# Patient Record
Sex: Female | Born: 1935 | Race: White | Hispanic: No | Smoking: Never smoker
Health system: Southern US, Community
[De-identification: ages and names within clinical notes are randomized; demographics above are authoritative.]

## PROBLEM LIST (undated history)

## (undated) DIAGNOSIS — T887XXA Unspecified adverse effect of drug or medicament, initial encounter: Secondary | ICD-10-CM

## (undated) DIAGNOSIS — Z800 Family history of malignant neoplasm of digestive organs: Secondary | ICD-10-CM

## (undated) DIAGNOSIS — J209 Acute bronchitis, unspecified: Secondary | ICD-10-CM

## (undated) DIAGNOSIS — K591 Functional diarrhea: Secondary | ICD-10-CM

## (undated) DIAGNOSIS — I498 Other specified cardiac arrhythmias: Secondary | ICD-10-CM

## (undated) DIAGNOSIS — E785 Hyperlipidemia, unspecified: Secondary | ICD-10-CM

## (undated) DIAGNOSIS — K219 Gastro-esophageal reflux disease without esophagitis: Secondary | ICD-10-CM

## (undated) DIAGNOSIS — K5730 Diverticulosis of large intestine without perforation or abscess without bleeding: Secondary | ICD-10-CM

## (undated) DIAGNOSIS — R112 Nausea with vomiting, unspecified: Secondary | ICD-10-CM

## (undated) DIAGNOSIS — E119 Type 2 diabetes mellitus without complications: Secondary | ICD-10-CM

## (undated) DIAGNOSIS — R000 Tachycardia, unspecified: Secondary | ICD-10-CM

## (undated) DIAGNOSIS — I10 Essential (primary) hypertension: Secondary | ICD-10-CM

## (undated) DIAGNOSIS — F3289 Other specified depressive episodes: Secondary | ICD-10-CM

## (undated) DIAGNOSIS — Z86010 Personal history of colonic polyps: Secondary | ICD-10-CM

## (undated) DIAGNOSIS — I4891 Unspecified atrial fibrillation: Secondary | ICD-10-CM

## (undated) DIAGNOSIS — R0789 Other chest pain: Secondary | ICD-10-CM

## (undated) DIAGNOSIS — N390 Urinary tract infection, site not specified: Secondary | ICD-10-CM

## (undated) DIAGNOSIS — Z9889 Other specified postprocedural states: Secondary | ICD-10-CM

## (undated) DIAGNOSIS — Z9289 Personal history of other medical treatment: Secondary | ICD-10-CM

## (undated) DIAGNOSIS — M069 Rheumatoid arthritis, unspecified: Secondary | ICD-10-CM

## (undated) DIAGNOSIS — D649 Anemia, unspecified: Secondary | ICD-10-CM

## (undated) DIAGNOSIS — F329 Major depressive disorder, single episode, unspecified: Secondary | ICD-10-CM

## (undated) DIAGNOSIS — K449 Diaphragmatic hernia without obstruction or gangrene: Secondary | ICD-10-CM

## (undated) DIAGNOSIS — E039 Hypothyroidism, unspecified: Secondary | ICD-10-CM

## (undated) HISTORY — PX: FOREARM FRACTURE SURGERY: SHX649

## (undated) HISTORY — DX: Acute bronchitis, unspecified: J20.9

## (undated) HISTORY — PX: ABDOMINAL HYSTERECTOMY: SHX81

## (undated) HISTORY — DX: Unspecified adverse effect of drug or medicament, initial encounter: T88.7XXA

## (undated) HISTORY — DX: Diverticulosis of large intestine without perforation or abscess without bleeding: K57.30

## (undated) HISTORY — DX: Hyperlipidemia, unspecified: E78.5

## (undated) HISTORY — DX: Anemia, unspecified: D64.9

## (undated) HISTORY — DX: Other specified cardiac arrhythmias: I49.8

## (undated) HISTORY — PX: FRACTURE SURGERY: SHX138

## (undated) HISTORY — DX: Essential (primary) hypertension: I10

## (undated) HISTORY — DX: Other chest pain: R07.89

## (undated) HISTORY — DX: Gastro-esophageal reflux disease without esophagitis: K21.9

## (undated) HISTORY — DX: Family history of malignant neoplasm of digestive organs: Z80.0

## (undated) HISTORY — DX: Other specified depressive episodes: F32.89

## (undated) HISTORY — DX: Tachycardia, unspecified: R00.0

## (undated) HISTORY — PX: CATARACT EXTRACTION: SUR2

## (undated) HISTORY — DX: Personal history of colonic polyps: Z86.010

## (undated) HISTORY — DX: Major depressive disorder, single episode, unspecified: F32.9

## (undated) HISTORY — DX: Functional diarrhea: K59.1

## (undated) HISTORY — DX: Hypothyroidism, unspecified: E03.9

## (undated) HISTORY — PX: THYROIDECTOMY, PARTIAL: SHX18

## (undated) HISTORY — DX: Diaphragmatic hernia without obstruction or gangrene: K44.9

---

## 1995-07-17 DIAGNOSIS — Z860100 Personal history of colon polyps, unspecified: Secondary | ICD-10-CM

## 1995-07-17 HISTORY — DX: Personal history of colonic polyps: Z86.010

## 1995-07-17 HISTORY — DX: Personal history of colon polyps, unspecified: Z86.0100

## 1998-10-21 ENCOUNTER — Inpatient Hospital Stay (HOSPITAL_COMMUNITY): Admission: EM | Admit: 1998-10-21 | Discharge: 1998-10-25 | Payer: Self-pay | Admitting: Gastroenterology

## 1999-12-04 ENCOUNTER — Other Ambulatory Visit: Admission: RE | Admit: 1999-12-04 | Discharge: 1999-12-04 | Payer: Self-pay | Admitting: Family Medicine

## 2000-10-30 ENCOUNTER — Other Ambulatory Visit: Admission: RE | Admit: 2000-10-30 | Discharge: 2000-10-30 | Payer: Self-pay | Admitting: Family Medicine

## 2000-12-24 ENCOUNTER — Encounter: Admission: RE | Admit: 2000-12-24 | Discharge: 2000-12-24 | Payer: Self-pay | Admitting: *Deleted

## 2000-12-24 ENCOUNTER — Encounter: Payer: Self-pay | Admitting: *Deleted

## 2003-06-10 ENCOUNTER — Encounter: Payer: Self-pay | Admitting: Emergency Medicine

## 2003-06-10 ENCOUNTER — Inpatient Hospital Stay (HOSPITAL_COMMUNITY): Admission: AD | Admit: 2003-06-10 | Discharge: 2003-06-11 | Payer: Self-pay | Admitting: Emergency Medicine

## 2003-06-10 ENCOUNTER — Encounter: Payer: Self-pay | Admitting: Internal Medicine

## 2004-08-03 ENCOUNTER — Ambulatory Visit: Payer: Self-pay | Admitting: Family Medicine

## 2005-02-14 ENCOUNTER — Other Ambulatory Visit: Admission: RE | Admit: 2005-02-14 | Discharge: 2005-02-14 | Payer: Self-pay | Admitting: Family Medicine

## 2005-02-14 ENCOUNTER — Ambulatory Visit: Payer: Self-pay | Admitting: Family Medicine

## 2005-02-14 LAB — CONVERTED CEMR LAB

## 2005-03-02 ENCOUNTER — Ambulatory Visit: Payer: Self-pay | Admitting: Internal Medicine

## 2005-03-15 ENCOUNTER — Ambulatory Visit: Payer: Self-pay | Admitting: Family Medicine

## 2005-07-11 ENCOUNTER — Ambulatory Visit: Payer: Self-pay | Admitting: Family Medicine

## 2005-09-25 ENCOUNTER — Ambulatory Visit: Payer: Self-pay | Admitting: Family Medicine

## 2005-10-24 ENCOUNTER — Ambulatory Visit: Payer: Self-pay | Admitting: Family Medicine

## 2006-02-27 ENCOUNTER — Ambulatory Visit: Payer: Self-pay | Admitting: Family Medicine

## 2006-07-02 ENCOUNTER — Ambulatory Visit: Payer: Self-pay | Admitting: Family Medicine

## 2006-07-19 ENCOUNTER — Ambulatory Visit: Payer: Self-pay | Admitting: Family Medicine

## 2006-08-20 ENCOUNTER — Ambulatory Visit: Payer: Self-pay | Admitting: Family Medicine

## 2006-10-01 ENCOUNTER — Ambulatory Visit: Payer: Self-pay | Admitting: Family Medicine

## 2006-10-01 LAB — CONVERTED CEMR LAB
Basophils Absolute: 0 10*3/uL (ref 0.0–0.1)
Basophils Relative: 0.1 % (ref 0.0–1.0)
Creatinine,U: 86.3 mg/dL
Eosinophil percent: 1.8 % (ref 0.0–5.0)
HCT: 39.3 % (ref 36.0–46.0)
Hgb A1c MFr Bld: 6.4 % — ABNORMAL HIGH (ref 4.6–6.0)
Lymphocytes Relative: 10.1 % — ABNORMAL LOW (ref 12.0–46.0)
MCV: 91.1 fL (ref 78.0–100.0)
Monocytes Relative: 8 % (ref 3.0–11.0)
Platelets: 294 10*3/uL (ref 150–400)
RBC: 4.32 M/uL (ref 3.87–5.11)
WBC: 7.2 10*3/uL (ref 4.5–10.5)

## 2007-02-20 ENCOUNTER — Ambulatory Visit: Payer: Self-pay | Admitting: Family Medicine

## 2007-02-27 ENCOUNTER — Ambulatory Visit: Payer: Self-pay | Admitting: Family Medicine

## 2007-02-27 LAB — CONVERTED CEMR LAB
Albumin: 3.2 g/dL — ABNORMAL LOW (ref 3.5–5.2)
Bilirubin, Direct: 0.1 mg/dL (ref 0.0–0.3)
CO2: 29 meq/L (ref 19–32)
Calcium: 9.2 mg/dL (ref 8.4–10.5)
Chloride: 101 meq/L (ref 96–112)
Creatinine, Ser: 0.7 mg/dL (ref 0.4–1.2)
Direct LDL: 59.2 mg/dL
Eosinophils Absolute: 0.2 10*3/uL (ref 0.0–0.6)
GFR calc non Af Amer: 86 mL/min
HDL: 58.9 mg/dL (ref >39.0)
Hgb A1c MFr Bld: 6.5 % — ABNORMAL HIGH (ref 4.6–6.0)
Lymphocytes Relative: 13.8 % (ref 12.0–46.0)
MCHC: 34.1 g/dL (ref 30.0–36.0)
Neutro Abs: 5.5 10*3/uL (ref 1.4–7.7)
Neutrophils Relative %: 78.8 % — ABNORMAL HIGH (ref 43.0–77.0)
Sodium: 137 meq/L (ref 135–145)
Total Bilirubin: 0.6 mg/dL (ref 0.3–1.2)
Total CHOL/HDL Ratio: 2.6
Total Protein: 6.9 g/dL (ref 6.0–8.3)
VLDL: 42 mg/dL — ABNORMAL HIGH (ref 0–40)
WBC: 7 10*3/uL (ref 4.5–10.5)

## 2007-03-06 ENCOUNTER — Other Ambulatory Visit: Admission: RE | Admit: 2007-03-06 | Discharge: 2007-03-06 | Payer: Self-pay | Admitting: Family Medicine

## 2007-03-06 ENCOUNTER — Encounter: Payer: Self-pay | Admitting: Family Medicine

## 2007-03-06 ENCOUNTER — Ambulatory Visit: Payer: Self-pay | Admitting: Family Medicine

## 2007-03-06 DIAGNOSIS — E785 Hyperlipidemia, unspecified: Secondary | ICD-10-CM | POA: Insufficient documentation

## 2007-03-06 DIAGNOSIS — E039 Hypothyroidism, unspecified: Secondary | ICD-10-CM

## 2007-03-06 DIAGNOSIS — M05742 Rheumatoid arthritis with rheumatoid factor of left hand without organ or systems involvement: Secondary | ICD-10-CM

## 2007-03-06 DIAGNOSIS — M05741 Rheumatoid arthritis with rheumatoid factor of right hand without organ or systems involvement: Secondary | ICD-10-CM

## 2007-03-06 DIAGNOSIS — E1169 Type 2 diabetes mellitus with other specified complication: Secondary | ICD-10-CM

## 2007-03-06 DIAGNOSIS — K5730 Diverticulosis of large intestine without perforation or abscess without bleeding: Secondary | ICD-10-CM | POA: Insufficient documentation

## 2007-03-06 DIAGNOSIS — I10 Essential (primary) hypertension: Secondary | ICD-10-CM | POA: Insufficient documentation

## 2007-03-06 LAB — CONVERTED CEMR LAB: Pap Smear: NEGATIVE

## 2007-03-11 LAB — CONVERTED CEMR LAB: Pap Smear: NORMAL

## 2007-04-03 ENCOUNTER — Encounter: Admission: RE | Admit: 2007-04-03 | Discharge: 2007-04-03 | Payer: Self-pay | Admitting: Family Medicine

## 2007-04-03 DIAGNOSIS — R930 Abnormal findings on diagnostic imaging of skull and head, not elsewhere classified: Secondary | ICD-10-CM

## 2007-04-14 ENCOUNTER — Ambulatory Visit: Payer: Self-pay | Admitting: Family Medicine

## 2007-04-24 ENCOUNTER — Encounter: Payer: Self-pay | Admitting: Family Medicine

## 2007-05-08 ENCOUNTER — Telehealth: Payer: Self-pay | Admitting: Family Medicine

## 2007-05-13 ENCOUNTER — Ambulatory Visit: Payer: Self-pay | Admitting: Family Medicine

## 2007-05-15 ENCOUNTER — Encounter: Payer: Self-pay | Admitting: Family Medicine

## 2007-06-16 ENCOUNTER — Encounter: Payer: Self-pay | Admitting: Family Medicine

## 2007-06-24 ENCOUNTER — Encounter: Payer: Self-pay | Admitting: Family Medicine

## 2007-06-24 ENCOUNTER — Telehealth: Payer: Self-pay | Admitting: Family Medicine

## 2007-07-15 ENCOUNTER — Telehealth: Payer: Self-pay | Admitting: Family Medicine

## 2007-07-16 ENCOUNTER — Telehealth: Payer: Self-pay | Admitting: Family Medicine

## 2007-07-16 ENCOUNTER — Ambulatory Visit: Payer: Self-pay | Admitting: Family Medicine

## 2007-07-16 DIAGNOSIS — T887XXA Unspecified adverse effect of drug or medicament, initial encounter: Secondary | ICD-10-CM | POA: Insufficient documentation

## 2007-07-18 ENCOUNTER — Encounter: Admission: RE | Admit: 2007-07-18 | Discharge: 2007-07-18 | Payer: Self-pay | Admitting: Family Medicine

## 2007-07-22 ENCOUNTER — Telehealth: Payer: Self-pay | Admitting: Family Medicine

## 2007-07-23 LAB — CONVERTED CEMR LAB
BUN: 15 mg/dL (ref 6–23)
Creatinine, Ser: 0.7 mg/dL (ref 0.4–1.2)

## 2007-08-01 ENCOUNTER — Ambulatory Visit: Payer: Self-pay | Admitting: Family Medicine

## 2007-09-29 ENCOUNTER — Encounter: Payer: Self-pay | Admitting: Family Medicine

## 2007-10-25 ENCOUNTER — Encounter: Payer: Self-pay | Admitting: Internal Medicine

## 2007-10-25 ENCOUNTER — Ambulatory Visit: Payer: Self-pay | Admitting: Internal Medicine

## 2007-10-25 ENCOUNTER — Observation Stay (HOSPITAL_COMMUNITY): Admission: EM | Admit: 2007-10-25 | Discharge: 2007-10-26 | Payer: Self-pay | Admitting: Emergency Medicine

## 2007-10-25 DIAGNOSIS — R0789 Other chest pain: Secondary | ICD-10-CM

## 2007-10-25 LAB — CONVERTED CEMR LAB
BUN: 16 mg/dL
CO2: 26 meq/L
Calcium: 8.8 mg/dL
Creatinine, Ser: 0.78 mg/dL
Platelets: 300 10*3/uL
Relative Index: 1.9
WBC: 10 10*3/uL

## 2007-10-27 ENCOUNTER — Ambulatory Visit: Payer: Self-pay | Admitting: Gastroenterology

## 2007-10-28 ENCOUNTER — Telehealth (INDEPENDENT_AMBULATORY_CARE_PROVIDER_SITE_OTHER): Payer: Self-pay | Admitting: *Deleted

## 2007-10-29 ENCOUNTER — Ambulatory Visit: Payer: Self-pay | Admitting: Family Medicine

## 2007-10-29 DIAGNOSIS — I498 Other specified cardiac arrhythmias: Secondary | ICD-10-CM

## 2007-10-29 DIAGNOSIS — R000 Tachycardia, unspecified: Secondary | ICD-10-CM

## 2007-10-30 ENCOUNTER — Telehealth: Payer: Self-pay | Admitting: Family Medicine

## 2007-11-04 ENCOUNTER — Telehealth: Payer: Self-pay | Admitting: Family Medicine

## 2007-11-11 ENCOUNTER — Ambulatory Visit: Payer: Self-pay

## 2007-11-11 ENCOUNTER — Encounter: Payer: Self-pay | Admitting: Family Medicine

## 2007-11-19 ENCOUNTER — Telehealth: Payer: Self-pay | Admitting: Family Medicine

## 2007-12-16 ENCOUNTER — Telehealth: Payer: Self-pay | Admitting: Family Medicine

## 2008-03-04 ENCOUNTER — Ambulatory Visit: Payer: Self-pay | Admitting: Family Medicine

## 2008-03-04 LAB — CONVERTED CEMR LAB
Bilirubin Urine: NEGATIVE
Nitrite: NEGATIVE
Specific Gravity, Urine: 1.02

## 2008-03-11 ENCOUNTER — Ambulatory Visit: Payer: Self-pay | Admitting: Family Medicine

## 2008-03-12 LAB — CONVERTED CEMR LAB
ALT: 12 units/L (ref 0–35)
Alkaline Phosphatase: 61 units/L (ref 39–117)
Basophils Absolute: 0 10*3/uL (ref 0.0–0.1)
Basophils Relative: 0.1 % (ref 0.0–1.0)
CO2: 26 meq/L (ref 19–32)
Chloride: 102 meq/L (ref 96–112)
Cholesterol: 154 mg/dL (ref 0–200)
Creatinine,U: 92.6 mg/dL
Eosinophils Absolute: 0.2 10*3/uL (ref 0.0–0.7)
GFR calc Af Amer: 89 mL/min
GFR calc non Af Amer: 74 mL/min
HDL: 57.5 mg/dL (ref >39.0)
LDL Cholesterol: 72 mg/dL (ref 0–99)
MCHC: 34.5 g/dL (ref 30.0–36.0)
MCV: 94 fL (ref 78.0–100.0)
Microalb Creat Ratio: 3.2 mg/g (ref 0.0–30.0)
Microalb, Ur: 0.3 mg/dL (ref 0.0–1.9)
Monocytes Absolute: 0.1 10*3/uL (ref 0.1–1.0)
Neutrophils Relative %: 84.6 % — ABNORMAL HIGH (ref 43.0–77.0)
Potassium: 3.9 meq/L (ref 3.5–5.1)
RBC: 3.84 M/uL — ABNORMAL LOW (ref 3.87–5.11)
TSH: 2.85 microintl units/mL (ref 0.35–5.50)
Total CHOL/HDL Ratio: 2.7
Triglycerides: 124 mg/dL (ref 0–149)
VLDL: 25 mg/dL (ref 0–40)
WBC: 6.6 10*3/uL (ref 4.5–10.5)

## 2008-03-31 ENCOUNTER — Telehealth: Payer: Self-pay | Admitting: Family Medicine

## 2008-05-17 ENCOUNTER — Encounter: Payer: Self-pay | Admitting: Family Medicine

## 2008-05-25 ENCOUNTER — Ambulatory Visit: Payer: Self-pay | Admitting: Gastroenterology

## 2008-06-07 ENCOUNTER — Ambulatory Visit: Payer: Self-pay | Admitting: Gastroenterology

## 2008-06-29 ENCOUNTER — Ambulatory Visit: Payer: Self-pay | Admitting: Family Medicine

## 2008-08-11 ENCOUNTER — Telehealth: Payer: Self-pay | Admitting: Family Medicine

## 2008-08-17 ENCOUNTER — Telehealth (INDEPENDENT_AMBULATORY_CARE_PROVIDER_SITE_OTHER): Payer: Self-pay | Admitting: *Deleted

## 2008-08-23 ENCOUNTER — Ambulatory Visit: Payer: Self-pay | Admitting: Family Medicine

## 2008-08-23 DIAGNOSIS — J209 Acute bronchitis, unspecified: Secondary | ICD-10-CM

## 2008-10-05 ENCOUNTER — Telehealth: Payer: Self-pay | Admitting: Family Medicine

## 2008-11-03 ENCOUNTER — Ambulatory Visit: Payer: Self-pay | Admitting: Family Medicine

## 2008-11-03 DIAGNOSIS — E1169 Type 2 diabetes mellitus with other specified complication: Secondary | ICD-10-CM

## 2008-11-03 LAB — CONVERTED CEMR LAB
Basophils Relative: 0 % (ref 0.0–3.0)
Eosinophils Absolute: 0.1 10*3/uL (ref 0.0–0.7)
Eosinophils Relative: 1.7 % (ref 0.0–5.0)
Glucose, Bld: 198 mg/dL — ABNORMAL HIGH (ref 70–99)
Lymphocytes Relative: 10.5 % — ABNORMAL LOW (ref 12.0–46.0)
MCHC: 33.8 g/dL (ref 30.0–36.0)
MCV: 94 fL (ref 78.0–100.0)
Monocytes Absolute: 0.3 10*3/uL (ref 0.1–1.0)
Monocytes Relative: 4.8 % (ref 3.0–12.0)
Neutrophils Relative %: 83 % — ABNORMAL HIGH (ref 43.0–77.0)
Platelets: 243 10*3/uL (ref 150–400)
RBC: 3.73 M/uL — ABNORMAL LOW (ref 3.87–5.11)

## 2008-12-09 ENCOUNTER — Telehealth: Payer: Self-pay | Admitting: Family Medicine

## 2009-01-04 ENCOUNTER — Telehealth: Payer: Self-pay | Admitting: Family Medicine

## 2009-01-20 ENCOUNTER — Telehealth: Payer: Self-pay | Admitting: Family Medicine

## 2009-02-25 ENCOUNTER — Encounter (HOSPITAL_BASED_OUTPATIENT_CLINIC_OR_DEPARTMENT_OTHER): Admission: RE | Admit: 2009-02-25 | Discharge: 2009-05-26 | Payer: Self-pay | Admitting: General Surgery

## 2009-03-31 ENCOUNTER — Ambulatory Visit: Payer: Self-pay | Admitting: Family Medicine

## 2009-03-31 DIAGNOSIS — K591 Functional diarrhea: Secondary | ICD-10-CM

## 2009-03-31 DIAGNOSIS — F329 Major depressive disorder, single episode, unspecified: Secondary | ICD-10-CM

## 2009-03-31 LAB — CONVERTED CEMR LAB
Bilirubin Urine: NEGATIVE
Blood in Urine, dipstick: NEGATIVE

## 2009-04-13 LAB — CONVERTED CEMR LAB
ALT: 18 units/L (ref 0–35)
Albumin: 3.7 g/dL (ref 3.5–5.2)
Alkaline Phosphatase: 56 units/L (ref 39–117)
Basophils Absolute: 0 10*3/uL (ref 0.0–0.1)
Calcium: 9 mg/dL (ref 8.4–10.5)
Cholesterol: 146 mg/dL (ref 0–200)
Creatinine, Ser: 0.8 mg/dL (ref 0.4–1.2)
Creatinine,U: 41 mg/dL
Eosinophils Absolute: 0.1 10*3/uL (ref 0.0–0.7)
GFR calc non Af Amer: 73.64 mL/min (ref >60)
Glucose, Bld: 138 mg/dL — ABNORMAL HIGH (ref 70–99)
HCT: 37.4 % (ref 36.0–46.0)
HDL: 74.2 mg/dL (ref >39.00)
Hemoglobin: 12.6 g/dL (ref 12.0–15.0)
Hgb A1c MFr Bld: 6.1 % (ref 4.6–6.5)
LDL Cholesterol: 55 mg/dL (ref 0–99)
Lymphocytes Relative: 10 % — ABNORMAL LOW (ref 12.0–46.0)
MCHC: 33.6 g/dL (ref 30.0–36.0)
Microalb Creat Ratio: 9.8 mg/g (ref 0.0–30.0)
RBC: 3.89 M/uL (ref 3.87–5.11)
Total Bilirubin: 0.8 mg/dL (ref 0.3–1.2)
Total CHOL/HDL Ratio: 2
Total Protein: 6.5 g/dL (ref 6.0–8.3)
Triglycerides: 85 mg/dL (ref 0.0–149.0)
WBC: 6.6 10*3/uL (ref 4.5–10.5)

## 2009-10-11 ENCOUNTER — Inpatient Hospital Stay (HOSPITAL_COMMUNITY): Admission: EM | Admit: 2009-10-11 | Discharge: 2009-10-14 | Payer: Self-pay | Admitting: Emergency Medicine

## 2010-05-23 ENCOUNTER — Encounter: Admission: RE | Admit: 2010-05-23 | Discharge: 2010-06-23 | Payer: Self-pay | Admitting: Rheumatology

## 2010-09-21 ENCOUNTER — Inpatient Hospital Stay (HOSPITAL_COMMUNITY)
Admission: EM | Admit: 2010-09-21 | Discharge: 2010-09-23 | Payer: Self-pay | Source: Home / Self Care | Attending: Internal Medicine | Admitting: Internal Medicine

## 2010-12-04 LAB — CBC
HCT: 31.7 % — ABNORMAL LOW (ref 36.0–46.0)
HCT: 35.1 % — ABNORMAL LOW (ref 36.0–46.0)
Hemoglobin: 10.3 g/dL — ABNORMAL LOW (ref 12.0–15.0)
Hemoglobin: 11.2 g/dL — ABNORMAL LOW (ref 12.0–15.0)
MCH: 29.8 pg (ref 26.0–34.0)
MCHC: 31.6 g/dL (ref 30.0–36.0)
MCV: 95.1 fL (ref 78.0–100.0)
Platelets: 226 10*3/uL (ref 150–400)
Platelets: 236 10*3/uL (ref 150–400)
Platelets: 258 10*3/uL (ref 150–400)
RBC: 3.33 MIL/uL — ABNORMAL LOW (ref 3.87–5.11)
RBC: 3.69 MIL/uL — ABNORMAL LOW (ref 3.87–5.11)
WBC: 4.9 10*3/uL (ref 4.0–10.5)
WBC: 8.4 10*3/uL (ref 4.0–10.5)

## 2010-12-04 LAB — BASIC METABOLIC PANEL
BUN: 16 mg/dL (ref 6–23)
CO2: 26 mEq/L (ref 19–32)
Chloride: 105 mEq/L (ref 96–112)
GFR calc Af Amer: >60 mL/min (ref >60)
GFR calc Af Amer: >60 mL/min (ref >60)
Glucose, Bld: 128 mg/dL — ABNORMAL HIGH (ref 70–99)
Glucose, Bld: 144 mg/dL — ABNORMAL HIGH (ref 70–99)
Sodium: 136 mEq/L (ref 135–145)

## 2010-12-04 LAB — URINE MICROSCOPIC-ADD ON

## 2010-12-04 LAB — DIFFERENTIAL
Basophils Absolute: 0 10*3/uL (ref 0.0–0.1)
Basophils Relative: 0 % (ref 0–1)
Eosinophils Relative: 1 % (ref 0–5)
Monocytes Absolute: 0.9 10*3/uL (ref 0.1–1.0)
Monocytes Relative: 11 % (ref 3–12)

## 2010-12-04 LAB — URINALYSIS, ROUTINE W REFLEX MICROSCOPIC
Nitrite: NEGATIVE
Urobilinogen, UA: 0.2 mg/dL (ref 0.0–1.0)
pH: 6 (ref 5.0–8.0)

## 2010-12-04 LAB — HEMOGLOBIN A1C
Hgb A1c MFr Bld: 6.2 % — ABNORMAL HIGH (ref <5.7)
Mean Plasma Glucose: 131 mg/dL — ABNORMAL HIGH (ref <117)

## 2010-12-04 LAB — GLUCOSE, CAPILLARY
Glucose-Capillary: 131 mg/dL — ABNORMAL HIGH (ref 70–99)
Glucose-Capillary: 132 mg/dL — ABNORMAL HIGH (ref 70–99)
Glucose-Capillary: 143 mg/dL — ABNORMAL HIGH (ref 70–99)
Glucose-Capillary: 184 mg/dL — ABNORMAL HIGH (ref 70–99)

## 2010-12-10 LAB — GLUCOSE, CAPILLARY
Glucose-Capillary: 125 mg/dL — ABNORMAL HIGH (ref 70–99)
Glucose-Capillary: 149 mg/dL — ABNORMAL HIGH (ref 70–99)
Glucose-Capillary: 157 mg/dL — ABNORMAL HIGH (ref 70–99)
Glucose-Capillary: 166 mg/dL — ABNORMAL HIGH (ref 70–99)
Glucose-Capillary: 191 mg/dL — ABNORMAL HIGH (ref 70–99)
Glucose-Capillary: 84 mg/dL (ref 70–99)

## 2010-12-10 LAB — CBC
HCT: 27.7 % — ABNORMAL LOW (ref 36.0–46.0)
HCT: 32.6 % — ABNORMAL LOW (ref 36.0–46.0)
Hemoglobin: 10.7 g/dL — ABNORMAL LOW (ref 12.0–15.0)
Hemoglobin: 9.2 g/dL — ABNORMAL LOW (ref 12.0–15.0)
MCV: 92.1 fL (ref 78.0–100.0)
Platelets: 237 10*3/uL (ref 150–400)
RBC: 3.54 MIL/uL — ABNORMAL LOW (ref 3.87–5.11)
WBC: 10.8 10*3/uL — ABNORMAL HIGH (ref 4.0–10.5)
WBC: 9.1 10*3/uL (ref 4.0–10.5)

## 2010-12-10 LAB — ANAEROBIC CULTURE

## 2010-12-10 LAB — DIFFERENTIAL
Eosinophils Absolute: 0 10*3/uL (ref 0.0–0.7)
Eosinophils Relative: 0 % (ref 0–5)
Lymphocytes Relative: 6 % — ABNORMAL LOW (ref 12–46)
Lymphs Abs: 0.7 10*3/uL (ref 0.7–4.0)

## 2010-12-10 LAB — BASIC METABOLIC PANEL
BUN: 13 mg/dL (ref 6–23)
Chloride: 101 mEq/L (ref 96–112)
GFR calc non Af Amer: >60 mL/min (ref >60)
Potassium: 3.8 mEq/L (ref 3.5–5.1)
Potassium: 3.9 mEq/L (ref 3.5–5.1)
Sodium: 133 mEq/L — ABNORMAL LOW (ref 135–145)

## 2010-12-10 LAB — WOUND CULTURE

## 2011-02-06 NOTE — Assessment & Plan Note (Signed)
[facility]   NAME:  [name], [name]           ACCOUNT NO.:  [id number]   MEDICAL RECORD NO.:  [id number]      DATE OF BIRTH:  June 03, 1929   PHYSICIAN:  Leonie Man, M.D.    VISIT DATE:  02/28/2009                                   OFFICE VISIT   HPI  This is the initial visit for this 77 year old patient with type 2  diabetes and rheumatoid arthritis.  She has had multiple rheumatoid  nodules of her hands, and sometime within the past week, she bumped one  of these nodules on some object and got a small ulceration on top of the  nodule of her right hand at the metacarpophalangeal joint.  It became  red and swollen.  She treated this with Polysporin and it has gotten  significantly better since that time.  She has self-referred to the  [facility] for further evaluation and treatment.   CURRENT MEDICATIONS:  1. Actoplus 15/500 b.i.d.  2. Hydroxychloroquine 200 mg b.i.d.  3. Glipizide 1 mg daily.  4. Zocor 40 mg daily.  5. Folic acid 1 mg daily.  6. Synthroid 1.5 mcg daily.  7. Premarin 0.625 mg daily.  8. Micardis 80/12.5 daily.  9. Celebrex 200 mg daily.  10.Methotrexate 2.5 mg once weekly.   She has no known allergies.   She has not had any recent surgical procedures; however, she has had  repair of a hammertoe, hysterectomy, and thyroidectomy in the remote  past.   SOCIAL HISTORY:  A married white female, speaks [place].  No tobacco,  alcohol, or illicit drug use history.   REVIEW OF SYSTEMS:  Negative except as outlined in the present illness  and past medical history.   PHYSICAL EXAMINATION:  Limited to the lesion on her right hand which is  a rheumatoid nodule at the MP joint.  There is a small superficial ulcer  which actually has an eschar formed over it and there is no surrounding  swelling, erythema, or drainage.   ASSESSMENT:  I think this lesion is well on its way to healing.   I will continue her on doxycycline 100 mg  b.i.d. for 10 days and put her  on triple antibiotic ointment.  We will follow up with her in 2 weeks.  The patient is to let if this clears up entirely in the next 2 weeks,  she may cancel her appointment; however, if this worsens in any way, she  should come back to see [place] earlier.      Leonie Man, M.D.  Electronically Signed     PB/MEDQ  D:  02/28/2009  T:  03/01/2009  Job:  [id number]

## 2011-02-06 NOTE — Assessment & Plan Note (Signed)
[facility]                                 ON-CALL NOTE   NAME:[name], Rhonda                    MRN:          [id number]  DATE:10/25/2007                            DOB:          [birth year]    TIME OF CALL:  10:13 a.m.   PHONE NUMBER:  [phone].   PRIMARY CARE PHYSICIAN:  Dianna Limbo, M.D.   CHIEF COMPLAINT:  Chest pain.   PROGRESS NOTE:  The call was taken by Jewel Baize at the Saturday  clinic at [facility]. The caller is complaining of chest pain with high blood  pressure and pulse. She was advised to go to the emergency room at [facility] for further evaluation.     Marne A. Tower, MD  Electronically Signed    MAT/MedQ  DD: 10/25/2007  DT: 10/26/2007  Job #: [id number]   cc:   Ellin Saba., MD

## 2011-02-06 NOTE — Discharge Summary (Signed)
[name], [name]           ACCOUNT NO.:  [id number]   MEDICAL RECORD NO.:  [id number]          PATIENT TYPE:  OBV   LOCATION:  1431                         FACILITY:  [facility]   PHYSICIAN:  Gordy Savers, MDDATE OF BIRTH:  17-Oct-1928   DATE OF ADMISSION:  10/25/2007  DATE OF DISCHARGE:  10/26/2007                               DISCHARGE SUMMARY   FINAL DIAGNOSIS:  Atypical chest pain.   ADDITIONAL DIAGNOSES:  1. Diabetes mellitus.  2. Hypertension.  3. Dyslipidemia.  4. Rheumatoid arthritis.   DISCHARGE MEDICATIONS:  1. Actos plus metformin 15/500 one twice daily.  2. Amaryl 4 mg once daily.  3. Folic acid 1 mg daily.  4. Methotrexate as directed.  5. Micardis/hydrochlorothiazide 80/12.5 daily.  6. Premarin 0.65 mg daily.  7. Synthroid 0.1 mg daily.  8. Zocor 80 mg daily.  9. Duratuss one every 12 hours.  10.Mucinex one every 12 hours.   HISTORY OF PRESENT ILLNESS:  The patient is a 77 year old white female  who noted the onset of lower anterior chest pain that awoke her from  sleep.  It was described as sharp and aggravated by deep inspiration  with some radiation to the back. There was no diaphoresis other  symptoms.  She has been coughing of late due to a URI  The patient also  noted her blood pressure and pulse rate to be slightly high, and this  actually concerned her more than the chest pain. Because of this, she  was evaluated in the ED and admitted to rule out acute coronary  insufficiency   LABORATORY DATA AND HOSPITAL COURSE:  The patient was admitted to  telemetry setting where she remained stable.  She had a CT angiogram of  the chest that was negative for acute pulmonary embolism.  Serial  cardiac enzymes were obtained and were negative. At time of discharge,  she was quite comfortable and only had minimal discomfort with deep  inspiration   DISPOSITION:  The patient was discharged pending performance of a 2-D  echocardiogram.  She has been  asked to follow up with her primary care  Kele Withem next week. In view her multiple risk factors, will be  considered for a Cardiolite stress test.      Gordy Savers, MD  Electronically Signed     PFK/MEDQ  D:  10/26/2007  T:  10/26/2007  Job:  [id number]

## 2011-02-09 NOTE — Consult Note (Signed)
NAME:  [name], [name]                     ACCOUNT NO.:  [id number]   MEDICAL RECORD NO.:  [id number]                   PATIENT TYPE:  EMS   LOCATION:  MAJO                                 FACILITY:  [facility]   PHYSICIAN:  Pramod P. Pearlean Brownie, MD                 DATE OF BIRTH:  1929-03-29   DATE OF CONSULTATION:  06/10/2003  DATE OF DISCHARGE:                                   CONSULTATION   REASON FOR CONSULTATION:  Seizure.   HISTORY OF PRESENT ILLNESS:  The patient is a 77 year old lady who  apparently had a witnessed generalized tonic clonic seizure while in sleep  earlier this morning.  The patient is unable to provide history, which is  obtained from her husband.  He states he woke up at about 6 in the morning  and noticed that his wife was trembling in all four extremities.  She had  her mouth clenched as well as had some mouth _________.  She was  unresponsive.  This lasted about three to four minutes, and then she stopped  the movements but continued to be unresponsive.  He called 911, and EMS got  there within five minutes.  The patient regained consciousness while in the  ambulance and appeared to be slightly disoriented and confused for a few  minutes but subsequently recovered quickly.  There was no focal extremity  weakness noted, tongue bite, or incontinence.  The patient did not have any  headache or postictal state.  There was no prior history of seizures, TIA,  stroke, migraine headaches, or significant neurological problems.  The  patient has not been recently started on any new medications, and she has  not been sick in the preceding one week with any illness.  She is a diabetic  but states she has been checking her blood glucose regularly and every day  in the morning she runs from 105-110.  When EMS checked her blood glucose in  the ambulance, it was 186 mg%.  The patient states she was up until 1 a.m.  last night but she did have a fair night's sleep.  She denies  any  significant recent stress or sleep deprivation.   PAST NEUROLOGICAL HISTORY:  Not significant for any seizures, including  febrile seizures, significant head injury, or loss of consciousness.   PAST MEDICAL HISTORY:  Significant for diabetes, rheumatoid arthritis,  hypothyroidism.  Hyperlipidemia.   CURRENT MEDICATIONS:  1. Glucophage.  2. Hydroxychloroquine.  3. Premarin.  4. Synthroid.  5. Folic acid.  6. Zocor.  7. Amaryl.  8. Remicade, which was recently stopped.   MEDICATION ALLERGIES:  None.   SOCIAL HISTORY:  She is married, living with her husband in [place].  She  does not smoke or drink.  She is retired.   REVIEW OF SYSTEMS:  Not significant for any recent cough, cold, flu,  diarrhea, chest pain, or any other illness.   PHYSICAL  EXAMINATION:  GENERAL:  A pleasant middle-aged lady, not in  distress.  VITAL SIGNS:  She is afebrile, pulse rate is 100 per minute and regular,  respiratory rate 16 per minute, temperature 97.5, blood pressure 130/90.  Distal pulses well felt.  SKIN:  There is no extremity rash or deformity noted.  HEENT:  Head is nontraumatic.  ENT exam is unremarkable.  NECK:  Supple without bruit.  CARDIAC:  No murmur, rub, or gallop.  CHEST:  Lungs clear to auscultation.  NEUROLOGIC:  She is awake, alert and oriented x3 with normal speech and  language function.  There is no aphasia, apraxia, or dysarthria.  Pupils are  equal, reactive.  Eye movements are full range without nystagmus.  Face is  symmetric, bilaterally moves normally, tongue is midline.  Motor system exam  reveals symmetrical upper and lower extremity strength, tone, reflexes,  except both knee and ankle jerks are depressed.  Plantar elucidation leads  to withdrawal response bilaterally.  Touch and pinprick sensation are  preserved.  Position, vibration also appear intact.  Finger-to-nose and knee-  to-heel coordination were slow but accurate.  Her gait was not tested.   CT  scan of the head non-contrast study was reviewed, showed bilateral white  matter hypodensities, which are consistent with microangiopathic changes.  No acute pathology or old infarcts are noted.   Blood chemistries including glucose, hemoglobin, hematocrit, pH, and PCO2  are normal.   Chest x-ray shows bibasilar atelectasis but no acute abnormality.   IMPRESSION:  A 77 year old lady with solitary episode of witnessed  generalized seizure in sleep without any obvious procreating factor.   PLAN:  I had a long discussion with the patient and her husband with regard  to each of her symptoms and discussed my plan for evaluation and treatment  and answered questions.  At the present time the patient is being admitted  to the medical service for further evaluation.  I would recommend obtaining  an MRI scan of the brain with and without contrast to rule out any brain  lesions as well as obtaining an EEG to look for epileptiform activity.  I  would also check hemoglobin A1C, TSH, B12, ANA, and ESR.  If no specific  abnormality is found on the above tests, I may elect to just follow her  conservatively as an outpatient at the office.  Chances of having a second  unprovoked seizure are about 50%; however, in the absence of any evidence to  show a high risk for recurrence, I may elect to start anticonvulsants at the  moment.  I discussed this with the patient and her husband, and they  understand the risks.  I have advised her to limit her driving until her  workup is completed.  Since the episode occurred in sleep, she may perhaps  be able to avoid during the day but I have advised her to avoid driving at  night as well as avoiding sleep deprivation.   Thank you for this referral.  I look forward to seeing her in follow-up.                                                Pramod P. Pearlean Brownie, MD    PPS/MEDQ  D:  06/10/2003  T:  06/11/2003  Job:  [id number]

## 2011-02-09 NOTE — Assessment & Plan Note (Signed)
[facility]                                 ON-CALL NOTE   NAME:Uram, RYE DORADO                  MRN:          [id number]  DATE:09/22/2006                            DOB:          [birth year]    This is a Dr. Scotty Court patient.   PHONE NUMBER:  [phone]   The patient has rheumatoid arthritis, hypertension, and controlled type  2 diabetes with no coronary artery disease, who awoke at about 2 a.m.  this morning in bed and felt very dizzy, and then had some nausea with  no associated pain, weakness, or numbness.  She is now able to sit up in  the chair, but still feels extremely nauseated.  She had a similar  episode to this a while back and had some nausea medicine and felt  better in a few days.  She has no fever, respiratory symptoms, or  cardiovascular symptoms.  Recommended she get someone to get some OTC  meclizine that she can trial, although may make her drowsy, may help her  with her nausea, and we can follow up in the office tomorrow or  Wednesday with Dr. Scotty Court as needed, but she will call back if any  other untoward symptoms occur.   ADDENDUM:  She also states that her blood sugar is under control and her  symptoms are not related to that.     Neta Mends. Panosh, MD  Electronically Signed    WKP/MedQ  DD: 09/22/2006  DT: 09/22/2006  Job #: [id number]

## 2011-02-09 NOTE — Discharge Summary (Signed)
NAME:  [name], [name] NO.:  [id number]   MEDICAL RECORD NO.:  [id number]                   PATIENT TYPE:  INP   LOCATION:  4707                                 FACILITY:  [facility]   PHYSICIAN:  Rene Paci, M.D. [facility]          DATE OF BIRTH:  12-26-1928   DATE OF ADMISSION:  06/10/2003  DATE OF DISCHARGE:  06/11/2003                                 DISCHARGE SUMMARY   DISCHARGE DIAGNOSIS:  Solitary seizure.   BRIEF HISTORY:  Ms. Kihn is a 77 year old white female who had seizure-  like activity on the morning of admission.  This was noted by her husband.  She was found to be shaking in her bed.  He was unable to wake her.  When he  did, she was confused with slurred speech.  EMS was called, and she was  found to be combative and confused.   PAST MEDICAL HISTORY:  1. Adult-onset diabetes mellitus.  2. Rheumatoid arthritis.  3. Hypothyroidism.  4. Hypercholesterolemia.  5. Hypertension.   HOSPITAL COURSE:  NEUROLOGIC:  The patient appeared to have a single seizure episode.  On  admission, her head CT revealed white matter disease, small vessel disease,  and old deep infarct but no acute abnormalities.  We did ask neurology to  see the patient.  The patient was seen in consultation by Dr. Pearlean Brownie.  He  recommended holding her anticonvulsants and obtaining MRA of the brain, EEG,  sed rate, and TSH.  EEG was normal with no epileptiform features.  MRA of  the brain revealed microangiopathic changes but no tumor or stroke.  His  impression was that this was a solitary unprovoked seizure during sleep  without any obvious triggers.  He felt the risk of recurrence was about 50%  in view of normal TEE and MRI.  He elected to hold off on the  anticonvulsants and follow as outpatient.  He felt she would need a sleep-  deprived EEG as an outpatient in two to three weeks, and he would follow up  with the patient in about four weeks.   DISCHARGE  LABORATORY DATA:  Hemoglobin 11.6.  Sed rate 12.  Coags normal.  Lipase 184.  BUN 11, creatinine 0.7.  Hemoglobin A1C 6.5%.  Fasting lipid  profile was normal.  TSH was 1.10.  B12 was 514.  Urinalysis was negative.   DISCHARGE MEDICATIONS:  1. Glucophage 500 mg b.i.d.  2. Premarin 0.625 mg daily.  3. Synthroid 125 mcg daily.  4. Folic acid 1 mg daily.  5. Zocor 80 mg daily.  6. Amaryl 4 mg daily.  7. Plaquenil 200 mg b.i.d.   She has been instructed to hold her Micardis and Lozol for now and bring all  of her medications with her when she sees Dr. Amador Cunas.   FOLLOW UP:  She has been instructed to follow up with Dr. Amador Cunas on  Wednesday, September 22, at 11:15 and Dr. Pearlean Brownie  in four weeks.      Cornell Barman, P.A. [facility]                  Rene Paci, M.D. [facility]    LC/MEDQ  D:  06/23/2003  T:  06/23/2003  Job:  [id number]   cc:   Dr. Scotty Court, [place]

## 2011-05-08 ENCOUNTER — Ambulatory Visit: Payer: Medicare Other | Attending: Internal Medicine

## 2011-05-08 DIAGNOSIS — M6281 Muscle weakness (generalized): Secondary | ICD-10-CM | POA: Insufficient documentation

## 2011-05-08 DIAGNOSIS — R262 Difficulty in walking, not elsewhere classified: Secondary | ICD-10-CM | POA: Insufficient documentation

## 2011-05-08 DIAGNOSIS — R269 Unspecified abnormalities of gait and mobility: Secondary | ICD-10-CM | POA: Insufficient documentation

## 2011-05-08 DIAGNOSIS — IMO0001 Reserved for inherently not codable concepts without codable children: Secondary | ICD-10-CM | POA: Insufficient documentation

## 2011-05-14 ENCOUNTER — Ambulatory Visit: Payer: Medicare Other

## 2011-05-17 ENCOUNTER — Ambulatory Visit: Payer: Medicare Other

## 2011-05-21 ENCOUNTER — Ambulatory Visit: Payer: Medicare Other

## 2011-05-24 ENCOUNTER — Ambulatory Visit: Payer: Medicare Other | Admitting: Physical Therapy

## 2011-05-29 ENCOUNTER — Ambulatory Visit: Payer: Medicare Other | Attending: Internal Medicine

## 2011-05-29 DIAGNOSIS — R262 Difficulty in walking, not elsewhere classified: Secondary | ICD-10-CM | POA: Insufficient documentation

## 2011-05-29 DIAGNOSIS — M6281 Muscle weakness (generalized): Secondary | ICD-10-CM | POA: Insufficient documentation

## 2011-05-29 DIAGNOSIS — R269 Unspecified abnormalities of gait and mobility: Secondary | ICD-10-CM | POA: Insufficient documentation

## 2011-05-29 DIAGNOSIS — IMO0001 Reserved for inherently not codable concepts without codable children: Secondary | ICD-10-CM | POA: Insufficient documentation

## 2011-05-31 ENCOUNTER — Ambulatory Visit: Payer: Medicare Other | Admitting: Physical Therapy

## 2011-06-01 ENCOUNTER — Inpatient Hospital Stay (HOSPITAL_COMMUNITY)
Admission: EM | Admit: 2011-06-01 | Discharge: 2011-06-02 | DRG: 378 | Disposition: A | Discharge status: Home / Self Care | Payer: Medicare Other | Attending: Family Medicine | Admitting: Family Medicine

## 2011-06-01 DIAGNOSIS — D62 Acute posthemorrhagic anemia: Secondary | ICD-10-CM | POA: Diagnosis present

## 2011-06-01 DIAGNOSIS — E039 Hypothyroidism, unspecified: Secondary | ICD-10-CM | POA: Diagnosis present

## 2011-06-01 DIAGNOSIS — I10 Essential (primary) hypertension: Secondary | ICD-10-CM | POA: Diagnosis present

## 2011-06-01 DIAGNOSIS — E119 Type 2 diabetes mellitus without complications: Secondary | ICD-10-CM | POA: Diagnosis present

## 2011-06-01 DIAGNOSIS — K5730 Diverticulosis of large intestine without perforation or abscess without bleeding: Secondary | ICD-10-CM

## 2011-06-01 DIAGNOSIS — K922 Gastrointestinal hemorrhage, unspecified: Secondary | ICD-10-CM

## 2011-06-01 DIAGNOSIS — M069 Rheumatoid arthritis, unspecified: Secondary | ICD-10-CM | POA: Diagnosis present

## 2011-06-01 DIAGNOSIS — E860 Dehydration: Secondary | ICD-10-CM | POA: Diagnosis present

## 2011-06-01 DIAGNOSIS — K5731 Diverticulosis of large intestine without perforation or abscess with bleeding: Principal | ICD-10-CM | POA: Diagnosis present

## 2011-06-01 LAB — POCT I-STAT, CHEM 8
Calcium, Ion: 1.23 mmol/L (ref 1.12–1.32)
Creatinine, Ser: 1.2 mg/dL — ABNORMAL HIGH (ref 0.50–1.10)
Glucose, Bld: 287 mg/dL — ABNORMAL HIGH (ref 70–99)
Hemoglobin: 11.2 g/dL — ABNORMAL LOW (ref 12.0–15.0)
Potassium: 4.1 mEq/L (ref 3.5–5.1)
TCO2: 24 mmol/L (ref 0–100)

## 2011-06-01 LAB — HEMOGLOBIN AND HEMATOCRIT, BLOOD
HCT: 28.9 % — ABNORMAL LOW (ref 36.0–46.0)
Hemoglobin: 9.4 g/dL — ABNORMAL LOW (ref 12.0–15.0)

## 2011-06-01 LAB — CK TOTAL AND CKMB (NOT AT ARMC): Relative Index: INVALID (ref 0.0–2.5)

## 2011-06-01 LAB — OCCULT BLOOD, POC DEVICE: Fecal Occult Bld: POSITIVE

## 2011-06-01 LAB — GLUCOSE, CAPILLARY: Glucose-Capillary: 149 mg/dL — ABNORMAL HIGH (ref 70–99)

## 2011-06-01 NOTE — H&P (Signed)
[name], [name] NO.:  [id number]  MEDICAL RECORD NO.:  [id number]  LOCATION:  WLED                         FACILITY:  [facility]  PHYSICIAN:  Talmage Nap, MD  DATE OF BIRTH:  Jun 09, 1929  DATE OF ADMISSION:  06/01/2011 DATE OF DISCHARGE:                             HISTORY & PHYSICAL   PRIMARY CARE PHYSICIAN:  Soyla Murphy. Renne Crigler, M.D.  PRIMARY ORTHOPEDIC SURGEON:  Leonides Grills, M.D.  PRIMARY GASTROENTEROLOGIST:  Dr. Vonita Moss of [facility].  History obtainable from the patient.  CHIEF COMPLAINT:  Bright red blood per rectum noticed early of this morning x4 episodes.  HISTORY:  The patient is an 77 year old Caucasian female with history of diverticulosis, also have deforming rheumatoid arthritis and diabetes mellitus, was said to have been in fairly stable health until early hours of this morning when she went to the bathroom and noticed blood in her stool.  She denied any tenderness most during defecation.  She denied any associated abdominal pain.  She denied any fever.  She denied any chills.  She denied any rigor.  She, however, claimed that she had 4 episodes of bloody stool and was feeling very dizzy, weak, and almost felt like fainting.  Hence, she presented to the emergency room.  She also denied any history of chest pain or shortness of breath.  In the emergency room, the patient was found to be guaiac-positive, borderline blood pressure, and subsequently started on IV fluids.  After evaluation, she was advised to be admitted for further workup.  PAST MEDICAL HISTORY: 1. Positive for hypertension. 2. Diabetes mellitus. 3. Deforming rheumatoid arthritis. 4. Hypothyroidism.  PAST SURGICAL HISTORY:  Colonoscopy status post polypectomy done well over 8 years ago and partial thyroidectomy and currently on hormone replacement therapy.  PREADMISSION MEDICATIONS: 1. ACTOplus/met 15/500 one p.o. daily. 2. Crestor 40 mg half a tablet p.o.  daily. 3. Folic acid 1 mg p.o. daily. 4. Synthroid 137 mcg p.o. daily. 5. Micardis/HCTZ 80/12.5 one p.o. b.i.d. 6. Celebrex 20 mg p.o. p.r.n. 7. Methotrexate 2.5 mg special dosing.  ALLERGIES:  She has no known allergies.  SOCIAL HISTORY:  Negative for tobacco use.  Takes wine every week and she is currently retired, and she is widowed.  FAMILY HISTORY:  Said to positive for rheumatoid arthritis and diabetes mellitus.  No history of colon CA.  REVIEW OF SYSTEMS:  The patient denies any history of headaches.  No blurry vision.  No nausea or vomiting.  Complained of dryness in the mouth.  No chest pain.  No shortness of breath.  Denies any cough.  No abdominal discomfort.  Has not had any hematochezia since being in the emergency room.  No dysuria or hematuria.  No swelling of the lower extremity.  No intolerance to heat or cold and no neuropsychiatric disorder.  PHYSICAL EXAMINATION:  GENERAL:  Very pleasant lady dehydrated, not in any obvious respiratory distress. VITAL SIGNS:  Present blood pressure is 109/46, pulse is 94, respiratory rate is 16, temperature is 97.4. HEENT: Pallor, but pupils are reactive to light and extraocular muscles are intact. NECK:  She has no jugular venous distention.  No carotid bruit.  No lymphadenopathy. CHEST:  Clear to auscultation. HEART:  S1 and S2. ABDOMEN: Soft, nontender.  Liver, spleen, kidney not palpable.  Bowel sounds are positive. EXTREMITIES:  Show no pedal edema. NEUROLOGIC EXAM:  Nonfocal. MUSCULOSKELETAL SYSTEM:  Showed deforming arthritis with ulnar deviation of the wrist and rheumatoid nodules at the wrist. SKIN:  Showed decreased turgor.  LABORATORY DATA:  Initial chemistry showed a sodium of 138, potassium of 4.1, chloride of 103,  BUN is 26, creatinine is 1.20, glucose is 287. Initial hemoglobin and hematocrit done was 11.2.  Fecal occult blood test positive.  Coagulation profile showed a PTT 30 and PT 13.8,  INR 1.04.  ADMITTING IMPRESSION: 1. Bright red blood per rectum, most likely secondary to diverticular     disease. 2. Dehydration. 3. Borderline blood pressure. 4. Diabetes mellitus. 5. Hypothyroidism. 6. History of diverticulosis. 7. Rheumatoid arthritis. 8. Deforming rheumatoid arthritis with nodules.  PLAN:  Is to admit the patient to general medical floor.  The patient will be slowly hydrated with normal saline IV to go at a rate of 100 cc an hour.  She will be on Protonix 40 mg IV q.24.  Blood pressure will be maintained with Micardis 80 mg p.o. daily and she will be restarted on Synthroid 137 mcg p.o. daily.  The patient also be on ACTOplus met 15/500 one p.o. daily followed by Accu-Cheks t.i.d. with a.c. and h.s. with regular insulin sliding scale.  She will be on TED stockings for DVT prophylaxis.  Further workup to be done on this patient will include cardiac enzymes q.6 x3, H and H q.6 hourly.  CBC, CMP, and magnesium will be repeated in a.m. and [place] [facility] will be consulted for further evaluation of this patient for possible colonoscopy.  The patient will be followed and evaluated on day-to-day basis.     Talmage Nap, MD     CN/MEDQ  D:  06/01/2011  T:  06/01/2011  Job:  [id number]  Electronically Signed by Talmage Nap  on 06/01/2011 07:05:00 PM

## 2011-06-02 DIAGNOSIS — K922 Gastrointestinal hemorrhage, unspecified: Secondary | ICD-10-CM

## 2011-06-02 DIAGNOSIS — K5730 Diverticulosis of large intestine without perforation or abscess without bleeding: Secondary | ICD-10-CM

## 2011-06-02 LAB — COMPREHENSIVE METABOLIC PANEL
ALT: 11 U/L (ref 0–35)
AST: 14 U/L (ref 0–37)
CO2: 27 mEq/L (ref 19–32)
Calcium: 8.2 mg/dL — ABNORMAL LOW (ref 8.4–10.5)
Sodium: 139 mEq/L (ref 135–145)
Total Protein: 5.4 g/dL — ABNORMAL LOW (ref 6.0–8.3)

## 2011-06-02 LAB — DIFFERENTIAL
Basophils Relative: 0 % (ref 0–1)
Eosinophils Absolute: 0.2 10*3/uL (ref 0.0–0.7)
Monocytes Relative: 8 % (ref 3–12)
Neutrophils Relative %: 68 % (ref 43–77)

## 2011-06-02 LAB — GLUCOSE, CAPILLARY: Glucose-Capillary: 113 mg/dL — ABNORMAL HIGH (ref 70–99)

## 2011-06-02 LAB — CBC
MCH: 30.5 pg (ref 26.0–34.0)
MCHC: 32.5 g/dL (ref 30.0–36.0)
Platelets: 236 10*3/uL (ref 150–400)
RBC: 2.82 MIL/uL — ABNORMAL LOW (ref 3.87–5.11)

## 2011-06-02 LAB — HEMOGLOBIN AND HEMATOCRIT, BLOOD: Hemoglobin: 8.7 g/dL — ABNORMAL LOW (ref 12.0–15.0)

## 2011-06-02 NOTE — Discharge Summary (Signed)
NAMECHRISTINA, [name] NO.:  [id number]  MEDICAL RECORD NO.:  [id number]  LOCATION:  1338                         FACILITY:  [facility]  PHYSICIAN:  Talmage Nap, MD  DATE OF BIRTH:  06/22/1929  DATE OF ADMISSION:  06/01/2011 DATE OF DISCHARGE:  06/02/2011                        DISCHARGE SUMMARY - REFERRING   PRIMARY CARE PHYSICIAN:  Dr. Soyla Murphy. Pharr  PRIMARY ORTHOPEDIC SURGEON:  Dr. Leonides Grills, M.D.  PRIMARY GASTROENTEROLOGIST:  Dr. Vonita Moss of [facility].  CONSULTANT INVOLVED IN THE CASE:  Gastroenterology, Dr. Erick Blinks, M.D.  DISCHARGE DIAGNOSES: 1. Bright red blood per rectum, most likely secondary to diverticular     bleed. 2. Anemia.  H and H stable at 8.7 g/dL. 3. Dehydration. 4. Hypertension. 5. Diabetes mellitus. 6. Hypothyroidism. 7. Rheumatoid arthritis. 8. Rheumatoid nodules.  The patient is an 77 year old Caucasian female with history of diverticulosis and deforming rheumatoid arthritis with nodules that was admitted to the hospital with painless hematochezia.  The patient claimed she had prior episode prior to presentation and thereafter, felt very dizzy and weak.  She denied any chest pain or shortness of breath. She also claims, she felt like fainting and subsequently presented to the emergency room to be evaluated.  PAST SURGICAL HISTORY:  Refer to my initial history and physical dictated by Dr. Talmage Nap, M.D.  PREADMISSION MEDS.:  Refer to my initial history and physical dictated by Dr. Talmage Nap, M.D.  ALLERGIES:  Refer to my initial history and physical dictated by Dr. Talmage Nap, M.D.  SOCIAL HISTORY:  Refer to my initial history and physical dictated by Dr. Talmage Nap, M.D.  FAMILY HISTORY:  Refer to my initial history and physical dictated by Dr. Talmage Nap, M.D.  PHYSICAL EXAMINATION:  GENERAL:  At time the patient was seen by me, very pleasant, dehydrated.  She was not  in any respiratory distress. VITAL SIGNS:  Blood pressure was 109/46, pulse 94, respiratory 16, AND temperature 97.4. HEENT:  Pallor, but pupils were reactive to light and extraocular muscles were intact. NECK:  She had no jugular venous distention.  No carotid bruit.  No lymphadenopathy. CHEST:  Clear to auscultation. HEART:  Sounds are one and two. ABDOMEN:  Soft, nontender.  Liver, spleen, and kidneys are not palpable. Sounds are positive. EXTREMITIES:  Showed no pedal edema. NEUROLOGIC EXAM:  Nonfocal. MUSCULOSKELETAL SYSTEM:  Showed rheumatoid arthritis with blunted deviation of the wrist and rheumatoid nodules at the wrist. SKIN:  Showed decreased turgor.  LAB DATA:  Fecal occult blood test positive.  Coagulation profile showed PT 13.8, INR 1.04, and a PTT of 30.  Cardiac marker, troponin-I less than 0.30.  Chem-8 stat showed ionized calcium of 1.23, hemoglobin 11.2, hematocrit 33.0, sodium is 138, potassium is 4.1, chloride is 103, glucose is 287, BUN is 26, and creatinine is 1.20.  Subsequent H and H done are as follows; 9.4, 8.6, and 8.7.  Complete blood count with differential done on June 02, 2011, showed WBC of 5.2, hemoglobin of 8.6, hematocrit of 26.5, MCV of 94.0 with a platelet count over 236 normal differential.  Comprehensive metabolic panel showed sodium of 139, potassium of 3.5, chloride of 106, with a bicarb of 27.  Glucose is 123, BUN is 16, creatinine 0.61, and magnesium level is 1.9.  HOSPITAL COURSE:  The patient was admitted to general medical floor. She was given normal saline IV to go at rate of 100 cc an hour.  She was also placed on Protonix 40 mg IV q. 24.  Blood pressure was maintained with Micardis 80 mg p.o. daily.  She was also given Synthroid 137 mcg p.o. daily and Actoplus Met 16/500, one p.o. daily.  She was also placed on Accu-Chek t.i.d. with a.c. h.s. with regular insulin sliding scale (moderate scale) and place stockings for DVT  prophylaxis.  The patient was evaluated by the in-house gastroenterologist, Dr. Rhea Belton, who at this time had recommended a nonsurgical intervention and that this present H and H according to him is stable, the patient could be discharged.  The patient was, however, seen by me today which is June 02, 2011, denied any hematochezia.  No fainting spells.  No chest pain or shortness of breath.  Examination of the patient was essentially unremarkable.  Her vital signs, blood pressure is 131/66, temperature is 97.4, pulse is 84, respiratory rate 20, and medically stable.  I have made multiple attempts to convince the patient to stay for 1 day, so we can monitor her H and H when she insisted on being discharged today.  The plan is for the patient to be discharged home today on activity as tolerated.  Low-sodium, low-cholesterol diet. Follow-up with her primary care physician in 1-2 weeks.  MEDICATIONS:  Medication to be taken at home include the following, 1. Pantoprazole 40 mg 1 p.o. daily 2. Actoplus Met 15/500 one tablet p.o. b.i.d. 3. Ascorbic acid 250 mg 1 p.o. daily. 4. Calcium carbonate/vitamin D 600 one p.o. daily. 5. Celebrex (celecoxib) 20 mg one p.o. daily p.r.n. 6. Folic acid 1 mg p.o. daily. 7. Amaryl (Amaryl) 1 mg half a tablet p.o. daily. 8. Hydroxychloroquine 20 mg 1 p.o. b.i.d. 9. Methotrexate 12.5 mg 10 tablets taken p.o. q. weekly on Sundays. 10.Micardis (telmisartan) 80 mg half a tablet p.o. daily. 11.Multivitamin 1 p.o. daily. 12.Synthroid 137 mcg 1 p.o. daily.     Talmage Nap, MD     CN/MEDQ  D:  06/02/2011  T:  06/02/2011  Job:  [id number]  cc:   Dr. Soyla Murphy. Pharr  Electronically Signed by Talmage Nap  on 06/02/2011 07:12:30 PM

## 2011-06-03 ENCOUNTER — Inpatient Hospital Stay (HOSPITAL_COMMUNITY)
Admission: AD | Admit: 2011-06-03 | Discharge: 2011-06-05 | DRG: 379 | Disposition: A | Discharge status: Home / Self Care | Payer: Medicare Other | Source: Ambulatory Visit | Attending: Internal Medicine | Admitting: Internal Medicine

## 2011-06-03 DIAGNOSIS — M1990 Unspecified osteoarthritis, unspecified site: Secondary | ICD-10-CM | POA: Diagnosis present

## 2011-06-03 DIAGNOSIS — K922 Gastrointestinal hemorrhage, unspecified: Secondary | ICD-10-CM

## 2011-06-03 DIAGNOSIS — I10 Essential (primary) hypertension: Secondary | ICD-10-CM | POA: Diagnosis present

## 2011-06-03 DIAGNOSIS — E039 Hypothyroidism, unspecified: Secondary | ICD-10-CM | POA: Diagnosis present

## 2011-06-03 DIAGNOSIS — E119 Type 2 diabetes mellitus without complications: Secondary | ICD-10-CM | POA: Diagnosis present

## 2011-06-03 DIAGNOSIS — F411 Generalized anxiety disorder: Secondary | ICD-10-CM | POA: Diagnosis present

## 2011-06-03 DIAGNOSIS — Z860100 Personal history of colon polyps, unspecified: Secondary | ICD-10-CM

## 2011-06-03 DIAGNOSIS — D649 Anemia, unspecified: Secondary | ICD-10-CM | POA: Diagnosis present

## 2011-06-03 DIAGNOSIS — K5731 Diverticulosis of large intestine without perforation or abscess with bleeding: Secondary | ICD-10-CM

## 2011-06-03 DIAGNOSIS — Z79899 Other long term (current) drug therapy: Secondary | ICD-10-CM

## 2011-06-03 DIAGNOSIS — M069 Rheumatoid arthritis, unspecified: Secondary | ICD-10-CM | POA: Diagnosis present

## 2011-06-03 LAB — CBC
MCHC: 32.4 g/dL (ref 30.0–36.0)
RDW: 16.5 % — ABNORMAL HIGH (ref 11.5–15.5)

## 2011-06-03 LAB — HEMOGLOBIN AND HEMATOCRIT, BLOOD
HCT: 25.8 % — ABNORMAL LOW (ref 36.0–46.0)
Hemoglobin: 8.1 g/dL — ABNORMAL LOW (ref 12.0–15.0)

## 2011-06-04 DIAGNOSIS — K922 Gastrointestinal hemorrhage, unspecified: Secondary | ICD-10-CM

## 2011-06-04 DIAGNOSIS — D62 Acute posthemorrhagic anemia: Secondary | ICD-10-CM

## 2011-06-04 LAB — TYPE AND SCREEN
ABO/RH(D): O POS
Antibody Screen: NEGATIVE
Unit division: 0
Unit division: 0

## 2011-06-04 LAB — PREPARE RBC (CROSSMATCH)

## 2011-06-04 LAB — GLUCOSE, CAPILLARY
Glucose-Capillary: 151 mg/dL — ABNORMAL HIGH (ref 70–99)
Glucose-Capillary: 144 mg/dL — ABNORMAL HIGH (ref 70–99)

## 2011-06-04 LAB — CBC
HCT: 28.5 % — ABNORMAL LOW (ref 36.0–46.0)
Hemoglobin: 9.3 g/dL — ABNORMAL LOW (ref 12.0–15.0)
MCH: 30.2 pg (ref 26.0–34.0)
MCHC: 32.6 g/dL (ref 30.0–36.0)
RDW: 16.9 % — ABNORMAL HIGH (ref 11.5–15.5)

## 2011-06-04 LAB — HEMOGLOBIN AND HEMATOCRIT, BLOOD: Hemoglobin: 8.4 g/dL — ABNORMAL LOW (ref 12.0–15.0)

## 2011-06-05 ENCOUNTER — Encounter: Payer: Self-pay | Admitting: Gastroenterology

## 2011-06-05 DIAGNOSIS — D62 Acute posthemorrhagic anemia: Secondary | ICD-10-CM

## 2011-06-05 DIAGNOSIS — K922 Gastrointestinal hemorrhage, unspecified: Secondary | ICD-10-CM

## 2011-06-05 LAB — CBC
Hemoglobin: 9.5 g/dL — ABNORMAL LOW (ref 12.0–15.0)
MCH: 30.2 pg (ref 26.0–34.0)
MCHC: 33 g/dL (ref 30.0–36.0)
MCV: 91.4 fL (ref 78.0–100.0)
RBC: 3.15 MIL/uL — ABNORMAL LOW (ref 3.87–5.11)

## 2011-06-05 LAB — GLUCOSE, CAPILLARY: Glucose-Capillary: 134 mg/dL — ABNORMAL HIGH (ref 70–99)

## 2011-06-06 ENCOUNTER — Telehealth: Payer: Self-pay | Admitting: Internal Medicine

## 2011-06-06 NOTE — Telephone Encounter (Signed)
Okay for pantoprazole 40 mg daily. Refill #11

## 2011-06-06 NOTE — Telephone Encounter (Signed)
Dr Rhea Belton, discharge notes list Pantoprazole 40mg  daily; ok to order?  Thanks.

## 2011-06-07 ENCOUNTER — Telehealth: Payer: Self-pay | Admitting: Gastroenterology

## 2011-06-07 LAB — CROSSMATCH
ABO/RH(D): O POS
Unit division: 0

## 2011-06-07 MED ORDER — PANTOPRAZOLE SODIUM 40 MG PO TBEC: DELAYED_RELEASE_TABLET | ORAL | Status: DC

## 2011-06-07 NOTE — Telephone Encounter (Signed)
Notified pt I will order Pantoprazole at [facility]; pt stated understanding.

## 2011-06-07 NOTE — Telephone Encounter (Signed)
Spoke with pt and informed her to avoid the pulp with OJ, she should be OK with V8, but it is acidic. Mailed pt cpoies of the diets. Pt stated understanding.

## 2011-06-07 NOTE — Telephone Encounter (Signed)
Addended by: Florene Glen on: 06/07/2011 08:39 AM   Modules accepted: Orders

## 2011-06-14 LAB — BASIC METABOLIC PANEL
BUN: 16
CO2: 26
Chloride: 101
Creatinine, Ser: 0.78
Glucose, Bld: 254 — ABNORMAL HIGH
Potassium: 3.9

## 2011-06-14 LAB — DIFFERENTIAL
Basophils Absolute: 0
Basophils Relative: 0
Eosinophils Absolute: 0.1
Eosinophils Relative: 1
Lymphs Abs: 0.3 — ABNORMAL LOW
Neutrophils Relative %: 92 — ABNORMAL HIGH

## 2011-06-14 LAB — CBC
HCT: 33.6 — ABNORMAL LOW
MCHC: 33.9
MCV: 90.6
Platelets: 300
RDW: 16.7 — ABNORMAL HIGH

## 2011-06-14 LAB — POCT CARDIAC MARKERS
Myoglobin, poc: 63.5
Operator id: 1211
Operator id: 3206
Troponin i, poc: <0.05

## 2011-06-15 LAB — CK: Total CK: 23

## 2011-06-15 LAB — TROPONIN I: Troponin I: 0.05

## 2011-06-16 NOTE — Discharge Summary (Addendum)
NAMEALISA, [name]           ACCOUNT NO.:  [id number]  MEDICAL RECORD NO.:  [id number]  LOCATION:  1610                         FACILITY:  [facility]  PHYSICIAN:  Hedwig Morton. Juanda Chance, MD     DATE OF BIRTH:  07-14-1929  DATE OF ADMISSION:  06/03/2011 DATE OF DISCHARGE:  06/05/2011                              DISCHARGE SUMMARY   PRIMARY GASTROENTEROLOGIST:  Vania Rea. Jarold Motto, MD, Clementeen Graham, FACP, FAGA  DISPOSITION:  Home in stable condition.  DISCHARGE MEDICATIONS:  Resume home medications.  The patient will continue home medications which include; 1. Methotrexate 2.5 mg p.o. q. week. 2. Celebrex 200 mg 1 cap daily as needed. 3. Synthroid 137 mcg p.o. daily. 4. Pantoprazole 40 mg daily. 5. Multiple vitamin 1 daily. 6. Micardis 80 mg take 1/2 tablet daily. 7. Hydroxychloroquine 200 mg 1 tablet twice daily. 8. Glimepiride 1 mg tablet, take 1/2 tablet daily. 9. Folic acid 1 mg daily. 10.Calcium carbonate/vitamin D 1 tablet daily. 11.Ascorbic acid 250 mg daily. 12.Actos plus metformin 15/500 mg 1 tablet twice daily.  CONSULTATIONS:  None requested.  PROCEDURES:  None performed this admission.  DISCHARGE DIAGNOSES: 1. Recurrent lower gastrointestinal bleed likely diverticular in     nature, resolved. 2. Normocytic anemia.  The patient's admitting hemoglobin was     essentially unchanged when she was recently discharged from the     hospital.  She was transfused a unit of packed red blood cells this     admission for hemoglobin of 8.8.  Hemoglobin stable, improved to     9.5 by discharge. 3. Rheumatoid arthritis, on treatment. 4. Diabetes, on oral hypoglycemic agents. 5. Hypothyroidism, on replacement therapy. 6. Hypertension.  HOSPITAL COURSE:  The patient is an 77 year old female known to Dr. Jarold Motto in our office for history of diverticular disease and colon polyps.  The patient had been admitted September 7 through September 8 for an acute diverticular bleed, at which time  her hemoglobin fell from 11.2 to 8.7.  Her bleeding had ceased by the time of admission, so her stay was brief.  Unfortunately, the patient had recurrent bleeding on the day of this admission on June 03, 2011.  She telephoned our office and was advised to return to the hospital for readmission. Upon admission,  her hemoglobin was 8.8, later in the afternoon it fell to 8.1, though patient had no further bleeding once admitted to the hospital.   On the second day of admission, her hemoglobin was stable at 8.4 but we elected to give her a unit of blood anyway. By June 05, 2011, the patient was ready for discharge.  She had not had any further bleeding and hemoglobin was up to 9.5 after transfusion.  Upon discharge, she was started on iron 3 times a day.  The patient was given a followup appointment with Dr. Jarold Motto, her primary gastroenterologist for October 2 at 9 a.m.  In the meantime, she will call for any recurrent bleeding.     Willette Cluster, NP   ______________________________ Hedwig Morton. Juanda Chance, MDPG/MEDQ  D:  06/05/2011  T:  06/05/2011  Job:  [id number]  Electronically Signed by Willette Cluster NP on 06/13/2011 11:48:57 AM Electronically Signed by Lina Sar MD  on 06/16/2011 11:44:00 PM Electronically Signed by Lina Sar MD on 06/16/2011 11:43:56 PM

## 2011-06-21 ENCOUNTER — Encounter: Payer: Self-pay | Admitting: *Deleted

## 2011-06-26 ENCOUNTER — Ambulatory Visit (INDEPENDENT_AMBULATORY_CARE_PROVIDER_SITE_OTHER): Payer: Medicare Other | Admitting: Gastroenterology

## 2011-06-26 ENCOUNTER — Encounter: Payer: Self-pay | Admitting: Gastroenterology

## 2011-06-26 VITALS — BP 120/56 | HR 100 | Ht 67.0 in | Wt 134.4 lb

## 2011-06-26 DIAGNOSIS — Z8719 Personal history of other diseases of the digestive system: Secondary | ICD-10-CM

## 2011-06-26 DIAGNOSIS — K922 Gastrointestinal hemorrhage, unspecified: Secondary | ICD-10-CM

## 2011-06-26 DIAGNOSIS — K5730 Diverticulosis of large intestine without perforation or abscess without bleeding: Secondary | ICD-10-CM

## 2011-06-26 DIAGNOSIS — K5790 Diverticulosis of intestine, part unspecified, without perforation or abscess without bleeding: Secondary | ICD-10-CM

## 2011-06-26 NOTE — Progress Notes (Signed)
History of Present Illness:  This is a 77 year old Caucasian female with a long history of recurrent diverticular hemorrhages, recently hospitalized x2 with lower GI bleeding from diverticulosis. She currently is asymptomatic and is on 3 times a day iron replacement. She is back to her regular diet and denies abdominal pain, constipation, upper GI or hepatobiliary or systemic symptoms. She does have rather severe rheumatoid arthritis and is on multiple medications. She denies NSAID use but was previously on Celebrex 200 mg a day which she has discontinued. Her main complaint today is her proximal muscle weakness in her lower extremities, she is in a physical therapy program. She does have chronic thyroid dysfunction and is on thyroid replacement therapy. Her rheumatologist is Dr. Dareen Piano [facility], and primary care doctor Merri Brunette.  I have reviewed this patient's present history, medical and surgical past history, allergies and medications.     ROS: The remainder of the 10 point ROS is negative     Physical Exam: Awake alert no acute distress appearing her stated age. Abdominal exam is benign without organomegaly, masses or tenderness. Chest is clear cardiac exam is unremarkable. There is no peripheral edema, phlebitis, or swollen joints. She has rather marked rheumatoid changes in her hands with ulnar deviation. Mental status is normal.  Assessment and plan: Recurrent diverticular hemorrhage confirmed by multiple colonoscopies. She is not on any coagulants at this time. I have asked her to continue iron replacement therapy, and she is scheduled for regular CBCs and iron studies at primary care. For her GERD she is to continue Protonix 40 mg a day. I have asked her to restart fiber diet with fiber supplements as needed. I also cautioned her to avoid aspirin and NSAIDs. Otherwise she is to continue all medications as per primary care as listed in her chart. He is to call us immediately  if she has recurrent lower GI bleeding. Do not think she needs followup colonoscopy at this time, last performed in 2009.  Encounter Diagnoses  Name Primary?  . Diverticulosis Yes  . Lower GI bleeding   . History of gastroesophageal reflux (GERD)

## 2011-06-26 NOTE — Patient Instructions (Signed)
Information on Diverticulosis given for you to review. We will obtain records from Dr. Carolee Rota office for Dr. Jarold Motto to review.

## 2011-06-27 LAB — GLUCOSE, CAPILLARY: Glucose-Capillary: 152 — ABNORMAL HIGH

## 2011-07-09 NOTE — H&P (Addendum)
Rhonda, Ryan           ACCOUNT NO.:  [id number]  MEDICAL RECORD NO.:  [id number]  LOCATION:  1610                         FACILITY:  [facility]  PHYSICIAN:  Erick Blinks, MD         DATE OF BIRTH:  1929-07-13  DATE OF ADMISSION:  06/03/2011 DATE OF DISCHARGE:                             HISTORY & PHYSICAL   PROBLEM:  GI bleeding.  HISTORY:  [name] is a pleasant 78 year old female, primary patient of Dr. Renne Crigler, is also known to Dr. Sheryn Bison; who has history of pan- diverticular disease and adenomatous colon polyps.  She had a remote diverticular bleed.  She was just admitted on June 01, 2011, through June 02, 2011, with an acute diverticular bleed and was discharged home yesterday afternoon after she had had no further bleeding for 24 hours.  Her hemoglobin had dropped from 11.2-8.7.  She had 3 episodes of bloody stools at home prior to admission and then had no further active bleeding while she was in the hospital, and did not require transfusion.  Unfortunately, she had onset again this morning about 6:00 a.m. with urge for bowel movement and then passed bright red blood in the commode. She says she had 3 episodes and felt somewhat lightheaded.  She called and was advised to return to the hospital, and at this time, is direct admitted with a stuttering diverticular bleed.  At this point, she has had no further bleeding since 7 a.m. and hemoglobin is 8.8 which is stable over the past 24 hours.  Her last colonoscopy was done in September 2009 showing pan-diverticular disease with multiple complex wide mouth tics throughout the colon; minimal evidence of proctitis; and diminutive polyp was removed, but not retrieved.  PAST HISTORY:  Pertinent for rheumatoid arthritis, osteoarthritis, adult- onset diabetes mellitus, hypothyroidism.  She is status post partial thyroidectomy and hypertension.  CURRENT MEDICATIONS: 1. Methotrexate 2.5 mg weekly. 2. She uses  Celebrex very rarely. 3. Actoplus 15/500 daily. 4. Crestor 40 mg daily. 5. Folic acid daily. 6. Vitamin C daily. 7. Synthroid 137 mcg daily. 8. Micardis/hydrochlorothiazide 80/12.5 daily.  ALLERGIES:  NO KNOWN DRUG ALLERGIES.  SOCIAL HISTORY:  The patient is widowed over the past 2 years, currently living alone.  She has one son who lives in [place], but travels during the week.  She is a nonsmoker, nondrinker.  Family history is pertinent for rheumatoid arthritis.  REVIEW OF SYSTEMS:  CONSTITUTIONAL:  Generally, she feels weak and a little bit lightheaded, but no dizziness.  CARDIOVASCULAR:  Denies any chest pain or anginal symptoms.  PULMONARY:  Denies any cough, shortness of breath, or sputum production.  GI: As outlined in HPI.  GU: Negative for dysuria, urgency, or frequency.  MUSCULOSKELETAL:  Positive for arthritic pain, primarily in her knees.  She has extensive arthritic changes in her hands, but says they do not generally hurt.  NEURO/PSYCH: Positive for anxiety.  All other review of systems negative except as outlined in the HPI.  PHYSICAL EXAMINATION:  GENERAL:  Well-developed, elderly, anxious white female in no acute distress. VITAL SIGNS:  Temperature is 97.4, blood pressure 131/66, pulse is 84, saturation is 99 on room air. HEENT: Nontraumatic, normocephalic.  EOMI,  PERRLA, sclerae are anicteric. NECK:  Supple.  There is no JVD. CARDIOVASCULAR:  Regular rate and rhythm with S1 and S2.  No murmur, rub, or gallop. PULMONARY:  Clear to A and P. ABDOMEN:  Soft.  Bowel sounds are active.  She is nontender.  There is no mass or hepatosplenomegaly. RECTAL:  Exam showed a small amount of bright red blood in the rectal vault. EXTREMITIES:  Without clubbing, cyanosis, or edema. SKIN:  Benign, warm, and dry. NEURO:  The patient is alert and oriented x3, and exam is grossly nonfocal. PSYCH:  Anxious, but mood and affect are appropriate.  LABORATORY STUDIES:  On  June 01, 2011; hemoglobin 11.2, hematocrit of 33.  Pro-time 13.8, INR of 1.04; and on September 9, WBC of 5.9, hemoglobin 8.8, hematocrit of 27.2, platelets 214.  Electrolytes are within normal limits.  IMPRESSION: 1. An 77 year old white female with stuttering lower gastrointestinal     bleed, felt to be diverticular in etiology. 2. Anemia secondary to acute blood loss, stable over the past 24     hours, but may equilibrate in drop. 3. Rheumatoid arthritis. 4. Hypothyroidism. 5. Adult-onset diabetes mellitus.  PLAN:  The patient is admitted to the service of Dr. Erick Blinks for IV fluid hydration, bedrest, clear liquid diet, serial H and H and transfusions as indicated for hemoglobin less than eight.  If she has further active bleeding today, we will obtain a stat. nuclear medicine bleeding scan.  For details, please see the orders.     Mike Gip, PA-C   ______________________________ Erick Blinks, MD    AE/MEDQ  D:  06/04/2011  T:  06/04/2011  Job:  [id number]  Electronically Signed by AMY ESTERWOOD PA-C on 06/12/2011 01:43:57 PM Electronically Signed by Erick Blinks MD on 07/09/2011 04:21:44 PM Electronically Signed by Erick Blinks MD on 07/09/2011 04:21:46 PM

## 2011-07-18 ENCOUNTER — Other Ambulatory Visit: Payer: Self-pay | Admitting: Gastroenterology

## 2011-07-18 MED ORDER — PANTOPRAZOLE SODIUM 40 MG PO TBEC: 40.0000 mg | DELAYED_RELEASE_TABLET | Freq: Every day | ORAL | Status: DC

## 2011-07-18 MED ORDER — FERROUS SULFATE 325 (65 FE) MG PO TABS: ORAL_TABLET | ORAL | Status: DC

## 2011-07-18 NOTE — Telephone Encounter (Signed)
Last OV 06/26/11 f/u Hospitalizations x 2 for lower GI bleeding from Diverticulosis. Instructions were: continue Iron replacement therapy with regular CBC's and Iron studies at PCP- Dr Merri Brunette.  Pt wants to know if she should continue iron?  Explained Dr Jarold Motto instructions as listed above. Pt then stated Dr Juanda Chance and Dr Rhea Belton wrote her scripts; informed pt I ordered Protonix and Iron for her at [facility]; pt stated understanding.

## 2011-07-23 ENCOUNTER — Telehealth: Payer: Self-pay | Admitting: Gastroenterology

## 2011-07-23 NOTE — Telephone Encounter (Signed)
Should be ok

## 2011-07-23 NOTE — Telephone Encounter (Signed)
Pt reports taking 2 antibiotics for a foot infection; Augmentin and Septra DS. Pt was hospitalized in September and has a hx of Diverticulosis and lower gi bleeding. Pt is concerned the antibiotics will cause her diverticulitis to flare up; Any advice? Thanks.

## 2011-07-24 NOTE — Telephone Encounter (Signed)
Informed pt that Dr Jarold Motto stated he thought she will be OK; call if problems develop. Pt stated understanding.

## 2011-07-24 NOTE — Telephone Encounter (Signed)
lmom for pt to call back

## 2011-08-03 ENCOUNTER — Telehealth: Payer: Self-pay | Admitting: Gastroenterology

## 2011-08-03 NOTE — Telephone Encounter (Signed)
Spoke with pt who stated she has 5 days left on her antibiotics, Augmentin and Septra DS, and she had watery stools x3 last pm and 1 this am. Pt is concerned she will develop a diverticular bleed again. Instructed pt to have Imodium on hand to use and if not effective or if she sees blood in her stool to call us. Informed pt we have a md on call 24/hrs daily x 7 days/week; pt stated understanding.

## 2011-10-03 DIAGNOSIS — E119 Type 2 diabetes mellitus without complications: Secondary | ICD-10-CM | POA: Diagnosis not present

## 2011-10-03 DIAGNOSIS — E7800 Pure hypercholesterolemia, unspecified: Secondary | ICD-10-CM | POA: Diagnosis not present

## 2011-10-03 DIAGNOSIS — D649 Anemia, unspecified: Secondary | ICD-10-CM | POA: Diagnosis not present

## 2011-10-03 DIAGNOSIS — D509 Iron deficiency anemia, unspecified: Secondary | ICD-10-CM | POA: Diagnosis not present

## 2011-10-05 DIAGNOSIS — M79609 Pain in unspecified limb: Secondary | ICD-10-CM | POA: Diagnosis not present

## 2011-10-05 DIAGNOSIS — B351 Tinea unguium: Secondary | ICD-10-CM | POA: Diagnosis not present

## 2011-10-05 DIAGNOSIS — L97509 Non-pressure chronic ulcer of other part of unspecified foot with unspecified severity: Secondary | ICD-10-CM | POA: Diagnosis not present

## 2011-10-08 ENCOUNTER — Ambulatory Visit: Payer: Medicare Other | Admitting: *Deleted

## 2011-10-08 DIAGNOSIS — E7800 Pure hypercholesterolemia, unspecified: Secondary | ICD-10-CM | POA: Diagnosis not present

## 2011-10-08 DIAGNOSIS — E119 Type 2 diabetes mellitus without complications: Secondary | ICD-10-CM | POA: Diagnosis not present

## 2011-10-08 DIAGNOSIS — I10 Essential (primary) hypertension: Secondary | ICD-10-CM | POA: Diagnosis not present

## 2011-10-11 ENCOUNTER — Ambulatory Visit: Payer: Medicare Other | Admitting: Occupational Therapy

## 2011-10-16 ENCOUNTER — Telehealth: Payer: Self-pay | Admitting: Gastroenterology

## 2011-10-16 NOTE — Telephone Encounter (Signed)
Pt was placed on pantoprazole 40mg  daily in September, 2012 while hospitalized for GI Bleed. Pt states before when she had indigestion, she would take a TUMS prn. Pt asks if she can stop the pantoprazole. Ok to stop; please advise. Thanks.

## 2011-10-18 ENCOUNTER — Ambulatory Visit: Payer: Medicare Other | Attending: Rheumatology | Admitting: Occupational Therapy

## 2011-10-18 DIAGNOSIS — IMO0001 Reserved for inherently not codable concepts without codable children: Secondary | ICD-10-CM | POA: Insufficient documentation

## 2011-10-18 DIAGNOSIS — M25549 Pain in joints of unspecified hand: Secondary | ICD-10-CM | POA: Diagnosis not present

## 2011-10-18 DIAGNOSIS — M6281 Muscle weakness (generalized): Secondary | ICD-10-CM | POA: Insufficient documentation

## 2011-10-22 DIAGNOSIS — R358 Other polyuria: Secondary | ICD-10-CM | POA: Diagnosis not present

## 2011-10-22 DIAGNOSIS — N390 Urinary tract infection, site not specified: Secondary | ICD-10-CM | POA: Diagnosis not present

## 2011-10-29 MED ORDER — PANTOPRAZOLE SODIUM 40 MG PO TBEC: 40.0000 mg | DELAYED_RELEASE_TABLET | Freq: Every day | ORAL | Status: DC

## 2011-10-29 NOTE — Telephone Encounter (Signed)
no

## 2011-10-29 NOTE — Telephone Encounter (Signed)
Informed pt Dr Jarold Motto wants her to remain on Pantoprazole; pt stated understanding.

## 2011-10-31 ENCOUNTER — Ambulatory Visit: Payer: Medicare Other | Attending: Rheumatology | Admitting: Occupational Therapy

## 2011-10-31 DIAGNOSIS — M25549 Pain in joints of unspecified hand: Secondary | ICD-10-CM | POA: Diagnosis not present

## 2011-10-31 DIAGNOSIS — IMO0001 Reserved for inherently not codable concepts without codable children: Secondary | ICD-10-CM | POA: Diagnosis not present

## 2011-10-31 DIAGNOSIS — M6281 Muscle weakness (generalized): Secondary | ICD-10-CM | POA: Diagnosis not present

## 2011-11-01 ENCOUNTER — Encounter: Payer: Medicare Other | Admitting: Occupational Therapy

## 2011-11-07 ENCOUNTER — Ambulatory Visit: Payer: Medicare Other | Admitting: Occupational Therapy

## 2011-11-07 ENCOUNTER — Encounter: Payer: Medicare Other | Admitting: Occupational Therapy

## 2011-11-07 DIAGNOSIS — M25549 Pain in joints of unspecified hand: Secondary | ICD-10-CM | POA: Diagnosis not present

## 2011-11-07 DIAGNOSIS — M6281 Muscle weakness (generalized): Secondary | ICD-10-CM | POA: Diagnosis not present

## 2011-11-07 DIAGNOSIS — IMO0001 Reserved for inherently not codable concepts without codable children: Secondary | ICD-10-CM | POA: Diagnosis not present

## 2011-11-12 ENCOUNTER — Encounter: Payer: Medicare Other | Admitting: Occupational Therapy

## 2011-11-12 ENCOUNTER — Ambulatory Visit: Payer: Medicare Other | Admitting: Occupational Therapy

## 2011-11-12 DIAGNOSIS — M25549 Pain in joints of unspecified hand: Secondary | ICD-10-CM | POA: Diagnosis not present

## 2011-11-12 DIAGNOSIS — M6281 Muscle weakness (generalized): Secondary | ICD-10-CM | POA: Diagnosis not present

## 2011-11-12 DIAGNOSIS — IMO0001 Reserved for inherently not codable concepts without codable children: Secondary | ICD-10-CM | POA: Diagnosis not present

## 2011-11-15 ENCOUNTER — Encounter: Payer: Medicare Other | Admitting: Occupational Therapy

## 2011-11-19 ENCOUNTER — Encounter: Payer: Medicare Other | Admitting: Occupational Therapy

## 2011-11-27 ENCOUNTER — Ambulatory Visit: Payer: Medicare Other | Attending: Rheumatology | Admitting: Occupational Therapy

## 2011-11-27 DIAGNOSIS — IMO0001 Reserved for inherently not codable concepts without codable children: Secondary | ICD-10-CM | POA: Insufficient documentation

## 2011-11-27 DIAGNOSIS — M6281 Muscle weakness (generalized): Secondary | ICD-10-CM | POA: Diagnosis not present

## 2011-11-27 DIAGNOSIS — M25549 Pain in joints of unspecified hand: Secondary | ICD-10-CM | POA: Diagnosis not present

## 2011-12-10 DIAGNOSIS — B351 Tinea unguium: Secondary | ICD-10-CM | POA: Diagnosis not present

## 2011-12-10 DIAGNOSIS — M79609 Pain in unspecified limb: Secondary | ICD-10-CM | POA: Diagnosis not present

## 2011-12-10 DIAGNOSIS — M216X9 Other acquired deformities of unspecified foot: Secondary | ICD-10-CM | POA: Diagnosis not present

## 2011-12-10 DIAGNOSIS — L97509 Non-pressure chronic ulcer of other part of unspecified foot with unspecified severity: Secondary | ICD-10-CM | POA: Diagnosis not present

## 2011-12-11 ENCOUNTER — Encounter: Payer: Medicare Other | Admitting: Occupational Therapy

## 2011-12-13 ENCOUNTER — Encounter: Payer: Medicare Other | Admitting: Occupational Therapy

## 2011-12-17 DIAGNOSIS — L97509 Non-pressure chronic ulcer of other part of unspecified foot with unspecified severity: Secondary | ICD-10-CM | POA: Diagnosis not present

## 2011-12-31 DIAGNOSIS — L97509 Non-pressure chronic ulcer of other part of unspecified foot with unspecified severity: Secondary | ICD-10-CM | POA: Diagnosis not present

## 2012-01-21 DIAGNOSIS — M25569 Pain in unspecified knee: Secondary | ICD-10-CM | POA: Diagnosis not present

## 2012-01-21 DIAGNOSIS — M1710 Unilateral primary osteoarthritis, unspecified knee: Secondary | ICD-10-CM | POA: Diagnosis not present

## 2012-01-21 DIAGNOSIS — M069 Rheumatoid arthritis, unspecified: Secondary | ICD-10-CM | POA: Diagnosis not present

## 2012-01-28 DIAGNOSIS — L97509 Non-pressure chronic ulcer of other part of unspecified foot with unspecified severity: Secondary | ICD-10-CM | POA: Diagnosis not present

## 2012-01-29 DIAGNOSIS — E7800 Pure hypercholesterolemia, unspecified: Secondary | ICD-10-CM | POA: Diagnosis not present

## 2012-01-29 DIAGNOSIS — E119 Type 2 diabetes mellitus without complications: Secondary | ICD-10-CM | POA: Diagnosis not present

## 2012-02-05 DIAGNOSIS — I10 Essential (primary) hypertension: Secondary | ICD-10-CM | POA: Diagnosis not present

## 2012-02-05 DIAGNOSIS — E119 Type 2 diabetes mellitus without complications: Secondary | ICD-10-CM | POA: Diagnosis not present

## 2012-02-05 DIAGNOSIS — E7800 Pure hypercholesterolemia, unspecified: Secondary | ICD-10-CM | POA: Diagnosis not present

## 2012-02-06 DIAGNOSIS — L97509 Non-pressure chronic ulcer of other part of unspecified foot with unspecified severity: Secondary | ICD-10-CM | POA: Diagnosis not present

## 2012-02-20 DIAGNOSIS — L97509 Non-pressure chronic ulcer of other part of unspecified foot with unspecified severity: Secondary | ICD-10-CM | POA: Diagnosis not present

## 2012-03-05 DIAGNOSIS — M1710 Unilateral primary osteoarthritis, unspecified knee: Secondary | ICD-10-CM | POA: Diagnosis not present

## 2012-03-10 DIAGNOSIS — M79609 Pain in unspecified limb: Secondary | ICD-10-CM | POA: Diagnosis not present

## 2012-03-10 DIAGNOSIS — L97509 Non-pressure chronic ulcer of other part of unspecified foot with unspecified severity: Secondary | ICD-10-CM | POA: Diagnosis not present

## 2012-03-10 DIAGNOSIS — B351 Tinea unguium: Secondary | ICD-10-CM | POA: Diagnosis not present

## 2012-03-18 DIAGNOSIS — H6120 Impacted cerumen, unspecified ear: Secondary | ICD-10-CM | POA: Diagnosis not present

## 2012-03-18 DIAGNOSIS — H9110 Presbycusis, unspecified ear: Secondary | ICD-10-CM | POA: Diagnosis not present

## 2012-03-25 DIAGNOSIS — E039 Hypothyroidism, unspecified: Secondary | ICD-10-CM | POA: Diagnosis not present

## 2012-03-25 DIAGNOSIS — R61 Generalized hyperhidrosis: Secondary | ICD-10-CM | POA: Diagnosis not present

## 2012-04-04 DIAGNOSIS — L97509 Non-pressure chronic ulcer of other part of unspecified foot with unspecified severity: Secondary | ICD-10-CM | POA: Diagnosis not present

## 2012-04-04 DIAGNOSIS — M79609 Pain in unspecified limb: Secondary | ICD-10-CM | POA: Diagnosis not present

## 2012-04-04 DIAGNOSIS — M216X9 Other acquired deformities of unspecified foot: Secondary | ICD-10-CM | POA: Diagnosis not present

## 2012-04-04 DIAGNOSIS — E1149 Type 2 diabetes mellitus with other diabetic neurological complication: Secondary | ICD-10-CM | POA: Diagnosis not present

## 2012-04-21 DIAGNOSIS — M069 Rheumatoid arthritis, unspecified: Secondary | ICD-10-CM | POA: Diagnosis not present

## 2012-04-21 DIAGNOSIS — M1710 Unilateral primary osteoarthritis, unspecified knee: Secondary | ICD-10-CM | POA: Diagnosis not present

## 2012-04-30 DIAGNOSIS — M1710 Unilateral primary osteoarthritis, unspecified knee: Secondary | ICD-10-CM | POA: Diagnosis not present

## 2012-05-05 DIAGNOSIS — E1149 Type 2 diabetes mellitus with other diabetic neurological complication: Secondary | ICD-10-CM | POA: Diagnosis not present

## 2012-05-05 DIAGNOSIS — L97509 Non-pressure chronic ulcer of other part of unspecified foot with unspecified severity: Secondary | ICD-10-CM | POA: Diagnosis not present

## 2012-05-05 DIAGNOSIS — M216X9 Other acquired deformities of unspecified foot: Secondary | ICD-10-CM | POA: Diagnosis not present

## 2012-05-05 DIAGNOSIS — M0500 Felty's syndrome, unspecified site: Secondary | ICD-10-CM | POA: Diagnosis not present

## 2012-05-06 ENCOUNTER — Other Ambulatory Visit: Payer: Self-pay | Admitting: Internal Medicine

## 2012-05-06 DIAGNOSIS — I10 Essential (primary) hypertension: Secondary | ICD-10-CM | POA: Diagnosis not present

## 2012-05-06 DIAGNOSIS — R300 Dysuria: Secondary | ICD-10-CM | POA: Diagnosis not present

## 2012-05-06 DIAGNOSIS — N390 Urinary tract infection, site not specified: Secondary | ICD-10-CM | POA: Diagnosis not present

## 2012-05-06 DIAGNOSIS — R911 Solitary pulmonary nodule: Secondary | ICD-10-CM

## 2012-05-06 DIAGNOSIS — R05 Cough: Secondary | ICD-10-CM | POA: Diagnosis not present

## 2012-05-06 DIAGNOSIS — E7800 Pure hypercholesterolemia, unspecified: Secondary | ICD-10-CM | POA: Diagnosis not present

## 2012-05-07 ENCOUNTER — Ambulatory Visit
Admission: RE | Admit: 2012-05-07 | Discharge: 2012-05-07 | Disposition: A | Discharge status: Home / Self Care | Payer: Medicare Other | Source: Ambulatory Visit | Attending: Internal Medicine | Admitting: Internal Medicine

## 2012-05-07 DIAGNOSIS — R911 Solitary pulmonary nodule: Secondary | ICD-10-CM | POA: Diagnosis not present

## 2012-05-07 DIAGNOSIS — J984 Other disorders of lung: Secondary | ICD-10-CM | POA: Diagnosis not present

## 2012-05-07 DIAGNOSIS — R05 Cough: Secondary | ICD-10-CM | POA: Diagnosis not present

## 2012-05-12 DIAGNOSIS — E119 Type 2 diabetes mellitus without complications: Secondary | ICD-10-CM | POA: Diagnosis not present

## 2012-05-12 DIAGNOSIS — N390 Urinary tract infection, site not specified: Secondary | ICD-10-CM | POA: Diagnosis not present

## 2012-05-12 DIAGNOSIS — I10 Essential (primary) hypertension: Secondary | ICD-10-CM | POA: Diagnosis not present

## 2012-05-12 DIAGNOSIS — E7800 Pure hypercholesterolemia, unspecified: Secondary | ICD-10-CM | POA: Diagnosis not present

## 2012-05-22 DIAGNOSIS — E7800 Pure hypercholesterolemia, unspecified: Secondary | ICD-10-CM | POA: Diagnosis not present

## 2012-05-22 DIAGNOSIS — E119 Type 2 diabetes mellitus without complications: Secondary | ICD-10-CM | POA: Diagnosis not present

## 2012-05-27 DIAGNOSIS — E7800 Pure hypercholesterolemia, unspecified: Secondary | ICD-10-CM | POA: Diagnosis not present

## 2012-05-27 DIAGNOSIS — E119 Type 2 diabetes mellitus without complications: Secondary | ICD-10-CM | POA: Diagnosis not present

## 2012-05-27 DIAGNOSIS — I10 Essential (primary) hypertension: Secondary | ICD-10-CM | POA: Diagnosis not present

## 2012-06-04 DIAGNOSIS — Z0000 Encounter for general adult medical examination without abnormal findings: Secondary | ICD-10-CM | POA: Diagnosis not present

## 2012-06-04 DIAGNOSIS — E7800 Pure hypercholesterolemia, unspecified: Secondary | ICD-10-CM | POA: Diagnosis not present

## 2012-06-04 DIAGNOSIS — E039 Hypothyroidism, unspecified: Secondary | ICD-10-CM | POA: Diagnosis not present

## 2012-06-09 DIAGNOSIS — E7800 Pure hypercholesterolemia, unspecified: Secondary | ICD-10-CM | POA: Diagnosis not present

## 2012-06-09 DIAGNOSIS — I10 Essential (primary) hypertension: Secondary | ICD-10-CM | POA: Diagnosis not present

## 2012-06-09 DIAGNOSIS — Z1212 Encounter for screening for malignant neoplasm of rectum: Secondary | ICD-10-CM | POA: Diagnosis not present

## 2012-06-09 DIAGNOSIS — E119 Type 2 diabetes mellitus without complications: Secondary | ICD-10-CM | POA: Diagnosis not present

## 2012-06-09 DIAGNOSIS — Z23 Encounter for immunization: Secondary | ICD-10-CM | POA: Diagnosis not present

## 2012-06-09 DIAGNOSIS — M069 Rheumatoid arthritis, unspecified: Secondary | ICD-10-CM | POA: Diagnosis not present

## 2012-06-24 DIAGNOSIS — Z0000 Encounter for general adult medical examination without abnormal findings: Secondary | ICD-10-CM | POA: Diagnosis not present

## 2012-06-25 DIAGNOSIS — M1710 Unilateral primary osteoarthritis, unspecified knee: Secondary | ICD-10-CM | POA: Diagnosis not present

## 2012-06-30 DIAGNOSIS — M216X9 Other acquired deformities of unspecified foot: Secondary | ICD-10-CM | POA: Diagnosis not present

## 2012-06-30 DIAGNOSIS — L97509 Non-pressure chronic ulcer of other part of unspecified foot with unspecified severity: Secondary | ICD-10-CM | POA: Diagnosis not present

## 2012-06-30 DIAGNOSIS — E1149 Type 2 diabetes mellitus with other diabetic neurological complication: Secondary | ICD-10-CM | POA: Diagnosis not present

## 2012-07-04 DIAGNOSIS — L97509 Non-pressure chronic ulcer of other part of unspecified foot with unspecified severity: Secondary | ICD-10-CM | POA: Diagnosis not present

## 2012-07-15 DIAGNOSIS — L97509 Non-pressure chronic ulcer of other part of unspecified foot with unspecified severity: Secondary | ICD-10-CM | POA: Diagnosis not present

## 2012-07-21 DIAGNOSIS — E789 Disorder of lipoprotein metabolism, unspecified: Secondary | ICD-10-CM | POA: Diagnosis not present

## 2012-07-21 DIAGNOSIS — E039 Hypothyroidism, unspecified: Secondary | ICD-10-CM | POA: Diagnosis not present

## 2012-07-21 DIAGNOSIS — E119 Type 2 diabetes mellitus without complications: Secondary | ICD-10-CM | POA: Diagnosis not present

## 2012-07-24 DIAGNOSIS — E039 Hypothyroidism, unspecified: Secondary | ICD-10-CM | POA: Diagnosis not present

## 2012-07-24 DIAGNOSIS — F411 Generalized anxiety disorder: Secondary | ICD-10-CM | POA: Diagnosis not present

## 2012-08-05 DIAGNOSIS — L97509 Non-pressure chronic ulcer of other part of unspecified foot with unspecified severity: Secondary | ICD-10-CM | POA: Diagnosis not present

## 2012-08-05 DIAGNOSIS — E1149 Type 2 diabetes mellitus with other diabetic neurological complication: Secondary | ICD-10-CM | POA: Diagnosis not present

## 2012-08-18 DIAGNOSIS — Z13820 Encounter for screening for osteoporosis: Secondary | ICD-10-CM | POA: Diagnosis not present

## 2012-08-18 DIAGNOSIS — M069 Rheumatoid arthritis, unspecified: Secondary | ICD-10-CM | POA: Diagnosis not present

## 2012-08-18 DIAGNOSIS — M1710 Unilateral primary osteoarthritis, unspecified knee: Secondary | ICD-10-CM | POA: Diagnosis not present

## 2012-08-18 DIAGNOSIS — M949 Disorder of cartilage, unspecified: Secondary | ICD-10-CM | POA: Diagnosis not present

## 2012-08-20 DIAGNOSIS — Z79899 Other long term (current) drug therapy: Secondary | ICD-10-CM | POA: Diagnosis not present

## 2012-08-26 DIAGNOSIS — F411 Generalized anxiety disorder: Secondary | ICD-10-CM | POA: Diagnosis not present

## 2012-08-28 ENCOUNTER — Emergency Department (HOSPITAL_COMMUNITY): Payer: Medicare Other

## 2012-08-28 ENCOUNTER — Inpatient Hospital Stay (HOSPITAL_COMMUNITY)
Admission: EM | Admit: 2012-08-28 | Discharge: 2012-09-01 | DRG: 482 | Disposition: A | Discharge status: Skilled Nursing Facility | Payer: Medicare Other | Attending: Internal Medicine | Admitting: Internal Medicine

## 2012-08-28 ENCOUNTER — Inpatient Hospital Stay (HOSPITAL_COMMUNITY): Payer: Medicare Other

## 2012-08-28 ENCOUNTER — Encounter (HOSPITAL_COMMUNITY): Payer: Self-pay | Admitting: Emergency Medicine

## 2012-08-28 DIAGNOSIS — S72409A Unspecified fracture of lower end of unspecified femur, initial encounter for closed fracture: Secondary | ICD-10-CM | POA: Diagnosis not present

## 2012-08-28 DIAGNOSIS — Y92009 Unspecified place in unspecified non-institutional (private) residence as the place of occurrence of the external cause: Secondary | ICD-10-CM

## 2012-08-28 DIAGNOSIS — E785 Hyperlipidemia, unspecified: Secondary | ICD-10-CM | POA: Diagnosis present

## 2012-08-28 DIAGNOSIS — E039 Hypothyroidism, unspecified: Secondary | ICD-10-CM | POA: Diagnosis present

## 2012-08-28 DIAGNOSIS — S72009D Fracture of unspecified part of neck of unspecified femur, subsequent encounter for closed fracture with routine healing: Secondary | ICD-10-CM | POA: Diagnosis not present

## 2012-08-28 DIAGNOSIS — S72009A Fracture of unspecified part of neck of unspecified femur, initial encounter for closed fracture: Secondary | ICD-10-CM

## 2012-08-28 DIAGNOSIS — W010XXA Fall on same level from slipping, tripping and stumbling without subsequent striking against object, initial encounter: Secondary | ICD-10-CM | POA: Diagnosis present

## 2012-08-28 DIAGNOSIS — E1169 Type 2 diabetes mellitus with other specified complication: Secondary | ICD-10-CM

## 2012-08-28 DIAGNOSIS — R296 Repeated falls: Secondary | ICD-10-CM | POA: Diagnosis not present

## 2012-08-28 DIAGNOSIS — S72143A Displaced intertrochanteric fracture of unspecified femur, initial encounter for closed fracture: Secondary | ICD-10-CM | POA: Diagnosis not present

## 2012-08-28 DIAGNOSIS — M069 Rheumatoid arthritis, unspecified: Secondary | ICD-10-CM | POA: Diagnosis present

## 2012-08-28 DIAGNOSIS — F329 Major depressive disorder, single episode, unspecified: Secondary | ICD-10-CM | POA: Diagnosis present

## 2012-08-28 DIAGNOSIS — K219 Gastro-esophageal reflux disease without esophagitis: Secondary | ICD-10-CM | POA: Diagnosis not present

## 2012-08-28 DIAGNOSIS — K449 Diaphragmatic hernia without obstruction or gangrene: Secondary | ICD-10-CM | POA: Diagnosis present

## 2012-08-28 DIAGNOSIS — F3289 Other specified depressive episodes: Secondary | ICD-10-CM | POA: Diagnosis present

## 2012-08-28 DIAGNOSIS — M79609 Pain in unspecified limb: Secondary | ICD-10-CM | POA: Diagnosis not present

## 2012-08-28 DIAGNOSIS — S72109A Unspecified trochanteric fracture of unspecified femur, initial encounter for closed fracture: Secondary | ICD-10-CM | POA: Diagnosis not present

## 2012-08-28 DIAGNOSIS — M6281 Muscle weakness (generalized): Secondary | ICD-10-CM | POA: Diagnosis not present

## 2012-08-28 DIAGNOSIS — M25559 Pain in unspecified hip: Secondary | ICD-10-CM | POA: Diagnosis not present

## 2012-08-28 DIAGNOSIS — Z01818 Encounter for other preprocedural examination: Secondary | ICD-10-CM | POA: Diagnosis not present

## 2012-08-28 DIAGNOSIS — Z79899 Other long term (current) drug therapy: Secondary | ICD-10-CM

## 2012-08-28 DIAGNOSIS — S72141A Displaced intertrochanteric fracture of right femur, initial encounter for closed fracture: Secondary | ICD-10-CM

## 2012-08-28 DIAGNOSIS — I10 Essential (primary) hypertension: Secondary | ICD-10-CM | POA: Diagnosis not present

## 2012-08-28 DIAGNOSIS — E119 Type 2 diabetes mellitus without complications: Secondary | ICD-10-CM | POA: Diagnosis not present

## 2012-08-28 DIAGNOSIS — R262 Difficulty in walking, not elsewhere classified: Secondary | ICD-10-CM | POA: Diagnosis not present

## 2012-08-28 DIAGNOSIS — R000 Tachycardia, unspecified: Secondary | ICD-10-CM

## 2012-08-28 DIAGNOSIS — R279 Unspecified lack of coordination: Secondary | ICD-10-CM | POA: Diagnosis not present

## 2012-08-28 DIAGNOSIS — D649 Anemia, unspecified: Secondary | ICD-10-CM | POA: Diagnosis not present

## 2012-08-28 DIAGNOSIS — Z4789 Encounter for other orthopedic aftercare: Secondary | ICD-10-CM | POA: Diagnosis not present

## 2012-08-28 DIAGNOSIS — R918 Other nonspecific abnormal finding of lung field: Secondary | ICD-10-CM | POA: Diagnosis not present

## 2012-08-28 DIAGNOSIS — S72001A Fracture of unspecified part of neck of right femur, initial encounter for closed fracture: Secondary | ICD-10-CM | POA: Diagnosis present

## 2012-08-28 DIAGNOSIS — IMO0002 Reserved for concepts with insufficient information to code with codable children: Secondary | ICD-10-CM | POA: Diagnosis not present

## 2012-08-28 HISTORY — DX: Other specified postprocedural states: Z98.890

## 2012-08-28 HISTORY — DX: Nausea with vomiting, unspecified: R11.2

## 2012-08-28 LAB — URINALYSIS, MICROSCOPIC ONLY
Glucose, UA: NEGATIVE mg/dL
Ketones, ur: NEGATIVE mg/dL
pH: 5.5 (ref 5.0–8.0)

## 2012-08-28 LAB — CBC WITH DIFFERENTIAL/PLATELET
Basophils Absolute: 0 10*3/uL (ref 0.0–0.1)
Basophils Relative: 0 % (ref 0–1)
Eosinophils Absolute: 0 10*3/uL (ref 0.0–0.7)
MCH: 30.7 pg (ref 26.0–34.0)
MCHC: 33.1 g/dL (ref 30.0–36.0)
Neutrophils Relative %: 90 % — ABNORMAL HIGH (ref 43–77)
Platelets: 266 10*3/uL (ref 150–400)
RDW: 15.6 % — ABNORMAL HIGH (ref 11.5–15.5)

## 2012-08-28 LAB — GLUCOSE, CAPILLARY
Glucose-Capillary: 159 mg/dL — ABNORMAL HIGH (ref 70–99)
Glucose-Capillary: 169 mg/dL — ABNORMAL HIGH (ref 70–99)
Glucose-Capillary: 235 mg/dL — ABNORMAL HIGH (ref 70–99)

## 2012-08-28 LAB — BASIC METABOLIC PANEL
BUN: 25 mg/dL — ABNORMAL HIGH (ref 6–23)
Calcium: 9.5 mg/dL (ref 8.4–10.5)
GFR calc non Af Amer: 82 mL/min — ABNORMAL LOW (ref >90)
Glucose, Bld: 204 mg/dL — ABNORMAL HIGH (ref 70–99)

## 2012-08-28 LAB — PROTIME-INR
INR: 1 (ref 0.00–1.49)
Prothrombin Time: 13.1 seconds (ref 11.6–15.2)

## 2012-08-28 MED ORDER — ONDANSETRON HCL 4 MG/2ML IJ SOLN
4.0000 mg | Freq: Once | INTRAMUSCULAR | Status: AC | PRN
  Administered 2012-08-28: 4 mg via INTRAVENOUS
  Filled 2012-08-28: qty 2

## 2012-08-28 MED ORDER — MORPHINE SULFATE 2 MG/ML IJ SOLN: 0.5000 mg | INTRAMUSCULAR | Status: DC | PRN

## 2012-08-28 MED ORDER — HYDRALAZINE HCL 20 MG/ML IJ SOLN: 10.0000 mg | INTRAMUSCULAR | Status: DC | PRN

## 2012-08-28 MED ORDER — LEVOTHYROXINE SODIUM 137 MCG PO TABS
137.0000 ug | ORAL_TABLET | Freq: Every day | ORAL | Status: DC
  Administered 2012-08-28 – 2012-09-01 (×5): 137 ug via ORAL
  Filled 2012-08-28 (×6): qty 1

## 2012-08-28 MED ORDER — HYDROCODONE-ACETAMINOPHEN 5-325 MG PO TABS
1.0000 | ORAL_TABLET | Freq: Four times a day (QID) | ORAL | Status: DC | PRN
  Administered 2012-08-28 – 2012-09-01 (×7): 1 via ORAL
  Filled 2012-08-28 (×8): qty 1

## 2012-08-28 MED ORDER — MORPHINE SULFATE 4 MG/ML IJ SOLN
4.0000 mg | INTRAMUSCULAR | Status: DC | PRN
  Administered 2012-08-28: 4 mg via INTRAVENOUS
  Filled 2012-08-28: qty 1

## 2012-08-28 MED ORDER — HYDROCODONE-ACETAMINOPHEN 5-325 MG PO TABS: 1.0000 | ORAL_TABLET | ORAL | Status: DC | PRN

## 2012-08-28 MED ORDER — SODIUM CHLORIDE 0.9 % IV SOLN
INTRAVENOUS | Status: DC
  Administered 2012-08-28 – 2012-08-29 (×2): 20 mL/h via INTRAVENOUS
  Administered 2012-08-30: 14:00:00 via INTRAVENOUS

## 2012-08-28 MED ORDER — POTASSIUM CHLORIDE IN NACL 20-0.45 MEQ/L-% IV SOLN
INTRAVENOUS | Status: DC
  Administered 2012-08-28: 09:00:00 via INTRAVENOUS
  Filled 2012-08-28: qty 1000

## 2012-08-28 MED ORDER — IBUPROFEN 200 MG PO TABS
400.0000 mg | ORAL_TABLET | Freq: Once | ORAL | Status: DC
  Filled 2012-08-28: qty 2

## 2012-08-28 MED ORDER — ONDANSETRON HCL 4 MG/2ML IJ SOLN: 4.0000 mg | Freq: Three times a day (TID) | INTRAMUSCULAR | Status: AC | PRN

## 2012-08-28 MED ORDER — ENOXAPARIN SODIUM 40 MG/0.4ML ~~LOC~~ SOLN
40.0000 mg | SUBCUTANEOUS | Status: DC
  Administered 2012-08-28 – 2012-08-29 (×2): 40 mg via SUBCUTANEOUS
  Filled 2012-08-28 (×3): qty 0.4

## 2012-08-28 MED ORDER — IRBESARTAN 150 MG PO TABS
150.0000 mg | ORAL_TABLET | Freq: Every day | ORAL | Status: DC
  Administered 2012-08-28 – 2012-09-01 (×4): 150 mg via ORAL
  Filled 2012-08-28 (×5): qty 1

## 2012-08-28 MED ORDER — INSULIN ASPART 100 UNIT/ML ~~LOC~~ SOLN
0.0000 [IU] | Freq: Three times a day (TID) | SUBCUTANEOUS | Status: DC
  Administered 2012-08-28: 3 [IU] via SUBCUTANEOUS
  Administered 2012-08-28 (×2): 2 [IU] via SUBCUTANEOUS
  Administered 2012-08-29: 5 [IU] via SUBCUTANEOUS
  Administered 2012-08-29 (×2): 2 [IU] via SUBCUTANEOUS
  Administered 2012-08-30: 1 [IU] via SUBCUTANEOUS
  Administered 2012-08-31 – 2012-09-01 (×4): 2 [IU] via SUBCUTANEOUS
  Administered 2012-09-01: 3 [IU] via SUBCUTANEOUS

## 2012-08-28 MED ORDER — METHOTREXATE (ANTI-RHEUMATIC) 2.5 MG PO TABS: 25.0000 mg | ORAL_TABLET | ORAL | Status: DC

## 2012-08-28 MED ORDER — GLIMEPIRIDE 1 MG PO TABS
0.5000 mg | ORAL_TABLET | Freq: Every day | ORAL | Status: DC
  Administered 2012-08-28 – 2012-09-01 (×4): 0.5 mg via ORAL
  Filled 2012-08-28 (×5): qty 0.5

## 2012-08-28 MED ORDER — METHOTREXATE 2.5 MG PO TABS: 25.0000 mg | ORAL_TABLET | ORAL | Status: DC

## 2012-08-28 MED ORDER — HYDROXYCHLOROQUINE SULFATE 200 MG PO TABS
200.0000 mg | ORAL_TABLET | Freq: Two times a day (BID) | ORAL | Status: DC
  Administered 2012-08-28 – 2012-09-01 (×8): 200 mg via ORAL
  Filled 2012-08-28 (×10): qty 1

## 2012-08-28 MED ORDER — GLUCERNA SHAKE PO LIQD
237.0000 mL | Freq: Two times a day (BID) | ORAL | Status: DC
  Administered 2012-08-28 – 2012-09-01 (×4): 237 mL via ORAL
  Filled 2012-08-28 (×9): qty 237

## 2012-08-28 MED ORDER — METFORMIN HCL 500 MG PO TABS
500.0000 mg | ORAL_TABLET | Freq: Two times a day (BID) | ORAL | Status: DC
  Administered 2012-08-28 – 2012-09-01 (×7): 500 mg via ORAL
  Filled 2012-08-28 (×10): qty 1

## 2012-08-28 NOTE — Progress Notes (Signed)
[facility] PROGRESS NOTE  CHAZLYN CUDE EAV:[id number] DOB: 15-Apr-1929 DOA: 08/28/2012 PCP: Londell Moh, MD  Assessment/Plan: Principal Problem:  *Closed right hip fracture: For surgery likely on Saturday, possibly tomorrow Active Problems:  HYPOTHYROIDISM: Continue Synthroid  DIABETES MELLITUS, TYPE II: Sliding scale plus home medications  HYPERLIPIDEMIA  HYPERTENSION: Continue Micardis Rheumatoid arthritis: Continue methotrexate plus Plaquenil  Code Status: Full code  Family Communication: Spoke with pt today.  I'll also be talking to her sons later  Disposition Plan: SNF possible Monday   Consultants:  Madelin Rear  Procedures:  None  Antibiotics:  None  HPI/Subjective: 78 year old white female past history diabetes mellitus, rheumatoid arthritis and hypothyroidism who presents after a mechanical fall and found to have right-sided hip fracture. Admitted to the hospitalist service with plans for orthopedic surgery either tomorrow or Saturday.  Patient herself doing okay. Moderate pain but fairly controlled with pain medication. Wants to have surgery done as she can.  Objective: Filed Vitals:   08/28/12 0600 08/28/12 0700 08/28/12 1048 08/28/12 1415  BP: 141/64 120/69 118/70 123/69  Pulse: 116 104 101 91  Temp:   99 F (37.2 C) 98.6 F (37 C)  TempSrc:   Oral Oral  Resp: 19 20 18 18   Height:  5\' 6"  (1.676 m)    Weight:  61.689 kg (136 lb)    SpO2: 95% 97% 97% 97%    Intake/Output Summary (Last 24 hours) at 08/28/12 1425 Last data filed at 08/28/12 1400  Gross per 24 hour  Intake    905 ml  Output      0 ml  Net    905 ml   Filed Weights   08/28/12 0700  Weight: 61.689 kg (136 lb)    Exam:   General:  Alert and oriented x3, no acute distress  Cardiovascular: Regular rate and rhythm, S1-S2  Respiratory: Clear to auscultation bilaterally  Abdomen: Soft, nontender, nondistended, positive bowel sounds  Extremities: No  clubbing or cyanosis or edema  Data Reviewed: Basic Metabolic Panel:  Lab 08/28/12 7829  NA 137  K 3.7  CL 102  CO2 23  GLUCOSE 204*  BUN 25*  CREATININE 0.63  CALCIUM 9.5  MG --  PHOS --   CBC:  Lab 08/28/12 0444  WBC 13.7*  NEUTROABS 12.4*  HGB 12.0  HCT 36.2  MCV 92.6  PLT 266   CBG:  Lab 08/28/12 1135 08/28/12 0739  GLUCAP 235* 200*      Studies: Dg Chest 1 View  08/28/2012 IMPRESSION: Minimal left basilar airspace opacity may reflect atelectasis or scarring; lungs otherwise clear.  No displaced rib fractures seen.   Original Report Authenticated By: Tonia Ghent, M.D.    Dg Hip Complete Right  08/28/2012   IMPRESSION: Mildly displaced comminuted right femoral intertrochanteric fracture, with a displaced lesser trochanteric fragment.   Original Report Authenticated By: Tonia Ghent, M.D.    Dg Knee Complete 4 Views Right  08/28/2012   IMPRESSION:  1.  No evidence of fracture or dislocation. 2.  Tricompartmental osteoarthritis noted. 3.  Trace knee joint effusion seen. 4.  Diffuse vascular calcifications noted.   Original Report Authenticated By: Tonia Ghent, M.D.     Scheduled Meds:   . enoxaparin (LOVENOX) injection  40 mg Subcutaneous Q24H  . feeding supplement  237 mL Oral BID BM  . glimepiride  0.5 mg Oral Daily  . hydroxychloroquine  200 mg Oral BID  . insulin aspart  0-9 Units Subcutaneous TID WC  . irbesartan  150 mg Oral Daily  . levothyroxine  137 mcg Oral QAC breakfast  . metFORMIN  500 mg Oral BID  . methotrexate  25 mg Oral Weekly  . [DISCONTINUED] ibuprofen  400 mg Oral Once  . [DISCONTINUED] methotrexate  25 mg Oral Weekly   Continuous Infusions:   . sodium chloride    . [DISCONTINUED] 0.45 % NaCl with KCl 20 mEq / L 100 mL/hr at 08/28/12 4098       Time spent: 20 minutes    Hollice Espy  [facility] Pager [phone]. If 8PM-8AM, please contact night-coverage at [email or web address], password [facility] 08/28/2012, 2:25 PM   LOS: 0 days

## 2012-08-28 NOTE — ED Notes (Signed)
Attempted to call report to floor RN

## 2012-08-28 NOTE — Progress Notes (Signed)
Utilization review completed.  

## 2012-08-28 NOTE — Progress Notes (Signed)
Clinical Social Work Department BRIEF PSYCHOSOCIAL ASSESSMENT 08/28/2012  Patient:  [name], [name]     Account Number:  [id number]     Admit date:  08/28/2012  Clinical Social Worker:  Candie Chroman  Date/Time:  08/28/2012 04:10 PM  Referred by:  Physician  Date Referred:  08/28/2012 Referred for  SNF Placement   Other Referral:   Interview type:  Patient Other interview type:    PSYCHOSOCIAL DATA Living Status:  ALONE Admitted from facility:   Level of care:   Primary support name:  Saunders Glance Primary support relationship to patient:  CHILD, ADULT Degree of support available:   supportive    CURRENT CONCERNS Current Concerns  Post-Acute Placement   Other Concerns:    SOCIAL WORK ASSESSMENT / PLAN Pt is an 78 yr old female living at home prior to hospitalization. CSW met with pt to assist with d/c planning. Pt will have hip surgery and require ST Rehab following hospital d/c. Pt is in agreement with this plan. SNF search has been initiated and bed offers will be provided as received.   Assessment/plan status:  Psychosocial Support/Ongoing Assessment of Needs Other assessment/ plan:   Information/referral to community resources:   SNF list    PATIENT'S/FAMILY'S RESPONSE TO PLAN OF CARE: Pt is looking forward to having her surgery to decrease pain. She is willing to consider ST Rehab when stable.    Cori Razor LCSW [phone]

## 2012-08-28 NOTE — ED Notes (Signed)
JXB:[id number]<GN> Expected date:08/28/12<BR> Expected time: 1:39 AM<BR> Means of arrival:Ambulance<BR> Comments:<BR> Fall/hip pain

## 2012-08-28 NOTE — Progress Notes (Signed)
Clinical Social Work Department CLINICAL SOCIAL WORK PLACEMENT NOTE 08/28/2012  Patient:  MAHEALANI, SULAK  Account Number:  [id number] Admit date:  08/28/2012  Clinical Social Worker:  Cori Razor, LCSW  Date/time:  08/28/2012 04:17 PM  Clinical Social Work is seeking post-discharge placement for this patient at the following level of care:   SKILLED NURSING   (*CSW will update this form in Epic as items are completed)   08/28/2012  Patient/family provided with [facility] Department of Clinical Social Work's list of facilities offering this level of care within the geographic area requested by the patient (or if unable, by the patient's family).  08/28/2012  Patient/family informed of their freedom to choose among providers that offer the needed level of care, that participate in Medicare, Medicaid or managed care program needed by the patient, have an available bed and are willing to accept the patient.    Patient/family informed of [facility]' ownership interest in [facility], as well as of the fact that they are under no obligation to receive care at this facility.  PASARR submitted to EDS on 08/28/2012 PASARR number received from [employer]DS on   FL2 transmitted to all facilities in geographic area requested by pt/family on  08/28/2012 FL2 transmitted to all facilities within larger geographic area on   Patient informed that his/her managed care company has contracts with or will negotiate with  certain facilities, including the following:     Patient/family informed of bed offers received:   Patient chooses bed at  Physician recommends and patient chooses bed at    Patient to be transferred to  on   Patient to be transferred to facility by   The following physician request were entered in Epic:   Additional Comments:  Cori Razor LCSW [phone]

## 2012-08-28 NOTE — Progress Notes (Addendum)
Nutrition Consult Note  Body mass index is 21.95 kg/(m^2). Pt meets criteria for normal healthy weight based on current BMI.   Current diet order is CHO modified, patient is consuming approximately 50-75% of meals at this time. Labs and medications reviewed. Pt reports eating well PTA, 3 meals/day, drinking Glucerna shakes on/off. Pt reports good appetite. Pt reports stable weight PTA. Pt interested in diabetic diet education - used teach back method to educate pt on diabetic diet and provided handouts of this information.   Will order Glucerna shakes BID. No further nutrition interventions warranted at this time. If nutrition issues arise, please consult RD.   Levon Hedger MS, RD, LDN [phone] Pager [phone] After Hours Pager

## 2012-08-28 NOTE — Consult Note (Signed)
Reason for Consult: Right hip fracture  Referring Physician: ER  [name] is an 78 y.o. female.  HPI: 78 year-old female who was brought to the ER after patient had sustained a fall at her house. Patient states that she was sleepy and she got up using her cain in one hand and carrying a plate in the other. She states that she tripped and fell, landing on her right hip causing instant pain.  She called her neighbors and was brought to the ER by EMS. X-rays reveal fracture of the right femoral intertrochanteric. She is a long time patient of Dr. Charlann Boxer and has been seen in the office recently for treatment of bilateral knee OA.  She is admitted by medicine for medical management. Patient denies any chest pain shortness of breath or any dizziness or palpitations. She was seen and the case discussed with her. Risks, benefits and expectations were discussed with the patient. Patient understand the risks, benefits and expectations and wishes to proceed with surgery to fix the right hip.    Past Medical History  Diagnosis Date  . Diverticulosis of colon (without mention of hemorrhage)   . Esophageal reflux   . Hiatal hernia   . Depressive disorder, not elsewhere classified   . Functional diarrhea   . Type II or unspecified type diabetes mellitus with other specified manifestations, not stated as uncontrolled   . Anemia, unspecified   . Acute bronchitis   . Tachycardia, unspecified   . Other specified cardiac dysrhythmias   . Other chest pain   . Unspecified adverse effect of unspecified drug, medicinal and biological substance   . Rheumatoid arthritis   . Unspecified hypothyroidism   . Unspecified essential hypertension   . Other and unspecified hyperlipidemia   . Family history of malignant neoplasm of gastrointestinal tract   . Personal history of colonic polyps 07/17/1995    adenomatous polyps  . PONV (postoperative nausea and vomiting)     Past Surgical History  Procedure Date   . Partial hysterectomy   . Thyroidectomy, partial   . Cataract extraction     left    Family History  Problem Relation Age of Onset  . Colon cancer Maternal Grandmother     Social History:  reports that she has never smoked. She has never used smokeless tobacco. She reports that she drinks alcohol. She reports that she does not use illicit drugs.  Allergies: No Known Allergies   Results for orders placed during the hospital encounter of 08/28/12 (from the past 48 hour(s))  BASIC METABOLIC PANEL     Status: Abnormal   Collection Time   08/28/12  4:44 AM      Component Value Range Comment   Sodium 137  135 - 145 mEq/L    Potassium 3.7  3.5 - 5.1 mEq/L    Chloride 102  96 - 112 mEq/L    CO2 23  19 - 32 mEq/L    Glucose, Bld 204 (*) 70 - 99 mg/dL    BUN 25 (*) 6 - 23 mg/dL    Creatinine, Ser 1.61  0.50 - 1.10 mg/dL    Calcium 9.5  8.4 - 09.6 mg/dL    GFR calc non Af Amer 82 (*) >90 mL/min    GFR calc Af Amer >90  >90 mL/min   CBC WITH DIFFERENTIAL     Status: Abnormal   Collection Time   08/28/12  4:44 AM      Component Value Range Comment  WBC 13.7 (*) 4.0 - 10.5 K/uL    RBC 3.91  3.87 - 5.11 MIL/uL    Hemoglobin 12.0  12.0 - 15.0 g/dL    HCT 78.2  95.6 - 21.3 %    MCV 92.6  78.0 - 100.0 fL    MCH 30.7  26.0 - 34.0 pg    MCHC 33.1  30.0 - 36.0 g/dL    RDW 08.6 (*) 57.8 - 15.5 %    Platelets 266  150 - 400 K/uL    Neutrophils Relative 90 (*) 43 - 77 %    Neutro Abs 12.4 (*) 1.7 - 7.7 K/uL    Lymphocytes Relative 4 (*) 12 - 46 %    Lymphs Abs 0.6 (*) 0.7 - 4.0 K/uL    Monocytes Relative 5  3 - 12 %    Monocytes Absolute 0.7  0.1 - 1.0 K/uL    Eosinophils Relative 0  0 - 5 %    Eosinophils Absolute 0.0  0.0 - 0.7 K/uL    Basophils Relative 0  0 - 1 %    Basophils Absolute 0.0  0.0 - 0.1 K/uL   PROTIME-INR     Status: Normal   Collection Time   08/28/12  4:44 AM      Component Value Range Comment   Prothrombin Time 13.1  11.6 - 15.2 seconds    INR 1.00  0.00 - 1.49    TYPE AND SCREEN     Status: Normal   Collection Time   08/28/12  4:44 AM      Component Value Range Comment   ABO/RH(D) O POS      Antibody Screen NEG      Sample Expiration 08/31/2012     URINALYSIS, MICROSCOPIC ONLY     Status: Abnormal   Collection Time   08/28/12  5:43 AM      Component Value Range Comment   Color, Urine YELLOW  YELLOW    APPearance CLEAR  CLEAR    Specific Gravity, Urine 1.021  1.005 - 1.030    pH 5.5  5.0 - 8.0    Glucose, UA NEGATIVE  NEGATIVE mg/dL    Hgb urine dipstick NEGATIVE  NEGATIVE    Bilirubin Urine NEGATIVE  NEGATIVE    Ketones, ur NEGATIVE  NEGATIVE mg/dL    Protein, ur NEGATIVE  NEGATIVE mg/dL    Urobilinogen, UA 0.2  0.0 - 1.0 mg/dL    Nitrite NEGATIVE  NEGATIVE    Leukocytes, UA MODERATE (*) NEGATIVE    WBC, UA 7-10  <3 WBC/hpf     Dg Chest 1 View  08/28/2012  *RADIOLOGY REPORT*  Clinical Data: Preoperative chest radiograph for hip fracture.  CHEST - 1 VIEW  Comparison: Chest radiograph performed 05/06/2012, and CT of the chest performed 05/07/2012  Findings: The lungs are well-aerated.  Minimal left basilar opacity may reflect atelectasis or scarring.  There is no evidence of pleural effusion or pneumothorax.  The cardiomediastinal silhouette is borderline normal in size; calcification is noted within the aortic arch.  No acute osseous abnormalities are seen.  IMPRESSION: Minimal left basilar airspace opacity may reflect atelectasis or scarring; lungs otherwise clear.  No displaced rib fractures seen.   Original Report Authenticated By: Tonia Ghent, M.D.    Dg Hip Complete Right  08/28/2012  *RADIOLOGY REPORT*  Clinical Data: Status post fall; obvious right hip deformity.  RIGHT HIP - COMPLETE 2+ VIEW  Comparison: None.  Findings: There is a mildly displaced comminuted  right femoral intertrochanteric fracture, with a displaced lesser trochanteric fragment.  The right femoral head remains seated at the acetabulum. The left hip joint is grossly  unremarkable in appearance.  Mild degenerative change is noted at the lower lumbar spine.  Mild sclerotic change is seen at the sacroiliac joints.  The visualized bowel gas pattern is grossly unremarkable.  IMPRESSION: Mildly displaced comminuted right femoral intertrochanteric fracture, with a displaced lesser trochanteric fragment.   Original Report Authenticated By: Tonia Ghent, M.D.    Dg Knee Complete 4 Views Right  08/28/2012  *RADIOLOGY REPORT*  Clinical Data: Status post fall; bruising and abrasion about the right patella.  RIGHT KNEE - COMPLETE 4+ VIEW  Comparison: Bilateral knee radiographs performed 01/21/2012  Findings: There is no evidence of fracture or dislocation. Tricompartmental osteoarthritis is noted, with osteophytes arising at all three compartments, and both medial and lateral compartment narrowing.  Mild sclerotic change is noted at the lateral compartment.  A trace knee joint effusion is noted.  Diffuse vascular calcifications are seen.  IMPRESSION:  1.  No evidence of fracture or dislocation. 2.  Tricompartmental osteoarthritis noted. 3.  Trace knee joint effusion seen. 4.  Diffuse vascular calcifications noted.   Original Report Authenticated By: Tonia Ghent, M.D.     Review of Systems  Constitutional: Negative.   HENT: Negative.   Eyes: Negative.   Respiratory: Negative.   Cardiovascular: Negative.   Gastrointestinal: Negative.   Genitourinary: Negative.   Musculoskeletal: Positive for joint pain.  Skin: Negative.   Neurological: Negative.   Endo/Heme/Allergies: Negative.   Psychiatric/Behavioral: Negative.    Blood pressure 120/69, pulse 104, temperature 98.2 F (36.8 C), temperature source Oral, resp. rate 20, height 5\' 6"  (1.676 m), weight 61.689 kg (136 lb), SpO2 97.00%. Physical Exam  Constitutional: She is oriented to person, place, and time. She appears well-developed and well-nourished.  HENT:  Head: Normocephalic and atraumatic.  Eyes: Pupils are  equal, round, and reactive to light.  Neck: Neck supple. No JVD present. No tracheal deviation present. No thyromegaly present.  Cardiovascular: Normal rate and intact distal pulses.   Respiratory: Effort normal and breath sounds normal. No respiratory distress. She has no wheezes.  GI: Soft. There is no tenderness. There is no guarding.  Musculoskeletal:       Right hip: She exhibits decreased range of motion, decreased strength, tenderness, bony tenderness and swelling. She exhibits no deformity and no laceration.  Lymphadenopathy:    She has no cervical adenopathy.  Neurological: She is alert and oriented to person, place, and time.  Skin: Skin is warm and dry.  Psychiatric: She has a normal mood and affect.    Assessment/Plan: Right intertrochanteric fracture  Appreciate medical management from Dr. Rito Ehrlich and team Plan is for surgical repair of the right hip, has been discussed the patient and she understands. Do to timing and surgery schedules the patient understands that surgery might not be until Saturday Surgery will no occur today, so she will be ordered a carb modified diet and given a dose of Lovenox today    Gerrit Halls 08/28/2012, 9:46 AM

## 2012-08-28 NOTE — H&P (Signed)
[name] is an 78 y.o. female.  Patient was seen and examined on August 28, 2012. PCP - Dr. Merri Brunette. Chief Complaint: Fall with right hip pain. HPI: 78 year-old female with history of diabetes mellitus type 2, rheumatoid arthritis, hypothyroidism was brought to the ER after patient had sustained a fall at her house. Patient states she was sitting on a chair and was getting ready to go sleep and she walked and slipped and fell. He is losing consciousness. She called her neighbors and was brought to the ER. X-rays reveal fracture of the right femoral intertrochanteric. At this time patient has been admitted for further management. Patient denies any chest pain shortness of breath or any dizziness or palpitations. Orthopedic surgeon Dr.Olin will be seeing patient in consult.   Past Medical History  Diagnosis Date  . Diverticulosis of colon (without mention of hemorrhage)   . Esophageal reflux   . Hiatal hernia   . Depressive disorder, not elsewhere classified   . Functional diarrhea   . Type II or unspecified type diabetes mellitus with other specified manifestations, not stated as uncontrolled   . Anemia, unspecified   . Acute bronchitis   . Tachycardia, unspecified   . Other specified cardiac dysrhythmias   . Other chest pain   . Unspecified adverse effect of unspecified drug, medicinal and biological substance   . Rheumatoid arthritis   . Unspecified hypothyroidism   . Unspecified essential hypertension   . Other and unspecified hyperlipidemia   . Family history of malignant neoplasm of gastrointestinal tract   . Personal history of colonic polyps 07/17/1995    adenomatous polyps    Past Surgical History  Procedure Date  . Partial hysterectomy   . Thyroidectomy, partial   . Cataract extraction     left    Family History  Problem Relation Age of Onset  . Colon cancer Maternal Grandmother    Social History:  reports that she has never smoked. She has never  used smokeless tobacco. She reports that she drinks alcohol. She reports that she does not use illicit drugs.  Allergies: No Known Allergies   (Not in a hospital admission)  Results for orders placed during the hospital encounter of 08/28/12 (from the past 48 hour(s))  BASIC METABOLIC PANEL     Status: Abnormal   Collection Time   08/28/12  4:44 AM      Component Value Range Comment   Sodium 137  135 - 145 mEq/L    Potassium 3.7  3.5 - 5.1 mEq/L    Chloride 102  96 - 112 mEq/L    CO2 23  19 - 32 mEq/L    Glucose, Bld 204 (*) 70 - 99 mg/dL    BUN 25 (*) 6 - 23 mg/dL    Creatinine, Ser 1.61  0.50 - 1.10 mg/dL    Calcium 9.5  8.4 - 09.6 mg/dL    GFR calc non Af Amer 82 (*) >90 mL/min    GFR calc Af Amer >90  >90 mL/min   CBC WITH DIFFERENTIAL     Status: Abnormal   Collection Time   08/28/12  4:44 AM      Component Value Range Comment   WBC 13.7 (*) 4.0 - 10.5 K/uL    RBC 3.91  3.87 - 5.11 MIL/uL    Hemoglobin 12.0  12.0 - 15.0 g/dL    HCT 04.5  40.9 - 81.1 %    MCV 92.6  78.0 - 100.0 fL  MCH 30.7  26.0 - 34.0 pg    MCHC 33.1  30.0 - 36.0 g/dL    RDW 16.1 (*) 09.6 - 15.5 %    Platelets 266  150 - 400 K/uL    Neutrophils Relative 90 (*) 43 - 77 %    Neutro Abs 12.4 (*) 1.7 - 7.7 K/uL    Lymphocytes Relative 4 (*) 12 - 46 %    Lymphs Abs 0.6 (*) 0.7 - 4.0 K/uL    Monocytes Relative 5  3 - 12 %    Monocytes Absolute 0.7  0.1 - 1.0 K/uL    Eosinophils Relative 0  0 - 5 %    Eosinophils Absolute 0.0  0.0 - 0.7 K/uL    Basophils Relative 0  0 - 1 %    Basophils Absolute 0.0  0.0 - 0.1 K/uL   PROTIME-INR     Status: Normal   Collection Time   08/28/12  4:44 AM      Component Value Range Comment   Prothrombin Time 13.1  11.6 - 15.2 seconds    INR 1.00  0.00 - 1.49   TYPE AND SCREEN     Status: Normal   Collection Time   08/28/12  4:44 AM      Component Value Range Comment   ABO/RH(D) O POS      Antibody Screen NEG      Sample Expiration 08/31/2012     URINALYSIS,  MICROSCOPIC ONLY     Status: Abnormal   Collection Time   08/28/12  5:43 AM      Component Value Range Comment   Color, Urine YELLOW  YELLOW    APPearance CLEAR  CLEAR    Specific Gravity, Urine 1.021  1.005 - 1.030    pH 5.5  5.0 - 8.0    Glucose, UA NEGATIVE  NEGATIVE mg/dL    Hgb urine dipstick NEGATIVE  NEGATIVE    Bilirubin Urine NEGATIVE  NEGATIVE    Ketones, ur NEGATIVE  NEGATIVE mg/dL    Protein, ur NEGATIVE  NEGATIVE mg/dL    Urobilinogen, UA 0.2  0.0 - 1.0 mg/dL    Nitrite NEGATIVE  NEGATIVE    Leukocytes, UA MODERATE (*) NEGATIVE    WBC, UA 7-10  <3 WBC/hpf    Dg Chest 1 View  08/28/2012  *RADIOLOGY REPORT*  Clinical Data: Preoperative chest radiograph for hip fracture.  CHEST - 1 VIEW  Comparison: Chest radiograph performed 05/06/2012, and CT of the chest performed 05/07/2012  Findings: The lungs are well-aerated.  Minimal left basilar opacity may reflect atelectasis or scarring.  There is no evidence of pleural effusion or pneumothorax.  The cardiomediastinal silhouette is borderline normal in size; calcification is noted within the aortic arch.  No acute osseous abnormalities are seen.  IMPRESSION: Minimal left basilar airspace opacity may reflect atelectasis or scarring; lungs otherwise clear.  No displaced rib fractures seen.   Original Report Authenticated By: Tonia Ghent, M.D.    Dg Hip Complete Right  08/28/2012  *RADIOLOGY REPORT*  Clinical Data: Status post fall; obvious right hip deformity.  RIGHT HIP - COMPLETE 2+ VIEW  Comparison: None.  Findings: There is a mildly displaced comminuted right femoral intertrochanteric fracture, with a displaced lesser trochanteric fragment.  The right femoral head remains seated at the acetabulum. The left hip joint is grossly unremarkable in appearance.  Mild degenerative change is noted at the lower lumbar spine.  Mild sclerotic change is seen at the sacroiliac joints.  The visualized bowel gas pattern is grossly unremarkable.   IMPRESSION: Mildly displaced comminuted right femoral intertrochanteric fracture, with a displaced lesser trochanteric fragment.   Original Report Authenticated By: Tonia Ghent, M.D.    Dg Knee Complete 4 Views Right  08/28/2012  *RADIOLOGY REPORT*  Clinical Data: Status post fall; bruising and abrasion about the right patella.  RIGHT KNEE - COMPLETE 4+ VIEW  Comparison: Bilateral knee radiographs performed 01/21/2012  Findings: There is no evidence of fracture or dislocation. Tricompartmental osteoarthritis is noted, with osteophytes arising at all three compartments, and both medial and lateral compartment narrowing.  Mild sclerotic change is noted at the lateral compartment.  A trace knee joint effusion is noted.  Diffuse vascular calcifications are seen.  IMPRESSION:  1.  No evidence of fracture or dislocation. 2.  Tricompartmental osteoarthritis noted. 3.  Trace knee joint effusion seen. 4.  Diffuse vascular calcifications noted.   Original Report Authenticated By: Tonia Ghent, M.D.     Review of Systems  Constitutional: Negative.   HENT: Negative.   Eyes: Negative.   Respiratory: Negative.   Cardiovascular: Negative.   Gastrointestinal: Negative.   Genitourinary: Negative.   Musculoskeletal:       Fall with right hip pain.  Skin: Negative.   Neurological: Negative.   Endo/Heme/Allergies: Negative.   Psychiatric/Behavioral: Negative.     Blood pressure 141/64, pulse 116, temperature 98.2 F (36.8 C), temperature source Oral, resp. rate 19, SpO2 95.00%. Physical Exam  Constitutional: She is oriented to person, place, and time. She appears well-developed and well-nourished. No distress.  HENT:  Head: Normocephalic and atraumatic.  Right Ear: External ear normal.  Left Ear: External ear normal.  Nose: Nose normal.  Mouth/Throat: Oropharynx is clear and moist. No oropharyngeal exudate.  Eyes: Conjunctivae normal are normal. Pupils are equal, round, and reactive to light. Right  eye exhibits no discharge. Left eye exhibits no discharge. No scleral icterus.  Neck: Normal range of motion. Neck supple.  Cardiovascular: Normal rate and regular rhythm.   Respiratory: Effort normal and breath sounds normal. No respiratory distress. She has no wheezes. She has no rales.  GI: Soft. Bowel sounds are normal. She exhibits no distension. There is no tenderness. There is no rebound.  Musculoskeletal:       Pain on moving right hip.  Neurological: She is alert and oriented to person, place, and time.  Skin: Skin is warm and dry. She is not diaphoretic.     Assessment/Plan #1.Right femoral intertrochanteric fracture - patient is usually ambulatory and will benefit from surgery if surgery planned. In anticipation of possible surgery patient will be kept n.p.o. Further recommendations per orthopedic surgeon. #2. Diabetes mellitus type 2 - since patient is n.p.o. check CBG every 4 with sliding-scale coverage. #3. Hypertension - for now patient will be on when necessary IV hydralazine for systolic blood pressure more 160. Continue home medications and patient can take orally. #4. Rheumatoid arthritis - continue home medications when patient can take orally. #5. Hypothyroidism - continue Synthroid.  Patient's chest x-ray and EKG are pending.  CODE STATUS - full code.    Eduard Clos 08/28/2012, 6:49 AM

## 2012-08-28 NOTE — ED Provider Notes (Addendum)
History    CSN: [id number] Arrival date & time 08/28/12  0150 First MD Initiated Contact with Patient 08/28/12 [phone]   Dr Charlann Boxer  Chief Complaint  Patient presents with  . Hip Injury    HPI Pt was walking when she tripped and fell.  She was getting up to go the kitchen using her cane.  She did not get weak.  Pt fell injuring her right hip.  She also has pain her knee.  She has not been able to stand because of the pain.  No CP.  No abd pain.  No LOC.  Past Medical History  Diagnosis Date  . Diverticulosis of colon (without mention of hemorrhage)   . Esophageal reflux   . Hiatal hernia   . Depressive disorder, not elsewhere classified   . Functional diarrhea   . Type II or unspecified type diabetes mellitus with other specified manifestations, not stated as uncontrolled   . Anemia, unspecified   . Acute bronchitis   . Tachycardia, unspecified   . Other specified cardiac dysrhythmias   . Other chest pain   . Unspecified adverse effect of unspecified drug, medicinal and biological substance   . Rheumatoid arthritis   . Unspecified hypothyroidism   . Unspecified essential hypertension   . Other and unspecified hyperlipidemia   . Family history of malignant neoplasm of gastrointestinal tract   . Personal history of colonic polyps 07/17/1995    adenomatous polyps    Past Surgical History  Procedure Date  . Partial hysterectomy   . Thyroidectomy, partial   . Cataract extraction     left    Family History  Problem Relation Age of Onset  . Colon cancer Maternal Grandmother     History  Substance Use Topics  . Smoking status: Never Smoker   . Smokeless tobacco: Never Used  . Alcohol Use: Yes     Comment: one glass of wine per week    OB History    Grav Para Term Preterm Abortions TAB SAB Ect Mult Living                  Review of Systems  All other systems reviewed and are negative.    Allergies  Review of patient's allergies indicates no known  allergies.  Home Medications   Current Outpatient Rx  Name  Route  Sig  Dispense  Refill  . CALCIUM CARBONATE-VITAMIN D 500-200 MG-UNIT PO TABS   Oral   Take 2 tablets by mouth daily.         Marland Kitchen FOLIC ACID 1 MG PO TABS   Oral   Take 1 mg by mouth daily.           Marland Kitchen GLIMEPIRIDE 1 MG PO TABS   Oral   Take 0.5 mg by mouth daily.          Marland Kitchen HYDROXYCHLOROQUINE SULFATE 200 MG PO TABS   Oral   Take 200 mg by mouth 2 (two) times daily.           Marland Kitchen LEVOTHYROXINE SODIUM 137 MCG PO TABS   Oral   Take 137 mcg by mouth daily.           Marland Kitchen METFORMIN HCL 500 MG PO TABS   Oral   Take 500 mg by mouth 2 (two) times daily.         Marland Kitchen METHOTREXATE (ANTI-RHEUMATIC) 2.5 MG PO TABS   Oral   Take 25 mg by mouth once a week. Take  10 pills by mouth every Wednesday         . ADULT MULTIVITAMIN W/MINERALS CH   Oral   Take 1 tablet by mouth daily.         Marland Kitchen PITAVASTATIN CALCIUM 2 MG PO TABS   Oral   Take 2 mg by mouth 3 (three) times a week.         . TELMISARTAN 80 MG PO TABS   Oral   Take 40 mg by mouth daily.            BP 126/78  Pulse 74  Temp 98.2 F (36.8 C) (Oral)  Resp 16  SpO2 99%  Physical Exam  Nursing note and vitals reviewed. Constitutional: No distress.  HENT:  Head: Normocephalic and atraumatic.  Right Ear: External ear normal.  Left Ear: External ear normal.  Eyes: Conjunctivae normal are normal. Right eye exhibits no discharge. Left eye exhibits no discharge. No scleral icterus.  Neck: Neck supple. No tracheal deviation present.  Cardiovascular: Normal rate, regular rhythm and intact distal pulses.   Pulmonary/Chest: Effort normal and breath sounds normal. No stridor. No respiratory distress. She has no wheezes. She has no rales.  Abdominal: Soft. Bowel sounds are normal. She exhibits no distension. There is no tenderness. There is no rebound and no guarding.  Musculoskeletal: She exhibits no edema and no tenderness.       Right shoulder:  Normal.       Left shoulder: Normal.       Right hip: She exhibits decreased range of motion, tenderness and bony tenderness.       Left hip: Normal.       Right knee: She exhibits swelling. She exhibits no deformity. tenderness found.       Cervical back: Normal.  Neurological: She is alert. She has normal strength. No sensory deficit. Cranial nerve deficit:  no gross defecits noted. She exhibits normal muscle tone. She displays no seizure activity. Coordination normal.  Skin: Skin is warm and dry. No rash noted. She is not diaphoretic.  Psychiatric: She has a normal mood and affect.    ED Course  Procedures (including critical care time)  Labs Reviewed  CBC WITH DIFFERENTIAL - Abnormal; Notable for the following:    WBC 13.7 (*)     RDW 15.6 (*)     Neutrophils Relative 90 (*)     Neutro Abs 12.4 (*)     Lymphocytes Relative 4 (*)     Lymphs Abs 0.6 (*)     All other components within normal limits  PROTIME-INR  BASIC METABOLIC PANEL  TYPE AND SCREEN   Dg Chest 1 View  08/28/2012  *RADIOLOGY REPORT*  Clinical Data: Preoperative chest radiograph for hip fracture.  CHEST - 1 VIEW  Comparison: Chest radiograph performed 05/06/2012, and CT of the chest performed 05/07/2012  Findings: The lungs are well-aerated.  Minimal left basilar opacity may reflect atelectasis or scarring.  There is no evidence of pleural effusion or pneumothorax.  The cardiomediastinal silhouette is borderline normal in size; calcification is noted within the aortic arch.  No acute osseous abnormalities are seen.  IMPRESSION: Minimal left basilar airspace opacity may reflect atelectasis or scarring; lungs otherwise clear.  No displaced rib fractures seen.   Original Report Authenticated By: Tonia Ghent, M.D.    Dg Hip Complete Right  08/28/2012  *RADIOLOGY REPORT*  Clinical Data: Status post fall; obvious right hip deformity.  RIGHT HIP - COMPLETE 2+ VIEW  Comparison: None.  Findings: There is a mildly displaced  comminuted right femoral intertrochanteric fracture, with a displaced lesser trochanteric fragment.  The right femoral head remains seated at the acetabulum. The left hip joint is grossly unremarkable in appearance.  Mild degenerative change is noted at the lower lumbar spine.  Mild sclerotic change is seen at the sacroiliac joints.  The visualized bowel gas pattern is grossly unremarkable.  IMPRESSION: Mildly displaced comminuted right femoral intertrochanteric fracture, with a displaced lesser trochanteric fragment.   Original Report Authenticated By: Tonia Ghent, M.D.    Dg Knee Complete 4 Views Right  08/28/2012  *RADIOLOGY REPORT*  Clinical Data: Status post fall; bruising and abrasion about the right patella.  RIGHT KNEE - COMPLETE 4+ VIEW  Comparison: Bilateral knee radiographs performed 01/21/2012  Findings: There is no evidence of fracture or dislocation. Tricompartmental osteoarthritis is noted, with osteophytes arising at all three compartments, and both medial and lateral compartment narrowing.  Mild sclerotic change is noted at the lateral compartment.  A trace knee joint effusion is noted.  Diffuse vascular calcifications are seen.  IMPRESSION:  1.  No evidence of fracture or dislocation. 2.  Tricompartmental osteoarthritis noted. 3.  Trace knee joint effusion seen. 4.  Diffuse vascular calcifications noted.   Original Report Authenticated By: Tonia Ghent, M.D.      1. Intertrochanteric fracture of right femur   2. Anemia       MDM  Pt has intertrochanteric fracture associated with her fall.   Pt sees Dr Charlann Boxer.  Will consult with his group.  Will consult with hospitalist service for admission, hip fracture protocol. Anemia appears stable.     Celene Kras, MD 08/28/12 [phone]

## 2012-08-28 NOTE — ED Notes (Signed)
Pt fell carrying a cane and a cup of milk and did not catch herself . Fell on R hip. No shortening or rotation. States did not hit head. No LOC. Pt lives at home independently. Arthritis hx.

## 2012-08-29 DIAGNOSIS — M069 Rheumatoid arthritis, unspecified: Secondary | ICD-10-CM

## 2012-08-29 DIAGNOSIS — E1169 Type 2 diabetes mellitus with other specified complication: Secondary | ICD-10-CM

## 2012-08-29 DIAGNOSIS — E785 Hyperlipidemia, unspecified: Secondary | ICD-10-CM

## 2012-08-29 LAB — GLUCOSE, CAPILLARY
Glucose-Capillary: 159 mg/dL — ABNORMAL HIGH (ref 70–99)
Glucose-Capillary: 253 mg/dL — ABNORMAL HIGH (ref 70–99)

## 2012-08-29 LAB — URINE CULTURE: Culture: NO GROWTH

## 2012-08-29 LAB — CBC
Hemoglobin: 11.7 g/dL — ABNORMAL LOW (ref 12.0–15.0)
MCHC: 33.4 g/dL (ref 30.0–36.0)
RDW: 15.4 % (ref 11.5–15.5)
WBC: 7.8 10*3/uL (ref 4.0–10.5)

## 2012-08-29 LAB — BASIC METABOLIC PANEL
GFR calc Af Amer: >90 mL/min (ref >90)
GFR calc non Af Amer: 81 mL/min — ABNORMAL LOW (ref >90)
Potassium: 4 mEq/L (ref 3.5–5.1)
Sodium: 134 mEq/L — ABNORMAL LOW (ref 135–145)

## 2012-08-29 MED ORDER — CEFAZOLIN SODIUM-DEXTROSE 2-3 GM-% IV SOLR
2.0000 g | INTRAVENOUS | Status: AC
  Administered 2012-08-30: 2 g via INTRAVENOUS
  Filled 2012-08-29: qty 50

## 2012-08-29 NOTE — Progress Notes (Signed)
CSW assisting with d/c planning. SNF  bed offers have been provided. Top there choices are presently filled but may have openings Mon / Tues. CSW will continue to follow to assistw ith d/c planning.  Cori Razor LCSW [phone]

## 2012-08-29 NOTE — Progress Notes (Signed)
   Subjective: Right intertrochanteric hip fracture   Patient reports pain as moderate, pain is minimal at rest, but any motion it increases significantly .  Objective:   VITALS:   Filed Vitals:   08/29/12 0435  BP: 126/76  Pulse: 90  Temp: 98.7 F (37.1 C)  Resp: 20    Neurovascular intact Dorsiflexion/Plantar flexion intact No cellulitis present Compartment soft  LABS  Basename 08/29/12 0402 08/28/12 0444  HGB 11.7* 12.0  HCT 35.0* 36.2  WBC 7.8 13.7*  PLT 217 266     Basename 08/29/12 0402 08/28/12 0444  NA 134* 137  K 4.0 3.7  BUN 16 25*  CREATININE 0.65 0.63  GLUCOSE 160* 204*     Assessment/Plan: Right intertrochanteric hip fracture  She will have a regular diet today Do to the OR schedule we will not be able to get her to surgery today. Orders are in for her to be NPO after midnight tonight. Plan is for IM nail of the right hip tomorrow.  This has been discussed with the patient, she is not happy with the plan, but still wishing to proceed.     Anastasio Auerbach Reiko Vinje   PAC  08/29/2012, 8:14 AM

## 2012-08-29 NOTE — Anesthesia Preprocedure Evaluation (Addendum)
Anesthesia Evaluation    History of Anesthesia Complications (+) PONV  Airway Mallampati: II TM Distance: >3 FB Neck ROM: Full    Dental  (+) Edentulous Upper and Edentulous Lower   Pulmonary  breath sounds clear to auscultation        Cardiovascular hypertension, Pt. on medications + dysrhythmias Rhythm:Regular Rate:Tachycardia     Neuro/Psych PSYCHIATRIC DISORDERS Depression  Neuromuscular disease    GI/Hepatic hiatal hernia, GERD-  Medicated,  Endo/Other  diabetes, Type 2, Oral Hypoglycemic AgentsHypothyroidism   Renal/GU      Musculoskeletal  (+) Arthritis -,   Abdominal   Peds  Hematology   Anesthesia Other Findings   Reproductive/Obstetrics                          Anesthesia Physical Anesthesia Plan  ASA: III  Anesthesia Plan: General   Post-op Pain Management:    Induction: Intravenous  Airway Management Planned: Oral ETT  Additional Equipment:   Intra-op Plan:   Post-operative Plan: Extubation in OR  Informed Consent: I have reviewed the patients History and Physical, chart, labs and discussed the procedure including the risks, benefits and alternatives for the proposed anesthesia with the patient or authorized representative who has indicated his/her understanding and acceptance.   Dental advisory given  Plan Discussed with: CRNA  Anesthesia Plan Comments:         Anesthesia Quick Evaluation

## 2012-08-29 NOTE — Progress Notes (Signed)
[facility] PROGRESS NOTE  CERRIA RANDHAWA AVW:[id number] DOB: 03/24/1929 DOA: 08/28/2012 PCP: Londell Moh, MD  Assessment/Plan: Principal Problem:  *Closed right hip fracture: For surgery on Sat Active Problems:  HYPOTHYROIDISM: Continue Synthroid  DIABETES MELLITUS, TYPE II: Sliding scale plus home medications  HYPERLIPIDEMIA  HYPERTENSION: Continue Micardis, blood pressures remained stable Rheumatoid arthritis: Continue methotrexate plus Plaquenil  Code Status: Full code  Family Communication: Spoke with pt today.   Disposition Plan: SNF possible Monday   Consultants:  Madelin Rear  Procedures:  None  Antibiotics:  None  HPI/Subjective: 78 year old white female past history diabetes mellitus, rheumatoid arthritis and hypothyroidism who presents after a mechanical fall and found to have right-sided hip fracture. Admitted to the hospitalist service with plans for orthopedic surgery either tomorrow or Saturday.  Patient herself doing okay. Moderate pain but fairly controlled with pain medication.   Objective: Filed Vitals:   08/29/12 0000 08/29/12 0400 08/29/12 0435 08/29/12 1403  BP:   126/76 130/66  Pulse:   90 95  Temp:   98.7 F (37.1 C) 98.3 F (36.8 C)  TempSrc:   Oral   Resp: 17 16 20 22   Height:      Weight:      SpO2: 97% 97% 96% 95%    Intake/Output Summary (Last 24 hours) at 08/29/12 1443 Last data filed at 08/29/12 1404  Gross per 24 hour  Intake    500 ml  Output   1310 ml  Net   -810 ml   Filed Weights   08/28/12 0700  Weight: 61.689 kg (136 lb)    Exam:   General:  Alert and oriented x3, no acute distress  Cardiovascular: Regular rate and rhythm, S1-S2  Respiratory: Clear to auscultation bilaterally  Abdomen: Soft, nontender, nondistended, positive bowel sounds  Extremities: No clubbing or cyanosis or edema  Data Reviewed: Basic Metabolic Panel:  Lab 08/29/12 8295 08/28/12 0444  NA 134* 137  K 4.0 3.7   CL 101 102  CO2 26 23  GLUCOSE 160* 204*  BUN 16 25*  CREATININE 0.65 0.63  CALCIUM 8.7 9.5  MG -- --  PHOS -- --   CBC:  Lab 08/29/12 0402 08/28/12 0444  WBC 7.8 13.7*  NEUTROABS -- 12.4*  HGB 11.7* 12.0  HCT 35.0* 36.2  MCV 92.8 92.6  PLT 217 266   CBG:  Lab 08/29/12 0749 08/29/12 0448 08/29/12 0001 08/28/12 1954 08/28/12 1619  GLUCAP 154* 159* 159* 141* 169*      Studies: Dg Chest 1 View  08/28/2012 IMPRESSION: Minimal left basilar airspace opacity may reflect atelectasis or scarring; lungs otherwise clear.  No displaced rib fractures seen.   Original Report Authenticated By: Tonia Ghent, M.D.    Dg Hip Complete Right  08/28/2012   IMPRESSION: Mildly displaced comminuted right femoral intertrochanteric fracture, with a displaced lesser trochanteric fragment.   Original Report Authenticated By: Tonia Ghent, M.D.    Dg Knee Complete 4 Views Right  08/28/2012   IMPRESSION:  1.  No evidence of fracture or dislocation. 2.  Tricompartmental osteoarthritis noted. 3.  Trace knee joint effusion seen. 4.  Diffuse vascular calcifications noted.   Original Report Authenticated By: Tonia Ghent, M.D.     Scheduled Meds:    .  ceFAZolin (ANCEF) IV  2 g Intravenous 60 min Pre-Op  . enoxaparin (LOVENOX) injection  40 mg Subcutaneous Q24H  . feeding supplement  237 mL Oral BID BM  . glimepiride  0.5 mg Oral Daily  .  hydroxychloroquine  200 mg Oral BID  . insulin aspart  0-9 Units Subcutaneous TID WC  . irbesartan  150 mg Oral Daily  . levothyroxine  137 mcg Oral QAC breakfast  . metFORMIN  500 mg Oral BID  . methotrexate  25 mg Oral Weekly   Continuous Infusions:    . sodium chloride         Time spent: 15 minutes    Hollice Espy  [facility] Pager [phone]. If 8PM-8AM, please contact night-coverage at [email or web address], password [facility] 08/29/2012, 2:43 PM  LOS: 1 day

## 2012-08-30 ENCOUNTER — Inpatient Hospital Stay (HOSPITAL_COMMUNITY): Payer: Medicare Other

## 2012-08-30 ENCOUNTER — Encounter (HOSPITAL_COMMUNITY): Payer: Self-pay | Admitting: Anesthesiology

## 2012-08-30 ENCOUNTER — Encounter (HOSPITAL_COMMUNITY)
Admission: EM | Disposition: A | Discharge status: Skilled Nursing Facility | Payer: Self-pay | Source: Home / Self Care | Attending: Internal Medicine

## 2012-08-30 ENCOUNTER — Inpatient Hospital Stay (HOSPITAL_COMMUNITY): Payer: Medicare Other | Admitting: Anesthesiology

## 2012-08-30 DIAGNOSIS — D649 Anemia, unspecified: Secondary | ICD-10-CM

## 2012-08-30 DIAGNOSIS — S72143A Displaced intertrochanteric fracture of unspecified femur, initial encounter for closed fracture: Principal | ICD-10-CM

## 2012-08-30 HISTORY — PX: ORIF FEMUR FRACTURE: SHX2119

## 2012-08-30 LAB — GLUCOSE, CAPILLARY
Glucose-Capillary: 164 mg/dL — ABNORMAL HIGH (ref 70–99)
Glucose-Capillary: 147 mg/dL — ABNORMAL HIGH (ref 70–99)

## 2012-08-30 LAB — VITAMIN D 1,25 DIHYDROXY: Vitamin D2 1, 25 (OH)2: <8 pg/mL

## 2012-08-30 SURGERY — OPEN REDUCTION INTERNAL FIXATION (ORIF) DISTAL FEMUR FRACTURE
Anesthesia: General | Site: Hip | Laterality: Right | Wound class: Clean

## 2012-08-30 MED ORDER — METOCLOPRAMIDE HCL 5 MG/ML IJ SOLN: 5.0000 mg | Freq: Three times a day (TID) | INTRAMUSCULAR | Status: DC | PRN

## 2012-08-30 MED ORDER — SODIUM CHLORIDE 0.9 % IV SOLN
INTRAVENOUS | Status: DC
  Administered 2012-08-30: 14:00:00 via INTRAVENOUS
  Filled 2012-08-30 (×4): qty 1000

## 2012-08-30 MED ORDER — LACTATED RINGERS IV SOLN
INTRAVENOUS | Status: DC | PRN
  Administered 2012-08-30: 07:00:00 via INTRAVENOUS

## 2012-08-30 MED ORDER — FERROUS SULFATE 325 (65 FE) MG PO TABS
325.0000 mg | ORAL_TABLET | Freq: Three times a day (TID) | ORAL | Status: DC
  Administered 2012-08-30 – 2012-09-01 (×5): 325 mg via ORAL
  Filled 2012-08-30 (×9): qty 1

## 2012-08-30 MED ORDER — CEFAZOLIN SODIUM-DEXTROSE 2-3 GM-% IV SOLR
2.0000 g | Freq: Four times a day (QID) | INTRAVENOUS | Status: AC
  Administered 2012-08-30 (×2): 2 g via INTRAVENOUS
  Filled 2012-08-30 (×2): qty 50

## 2012-08-30 MED ORDER — ACETAMINOPHEN 650 MG RE SUPP: 650.0000 mg | Freq: Four times a day (QID) | RECTAL | Status: DC | PRN

## 2012-08-30 MED ORDER — EPHEDRINE SULFATE 50 MG/ML IJ SOLN
INTRAMUSCULAR | Status: DC | PRN
  Administered 2012-08-30 (×2): 5 mg via INTRAVENOUS

## 2012-08-30 MED ORDER — PROMETHAZINE HCL 25 MG/ML IJ SOLN
6.2500 mg | INTRAMUSCULAR | Status: DC | PRN
  Administered 2012-08-30: 6.25 mg via INTRAVENOUS

## 2012-08-30 MED ORDER — FENTANYL CITRATE 0.05 MG/ML IJ SOLN
INTRAMUSCULAR | Status: DC | PRN
  Administered 2012-08-30 (×2): 50 ug via INTRAVENOUS

## 2012-08-30 MED ORDER — ACETAMINOPHEN 325 MG PO TABS: 650.0000 mg | ORAL_TABLET | Freq: Four times a day (QID) | ORAL | Status: DC | PRN

## 2012-08-30 MED ORDER — PROPOFOL 10 MG/ML IV BOLUS
INTRAVENOUS | Status: DC | PRN
  Administered 2012-08-30: 140 mg via INTRAVENOUS

## 2012-08-30 MED ORDER — HYDROMORPHONE HCL PF 1 MG/ML IJ SOLN
0.2500 mg | INTRAMUSCULAR | Status: DC | PRN
  Administered 2012-08-30 (×4): 0.5 mg via INTRAVENOUS

## 2012-08-30 MED ORDER — PHENOL 1.4 % MT LIQD
1.0000 | OROMUCOSAL | Status: DC | PRN
  Filled 2012-08-30: qty 177

## 2012-08-30 MED ORDER — ONDANSETRON HCL 4 MG/2ML IJ SOLN: 4.0000 mg | Freq: Four times a day (QID) | INTRAMUSCULAR | Status: DC | PRN

## 2012-08-30 MED ORDER — ONDANSETRON HCL 4 MG PO TABS: 4.0000 mg | ORAL_TABLET | Freq: Four times a day (QID) | ORAL | Status: DC | PRN

## 2012-08-30 MED ORDER — DOCUSATE SODIUM 100 MG PO CAPS
100.0000 mg | ORAL_CAPSULE | Freq: Two times a day (BID) | ORAL | Status: DC
  Administered 2012-08-30 – 2012-09-01 (×4): 100 mg via ORAL

## 2012-08-30 MED ORDER — METOCLOPRAMIDE HCL 10 MG PO TABS: 5.0000 mg | ORAL_TABLET | Freq: Three times a day (TID) | ORAL | Status: DC | PRN

## 2012-08-30 MED ORDER — MORPHINE SULFATE 2 MG/ML IJ SOLN: 0.5000 mg | INTRAMUSCULAR | Status: DC | PRN

## 2012-08-30 MED ORDER — POLYETHYLENE GLYCOL 3350 17 G PO PACK
17.0000 g | PACK | Freq: Every day | ORAL | Status: DC | PRN
  Administered 2012-08-31: 17 g via ORAL

## 2012-08-30 MED ORDER — LIDOCAINE HCL (CARDIAC) 20 MG/ML IV SOLN
INTRAVENOUS | Status: DC | PRN
  Administered 2012-08-30: 50 mg via INTRAVENOUS

## 2012-08-30 MED ORDER — ENOXAPARIN SODIUM 40 MG/0.4ML ~~LOC~~ SOLN
40.0000 mg | SUBCUTANEOUS | Status: DC
  Administered 2012-08-31 – 2012-09-01 (×2): 40 mg via SUBCUTANEOUS
  Filled 2012-08-30 (×3): qty 0.4

## 2012-08-30 MED ORDER — MENTHOL 3 MG MT LOZG
1.0000 | LOZENGE | OROMUCOSAL | Status: DC | PRN
  Filled 2012-08-30: qty 9

## 2012-08-30 MED ORDER — ONDANSETRON HCL 4 MG/2ML IJ SOLN
INTRAMUSCULAR | Status: DC | PRN
  Administered 2012-08-30: 4 mg via INTRAVENOUS

## 2012-08-30 MED ORDER — SUCCINYLCHOLINE CHLORIDE 20 MG/ML IJ SOLN
INTRAMUSCULAR | Status: DC | PRN
  Administered 2012-08-30: 100 mg via INTRAVENOUS

## 2012-08-30 MED ORDER — LACTATED RINGERS IV SOLN: INTRAVENOUS | Status: DC

## 2012-08-30 MED ORDER — HYDROCODONE-ACETAMINOPHEN 5-325 MG PO TABS
1.0000 | ORAL_TABLET | Freq: Four times a day (QID) | ORAL | Status: DC | PRN
  Administered 2012-08-30: 1 via ORAL

## 2012-08-30 MED ORDER — LACTATED RINGERS IV SOLN
INTRAVENOUS | Status: DC
  Administered 2012-08-30: 09:00:00 via INTRAVENOUS

## 2012-08-30 MED ORDER — 0.9 % SODIUM CHLORIDE (POUR BTL) OPTIME
TOPICAL | Status: DC | PRN
  Administered 2012-08-30: 1000 mL

## 2012-08-30 SURGICAL SUPPLY — 40 items
BAG SPEC THK2 15X12 ZIP CLS (MISCELLANEOUS) ×1
BAG ZIPLOCK 12X15 (MISCELLANEOUS) ×2 IMPLANT
BANDAGE GAUZE ELAST BULKY 4 IN (GAUZE/BANDAGES/DRESSINGS) ×2 IMPLANT
BIT DRILL CANN LG 4.3MM (BIT) IMPLANT
BNDG COHESIVE 4X5 TAN STRL (GAUZE/BANDAGES/DRESSINGS) ×2 IMPLANT
CLOTH BEACON ORANGE TIMEOUT ST (SAFETY) ×2 IMPLANT
DRILL BIT CANN LG 4.3MM (BIT) ×2
DRSG EMULSION OIL 3X16 NADH (GAUZE/BANDAGES/DRESSINGS) ×2 IMPLANT
DRSG MEPILEX BORDER 4X12 (GAUZE/BANDAGES/DRESSINGS) ×1 IMPLANT
DURAPREP 26ML APPLICATOR (WOUND CARE) ×2 IMPLANT
ELECT REM PT RETURN 9FT ADLT (ELECTROSURGICAL) ×2
ELECTRODE REM PT RTRN 9FT ADLT (ELECTROSURGICAL) ×1 IMPLANT
GLOVE BIOGEL PI IND STRL 7.5 (GLOVE) ×1 IMPLANT
GLOVE BIOGEL PI IND STRL 8 (GLOVE) ×1 IMPLANT
GLOVE BIOGEL PI INDICATOR 7.5 (GLOVE) ×1
GLOVE BIOGEL PI INDICATOR 8 (GLOVE) ×1
GLOVE ECLIPSE 8.0 STRL XLNG CF (GLOVE) IMPLANT
GLOVE ORTHO TXT STRL SZ7.5 (GLOVE) ×4 IMPLANT
GLOVE SURG ORTHO 8.0 STRL STRW (GLOVE) ×2 IMPLANT
GOWN STRL NON-REIN LRG LVL3 (GOWN DISPOSABLE) ×2 IMPLANT
GUIDEPIN 3.2X17.5 THRD DISP (PIN) ×1 IMPLANT
GUIDEWIRE BALL NOSE 80CM (WIRE) ×1 IMPLANT
HIP FRAC NAIL LAG SCR 10.5X100 (Orthopedic Implant) ×1 IMPLANT
KIT BASIN OR (CUSTOM PROCEDURE TRAY) ×2 IMPLANT
MANIFOLD NEPTUNE II (INSTRUMENTS) ×2 IMPLANT
NAIL HIP FRACT 130D 9X180 (Orthopedic Implant) ×1 IMPLANT
NS IRRIG 1000ML POUR BTL (IV SOLUTION) ×2 IMPLANT
PACK TOTAL JOINT (CUSTOM PROCEDURE TRAY) ×2 IMPLANT
POSITIONER SURGICAL ARM (MISCELLANEOUS) ×4 IMPLANT
SCREW BONE CORTICAL 5.0X36 (Screw) ×1 IMPLANT
SCREW CANN THRD AFF 10.5X100 (Orthopedic Implant) IMPLANT
SPONGE GAUZE 4X4 12PLY (GAUZE/BANDAGES/DRESSINGS) ×2 IMPLANT
STAPLER VISISTAT 35W (STAPLE) ×2 IMPLANT
STRIP CLOSURE SKIN 1/2X4 (GAUZE/BANDAGES/DRESSINGS) ×2 IMPLANT
SUT MNCRL AB 4-0 PS2 18 (SUTURE) ×2 IMPLANT
SUT VIC AB 1 CT1 36 (SUTURE) ×4 IMPLANT
SUT VIC AB 2-0 CT1 27 (SUTURE) ×4
SUT VIC AB 2-0 CT1 TAPERPNT 27 (SUTURE) ×2 IMPLANT
TOWEL OR 17X26 10 PK STRL BLUE (TOWEL DISPOSABLE) ×4 IMPLANT
WATER STERILE IRR 1500ML POUR (IV SOLUTION) ×2 IMPLANT

## 2012-08-30 NOTE — Brief Op Note (Signed)
08/28/2012 - 08/30/2012  8:51 AM  PATIENT:  [name]  78 y.o. female  PRE-OPERATIVE DIAGNOSIS:  RIGHT HIP intertrochanteric FRACTURE  POST-OPERATIVE DIAGNOSIS:   RIGHT HIP intertrochanteric FRACTURE  PROCEDURE:  Procedure(s) (LRB) with comments: OPEN REDUCTION INTERNAL FIXATION (ORIF) DISTAL FEMUR FRACTURE (Right) - proximal femur  SURGEON:  Surgeon(s) and Role:    * Shelda Pal, MD - Primary  PHYSICIAN ASSISTANT: None  ANESTHESIA:   general  EBL:  Total I/O In: -  Out: 250 [Urine:100; Blood:150]  BLOOD ADMINISTERED:none  DRAINS: none   LOCAL MEDICATIONS USED:  NONE  SPECIMEN:  No Specimen  DISPOSITION OF SPECIMEN:  N/A  COUNTS:  YES  TOURNIQUET:  * No tourniquets in log *  DICTATION: .Other Dictation: Dictation Number [id number]  PLAN OF CARE: Admit to inpatient   PATIENT DISPOSITION:  PACU - hemodynamically stable.   Delay start of Pharmacological VTE agent (>24hrs) due to surgical blood loss or risk of bleeding: no

## 2012-08-30 NOTE — Anesthesia Postprocedure Evaluation (Signed)
Anesthesia Post Note  Patient: [name]  Procedure(s) Performed: Procedure(s) (LRB): OPEN REDUCTION INTERNAL FIXATION (ORIF) DISTAL FEMUR FRACTURE (Right)  Anesthesia type: General  Patient location: PACU  Post pain: Pain level controlled  Post assessment: Post-op Vital signs reviewed  Last Vitals:  Filed Vitals:   08/30/12 0945  BP:   Pulse: 94  Temp:   Resp: 19    Post vital signs: Reviewed  Level of consciousness: sedated  Complications: No apparent anesthesia complications

## 2012-08-30 NOTE — Progress Notes (Signed)
Patient ID: [name], female   DOB: 08/14/1929, 78 y.o.   MRN: [id number]  Right intertochanteric femur fracture  Doing ok, but ready to proceed  NPO Vitals and labs stable  To OR today for ORIF of the right hip

## 2012-08-30 NOTE — Transfer of Care (Signed)
Immediate Anesthesia Transfer of Care Note  Patient: [name]  Procedure(s) Performed: Procedure(s) (LRB) with comments: OPEN REDUCTION INTERNAL FIXATION (ORIF) DISTAL FEMUR FRACTURE (Right) - proximal femur  Patient Location: PACU  Anesthesia Type:General  Level of Consciousness: awake, alert  and oriented  Airway & Oxygen Therapy: Patient Spontanous Breathing and Patient connected to face mask oxygen  Post-op Assessment: Report given to PACU RN and Post -op Vital signs reviewed and stable  Post vital signs: Reviewed and stable  Complications: No apparent anesthesia complications

## 2012-08-30 NOTE — Progress Notes (Signed)
[facility] PROGRESS NOTE  [name] GNF:[id number] DOB: 11-02-1928 DOA: 08/28/2012 PCP: Londell Moh, MD  Assessment/Plan: Principal Problem:  *Closed right hip fracture: For surgery on Sat Active Problems:  HYPOTHYROIDISM: Continue Synthroid  DIABETES MELLITUS, TYPE II: Sliding scale plus home medications. Patient initially n.p.o. so we'll watch as her diet was advanced.  HYPERLIPIDEMIA  HYPERTENSION: Continue Micardis, blood pressures starting to rise. Some part of this is from pain as anesthesia wears off. We'll watch closely. Rheumatoid arthritis: Continue methotrexate plus Plaquenil  Code Status: Full code  Family Communication: Spoke with pt today.   Disposition Plan: SNF possible Monday   Consultants:  Madelin Rear  Procedures:  None  Antibiotics:  None  HPI/Subjective: 78 year old white female past history diabetes mellitus, rheumatoid arthritis and hypothyroidism who presents after a mechanical fall and found to have right-sided hip fracture. Admitted to the hospitalist service with plans for orthopedic surgery either tomorrow or Saturday.  Patient seen after surgery today. Some throat irritation status post endotracheal tube. He mild nausea she is having mild nausea as well as some soreness at surgery site. Tolerable.  Objective: Filed Vitals:   08/30/12 0945 08/30/12 0947 08/30/12 1016 08/30/12 1114  BP: 127/52  162/76 125/70  Pulse: 94  102 92  Temp:  98.3 F (36.8 C) 97.4 F (36.3 C) 97.5 F (36.4 C)  TempSrc:    Oral  Resp: 19  16 16   Height:      Weight:      SpO2: 100%  99% 99%    Intake/Output Summary (Last 24 hours) at 08/30/12 1212 Last data filed at 08/30/12 0944  Gross per 24 hour  Intake   1340 ml  Output   1760 ml  Net   -420 ml   Filed Weights   08/28/12 0700  Weight: 61.689 kg (136 lb)    Exam:   General:  Alert and oriented x3, no acute distress  Cardiovascular: Regular rate and rhythm,  S1-S2  Respiratory: Clear to auscultation bilaterally  Abdomen: Soft, nontender, nondistended, positive bowel sounds  Extremities: No clubbing or cyanosis or edema  Data Reviewed: Basic Metabolic Panel:  Lab 08/29/12 8469 08/28/12 0444  NA 134* 137  K 4.0 3.7  CL 101 102  CO2 26 23  GLUCOSE 160* 204*  BUN 16 25*  CREATININE 0.65 0.63  CALCIUM 8.7 9.5  MG -- --  PHOS -- --   CBC:  Lab 08/29/12 0402 08/28/12 0444  WBC 7.8 13.7*  NEUTROABS -- 12.4*  HGB 11.7* 12.0  HCT 35.0* 36.2  MCV 92.8 92.6  PLT 217 266   CBG:  Lab 08/30/12 1110 08/30/12 0930 08/29/12 2128 08/29/12 1708 08/29/12 0749  GLUCAP 188* 164* 154* 253* 154*      Studies: Dg Chest 1 View  08/28/2012 IMPRESSION: Minimal left basilar airspace opacity may reflect atelectasis or scarring; lungs otherwise clear.  No displaced rib fractures seen.   Original Report Authenticated By: Tonia Ghent, M.D.    Dg Hip Complete Right  08/28/2012   IMPRESSION: Mildly displaced comminuted right femoral intertrochanteric fracture, with a displaced lesser trochanteric fragment.   Original Report Authenticated By: Tonia Ghent, M.D.    Dg Knee Complete 4 Views Right  08/28/2012   IMPRESSION:  1.  No evidence of fracture or dislocation. 2.  Tricompartmental osteoarthritis noted. 3.  Trace knee joint effusion seen. 4.  Diffuse vascular calcifications noted.   Original Report Authenticated By: Tonia Ghent, M.D.     Scheduled Meds:    . [  COMPLETED]  ceFAZolin (ANCEF) IV  2 g Intravenous 60 min Pre-Op  .  ceFAZolin (ANCEF) IV  2 g Intravenous Q6H  . docusate sodium  100 mg Oral BID  . enoxaparin (LOVENOX) injection  40 mg Subcutaneous Q24H  . feeding supplement  237 mL Oral BID BM  . ferrous sulfate  325 mg Oral TID PC  . glimepiride  0.5 mg Oral Daily  . hydroxychloroquine  200 mg Oral BID  . insulin aspart  0-9 Units Subcutaneous TID WC  . irbesartan  150 mg Oral Daily  . levothyroxine  137 mcg Oral QAC  breakfast  . metFORMIN  500 mg Oral BID  . [DISCONTINUED] enoxaparin (LOVENOX) injection  40 mg Subcutaneous Q24H  . [DISCONTINUED] methotrexate  25 mg Oral Weekly   Continuous Infusions:    . sodium chloride 20 mL/hr (08/29/12 1836)  . sodium chloride 0.9 % 1,000 mL with potassium chloride 10 mEq infusion    . [DISCONTINUED] lactated ringers 125 mL/hr at 08/30/12 0920  . [DISCONTINUED] lactated ringers         Time spent: 25 minutes    Hollice Espy  [facility] Pager [phone]. If 8PM-8AM, please contact night-coverage at [email or web address], password [facility] 08/30/2012, 12:12 PM  LOS: 2 days

## 2012-08-30 NOTE — Op Note (Signed)
[name], [name]           ACCOUNT NO.:  [id number]  MEDICAL RECORD NO.:  [id number]  LOCATION:  1601                         FACILITY:  [facility]  PHYSICIAN:  Madlyn Frankel. Charlann Boxer, M.D.  DATE OF BIRTH:  1928-10-30  DATE OF PROCEDURE:  08/30/2012 DATE OF DISCHARGE:                              OPERATIVE REPORT   PREOPERATIVE DIAGNOSIS:  Right intertrochanteric femur fracture.  POSTOPERATIVE DIAGNOSIS:  Right intertrochanteric femur fracture.  PROCEDURE:  Open reduction and internal fixation of right intertrochanteric femur fracture using the Biomet Affixus System with a size 9 x 180 mm with a 100-mm lag screw at 130 degrees, and a distal interlock.  SURGEON:  Madlyn Frankel. Charlann Boxer, MD  ASSISTANT:  Surgical Team.  ANESTHESIA:  General.  SPECIMENS:  None.  COMPLICATIONS:  None.  DRAINS:  None.  BLOOD LOSS:  About 100 mL.  INDICATION FOR PROCEDURE:  Ms. Hack is an 78 year old female with a ground level fall.  She presented to emergency room where radiographs revealed an intertrochanteric femur fracture and avulsion of the lesser trochanter.  Risks, benefits, and necessity of the procedure at hand were reviewed and discussed.  Consent was obtained for benefit of fracture management.  PROCEDURE IN DETAIL:  The patient was brought to the operative theater. Once adequate anesthesia, preoperative antibiotics, Ancef administered she was positioned supine on the fracture table.  Her left unaffected extremity was flexed and abducted out of the way with bony prominences padded.  The right foot was placed in the traction boot.  Once bony prominences were padded included a padded perineal post on the left leg, a fluoroscopy was brought to the field.  I spent time at this point identifying and evaluating the fracture pattern for means to establish an anatomic reduction.  This included decision-making that I would need to have some maintenance of reduction on the inferior portion  of the neck/intertrochanteric segment in order to maintain reduction, and that some sort of compression will be applied to medialize the shaft to the neck segment.  The right hip area was then prepped and draped in sterile fashion using shower curtain technique, fluoroscopy back to the field identifying the landmarks.  After performing a time-out identifying the patient, planned procedure, and extremity, a lateral incision was made to the proximal trochanter.  Sharp dissection was carried through the gluteal fascia. Guidewire was then inserted at the tip of the trochanter under fluoroscopic confirmation.  The proximal femur was then opened with a drill and I had selected a 9 mm nail based on the appearance of the canal, however, when I had the intramedullary nail in we still needed about a centimeter penetration and was worried about the diameter of the canal distally.  I chose to remove the nail at this point, and then placed a long ball-tip guidewire.  At the same time under fluoroscopic imaging I identified the location of possible placement of the lag screw in addition to the area at the inferior neck lesser trochanter area.  I then made an incision in this area.  I incised through the iliotibial band, and then used the Biomet hook to come over the top of the lesser trochanter to maintain reduction.  This was  confirmed radiographically.  With the hook in place, I then reamed the canal up to a 10.5 mm and then removed the guidewire and passed the nail at this point by hand easily to its appropriate depth.  The guidewire was then inserted with the maintenance of reduction using this hook into the center of the head in AP and lateral planes.  I measured the depth, selected a 100-mm lag screw, drilled it to the subchondral bone and femoral head.  The lag screw was then passed with good bone purchase proximally in the head.  I then applied the compression wheel and compressed medial half  of the shaft of the neck.  At this point, I tightened the proximal bolt within the nail tightly and then backed off a quarter turn to allow for some compression.  I then placed a distal interlock with the jig in place  measuring 36 mm.  At this point, the jig was removed off the proximal end of the nail. The final radiographs were obtained in AP and lateral planes.  I then irrigated all wounds, reapproximating the most distal stab incision with 2-0 Vicryl and staples.  The remaining 2 wounds were closed in layers with #1 Vicryl on the iliotibial band and gluteal fascia, then 2-0 Vicryl and staples on the skin.  The skin was cleaned, dried, and dressed sterilely using Mepilex dressing.  She was then brought to the recovery room in stable condition tolerating the procedure well.     Madlyn Frankel Charlann Boxer, M.D.     MDO/MEDQ  D:  08/30/2012  T:  08/30/2012  Job:  [id number]

## 2012-08-31 LAB — CBC
MCH: 30.8 pg (ref 26.0–34.0)
Platelets: 223 10*3/uL (ref 150–400)
RBC: 3.31 MIL/uL — ABNORMAL LOW (ref 3.87–5.11)
WBC: 6.7 10*3/uL (ref 4.0–10.5)

## 2012-08-31 LAB — GLUCOSE, CAPILLARY
Glucose-Capillary: 166 mg/dL — ABNORMAL HIGH (ref 70–99)
Glucose-Capillary: 156 mg/dL — ABNORMAL HIGH (ref 70–99)
Glucose-Capillary: 167 mg/dL — ABNORMAL HIGH (ref 70–99)
Glucose-Capillary: 174 mg/dL — ABNORMAL HIGH (ref 70–99)

## 2012-08-31 LAB — BASIC METABOLIC PANEL
CO2: 27 mEq/L (ref 19–32)
Calcium: 8.3 mg/dL — ABNORMAL LOW (ref 8.4–10.5)
Chloride: 101 mEq/L (ref 96–112)
Sodium: 135 mEq/L (ref 135–145)

## 2012-08-31 MED ORDER — DSS 100 MG PO CAPS: 100.0000 mg | ORAL_CAPSULE | Freq: Two times a day (BID) | ORAL | Status: DC

## 2012-08-31 MED ORDER — HYDROCODONE-ACETAMINOPHEN 5-325 MG PO TABS: 1.0000 | ORAL_TABLET | Freq: Four times a day (QID) | ORAL | Status: DC | PRN

## 2012-08-31 MED ORDER — POLYETHYLENE GLYCOL 3350 17 G PO PACK: 17.0000 g | PACK | Freq: Every day | ORAL | Status: DC | PRN

## 2012-08-31 MED ORDER — FERROUS SULFATE 325 (65 FE) MG PO TABS: 325.0000 mg | ORAL_TABLET | Freq: Three times a day (TID) | ORAL | Status: DC

## 2012-08-31 NOTE — Evaluation (Signed)
Physical Therapy Evaluation Patient Details Name: [name] MRN: [id number] DOB: 12-06-1928 Today's Date: 08/31/2012 Time: 4098-1191 PT Time Calculation (min): 50 min  PT Assessment / Plan / Recommendation Clinical Impression  Pt presents with R femur fracture s/p ORIF POD 1 with decreased strength, ROM and mobility.  Tolerated OOB to 3in1 and took several steps to recliner with +2 assist for safety.  Pt will benefit from skilled PT in acute venue to address defictis.  PT recommends SNF for follow up at D/C to maximize pts safety and independence.     PT Assessment  Patient needs continued PT services    Follow Up Recommendations  SNF;Supervision/Assistance - 24 hour    Does the patient have the potential to tolerate intense rehabilitation      Barriers to Discharge Decreased caregiver support      Equipment Recommendations  Rolling walker with 5" wheels    Recommendations for Other Services OT consult   Frequency Min 3X/week    Precautions / Restrictions Precautions Precautions: Fall Restrictions Weight Bearing Restrictions: Yes RLE Weight Bearing: Partial weight bearing RLE Partial Weight Bearing Percentage or Pounds: 50%   Pertinent Vitals/Pain 6/10      Mobility  Bed Mobility Bed Mobility: Supine to Sit;Sitting - Scoot to Edge of Bed Supine to Sit: 1: +2 Total assist Supine to Sit: Patient Percentage: 40% Sitting - Scoot to Edge of Bed: 1: +2 Total assist Sitting - Scoot to Edge of Bed: Patient Percentage: 40% Details for Bed Mobility Assistance: Assist for RLE out of bed and some assist for trunk to attain sitting. Max cues for hand placement to self assist and after several attempts, pt finally able to assist somewhat with UEs.  Transfers Transfers: Sit to Stand;Stand to Sit Sit to Stand: 1: +2 Total assist;From elevated surface;With upper extremity assist;From bed Sit to Stand: Patient Percentage: 40% Stand to Sit: 1: +2 Total assist;With upper  extremity assist;With armrests;To chair/3-in-1 Stand to Sit: Patient Percentage: 40% Details for Transfer Assistance: Assist to rise, steady and ensure controlled descent with max cues for hand placement and LE management when sitting/standing. Performed x 2 in order to use 3in1 at bedside.  Ambulation/Gait Ambulation/Gait Assistance: 1: +2 Total assist Ambulation/Gait: Patient Percentage: 40% Ambulation Distance (Feet): 3 Feet (then 6') Assistive device: Rolling walker Ambulation/Gait Assistance Details: Cues for sequencing/technique with RW, maintaining PWB and upright posture with assist required for upright stance and steadying.  Gait Pattern: Step-to pattern;Decreased stride length;Trunk flexed Gait velocity: decreased Stairs: No Wheelchair Mobility Wheelchair Mobility: No    Shoulder Instructions     Exercises     PT Diagnosis: Difficulty walking;Generalized weakness;Acute pain  PT Problem List: Decreased strength;Decreased range of motion;Decreased activity tolerance;Decreased balance;Decreased mobility;Decreased knowledge of use of DME;Decreased safety awareness;Decreased knowledge of precautions;Pain PT Treatment Interventions: DME instruction;Gait training;Functional mobility training;Therapeutic activities;Therapeutic exercise;Balance training;Patient/family education   PT Goals Acute Rehab PT Goals PT Goal Formulation: With patient Time For Goal Achievement: 09/14/12 Potential to Achieve Goals: Good Pt will go Supine/Side to Sit: with min assist PT Goal: Supine/Side to Sit - Progress: Goal set today Pt will go Sit to Supine/Side: with min assist PT Goal: Sit to Supine/Side - Progress: Goal set today Pt will go Sit to Stand: with min assist PT Goal: Sit to Stand - Progress: Goal set today Pt will go Stand to Sit: with min assist PT Goal: Stand to Sit - Progress: Goal set today Pt will Ambulate: 16 - 50 feet;with min assist;with least restrictive  assistive device PT  Goal: Ambulate - Progress: Goal set today  Visit Information  Last PT Received On: 08/31/12 Assistance Needed: +2    Subjective Data  Subjective: I've never done this so I don't know what I'm doing.  Patient Stated Goal: to walk again   Prior Functioning  Home Living Lives With: Alone Available Help at Discharge: Skilled Nursing Facility Type of Home: House Home Layout: One level Home Adaptive Equipment: Straight cane Prior Function Vocation: Retired Musician: No difficulties    Cognition  Overall Cognitive Status: Appears within functional limits for tasks assessed/performed Arousal/Alertness: Awake/alert Orientation Level: Appears intact for tasks assessed Behavior During Session: [facility] for tasks performed    Extremity/Trunk Assessment Right Lower Extremity Assessment RLE ROM/Strength/Tone: Unable to fully assess;Due to pain RLE Sensation: WFL - Light Touch Left Lower Extremity Assessment LLE ROM/Strength/Tone: WFL for tasks assessed LLE Sensation: WFL - Light Touch LLE Coordination: WFL - gross/fine motor Trunk Assessment Trunk Assessment: Kyphotic   Balance Balance Balance Assessed: Yes Static Sitting Balance Static Sitting - Balance Support: Bilateral upper extremity supported;Feet supported Static Sitting - Level of Assistance: 5: Stand by assistance;4: Min assist;3: Mod assist Static Sitting - Comment/# of Minutes: Varying levels of assist due to pt tendency to lean heavily to left side to avoid R hip.   End of Session PT - End of Session Equipment Utilized During Treatment: Gait belt Activity Tolerance: Patient limited by pain Patient left: in chair;with call bell/phone within reach Nurse Communication: Mobility status  GP     Page, Meribeth Mattes 08/31/2012, 10:19 AM

## 2012-08-31 NOTE — Progress Notes (Signed)
Subjective: 1 Day Post-Op Procedure(s) (LRB): OPEN REDUCTION INTERNAL FIXATION (ORIF) DISTAL FEMUR FRACTURE (Right) Patient reports pain as 4 on 0-10 scale.    Objective: Vital signs in last 24 hours: Temp:  [97.3 F (36.3 C)-98.5 F (36.9 C)] 98.2 F (36.8 C) (12/08 0455) Pulse Rate:  [79-102] 89  (12/08 0455) Resp:  [13-22] 20  (12/08 0455) BP: (111-162)/(52-98) 124/71 mmHg (12/08 0455) SpO2:  [98 %-100 %] 100 % (12/08 0455)  Intake/Output from previous day: 12/07 0701 - 12/08 0700 In: 2120 [P.O.:420; I.V.:1700] Out: 1190 [Urine:1040; Blood:150] Intake/Output this shift:     Basename 08/31/12 0448 08/29/12 0402  HGB 10.2* 11.7*    Basename 08/31/12 0448 08/29/12 0402  WBC 6.7 7.8  RBC 3.31* 3.77*  HCT 31.2* 35.0*  PLT 223 217    Basename 08/31/12 0448 08/29/12 0402  NA 135 134*  K 4.2 4.0  CL 101 101  CO2 27 26  BUN 14 16  CREATININE 0.69 0.65  GLUCOSE 151* 160*  CALCIUM 8.3* 8.7   No results found for this basename: LABPT:2,INR:2 in the last 72 hours  Dorsiflexion/Plantar flexion intact  Assessment/Plan: 1 Day Post-Op Procedure(s) (LRB): OPEN REDUCTION INTERNAL FIXATION (ORIF) DISTAL FEMUR FRACTURE (Right) Discharge to SNFAwaiting Discharge. Cassie Henkels A 08/31/2012, 8:40 AM

## 2012-08-31 NOTE — Discharge Summary (Signed)
Physician Discharge Summary  [name]:[id number] DOB: Apr 28, 1929 DOA: 08/28/2012  PCP: Londell Moh, MD  Admit date: 08/28/2012 Discharge date: 09/01/2012  Time spent: 25 minutes  Recommendations for Outpatient Follow-up:  1. Patient is being discharged to a short-term skilled nursing facility for rehabilitation 2. She'll follow up with orthopedic surgery in a few weeks 3. She'll follow up with her primary care doctor in one month's time  Discharge Diagnoses:  Principal Problem:  *Closed right hip fracture Active Problems:  HYPOTHYROIDISM  DIABETES MELLITUS, TYPE II  HYPERLIPIDEMIA  HYPERTENSION  Rheumatoid arthritis   Discharge Condition: Improved, being discharged to skilled nursing facility  Diet recommendation: Carb modified heart healthy  Filed Weights   08/28/12 0700  Weight: 61.689 kg (136 lb)    History of present illness:  78 year-old female with history of diabetes mellitus type 2, rheumatoid arthritis, hypothyroidism was brought to the ER after patient had sustained a fall at her house. Patient states she was sitting on a chair and was getting ready to go sleep and she walked and slipped and fell. He is losing consciousness. She called her neighbors and was brought to the ER. X-rays reveal fracture of the right femoral intertrochanteric. She is admitted to the hospitalist service with orthopedic consultation.  Hospital Course:  Principal Problem:  *Closed right hip fracture: Due to scheduling issues, patient underwent surgery on 12/7. She tolerated ORIF without incident. On subsequent days, her hemoglobin her main stable. Her pain was relatively well controlled with oral pain medications. She'll be discharged for short-term skilled nursing facility on 12/9. Active Problems:  HYPOTHYROIDISM: Stable. Continue on Synthroid  DIABETES MELLITUS, TYPE II: Mildly stable. Blood pressures range in the 150s to 170s.  HYPERLIPIDEMIA: Stable medical  issue  HYPERTENSION: Stable. Continued on her home blood pressure medications. Blood pressure most recently was 131/75  Rheumatoid arthritis: Stable. Continued on methotrexate and Plaquenil   Procedures:  Status post ORIF of right intertrochanteric femur fracture on 12/7  Consultations:  Olin-Orthopedic surgery  Discharge Exam: Filed Vitals:   08/30/12 2110 08/31/12 0130 08/31/12 0455 08/31/12 1345  BP: 131/75 124/72 124/71 119/54  Pulse: 96 91 89 105  Temp: 98.5 F (36.9 C) 98.4 F (36.9 C) 98.2 F (36.8 C) 98.7 F (37.1 C)  TempSrc: Oral Oral Oral Oral  Resp: 20 20 20 20   Height:      Weight:      SpO2: 100% 100% 100% 100%    General: Alert and oriented x3, no acute distress Cardiovascular: Regular rate and rhythm, S1-S2 Respiratory: Clear to auscultation bilaterally Abdomen: Soft, nontender, nondistended, positive bowel sounds Extremities: No clubbing or cyanosis, trace pitting edema  Discharge Instructions  Discharge Orders    Future Orders Please Complete By Expires   Diet - low sodium heart healthy      Increase activity slowly      Walk with assistance          Medication List     As of 08/31/2012  2:41 PM    TAKE these medications         calcium-vitamin D 500-200 MG-UNIT per tablet   Commonly known as: OSCAL WITH D   Take 2 tablets by mouth daily.      DSS 100 MG Caps   Take 100 mg by mouth 2 (two) times daily.      ferrous sulfate 325 (65 FE) MG tablet   Take 1 tablet (325 mg total) by mouth 3 (three) times daily after  meals.      folic acid 1 MG tablet   Commonly known as: FOLVITE   Take 1 mg by mouth daily.      glimepiride 1 MG tablet   Commonly known as: AMARYL   Take 0.5 mg by mouth daily.      HYDROcodone-acetaminophen 5-325 MG per tablet   Commonly known as: NORCO/VICODIN   Take 1-2 tablets by mouth every 6 (six) hours as needed.      hydroxychloroquine 200 MG tablet   Commonly known as: PLAQUENIL   Take 200 mg by mouth 2  (two) times daily.      levothyroxine 137 MCG tablet   Commonly known as: SYNTHROID, LEVOTHROID   Take 137 mcg by mouth daily.      LIVALO 2 MG Tabs   Generic drug: Pitavastatin Calcium   Take 2 mg by mouth 3 (three) times a week.      metFORMIN 500 MG tablet   Commonly known as: GLUCOPHAGE   Take 500 mg by mouth 2 (two) times daily.      methotrexate 2.5 MG tablet   Commonly known as: RHEUMATREX   Take 25 mg by mouth once a week. Take 10 pills by mouth every Wednesday      multivitamin with minerals Tabs   Take 1 tablet by mouth daily.      polyethylene glycol packet   Commonly known as: MIRALAX / GLYCOLAX   Take 17 g by mouth daily as needed.      telmisartan 80 MG tablet   Commonly known as: MICARDIS   Take 40 mg by mouth daily.           Follow-up Information    Follow up with Shelda Pal, MD. Schedule an appointment as soon as possible for a visit in 2 weeks.   Contact information:   [address], [address] [place] [address] [phone]           The results of significant diagnostics from this hospitalization (including imaging, microbiology, ancillary and laboratory) are listed below for reference.    Significant Diagnostic Studies: Dg Chest 1 View  08/28/2012    IMPRESSION: Minimal left basilar airspace opacity may reflect atelectasis or scarring; lungs otherwise clear.  No displaced rib fractures seen.   Original Report Authenticated By: Tonia Ghent, M.D.    Dg Hip Complete Right  08/28/2012   IMPRESSION: Mildly displaced comminuted right femoral intertrochanteric fracture, with a displaced lesser trochanteric fragment.   Original Report Authenticated By: Tonia Ghent, M.D.    Dg Hip Operative Right  08/30/2012  *RADIOLOGY REPORT*  Clinical Data: Fracture fixation.  DG OPERATIVE RIGHT HIP  Comparison: Plain films 08/28/2012.  Findings: Three fluoroscopic intraoperative spot views are provided.  Images  demonstrate placement of a dynamic hip screw and short IM nail with a single distal interlocking screw for fixation of an intertrochanteric fracture.  No complicating feature identified.  IMPRESSION: ORIF right intertrochanteric fracture.   Original Report Authenticated By: Holley Dexter, M.D.    Dg Pelvis Portable  08/30/2012    IMPRESSION: Status post internal repair of proximal right femoral fracture. Metallic fixation material in anatomic alignment.  Postsurgical changes are noted.   Original Report Authenticated By: Natasha Mead, M.D.    Dg Knee Complete 4 Views Right  08/28/2012  IMPRESSION:  1.  No evidence of fracture or dislocation. 2.  Tricompartmental osteoarthritis noted. 3.  Trace knee joint effusion seen. 4.  Diffuse  vascular calcifications noted.   Original Report Authenticated By: Tonia Ghent, M.D.     Microbiology: Recent Results (from the past 240 hour(s))  URINE CULTURE     Status: Normal   Collection Time   08/28/12  1:41 PM      Component Value Range Status Comment   Specimen Description URINE, CATHETERIZED   Final    Special Requests NONE   Final    Culture  Setup Time 08/28/2012 21:36   Final    Colony Count NO GROWTH   Final    Culture NO GROWTH   Final    Report Status 08/29/2012 FINAL   Final      Labs: Basic Metabolic Panel:  Lab 08/31/12 1308 08/29/12 0402 08/28/12 0444  NA 135 134* 137  K 4.2 4.0 3.7  CL 101 101 102  CO2 27 26 23   GLUCOSE 151* 160* 204*  BUN 14 16 25*  CREATININE 0.69 0.65 0.63  CALCIUM 8.3* 8.7 9.5  MG -- -- --  PHOS -- -- --   CBC:  Lab 08/31/12 0448 08/29/12 0402 08/28/12 0444  WBC 6.7 7.8 13.7*  NEUTROABS -- -- 12.4*  HGB 10.2* 11.7* 12.0  HCT 31.2* 35.0* 36.2  MCV 94.3 92.8 92.6  PLT 223 217 266   CBG:  Lab 08/31/12 1149 08/31/12 0738 08/30/12 2129 08/30/12 1729 08/30/12 1110  GLUCAP 178* 156* 166* 147* 188*       Signed:  Chane Cowden K  [facility] 08/31/2012, 2:41 PM

## 2012-09-01 DIAGNOSIS — S72009A Fracture of unspecified part of neck of unspecified femur, initial encounter for closed fracture: Secondary | ICD-10-CM | POA: Diagnosis not present

## 2012-09-01 DIAGNOSIS — S72009D Fracture of unspecified part of neck of unspecified femur, subsequent encounter for closed fracture with routine healing: Secondary | ICD-10-CM | POA: Diagnosis not present

## 2012-09-01 DIAGNOSIS — E785 Hyperlipidemia, unspecified: Secondary | ICD-10-CM | POA: Diagnosis not present

## 2012-09-01 DIAGNOSIS — R279 Unspecified lack of coordination: Secondary | ICD-10-CM | POA: Diagnosis not present

## 2012-09-01 DIAGNOSIS — S72143A Displaced intertrochanteric fracture of unspecified femur, initial encounter for closed fracture: Secondary | ICD-10-CM | POA: Diagnosis not present

## 2012-09-01 DIAGNOSIS — E1169 Type 2 diabetes mellitus with other specified complication: Secondary | ICD-10-CM | POA: Diagnosis not present

## 2012-09-01 DIAGNOSIS — R262 Difficulty in walking, not elsewhere classified: Secondary | ICD-10-CM | POA: Diagnosis not present

## 2012-09-01 DIAGNOSIS — S72109A Unspecified trochanteric fracture of unspecified femur, initial encounter for closed fracture: Secondary | ICD-10-CM | POA: Diagnosis not present

## 2012-09-01 DIAGNOSIS — M069 Rheumatoid arthritis, unspecified: Secondary | ICD-10-CM | POA: Diagnosis not present

## 2012-09-01 DIAGNOSIS — E559 Vitamin D deficiency, unspecified: Secondary | ICD-10-CM | POA: Diagnosis not present

## 2012-09-01 DIAGNOSIS — Z9889 Other specified postprocedural states: Secondary | ICD-10-CM | POA: Diagnosis not present

## 2012-09-01 DIAGNOSIS — M25559 Pain in unspecified hip: Secondary | ICD-10-CM | POA: Diagnosis not present

## 2012-09-01 DIAGNOSIS — E039 Hypothyroidism, unspecified: Secondary | ICD-10-CM | POA: Diagnosis not present

## 2012-09-01 DIAGNOSIS — M6281 Muscle weakness (generalized): Secondary | ICD-10-CM | POA: Diagnosis not present

## 2012-09-01 DIAGNOSIS — I10 Essential (primary) hypertension: Secondary | ICD-10-CM | POA: Diagnosis not present

## 2012-09-01 DIAGNOSIS — E119 Type 2 diabetes mellitus without complications: Secondary | ICD-10-CM | POA: Diagnosis not present

## 2012-09-01 LAB — CBC
HCT: 30.6 % — ABNORMAL LOW (ref 36.0–46.0)
RDW: 15.2 % (ref 11.5–15.5)
WBC: 9.1 10*3/uL (ref 4.0–10.5)

## 2012-09-01 LAB — BASIC METABOLIC PANEL
BUN: 17 mg/dL (ref 6–23)
Chloride: 101 mEq/L (ref 96–112)
GFR calc Af Amer: >90 mL/min (ref >90)
Potassium: 3.9 mEq/L (ref 3.5–5.1)

## 2012-09-01 LAB — GLUCOSE, CAPILLARY

## 2012-09-01 MED ORDER — ENOXAPARIN SODIUM 40 MG/0.4ML ~~LOC~~ SOLN: 40.0000 mg | SUBCUTANEOUS | Status: DC

## 2012-09-01 NOTE — Progress Notes (Addendum)
CSW met with patient and gave bed offers. Patient agreeable to [facility]. Packet copied and placed in [place].  Ayce Pietrzyk C. Shulem Mader MSW, LCSW [phone] ptar called for transportation.  Lavender Stanke C. Jaide Hillenburg MSW, LCSW [phone]

## 2012-09-01 NOTE — Care Management Note (Signed)
    Page 1 of 2   09/01/2012     12:28:24 PM   CARE MANAGEMENT NOTE 09/01/2012  Patient:  [name], [name]   Account Number:  [id number]  Date Initiated:  09/01/2012  Documentation initiated by:  Colleen Can  Subjective/Objective Assessment:   DX RT FENURE FRACTURE; ORIF     Action/Plan:   SNR REHAB   Anticipated DC Date:  09/01/2012   Anticipated DC Plan:  SKILLED NURSING FACILITY  In-house referral  Clinical Social Worker      DC Planning Services  CM consult      [facility] Choice  NA   Choice offered to / List presented to:  NA   DME arranged  NA      DME agency  NA     HH arranged  NA      HH agency  NA   Status of service:  Completed, signed off Medicare Important Message given?   (If response is "NO", the following Medicare IM given date fields will be blank) Date Medicare IM given:   Date Additional Medicare IM given:    Discharge Disposition:    Per UR Regulation:    If discussed at Long Length of Stay Meetings, dates discussed:    Comments:

## 2012-09-01 NOTE — Progress Notes (Signed)
   Subjective: 2 Days Post-Op Procedure(s) (LRB): OPEN REDUCTION INTERNAL FIXATION (ORIF) DISTAL FEMUR FRACTURE (Right)   Patient reports pain as mild, pain well controlled. Slight increase while standing and trying to bear weight. No events throughout the night.   Objective:   VITALS:   Filed Vitals:   09/01/12 0430  BP: 125/71  Pulse: 100  Temp: 98.7 F (37.1 C)  Resp: 18    Neurovascular intact Dorsiflexion/Plantar flexion intact Incision: dressing C/D/I No cellulitis present Compartment soft  LABS  Basename 09/01/12 0455 08/31/12 0448  HGB 10.2* 10.2*  HCT 30.6* 31.2*  WBC 9.1 6.7  PLT 231 223     Basename 09/01/12 0455 08/31/12 0448  NA 135 135  K 3.9 4.2  BUN 17 14  CREATININE 0.57 0.69  GLUCOSE 202* 151*     Assessment/Plan: 2 Days Post-Op Procedure(s) (LRB): OPEN REDUCTION INTERNAL FIXATION (ORIF) DISTAL FEMUR FRACTURE (Right)  Up with therapy Discharge to SNF when cleared medically 50% wb right leg Lovenox for 2 weeks for anticoagulation Hydrocodone for pain Follow up in 2 weeks at [facility]. Follow up with OLIN,Keino Placencia D in 2 weeks.  Contact information:  [facility] [address], [address] [place] [address] [phone]        Anastasio Auerbach. Leovanni Bjorkman   PAC  09/01/2012, 9:56 AM

## 2012-09-01 NOTE — Progress Notes (Signed)
OT Cancellation Note  Patient Details Name: ADRIEANA FENNELLY MRN: [id number] DOB: 08-May-1929   Cancelled Treatment:    Reason Eval/Treat Not Completed: Other (comment) (defer OT eval to snf. Will sign off at this time.)  Clarissia Mckeen A OTR/L [phone] 09/01/2012, 11:01 AM

## 2012-09-01 NOTE — Progress Notes (Signed)
Patient doing okay. Complaining of some mild discomfort at hip fracture site, but nothing too severe. For skilled nursing facility today. Stable. Noted minimally elevated heart rate-more consistent with pain. On exam, lungs are clear. She is not hypoxic.

## 2012-09-01 NOTE — Progress Notes (Signed)
Physical Therapy Treatment Patient Details Name: [name] MRN: [id number] DOB: [birth year] Today's Date: 09/01/2012 Time: 1035-1100 PT Time Calculation (min): 25 min  PT Assessment / Plan / Recommendation Comments on Treatment Session  R ORIF 2nd fall/fx POD # 2.  Assisted pt OOB to recliner then performed TE's.  Pt plans to D/C to SNF.    Follow Up Recommendations  SNF     Does the patient have the potential to tolerate intense rehabilitation     Barriers to Discharge        Equipment Recommendations  Rolling walker with 5" wheels    Recommendations for Other Services    Frequency Min 3X/week   Plan      Precautions / Restrictions Restrictions Weight Bearing Restrictions: Yes RLE Weight Bearing: Partial weight bearing RLE Partial Weight Bearing Percentage or Pounds: 50% WB   Pertinent Vitals/Pain C/o "aolt" L hip pain with activity ICE applied    Mobility  Bed Mobility Bed Mobility: Supine to Sit;Sitting - Scoot to Edge of Bed Supine to Sit: 1: +1 Total assist Supine to Sit: Patient Percentage: 50% Sitting - Scoot to Edge of Bed: 1: +1 Total assist Sitting - Scoot to Edge of Bed: Patient Percentage: 50% Details for Bed Mobility Assistance: Increased time and total assist + 1 with use of bed pad to transition pt from supine to EOB. Pt demon mild anxiety and requires increased time. Transfers Transfers: Sit to Stand;Stand to Sit Sit to Stand: 1: +2 Total assist;From bed Sit to Stand: Patient Percentage: 40% Stand to Sit: 1: +2 Total assist;To chair/3-in-1 Stand to Sit: Patient Percentage: 40% Details for Transfer Assistance: 75% VC's on proper tech and hand placement plus MAX cueing to decrease fear/anxiety. Instructed pt on PWB and max encouragement to due so. Ambulation/Gait Ambulation/Gait Assistance: 1: +2 Total assist Ambulation/Gait: Patient Percentage: 50% Ambulation Distance (Feet): 2 Feet Assistive device: Rolling walker Ambulation/Gait  Assistance Details: 75% Vc's on upright posture, gait sequencing and safety with turns using an AD. Gait Pattern: Step-to pattern;Decreased stance time - right;Trunk flexed;Shuffle Gait velocity: decreased    Exercises Total Joint Exercises Ankle Circles/Pumps: AROM;Both;10 reps;Seated Quad Sets: AROM;Both;10 reps;Seated Gluteal Sets: AROM;Both;10 reps;Seated    PT Goals                                                  progressing   Visit Information  Last PT Received On: 09/01/12 Assistance Needed: +2    Subjective Data  Subjective: I need to use the bathroom Patient Stated Goal: walk   Cognition       Balance     End of Session PT - End of Session Equipment Utilized During Treatment: Gait belt Activity Tolerance: Patient limited by fatigue;Patient limited by pain Patient left: in chair;with call bell/phone within reach (ICE to L hip)   Felecia Shelling  PTA [facility]  Acute  Rehab Pager     [phone]

## 2012-09-02 ENCOUNTER — Encounter (HOSPITAL_COMMUNITY): Payer: Self-pay | Admitting: Orthopedic Surgery

## 2012-09-03 DIAGNOSIS — I10 Essential (primary) hypertension: Secondary | ICD-10-CM | POA: Diagnosis not present

## 2012-09-03 DIAGNOSIS — E039 Hypothyroidism, unspecified: Secondary | ICD-10-CM | POA: Diagnosis not present

## 2012-09-03 DIAGNOSIS — M069 Rheumatoid arthritis, unspecified: Secondary | ICD-10-CM | POA: Diagnosis not present

## 2012-09-03 DIAGNOSIS — E119 Type 2 diabetes mellitus without complications: Secondary | ICD-10-CM | POA: Diagnosis not present

## 2012-09-05 DIAGNOSIS — S72109A Unspecified trochanteric fracture of unspecified femur, initial encounter for closed fracture: Secondary | ICD-10-CM | POA: Diagnosis not present

## 2012-09-05 DIAGNOSIS — E119 Type 2 diabetes mellitus without complications: Secondary | ICD-10-CM | POA: Diagnosis not present

## 2012-09-05 DIAGNOSIS — E039 Hypothyroidism, unspecified: Secondary | ICD-10-CM | POA: Diagnosis not present

## 2012-09-12 DIAGNOSIS — Z9889 Other specified postprocedural states: Secondary | ICD-10-CM | POA: Diagnosis not present

## 2012-09-18 DIAGNOSIS — M069 Rheumatoid arthritis, unspecified: Secondary | ICD-10-CM | POA: Diagnosis not present

## 2012-09-18 DIAGNOSIS — Z9181 History of falling: Secondary | ICD-10-CM | POA: Diagnosis not present

## 2012-09-18 DIAGNOSIS — I499 Cardiac arrhythmia, unspecified: Secondary | ICD-10-CM | POA: Diagnosis not present

## 2012-09-18 DIAGNOSIS — I10 Essential (primary) hypertension: Secondary | ICD-10-CM | POA: Diagnosis not present

## 2012-09-18 DIAGNOSIS — S72009D Fracture of unspecified part of neck of unspecified femur, subsequent encounter for closed fracture with routine healing: Secondary | ICD-10-CM | POA: Diagnosis not present

## 2012-09-18 DIAGNOSIS — E119 Type 2 diabetes mellitus without complications: Secondary | ICD-10-CM | POA: Diagnosis not present

## 2012-09-22 DIAGNOSIS — I10 Essential (primary) hypertension: Secondary | ICD-10-CM | POA: Diagnosis not present

## 2012-09-22 DIAGNOSIS — E119 Type 2 diabetes mellitus without complications: Secondary | ICD-10-CM | POA: Diagnosis not present

## 2012-09-22 DIAGNOSIS — M069 Rheumatoid arthritis, unspecified: Secondary | ICD-10-CM | POA: Diagnosis not present

## 2012-09-22 DIAGNOSIS — S72009D Fracture of unspecified part of neck of unspecified femur, subsequent encounter for closed fracture with routine healing: Secondary | ICD-10-CM | POA: Diagnosis not present

## 2012-09-22 DIAGNOSIS — Z9181 History of falling: Secondary | ICD-10-CM | POA: Diagnosis not present

## 2012-09-22 DIAGNOSIS — I499 Cardiac arrhythmia, unspecified: Secondary | ICD-10-CM | POA: Diagnosis not present

## 2012-09-23 DIAGNOSIS — Z9181 History of falling: Secondary | ICD-10-CM | POA: Diagnosis not present

## 2012-09-23 DIAGNOSIS — E119 Type 2 diabetes mellitus without complications: Secondary | ICD-10-CM | POA: Diagnosis not present

## 2012-09-23 DIAGNOSIS — I499 Cardiac arrhythmia, unspecified: Secondary | ICD-10-CM | POA: Diagnosis not present

## 2012-09-23 DIAGNOSIS — S72009D Fracture of unspecified part of neck of unspecified femur, subsequent encounter for closed fracture with routine healing: Secondary | ICD-10-CM | POA: Diagnosis not present

## 2012-09-23 DIAGNOSIS — I10 Essential (primary) hypertension: Secondary | ICD-10-CM | POA: Diagnosis not present

## 2012-09-23 DIAGNOSIS — M069 Rheumatoid arthritis, unspecified: Secondary | ICD-10-CM | POA: Diagnosis not present

## 2012-09-24 DIAGNOSIS — S72009D Fracture of unspecified part of neck of unspecified femur, subsequent encounter for closed fracture with routine healing: Secondary | ICD-10-CM | POA: Diagnosis not present

## 2012-09-24 DIAGNOSIS — I499 Cardiac arrhythmia, unspecified: Secondary | ICD-10-CM | POA: Diagnosis not present

## 2012-09-24 DIAGNOSIS — M069 Rheumatoid arthritis, unspecified: Secondary | ICD-10-CM | POA: Diagnosis not present

## 2012-09-24 DIAGNOSIS — E119 Type 2 diabetes mellitus without complications: Secondary | ICD-10-CM | POA: Diagnosis not present

## 2012-09-24 DIAGNOSIS — I10 Essential (primary) hypertension: Secondary | ICD-10-CM | POA: Diagnosis not present

## 2012-09-24 DIAGNOSIS — Z9181 History of falling: Secondary | ICD-10-CM | POA: Diagnosis not present

## 2012-09-25 DIAGNOSIS — E119 Type 2 diabetes mellitus without complications: Secondary | ICD-10-CM | POA: Diagnosis not present

## 2012-09-25 DIAGNOSIS — S72009D Fracture of unspecified part of neck of unspecified femur, subsequent encounter for closed fracture with routine healing: Secondary | ICD-10-CM | POA: Diagnosis not present

## 2012-09-25 DIAGNOSIS — I10 Essential (primary) hypertension: Secondary | ICD-10-CM | POA: Diagnosis not present

## 2012-09-25 DIAGNOSIS — Z9181 History of falling: Secondary | ICD-10-CM | POA: Diagnosis not present

## 2012-09-25 DIAGNOSIS — I499 Cardiac arrhythmia, unspecified: Secondary | ICD-10-CM | POA: Diagnosis not present

## 2012-09-25 DIAGNOSIS — M069 Rheumatoid arthritis, unspecified: Secondary | ICD-10-CM | POA: Diagnosis not present

## 2012-09-26 DIAGNOSIS — S72009D Fracture of unspecified part of neck of unspecified femur, subsequent encounter for closed fracture with routine healing: Secondary | ICD-10-CM | POA: Diagnosis not present

## 2012-09-26 DIAGNOSIS — I499 Cardiac arrhythmia, unspecified: Secondary | ICD-10-CM | POA: Diagnosis not present

## 2012-09-26 DIAGNOSIS — E119 Type 2 diabetes mellitus without complications: Secondary | ICD-10-CM | POA: Diagnosis not present

## 2012-09-26 DIAGNOSIS — I10 Essential (primary) hypertension: Secondary | ICD-10-CM | POA: Diagnosis not present

## 2012-09-26 DIAGNOSIS — M069 Rheumatoid arthritis, unspecified: Secondary | ICD-10-CM | POA: Diagnosis not present

## 2012-09-26 DIAGNOSIS — Z9181 History of falling: Secondary | ICD-10-CM | POA: Diagnosis not present

## 2012-09-29 DIAGNOSIS — I499 Cardiac arrhythmia, unspecified: Secondary | ICD-10-CM | POA: Diagnosis not present

## 2012-09-29 DIAGNOSIS — M069 Rheumatoid arthritis, unspecified: Secondary | ICD-10-CM | POA: Diagnosis not present

## 2012-09-29 DIAGNOSIS — Z9181 History of falling: Secondary | ICD-10-CM | POA: Diagnosis not present

## 2012-09-29 DIAGNOSIS — I10 Essential (primary) hypertension: Secondary | ICD-10-CM | POA: Diagnosis not present

## 2012-09-29 DIAGNOSIS — S72009D Fracture of unspecified part of neck of unspecified femur, subsequent encounter for closed fracture with routine healing: Secondary | ICD-10-CM | POA: Diagnosis not present

## 2012-09-29 DIAGNOSIS — E119 Type 2 diabetes mellitus without complications: Secondary | ICD-10-CM | POA: Diagnosis not present

## 2012-09-30 DIAGNOSIS — I10 Essential (primary) hypertension: Secondary | ICD-10-CM | POA: Diagnosis not present

## 2012-09-30 DIAGNOSIS — S72009D Fracture of unspecified part of neck of unspecified femur, subsequent encounter for closed fracture with routine healing: Secondary | ICD-10-CM | POA: Diagnosis not present

## 2012-09-30 DIAGNOSIS — I499 Cardiac arrhythmia, unspecified: Secondary | ICD-10-CM | POA: Diagnosis not present

## 2012-09-30 DIAGNOSIS — M069 Rheumatoid arthritis, unspecified: Secondary | ICD-10-CM | POA: Diagnosis not present

## 2012-09-30 DIAGNOSIS — E119 Type 2 diabetes mellitus without complications: Secondary | ICD-10-CM | POA: Diagnosis not present

## 2012-09-30 DIAGNOSIS — Z9181 History of falling: Secondary | ICD-10-CM | POA: Diagnosis not present

## 2012-10-01 DIAGNOSIS — M069 Rheumatoid arthritis, unspecified: Secondary | ICD-10-CM | POA: Diagnosis not present

## 2012-10-01 DIAGNOSIS — Z9181 History of falling: Secondary | ICD-10-CM | POA: Diagnosis not present

## 2012-10-01 DIAGNOSIS — E119 Type 2 diabetes mellitus without complications: Secondary | ICD-10-CM | POA: Diagnosis not present

## 2012-10-01 DIAGNOSIS — S72009D Fracture of unspecified part of neck of unspecified femur, subsequent encounter for closed fracture with routine healing: Secondary | ICD-10-CM | POA: Diagnosis not present

## 2012-10-01 DIAGNOSIS — I499 Cardiac arrhythmia, unspecified: Secondary | ICD-10-CM | POA: Diagnosis not present

## 2012-10-01 DIAGNOSIS — I10 Essential (primary) hypertension: Secondary | ICD-10-CM | POA: Diagnosis not present

## 2012-10-03 DIAGNOSIS — M069 Rheumatoid arthritis, unspecified: Secondary | ICD-10-CM | POA: Diagnosis not present

## 2012-10-03 DIAGNOSIS — Z9181 History of falling: Secondary | ICD-10-CM | POA: Diagnosis not present

## 2012-10-03 DIAGNOSIS — E119 Type 2 diabetes mellitus without complications: Secondary | ICD-10-CM | POA: Diagnosis not present

## 2012-10-03 DIAGNOSIS — S72009D Fracture of unspecified part of neck of unspecified femur, subsequent encounter for closed fracture with routine healing: Secondary | ICD-10-CM | POA: Diagnosis not present

## 2012-10-03 DIAGNOSIS — I499 Cardiac arrhythmia, unspecified: Secondary | ICD-10-CM | POA: Diagnosis not present

## 2012-10-03 DIAGNOSIS — I10 Essential (primary) hypertension: Secondary | ICD-10-CM | POA: Diagnosis not present

## 2012-10-07 DIAGNOSIS — E119 Type 2 diabetes mellitus without complications: Secondary | ICD-10-CM | POA: Diagnosis not present

## 2012-10-07 DIAGNOSIS — M069 Rheumatoid arthritis, unspecified: Secondary | ICD-10-CM | POA: Diagnosis not present

## 2012-10-07 DIAGNOSIS — I499 Cardiac arrhythmia, unspecified: Secondary | ICD-10-CM | POA: Diagnosis not present

## 2012-10-07 DIAGNOSIS — Z9181 History of falling: Secondary | ICD-10-CM | POA: Diagnosis not present

## 2012-10-07 DIAGNOSIS — S72009D Fracture of unspecified part of neck of unspecified femur, subsequent encounter for closed fracture with routine healing: Secondary | ICD-10-CM | POA: Diagnosis not present

## 2012-10-07 DIAGNOSIS — I10 Essential (primary) hypertension: Secondary | ICD-10-CM | POA: Diagnosis not present

## 2012-10-08 DIAGNOSIS — M1710 Unilateral primary osteoarthritis, unspecified knee: Secondary | ICD-10-CM | POA: Diagnosis not present

## 2012-10-09 DIAGNOSIS — M069 Rheumatoid arthritis, unspecified: Secondary | ICD-10-CM | POA: Diagnosis not present

## 2012-10-09 DIAGNOSIS — I499 Cardiac arrhythmia, unspecified: Secondary | ICD-10-CM | POA: Diagnosis not present

## 2012-10-09 DIAGNOSIS — I10 Essential (primary) hypertension: Secondary | ICD-10-CM | POA: Diagnosis not present

## 2012-10-09 DIAGNOSIS — S72009D Fracture of unspecified part of neck of unspecified femur, subsequent encounter for closed fracture with routine healing: Secondary | ICD-10-CM | POA: Diagnosis not present

## 2012-10-09 DIAGNOSIS — Z9181 History of falling: Secondary | ICD-10-CM | POA: Diagnosis not present

## 2012-10-09 DIAGNOSIS — E119 Type 2 diabetes mellitus without complications: Secondary | ICD-10-CM | POA: Diagnosis not present

## 2012-10-10 DIAGNOSIS — Z9181 History of falling: Secondary | ICD-10-CM | POA: Diagnosis not present

## 2012-10-10 DIAGNOSIS — I10 Essential (primary) hypertension: Secondary | ICD-10-CM | POA: Diagnosis not present

## 2012-10-10 DIAGNOSIS — M069 Rheumatoid arthritis, unspecified: Secondary | ICD-10-CM | POA: Diagnosis not present

## 2012-10-10 DIAGNOSIS — E119 Type 2 diabetes mellitus without complications: Secondary | ICD-10-CM | POA: Diagnosis not present

## 2012-10-10 DIAGNOSIS — I499 Cardiac arrhythmia, unspecified: Secondary | ICD-10-CM | POA: Diagnosis not present

## 2012-10-10 DIAGNOSIS — S72009D Fracture of unspecified part of neck of unspecified femur, subsequent encounter for closed fracture with routine healing: Secondary | ICD-10-CM | POA: Diagnosis not present

## 2012-10-13 DIAGNOSIS — E119 Type 2 diabetes mellitus without complications: Secondary | ICD-10-CM | POA: Diagnosis not present

## 2012-10-13 DIAGNOSIS — IMO0001 Reserved for inherently not codable concepts without codable children: Secondary | ICD-10-CM | POA: Diagnosis not present

## 2012-10-13 DIAGNOSIS — S72009D Fracture of unspecified part of neck of unspecified femur, subsequent encounter for closed fracture with routine healing: Secondary | ICD-10-CM | POA: Diagnosis not present

## 2012-10-13 DIAGNOSIS — M1710 Unilateral primary osteoarthritis, unspecified knee: Secondary | ICD-10-CM | POA: Diagnosis not present

## 2012-10-15 DIAGNOSIS — S72009D Fracture of unspecified part of neck of unspecified femur, subsequent encounter for closed fracture with routine healing: Secondary | ICD-10-CM | POA: Diagnosis not present

## 2012-10-15 DIAGNOSIS — IMO0001 Reserved for inherently not codable concepts without codable children: Secondary | ICD-10-CM | POA: Diagnosis not present

## 2012-10-15 DIAGNOSIS — E119 Type 2 diabetes mellitus without complications: Secondary | ICD-10-CM | POA: Diagnosis not present

## 2012-10-17 DIAGNOSIS — B351 Tinea unguium: Secondary | ICD-10-CM | POA: Diagnosis not present

## 2012-10-17 DIAGNOSIS — S72009D Fracture of unspecified part of neck of unspecified femur, subsequent encounter for closed fracture with routine healing: Secondary | ICD-10-CM | POA: Diagnosis not present

## 2012-10-17 DIAGNOSIS — Q828 Other specified congenital malformations of skin: Secondary | ICD-10-CM | POA: Diagnosis not present

## 2012-10-17 DIAGNOSIS — IMO0001 Reserved for inherently not codable concepts without codable children: Secondary | ICD-10-CM | POA: Diagnosis not present

## 2012-10-17 DIAGNOSIS — M79609 Pain in unspecified limb: Secondary | ICD-10-CM | POA: Diagnosis not present

## 2012-10-17 DIAGNOSIS — E119 Type 2 diabetes mellitus without complications: Secondary | ICD-10-CM | POA: Diagnosis not present

## 2012-10-19 ENCOUNTER — Encounter (HOSPITAL_COMMUNITY): Payer: Self-pay | Admitting: *Deleted

## 2012-10-19 ENCOUNTER — Telehealth: Payer: Self-pay | Admitting: Gastroenterology

## 2012-10-19 ENCOUNTER — Inpatient Hospital Stay (HOSPITAL_COMMUNITY)
Admission: EM | Admit: 2012-10-19 | Discharge: 2012-10-22 | DRG: 377 | Disposition: A | Discharge status: Home Health | Payer: Medicare Other | Attending: Internal Medicine | Admitting: Internal Medicine

## 2012-10-19 DIAGNOSIS — S72001A Fracture of unspecified part of neck of right femur, initial encounter for closed fracture: Secondary | ICD-10-CM

## 2012-10-19 DIAGNOSIS — E119 Type 2 diabetes mellitus without complications: Secondary | ICD-10-CM | POA: Diagnosis present

## 2012-10-19 DIAGNOSIS — I10 Essential (primary) hypertension: Secondary | ICD-10-CM | POA: Diagnosis not present

## 2012-10-19 DIAGNOSIS — K5731 Diverticulosis of large intestine without perforation or abscess with bleeding: Principal | ICD-10-CM | POA: Diagnosis present

## 2012-10-19 DIAGNOSIS — K922 Gastrointestinal hemorrhage, unspecified: Secondary | ICD-10-CM | POA: Diagnosis not present

## 2012-10-19 DIAGNOSIS — K5790 Diverticulosis of intestine, part unspecified, without perforation or abscess without bleeding: Secondary | ICD-10-CM

## 2012-10-19 DIAGNOSIS — F329 Major depressive disorder, single episode, unspecified: Secondary | ICD-10-CM | POA: Diagnosis present

## 2012-10-19 DIAGNOSIS — K5730 Diverticulosis of large intestine without perforation or abscess without bleeding: Secondary | ICD-10-CM | POA: Diagnosis present

## 2012-10-19 DIAGNOSIS — E039 Hypothyroidism, unspecified: Secondary | ICD-10-CM | POA: Diagnosis present

## 2012-10-19 DIAGNOSIS — M05741 Rheumatoid arthritis with rheumatoid factor of right hand without organ or systems involvement: Secondary | ICD-10-CM | POA: Diagnosis present

## 2012-10-19 DIAGNOSIS — M069 Rheumatoid arthritis, unspecified: Secondary | ICD-10-CM | POA: Diagnosis present

## 2012-10-19 DIAGNOSIS — Z79899 Other long term (current) drug therapy: Secondary | ICD-10-CM

## 2012-10-19 DIAGNOSIS — D649 Anemia, unspecified: Secondary | ICD-10-CM

## 2012-10-19 DIAGNOSIS — E785 Hyperlipidemia, unspecified: Secondary | ICD-10-CM | POA: Diagnosis present

## 2012-10-19 DIAGNOSIS — F3289 Other specified depressive episodes: Secondary | ICD-10-CM | POA: Diagnosis present

## 2012-10-19 DIAGNOSIS — IMO0002 Reserved for concepts with insufficient information to code with codable children: Secondary | ICD-10-CM

## 2012-10-19 DIAGNOSIS — E1169 Type 2 diabetes mellitus with other specified complication: Secondary | ICD-10-CM | POA: Diagnosis not present

## 2012-10-19 DIAGNOSIS — K219 Gastro-esophageal reflux disease without esophagitis: Secondary | ICD-10-CM | POA: Diagnosis present

## 2012-10-19 DIAGNOSIS — E43 Unspecified severe protein-calorie malnutrition: Secondary | ICD-10-CM | POA: Diagnosis present

## 2012-10-19 LAB — COMPREHENSIVE METABOLIC PANEL
Alkaline Phosphatase: 118 U/L — ABNORMAL HIGH (ref 39–117)
BUN: 18 mg/dL (ref 6–23)
Chloride: 102 mEq/L (ref 96–112)
GFR calc Af Amer: >90 mL/min (ref >90)
GFR calc non Af Amer: 79 mL/min — ABNORMAL LOW (ref >90)
Glucose, Bld: 148 mg/dL — ABNORMAL HIGH (ref 70–99)
Potassium: 4.2 mEq/L (ref 3.5–5.1)
Total Bilirubin: 0.3 mg/dL (ref 0.3–1.2)

## 2012-10-19 LAB — TYPE AND SCREEN: ABO/RH(D): O POS

## 2012-10-19 LAB — CBC WITH DIFFERENTIAL/PLATELET
Basophils Absolute: 0.1 10*3/uL (ref 0.0–0.1)
Eosinophils Absolute: 0.2 10*3/uL (ref 0.0–0.7)
Eosinophils Relative: 1 % (ref 0–5)
Lymphs Abs: 1.2 10*3/uL (ref 0.7–4.0)
MCH: 31.1 pg (ref 26.0–34.0)
MCV: 95.9 fL (ref 78.0–100.0)
Platelets: 310 10*3/uL (ref 150–400)
RDW: 16.2 % — ABNORMAL HIGH (ref 11.5–15.5)

## 2012-10-19 LAB — PROTIME-INR: Prothrombin Time: 12.3 seconds (ref 11.6–15.2)

## 2012-10-19 MED ORDER — CALCIUM CARBONATE-VITAMIN D 500-200 MG-UNIT PO TABS
2.0000 | ORAL_TABLET | Freq: Every day | ORAL | Status: DC
  Administered 2012-10-20 – 2012-10-22 (×3): 2 via ORAL
  Filled 2012-10-19 (×3): qty 2

## 2012-10-19 MED ORDER — METHOTREXATE 2.5 MG PO TABS
25.0000 mg | ORAL_TABLET | ORAL | Status: DC
  Administered 2012-10-22: 25 mg via ORAL
  Filled 2012-10-19: qty 10

## 2012-10-19 MED ORDER — SODIUM CHLORIDE 0.9 % IV BOLUS (SEPSIS)
500.0000 mL | Freq: Once | INTRAVENOUS | Status: AC
  Administered 2012-10-19: 500 mL via INTRAVENOUS

## 2012-10-19 MED ORDER — METHOTREXATE (ANTI-RHEUMATIC) 2.5 MG PO TABS: 25.0000 mg | ORAL_TABLET | ORAL | Status: DC

## 2012-10-19 MED ORDER — HYDROXYCHLOROQUINE SULFATE 200 MG PO TABS
200.0000 mg | ORAL_TABLET | Freq: Two times a day (BID) | ORAL | Status: DC
  Administered 2012-10-20 – 2012-10-22 (×5): 200 mg via ORAL
  Filled 2012-10-19 (×8): qty 1

## 2012-10-19 MED ORDER — LEVOTHYROXINE SODIUM 137 MCG PO TABS
137.0000 ug | ORAL_TABLET | Freq: Every day | ORAL | Status: DC
  Administered 2012-10-20 – 2012-10-22 (×3): 137 ug via ORAL
  Filled 2012-10-19 (×6): qty 1

## 2012-10-19 MED ORDER — FOLIC ACID 1 MG PO TABS
1.0000 mg | ORAL_TABLET | Freq: Every day | ORAL | Status: DC
  Administered 2012-10-20 – 2012-10-22 (×3): 1 mg via ORAL
  Filled 2012-10-19 (×3): qty 1

## 2012-10-19 MED ORDER — METFORMIN HCL 500 MG PO TABS
500.0000 mg | ORAL_TABLET | Freq: Two times a day (BID) | ORAL | Status: DC
  Administered 2012-10-20 – 2012-10-22 (×5): 500 mg via ORAL
  Filled 2012-10-19 (×8): qty 1

## 2012-10-19 MED ORDER — SODIUM CHLORIDE 0.9 % IJ SOLN
3.0000 mL | Freq: Two times a day (BID) | INTRAMUSCULAR | Status: DC
  Administered 2012-10-20 – 2012-10-21 (×2): 3 mL via INTRAVENOUS

## 2012-10-19 MED ORDER — GLIMEPIRIDE 1 MG PO TABS
0.5000 mg | ORAL_TABLET | Freq: Every day | ORAL | Status: DC
  Administered 2012-10-20 – 2012-10-22 (×3): 0.5 mg via ORAL
  Filled 2012-10-19 (×5): qty 0.5

## 2012-10-19 MED ORDER — ADULT MULTIVITAMIN W/MINERALS CH
1.0000 | ORAL_TABLET | Freq: Every day | ORAL | Status: DC
  Administered 2012-10-20 – 2012-10-22 (×3): 1 via ORAL
  Filled 2012-10-19 (×3): qty 1

## 2012-10-19 MED ORDER — IRBESARTAN 300 MG PO TABS
300.0000 mg | ORAL_TABLET | Freq: Every day | ORAL | Status: DC
  Administered 2012-10-21: 300 mg via ORAL
  Filled 2012-10-19 (×3): qty 1

## 2012-10-19 MED ORDER — ATORVASTATIN CALCIUM 10 MG PO TABS
10.0000 mg | ORAL_TABLET | Freq: Every day | ORAL | Status: DC
  Filled 2012-10-19: qty 1

## 2012-10-19 MED ORDER — SODIUM CHLORIDE 0.9 % IV SOLN
INTRAVENOUS | Status: DC
  Administered 2012-10-20 (×2): via INTRAVENOUS

## 2012-10-19 NOTE — Telephone Encounter (Signed)
H/o of diverticular bleed.  Passed BRBPR with stool a few moments ago.  Denies abdominal pain or dizziness.  Advised to go to ED for evaluation

## 2012-10-19 NOTE — ED Notes (Signed)
Patient with c/o rectal bleeding once about an hour ago.  Patient called her MD and was told to come to ED for further evaluation.  Patient has history of same in the past.  Patient also c/o slight weakness

## 2012-10-19 NOTE — H&P (Signed)
[facility] History and Physical  CAROLIE MCILRATH ZOX:[id number] DOB: 1929/01/14 DOA: 10/19/2012  Referring physician: ED PCP: Londell Moh, MD  Specialists: Jarold Motto: GI  Chief Complaint: BRBPR  HPI: [name] is a 79 y.o. female who presents to the ED with c/o 1 episode of BRBPR.  Described as painless.  Has had similar episodes in past (last in 2012) due to diverticulosis, followed by Dr. Jarold Motto.  Although she was asymptomatic other than the bleeding, she called his office and was advised by Dr. Arlyce Dice to come to ED.  In the ED she remains asymptomatic, no dizziness, cp, sob, abd pain, n/v or fever.  HGB was 12, vitals largely unremarkable.  Hospitalist asked to admit for overnight obs.  Review of Systems: patient admits to anxiety regarding her condition, 12 systems reviewed and otherwise negative.  Past Medical History  Diagnosis Date  . Diverticulosis of colon (without mention of hemorrhage)   . Esophageal reflux   . Hiatal hernia   . Depressive disorder, not elsewhere classified   . Functional diarrhea   . Type II or unspecified type diabetes mellitus with other specified manifestations, not stated as uncontrolled   . Anemia, unspecified   . Acute bronchitis   . Tachycardia, unspecified   . Other specified cardiac dysrhythmias   . Other chest pain   . Unspecified adverse effect of unspecified drug, medicinal and biological substance   . Rheumatoid arthritis   . Unspecified hypothyroidism   . Unspecified essential hypertension   . Other and unspecified hyperlipidemia   . Family history of malignant neoplasm of gastrointestinal tract   . Personal history of colonic polyps 07/17/1995    adenomatous polyps  . PONV (postoperative nausea and vomiting)    Past Surgical History  Procedure Date  . Partial hysterectomy   . Thyroidectomy, partial   . Cataract extraction     left  . Orif femur fracture 08/30/2012    Procedure: OPEN REDUCTION  INTERNAL FIXATION (ORIF) DISTAL FEMUR FRACTURE;  Surgeon: Shelda Pal, MD;  Location: [facility];  Service: Orthopedics;  Laterality: Right;  proximal femur   Social History:  reports that she has never smoked. She has never used smokeless tobacco. She reports that she drinks alcohol. She reports that she does not use illicit drugs.   No Known Allergies  Family History  Problem Relation Age of Onset  . Colon cancer Maternal Grandmother     Prior to Admission medications   Medication Sig Start Date End Date Taking? Authorizing Provider  calcium-vitamin D (OSCAL WITH D) 500-200 MG-UNIT per tablet Take 2 tablets by mouth daily.   Yes Historical Provider, MD  folic acid (FOLVITE) 1 MG tablet Take 1 mg by mouth daily.     Yes Historical Provider, MD  glimepiride (AMARYL) 1 MG tablet Take 0.5 mg by mouth daily.    Yes Historical Provider, MD  hydroxychloroquine (PLAQUENIL) 200 MG tablet Take 200 mg by mouth 2 (two) times daily.     Yes Historical Provider, MD  levothyroxine (SYNTHROID, LEVOTHROID) 137 MCG tablet Take 137 mcg by mouth every morning.    Yes Historical Provider, MD  metFORMIN (GLUCOPHAGE) 500 MG tablet Take 500 mg by mouth 2 (two) times daily.   Yes Historical Provider, MD  methotrexate (RHEUMATREX) 2.5 MG tablet Take 25 mg by mouth every Wednesday. Take 10 pills by mouth every Wednesday   Yes Historical Provider, MD  Multiple Vitamin (MULTIVITAMIN WITH MINERALS) TABS Take 1 tablet by mouth daily.  Yes Historical Provider, MD  Pitavastatin Calcium (LIVALO) 2 MG TABS Take 2 mg by mouth every Monday, Wednesday, and Friday at 6 PM.    Yes Historical Provider, MD  telmisartan (MICARDIS) 80 MG tablet Take 40 mg by mouth daily.    Yes Historical Provider, MD   Physical Exam: Filed Vitals:   10/19/12 2039 10/19/12 2135 10/19/12 2158  BP: 120/59  104/66  Pulse: 115  98  Temp: 98.2 F (36.8 C)    TempSrc: Oral    Resp: 20 18 19   SpO2: 99%  98%    General:  NAD, resting comfortably  in bed Eyes: PEERLA EOMI ENT: mucous membranes moist Neck: supple w/o JVD Cardiovascular: RRR w/o MRG Respiratory: CTA B Abdomen: soft, nt, nd, bs+ Skin: no rash nor lesion Musculoskeletal: MAE, full ROM all 4 extremities Psychiatric: normal tone and affect Neurologic: AAOx3, grossly non-focal  Labs on Admission:  Basic Metabolic Panel:  Lab 10/19/12 4540  NA 137  K 4.2  CL 102  CO2 24  GLUCOSE 148*  BUN 18  CREATININE 0.68  CALCIUM 9.1  MG --  PHOS --   Liver Function Tests:  Lab 10/19/12 2140  AST 16  ALT 14  ALKPHOS 118*  BILITOT 0.3  PROT 6.6  ALBUMIN 3.6   No results found for this basename: LIPASE:5,AMYLASE:5 in the last 168 hours No results found for this basename: AMMONIA:5 in the last 168 hours CBC:  Lab 10/19/12 2140  WBC 10.6*  NEUTROABS 8.6*  HGB 12.1  HCT 37.3  MCV 95.9  PLT 310   Cardiac Enzymes: No results found for this basename: CKTOTAL:5,CKMB:5,CKMBINDEX:5,TROPONINI:5 in the last 168 hours  BNP (last 3 results) No results found for this basename: PROBNP:3 in the last 8760 hours CBG:  Lab 10/19/12 2153  GLUCAP 136*    Radiological Exams on Admission: No results found.  EKG: Independently reviewed.  Assessment/Plan Active Problems:  DIABETES MELLITUS, TYPE II  DIVERTICULOSIS, COLON  Lower GI bleeding   1. BRBPR - LGIB likely diverticular bleed given history, patient isnt anemic at this point, no indication for transfusion, will keep NS running at 75 cc/hr IV, may need GI consult tomorrow, will put on clear liquid diet for now, recheck cbg in AM. 2. DM2 - continue home meds 3. HTN - continue home meds  No consults called.  Code Status: Full code (must indicate code status--if unknown or must be presumed, indicate so) Family Communication: no family in room (indicate person spoken with, if applicable, with phone number if by telephone) Disposition Plan: admit to obs (indicate anticipated LOS)  Time spent: 50  min  GARDNER, JARED M. [facility] Pager [phone]  If 7PM-7AM, please contact night-coverage [email or web address] Password [email or web address] 10/19/2012, 10:57 PM

## 2012-10-19 NOTE — ED Provider Notes (Signed)
History     CSN: [id number]  Arrival date & time 10/19/12  2028   First MD Initiated Contact with Patient 10/19/12 2103      Chief Complaint  Patient presents with  . Rectal Bleeding    (Consider location/radiation/quality/duration/timing/severity/associated sxs/prior treatment) HPI Pt with painless passage of bright red blood per rectum mixed with stool filling toilet. Has had similar episode in the past due to diverticulosis and is followed by Dr Jarold Motto. Called his office and was told to come to ED. Pt is currently asymptomatic. Denies dizziness, Cp, SOB, abd pain, N/V, fever.  Past Medical History  Diagnosis Date  . Diverticulosis of colon (without mention of hemorrhage)   . Esophageal reflux   . Hiatal hernia   . Depressive disorder, not elsewhere classified   . Functional diarrhea   . Type II or unspecified type diabetes mellitus with other specified manifestations, not stated as uncontrolled   . Anemia, unspecified   . Acute bronchitis   . Tachycardia, unspecified   . Other specified cardiac dysrhythmias   . Other chest pain   . Unspecified adverse effect of unspecified drug, medicinal and biological substance   . Rheumatoid arthritis   . Unspecified hypothyroidism   . Unspecified essential hypertension   . Other and unspecified hyperlipidemia   . Family history of malignant neoplasm of gastrointestinal tract   . Personal history of colonic polyps 07/17/1995    adenomatous polyps  . PONV (postoperative nausea and vomiting)     Past Surgical History  Procedure Date  . Partial hysterectomy   . Thyroidectomy, partial   . Cataract extraction     left  . Orif femur fracture 08/30/2012    Procedure: OPEN REDUCTION INTERNAL FIXATION (ORIF) DISTAL FEMUR FRACTURE;  Surgeon: Shelda Pal, MD;  Location: [facility];  Service: Orthopedics;  Laterality: Right;  proximal femur    Family History  Problem Relation Age of Onset  . Colon cancer Maternal Grandmother      History  Substance Use Topics  . Smoking status: Never Smoker   . Smokeless tobacco: Never Used  . Alcohol Use: Yes     Comment: one glass of wine per week    OB History    Grav Para Term Preterm Abortions TAB SAB Ect Mult Living                  Review of Systems  Constitutional: Negative for fever and chills.  Respiratory: Negative for chest tightness and shortness of breath.   Cardiovascular: Negative for chest pain, palpitations and leg swelling.  Gastrointestinal: Positive for diarrhea and blood in stool. Negative for nausea, vomiting, abdominal pain and constipation.  Genitourinary: Negative for dysuria.  Musculoskeletal: Negative for back pain.  Skin: Negative for pallor and rash.  Neurological: Negative for dizziness, syncope, weakness, light-headedness, numbness and headaches.  All other systems reviewed and are negative.    Allergies  Review of patient's allergies indicates no known allergies.  Home Medications   Current Outpatient Rx  Name  Route  Sig  Dispense  Refill  . CALCIUM CARBONATE-VITAMIN D 500-200 MG-UNIT PO TABS   Oral   Take 2 tablets by mouth daily.         Marland Kitchen FOLIC ACID 1 MG PO TABS   Oral   Take 1 mg by mouth daily.           Marland Kitchen GLIMEPIRIDE 1 MG PO TABS   Oral   Take 0.5 mg by mouth daily.          Marland Kitchen  HYDROXYCHLOROQUINE SULFATE 200 MG PO TABS   Oral   Take 200 mg by mouth 2 (two) times daily.           Marland Kitchen LEVOTHYROXINE SODIUM 137 MCG PO TABS   Oral   Take 137 mcg by mouth every morning.          Marland Kitchen METFORMIN HCL 500 MG PO TABS   Oral   Take 500 mg by mouth 2 (two) times daily.         Marland Kitchen METHOTREXATE (ANTI-RHEUMATIC) 2.5 MG PO TABS   Oral   Take 25 mg by mouth every Wednesday. Take 10 pills by mouth every Wednesday         . ADULT MULTIVITAMIN W/MINERALS CH   Oral   Take 1 tablet by mouth daily.         Marland Kitchen PITAVASTATIN CALCIUM 2 MG PO TABS   Oral   Take 2 mg by mouth every Monday, Wednesday, and Friday at 6  PM.          . TELMISARTAN 80 MG PO TABS   Oral   Take 40 mg by mouth daily.            BP 104/66  Pulse 98  Temp 98.2 F (36.8 C) (Oral)  Resp 19  SpO2 98%  Physical Exam  Nursing note and vitals reviewed. Constitutional: She is oriented to person, place, and time. She appears well-developed and well-nourished. No distress.  HENT:  Head: Normocephalic and atraumatic.  Mouth/Throat: Oropharynx is clear and moist.  Eyes: EOM are normal. Pupils are equal, round, and reactive to light.  Neck: Normal range of motion. Neck supple.  Cardiovascular: Normal rate and regular rhythm.   Pulmonary/Chest: Effort normal and breath sounds normal. No respiratory distress. She has no wheezes. She has no rales. She exhibits no tenderness.  Abdominal: Soft. Bowel sounds are normal. She exhibits no distension and no mass. There is no tenderness. There is no rebound and no guarding.  Genitourinary: Guaiac positive stool.       Dried blood around anus.   Musculoskeletal: Normal range of motion. She exhibits no edema and no tenderness.  Neurological: She is alert and oriented to person, place, and time.       Moves all ext without deficit, sensation intact.   Skin: Skin is warm and dry. No rash noted. No erythema.  Psychiatric:       anxious    ED Course  Procedures (including critical care time)  Labs Reviewed  COMPREHENSIVE METABOLIC PANEL - Abnormal; Notable for the following:    Glucose, Bld 148 (*)     Alkaline Phosphatase 118 (*)     GFR calc non Af Amer 79 (*)     All other components within normal limits  CBC WITH DIFFERENTIAL - Abnormal; Notable for the following:    WBC 10.6 (*)     RDW 16.2 (*)     Neutrophils Relative 81 (*)     Neutro Abs 8.6 (*)     Lymphocytes Relative 11 (*)     All other components within normal limits  GLUCOSE, CAPILLARY - Abnormal; Notable for the following:    Glucose-Capillary 136 (*)     All other components within normal limits  TYPE AND  SCREEN  PROTIME-INR  APTT   No results found.   1. GI bleed      Date: 10/19/2012  Rate: 105  Rhythm: sinus tachycardia  QRS Axis: normal  Intervals: normal  ST/T Wave abnormalities: normal  Conduction Disutrbances:none  Narrative Interpretation:   Old EKG Reviewed: unchanged    MDM   [facility] will see in ED and admit       Loren Racer, MD 10/19/12 2238

## 2012-10-20 DIAGNOSIS — E785 Hyperlipidemia, unspecified: Secondary | ICD-10-CM | POA: Diagnosis present

## 2012-10-20 DIAGNOSIS — E119 Type 2 diabetes mellitus without complications: Secondary | ICD-10-CM | POA: Diagnosis present

## 2012-10-20 DIAGNOSIS — E43 Unspecified severe protein-calorie malnutrition: Secondary | ICD-10-CM | POA: Diagnosis present

## 2012-10-20 DIAGNOSIS — K5730 Diverticulosis of large intestine without perforation or abscess without bleeding: Secondary | ICD-10-CM

## 2012-10-20 DIAGNOSIS — K219 Gastro-esophageal reflux disease without esophagitis: Secondary | ICD-10-CM | POA: Diagnosis present

## 2012-10-20 DIAGNOSIS — D649 Anemia, unspecified: Secondary | ICD-10-CM | POA: Diagnosis not present

## 2012-10-20 DIAGNOSIS — M069 Rheumatoid arthritis, unspecified: Secondary | ICD-10-CM | POA: Diagnosis present

## 2012-10-20 DIAGNOSIS — I10 Essential (primary) hypertension: Secondary | ICD-10-CM | POA: Diagnosis present

## 2012-10-20 DIAGNOSIS — S72009A Fracture of unspecified part of neck of unspecified femur, initial encounter for closed fracture: Secondary | ICD-10-CM

## 2012-10-20 DIAGNOSIS — F329 Major depressive disorder, single episode, unspecified: Secondary | ICD-10-CM | POA: Diagnosis present

## 2012-10-20 DIAGNOSIS — E1169 Type 2 diabetes mellitus with other specified complication: Secondary | ICD-10-CM | POA: Diagnosis not present

## 2012-10-20 DIAGNOSIS — E039 Hypothyroidism, unspecified: Secondary | ICD-10-CM | POA: Diagnosis present

## 2012-10-20 DIAGNOSIS — K5731 Diverticulosis of large intestine without perforation or abscess with bleeding: Secondary | ICD-10-CM | POA: Diagnosis present

## 2012-10-20 DIAGNOSIS — IMO0002 Reserved for concepts with insufficient information to code with codable children: Secondary | ICD-10-CM | POA: Diagnosis not present

## 2012-10-20 DIAGNOSIS — K922 Gastrointestinal hemorrhage, unspecified: Secondary | ICD-10-CM | POA: Diagnosis not present

## 2012-10-20 DIAGNOSIS — Z79899 Other long term (current) drug therapy: Secondary | ICD-10-CM | POA: Diagnosis not present

## 2012-10-20 LAB — CBC
HCT: 28.1 % — ABNORMAL LOW (ref 36.0–46.0)
MCH: 31.3 pg (ref 26.0–34.0)
MCHC: 32.7 g/dL (ref 30.0–36.0)
Platelets: 247 10*3/uL (ref 150–400)
RDW: 16.4 % — ABNORMAL HIGH (ref 11.5–15.5)

## 2012-10-20 LAB — GLUCOSE, CAPILLARY
Glucose-Capillary: 131 mg/dL — ABNORMAL HIGH (ref 70–99)
Glucose-Capillary: 86 mg/dL (ref 70–99)

## 2012-10-20 LAB — BASIC METABOLIC PANEL
Calcium: 8.3 mg/dL — ABNORMAL LOW (ref 8.4–10.5)
GFR calc Af Amer: >90 mL/min (ref >90)
GFR calc non Af Amer: 83 mL/min — ABNORMAL LOW (ref >90)
Glucose, Bld: 149 mg/dL — ABNORMAL HIGH (ref 70–99)
Potassium: 3.9 mEq/L (ref 3.5–5.1)
Sodium: 137 mEq/L (ref 135–145)

## 2012-10-20 NOTE — Progress Notes (Signed)
INITIAL NUTRITION ASSESSMENT  DOCUMENTATION CODES Per approved criteria  -Severe malnutrition in the context of chronic illness   INTERVENTION: 1.  Modify diet; per MD discretion to Regular goal.  Pt declines supplements at this time due to concern for CBGs.  NUTRITION DIAGNOSIS: Unintentional wt loss related to meal skipping as evidenced by pt report.   Monitor:  1.  Food/Beverage; intake sufficient to meet >/=90% estimated needs 2.  Wt/wt change; monitor trends.  Reason for Assessment: MST  79 y.o. female  Admitting Dx: BRBPR  ASSESSMENT: Pt admitted with hematochezia.  Pt with h/o of hip surgery last month, currently undergoing rehab.   Review of wt hx shows pt with significant wt decline over the past 5 years from 170 lbs to 125 lbs.   RD met with pt who states she lost 30 lbs unintentionally 3.5 years ago due to husband's death.  She states she has recently been able to maintain her wt at 138-140 lbs.  Wt on admission was 125 lbs.  She has lost 10 lbs (7.5% usual wt) in the past 1.5 months r/t to meal skipping. Dietary recall (prior to rehab) provided by pt: Breakfast: bran flakes with splenda, whole milk, OJ  Lunch:  Soup and sandwich Dinner: Meat with vegetables  Pt feels she eats well at baseline. She states she has not been able to eat well since starting rehab.  She attributes wt loss to busy schedule at rehab facility and decreased appetite.  She does have Glucerna at home and uses it as needed for poor appetite and intake.  Pt does not appear concerned about wt. Assessment interrupted by phone call.  RD to continue to follow with pt for diet advancement.  Nutrition Focused Physical Exam: Subcutaneous Fat:  Orbital Region: moderate Upper Arm Region: n/a Thoracic and Lumbar Region: n/a  Muscle:  Temple Region: mild Clavicle Bone Region: moderate Clavicle and Acromion Bone Region: n/a Scapular Bone Region: n/a Dorsal Hand: moderate- note pt with RA Patellar  Region: n/a Anterior Thigh Region: n/a Posterior Calf Region: n/a  Edema: wnl  Pt qualifies for severe malnutrition of chronic illness based on 7.4% wt loss in 1 month with intake <75% of estimated needs.  Height: Ht Readings from Last 1 Encounters:  10/20/12 5\' 6"  (1.676 m)    Weight: Wt Readings from Last 1 Encounters:  10/20/12 125 lb (56.7 kg)    Ideal Body Weight: 130 lbs  % Ideal Body Weight: 96%  Wt Readings from Last 10 Encounters:  10/20/12 125 lb (56.7 kg)  08/28/12 136 lb (61.689 kg)  08/28/12 136 lb (61.689 kg)  06/26/11 134 lb 6.4 oz (60.963 kg)  03/31/09 158 lb (71.668 kg)  11/03/08 171 lb (77.565 kg)  08/23/08 171 lb (77.565 kg)  03/11/08 167 lb (75.751 kg)  04/14/07 172 lb (78.019 kg)  02/20/07 176 lb (79.833 kg)    Usual Body Weight: 160-170 lbs prior to 2012  % Usual Body Weight: 78%  BMI:  Body mass index is 20.18 kg/(m^2).  Estimated Nutritional Needs: Kcal: 1590-1820 Protein: 68-79g Fluid: >1.7 L/day  Skin: intact  Diet Order: Clear Liquid  EDUCATION NEEDS: -Education needs addressed   Intake/Output Summary (Last 24 hours) at 10/20/12 1423 Last data filed at 10/20/12 0500  Gross per 24 hour  Intake  352.5 ml  Output      0 ml  Net  352.5 ml    Last BM: 1/27  Labs:   Lab 10/20/12 0459 10/19/12 2140  NA  137 137  K 3.9 4.2  CL 105 102  CO2 23 24  BUN 16 18  CREATININE 0.59 0.68  CALCIUM 8.3* 9.1  MG -- --  PHOS -- --  GLUCOSE 149* 148*    CBG (last 3)   Basename 10/20/12 1148 10/20/12 0741 10/19/12 2153  GLUCAP 86 131* 136*    Scheduled Meds:   . atorvastatin  10 mg Oral q1800  . calcium-vitamin D  2 tablet Oral Daily  . folic acid  1 mg Oral Daily  . glimepiride  0.5 mg Oral QAC breakfast  . hydroxychloroquine  200 mg Oral BID  . irbesartan  300 mg Oral Daily  . levothyroxine  137 mcg Oral QAC breakfast  . metFORMIN  500 mg Oral BID WC  . methotrexate  25 mg Oral Weekly  . multivitamin with minerals  1  tablet Oral Daily  . sodium chloride  3 mL Intravenous Q12H    Continuous Infusions:   . sodium chloride 75 mL/hr at 10/20/12 1610    Past Medical History  Diagnosis Date  . Diverticulosis of colon (without mention of hemorrhage)   . Esophageal reflux   . Hiatal hernia   . Depressive disorder, not elsewhere classified   . Functional diarrhea   . Type II or unspecified type diabetes mellitus with other specified manifestations, not stated as uncontrolled   . Anemia, unspecified   . Acute bronchitis   . Tachycardia, unspecified   . Other specified cardiac dysrhythmias   . Other chest pain   . Unspecified adverse effect of unspecified drug, medicinal and biological substance   . Rheumatoid arthritis   . Unspecified hypothyroidism   . Unspecified essential hypertension   . Other and unspecified hyperlipidemia   . Family history of malignant neoplasm of gastrointestinal tract   . Personal history of colonic polyps 07/17/1995    adenomatous polyps  . PONV (postoperative nausea and vomiting)     Past Surgical History  Procedure Date  . Partial hysterectomy   . Thyroidectomy, partial   . Cataract extraction     left  . Orif femur fracture 08/30/2012    Procedure: OPEN REDUCTION INTERNAL FIXATION (ORIF) DISTAL FEMUR FRACTURE;  Surgeon: Shelda Pal, MD;  Location: [facility];  Service: Orthopedics;  Laterality: Right;  proximal femur    Loyce Dys, MS RD LDN Clinical Inpatient Dietitian Pager: [phone] Weekend/After hours pager: [phone]

## 2012-10-20 NOTE — Evaluation (Signed)
Physical Therapy Evaluation Patient Details Name: [name] MRN: [id number] DOB: [birth year] Today's Date: 10/20/2012 Time: 1478-2956 PT Time Calculation (min): 22 min  PT Assessment / Plan / Recommendation Clinical Impression  Pt admitted for lower GI bleed and hx of recent R femur ORIF in December.  Pt reports she returned home last week and had started having HHPT just prior to admission.  Pt would benefit from acute PT services in order to improve independence with transfers and ambulation and continue mobilizing as well as strengthening R LE to prepare for d/c home.  Pt denied dizziness during ambulation from bathroom to recliner.    PT Assessment  Patient needs continued PT services    Follow Up Recommendations  Home health PT;Supervision for mobility/OOB    Does the patient have the potential to tolerate intense rehabilitation      Barriers to Discharge        Equipment Recommendations  None recommended by PT    Recommendations for Other Services     Frequency Min 3X/week    Precautions / Restrictions Precautions Precautions: Fall   Pertinent Vitals/Pain N/a, denies dizziness with mobility      Mobility  Bed Mobility Bed Mobility: Not assessed Transfers Transfers: Sit to Stand;Stand to Sit Sit to Stand: 4: Min guard;With upper extremity assist;From chair/3-in-1 Stand to Sit: 4: Min guard;With upper extremity assist;To chair/3-in-1 Details for Transfer Assistance: pt on [facility] over toilet upon entering with bloody BM (GI MD in to see), pt reports "you better hold me while I get my bottoms up", pt denies dizziness Ambulation/Gait Ambulation/Gait Assistance: 4: Min guard Ambulation Distance (Feet): 8 Feet Assistive device: Rolling walker Ambulation/Gait Assistance Details: pt would only ambulate to recliner, reports WBAT now after f/u visit with Dr. Charlann Boxer last week Gait Pattern: Step-to pattern;Decreased step length - right;Decreased stance time - right Gait  velocity: decreased    Shoulder Instructions     Exercises General Exercises - Lower Extremity Long Arc Quad: AROM;Both;Strengthening;15 reps;Seated Straight Leg Raises: AROM;Strengthening;Both;10 reps Hip Flexion/Marching: AROM;Strengthening;Both;15 reps;Seated Other Exercises Other Exercises: ham curls standing with RW x10 bilaterally   PT Diagnosis: Difficulty walking  PT Problem List: Decreased strength;Decreased activity tolerance;Decreased mobility PT Treatment Interventions: DME instruction;Gait training;Functional mobility training;Therapeutic activities;Patient/family education;Therapeutic exercise;Stair training   PT Goals Acute Rehab PT Goals PT Goal Formulation: With patient Time For Goal Achievement: 10/27/12 Potential to Achieve Goals: Good Pt will go Supine/Side to Sit: with modified independence PT Goal: Supine/Side to Sit - Progress: Goal set today Pt will go Sit to Stand: with modified independence PT Goal: Sit to Stand - Progress: Goal set today Pt will go Stand to Sit: with modified independence PT Goal: Stand to Sit - Progress: Goal set today Pt will Ambulate: 51 - 150 feet;with modified independence;with least restrictive assistive device PT Goal: Ambulate - Progress: Goal set today Pt will Perform Home Exercise Program: with supervision, verbal cues required/provided PT Goal: Perform Home Exercise Program - Progress: Goal set today  Visit Information  Last PT Received On: 10/20/12 Assistance Needed: +1    Subjective Data  Subjective: I was seen once by PT at home and then I came here.   Prior Functioning  Home Living Lives With: Alone Available Help at Discharge: Home health Type of Home: House Home Access: Stairs to enter [employer] of Steps: 1 Entrance Stairs-Rails: None Home Layout: One level Home Adaptive Equipment: Walker - rolling;Wheelchair - manual Prior Function Level of Independence: Independent with assistive  device(s) Communication  Communication: No difficulties    Cognition  Overall Cognitive Status: Appears within functional limits for tasks assessed/performed Arousal/Alertness: Awake/alert Orientation Level: Appears intact for tasks assessed Behavior During Session: [facility] for tasks performed    Extremity/Trunk Assessment Right Lower Extremity Assessment RLE ROM/Strength/Tone: Deficits RLE ROM/Strength/Tone Deficits: grossly at least 3+/5 throughout per functional observation Left Lower Extremity Assessment LLE ROM/Strength/Tone: [facility] for tasks assessed   Balance    End of Session PT - End of Session Activity Tolerance: Patient tolerated treatment well Patient left: in chair;with call bell/phone within reach Nurse Communication: Other (comment) (nsg tech aware pt in chair and wishes for bed changed)  GP     [name],[name] 10/20/2012, 1:57 PM  [name], PT, DPT 10/20/2012 Pager: [phone]

## 2012-10-20 NOTE — Progress Notes (Signed)
   CARE MANAGEMENT NOTE 10/20/2012  Patient:  [name], [name]   Account Number:  [id number]  Date Initiated:  10/20/2012  Documentation initiated by:  Jiles Crocker  Subjective/Objective Assessment:   ADMITTED WITH GIB     Action/Plan:   PCP: Londell Moh, MD  Specialists: Jarold Motto: GI  RECENTLY ADMITTED TO SNF/ [facility]-   Anticipated DC Date:  10/22/2012   Anticipated DC Plan:  HOME W HOME HEALTH SERVICES      DC Planning Services  CM consult          Status of service:  In process, will continue to follow Medicare Important Message given?  NA - LOS <3 / Initial given by admissions (If response is "NO", the following Medicare IM given date fields will be blank)  Per UR Regulation:  Reviewed for med. necessity/level of care/duration of stay  Comments:  10/20/2012- B Rhonda Fikes RN,BSN,MHA

## 2012-10-20 NOTE — Progress Notes (Signed)
[facility] PROGRESS NOTE  [name] ZOX:[id number] DOB: 08-10-1929 DOA: 10/19/2012 PCP: Londell Moh, MD  Assessment/Plan: 1. BRBPR - LGIB likely diverticular bleed given history, follow CBC (transfuse once <8),  will keep NS running at 75 cc/hr IV, consult GI, will put on clear liquid diet for now. 2. DM2 - continue home meds 3. HTN - continue home meds 4. Recent hip surgery- has full weight bearing permission by ortho per patient  Code Status: full Family Communication:  Disposition Plan:    Consultants:  GI  Procedures:  Antibiotics:    HPI/Subjective: No abd pain +blood in stool  Objective: Filed Vitals:   10/20/12 0010 10/20/12 0101 10/20/12 0140 10/20/12 0528  BP:  129/38 117/57 119/58  Pulse: 96 98 97 92  Temp:  98.2 F (36.8 C) 97.6 F (36.4 C) 97.8 F (36.6 C)  TempSrc:  Oral Oral Oral  Resp: 16 22 19 16   Height:   5\' 6"  (1.676 m)   Weight:   56.7 kg (125 lb)   SpO2: 99% 100% 100% 99%    Intake/Output Summary (Last 24 hours) at 10/20/12 1017 Last data filed at 10/20/12 0500  Gross per 24 hour  Intake  352.5 ml  Output      0 ml  Net  352.5 ml   Filed Weights   10/20/12 0140  Weight: 56.7 kg (125 lb)    Exam:   General:  A+Ox3, NAD  Cardiovascular: rrr  Respiratory: clear  Abdomen: no tenderness, +stool with blood mixed  Data Reviewed: Basic Metabolic Panel:  Lab 10/20/12 8119 10/19/12 2140  NA 137 137  K 3.9 4.2  CL 105 102  CO2 23 24  GLUCOSE 149* 148*  BUN 16 18  CREATININE 0.59 0.68  CALCIUM 8.3* 9.1  MG -- --  PHOS -- --   Liver Function Tests:  Lab 10/19/12 2140  AST 16  ALT 14  ALKPHOS 118*  BILITOT 0.3  PROT 6.6  ALBUMIN 3.6   No results found for this basename: LIPASE:5,AMYLASE:5 in the last 168 hours No results found for this basename: AMMONIA:5 in the last 168 hours CBC:  Lab 10/20/12 0459 10/19/12 2140  WBC 7.4 10.6*  NEUTROABS -- 8.6*  HGB 10.0* 12.1  HCT 30.6* 37.3    MCV 95.6 95.9  PLT 247 310   Cardiac Enzymes: No results found for this basename: CKTOTAL:5,CKMB:5,CKMBINDEX:5,TROPONINI:5 in the last 168 hours BNP (last 3 results) No results found for this basename: PROBNP:3 in the last 8760 hours CBG:  Lab 10/20/12 0741 10/19/12 2153  GLUCAP 131* 136*    No results found for this or any previous visit (from the past 240 hour(s)).   Studies: No results found.  Scheduled Meds:   . atorvastatin  10 mg Oral q1800  . calcium-vitamin D  2 tablet Oral Daily  . folic acid  1 mg Oral Daily  . glimepiride  0.5 mg Oral QAC breakfast  . hydroxychloroquine  200 mg Oral BID  . irbesartan  300 mg Oral Daily  . levothyroxine  137 mcg Oral QAC breakfast  . metFORMIN  500 mg Oral BID WC  . methotrexate  25 mg Oral Weekly  . multivitamin with minerals  1 tablet Oral Daily  . sodium chloride  3 mL Intravenous Q12H   Continuous Infusions:   . sodium chloride 75 mL/hr at 10/20/12 1478    Active Problems:  DIABETES MELLITUS, TYPE II  DIVERTICULOSIS, COLON  Lower GI bleeding  Time spent: 35    [facility], Tiffany Talarico  [facility] Pager [phone]. If 8PM-8AM, please contact night-coverage at [email or web address], password [facility] 10/20/2012, 10:17 AM  LOS: 1 day

## 2012-10-20 NOTE — Consult Note (Signed)
[facility] Consultation  Referring Provider:   [facility] Primary Care Physician:  Londell Moh, MD Primary Gastroenterologist:   Sheryn Bison, MD    Reason for Consultation:    Hematochezia      HPI: [name] is a 79 y.o. female with RA on Plaquenil and Methotrexate. She is known to Dr. Jarold Motto for a history of GERD, adenomatous colon polyps and a history of diverticular hemorrhages. She was hospitalized September 2012 with deverticular bleed requiring transfusion September 2012.   Yesterday afternoon patient passed soft stool with dark red blood. After passage of another bloody stool patient called out on-call MD who advised ED. Patient was admitted and arrived to her room around 1am. Since then she has had 3 more stools with dark red blood. No abdominal pain. No nausea. She isn't on anticoagulants. No dizziness or shortness of breath.    Patient had hip surgery last month. She has been residing at [facility]. She admits to 10 pound weight loss. Patient would be at therapy during meals so she frequently skipped meals.    Past Medical History  Diagnosis Date  . Diverticulosis of colon (without mention of hemorrhage)   . Esophageal reflux   . Hiatal hernia   . Depressive disorder, not elsewhere classified   . Functional diarrhea   . Type II or unspecified type diabetes mellitus with other specified manifestations, not stated as uncontrolled   . Anemia, unspecified   . Acute bronchitis   . Tachycardia, unspecified   . Other specified cardiac dysrhythmias   . Unspecified adverse effect of unspecified drug, medicinal and biological substance   . Rheumatoid arthritis   . Unspecified hypothyroidism   . Unspecified essential hypertension   . Other and unspecified hyperlipidemia   . Family history of malignant neoplasm of gastrointestinal tract   . Personal history of colonic polyps 07/17/1995    adenomatous polyps  . PONV (postoperative  nausea and vomiting)     Past Surgical History  Procedure Date  . Partial hysterectomy   . Thyroidectomy, partial   . Cataract extraction     left  . Orif femur fracture 08/30/2012    Procedure: OPEN REDUCTION INTERNAL FIXATION (ORIF) DISTAL FEMUR FRACTURE;  Surgeon: Shelda Pal, MD;  Location: [facility]S;  Service: Orthopedics;  Laterality: Right;  proximal femur    Family History  Problem Relation Age of Onset  . Colon cancer Maternal Grandmother     History  Substance Use Topics  . Smoking status: Never Smoker   . Smokeless tobacco: Never Used  . Alcohol Use: Yes     Comment: one glass of wine per week    Prior to Admission medications   Medication Sig Start Date End Date Taking? Authorizing Provider  calcium-vitamin D (OSCAL WITH D) 500-200 MG-UNIT per tablet Take 2 tablets by mouth daily.   Yes Historical Provider, MD  folic acid (FOLVITE) 1 MG tablet Take 1 mg by mouth daily.     Yes Historical Provider, MD  glimepiride (AMARYL) 1 MG tablet Take 0.5 mg by mouth daily.    Yes Historical Provider, MD  hydroxychloroquine (PLAQUENIL) 200 MG tablet Take 200 mg by mouth 2 (two) times daily.     Yes Historical Provider, MD  levothyroxine (SYNTHROID, LEVOTHROID) 137 MCG tablet Take 137 mcg by mouth every morning.    Yes Historical Provider, MD  metFORMIN (GLUCOPHAGE) 500 MG tablet Take 500 mg by mouth 2 (two) times daily.   Yes Historical Provider, MD  methotrexate (RHEUMATREX) 2.5 MG tablet Take 25 mg by mouth every Wednesday. Take 10 pills by mouth every Wednesday   Yes Historical Provider, MD  Multiple Vitamin (MULTIVITAMIN WITH MINERALS) TABS Take 1 tablet by mouth daily.   Yes Historical Provider, MD  Pitavastatin Calcium (LIVALO) 2 MG TABS Take 2 mg by mouth every Monday, Wednesday, and Friday at 6 PM.    Yes Historical Provider, MD  telmisartan (MICARDIS) 80 MG tablet Take 40 mg by mouth daily.    Yes Historical Provider, MD    Current Facility-Administered Medications    Medication Dose Route Frequency Provider Last Rate Last Dose  . 0.9 %  sodium chloride infusion   Intravenous Continuous Hillary Bow, DO 75 mL/hr at 10/20/12 2841    . atorvastatin (LIPITOR) tablet 10 mg  10 mg Oral q1800 Hillary Bow, DO      . calcium-vitamin D (OSCAL WITH D) 500-200 MG-UNIT per tablet 2 tablet  2 tablet Oral Daily Hillary Bow, DO   2 tablet at 10/20/12 1010  . folic acid (FOLVITE) tablet 1 mg  1 mg Oral Daily Hillary Bow, DO   1 mg at 10/20/12 1011  . glimepiride (AMARYL) tablet 0.5 mg  0.5 mg Oral QAC breakfast Hillary Bow, DO   0.5 mg at 10/20/12 0840  . hydroxychloroquine (PLAQUENIL) tablet 200 mg  200 mg Oral BID Hillary Bow, DO   200 mg at 10/20/12 1010  . irbesartan (AVAPRO) tablet 300 mg  300 mg Oral Daily Hillary Bow, DO      . levothyroxine (SYNTHROID, LEVOTHROID) tablet 137 mcg  137 mcg Oral QAC breakfast Hillary Bow, DO   137 mcg at 10/20/12 0840  . metFORMIN (GLUCOPHAGE) tablet 500 mg  500 mg Oral BID WC Hillary Bow, DO   500 mg at 10/20/12 0840  . methotrexate (RHEUMATREX) tablet 25 mg  25 mg Oral Weekly Loren Racer, MD      . multivitamin with minerals tablet 1 tablet  1 tablet Oral Daily Hillary Bow, DO   1 tablet at 10/20/12 1010  . sodium chloride 0.9 % injection 3 mL  3 mL Intravenous Q12H Hillary Bow, DO   3 mL at 10/20/12 1011    Allergies as of 10/19/2012  . (No Known Allergies)     Review of Systems:    Approximate 10 pound weight loss while at [facility].All other systems reviewed and negative except where noted in HPI.  PHYSICAL EXAM: Vital signs in last 24 hours: Temp:  [97.6 F (36.4 C)-98.2 F (36.8 C)] 97.8 F (36.6 C) (01/27 0528) Pulse Rate:  [88-115] 92  (01/27 0528) Resp:  [15-26] 16  (01/27 0528) BP: (104-129)/(38-66) 119/58 mmHg (01/27 0528) SpO2:  [96 %-100 %] 99 % (01/27 0528) Weight:  [125 lb (56.7 kg)] 125 lb (56.7 kg) (01/27 0140) Last BM Date: 10/20/12 General:    Pleasant  white female in NAD Head:  Normocephalic and atraumatic. Eyes:   No icterus.   Conjunctiva pink. Ears:  Normal auditory acuity. Neck:  Supple; no masses felt Lungs:  Respirations even and unlabored. Lungs clear to auscultation bilaterally.   No wheezes, crackles, or rhonchi.  Heart:  Regular rate and rhythm. Abdomen:  Soft, nondistended, nontender. Normal bowel sounds. No appreciable masses or hepatomegaly.  Rectal:  Old skin hemorrhoidal tags. Flecks of blood on gloved finger.   Msk:  Symmetrical without gross deformities.  Extremities:  Without edema. Neurologic:  Alert  and  oriented x4;  grossly normal neurologically. Skin:  Intact without significant lesions or rashes. Cervical Nodes:  No significant cervical adenopathy. Psych:  Alert and cooperative. Normal affect.  LAB RESULTS:  Basename 10/20/12 0459 10/19/12 2140  WBC 7.4 10.6*  HGB 10.0* 12.1  HCT 30.6* 37.3  PLT 247 310   BMET  Basename 10/20/12 0459 10/19/12 2140  NA 137 137  K 3.9 4.2  CL 105 102  CO2 23 24  GLUCOSE 149* 148*  BUN 16 18  CREATININE 0.59 0.68  CALCIUM 8.3* 9.1   LFT  Basename 10/19/12 2140  PROT 6.6  ALBUMIN 3.6  AST 16  ALT 14  ALKPHOS 118*  BILITOT 0.3  BILIDIR --  IBILI --   PT/INR  Basename 10/19/12 2140  LABPROT 12.3  INR 0.92    PREVIOUS ENDOSCOPIES: colonoscopy 2009 ( polyp surveillance). Multiple complex tics in right and left colon, proctitis. Ascending colon polyp (not retrieved)  IMPRESSION / PLAN: 1. Painless hematochezia, stool with dark red blood. Suspect recurrent diverticular hemorrhage. Hgb down 2 grams from baseline (checked around 5 am).   Continue supportive care with IVF, clear liquid, serial h&h.  Further recommendations pending clinical course.   Her last colonoscopy was 5 years ago revealing pan-diverticulosis. Given advanced age she will hopefully not require another one.  Recheck cbc. She just had another BM with blood.   2. RA, on  plaquenil and Methotrexate. .  3. DM. She is inquiring about diet. Continue clears for now. CBG at noon was 86.    4. Hypothyroidism., on replacement hormone.   Thanks   LOS: 1 day   Willette Cluster  10/20/2012, 12:11 PM

## 2012-10-20 NOTE — Consult Note (Signed)
Patient seen, examined, and I agree with the above documentation, including the assessment and plan. Most likely recurrent diverticular hemorrhage.  Stable at present.  As before these episodes of bleeding have been painless. Supportive care, follow Hgb, transfuse if necessary

## 2012-10-21 DIAGNOSIS — D649 Anemia, unspecified: Secondary | ICD-10-CM | POA: Diagnosis not present

## 2012-10-21 DIAGNOSIS — K922 Gastrointestinal hemorrhage, unspecified: Secondary | ICD-10-CM | POA: Diagnosis not present

## 2012-10-21 DIAGNOSIS — K5730 Diverticulosis of large intestine without perforation or abscess without bleeding: Secondary | ICD-10-CM | POA: Diagnosis not present

## 2012-10-21 DIAGNOSIS — S72009A Fracture of unspecified part of neck of unspecified femur, initial encounter for closed fracture: Secondary | ICD-10-CM | POA: Diagnosis not present

## 2012-10-21 LAB — BASIC METABOLIC PANEL
CO2: 24 mEq/L (ref 19–32)
Calcium: 8.3 mg/dL — ABNORMAL LOW (ref 8.4–10.5)
Chloride: 108 mEq/L (ref 96–112)
Creatinine, Ser: 0.6 mg/dL (ref 0.50–1.10)
Glucose, Bld: 105 mg/dL — ABNORMAL HIGH (ref 70–99)
Sodium: 140 mEq/L (ref 135–145)

## 2012-10-21 LAB — GLUCOSE, CAPILLARY

## 2012-10-21 LAB — CBC
HCT: 27.3 % — ABNORMAL LOW (ref 36.0–46.0)
MCH: 31.5 pg (ref 26.0–34.0)
MCV: 95.5 fL (ref 78.0–100.0)
Platelets: 233 10*3/uL (ref 150–400)
RBC: 2.86 MIL/uL — ABNORMAL LOW (ref 3.87–5.11)

## 2012-10-21 MED ORDER — ACETAMINOPHEN 325 MG PO TABS
650.0000 mg | ORAL_TABLET | Freq: Four times a day (QID) | ORAL | Status: DC | PRN
  Administered 2012-10-21: 650 mg via ORAL
  Filled 2012-10-21: qty 2

## 2012-10-21 NOTE — Progress Notes (Addendum)
[facility] PROGRESS NOTE  [name]:[id number] DOB: Apr 20, 1929 DOA: 10/19/2012 PCP: Londell Moh, MD  Assessment/Plan: 1. BRBPR - LGIB likely diverticular bleed given history, follow CBC (transfuse once <8),  Appreciate GI following, advance diet, Hgb stable- home soon 2. DM2 - continue home meds 3. HTN - continue home meds 4. Recent hip surgery- has full weight bearing permission by ortho per patient- PT seen and recs for home health   Severe malnutrition in the context of chronic illness   Code Status: full Family Communication:  Disposition Plan:    Consultants:  GI  Procedures:  Antibiotics:    HPI/Subjective: Eating well, wants advancement of diet Asking for tylenol for pain  Objective: Filed Vitals:   10/20/12 0528 10/20/12 1615 10/20/12 2155 10/21/12 0548  BP: 119/58 112/58 118/50 131/67  Pulse: 92 104 84 92  Temp: 97.8 F (36.6 C) 97.9 F (36.6 C) 97.8 F (36.6 C) 98.2 F (36.8 C)  TempSrc: Oral Oral Oral Oral  Resp: 16 18 18 16   Height:      Weight:      SpO2: 99% 100% 100% 99%    Intake/Output Summary (Last 24 hours) at 10/21/12 1009 Last data filed at 10/21/12 0700  Gross per 24 hour  Intake   2310 ml  Output      0 ml  Net   2310 ml   Filed Weights   10/20/12 0140  Weight: 56.7 kg (125 lb)    Exam:   General:  A+Ox3, NAD  Cardiovascular: rrr  Respiratory: clear Abdomen: +BS, soft, NT   Data Reviewed: Basic Metabolic Panel:  Lab 10/21/12 8119 10/20/12 0459 10/19/12 2140  NA 140 137 137  K 3.6 3.9 4.2  CL 108 105 102  CO2 24 23 24   GLUCOSE 105* 149* 148*  BUN 11 16 18   CREATININE 0.60 0.59 0.68  CALCIUM 8.3* 8.3* 9.1  MG -- -- --  PHOS -- -- --   Liver Function Tests:  Lab 10/19/12 2140  AST 16  ALT 14  ALKPHOS 118*  BILITOT 0.3  PROT 6.6  ALBUMIN 3.6   No results found for this basename: LIPASE:5,AMYLASE:5 in the last 168 hours No results found for this basename: AMMONIA:5 in the  last 168 hours CBC:  Lab 10/21/12 0457 10/20/12 1430 10/20/12 0459 10/19/12 2140  WBC 6.6 8.3 7.4 10.6*  NEUTROABS -- -- -- 8.6*  HGB 9.0* 9.5* 10.0* 12.1  HCT 27.3* 28.1* 30.6* 37.3  MCV 95.5 95.3 95.6 95.9  PLT 233 276 247 310   Cardiac Enzymes: No results found for this basename: CKTOTAL:5,CKMB:5,CKMBINDEX:5,TROPONINI:5 in the last 168 hours BNP (last 3 results) No results found for this basename: PROBNP:3 in the last 8760 hours CBG:  Lab 10/21/12 0750 10/20/12 2151 10/20/12 1726 10/20/12 1148 10/20/12 0741  GLUCAP 101* 104* 94 86 131*    No results found for this or any previous visit (from the past 240 hour(s)).   Studies: No results found.  Scheduled Meds:    . calcium-vitamin D  2 tablet Oral Daily  . folic acid  1 mg Oral Daily  . glimepiride  0.5 mg Oral QAC breakfast  . hydroxychloroquine  200 mg Oral BID  . irbesartan  300 mg Oral Daily  . levothyroxine  137 mcg Oral QAC breakfast  . metFORMIN  500 mg Oral BID WC  . methotrexate  25 mg Oral Weekly  . multivitamin with minerals  1 tablet Oral Daily  . sodium chloride  3 mL Intravenous Q12H   Continuous Infusions:   Active Problems:  DIABETES MELLITUS, TYPE II  DIVERTICULOSIS, COLON  Lower GI bleeding    Time spent: 35    [facility], Danaysha Kirn  [facility] Pager [phone]. If 8PM-8AM, please contact night-coverage at [email or web address], password [facility] 10/21/2012, 10:09 AM  LOS: 2 days

## 2012-10-21 NOTE — Progress Notes (Signed)
Patient seen, examined, and I agree with the above documentation, including the assessment and plan. No further bleeding with fairly stable hemoglobin. Agree with advancing diet and watchful waiting/supportive care for presumed diverticular hemorrhage

## 2012-10-21 NOTE — Clinical Documentation Improvement (Signed)
MALNUTRITION DOCUMENTATION CLARIFICATION  THIS DOCUMENT IS NOT A PERMANENT PART OF THE MEDICAL RECORD  TO RESPOND TO THE THIS QUERY, FOLLOW THE INSTRUCTIONS BELOW:  1. If needed, update documentation for the patient's encounter via the notes activity.  2. Access this query again and click edit on the In [employer].  3. After updating, or not, click F2 to complete all highlighted (required) fields concerning your review. Select "additional documentation in the medical record" OR "no additional documentation provided".  4. Click Sign note button.  5. The deficiency will fall out of your In Basket *Please let us know if you are not able to complete this workflow by phone or e-mail (listed below).  Please update your documentation within the medical record to reflect your response to this query.                                                                                        10/21/12   Dear Dr.[facility],  In a better effort to capture your patient's severity of illness, reflect appropriate length of stay and utilization of resources, a review of the patient medical record has revealed the following indicators.    Based on your clinical judgment, please clarify and document in a progress note and/or discharge summary the clinical condition associated with the following supporting information:  In responding to this query please exercise your independent judgment.  The fact that a query is asked, does not imply that any particular answer is desired or expected. 10/20/12 Nutr eval noted w/ nutr documentation criteria for "-Severe malnutrition in the context of chronic illness." For accurate Dx specificity & severity please help validate nutr documentation for clinical cond being eval'd, mon'd & tx'd. Thank You  Possible Clinical Conditions? . Mild Malnutrition  . Moderate Malnutrition . Severe Malnutrition   . Protein Calorie Malnutrition . Severe Protein Calorie  Malnutrition  . Emaciation  . Cachexia   Other Condition (please specify) Cannot Clinically Determine   Supporting Information: Risk Factors: See Nutrition Eval 10/20/12  Signs & Symptoms: See Nutrition Eval 10/20/12  Diagnostics: See Nutrition Eval 10/20/12  Treatments: See Nutrition Eval 10/20/12  -Medications:  Nutrition Consult: See Nutrition Eval 10/20/12   You may use possible, probable, or suspect with inpatient documentation. possible, probable, suspected diagnoses MUST be documented at the time of discharge  Reviewed: additional documentation in the medical record  Thank You,  Toribio Harbour, RN, BSN, CCDS Certified Clinical Documentation Specialist Pager: [phone]  Health Information Management [place]

## 2012-10-21 NOTE — Progress Notes (Signed)
[facility] Progress Note  SUBJECTIVE: no further bleeding. Hungry. Wants to go home  OBJECTIVE:  Vital signs in last 24 hours: Temp:  [97.8 F (36.6 C)-98.2 F (36.8 C)] 98.2 F (36.8 C) (01/28 0548) Pulse Rate:  [84-104] 92  (01/28 0548) Resp:  [16-18] 16  (01/28 0548) BP: (112-131)/(50-67) 131/67 mmHg (01/28 0548) SpO2:  [99 %-100 %] 99 % (01/28 0548) Last BM Date: 10/21/12 General:    Pleasant white female in NAD Abdomen:  Soft, nontender and nondistended. Normal bowel sounds. Extremities:  Without edema. Neurologic:  Alert and oriented,  grossly normal neurologically. Psych:  Cooperative. Normal mood and affect.     Lab Results:  Basename 10/21/12 0457 10/20/12 1430 10/20/12 0459  WBC 6.6 8.3 7.4  HGB 9.0* 9.5* 10.0*  HCT 27.3* 28.1* 30.6*  PLT 233 276 247   BMET  Basename 10/21/12 0457 10/20/12 0459 10/19/12 2140  NA 140 137 137  K 3.6 3.9 4.2  CL 108 105 102  CO2 24 23 24   GLUCOSE 105* 149* 148*  BUN 11 16 18   CREATININE 0.60 0.59 0.68  CALCIUM 8.3* 8.3* 9.1   LFT  Basename 10/19/12 2140  PROT 6.6  ALBUMIN 3.6  AST 16  ALT 14  ALKPHOS 118*  BILITOT 0.3  BILIDIR --  IBILI --   PT/INR  Basename 10/19/12 2140  LABPROT 12.3  INR 0.92     ASSESSMENT / PLAN:   Painless hematochezia, stool with dark red blood. Suspect recurrent diverticular hemorrhage. Hgb stable overnight (9.5 >> 9.0). No bleeding since I saw her yesterday. Will advance diet. Hopefully home soon. .    LOS: 2 days   Willette Cluster  10/21/2012, 9:43 AM

## 2012-10-22 DIAGNOSIS — K5730 Diverticulosis of large intestine without perforation or abscess without bleeding: Secondary | ICD-10-CM | POA: Diagnosis not present

## 2012-10-22 DIAGNOSIS — K922 Gastrointestinal hemorrhage, unspecified: Secondary | ICD-10-CM | POA: Diagnosis not present

## 2012-10-22 DIAGNOSIS — D649 Anemia, unspecified: Secondary | ICD-10-CM | POA: Diagnosis not present

## 2012-10-22 LAB — BASIC METABOLIC PANEL
CO2: 26 mEq/L (ref 19–32)
Calcium: 8.6 mg/dL (ref 8.4–10.5)
Chloride: 103 mEq/L (ref 96–112)
Creatinine, Ser: 0.67 mg/dL (ref 0.50–1.10)
Glucose, Bld: 113 mg/dL — ABNORMAL HIGH (ref 70–99)
Sodium: 139 mEq/L (ref 135–145)

## 2012-10-22 LAB — CBC
Hemoglobin: 9 g/dL — ABNORMAL LOW (ref 12.0–15.0)
MCH: 31.1 pg (ref 26.0–34.0)
MCV: 95.5 fL (ref 78.0–100.0)
RBC: 2.89 MIL/uL — ABNORMAL LOW (ref 3.87–5.11)

## 2012-10-22 LAB — GLUCOSE, CAPILLARY: Glucose-Capillary: 173 mg/dL — ABNORMAL HIGH (ref 70–99)

## 2012-10-22 MED ORDER — POTASSIUM CHLORIDE CRYS ER 20 MEQ PO TBCR
40.0000 meq | EXTENDED_RELEASE_TABLET | Freq: Once | ORAL | Status: AC
  Administered 2012-10-22: 40 meq via ORAL
  Filled 2012-10-22: qty 2

## 2012-10-22 NOTE — Progress Notes (Signed)
Patient's CBG of 173 taken after the patient had already eaten breakfast.  Will continue to monitor.

## 2012-10-22 NOTE — Discharge Summary (Signed)
Physician Discharge Summary  [name] UEA:[id number] DOB: [birth year] DOA: 10/19/2012  PCP: Londell Moh, MD  Admit date: 10/19/2012 Discharge date: 10/22/2012  Time spent: 40 minutes  Recommendations for Outpatient Follow-up:  Home with home health PT Follow up with PCP in 1 week. Needs h&h checked and BP monitored  Discharge Diagnoses:  Principal Problem:  *Lower GI bleeding (diverticular bleed)  Active Problems: Diabetes mellitus type 2 Anemia Colonic diverticulosis Rheumatoid arthritis    Discharge Condition:  Fair  Diet recommendation: Diabetic  Filed Weights   10/20/12 0140  Weight: 56.7 kg (125 lb)    History of present illness:  79 y.o. female with a straight of rheumatoid arthritis on Plaquenil and Methotrexate, history of GERD  adenomatous colon polyps and diverticular bleeding She was hospitalized September 2012 with deverticular bleed requiring transfusion. The prior to admission patient started having bright red blood in stool and advised to come to the ED where she had 3 more episodes of bright red blood per rectum. Patient was admitted to medical floor for monitoring of her diverticular bleed. She did not have significant drop in her hemoglobin. She did not require any transfusion. She was seen by [facility] who recommended on slowly advancing her diet and has tolerated it well.     Hospital Course:  Bright red bleeding per rectum Likely secondary to diverticular bleed in the setting off diffuse colonic diverticulosis. Patient monitoring the hospital and slowly advance diet. Had a small amount of blood in stool last evening but remains asymptomatic. Her hemoglobin stable at 9 today. She has not required any transfusions. Discussed with [facility] who recommended stable for discharge with outpatient PCP followup. Patient given instruction on calling her M.D. or returning to the ED if she has persistent lower GI bleed or any symptoms  associated with it.  Diabetes mellitus Continue home medications  Hypertension Continue home blood pressure medication. Blood pressure of 92/37 this morning and should be rechecked during her PCP visit in 1 week.  Hypothyroidism Continue Synthroid  Patient stable for discharge home with home health PT. She recently had an ORIF of right distal femur fracture and was at SNF for few weeks.  Procedures:  None  Consultations:  [facility]  Discharge Exam: Filed Vitals:   10/21/12 1359 10/21/12 2101 10/22/12 0506 10/22/12 0921  BP: 107/59 111/54 121/59 92/37  Pulse: 90 91 94 101  Temp: 99 F (37.2 C) 96.8 F (36 C) 96.9 F (36.1 C)   TempSrc: Oral Oral Oral   Resp: 18 16 18    Height:      Weight:      SpO2: 100% 100% 100%     General: Elderly female lying in bed in no acute distress HEENT: No pallor, moist oral mucosa Cardiovascular: Normal S1 and S2, no murmurs rub or gallop Respiratory: Clear to auscultation bilaterally, no added sounds Abdomen: Soft, nontender, nondistended, bowel sounds present Extremities: Warm, no edema, significant immature arthritis deformity of her hands CNS: AAO X3  Discharge Instructions     Medication List     As of 10/22/2012 11:03 AM    TAKE these medications         calcium-vitamin D 500-200 MG-UNIT per tablet   Commonly known as: OSCAL WITH D   Take 2 tablets by mouth daily.      folic acid 1 MG tablet   Commonly known as: FOLVITE   Take 1 mg by mouth daily.      glimepiride 1 MG  tablet   Commonly known as: AMARYL   Take 0.5 mg by mouth daily.      hydroxychloroquine 200 MG tablet   Commonly known as: PLAQUENIL   Take 200 mg by mouth 2 (two) times daily.      levothyroxine 137 MCG tablet   Commonly known as: SYNTHROID, LEVOTHROID   Take 137 mcg by mouth every morning.      LIVALO 2 MG Tabs   Generic drug: Pitavastatin Calcium   Take 2 mg by mouth every Monday, Wednesday, and Friday at 6 PM.      metFORMIN 500 MG  tablet   Commonly known as: GLUCOPHAGE   Take 500 mg by mouth 2 (two) times daily.      methotrexate 2.5 MG tablet   Commonly known as: RHEUMATREX   Take 25 mg by mouth every Wednesday. Take 10 pills by mouth every Wednesday      multivitamin with minerals Tabs   Take 1 tablet by mouth daily.      telmisartan 80 MG tablet   Commonly known as: MICARDIS   Take 40 mg by mouth daily.           Follow-up Information    Follow up with Londell Moh, MD. In 1 week.   Contact information:   [address] [place] [address] [phone]           The results of significant diagnostics from this hospitalization (including imaging, microbiology, ancillary and laboratory) are listed below for reference.    Significant Diagnostic Studies: No results found.  Microbiology: No results found for this or any previous visit (from the past 240 hour(s)).   Labs: Basic Metabolic Panel:  Lab 10/22/12 9147 10/21/12 0457 10/20/12 0459 10/19/12 2140  NA 139 140 137 137  K 3.6 3.6 3.9 4.2  CL 103 108 105 102  CO2 26 24 23 24   GLUCOSE 113* 105* 149* 148*  BUN 9 11 16 18   CREATININE 0.67 0.60 0.59 0.68  CALCIUM 8.6 8.3* 8.3* 9.1  MG -- -- -- --  PHOS -- -- -- --   Liver Function Tests:  Lab 10/19/12 2140  AST 16  ALT 14  ALKPHOS 118*  BILITOT 0.3  PROT 6.6  ALBUMIN 3.6   No results found for this basename: LIPASE:5,AMYLASE:5 in the last 168 hours No results found for this basename: AMMONIA:5 in the last 168 hours CBC:  Lab 10/22/12 0423 10/21/12 0457 10/20/12 1430 10/20/12 0459 10/19/12 2140  WBC 8.5 6.6 8.3 7.4 10.6*  NEUTROABS -- -- -- -- 8.6*  HGB 9.0* 9.0* 9.5* 10.0* 12.1  HCT 27.6* 27.3* 28.1* 30.6* 37.3  MCV 95.5 95.5 95.3 95.6 95.9  PLT 243 233 276 247 310   Cardiac Enzymes: No results found for this basename: CKTOTAL:5,CKMB:5,CKMBINDEX:5,TROPONINI:5 in the last 168 hours BNP: BNP (last 3 results) No results found for this basename:  PROBNP:3 in the last 8760 hours CBG:  Lab 10/22/12 0848 10/21/12 2059 10/21/12 1649 10/21/12 1145 10/21/12 0750  GLUCAP 173* 139* 135* 84 101*       Signed:  Joie Hipps  [facility] 10/22/2012, 11:03 AM

## 2012-10-22 NOTE — Progress Notes (Signed)
CSW received consult for transportation issues. Patient states she was unsure whether she would have a ride willing to come and get her today. Patient provided with quote from [facility] transportation & [employer].   Patient's friend picked patient up for transport home. CSW signing off.   Unice Bailey, LCSW [facility] Clinical Social Worker cell #: [phone]

## 2012-10-23 DIAGNOSIS — IMO0001 Reserved for inherently not codable concepts without codable children: Secondary | ICD-10-CM | POA: Diagnosis not present

## 2012-10-23 DIAGNOSIS — E119 Type 2 diabetes mellitus without complications: Secondary | ICD-10-CM | POA: Diagnosis not present

## 2012-10-23 DIAGNOSIS — S72009D Fracture of unspecified part of neck of unspecified femur, subsequent encounter for closed fracture with routine healing: Secondary | ICD-10-CM | POA: Diagnosis not present

## 2012-10-27 DIAGNOSIS — S72009D Fracture of unspecified part of neck of unspecified femur, subsequent encounter for closed fracture with routine healing: Secondary | ICD-10-CM | POA: Diagnosis not present

## 2012-10-27 DIAGNOSIS — IMO0001 Reserved for inherently not codable concepts without codable children: Secondary | ICD-10-CM | POA: Diagnosis not present

## 2012-10-27 DIAGNOSIS — E119 Type 2 diabetes mellitus without complications: Secondary | ICD-10-CM | POA: Diagnosis not present

## 2012-10-28 DIAGNOSIS — N390 Urinary tract infection, site not specified: Secondary | ICD-10-CM | POA: Diagnosis not present

## 2012-10-28 DIAGNOSIS — R300 Dysuria: Secondary | ICD-10-CM | POA: Diagnosis not present

## 2012-10-28 DIAGNOSIS — I10 Essential (primary) hypertension: Secondary | ICD-10-CM | POA: Diagnosis not present

## 2012-10-28 DIAGNOSIS — R5381 Other malaise: Secondary | ICD-10-CM | POA: Diagnosis not present

## 2012-10-28 DIAGNOSIS — D500 Iron deficiency anemia secondary to blood loss (chronic): Secondary | ICD-10-CM | POA: Diagnosis not present

## 2012-10-29 DIAGNOSIS — S72009D Fracture of unspecified part of neck of unspecified femur, subsequent encounter for closed fracture with routine healing: Secondary | ICD-10-CM | POA: Diagnosis not present

## 2012-10-29 DIAGNOSIS — IMO0001 Reserved for inherently not codable concepts without codable children: Secondary | ICD-10-CM | POA: Diagnosis not present

## 2012-10-29 DIAGNOSIS — E119 Type 2 diabetes mellitus without complications: Secondary | ICD-10-CM | POA: Diagnosis not present

## 2012-10-31 DIAGNOSIS — IMO0001 Reserved for inherently not codable concepts without codable children: Secondary | ICD-10-CM | POA: Diagnosis not present

## 2012-10-31 DIAGNOSIS — E119 Type 2 diabetes mellitus without complications: Secondary | ICD-10-CM | POA: Diagnosis not present

## 2012-10-31 DIAGNOSIS — S72009D Fracture of unspecified part of neck of unspecified femur, subsequent encounter for closed fracture with routine healing: Secondary | ICD-10-CM | POA: Diagnosis not present

## 2012-11-03 DIAGNOSIS — S72009D Fracture of unspecified part of neck of unspecified femur, subsequent encounter for closed fracture with routine healing: Secondary | ICD-10-CM | POA: Diagnosis not present

## 2012-11-03 DIAGNOSIS — E119 Type 2 diabetes mellitus without complications: Secondary | ICD-10-CM | POA: Diagnosis not present

## 2012-11-03 DIAGNOSIS — IMO0001 Reserved for inherently not codable concepts without codable children: Secondary | ICD-10-CM | POA: Diagnosis not present

## 2012-11-07 DIAGNOSIS — IMO0001 Reserved for inherently not codable concepts without codable children: Secondary | ICD-10-CM | POA: Diagnosis not present

## 2012-11-07 DIAGNOSIS — S72009D Fracture of unspecified part of neck of unspecified femur, subsequent encounter for closed fracture with routine healing: Secondary | ICD-10-CM | POA: Diagnosis not present

## 2012-11-07 DIAGNOSIS — E119 Type 2 diabetes mellitus without complications: Secondary | ICD-10-CM | POA: Diagnosis not present

## 2012-11-10 DIAGNOSIS — IMO0001 Reserved for inherently not codable concepts without codable children: Secondary | ICD-10-CM | POA: Diagnosis not present

## 2012-11-10 DIAGNOSIS — S72009D Fracture of unspecified part of neck of unspecified femur, subsequent encounter for closed fracture with routine healing: Secondary | ICD-10-CM | POA: Diagnosis not present

## 2012-11-10 DIAGNOSIS — E119 Type 2 diabetes mellitus without complications: Secondary | ICD-10-CM | POA: Diagnosis not present

## 2012-11-12 DIAGNOSIS — M1710 Unilateral primary osteoarthritis, unspecified knee: Secondary | ICD-10-CM | POA: Diagnosis not present

## 2012-11-12 DIAGNOSIS — Z4789 Encounter for other orthopedic aftercare: Secondary | ICD-10-CM | POA: Diagnosis not present

## 2012-11-13 DIAGNOSIS — IMO0001 Reserved for inherently not codable concepts without codable children: Secondary | ICD-10-CM | POA: Diagnosis not present

## 2012-11-13 DIAGNOSIS — S72009D Fracture of unspecified part of neck of unspecified femur, subsequent encounter for closed fracture with routine healing: Secondary | ICD-10-CM | POA: Diagnosis not present

## 2012-11-13 DIAGNOSIS — E119 Type 2 diabetes mellitus without complications: Secondary | ICD-10-CM | POA: Diagnosis not present

## 2012-11-14 DIAGNOSIS — E119 Type 2 diabetes mellitus without complications: Secondary | ICD-10-CM | POA: Diagnosis not present

## 2012-11-14 DIAGNOSIS — IMO0001 Reserved for inherently not codable concepts without codable children: Secondary | ICD-10-CM | POA: Diagnosis not present

## 2012-11-14 DIAGNOSIS — S72009D Fracture of unspecified part of neck of unspecified femur, subsequent encounter for closed fracture with routine healing: Secondary | ICD-10-CM | POA: Diagnosis not present

## 2012-11-19 DIAGNOSIS — E119 Type 2 diabetes mellitus without complications: Secondary | ICD-10-CM | POA: Diagnosis not present

## 2012-11-19 DIAGNOSIS — S72009D Fracture of unspecified part of neck of unspecified femur, subsequent encounter for closed fracture with routine healing: Secondary | ICD-10-CM | POA: Diagnosis not present

## 2012-11-19 DIAGNOSIS — IMO0001 Reserved for inherently not codable concepts without codable children: Secondary | ICD-10-CM | POA: Diagnosis not present

## 2012-11-20 DIAGNOSIS — E119 Type 2 diabetes mellitus without complications: Secondary | ICD-10-CM | POA: Diagnosis not present

## 2012-11-20 DIAGNOSIS — S72009D Fracture of unspecified part of neck of unspecified femur, subsequent encounter for closed fracture with routine healing: Secondary | ICD-10-CM | POA: Diagnosis not present

## 2012-11-20 DIAGNOSIS — IMO0001 Reserved for inherently not codable concepts without codable children: Secondary | ICD-10-CM | POA: Diagnosis not present

## 2012-11-24 DIAGNOSIS — E119 Type 2 diabetes mellitus without complications: Secondary | ICD-10-CM | POA: Diagnosis not present

## 2012-11-24 DIAGNOSIS — S72009D Fracture of unspecified part of neck of unspecified femur, subsequent encounter for closed fracture with routine healing: Secondary | ICD-10-CM | POA: Diagnosis not present

## 2012-11-24 DIAGNOSIS — IMO0001 Reserved for inherently not codable concepts without codable children: Secondary | ICD-10-CM | POA: Diagnosis not present

## 2012-11-26 DIAGNOSIS — S72009D Fracture of unspecified part of neck of unspecified femur, subsequent encounter for closed fracture with routine healing: Secondary | ICD-10-CM | POA: Diagnosis not present

## 2012-11-26 DIAGNOSIS — IMO0001 Reserved for inherently not codable concepts without codable children: Secondary | ICD-10-CM | POA: Diagnosis not present

## 2012-11-26 DIAGNOSIS — E119 Type 2 diabetes mellitus without complications: Secondary | ICD-10-CM | POA: Diagnosis not present

## 2012-12-01 DIAGNOSIS — R748 Abnormal levels of other serum enzymes: Secondary | ICD-10-CM | POA: Diagnosis not present

## 2012-12-01 DIAGNOSIS — E119 Type 2 diabetes mellitus without complications: Secondary | ICD-10-CM | POA: Diagnosis not present

## 2012-12-01 DIAGNOSIS — D500 Iron deficiency anemia secondary to blood loss (chronic): Secondary | ICD-10-CM | POA: Diagnosis not present

## 2012-12-01 DIAGNOSIS — D649 Anemia, unspecified: Secondary | ICD-10-CM | POA: Diagnosis not present

## 2012-12-01 DIAGNOSIS — I10 Essential (primary) hypertension: Secondary | ICD-10-CM | POA: Diagnosis not present

## 2012-12-01 DIAGNOSIS — Z79899 Other long term (current) drug therapy: Secondary | ICD-10-CM | POA: Diagnosis not present

## 2012-12-02 DIAGNOSIS — E119 Type 2 diabetes mellitus without complications: Secondary | ICD-10-CM | POA: Diagnosis not present

## 2012-12-02 DIAGNOSIS — IMO0001 Reserved for inherently not codable concepts without codable children: Secondary | ICD-10-CM | POA: Diagnosis not present

## 2012-12-02 DIAGNOSIS — S72009D Fracture of unspecified part of neck of unspecified femur, subsequent encounter for closed fracture with routine healing: Secondary | ICD-10-CM | POA: Diagnosis not present

## 2012-12-05 DIAGNOSIS — E119 Type 2 diabetes mellitus without complications: Secondary | ICD-10-CM | POA: Diagnosis not present

## 2012-12-05 DIAGNOSIS — S72009D Fracture of unspecified part of neck of unspecified femur, subsequent encounter for closed fracture with routine healing: Secondary | ICD-10-CM | POA: Diagnosis not present

## 2012-12-05 DIAGNOSIS — IMO0001 Reserved for inherently not codable concepts without codable children: Secondary | ICD-10-CM | POA: Diagnosis not present

## 2012-12-08 DIAGNOSIS — S72009D Fracture of unspecified part of neck of unspecified femur, subsequent encounter for closed fracture with routine healing: Secondary | ICD-10-CM | POA: Diagnosis not present

## 2012-12-08 DIAGNOSIS — E119 Type 2 diabetes mellitus without complications: Secondary | ICD-10-CM | POA: Diagnosis not present

## 2012-12-08 DIAGNOSIS — IMO0001 Reserved for inherently not codable concepts without codable children: Secondary | ICD-10-CM | POA: Diagnosis not present

## 2012-12-10 DIAGNOSIS — S72009D Fracture of unspecified part of neck of unspecified femur, subsequent encounter for closed fracture with routine healing: Secondary | ICD-10-CM | POA: Diagnosis not present

## 2012-12-10 DIAGNOSIS — IMO0001 Reserved for inherently not codable concepts without codable children: Secondary | ICD-10-CM | POA: Diagnosis not present

## 2012-12-10 DIAGNOSIS — E119 Type 2 diabetes mellitus without complications: Secondary | ICD-10-CM | POA: Diagnosis not present

## 2012-12-11 DIAGNOSIS — E119 Type 2 diabetes mellitus without complications: Secondary | ICD-10-CM | POA: Diagnosis not present

## 2012-12-11 DIAGNOSIS — S72009D Fracture of unspecified part of neck of unspecified femur, subsequent encounter for closed fracture with routine healing: Secondary | ICD-10-CM | POA: Diagnosis not present

## 2012-12-11 DIAGNOSIS — IMO0001 Reserved for inherently not codable concepts without codable children: Secondary | ICD-10-CM | POA: Diagnosis not present

## 2012-12-15 DIAGNOSIS — M8440XA Pathological fracture, unspecified site, initial encounter for fracture: Secondary | ICD-10-CM | POA: Diagnosis not present

## 2012-12-15 DIAGNOSIS — M069 Rheumatoid arthritis, unspecified: Secondary | ICD-10-CM | POA: Diagnosis not present

## 2012-12-17 DIAGNOSIS — M25559 Pain in unspecified hip: Secondary | ICD-10-CM | POA: Diagnosis not present

## 2012-12-22 DIAGNOSIS — M1710 Unilateral primary osteoarthritis, unspecified knee: Secondary | ICD-10-CM | POA: Diagnosis not present

## 2012-12-24 DIAGNOSIS — M1710 Unilateral primary osteoarthritis, unspecified knee: Secondary | ICD-10-CM | POA: Diagnosis not present

## 2012-12-26 DIAGNOSIS — M25559 Pain in unspecified hip: Secondary | ICD-10-CM | POA: Diagnosis not present

## 2012-12-29 DIAGNOSIS — M25559 Pain in unspecified hip: Secondary | ICD-10-CM | POA: Diagnosis not present

## 2012-12-31 DIAGNOSIS — M25559 Pain in unspecified hip: Secondary | ICD-10-CM | POA: Diagnosis not present

## 2013-01-02 DIAGNOSIS — M25559 Pain in unspecified hip: Secondary | ICD-10-CM | POA: Diagnosis not present

## 2013-01-05 DIAGNOSIS — M25559 Pain in unspecified hip: Secondary | ICD-10-CM | POA: Diagnosis not present

## 2013-01-07 DIAGNOSIS — M25559 Pain in unspecified hip: Secondary | ICD-10-CM | POA: Diagnosis not present

## 2013-01-08 DIAGNOSIS — M25559 Pain in unspecified hip: Secondary | ICD-10-CM | POA: Diagnosis not present

## 2013-01-12 DIAGNOSIS — E7800 Pure hypercholesterolemia, unspecified: Secondary | ICD-10-CM | POA: Diagnosis not present

## 2013-01-12 DIAGNOSIS — M25559 Pain in unspecified hip: Secondary | ICD-10-CM | POA: Diagnosis not present

## 2013-01-12 DIAGNOSIS — E119 Type 2 diabetes mellitus without complications: Secondary | ICD-10-CM | POA: Diagnosis not present

## 2013-01-14 DIAGNOSIS — M25559 Pain in unspecified hip: Secondary | ICD-10-CM | POA: Diagnosis not present

## 2013-01-16 DIAGNOSIS — M25559 Pain in unspecified hip: Secondary | ICD-10-CM | POA: Diagnosis not present

## 2013-01-19 DIAGNOSIS — M8440XA Pathological fracture, unspecified site, initial encounter for fracture: Secondary | ICD-10-CM | POA: Diagnosis not present

## 2013-01-19 DIAGNOSIS — I10 Essential (primary) hypertension: Secondary | ICD-10-CM | POA: Diagnosis not present

## 2013-01-19 DIAGNOSIS — M25559 Pain in unspecified hip: Secondary | ICD-10-CM | POA: Diagnosis not present

## 2013-01-19 DIAGNOSIS — E039 Hypothyroidism, unspecified: Secondary | ICD-10-CM | POA: Diagnosis not present

## 2013-01-19 DIAGNOSIS — E119 Type 2 diabetes mellitus without complications: Secondary | ICD-10-CM | POA: Diagnosis not present

## 2013-01-26 DIAGNOSIS — M25559 Pain in unspecified hip: Secondary | ICD-10-CM | POA: Diagnosis not present

## 2013-01-26 DIAGNOSIS — Z1231 Encounter for screening mammogram for malignant neoplasm of breast: Secondary | ICD-10-CM | POA: Diagnosis not present

## 2013-01-28 DIAGNOSIS — M25559 Pain in unspecified hip: Secondary | ICD-10-CM | POA: Diagnosis not present

## 2013-01-30 DIAGNOSIS — M25559 Pain in unspecified hip: Secondary | ICD-10-CM | POA: Diagnosis not present

## 2013-02-06 DIAGNOSIS — M25569 Pain in unspecified knee: Secondary | ICD-10-CM | POA: Diagnosis not present

## 2013-02-06 DIAGNOSIS — M25559 Pain in unspecified hip: Secondary | ICD-10-CM | POA: Diagnosis not present

## 2013-02-06 DIAGNOSIS — Z4789 Encounter for other orthopedic aftercare: Secondary | ICD-10-CM | POA: Diagnosis not present

## 2013-02-09 DIAGNOSIS — M25559 Pain in unspecified hip: Secondary | ICD-10-CM | POA: Diagnosis not present

## 2013-02-11 DIAGNOSIS — M25559 Pain in unspecified hip: Secondary | ICD-10-CM | POA: Diagnosis not present

## 2013-02-13 DIAGNOSIS — M25559 Pain in unspecified hip: Secondary | ICD-10-CM | POA: Diagnosis not present

## 2013-02-18 DIAGNOSIS — M25559 Pain in unspecified hip: Secondary | ICD-10-CM | POA: Diagnosis not present

## 2013-02-20 DIAGNOSIS — B351 Tinea unguium: Secondary | ICD-10-CM | POA: Diagnosis not present

## 2013-02-20 DIAGNOSIS — M25559 Pain in unspecified hip: Secondary | ICD-10-CM | POA: Diagnosis not present

## 2013-02-20 DIAGNOSIS — M79609 Pain in unspecified limb: Secondary | ICD-10-CM | POA: Diagnosis not present

## 2013-02-23 DIAGNOSIS — M8440XA Pathological fracture, unspecified site, initial encounter for fracture: Secondary | ICD-10-CM | POA: Diagnosis not present

## 2013-02-23 DIAGNOSIS — M25559 Pain in unspecified hip: Secondary | ICD-10-CM | POA: Diagnosis not present

## 2013-02-23 DIAGNOSIS — M069 Rheumatoid arthritis, unspecified: Secondary | ICD-10-CM | POA: Diagnosis not present

## 2013-02-25 DIAGNOSIS — M25559 Pain in unspecified hip: Secondary | ICD-10-CM | POA: Diagnosis not present

## 2013-03-02 DIAGNOSIS — E039 Hypothyroidism, unspecified: Secondary | ICD-10-CM | POA: Diagnosis not present

## 2013-03-02 DIAGNOSIS — E7800 Pure hypercholesterolemia, unspecified: Secondary | ICD-10-CM | POA: Diagnosis not present

## 2013-03-02 DIAGNOSIS — D649 Anemia, unspecified: Secondary | ICD-10-CM | POA: Diagnosis not present

## 2013-03-02 DIAGNOSIS — Z006 Encounter for examination for normal comparison and control in clinical research program: Secondary | ICD-10-CM | POA: Diagnosis not present

## 2013-03-02 DIAGNOSIS — M25559 Pain in unspecified hip: Secondary | ICD-10-CM | POA: Diagnosis not present

## 2013-03-02 DIAGNOSIS — R300 Dysuria: Secondary | ICD-10-CM | POA: Diagnosis not present

## 2013-03-02 DIAGNOSIS — L570 Actinic keratosis: Secondary | ICD-10-CM | POA: Diagnosis not present

## 2013-03-04 DIAGNOSIS — M25559 Pain in unspecified hip: Secondary | ICD-10-CM | POA: Diagnosis not present

## 2013-03-09 DIAGNOSIS — M25559 Pain in unspecified hip: Secondary | ICD-10-CM | POA: Diagnosis not present

## 2013-03-13 DIAGNOSIS — M25559 Pain in unspecified hip: Secondary | ICD-10-CM | POA: Diagnosis not present

## 2013-03-16 DIAGNOSIS — M25559 Pain in unspecified hip: Secondary | ICD-10-CM | POA: Diagnosis not present

## 2013-03-18 DIAGNOSIS — Z9889 Other specified postprocedural states: Secondary | ICD-10-CM | POA: Diagnosis not present

## 2013-04-20 DIAGNOSIS — M549 Dorsalgia, unspecified: Secondary | ICD-10-CM | POA: Diagnosis not present

## 2013-04-24 DIAGNOSIS — M549 Dorsalgia, unspecified: Secondary | ICD-10-CM | POA: Diagnosis not present

## 2013-05-01 DIAGNOSIS — S22009A Unspecified fracture of unspecified thoracic vertebra, initial encounter for closed fracture: Secondary | ICD-10-CM | POA: Diagnosis not present

## 2013-05-18 DIAGNOSIS — E119 Type 2 diabetes mellitus without complications: Secondary | ICD-10-CM | POA: Diagnosis not present

## 2013-05-18 DIAGNOSIS — E7800 Pure hypercholesterolemia, unspecified: Secondary | ICD-10-CM | POA: Diagnosis not present

## 2013-05-18 DIAGNOSIS — E039 Hypothyroidism, unspecified: Secondary | ICD-10-CM | POA: Diagnosis not present

## 2013-05-19 DIAGNOSIS — Q828 Other specified congenital malformations of skin: Secondary | ICD-10-CM | POA: Diagnosis not present

## 2013-05-19 DIAGNOSIS — B351 Tinea unguium: Secondary | ICD-10-CM | POA: Diagnosis not present

## 2013-05-19 DIAGNOSIS — M79609 Pain in unspecified limb: Secondary | ICD-10-CM | POA: Diagnosis not present

## 2013-05-20 DIAGNOSIS — M545 Low back pain: Secondary | ICD-10-CM | POA: Diagnosis not present

## 2013-05-27 DIAGNOSIS — M1710 Unilateral primary osteoarthritis, unspecified knee: Secondary | ICD-10-CM | POA: Diagnosis not present

## 2013-05-27 DIAGNOSIS — M5137 Other intervertebral disc degeneration, lumbosacral region: Secondary | ICD-10-CM | POA: Diagnosis not present

## 2013-06-01 DIAGNOSIS — M8440XA Pathological fracture, unspecified site, initial encounter for fracture: Secondary | ICD-10-CM | POA: Diagnosis not present

## 2013-06-01 DIAGNOSIS — M069 Rheumatoid arthritis, unspecified: Secondary | ICD-10-CM | POA: Diagnosis not present

## 2013-06-02 DIAGNOSIS — E7800 Pure hypercholesterolemia, unspecified: Secondary | ICD-10-CM | POA: Diagnosis not present

## 2013-06-02 DIAGNOSIS — E119 Type 2 diabetes mellitus without complications: Secondary | ICD-10-CM | POA: Diagnosis not present

## 2013-06-02 DIAGNOSIS — I10 Essential (primary) hypertension: Secondary | ICD-10-CM | POA: Diagnosis not present

## 2013-06-10 DIAGNOSIS — M19079 Primary osteoarthritis, unspecified ankle and foot: Secondary | ICD-10-CM | POA: Diagnosis not present

## 2013-06-10 DIAGNOSIS — M2010 Hallux valgus (acquired), unspecified foot: Secondary | ICD-10-CM | POA: Diagnosis not present

## 2013-06-10 DIAGNOSIS — M2040 Other hammer toe(s) (acquired), unspecified foot: Secondary | ICD-10-CM | POA: Diagnosis not present

## 2013-06-22 DIAGNOSIS — M069 Rheumatoid arthritis, unspecified: Secondary | ICD-10-CM | POA: Diagnosis not present

## 2013-06-22 DIAGNOSIS — Z0000 Encounter for general adult medical examination without abnormal findings: Secondary | ICD-10-CM | POA: Diagnosis not present

## 2013-06-29 DIAGNOSIS — E7800 Pure hypercholesterolemia, unspecified: Secondary | ICD-10-CM | POA: Diagnosis not present

## 2013-06-29 DIAGNOSIS — M1710 Unilateral primary osteoarthritis, unspecified knee: Secondary | ICD-10-CM | POA: Diagnosis not present

## 2013-06-29 DIAGNOSIS — Z1212 Encounter for screening for malignant neoplasm of rectum: Secondary | ICD-10-CM | POA: Diagnosis not present

## 2013-06-29 DIAGNOSIS — R634 Abnormal weight loss: Secondary | ICD-10-CM | POA: Diagnosis not present

## 2013-06-29 DIAGNOSIS — M069 Rheumatoid arthritis, unspecified: Secondary | ICD-10-CM | POA: Diagnosis not present

## 2013-06-29 DIAGNOSIS — L570 Actinic keratosis: Secondary | ICD-10-CM | POA: Diagnosis not present

## 2013-06-30 ENCOUNTER — Ambulatory Visit (INDEPENDENT_AMBULATORY_CARE_PROVIDER_SITE_OTHER): Payer: Medicare Other

## 2013-06-30 VITALS — BP 142/68 | HR 86 | Resp 12 | Ht 65.0 in | Wt 118.0 lb

## 2013-06-30 DIAGNOSIS — M2040 Other hammer toe(s) (acquired), unspecified foot: Secondary | ICD-10-CM | POA: Diagnosis not present

## 2013-06-30 DIAGNOSIS — M2010 Hallux valgus (acquired), unspecified foot: Secondary | ICD-10-CM | POA: Diagnosis not present

## 2013-06-30 DIAGNOSIS — M069 Rheumatoid arthritis, unspecified: Secondary | ICD-10-CM

## 2013-06-30 DIAGNOSIS — Z79899 Other long term (current) drug therapy: Secondary | ICD-10-CM | POA: Diagnosis not present

## 2013-06-30 NOTE — Patient Instructions (Addendum)
Rheumatoid Arthritis Rheumatoid arthritis is a long-term (chronic) inflammatory disease that causes pain, swelling, and stiffness of the joints. It can affect the entire body, including the eyes and lungs. The effects of rheumatoid arthritis vary widely among those with the condition. CAUSES  The cause of rheumatoid arthritis is not known. It tends to run in families and is more common in women. Certain cells of the body's natural defense system (immune system) do not work properly and begin to attack healthy joints. It primarily involves the connective tissue that lines the joints (synovial membrane). This can cause damage to the joint. SYMPTOMS   Pain, stiffness, swelling, and decreased motion of many joints, especially in the hands and feet.  Stiffness that is worse in the morning. It may last 1 2 hours or longer.  Numbness and tingling in the hands.  Fatigue.  Loss of appetite.  Weight loss.  Low-grade fever.  Dry eyes and mouth.  Firm lumps (rheumatoid nodules) that grow beneath the skin in areas such as the elbows and hands. DIAGNOSIS  Diagnosis is based on the symptoms described, an exam, and blood tests. Sometimes, X-rays are helpful. TREATMENT  The goals of treatment are to relieve pain, reduce inflammation, and to slow down or stop joint damage and disability. Methods vary and may include:  Maintaining a balance of rest, exercise, and proper nutrition.  Medicines:  Pain relievers (analgesics).  Corticosteroids and nonsteroidal anti-inflammatory drugs (NSAIDs) to reduce inflammation.  Disease-modifying antirheumatic drugs (DMARDs) to try to slow the course of the disease.  Biologic response modifiers to reduce inflammation and damage.  Physical therapy and occupational therapy.  Surgery for patients with severe joint damage. Joint replacement or fusing of joints may be needed.  Routine monitoring and ongoing care, such as office visits, blood and urine tests, and  X-rays. HOME CARE INSTRUCTIONS   Remain physically active and reduce activity when the disease gets worse.  Eat a well-balanced diet.  Put heat on affected joints when you wake up and before activities. Keep the heat on the affected joint for as long as directed by your caregiver.  Put ice on affected joints following activities or exercising.  Put ice in a plastic bag.  Place a towel between your skin and the bag.  Leave the ice on for 15-20 minutes, 3-4 times a day.  Take all medicines and supplements as directed by your caregiver.  Use splints as directed by your caregiver. Splints help maintain joint position and function.  Do not sleep with pillows under your knees. This may lead to spasms.  Participate in a self-management program to keep current with the latest treatment and coping skills. SEEK IMMEDIATE MEDICAL CARE IF:  You have fainting episodes.  You have periods of extreme weakness.  You rapidly develop a hot, painful joint that is more severe than usual joint aches.  You have chills.  You have a fever. MAKE SURE YOU:  Understand these instructions.  Will watch your condition.  Will get help right away if you are not doing well or get worse. FOR MORE INFORMATION  American College of Rheumatology: www.rheumatology.org Arthritis Foundation: www.arthritis.org Document Released: 09/07/2000 Document Revised: 03/11/2012 Document Reviewed: 10/17/2011         [facility] Patient Information 2014 [place], [place]. Hammer Toes Hammer toes occur when the joint in one or more of your toes is permanently flexed. CAUSES  This happens when a muscle imbalance or abnormal bone length makes the small toes buckle under. This causes the  toe joint to contract. This causes the tendons (cord like structure) to shorten.  SYMPTOMS   When hammer toes are flexible, you can straighten the buckled joint with your hand. Flexible hammer toes may develop into rigid hammer toes  over time. Common symptoms of flexible hammer toes include:  Corns (build-ups of skin cells). Corns occur where boney bumps come in frequent contact with hard surfaces. For example, where your shoes press and rub.  Irritation.  Inflammation.  Pain.  Toe motion is limited.  When a rigid hammer toe is fixed you can no longer straighten the buckled joint. Corns, irritation, pain, and loss of motion is generally worse for rigid hammer toes than for flexible ones. TREATMENT  The problems noted above if painful or troublesome can be corrected with surgery. This is an elective surgery, so you can pick a convenient time for the procedure. The surgery may:  Improve appearance.  Relieve pain.  Improve function. You may be asked not to put weight on this foot for a few weeks. There are several types of surgical treatments. Common treatments are listed below. Your surgeon will discuss what is be best for you.   With arthroplasty, a portion of the joint is surgically removed and the toe is straightened. The "gap" fills in with fibroustissue. This helps with pain, deformity and function.  With fusion, cartilage between the two bones is taken out and the bones heal as one longer bone. This helps keep the toe stable and reduces pain but leaves the toe stiff, yet straight.  With implant, a portion of the bone is removed and replaced with an implant to restore motion.  Flexor tendon transfers may be used to release the deforming force which buckles the toe. This is done by the repositioning of the tendons that curl the toes down (flexor tendons). Several of these options require fixing the toe with a pin that is visible at the tip of the toe. The pin keeps the toe straight during healing. It is generally removed in the office at 4-8 weeks after the corrective procedure. Generally, removing the pin is not painful.  LET YOUR CAREGIVER KNOW ABOUT:  Allergies.  Medications taken including herbs, eye  drops, over the counter medications, and creams.  Use of steroids (by mouth or creams).  Previous problems with anesthetics or novocaine.  Possibility of pregnancy, if this applies.  History of blood clots (thrombophlebitis).  History of bleeding or blood problems.  Previous surgery.  Other health problems.  Family history of anesthetic problems. BEFORE THE PROCEDURE You should be present 60 minutes prior to your procedure unless otherwise directed by your caregiver.  RISKS AND COMPLICATIONS  If surgery is recommended, your caregiver will explain your foot problem and how surgery can improve it. Your caregiver can answer questions you may have about potential risks and complications involved.  Let your caregiver know about health changes prior to surgery. It is best to do elective surgeries when you are healthy. Be sure to ask your caregiver how long you will be off your feet and if you need to be off work. Plan accordingly. Your foot and ankle may be immobilized by a cast (from your toes to below your knee). You may be asked not to bear weight on this foot for a few weeks. AFTER THE PROCEDURE You can bear weight as instructed. You may need a bandage, splint, and removable cast boot or surgical shoe for several weeks after surgery. You may resume normal diet and  activities as directed. Only take over-the-counter or prescription medicines for pain, discomfort, or fever as directed by your caregiver. SEEK MEDICAL CARE IF:   You have increased bleeding (more than a small spot) from the wound.  You notice redness, swelling, or increasing pain in the wound.  You notice pus coming from the wound or the pin that is used to stabilize the toe.  You notice a bad smell coming from the wound or dressing. SEEK IMMEDIATE MEDICAL CARE IF:   You have a fever.  You develop a rash.  You have difficulty breathing.  You have any allergic problems. Document Released: 09/07/2000 Document Revised:  12/03/2011 Document Reviewed: 10/08/2008 [facility] Patient Information 2014 [place], [place].      Recommendation is to wear wide accommodative shoes with either lace up her Velcro design. Avoid slip on narrow or tapered type shoes. Followup with [facility]otech and consider another pair of extra-depth shoes.

## 2013-06-30 NOTE — Progress Notes (Signed)
  Subjective:    Patient ID: [name], female    DOB: 11-30-1928, 79 y.o.   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 Patient has plantar flexed metatarsal with dislocation subluxation first MTP area as well as multiple hammertoes bilateral. Status post amputation second left. Most recently had an infection secondary to hammertoe deformity and keratoses. The infection is resolved patient completed her antibiotic regimen. There is no open wound discharge or drainage currently. There continues to be some slight edema of the fourth toe left foot, however this correlates with contractures of all her digits and dislocations of the MTP joints as well. Patient is wearing slip on type loafer which is tapered and causing exacerbation of her symptoms. Patient is advised to wear a lace up or a Velcro closure [place]. Patient has depth shoes and is advised to use those when possible. Recommended more depth shoes in the future.   Review of Systems  Constitutional: Negative.   Eyes: Positive for visual disturbance.  Respiratory: Negative.   Cardiovascular: Positive for leg swelling.  Endocrine: Negative.   Genitourinary: Negative.   Neurological: Negative.   Hematological: Negative.   Psychiatric/Behavioral: Negative.        Objective:   Physical Exam neurovascular status intact and unchanged pedal pulses palpable, severe deformity of digits and HAV deformity noted with subluxation dislocation at the MTP and IP joints. Results keratoses with no open wounds or ulcerations noted current time. Keratosis of first left is debrided no open wounds or ulcerations identified. Patient has severe rheumatoid arthropathy with dislocations of the MTP joints both hallux inclined to contractures the remaining muscular. Absent hair growth out lateral. Significant varicosities both lower extremity is noted bilateral with pedal pulses mentioned  palpable.        Assessment & Plan:  Resolved infection secondary digital contractures to hammertoe deformity and rheumatoid arthritis. There is no discharge or drainage noted. Maintain tubercle padding which is dispensed. Maintain appropriate coming shoes which are recommended. Followup in 2 months for palliative skin and nail care as needed. Call if his if there is any exacerbations or worsening of symptoms. Recheck in 2 months for followup Alvan Dame DPM

## 2013-07-03 DIAGNOSIS — M48061 Spinal stenosis, lumbar region without neurogenic claudication: Secondary | ICD-10-CM | POA: Diagnosis not present

## 2013-07-13 DIAGNOSIS — M48061 Spinal stenosis, lumbar region without neurogenic claudication: Secondary | ICD-10-CM | POA: Diagnosis not present

## 2013-07-21 DIAGNOSIS — M48061 Spinal stenosis, lumbar region without neurogenic claudication: Secondary | ICD-10-CM | POA: Diagnosis not present

## 2013-07-27 DIAGNOSIS — R634 Abnormal weight loss: Secondary | ICD-10-CM | POA: Diagnosis not present

## 2013-07-27 DIAGNOSIS — R197 Diarrhea, unspecified: Secondary | ICD-10-CM | POA: Diagnosis not present

## 2013-07-27 DIAGNOSIS — M2170 Unequal limb length (acquired), unspecified site: Secondary | ICD-10-CM | POA: Diagnosis not present

## 2013-08-04 ENCOUNTER — Ambulatory Visit (INDEPENDENT_AMBULATORY_CARE_PROVIDER_SITE_OTHER): Payer: Medicare Other

## 2013-08-04 VITALS — BP 126/79 | HR 101 | Resp 16

## 2013-08-04 DIAGNOSIS — M069 Rheumatoid arthritis, unspecified: Secondary | ICD-10-CM | POA: Diagnosis not present

## 2013-08-04 DIAGNOSIS — Q828 Other specified congenital malformations of skin: Secondary | ICD-10-CM | POA: Diagnosis not present

## 2013-08-04 DIAGNOSIS — M2040 Other hammer toe(s) (acquired), unspecified foot: Secondary | ICD-10-CM

## 2013-08-04 DIAGNOSIS — M2010 Hallux valgus (acquired), unspecified foot: Secondary | ICD-10-CM

## 2013-08-04 NOTE — Progress Notes (Signed)
  Subjective:    Patient ID: JAMA MCMILLER, female    DOB: 01-19-1929, 79 y.o.   MRN: [id number] "I noticed a red spot on this callus and it hurts to walk on."  HPI no changes in medication her health history patient however is having a flareup of a painful lesion sub-first MTP area left foot.   Review of Systems no changes or you can new problems     Objective:   Physical Exam Neurovascular status is intact pedal pulses palpable dermatologically there is slight irritation of the medial first MTP area left foot and there is a hemorrhage a keratoses sub-first MTP area with a thick callus painful tender symptomatic. Patient wearing and soled shoes contrary to instructions she is going to [facility] and having some new shoes with Plastizote inlays made. Patient is notable HAV deformity and hammertoe deformities secondary to rheumatoid arthritis. Has atrophy of the plantar fat pad skin and this resulted in recurrent pre-ulcerative keratoses.       Assessment & Plan:  Rheumatoid arthropathy with pre-also keratoses secondary deformity. No current infection is identified the keratotic lesion is debrided likely an uncovered service or keratoses sub-first left debrided Neosporin and Band-Aid applied suggested to maintain Neosporin and Band-Aid and a cushioned thick soled shoe followup in the future on an as-needed basis for any additional palliative care  Alvan Dame DPM

## 2013-08-04 NOTE — Patient Instructions (Signed)
Bunion You have a bunion deformity of the feet. This is more common in women. It tends to be an inherited problem. Symptoms can include pain, swelling, and deformity around the great toe. Numbness and tingling may also be present. Your symptoms are often worsened by wearing shoes that cause pressure on the bunion. Changing the type of shoes you wear helps reduce symptoms. A wide shoe decreases pressure on the bunion. An arch support may be used if you have flat feet. Avoid shoes with heels higher than two inches. This puts more pressure on the bunion. X-rays may be helpful in evaluating the severity of the problem. Other foot problems often seen with bunions include corns, calluses, and hammer toes. If the deformity or pain is severe, surgical treatment may be necessary. Keep off your painful foot as much as possible until the pain is relieved. Call your caregiver if your symptoms are worse.  SEEK IMMEDIATE MEDICAL CARE IF:  You have increased redness, pain, swelling, or other symptoms of infection. Document Released: 09/10/2005 Document Revised: 12/03/2011 Document Reviewed: 03/10/2007 [facility] Patient Information 2014 [place], [place]. Corns and Calluses Corns are small areas of thickened skin that usually occur on the top, sides, or tip of a toe. They contain a cone-shaped core with a point that can press on a nerve below. This causes pain. Calluses are areas of thickened skin that usually develop on hands, fingers, palms, soles of the feet, and heels. These are areas that experience frequent friction or pressure. CAUSES  Corns are usually the result of rubbing (friction) or pressure from shoes that are too tight or do not fit properly. Calluses are caused by repeated friction and pressure on the affected areas. SYMPTOMS  A hard growth on the skin.  Pain or tenderness under the skin.  Sometimes, redness and swelling.  Increased discomfort while wearing tight-fitting shoes. DIAGNOSIS  Your  caregiver can usually tell what the problem is by doing a physical exam. TREATMENT  Removing the cause of the friction or pressure is usually the only treatment needed. However, sometimes medicines can be used to help soften the hardened, thickened areas. These medicines include salicylic acid plasters and 12% ammonium lactate lotion. These medicines should only be used under the direction of your caregiver. HOME CARE INSTRUCTIONS   Try to remove pressure from the affected area.  You may wear donut-shaped corn pads to protect your skin.  You may use a pumice stone or nonmetallic nail file to gently reduce the thickness of a corn.  Wear properly fitted footwear.  If you have calluses on the hands, wear gloves during activities that cause friction.  If you have diabetes, you should regularly examine your feet. Tell your caregiver if you notice any problems with your feet. SEEK IMMEDIATE MEDICAL CARE IF:   You have increased pain, swelling, redness, or warmth in the affected area.  Your corn or callus starts to drain fluid or bleeds.  You are not getting better, even with treatment. Document Released: 06/16/2004 Document Revised: 12/03/2011 Document Reviewed: 05/08/2011 [facility] Patient Information 2014 [place], [place].

## 2013-08-14 ENCOUNTER — Ambulatory Visit: Payer: Self-pay

## 2013-08-31 DIAGNOSIS — E7800 Pure hypercholesterolemia, unspecified: Secondary | ICD-10-CM | POA: Diagnosis not present

## 2013-08-31 DIAGNOSIS — E119 Type 2 diabetes mellitus without complications: Secondary | ICD-10-CM | POA: Diagnosis not present

## 2013-09-07 DIAGNOSIS — E1149 Type 2 diabetes mellitus with other diabetic neurological complication: Secondary | ICD-10-CM | POA: Diagnosis not present

## 2013-09-07 DIAGNOSIS — E7800 Pure hypercholesterolemia, unspecified: Secondary | ICD-10-CM | POA: Diagnosis not present

## 2013-09-07 DIAGNOSIS — I6350 Cerebral infarction due to unspecified occlusion or stenosis of unspecified cerebral artery: Secondary | ICD-10-CM | POA: Diagnosis not present

## 2013-09-07 DIAGNOSIS — I10 Essential (primary) hypertension: Secondary | ICD-10-CM | POA: Diagnosis not present

## 2013-09-28 DIAGNOSIS — M8440XA Pathological fracture, unspecified site, initial encounter for fracture: Secondary | ICD-10-CM | POA: Diagnosis not present

## 2013-09-28 DIAGNOSIS — IMO0002 Reserved for concepts with insufficient information to code with codable children: Secondary | ICD-10-CM | POA: Diagnosis not present

## 2013-09-28 DIAGNOSIS — M069 Rheumatoid arthritis, unspecified: Secondary | ICD-10-CM | POA: Diagnosis not present

## 2013-09-28 DIAGNOSIS — M1710 Unilateral primary osteoarthritis, unspecified knee: Secondary | ICD-10-CM | POA: Diagnosis not present

## 2013-10-06 ENCOUNTER — Ambulatory Visit (INDEPENDENT_AMBULATORY_CARE_PROVIDER_SITE_OTHER): Payer: Medicare Other

## 2013-10-06 ENCOUNTER — Ambulatory Visit: Payer: Self-pay

## 2013-10-06 ENCOUNTER — Telehealth: Payer: Self-pay | Admitting: *Deleted

## 2013-10-06 VITALS — BP 136/76 | HR 100 | Temp 97.3°F | Resp 16

## 2013-10-06 DIAGNOSIS — L03039 Cellulitis of unspecified toe: Secondary | ICD-10-CM | POA: Diagnosis not present

## 2013-10-06 DIAGNOSIS — M069 Rheumatoid arthritis, unspecified: Secondary | ICD-10-CM

## 2013-10-06 DIAGNOSIS — Z9889 Other specified postprocedural states: Secondary | ICD-10-CM

## 2013-10-06 DIAGNOSIS — L02619 Cutaneous abscess of unspecified foot: Secondary | ICD-10-CM

## 2013-10-06 DIAGNOSIS — M2010 Hallux valgus (acquired), unspecified foot: Secondary | ICD-10-CM | POA: Diagnosis not present

## 2013-10-06 DIAGNOSIS — M2040 Other hammer toe(s) (acquired), unspecified foot: Secondary | ICD-10-CM | POA: Diagnosis not present

## 2013-10-06 DIAGNOSIS — L97509 Non-pressure chronic ulcer of other part of unspecified foot with unspecified severity: Secondary | ICD-10-CM

## 2013-10-06 MED ORDER — CEPHALEXIN 500 MG PO CAPS: 500.0000 mg | ORAL_CAPSULE | Freq: Three times a day (TID) | ORAL | Status: DC

## 2013-10-06 MED ORDER — CIPROFLOXACIN HCL 500 MG PO TABS: 500.0000 mg | ORAL_TABLET | Freq: Two times a day (BID) | ORAL | Status: DC

## 2013-10-06 NOTE — Addendum Note (Signed)
Addended by: Lolita Rieger on: 10/06/2013 12:24 PM   Modules accepted: Orders

## 2013-10-06 NOTE — Progress Notes (Signed)
   Subjective:    Patient ID: [name], female    DOB: 11/16/1928, 80 y.o.   MRN: [id number]  HPI Comments: "I have an open sore on my toe"  Patient states that she has a new open would on her 2nd toe-medial side right foot. States that it has been like this for about 3 days. The toe is not painful, but is red, swollen and has slight drainage. She has been using polysporin and bandaging. States that it looks a little better than it did.     Review of Systems  Constitutional: Positive for chills.  Gastrointestinal: Positive for diarrhea.  Musculoskeletal: Positive for arthralgias and gait problem.  Skin:       Open sore   Neurological: Positive for tremors and weakness.  All other systems reviewed and are negative.       Objective:   Physical Exam Vascular status is intact although diminished thready pedal pulses DP and PT plus one over 4 bilateral. Patient severe rheumatoid arthropathy with severe HAV deformity and hammertoe deformities bilateral feet history of ulceration second left and the past. Currently has active ulceration localized cellulitis medial second digit right foot distal IP joint. The joint and bone are exposed within the wound and debrided away at this time. Deep cultures also obtained at this time. Patient is a Darco shoe at home and will use an open toe Darco shoe for ambulation at home walks with the assistance of a cane due to her rheumatoid at this time. Patient was called in prescriptions for cephalexin and Cipro cephalexin 3 times a day Cipro twice a day x10 days. Should note suspect at this time acute versus chronic osteomyelitis this second digit distal and middle phalanges. As bone is exposed in the wound.       Assessment & Plan:  Assessment this time ulceration and localized cellulitis secondary to rheumatoid arthropathy. There is some vascular compromise cannot rule out osteophytes or bone infection as bone is exposed in the wound. The stem was  debrided Iodosorb and a gauze dressing are applied. Initiate daily cleansing with Betadine warm water Silvadene and gauze dressing reapplied and prescriptions for Cipro cephalexin:. Reappointed one week for followup may be candidate for partial toe amputation the future we'll continue to monitor. X-rays do confirm significant osteopenic changes of all the phalanges cannot rule out osteomyelitis. Will submit culture and sensitivity for review and maintain antibiotics is appropriate thereafter. Recheck in one week for followup patient contact us immediately if there is any increase in symptoms fever chills or increased pain.  Harriet Masson DPM

## 2013-10-06 NOTE — Patient Instructions (Signed)
Betadine Soak Instructions  Purchase an 8 oz. bottle of BETADINE solution (Povidone)  THE DAY AFTER THE PROCEDURE  Place 1 tablespoon of betadine solution in a quart of warm tap water.  Submerge your foot or feet with outer bandage intact for the initial soak; this will allow the bandage to become moist and wet for easy lift off.  Once you remove your bandage, continue to soak in the solution for 20 minutes.  This soak should be done twice a day.  Next, remove your foot or feet from solution, blot dry the affected area and cover.  You may use a band aid large enough to cover the area or use gauze and tape.  Apply other medications to the area as directed by the doctor such as cortisporin otic solution (ear drops) or neosporin.  IF YOUR SKIN BECOMES IRRITATED WHILE USING THESE INSTRUCTIONS, IT IS OKAY TO SWITCH TO EPSOM SALTS AND WATER OR WHITE VINEGAR AND WATER. After doing soaks daily apply Silvadene cream and gauze dressing to second toe right foot. Maintain dressing changes every day for the next 2-3 weeks until instructed to discontinue.

## 2013-10-06 NOTE — Telephone Encounter (Signed)
Patient called inquiring if she was supposed to have 2 prescriptions.  She stated she picked up the Cipro but as she was leaving the pharmacist said another one was coming across.  I informed her per Dr. Blenda Mounts that it is supposed to be 2 prescriptions.  She asked the name of the other prescription.  I told her Cephalexin.  I told her I also informed [employer], I spoke to [place].

## 2013-10-08 DIAGNOSIS — M48061 Spinal stenosis, lumbar region without neurogenic claudication: Secondary | ICD-10-CM | POA: Diagnosis not present

## 2013-10-12 ENCOUNTER — Ambulatory Visit: Payer: Medicare Other

## 2013-10-14 ENCOUNTER — Ambulatory Visit (INDEPENDENT_AMBULATORY_CARE_PROVIDER_SITE_OTHER): Payer: Medicare Other

## 2013-10-14 VITALS — BP 107/56 | HR 63 | Resp 15 | Ht 64.0 in | Wt 122.0 lb

## 2013-10-14 DIAGNOSIS — L97509 Non-pressure chronic ulcer of other part of unspecified foot with unspecified severity: Secondary | ICD-10-CM

## 2013-10-14 DIAGNOSIS — L02619 Cutaneous abscess of unspecified foot: Secondary | ICD-10-CM

## 2013-10-14 DIAGNOSIS — L03039 Cellulitis of unspecified toe: Secondary | ICD-10-CM

## 2013-10-14 DIAGNOSIS — E1149 Type 2 diabetes mellitus with other diabetic neurological complication: Secondary | ICD-10-CM

## 2013-10-14 DIAGNOSIS — M069 Rheumatoid arthritis, unspecified: Secondary | ICD-10-CM | POA: Diagnosis not present

## 2013-10-14 DIAGNOSIS — E1140 Type 2 diabetes mellitus with diabetic neuropathy, unspecified: Secondary | ICD-10-CM

## 2013-10-14 DIAGNOSIS — E1142 Type 2 diabetes mellitus with diabetic polyneuropathy: Secondary | ICD-10-CM | POA: Diagnosis not present

## 2013-10-14 DIAGNOSIS — L608 Other nail disorders: Secondary | ICD-10-CM | POA: Diagnosis not present

## 2013-10-14 DIAGNOSIS — Q828 Other specified congenital malformations of skin: Secondary | ICD-10-CM

## 2013-10-14 NOTE — Progress Notes (Signed)
   Subjective:    Patient ID: [name], female    DOB: 1929-08-13, 80 y.o.   MRN: [id number] Pt states the right 2nd toe looks better, but wants the left foot to be checked. HPI    Review of Systems no new changes or findings     Objective:   Physical Exam Vascular status is intact with pedal pulses DP and PT plus one over 4 bilateral thready but present there is severe arthropathy with rheumatoid deviation digits hallux right with underwent a the second digit patient had amputation second toe left D. to digital contractures hammertoe deformity and rheumatoid patient does have thick friable discolored brittle nails 1 through 5 right 134 and 5 left. Patient does have ulceration medial IP joint second digit right foot secondary to hammertoe deformity and bunion deformity the ulcer site is debrided down to dermal level Silvadene and Band-Aid dressing tube foam padding are applied no other changes medication her health status again profound diabetic neuropathy with decreased epicritic and proprioceptive sensations on [employer] testing. Maintain accommodative shoes as instructed       Assessment & Plan:  Assessment diabetes with peripheral neuropathy ulceration second toe right resolving best to complete her antibiotic regimen maintain tube foam padding also Silvadene and gauze dressing for leaks [place] for 5 days until resolved. Recheck in 2 months for long-term followup this time the nails thick brittle crumbly friable ingrowing nails debrided 1 through 5 right 134 and 5 left followup for palliative nail care and foot checked in 2 months for continued diabetic foot care maintain appropriate coming shoes at all times  Harriet Masson DPM

## 2013-10-14 NOTE — Patient Instructions (Signed)
ANTIBACTERIAL SOAP INSTRUCTIONS  THE DAY AFTER PROCEDURE  Please follow the instructions your doctor has marked.   Shower as usual. Before getting out, place a drop of antibacterial liquid soap (Dial) on a wet, clean washcloth.  Gently wipe washcloth over affected area.  Afterward, rinse the area with warm water.  Blot the area dry with a soft cloth and cover with antibiotic ointment (neosporin, polysporin, bacitracin) and band aid or gauze and tape  Place 3-4 drops of antibacterial liquid soap in a quart of warm tap water.  Submerge foot into water for 20 minutes.  If bandage was applied after your procedure, leave on to allow for easy lift off, then remove and continue with soak for the remaining time.  Next, blot area dry with a soft cloth and cover with a bandage.  Apply other medications as directed by your doctor, such as cortisporin otic solution (eardrops) or neosporin antibiotic ointment clean the foot daily continue to apply Silvadene and gauze dressing are Band-Aid dressing second toe right foot for at least one more week to maintain the tube pads to the toe at all times.

## 2013-10-23 DIAGNOSIS — H52209 Unspecified astigmatism, unspecified eye: Secondary | ICD-10-CM | POA: Diagnosis not present

## 2013-10-23 DIAGNOSIS — H2510 Age-related nuclear cataract, unspecified eye: Secondary | ICD-10-CM | POA: Diagnosis not present

## 2013-10-23 DIAGNOSIS — E119 Type 2 diabetes mellitus without complications: Secondary | ICD-10-CM | POA: Diagnosis not present

## 2013-10-23 DIAGNOSIS — H25049 Posterior subcapsular polar age-related cataract, unspecified eye: Secondary | ICD-10-CM | POA: Diagnosis not present

## 2013-10-26 DIAGNOSIS — M48061 Spinal stenosis, lumbar region without neurogenic claudication: Secondary | ICD-10-CM | POA: Diagnosis not present

## 2013-10-30 DIAGNOSIS — M48061 Spinal stenosis, lumbar region without neurogenic claudication: Secondary | ICD-10-CM | POA: Diagnosis not present

## 2013-11-02 ENCOUNTER — Telehealth: Payer: Self-pay | Admitting: *Deleted

## 2013-11-02 DIAGNOSIS — M48061 Spinal stenosis, lumbar region without neurogenic claudication: Secondary | ICD-10-CM | POA: Diagnosis not present

## 2013-11-02 NOTE — Telephone Encounter (Signed)
Pt states continues to have redness in the hammer toe, does Dr Blenda Mounts want her to take the antibiotics again?  I will ask Dr Blenda Mounts and call the pt.

## 2013-11-03 ENCOUNTER — Ambulatory Visit (INDEPENDENT_AMBULATORY_CARE_PROVIDER_SITE_OTHER): Payer: Medicare Other

## 2013-11-03 VITALS — BP 128/83 | HR 105 | Resp 16

## 2013-11-03 DIAGNOSIS — E1149 Type 2 diabetes mellitus with other diabetic neurological complication: Secondary | ICD-10-CM

## 2013-11-03 DIAGNOSIS — L97509 Non-pressure chronic ulcer of other part of unspecified foot with unspecified severity: Secondary | ICD-10-CM | POA: Diagnosis not present

## 2013-11-03 DIAGNOSIS — M2040 Other hammer toe(s) (acquired), unspecified foot: Secondary | ICD-10-CM | POA: Diagnosis not present

## 2013-11-03 DIAGNOSIS — E1142 Type 2 diabetes mellitus with diabetic polyneuropathy: Secondary | ICD-10-CM

## 2013-11-03 DIAGNOSIS — E1140 Type 2 diabetes mellitus with diabetic neuropathy, unspecified: Secondary | ICD-10-CM

## 2013-11-03 NOTE — Patient Instructions (Signed)

## 2013-11-03 NOTE — Telephone Encounter (Signed)
Patient needs to be seen for followup visit if there is any discharge or drainage may consider putting back on antibiotic otherwise continue topical Silvadene gauze dressing changes. [place] for followup he with myself or one of the other doctors some time this week  Dr. Blenda Mounts

## 2013-11-03 NOTE — Telephone Encounter (Signed)
Left message to continue the care as before and expect a call from a scheduler to set up earlier appt.

## 2013-11-03 NOTE — Progress Notes (Signed)
   Subjective:    Patient ID: JENNETTA FLOOD, female    DOB: Feb 05, 1929, 80 y.o.   MRN: [id number]  HPI Comments: "This toe is still red. It looks better, but I want it checked just in case"    Toe Pain       Review of Systems no new changes or significant findings toe slightly red second toe right however there is no increased temperature no fever chills     Objective:   Physical Exam Okay objective findings as follows pedal pulses DP and PT plus one over 4 bilateral Refill timed 3-4 seconds all digits patient severe rheumatoid with rigid digital contractures and the second digit overlapping the hallux on the right foot the medial surfaces second digit over the distal IP joint still shows about 2-3 mm ulceration down to dermal junction does not go full thickness at this time there is mild bloody discharge drainage with dry eschar been present no active discharge or drainage there is also some irritation from the hallux nail bed against this area. Patient wearing diabetic extra-depth type shoes as recommended still wearing tube foam padding as recommended there is no ascending cellulitis lymphangitis no increased temperature no purulence noted       Assessment & Plan:  Assessment is improving or resolving ulceration medial second digit distal IP joint right foot. The ulcer site is debrided down and dermal level Silvadene and gauze dressing are applied patient continues Silvadene gauze dressing to either Epson salts Betadine or soap and water soak and uses Silvadene and Band-Aid dressing. Continue with this separate the toes with tube foam padding recheck in 3 weeks for further followup  Harriet Masson DPM

## 2013-11-03 NOTE — Telephone Encounter (Signed)
TRIED TO SCHED PT TO COME IN TODAY; SHE COULDN'T, WAITING TO SPEAK TO ASHLEY TO LOOK AT HIS SCHEDULE TOMORROW TO SEE IF WE CAN GET HER WORKED IN

## 2013-11-04 DIAGNOSIS — M48061 Spinal stenosis, lumbar region without neurogenic claudication: Secondary | ICD-10-CM | POA: Diagnosis not present

## 2013-11-13 ENCOUNTER — Ambulatory Visit: Payer: Medicare Other

## 2013-11-18 LAB — AFB CULTURE WITH SMEAR (NOT AT ARMC): ACID FAST SMEAR: NONE SEEN

## 2013-11-25 ENCOUNTER — Ambulatory Visit: Payer: Medicare Other

## 2013-11-26 DIAGNOSIS — H52209 Unspecified astigmatism, unspecified eye: Secondary | ICD-10-CM | POA: Diagnosis not present

## 2013-11-26 DIAGNOSIS — H2589 Other age-related cataract: Secondary | ICD-10-CM | POA: Diagnosis not present

## 2013-11-26 DIAGNOSIS — H2510 Age-related nuclear cataract, unspecified eye: Secondary | ICD-10-CM | POA: Diagnosis not present

## 2013-11-26 DIAGNOSIS — H25019 Cortical age-related cataract, unspecified eye: Secondary | ICD-10-CM | POA: Diagnosis not present

## 2013-12-02 ENCOUNTER — Ambulatory Visit: Payer: Medicare Other

## 2013-12-04 ENCOUNTER — Telehealth: Payer: Self-pay | Admitting: *Deleted

## 2013-12-04 MED ORDER — SILVER SULFADIAZINE 1 % EX CREA: 1.0000 "application " | TOPICAL_CREAM | Freq: Every day | CUTANEOUS | Status: DC

## 2013-12-04 NOTE — Telephone Encounter (Signed)
[facility] faxed request for Silvadene Cream 1% Cream 50 gm.  Dr Blenda Mounts states refill with 2 additional.  Ordered electronically.

## 2013-12-09 DIAGNOSIS — Z0000 Encounter for general adult medical examination without abnormal findings: Secondary | ICD-10-CM | POA: Diagnosis not present

## 2013-12-09 DIAGNOSIS — M2170 Unequal limb length (acquired), unspecified site: Secondary | ICD-10-CM | POA: Diagnosis not present

## 2013-12-09 DIAGNOSIS — E7800 Pure hypercholesterolemia, unspecified: Secondary | ICD-10-CM | POA: Diagnosis not present

## 2013-12-09 DIAGNOSIS — E1149 Type 2 diabetes mellitus with other diabetic neurological complication: Secondary | ICD-10-CM | POA: Diagnosis not present

## 2013-12-09 DIAGNOSIS — R634 Abnormal weight loss: Secondary | ICD-10-CM | POA: Diagnosis not present

## 2013-12-18 ENCOUNTER — Other Ambulatory Visit: Payer: Self-pay

## 2013-12-18 ENCOUNTER — Ambulatory Visit (INDEPENDENT_AMBULATORY_CARE_PROVIDER_SITE_OTHER): Payer: Medicare Other

## 2013-12-18 VITALS — BP 113/83 | HR 98 | Resp 16

## 2013-12-18 DIAGNOSIS — M2040 Other hammer toe(s) (acquired), unspecified foot: Secondary | ICD-10-CM | POA: Diagnosis not present

## 2013-12-18 DIAGNOSIS — L02619 Cutaneous abscess of unspecified foot: Secondary | ICD-10-CM | POA: Diagnosis not present

## 2013-12-18 DIAGNOSIS — M069 Rheumatoid arthritis, unspecified: Secondary | ICD-10-CM

## 2013-12-18 DIAGNOSIS — E1149 Type 2 diabetes mellitus with other diabetic neurological complication: Secondary | ICD-10-CM

## 2013-12-18 DIAGNOSIS — L97509 Non-pressure chronic ulcer of other part of unspecified foot with unspecified severity: Secondary | ICD-10-CM

## 2013-12-18 DIAGNOSIS — L03119 Cellulitis of unspecified part of limb: Principal | ICD-10-CM

## 2013-12-18 DIAGNOSIS — E1140 Type 2 diabetes mellitus with diabetic neuropathy, unspecified: Secondary | ICD-10-CM

## 2013-12-18 DIAGNOSIS — E1142 Type 2 diabetes mellitus with diabetic polyneuropathy: Secondary | ICD-10-CM

## 2013-12-18 MED ORDER — CEPHALEXIN 500 MG PO CAPS: 500.0000 mg | ORAL_CAPSULE | Freq: Three times a day (TID) | ORAL | Status: DC

## 2013-12-18 NOTE — Patient Instructions (Signed)
ANTIBACTERIAL SOAP INSTRUCTIONS  THE DAY AFTER PROCEDURE  Please follow the instructions your doctor has marked.   Shower as usual. Before getting out, place a drop of antibacterial liquid soap (Dial) on a wet, clean washcloth.  Gently wipe washcloth over affected area.  Afterward, rinse the area with warm water.  Blot the area dry with a soft cloth and cover with antibiotic ointment (neosporin, polysporin, bacitracin) and band aid or gauze and tape  Place 3-4 drops of antibacterial liquid soap in a quart of warm tap water.  Submerge foot into water for 20 minutes.  If bandage was applied after your procedure, leave on to allow for easy lift off, then remove and continue with soak for the remaining time.  Next, blot area dry with a soft cloth and cover with a bandage. After washing and drying thoroughly applying Silvadene cream and gauze dressing daily maintain clean dressings once daily as instructed call the office if there is any increase in redness fever or drainage

## 2013-12-18 NOTE — Progress Notes (Signed)
   Subjective:    Patient ID: [name], female    DOB: 07-24-1929, 80 y.o.   MRN: [id number]  HPI Comments: "It's still humped and gets red"  Toe ulcer - Follow up 2nd toe right      Review of Systems no new changes or findings     Objective:   Physical Exam Patient presents for followup was last seen February at that time for ulceration of second digit IP joint right foot that appears well slight erythema noted patient also is bunion of the right foot which is erythematous to foam padding applied to both of those at this time. Neurovascular status otherwise intact and unchanged patient these have severe rheumatoid arthropathy has been amputated second toe left foot are at this time has painful keratotic sub-first MTP area left which on debridement reveals underlying abscess with purulent discharge or drainage which was cultured at this time the abscess is debrided cleansed with all cleansed Iodosorb and gauze dressing are applied. Patient dressings applied patient instructed in daily cleansing with soap and water and Silvadene gauze dressing application chart he has Silvadene at home a prescription for cephalexin was furnished her pharmacy at this time. There is no ascending cellulitis no lymphangitis no fever chills patient is localized abscess sub-first MTP area down to the dermal subdermal junction. The abscess approximately a centimeter by 2 cm in diameter.       Assessment & Plan:  Assessment abscess/ulceration secondary to rheumatoid arthropathy and plantar flexed metatarsals and atrophy. The abscess was I&D at this time patient placed on cephalexin 500 3 times a day x10 days also Silvadene and gauze dressing daily contacted in changes or exacerbations any fever chills were to occur to the emergency room immediately. Recheck in 2 weeks for followup maintain dressings as instructed coming shoes as instructed  Harriet Masson DPM

## 2013-12-18 NOTE — Addendum Note (Signed)
Addended by: Lolita Rieger on: 12/18/2013 04:05 PM   Modules accepted: Orders

## 2013-12-21 DIAGNOSIS — E7800 Pure hypercholesterolemia, unspecified: Secondary | ICD-10-CM | POA: Diagnosis not present

## 2013-12-21 DIAGNOSIS — I10 Essential (primary) hypertension: Secondary | ICD-10-CM | POA: Diagnosis not present

## 2013-12-21 DIAGNOSIS — E1149 Type 2 diabetes mellitus with other diabetic neurological complication: Secondary | ICD-10-CM | POA: Diagnosis not present

## 2013-12-21 DIAGNOSIS — I6350 Cerebral infarction due to unspecified occlusion or stenosis of unspecified cerebral artery: Secondary | ICD-10-CM | POA: Diagnosis not present

## 2013-12-21 LAB — WOUND CULTURE
GRAM STAIN: NONE SEEN
ORGANISM ID, BACTERIA: NO GROWTH

## 2013-12-30 DIAGNOSIS — H9209 Otalgia, unspecified ear: Secondary | ICD-10-CM | POA: Diagnosis not present

## 2013-12-30 DIAGNOSIS — H6120 Impacted cerumen, unspecified ear: Secondary | ICD-10-CM | POA: Diagnosis not present

## 2014-01-01 ENCOUNTER — Ambulatory Visit: Payer: Medicare Other

## 2014-01-04 ENCOUNTER — Ambulatory Visit (INDEPENDENT_AMBULATORY_CARE_PROVIDER_SITE_OTHER): Payer: Medicare Other

## 2014-01-04 VITALS — BP 111/66 | HR 95 | Resp 18

## 2014-01-04 DIAGNOSIS — M069 Rheumatoid arthritis, unspecified: Secondary | ICD-10-CM

## 2014-01-04 DIAGNOSIS — Q828 Other specified congenital malformations of skin: Secondary | ICD-10-CM

## 2014-01-04 DIAGNOSIS — L97509 Non-pressure chronic ulcer of other part of unspecified foot with unspecified severity: Secondary | ICD-10-CM

## 2014-01-04 DIAGNOSIS — E1142 Type 2 diabetes mellitus with diabetic polyneuropathy: Secondary | ICD-10-CM

## 2014-01-04 DIAGNOSIS — E1149 Type 2 diabetes mellitus with other diabetic neurological complication: Secondary | ICD-10-CM | POA: Diagnosis not present

## 2014-01-04 DIAGNOSIS — E1140 Type 2 diabetes mellitus with diabetic neuropathy, unspecified: Secondary | ICD-10-CM

## 2014-01-04 NOTE — Patient Instructions (Signed)
Diabetes and Foot Care Diabetes may cause you to have problems because of poor blood supply (circulation) to your feet and legs. This may cause the skin on your feet to become thinner, break easier, and heal more slowly. Your skin may become dry, and the skin may peel and crack. You may also have nerve damage in your legs and feet causing decreased feeling in them. You may not notice minor injuries to your feet that could lead to infections or more serious problems. Taking care of your feet is one of the most important things you can do for yourself.  HOME CARE INSTRUCTIONS  Wear shoes at all times, even in the house. Do not go barefoot. Bare feet are easily injured.  Check your feet daily for blisters, cuts, and redness. If you cannot see the bottom of your feet, use a mirror or ask someone for help.  Wash your feet with warm water (do not use hot water) and mild soap. Then pat your feet and the areas between your toes until they are completely dry. Do not soak your feet as this can dry your skin.  Apply a moisturizing lotion or petroleum jelly (that does not contain alcohol and is unscented) to the skin on your feet and to dry, brittle toenails. Do not apply lotion between your toes.  Trim your toenails straight across. Do not dig under them or around the cuticle. File the edges of your nails with an emery board or nail file.  Do not cut corns or calluses or try to remove them with medicine.  Wear clean socks or stockings every day. Make sure they are not too tight. Do not wear knee-high stockings since they may decrease blood flow to your legs.  Wear shoes that fit properly and have enough cushioning. To break in new shoes, wear them for just a few hours a day. This prevents you from injuring your feet. Always look in your shoes before you put them on to be sure there are no objects inside.  Do not cross your legs. This may decrease the blood flow to your feet.  If you find a minor scrape,  cut, or break in the skin on your feet, keep it and the skin around it clean and dry. These areas may be cleansed with mild soap and water. Do not cleanse the area with peroxide, alcohol, or iodine.  When you remove an adhesive bandage, be sure not to damage the skin around it.  If you have a wound, look at it several times a day to make sure it is healing.  Do not use heating pads or hot water bottles. They may burn your skin. If you have lost feeling in your feet or legs, you may not know it is happening until it is too late.  Make sure your health care provider performs a complete foot exam at least annually or more often if you have foot problems. Report any cuts, sores, or bruises to your health care provider immediately. SEEK MEDICAL CARE IF:   You have an injury that is not healing.  You have cuts or breaks in the skin.  You have an ingrown nail.  You notice redness on your legs or feet.  You feel burning or tingling in your legs or feet.  You have pain or cramps in your legs and feet.  Your legs or feet are numb.  Your feet always feel cold. SEEK IMMEDIATE MEDICAL CARE IF:   There is increasing redness,   swelling, or pain in or around a wound.  There is a red line that goes up your leg.  Pus is coming from a wound.  You develop a fever or as directed by your health care provider.  You notice a bad smell coming from an ulcer or wound. Document Released: 09/07/2000 Document Revised: 05/13/2013 Document Reviewed: 02/17/2013 Exit[employer] Patient Information 2014 [employer].  

## 2014-01-04 NOTE — Progress Notes (Signed)
   Subjective:    Patient ID: [name], female    DOB: Sep 21, 1929, 80 y.o.   MRN: [id number]  HPI some are better and some are not and they are red and the bunions are swollen some    Review of Systems no new systemic changes or findings are noted     Objective:   Physical Exam Lower extremity objective findings as follows no other significant changes patient does have ulceration sub-first MTP area left foot there appears to be resolved there is some slight eschar tissue which or debridement reveals a hemorrhage a keratoses down to dermal level sub-first MTP her left and right foot also keratoses which is debrided. Nails also somewhat criptotic many debridement with the next month or so. Patient does have diabetes with complications peripheral neuropathy as well as severe subluxations and dislocations severe HAV deformity and hammertoe deformities due to rheumatoid arthritis affecting left foot more so than right       Assessment & Plan:  Ulcer sub-1 left is debrided down and dermal level only slight pinpoint is been treated with lumicain Silvadene and Band-Aid dressing tube foam padding was dispensed a cushioned bunion areas patient continues to get some erythema the severe subluxation first MTP joints bilateral wearing diabetic extra-depth shoes are some home sleeves are dispensed a cushioned bunion areas bilateral reappointed to 6 weeks for diabetic foot and nail care as needed and followup.  Harriet Masson DPM

## 2014-01-08 DIAGNOSIS — M48061 Spinal stenosis, lumbar region without neurogenic claudication: Secondary | ICD-10-CM | POA: Diagnosis not present

## 2014-01-08 DIAGNOSIS — IMO0002 Reserved for concepts with insufficient information to code with codable children: Secondary | ICD-10-CM | POA: Diagnosis not present

## 2014-01-08 DIAGNOSIS — M549 Dorsalgia, unspecified: Secondary | ICD-10-CM | POA: Diagnosis not present

## 2014-01-08 DIAGNOSIS — M25569 Pain in unspecified knee: Secondary | ICD-10-CM | POA: Diagnosis not present

## 2014-01-08 DIAGNOSIS — M1710 Unilateral primary osteoarthritis, unspecified knee: Secondary | ICD-10-CM | POA: Diagnosis not present

## 2014-01-18 DIAGNOSIS — M549 Dorsalgia, unspecified: Secondary | ICD-10-CM | POA: Diagnosis not present

## 2014-01-21 DIAGNOSIS — E1149 Type 2 diabetes mellitus with other diabetic neurological complication: Secondary | ICD-10-CM | POA: Diagnosis not present

## 2014-01-21 DIAGNOSIS — E7800 Pure hypercholesterolemia, unspecified: Secondary | ICD-10-CM | POA: Diagnosis not present

## 2014-01-21 DIAGNOSIS — I10 Essential (primary) hypertension: Secondary | ICD-10-CM | POA: Diagnosis not present

## 2014-01-25 DIAGNOSIS — M48061 Spinal stenosis, lumbar region without neurogenic claudication: Secondary | ICD-10-CM | POA: Diagnosis not present

## 2014-02-01 DIAGNOSIS — M48061 Spinal stenosis, lumbar region without neurogenic claudication: Secondary | ICD-10-CM | POA: Diagnosis not present

## 2014-02-01 DIAGNOSIS — M25569 Pain in unspecified knee: Secondary | ICD-10-CM | POA: Diagnosis not present

## 2014-02-03 DIAGNOSIS — M48061 Spinal stenosis, lumbar region without neurogenic claudication: Secondary | ICD-10-CM | POA: Diagnosis not present

## 2014-02-03 DIAGNOSIS — M25569 Pain in unspecified knee: Secondary | ICD-10-CM | POA: Diagnosis not present

## 2014-02-09 DIAGNOSIS — M25569 Pain in unspecified knee: Secondary | ICD-10-CM | POA: Diagnosis not present

## 2014-02-10 ENCOUNTER — Ambulatory Visit (INDEPENDENT_AMBULATORY_CARE_PROVIDER_SITE_OTHER): Payer: Medicare Other

## 2014-02-10 VITALS — BP 118/65 | HR 94 | Resp 18

## 2014-02-10 DIAGNOSIS — E1140 Type 2 diabetes mellitus with diabetic neuropathy, unspecified: Secondary | ICD-10-CM

## 2014-02-10 DIAGNOSIS — E1149 Type 2 diabetes mellitus with other diabetic neurological complication: Secondary | ICD-10-CM

## 2014-02-10 DIAGNOSIS — L608 Other nail disorders: Secondary | ICD-10-CM | POA: Diagnosis not present

## 2014-02-10 DIAGNOSIS — M069 Rheumatoid arthritis, unspecified: Secondary | ICD-10-CM

## 2014-02-10 DIAGNOSIS — E1142 Type 2 diabetes mellitus with diabetic polyneuropathy: Secondary | ICD-10-CM | POA: Diagnosis not present

## 2014-02-10 DIAGNOSIS — Q828 Other specified congenital malformations of skin: Secondary | ICD-10-CM

## 2014-02-10 NOTE — Progress Notes (Signed)
   Subjective:    Patient ID: [name], female    DOB: 1929/05/29, 80 y.o.   MRN: [id number]  HPI I have a few red spots on my feet and I have some draining on both my feet on the bunion area    Review of Systems no new findings or systemic changes noted    Objective:   Physical Exam Neurovascular status is intact and unchanged pedal pulses are palpable with thready DP and PT plus one over 4 bilateral ulceration sub-first left his result however there is hemorrhage a keratoses over the first MTP joint dorsal medial and plantar sub-sesamoid region there is also keratoses of the right hallux first MTP joint as well the second digit and distal clavus fourth left patient had amputated second toe left is a history of diabetes complications history of ulceration which is improved are stable at this time. Also significant rheumatoid arthropathy and deformity absent epicritic sensation is on [employer] testing consistent with diabetes and neuropathy       Assessment & Plan:  Assessment ulcers resolve this time debridement of multiple dystrophic probably orthotic nails bring the presence of diabetes and complications also debridement multiple keratoses multiple tube foam pads are dispensed for the hallux and lesser digits help cushion areas and prevent further keratoses of the left maintain diabetic shoes at all times and custom molded insoles as she currently has reappointed 2 months to 3 months for continued followup and palliative diabetic foot and nail care next  Harriet Masson DPM

## 2014-02-10 NOTE — Patient Instructions (Signed)
Diabetes and Foot Care Diabetes may cause you to have problems because of poor blood supply (circulation) to your feet and legs. This may cause the skin on your feet to become thinner, break easier, and heal more slowly. Your skin may become dry, and the skin may peel and crack. You may also have nerve damage in your legs and feet causing decreased feeling in them. You may not notice minor injuries to your feet that could lead to infections or more serious problems. Taking care of your feet is one of the most important things you can do for yourself.  HOME CARE INSTRUCTIONS  Wear shoes at all times, even in the house. Do not go barefoot. Bare feet are easily injured.  Check your feet daily for blisters, cuts, and redness. If you cannot see the bottom of your feet, use a mirror or ask someone for help.  Wash your feet with warm water (do not use hot water) and mild soap. Then pat your feet and the areas between your toes until they are completely dry. Do not soak your feet as this can dry your skin.  Apply a moisturizing lotion or petroleum jelly (that does not contain alcohol and is unscented) to the skin on your feet and to dry, brittle toenails. Do not apply lotion between your toes.  Trim your toenails straight across. Do not dig under them or around the cuticle. File the edges of your nails with an emery board or nail file.  Do not cut corns or calluses or try to remove them with medicine.  Wear clean socks or stockings every day. Make sure they are not too tight. Do not wear knee-high stockings since they may decrease blood flow to your legs.  Wear shoes that fit properly and have enough cushioning. To break in new shoes, wear them for just a few hours a day. This prevents you from injuring your feet. Always look in your shoes before you put them on to be sure there are no objects inside.  Do not cross your legs. This may decrease the blood flow to your feet.  If you find a minor scrape,  cut, or break in the skin on your feet, keep it and the skin around it clean and dry. These areas may be cleansed with mild soap and water. Do not cleanse the area with peroxide, alcohol, or iodine.  When you remove an adhesive bandage, be sure not to damage the skin around it.  If you have a wound, look at it several times a day to make sure it is healing.  Do not use heating pads or hot water bottles. They may burn your skin. If you have lost feeling in your feet or legs, you may not know it is happening until it is too late.  Make sure your health care provider performs a complete foot exam at least annually or more often if you have foot problems. Report any cuts, sores, or bruises to your health care provider immediately. SEEK MEDICAL CARE IF:   You have an injury that is not healing.  You have cuts or breaks in the skin.  You have an ingrown nail.  You notice redness on your legs or feet.  You feel burning or tingling in your legs or feet.  You have pain or cramps in your legs and feet.  Your legs or feet are numb.  Your feet always feel cold. SEEK IMMEDIATE MEDICAL CARE IF:   There is increasing redness,   swelling, or pain in or around a wound.  There is a red line that goes up your leg.  Pus is coming from a wound.  You develop a fever or as directed by your health care provider.  You notice a bad smell coming from an ulcer or wound. Document Released: 09/07/2000 Document Revised: 05/13/2013 Document Reviewed: 02/17/2013 Exit[employer] Patient Information 2014 [employer].  

## 2014-02-11 DIAGNOSIS — M48061 Spinal stenosis, lumbar region without neurogenic claudication: Secondary | ICD-10-CM | POA: Diagnosis not present

## 2014-02-16 DIAGNOSIS — M25569 Pain in unspecified knee: Secondary | ICD-10-CM | POA: Diagnosis not present

## 2014-02-16 DIAGNOSIS — M48061 Spinal stenosis, lumbar region without neurogenic claudication: Secondary | ICD-10-CM | POA: Diagnosis not present

## 2014-02-18 DIAGNOSIS — M48061 Spinal stenosis, lumbar region without neurogenic claudication: Secondary | ICD-10-CM | POA: Diagnosis not present

## 2014-02-18 DIAGNOSIS — M25569 Pain in unspecified knee: Secondary | ICD-10-CM | POA: Diagnosis not present

## 2014-02-19 DIAGNOSIS — E7800 Pure hypercholesterolemia, unspecified: Secondary | ICD-10-CM | POA: Diagnosis not present

## 2014-02-19 DIAGNOSIS — E1149 Type 2 diabetes mellitus with other diabetic neurological complication: Secondary | ICD-10-CM | POA: Diagnosis not present

## 2014-02-22 DIAGNOSIS — E1149 Type 2 diabetes mellitus with other diabetic neurological complication: Secondary | ICD-10-CM | POA: Diagnosis not present

## 2014-02-22 DIAGNOSIS — I10 Essential (primary) hypertension: Secondary | ICD-10-CM | POA: Diagnosis not present

## 2014-02-22 DIAGNOSIS — E7800 Pure hypercholesterolemia, unspecified: Secondary | ICD-10-CM | POA: Diagnosis not present

## 2014-02-26 DIAGNOSIS — M069 Rheumatoid arthritis, unspecified: Secondary | ICD-10-CM | POA: Diagnosis not present

## 2014-02-26 DIAGNOSIS — IMO0002 Reserved for concepts with insufficient information to code with codable children: Secondary | ICD-10-CM | POA: Diagnosis not present

## 2014-02-26 DIAGNOSIS — M1710 Unilateral primary osteoarthritis, unspecified knee: Secondary | ICD-10-CM | POA: Diagnosis not present

## 2014-02-26 DIAGNOSIS — M8440XA Pathological fracture, unspecified site, initial encounter for fracture: Secondary | ICD-10-CM | POA: Diagnosis not present

## 2014-03-05 DIAGNOSIS — M549 Dorsalgia, unspecified: Secondary | ICD-10-CM | POA: Diagnosis not present

## 2014-03-05 DIAGNOSIS — M25569 Pain in unspecified knee: Secondary | ICD-10-CM | POA: Diagnosis not present

## 2014-03-05 DIAGNOSIS — M48061 Spinal stenosis, lumbar region without neurogenic claudication: Secondary | ICD-10-CM | POA: Diagnosis not present

## 2014-03-16 DIAGNOSIS — E1149 Type 2 diabetes mellitus with other diabetic neurological complication: Secondary | ICD-10-CM | POA: Diagnosis not present

## 2014-03-18 DIAGNOSIS — E1149 Type 2 diabetes mellitus with other diabetic neurological complication: Secondary | ICD-10-CM | POA: Diagnosis not present

## 2014-03-18 DIAGNOSIS — I10 Essential (primary) hypertension: Secondary | ICD-10-CM | POA: Diagnosis not present

## 2014-03-18 DIAGNOSIS — E7800 Pure hypercholesterolemia, unspecified: Secondary | ICD-10-CM | POA: Diagnosis not present

## 2014-03-23 DIAGNOSIS — M79609 Pain in unspecified limb: Secondary | ICD-10-CM | POA: Diagnosis not present

## 2014-03-23 DIAGNOSIS — Z96698 Presence of other orthopedic joint implants: Secondary | ICD-10-CM | POA: Diagnosis not present

## 2014-03-23 DIAGNOSIS — M25559 Pain in unspecified hip: Secondary | ICD-10-CM | POA: Diagnosis not present

## 2014-04-14 DIAGNOSIS — M79609 Pain in unspecified limb: Secondary | ICD-10-CM | POA: Diagnosis not present

## 2014-04-14 DIAGNOSIS — M25569 Pain in unspecified knee: Secondary | ICD-10-CM | POA: Diagnosis not present

## 2014-04-14 DIAGNOSIS — M1710 Unilateral primary osteoarthritis, unspecified knee: Secondary | ICD-10-CM | POA: Diagnosis not present

## 2014-05-14 ENCOUNTER — Ambulatory Visit (INDEPENDENT_AMBULATORY_CARE_PROVIDER_SITE_OTHER): Payer: Medicare Other

## 2014-05-14 DIAGNOSIS — E1142 Type 2 diabetes mellitus with diabetic polyneuropathy: Secondary | ICD-10-CM

## 2014-05-14 DIAGNOSIS — Q828 Other specified congenital malformations of skin: Secondary | ICD-10-CM | POA: Diagnosis not present

## 2014-05-14 DIAGNOSIS — M069 Rheumatoid arthritis, unspecified: Secondary | ICD-10-CM

## 2014-05-14 DIAGNOSIS — E1140 Type 2 diabetes mellitus with diabetic neuropathy, unspecified: Secondary | ICD-10-CM

## 2014-05-14 DIAGNOSIS — E1149 Type 2 diabetes mellitus with other diabetic neurological complication: Secondary | ICD-10-CM | POA: Diagnosis not present

## 2014-05-14 DIAGNOSIS — L608 Other nail disorders: Secondary | ICD-10-CM

## 2014-05-14 NOTE — Patient Instructions (Signed)
Diabetes and Foot Care Diabetes may cause you to have problems because of poor blood supply (circulation) to your feet and legs. This may cause the skin on your feet to become thinner, break easier, and heal more slowly. Your skin may become dry, and the skin may peel and crack. You may also have nerve damage in your legs and feet causing decreased feeling in them. You may not notice minor injuries to your feet that could lead to infections or more serious problems. Taking care of your feet is one of the most important things you can do for yourself.  HOME CARE INSTRUCTIONS  Wear shoes at all times, even in the house. Do not go barefoot. Bare feet are easily injured.  Check your feet daily for blisters, cuts, and redness. If you cannot see the bottom of your feet, use a mirror or ask someone for help.  Wash your feet with warm water (do not use hot water) and mild soap. Then pat your feet and the areas between your toes until they are completely dry. Do not soak your feet as this can dry your skin.  Apply a moisturizing lotion or petroleum jelly (that does not contain alcohol and is unscented) to the skin on your feet and to dry, brittle toenails. Do not apply lotion between your toes.  Trim your toenails straight across. Do not dig under them or around the cuticle. File the edges of your nails with an emery board or nail file.  Do not cut corns or calluses or try to remove them with medicine.  Wear clean socks or stockings every day. Make sure they are not too tight. Do not wear knee-high stockings since they may decrease blood flow to your legs.  Wear shoes that fit properly and have enough cushioning. To break in new shoes, wear them for just a few hours a day. This prevents you from injuring your feet. Always look in your shoes before you put them on to be sure there are no objects inside.  Do not cross your legs. This may decrease the blood flow to your feet.  If you find a minor scrape,  cut, or break in the skin on your feet, keep it and the skin around it clean and dry. These areas may be cleansed with mild soap and water. Do not cleanse the area with peroxide, alcohol, or iodine.  When you remove an adhesive bandage, be sure not to damage the skin around it.  If you have a wound, look at it several times a day to make sure it is healing.  Do not use heating pads or hot water bottles. They may burn your skin. If you have lost feeling in your feet or legs, you may not know it is happening until it is too late.  Make sure your health care provider performs a complete foot exam at least annually or more often if you have foot problems. Report any cuts, sores, or bruises to your health care provider immediately. SEEK MEDICAL CARE IF:   You have an injury that is not healing.  You have cuts or breaks in the skin.  You have an ingrown nail.  You notice redness on your legs or feet.  You feel burning or tingling in your legs or feet.  You have pain or cramps in your legs and feet.  Your legs or feet are numb.  Your feet always feel cold. SEEK IMMEDIATE MEDICAL CARE IF:   There is increasing redness,   swelling, or pain in or around a wound.  There is a red line that goes up your leg.  Pus is coming from a wound.  You develop a fever or as directed by your health care provider.  You notice a bad smell coming from an ulcer or wound. Document Released: 09/07/2000 Document Revised: 05/13/2013 Document Reviewed: 02/17/2013 Exit[employer] Patient Information 2015 [employer]. This information is not intended to replace advice given to you by your health care provider. Make sure you discuss any questions you have with your health care provider.  

## 2014-05-14 NOTE — Progress Notes (Signed)
   Subjective:    Patient ID: [name], female    DOB: 1929-05-09, 80 y.o.   MRN: [id number]  HPI Comments: Pt states she has not noticed any drainage from the corns on B/L 2nd hammer toes, and is here to have the toenails x 10 and B/L 2nd hammer toe corns trimmed.     Review of Systems no new findings or systemic changes noted     Objective:   Physical Exam Vascular status is intact thready DP and PT plus one over 4 bilateral there is no active ulcer a pre-ulcer distal lateral fourth digit left foot with hammertoe deformity. There is hemorrhage a keratoses over the first MTP area bilateral and subsecond MTP area left. Cavus foot type with severe rigid digital contractures as well as HAV deformity and overlapping digits. Does have arthropathy with rheumatoid decreased epicritic sensation confirmed on [employer] testing as well nails thick brittle crumbly friable dystrophic 1 through 4 bilateral.       Assessment & Plan:  Assessment this time ulcers are resolved nails thick brittle crumbly dystrophic or debridement presence of diabetes and cocking factors also at this time debridement of keratoses distal clavus fourth left sub-second left and pinch callus hallux bilateral return for future palliative care and diabetic foot nail care is needed recommended 3 month followup  Harriet Masson DPM

## 2014-05-17 DIAGNOSIS — E1149 Type 2 diabetes mellitus with other diabetic neurological complication: Secondary | ICD-10-CM | POA: Diagnosis not present

## 2014-06-16 ENCOUNTER — Ambulatory Visit (INDEPENDENT_AMBULATORY_CARE_PROVIDER_SITE_OTHER): Payer: Medicare Other

## 2014-06-16 VITALS — BP 115/69 | HR 79 | Resp 16

## 2014-06-16 DIAGNOSIS — E1142 Type 2 diabetes mellitus with diabetic polyneuropathy: Secondary | ICD-10-CM

## 2014-06-16 DIAGNOSIS — M069 Rheumatoid arthritis, unspecified: Secondary | ICD-10-CM

## 2014-06-16 DIAGNOSIS — M2010 Hallux valgus (acquired), unspecified foot: Secondary | ICD-10-CM

## 2014-06-16 DIAGNOSIS — L97509 Non-pressure chronic ulcer of other part of unspecified foot with unspecified severity: Secondary | ICD-10-CM

## 2014-06-16 DIAGNOSIS — E1140 Type 2 diabetes mellitus with diabetic neuropathy, unspecified: Secondary | ICD-10-CM

## 2014-06-16 DIAGNOSIS — E1149 Type 2 diabetes mellitus with other diabetic neurological complication: Secondary | ICD-10-CM

## 2014-06-16 DIAGNOSIS — M2040 Other hammer toe(s) (acquired), unspecified foot: Secondary | ICD-10-CM

## 2014-06-16 NOTE — Patient Instructions (Addendum)
ANTIBACTERIAL SOAP INSTRUCTIONS  THE DAY AFTER PROCEDURE  Please follow the instructions your doctor has marked.   Shower as usual. Before getting out, place a drop of antibacterial liquid soap (Dial) on a wet, clean washcloth.  Gently wipe washcloth over affected area.  Afterward, rinse the area with warm water.  Blot the area dry with a soft cloth and cover with antibiotic ointment (neosporin, polysporin, bacitracin) and band aid or gauze and tape  Place 3-4 drops of antibacterial liquid soap in a quart of warm tap water.  Submerge foot into water for 20 minutes.  If bandage was applied after your procedure, leave on to allow for easy lift off, then remove and continue with soak for the remaining time.  Next, blot area dry with a soft cloth and cover with a bandage.  Apply other medications as directed by your doctor, such as cortisporin otic solution (eardrops) or neosporin antibiotic ointment  After washing and drying thoroughly applying Neosporin or Polysporin or Silvadene cream and a Band-Aid dressing until resolved her heel  Maintain diabetic shoes and diabetic insoles at all times do not want barefoot or in shoes without diabetic insoles

## 2014-06-16 NOTE — Progress Notes (Signed)
   Subjective:    Patient ID: [name], female    DOB: 1929-06-01, 80 y.o.   MRN: [id number]  HPI Comments: "Its real thick and has this blood spot."  Follow up sub 2nd MPJ left      Review of Systems no new findings or systemic changes     Objective:   Physical Exam 80 year old... at this time with an aggravation of the keratotic lesion sub-first MTP area left foot patient had history of ulcerations has cavus foot with rheumatoid arthropathy as well as diabetic neuropathy and deformity of the foot at this time hemorrhage a keratotic lesion indicating ulcer down to the dermal level sub-first MTP area left foot. Patient has severe contractures and hit his head foot nail care with proxy month ago however this time has severe exacerbation of the first MTP area left examination reveals her diabetic shoes do not have the insoles in your average to remove them with murmur doing so however his walking very hard in soled surface. The diabetic insoles are issued at this time for patient to use and maintain cushioned Plastizote insoles at all times to prevent this ulceration. Neurovascular status otherwise unchanged pedal pulses are palpable DP and PT plus one over 4 bilateral Or time skin warm temperature noted cover refill 4 seconds all digits epicritic and proprioceptive sensations are intact bilateral although decreased sensation of the forefoot and digits. Severe arthropathy with deformities of toes and hemorrhage a keratoses and ulceration due to atrophy of the plantar fat pad.       Assessment & Plan:  Diabetes and complications were neuropathy and angiopathy early ulcer sub-1 left is debrided down to dermal level Silvadene and bandage dressing are applied maintain Neosporin Silvadene dressing daily also you cushioned insoles are dispensed for patient use recheck in one to 2 months for reevaluation. He denies any signs of infection no ascending psoas lymphangitis no need for oral  antibiotic however if any changes occur she is to contact us immediately. Recheck in one month  Harriet Masson DPM

## 2014-07-06 ENCOUNTER — Other Ambulatory Visit: Payer: Self-pay | Admitting: *Deleted

## 2014-07-06 NOTE — Telephone Encounter (Signed)
Refill request for Silvadene Cream.  Dr. Noralyn Pick the refill plus 2 additional refills.

## 2014-07-09 DIAGNOSIS — Z23 Encounter for immunization: Secondary | ICD-10-CM | POA: Diagnosis not present

## 2014-07-13 DIAGNOSIS — Z0001 Encounter for general adult medical examination with abnormal findings: Secondary | ICD-10-CM | POA: Diagnosis not present

## 2014-07-13 DIAGNOSIS — E780 Pure hypercholesterolemia: Secondary | ICD-10-CM | POA: Diagnosis not present

## 2014-07-13 DIAGNOSIS — E119 Type 2 diabetes mellitus without complications: Secondary | ICD-10-CM | POA: Diagnosis not present

## 2014-07-13 DIAGNOSIS — I10 Essential (primary) hypertension: Secondary | ICD-10-CM | POA: Diagnosis not present

## 2014-07-14 DIAGNOSIS — M25561 Pain in right knee: Secondary | ICD-10-CM | POA: Diagnosis not present

## 2014-07-14 DIAGNOSIS — M1711 Unilateral primary osteoarthritis, right knee: Secondary | ICD-10-CM | POA: Diagnosis not present

## 2014-07-21 DIAGNOSIS — R197 Diarrhea, unspecified: Secondary | ICD-10-CM | POA: Diagnosis not present

## 2014-07-21 DIAGNOSIS — Z87898 Personal history of other specified conditions: Secondary | ICD-10-CM | POA: Diagnosis not present

## 2014-07-21 DIAGNOSIS — E1149 Type 2 diabetes mellitus with other diabetic neurological complication: Secondary | ICD-10-CM | POA: Diagnosis not present

## 2014-07-21 DIAGNOSIS — E780 Pure hypercholesterolemia: Secondary | ICD-10-CM | POA: Diagnosis not present

## 2014-07-22 DIAGNOSIS — Z1231 Encounter for screening mammogram for malignant neoplasm of breast: Secondary | ICD-10-CM | POA: Diagnosis not present

## 2014-07-22 DIAGNOSIS — E780 Pure hypercholesterolemia: Secondary | ICD-10-CM | POA: Diagnosis not present

## 2014-07-22 DIAGNOSIS — E119 Type 2 diabetes mellitus without complications: Secondary | ICD-10-CM | POA: Diagnosis not present

## 2014-07-22 DIAGNOSIS — Z23 Encounter for immunization: Secondary | ICD-10-CM | POA: Diagnosis not present

## 2014-07-27 ENCOUNTER — Ambulatory Visit: Payer: Medicare Other

## 2014-07-29 DIAGNOSIS — R921 Mammographic calcification found on diagnostic imaging of breast: Secondary | ICD-10-CM | POA: Diagnosis not present

## 2014-08-03 ENCOUNTER — Ambulatory Visit: Payer: Medicare Other

## 2014-08-03 ENCOUNTER — Ambulatory Visit (INDEPENDENT_AMBULATORY_CARE_PROVIDER_SITE_OTHER): Payer: Medicare Other

## 2014-08-03 VITALS — BP 138/76 | HR 84 | Resp 16

## 2014-08-03 DIAGNOSIS — Q828 Other specified congenital malformations of skin: Secondary | ICD-10-CM

## 2014-08-03 DIAGNOSIS — E1140 Type 2 diabetes mellitus with diabetic neuropathy, unspecified: Secondary | ICD-10-CM

## 2014-08-03 DIAGNOSIS — B351 Tinea unguium: Secondary | ICD-10-CM | POA: Diagnosis not present

## 2014-08-03 DIAGNOSIS — M2010 Hallux valgus (acquired), unspecified foot: Secondary | ICD-10-CM

## 2014-08-03 DIAGNOSIS — M79673 Pain in unspecified foot: Secondary | ICD-10-CM | POA: Diagnosis not present

## 2014-08-03 NOTE — Patient Instructions (Signed)
Diabetes and Foot Care Diabetes may cause you to have problems because of poor blood supply (circulation) to your feet and legs. This may cause the skin on your feet to become thinner, break easier, and heal more slowly. Your skin may become dry, and the skin may peel and crack. You may also have nerve damage in your legs and feet causing decreased feeling in them. You may not notice minor injuries to your feet that could lead to infections or more serious problems. Taking care of your feet is one of the most important things you can do for yourself.  HOME CARE INSTRUCTIONS  Wear shoes at all times, even in the house. Do not go barefoot. Bare feet are easily injured.  Check your feet daily for blisters, cuts, and redness. If you cannot see the bottom of your feet, use a mirror or ask someone for help.  Wash your feet with warm water (do not use hot water) and mild soap. Then pat your feet and the areas between your toes until they are completely dry. Do not soak your feet as this can dry your skin.  Apply a moisturizing lotion or petroleum jelly (that does not contain alcohol and is unscented) to the skin on your feet and to dry, brittle toenails. Do not apply lotion between your toes.  Trim your toenails straight across. Do not dig under them or around the cuticle. File the edges of your nails with an emery board or nail file.  Do not cut corns or calluses or try to remove them with medicine.  Wear clean socks or stockings every day. Make sure they are not too tight. Do not wear knee-high stockings since they may decrease blood flow to your legs.  Wear shoes that fit properly and have enough cushioning. To break in new shoes, wear them for just a few hours a day. This prevents you from injuring your feet. Always look in your shoes before you put them on to be sure there are no objects inside.  Do not cross your legs. This may decrease the blood flow to your feet.  If you find a minor scrape,  cut, or break in the skin on your feet, keep it and the skin around it clean and dry. These areas may be cleansed with mild soap and water. Do not cleanse the area with peroxide, alcohol, or iodine.  When you remove an adhesive bandage, be sure not to damage the skin around it.  If you have a wound, look at it several times a day to make sure it is healing.  Do not use heating pads or hot water bottles. They may burn your skin. If you have lost feeling in your feet or legs, you may not know it is happening until it is too late.  Make sure your health care provider performs a complete foot exam at least annually or more often if you have foot problems. Report any cuts, sores, or bruises to your health care provider immediately. SEEK MEDICAL CARE IF:   You have an injury that is not healing.  You have cuts or breaks in the skin.  You have an ingrown nail.  You notice redness on your legs or feet.  You feel burning or tingling in your legs or feet.  You have pain or cramps in your legs and feet.  Your legs or feet are numb.  Your feet always feel cold. SEEK IMMEDIATE MEDICAL CARE IF:   There is increasing redness,   swelling, or pain in or around a wound.  There is a red line that goes up your leg.  Pus is coming from a wound.  You develop a fever or as directed by your health care provider.  You notice a bad smell coming from an ulcer or wound. Document Released: 09/07/2000 Document Revised: 05/13/2013 Document Reviewed: 02/17/2013 Exit[employer] Patient Information 2015 [employer]. This information is not intended to replace advice given to you by your health care provider. Make sure you discuss any questions you have with your health care provider.  

## 2014-08-03 NOTE — Progress Notes (Signed)
   Subjective:    Patient ID: ELEEN LITZ, female    DOB: 1929/08/13, 80 y.o.   MRN: [id number]  HPI Comments: "He just needs to check and trim these places. And I guess the nails"  Follow up sub 1st MPJ left      Review of Systemsno new systemic changes or findings no active bleeding or discharge patient states of keratoses medial second digit right foot and subsecond MTP area left foot. Hemorrhage a keratoses and both areas are debrided at this time.     Objective:   Physical Exam [age] female well-developed well-nourished oriented presents this time for continued all pre-ulceration sub-second MTP area left and her first and second MTP her left secondary to cavus foot and HAV deformity and digital contractures with rheumatoid arthropathy. Patient also has hammertoe deformity with medial keratoses second digit right foot is a bunion hammertoe and lapping digit. Nails thick brittle crumbly dystrophic probably orthotic discolored 1 through 5 bilateral debrided at this time. Has no other changes to ask her status is intact DP and PT intact with thready DP and PT plus one over 4 bilateral capillary fill time 3 seconds epicritic sensations diminished on Semmes Weinstein to the forefoot and digits otherwise sensation intact severe arthropathy deformities are identified bilateral no active infection is no active ulcer at this time.       Assessment & Plan:  Assessment diabetes with contractures and complications peripheral neuropathy and angiopathy multiple dystrophic nails debrided the presence of onychomycosis pain symptomology and diabetes this time also debrided multiple keratoses second right and sub-first and second left follow-up in 3 months or 2-3 months as needed for palliative care next  Harriet Masson DPM

## 2014-08-04 ENCOUNTER — Ambulatory Visit: Payer: Medicare Other | Admitting: Podiatry

## 2014-08-25 DIAGNOSIS — Z79899 Other long term (current) drug therapy: Secondary | ICD-10-CM | POA: Diagnosis not present

## 2014-08-25 DIAGNOSIS — M810 Age-related osteoporosis without current pathological fracture: Secondary | ICD-10-CM | POA: Diagnosis not present

## 2014-08-25 DIAGNOSIS — M0579 Rheumatoid arthritis with rheumatoid factor of multiple sites without organ or systems involvement: Secondary | ICD-10-CM | POA: Diagnosis not present

## 2014-08-25 DIAGNOSIS — M5136 Other intervertebral disc degeneration, lumbar region: Secondary | ICD-10-CM | POA: Diagnosis not present

## 2014-08-27 DIAGNOSIS — M0579 Rheumatoid arthritis with rheumatoid factor of multiple sites without organ or systems involvement: Secondary | ICD-10-CM | POA: Diagnosis not present

## 2014-09-28 ENCOUNTER — Ambulatory Visit: Payer: Medicare Other

## 2014-09-28 ENCOUNTER — Ambulatory Visit (INDEPENDENT_AMBULATORY_CARE_PROVIDER_SITE_OTHER): Payer: Medicare Other

## 2014-09-28 DIAGNOSIS — Q828 Other specified congenital malformations of skin: Secondary | ICD-10-CM | POA: Diagnosis not present

## 2014-09-28 DIAGNOSIS — B351 Tinea unguium: Secondary | ICD-10-CM

## 2014-09-28 DIAGNOSIS — L97509 Non-pressure chronic ulcer of other part of unspecified foot with unspecified severity: Secondary | ICD-10-CM

## 2014-09-28 DIAGNOSIS — M79673 Pain in unspecified foot: Secondary | ICD-10-CM | POA: Diagnosis not present

## 2014-09-28 DIAGNOSIS — E1140 Type 2 diabetes mellitus with diabetic neuropathy, unspecified: Secondary | ICD-10-CM

## 2014-09-28 DIAGNOSIS — L02619 Cutaneous abscess of unspecified foot: Secondary | ICD-10-CM

## 2014-09-28 DIAGNOSIS — L97529 Non-pressure chronic ulcer of other part of left foot with unspecified severity: Secondary | ICD-10-CM

## 2014-09-28 DIAGNOSIS — L03119 Cellulitis of unspecified part of limb: Secondary | ICD-10-CM

## 2014-09-28 MED ORDER — CEPHALEXIN 500 MG PO CAPS: 500.0000 mg | ORAL_CAPSULE | Freq: Three times a day (TID) | ORAL | Status: DC

## 2014-09-28 NOTE — Addendum Note (Signed)
Addended by: Clovis Riley E on: 09/28/2014 12:35 PM   Modules accepted: Orders

## 2014-09-28 NOTE — Patient Instructions (Signed)
Diabetes and Foot Care Diabetes may cause you to have problems because of poor blood supply (circulation) to your feet and legs. This may cause the skin on your feet to become thinner, break easier, and heal more slowly. Your skin may become dry, and the skin may peel and crack. You may also have nerve damage in your legs and feet causing decreased feeling in them. You may not notice minor injuries to your feet that could lead to infections or more serious problems. Taking care of your feet is one of the most important things you can do for yourself.  HOME CARE INSTRUCTIONS  Wear shoes at all times, even in the house. Do not go barefoot. Bare feet are easily injured.  Check your feet daily for blisters, cuts, and redness. If you cannot see the bottom of your feet, use a mirror or ask someone for help.  Wash your feet with warm water (do not use hot water) and mild soap. Then pat your feet and the areas between your toes until they are completely dry. Do not soak your feet as this can dry your skin.  Apply a moisturizing lotion or petroleum jelly (that does not contain alcohol and is unscented) to the skin on your feet and to dry, brittle toenails. Do not apply lotion between your toes.  Trim your toenails straight across. Do not dig under them or around the cuticle. File the edges of your nails with an emery board or nail file.  Do not cut corns or calluses or try to remove them with medicine.  Wear clean socks or stockings every day. Make sure they are not too tight. Do not wear knee-high stockings since they may decrease blood flow to your legs.  Wear shoes that fit properly and have enough cushioning. To break in new shoes, wear them for just a few hours a day. This prevents you from injuring your feet. Always look in your shoes before you put them on to be sure there are no objects inside.  Do not cross your legs. This may decrease the blood flow to your feet.  If you find a minor scrape,  cut, or break in the skin on your feet, keep it and the skin around it clean and dry. These areas may be cleansed with mild soap and water. Do not cleanse the area with peroxide, alcohol, or iodine.  When you remove an adhesive bandage, be sure not to damage the skin around it.  If you have a wound, look at it several times a day to make sure it is healing.  Do not use heating pads or hot water bottles. They may burn your skin. If you have lost feeling in your feet or legs, you may not know it is happening until it is too late.  Make sure your health care provider performs a complete foot exam at least annually or more often if you have foot problems. Report any cuts, sores, or bruises to your health care provider immediately. SEEK MEDICAL CARE IF:   You have an injury that is not healing.  You have cuts or breaks in the skin.  You have an ingrown nail.  You notice redness on your legs or feet.  You feel burning or tingling in your legs or feet.  You have pain or cramps in your legs and feet.  Your legs or feet are numb.  Your feet always feel cold. SEEK IMMEDIATE MEDICAL CARE IF:   There is increasing redness,   swelling, or pain in or around a wound.  There is a red line that goes up your leg.  Pus is coming from a wound.  You develop a fever or as directed by your health care provider.  You notice a bad smell coming from an ulcer or wound. Document Released: 09/07/2000 Document Revised: 05/13/2013 Document Reviewed: 02/17/2013 Exit[employer] Patient Information 2015 [employer]. This information is not intended to replace advice given to you by your health care provider. Make sure you discuss any questions you have with your health care provider.  

## 2014-09-28 NOTE — Progress Notes (Signed)
   Subjective:    Patient ID: [name], female    DOB: Jul 02, 1929, 81 y.o.   MRN: [id number]  HPI Comments: "Need the toenails and calluses trimmed"  Calluses: Sub 1st and 2nd MPJ left     Review of Systems no new findings or systemic changes noted. There is some increased pain sub-first MTP area left and on exam appears to be a new blister/ulcer formation.     Objective:   Physical Exam Lower extremity objective findings vascular status appears to be intact he'll pulses palpable DP and PT 1 over 4 Refill timed 3-4 seconds there is severe HAV deformity lateral deviation of hallux overlapping lesser digits there is plantar grade metatarsal first left with hemorrhage a keratoses and a new blister down the dermal level nails thick brittle [place] friable dystrophic and discolored 1 through 5 bilateral also absent hair growth diminished to absent epicritic and proprioceptive sensations on [employer] testing. Normal plantar response DTRs not listed. No ascending cellulitis or lymphangitis no fever or chills       Assessment & Plan:  Assessment this time is diabetes with history of peripheral neuropathy severe deformities with bunion and hammertoe deformities and associated keratoses and pre-ulcer. The ulcer lesion is debrided and abscess is identified at this time culture and sensitivity are obtained the ulcer site is dressed with Iodosorb and gauze dressing and we'll initiate daily Silvadene gauze dressings at home cleansing with an abductor soap and water daily dressing changes to foam pads or issued for bunions bilateral patient is also nails thick brittle dystrophic friable mycotic debrided 10 at this time return in 2 weeks for ulcer check patient will do antibiotic systems instructed daily dressing changes or might return in 3 months for palliative nail care. Multiple keratoses pinch callus of both hallux are debrided at this time as well as HD 5 bilateral and ulceration  sub-first MTP area is left is addressed with iodine culture obtained.  Harriet Masson DPM

## 2014-09-28 NOTE — Addendum Note (Signed)
Addended by: Harriet Masson on: 09/28/2014 05:18 PM   Modules accepted: Orders

## 2014-10-11 DIAGNOSIS — E118 Type 2 diabetes mellitus with unspecified complications: Secondary | ICD-10-CM | POA: Diagnosis not present

## 2014-10-11 DIAGNOSIS — M810 Age-related osteoporosis without current pathological fracture: Secondary | ICD-10-CM | POA: Diagnosis not present

## 2014-10-11 DIAGNOSIS — E119 Type 2 diabetes mellitus without complications: Secondary | ICD-10-CM | POA: Diagnosis not present

## 2014-10-12 ENCOUNTER — Ambulatory Visit (INDEPENDENT_AMBULATORY_CARE_PROVIDER_SITE_OTHER): Payer: Medicare Other

## 2014-10-12 VITALS — BP 126/72 | HR 86 | Resp 18

## 2014-10-12 DIAGNOSIS — M79673 Pain in unspecified foot: Secondary | ICD-10-CM

## 2014-10-12 DIAGNOSIS — L97529 Non-pressure chronic ulcer of other part of left foot with unspecified severity: Secondary | ICD-10-CM

## 2014-10-12 DIAGNOSIS — Q828 Other specified congenital malformations of skin: Secondary | ICD-10-CM

## 2014-10-12 NOTE — Progress Notes (Signed)
   Subjective:    Patient ID: [name], female    DOB: 06/05/1929, 81 y.o.   MRN: [id number]  HPI THE PLACE ON THE BALL OF MY LEFT FOOT IS DOING BETTER AND THERE IS NO DRAINING    Review of Systems no new findings or systemic changes noted no drainage no discharge no fever chills     Objective:   Physical Exam Lower extremity objective findings unchanged pedal pulses DP and PT one over 4 bilateral there is notable HAV deformity left foot lateral deviation of the hallux with keratoses sub-first MTP area left more so than right. Right hallux is relatively rectus although digital contractures lesser toes are noted bilateral. The keratoses or history of ulceration sub-first left his result this time no open wounds no discharge or drainage noted patient is completing her last round of antibiotics cephalexin it was prescribed. Lensing applying dressings daily as instructed wearing diabetic shoes with insoles as instructed. There is some diffuse hemorrhage a keratoses sub-first MTP area left foot which is debrided leaving showing good new epithelialized tissue no open wounds no discharge no drainage no fissure no signs of infection left foot.       Assessment & Plan:  Assessment resolved ulcer/abscess and cellulitis of left foot sub-first MTP area. These are debrided maintain some cream cocoa butter lotion or Neosporin which is applied at this time. Maintain appropriate coming shoes return in 2 months to 3 months for long-term follow-up diabetic foot and nail care in the future as needed ago patient did have cultures done which was unremarkable organism identified clinically excellent response to treatment the wound is healed no edema no erythema however will maintain some tube foam padding which is dispensed to cushion the bunion areas bilateral. Reappointed in 2-3 months next  [name] DPM

## 2014-10-12 NOTE — Patient Instructions (Signed)
Diabetes and Foot Care Diabetes may cause you to have problems because of poor blood supply (circulation) to your feet and legs. This may cause the skin on your feet to become thinner, break easier, and heal more slowly. Your skin may become dry, and the skin may peel and crack. You may also have nerve damage in your legs and feet causing decreased feeling in them. You may not notice minor injuries to your feet that could lead to infections or more serious problems. Taking care of your feet is one of the most important things you can do for yourself.  HOME CARE INSTRUCTIONS  Wear shoes at all times, even in the house. Do not go barefoot. Bare feet are easily injured.  Check your feet daily for blisters, cuts, and redness. If you cannot see the bottom of your feet, use a mirror or ask someone for help.  Wash your feet with warm water (do not use hot water) and mild soap. Then pat your feet and the areas between your toes until they are completely dry. Do not soak your feet as this can dry your skin.  Apply a moisturizing lotion or petroleum jelly (that does not contain alcohol and is unscented) to the skin on your feet and to dry, brittle toenails. Do not apply lotion between your toes.  Trim your toenails straight across. Do not dig under them or around the cuticle. File the edges of your nails with an emery board or nail file.  Do not cut corns or calluses or try to remove them with medicine.  Wear clean socks or stockings every day. Make sure they are not too tight. Do not wear knee-high stockings since they may decrease blood flow to your legs.  Wear shoes that fit properly and have enough cushioning. To break in new shoes, wear them for just a few hours a day. This prevents you from injuring your feet. Always look in your shoes before you put them on to be sure there are no objects inside.  Do not cross your legs. This may decrease the blood flow to your feet.  If you find a minor scrape,  cut, or break in the skin on your feet, keep it and the skin around it clean and dry. These areas may be cleansed with mild soap and water. Do not cleanse the area with peroxide, alcohol, or iodine.  When you remove an adhesive bandage, be sure not to damage the skin around it.  If you have a wound, look at it several times a day to make sure it is healing.  Do not use heating pads or hot water bottles. They may burn your skin. If you have lost feeling in your feet or legs, you may not know it is happening until it is too late.  Make sure your health care provider performs a complete foot exam at least annually or more often if you have foot problems. Report any cuts, sores, or bruises to your health care provider immediately. SEEK MEDICAL CARE IF:   You have an injury that is not healing.  You have cuts or breaks in the skin.  You have an ingrown nail.  You notice redness on your legs or feet.  You feel burning or tingling in your legs or feet.  You have pain or cramps in your legs and feet.  Your legs or feet are numb.  Your feet always feel cold. SEEK IMMEDIATE MEDICAL CARE IF:   There is increasing redness,   swelling, or pain in or around a wound.  There is a red line that goes up your leg.  Pus is coming from a wound.  You develop a fever or as directed by your health care provider.  You notice a bad smell coming from an ulcer or wound. Document Released: 09/07/2000 Document Revised: 05/13/2013 Document Reviewed: 02/17/2013 Exit[employer] Patient Information 2015 [employer]. This information is not intended to replace advice given to you by your health care provider. Make sure you discuss any questions you have with your health care provider.  

## 2014-10-28 DIAGNOSIS — K219 Gastro-esophageal reflux disease without esophagitis: Secondary | ICD-10-CM | POA: Diagnosis not present

## 2014-10-28 DIAGNOSIS — J329 Chronic sinusitis, unspecified: Secondary | ICD-10-CM | POA: Diagnosis not present

## 2014-10-29 DIAGNOSIS — M1711 Unilateral primary osteoarthritis, right knee: Secondary | ICD-10-CM | POA: Diagnosis not present

## 2014-11-04 DIAGNOSIS — N390 Urinary tract infection, site not specified: Secondary | ICD-10-CM | POA: Diagnosis not present

## 2014-11-04 DIAGNOSIS — R358 Other polyuria: Secondary | ICD-10-CM | POA: Diagnosis not present

## 2014-11-15 DIAGNOSIS — E780 Pure hypercholesterolemia: Secondary | ICD-10-CM | POA: Diagnosis not present

## 2014-11-15 DIAGNOSIS — E119 Type 2 diabetes mellitus without complications: Secondary | ICD-10-CM | POA: Diagnosis not present

## 2014-11-15 DIAGNOSIS — I10 Essential (primary) hypertension: Secondary | ICD-10-CM | POA: Diagnosis not present

## 2014-11-29 DIAGNOSIS — R358 Other polyuria: Secondary | ICD-10-CM | POA: Diagnosis not present

## 2014-11-29 DIAGNOSIS — R3915 Urgency of urination: Secondary | ICD-10-CM | POA: Diagnosis not present

## 2014-11-29 DIAGNOSIS — N390 Urinary tract infection, site not specified: Secondary | ICD-10-CM | POA: Diagnosis not present

## 2014-12-03 ENCOUNTER — Other Ambulatory Visit: Payer: Self-pay

## 2014-12-03 MED ORDER — SILVER SULFADIAZINE 1 % EX CREA: 1.0000 "application " | TOPICAL_CREAM | Freq: Every day | CUTANEOUS | Status: DC

## 2014-12-10 DIAGNOSIS — E780 Pure hypercholesterolemia: Secondary | ICD-10-CM | POA: Diagnosis not present

## 2014-12-10 DIAGNOSIS — Z8673 Personal history of transient ischemic attack (TIA), and cerebral infarction without residual deficits: Secondary | ICD-10-CM | POA: Diagnosis not present

## 2014-12-14 ENCOUNTER — Ambulatory Visit (INDEPENDENT_AMBULATORY_CARE_PROVIDER_SITE_OTHER): Payer: Medicare Other

## 2014-12-14 VITALS — BP 114/86 | HR 79 | Resp 12

## 2014-12-14 DIAGNOSIS — E1140 Type 2 diabetes mellitus with diabetic neuropathy, unspecified: Secondary | ICD-10-CM

## 2014-12-14 DIAGNOSIS — B351 Tinea unguium: Secondary | ICD-10-CM | POA: Diagnosis not present

## 2014-12-14 DIAGNOSIS — M2010 Hallux valgus (acquired), unspecified foot: Secondary | ICD-10-CM

## 2014-12-14 DIAGNOSIS — M79673 Pain in unspecified foot: Secondary | ICD-10-CM | POA: Diagnosis not present

## 2014-12-14 DIAGNOSIS — Q828 Other specified congenital malformations of skin: Secondary | ICD-10-CM

## 2014-12-14 DIAGNOSIS — M2040 Other hammer toe(s) (acquired), unspecified foot: Secondary | ICD-10-CM

## 2014-12-14 NOTE — Progress Notes (Signed)
   Subjective:    Patient ID: [name], female    DOB: 1929/03/11, 81 y.o.   MRN: [id number]  HPI  ''LT BOTTOM OF THE FOOT IS A LITTLE SORE AND THE SKIN IS GETTING A LITTLE THICKER.''  Review of Systems no new findings or systemic changes noted     Objective:   Physical Exam 81 year old white female well-developed well-nourished oriented 3 presents this time with painful keratotic lesion sub-first MTP area of the foot should be scheduled for diabetic care month from now however cannot wait that long starting have breakdown in the skin. Also thick brittle criptotic incurvated nails 1 through 5 on the right 134 and 5 on the left. Has had amputation second toe left foot. Patient is severe HAV deformity bilateral with subluxation dislocation of the toes both the great toe and lesser toes on the left foot with an amputated second. Grade metatarsal with hemorrhage a keratoses sub-first MTP area left no other keratoses noted no open wounds no ulcers are identified vascular status is intact although diminished DP and PT one over 4 bilateral Refill time 4-5 seconds all digits proprioceptive sensations grossly diminished on Semmes Weinstein to the forefoot digits and arch bilateral there is no active ulcer all history of ulcers noted. Nails brittle crumbly friable discolored and tender debrided this time 9.      Assessment & Plan:  Assessment diabetes with history peripheral neuropathy and angiopathy. Painful mycotic nails debrided 9 the presence of diabetes as well as onychomycosis and discoloration friability of nails Debrided at this time. Still keratotic lesion sub-first left is debrided down to dermal level does not there is no open wounds no ulcers no secondary infection there is hemorrhage a keratoses identified or pre-also keratoses maintained extra-depth shoes she gets her shoes and biotech advised she is to get replacement of shoes and insoles at this time. Patient cannot go 3 months  between debridements and treatment recommended 2 month follow-up for palliative diabetic foot care in the future  Harriet Masson DPM

## 2014-12-21 ENCOUNTER — Ambulatory Visit: Payer: Medicare Other

## 2014-12-27 DIAGNOSIS — M810 Age-related osteoporosis without current pathological fracture: Secondary | ICD-10-CM | POA: Diagnosis not present

## 2014-12-27 DIAGNOSIS — H3531 Nonexudative age-related macular degeneration: Secondary | ICD-10-CM | POA: Diagnosis not present

## 2014-12-27 DIAGNOSIS — M5136 Other intervertebral disc degeneration, lumbar region: Secondary | ICD-10-CM | POA: Diagnosis not present

## 2014-12-27 DIAGNOSIS — M0579 Rheumatoid arthritis with rheumatoid factor of multiple sites without organ or systems involvement: Secondary | ICD-10-CM | POA: Diagnosis not present

## 2014-12-27 DIAGNOSIS — Z961 Presence of intraocular lens: Secondary | ICD-10-CM | POA: Diagnosis not present

## 2014-12-27 DIAGNOSIS — H52203 Unspecified astigmatism, bilateral: Secondary | ICD-10-CM | POA: Diagnosis not present

## 2014-12-27 DIAGNOSIS — E119 Type 2 diabetes mellitus without complications: Secondary | ICD-10-CM | POA: Diagnosis not present

## 2015-01-04 ENCOUNTER — Ambulatory Visit: Payer: Medicare Other

## 2015-01-05 DIAGNOSIS — M791 Myalgia: Secondary | ICD-10-CM | POA: Diagnosis not present

## 2015-01-05 DIAGNOSIS — M810 Age-related osteoporosis without current pathological fracture: Secondary | ICD-10-CM | POA: Diagnosis not present

## 2015-01-11 ENCOUNTER — Ambulatory Visit: Payer: Medicare Other

## 2015-01-20 DIAGNOSIS — E780 Pure hypercholesterolemia: Secondary | ICD-10-CM | POA: Diagnosis not present

## 2015-01-24 DIAGNOSIS — E780 Pure hypercholesterolemia: Secondary | ICD-10-CM | POA: Diagnosis not present

## 2015-01-24 DIAGNOSIS — E119 Type 2 diabetes mellitus without complications: Secondary | ICD-10-CM | POA: Diagnosis not present

## 2015-01-27 DIAGNOSIS — E780 Pure hypercholesterolemia: Secondary | ICD-10-CM | POA: Diagnosis not present

## 2015-01-27 DIAGNOSIS — E119 Type 2 diabetes mellitus without complications: Secondary | ICD-10-CM | POA: Diagnosis not present

## 2015-01-31 DIAGNOSIS — R920 Mammographic microcalcification found on diagnostic imaging of breast: Secondary | ICD-10-CM | POA: Diagnosis not present

## 2015-02-09 DIAGNOSIS — M1711 Unilateral primary osteoarthritis, right knee: Secondary | ICD-10-CM | POA: Diagnosis not present

## 2015-02-09 DIAGNOSIS — M25561 Pain in right knee: Secondary | ICD-10-CM | POA: Diagnosis not present

## 2015-02-10 DIAGNOSIS — Z79899 Other long term (current) drug therapy: Secondary | ICD-10-CM | POA: Diagnosis not present

## 2015-02-11 ENCOUNTER — Ambulatory Visit (INDEPENDENT_AMBULATORY_CARE_PROVIDER_SITE_OTHER): Payer: Medicare Other | Admitting: Podiatry

## 2015-02-11 DIAGNOSIS — B351 Tinea unguium: Secondary | ICD-10-CM | POA: Diagnosis not present

## 2015-02-11 DIAGNOSIS — M79676 Pain in unspecified toe(s): Secondary | ICD-10-CM | POA: Diagnosis not present

## 2015-02-11 DIAGNOSIS — E1140 Type 2 diabetes mellitus with diabetic neuropathy, unspecified: Secondary | ICD-10-CM | POA: Diagnosis not present

## 2015-02-11 DIAGNOSIS — Q828 Other specified congenital malformations of skin: Secondary | ICD-10-CM

## 2015-02-14 ENCOUNTER — Ambulatory Visit: Payer: Medicare Other | Admitting: Podiatry

## 2015-02-14 NOTE — Progress Notes (Signed)
Patient ID: [name], female   DOB: 07-12-1929, 81 y.o.   MRN: [id number]  Subjective: 81 y.o.-year-old female returns the office today for painful, elongated, thickened toenails which she is unable to trim herself. Denies any redness or drainage around the nails. Denies any acute changes since last appointment and no new complaints today. Denies any systemic complaints such as fevers, chills, nausea, vomiting.   Objective: AAO 3, NAD DP/PT pulses palpable, CRT less than 3 seconds Previous left 2nd digit amputation.  Protective sensation decreased with Derrel Nip monofilament  Nails hypertrophic, dystrophic, elongated, brittle, discolored 9. There is tenderness overlying the nails. There is no surrounding erythema or drainage along the nail sites. Left submetatarsal 1 hyperkerotic lesion. Upon debridement, no underlying ulceration, drainage, or clinical signs of infection.  No open lesions or other pre-ulcerative lesions are identified. No other areas of tenderness bilateral lower extremities. No overlying edema, erythema, increased warmth. No pain with calf compression, swelling, warmth, erythema.  Assessment: Patient presents with symptomatic onychomycosis; hyperkerotic lesion   Plan: -Treatment options including alternatives, risks, complications were discussed -Nails sharply debrided 9 without complication/bleeding. -Hyperkerotic lesion sharply debrided x 1 without complications/bleeding.  -Discussed daily foot inspection. If there are any changes, to call the office immediately.  -Follow-up in 3 months or sooner if any problems are to arise. In the meantime, encouraged to call the office with any questions, concerns, changes symptoms.

## 2015-02-15 ENCOUNTER — Ambulatory Visit: Payer: Medicare Other

## 2015-02-17 DIAGNOSIS — E119 Type 2 diabetes mellitus without complications: Secondary | ICD-10-CM | POA: Diagnosis not present

## 2015-02-17 DIAGNOSIS — E780 Pure hypercholesterolemia: Secondary | ICD-10-CM | POA: Diagnosis not present

## 2015-02-17 DIAGNOSIS — M0579 Rheumatoid arthritis with rheumatoid factor of multiple sites without organ or systems involvement: Secondary | ICD-10-CM | POA: Diagnosis not present

## 2015-02-17 DIAGNOSIS — I10 Essential (primary) hypertension: Secondary | ICD-10-CM | POA: Diagnosis not present

## 2015-02-28 ENCOUNTER — Ambulatory Visit: Payer: Medicare Other | Admitting: Podiatry

## 2015-02-28 DIAGNOSIS — L03114 Cellulitis of left upper limb: Secondary | ICD-10-CM | POA: Diagnosis not present

## 2015-03-10 ENCOUNTER — Encounter: Payer: Self-pay | Admitting: Podiatry

## 2015-03-10 ENCOUNTER — Ambulatory Visit (INDEPENDENT_AMBULATORY_CARE_PROVIDER_SITE_OTHER): Payer: Medicare Other | Admitting: Podiatry

## 2015-03-10 ENCOUNTER — Ambulatory Visit: Payer: Medicare Other | Admitting: Podiatry

## 2015-03-10 ENCOUNTER — Ambulatory Visit (INDEPENDENT_AMBULATORY_CARE_PROVIDER_SITE_OTHER): Payer: Medicare Other

## 2015-03-10 VITALS — BP 148/74 | HR 64 | Resp 12

## 2015-03-10 DIAGNOSIS — L89891 Pressure ulcer of other site, stage 1: Secondary | ICD-10-CM | POA: Diagnosis not present

## 2015-03-10 DIAGNOSIS — E11621 Type 2 diabetes mellitus with foot ulcer: Secondary | ICD-10-CM

## 2015-03-10 DIAGNOSIS — M2042 Other hammer toe(s) (acquired), left foot: Secondary | ICD-10-CM | POA: Diagnosis not present

## 2015-03-10 DIAGNOSIS — R52 Pain, unspecified: Secondary | ICD-10-CM | POA: Diagnosis not present

## 2015-03-10 DIAGNOSIS — L97529 Non-pressure chronic ulcer of other part of left foot with unspecified severity: Secondary | ICD-10-CM

## 2015-03-10 MED ORDER — MUPIROCIN 2 % EX OINT: TOPICAL_OINTMENT | CUTANEOUS | Status: DC

## 2015-03-10 MED ORDER — AMOXICILLIN-POT CLAVULANATE 500-125 MG PO TABS: 1.0000 | ORAL_TABLET | Freq: Three times a day (TID) | ORAL | Status: DC

## 2015-03-10 NOTE — Progress Notes (Signed)
She presents today with tenderness beneath her left foot. She states that she has severe rheumatoid arthritis and constantly develops calluses to the plantar aspect of her left foot. She denies fever chills nausea vomiting muscle aches and pains. States that she is recently finishing up an from a different doctor for her left arm. She thinks it's called cephalexin.  Objective: Vital signs are stable she is alert and oriented 3 severe digital deformities bilateral left greater than right with a reactive Callus beneath the first metatarsophalangeal joint. There is an associated blood blister at approximately 2:00 from the central core of the callus located beneath the first metatarsal head left foot. There is no purulence and no malodor just serosanguineous drainage. I debrided all necrotic and reactive hyperkeratotic tissue today none of this probed to bone. Radiographs did not demonstrate any type of osseous infection in this general area.  Assessment: Rheumatoid arthritis within ulceration plantar aspect left foot.  Plan: Debridement of all reactive hyperkeratosis demonstrated for her how to apply a dressing. I wrote a prescription for Bactroban ointment as well as Augmentin 500 mg twice daily. Placed her in a short Darco shoe and will follow-up with her in 2 weeks. She will watch for signs and symptoms of infection and notify us if there are any.

## 2015-03-24 ENCOUNTER — Encounter: Payer: Self-pay | Admitting: Podiatry

## 2015-03-24 ENCOUNTER — Ambulatory Visit (INDEPENDENT_AMBULATORY_CARE_PROVIDER_SITE_OTHER): Payer: Medicare Other | Admitting: Podiatry

## 2015-03-24 VITALS — BP 138/78 | HR 90 | Resp 18

## 2015-03-24 DIAGNOSIS — E1149 Type 2 diabetes mellitus with other diabetic neurological complication: Secondary | ICD-10-CM | POA: Diagnosis not present

## 2015-03-24 DIAGNOSIS — E11621 Type 2 diabetes mellitus with foot ulcer: Secondary | ICD-10-CM

## 2015-03-24 DIAGNOSIS — E780 Pure hypercholesterolemia: Secondary | ICD-10-CM | POA: Diagnosis not present

## 2015-03-24 DIAGNOSIS — L89891 Pressure ulcer of other site, stage 1: Secondary | ICD-10-CM

## 2015-03-24 DIAGNOSIS — L97529 Non-pressure chronic ulcer of other part of left foot with unspecified severity: Principal | ICD-10-CM

## 2015-03-27 NOTE — Progress Notes (Signed)
She presents today for follow-up of diabetic ulceration plantar aspect left foot.  Objective: Vital signs are stable she is alert and oriented 3 denies fever chills nausea vomiting was flexed and pains. Pulses are palpable left. No erythema saline as drainage or odor superficial ulceration scabbed over with hyperkeratotic tissue was debrided reveals some bleeding no signs of infection.  Assessment: Diabetic ulceration associated with thin skin prominent metatarsal heads, and chronic irritation.  Plan: Debridement today and then all necrotic and hyperkeratotic tissue redressed with a dry sterile compressive dressing encouraged her to continue conservative therapies will follow up with her in a few weeks. She will call sooner if needed.

## 2015-04-13 ENCOUNTER — Other Ambulatory Visit: Payer: Self-pay | Admitting: Podiatry

## 2015-04-15 ENCOUNTER — Ambulatory Visit (INDEPENDENT_AMBULATORY_CARE_PROVIDER_SITE_OTHER): Payer: Medicare Other | Admitting: Podiatry

## 2015-04-15 ENCOUNTER — Encounter: Payer: Self-pay | Admitting: Podiatry

## 2015-04-15 VITALS — HR 84 | Resp 18

## 2015-04-15 DIAGNOSIS — L84 Corns and callosities: Secondary | ICD-10-CM | POA: Diagnosis not present

## 2015-04-15 NOTE — Progress Notes (Signed)
Patient ID: [name], female   DOB: [birth year], 81 y.o.   MRN: [id number]  Subjective: 81 year old female presents the office today with concerns of the left foot wound and callus. She states that since her last appointment with me she followed up with Dr. Milinda Pointer she developed an infection overlying the site. She currently denies any redness or drainage along the site. She states that she periodically will get a thick callus which will breakdown causing an infection. She appears it has second toe amputation. No other complaints at this time. Denies any systemic complaints as fevers, chills, nausea, vomiting.  Objective: AAO 3, NAD DP/PT pulses palpable, CRT less than 3 seconds Left foot submetatarsal one is hyperkeratotic lesion. Upon debrided there is is, dried blood within the callus however the underlying skin is intact although it is pre-ulcerative. There is also small bulla just along the distal portion of the callus. Double appears to be old and there is no fluid currently in this area. It is a small mild epidermolysis. There is no swelling erythema, ascending cellulitis, flexions, crepitus, malodor, drainage/purulence. There is no increase in warmth. No edema to the foot. There is a significant HAV deformity present and there is been a previous amputation of the second toe. No other open lesions or pre-ulcer lesions identified. There is no pain with calf compression, swelling, warmth, erythema.  Assessment: 81 year old female left foot sub-metatarsal 1 pre-ulcerative callus  Plan: -Treatment options discussed including all alternatives, risks, and complications -Lesion which we debrided without complication/bleeding. The underlying skin intact although it is pre-ulcerative. Interbody: Was placed followed by a Band-Aid and an offloading pad. She was also given offloading pads. -Continue to monitor closely for any skin breakdown or any signs of infection. Call the office immediately if  any are to occur. -Follow-up in 3 weeks or sooner if any problems arise. In the meantime, encouraged to call the office with any questions, concerns, change in symptoms.   Celesta Gentile, DPM

## 2015-04-25 DIAGNOSIS — M0600 Rheumatoid arthritis without rheumatoid factor, unspecified site: Secondary | ICD-10-CM | POA: Diagnosis not present

## 2015-04-25 DIAGNOSIS — M150 Primary generalized (osteo)arthritis: Secondary | ICD-10-CM | POA: Diagnosis not present

## 2015-04-25 DIAGNOSIS — Z79899 Other long term (current) drug therapy: Secondary | ICD-10-CM | POA: Diagnosis not present

## 2015-05-05 DIAGNOSIS — M0600 Rheumatoid arthritis without rheumatoid factor, unspecified site: Secondary | ICD-10-CM | POA: Diagnosis not present

## 2015-05-05 DIAGNOSIS — E119 Type 2 diabetes mellitus without complications: Secondary | ICD-10-CM | POA: Diagnosis not present

## 2015-05-05 DIAGNOSIS — M0589 Other rheumatoid arthritis with rheumatoid factor of multiple sites: Secondary | ICD-10-CM | POA: Diagnosis not present

## 2015-05-05 DIAGNOSIS — E780 Pure hypercholesterolemia: Secondary | ICD-10-CM | POA: Diagnosis not present

## 2015-05-05 DIAGNOSIS — I10 Essential (primary) hypertension: Secondary | ICD-10-CM | POA: Diagnosis not present

## 2015-05-09 ENCOUNTER — Ambulatory Visit: Payer: Medicare Other | Admitting: Podiatry

## 2015-05-09 DIAGNOSIS — E119 Type 2 diabetes mellitus without complications: Secondary | ICD-10-CM | POA: Diagnosis not present

## 2015-05-11 DIAGNOSIS — M25562 Pain in left knee: Secondary | ICD-10-CM | POA: Diagnosis not present

## 2015-05-11 DIAGNOSIS — M1711 Unilateral primary osteoarthritis, right knee: Secondary | ICD-10-CM | POA: Diagnosis not present

## 2015-05-11 DIAGNOSIS — M25561 Pain in right knee: Secondary | ICD-10-CM | POA: Diagnosis not present

## 2015-05-13 ENCOUNTER — Ambulatory Visit (INDEPENDENT_AMBULATORY_CARE_PROVIDER_SITE_OTHER): Payer: Medicare Other | Admitting: Podiatry

## 2015-05-13 ENCOUNTER — Encounter: Payer: Self-pay | Admitting: Podiatry

## 2015-05-13 VITALS — BP 127/86 | HR 66 | Resp 12

## 2015-05-13 DIAGNOSIS — E11621 Type 2 diabetes mellitus with foot ulcer: Secondary | ICD-10-CM | POA: Diagnosis not present

## 2015-05-13 DIAGNOSIS — R52 Pain, unspecified: Secondary | ICD-10-CM

## 2015-05-13 DIAGNOSIS — L97529 Non-pressure chronic ulcer of other part of left foot with unspecified severity: Secondary | ICD-10-CM

## 2015-05-13 DIAGNOSIS — M79673 Pain in unspecified foot: Secondary | ICD-10-CM

## 2015-05-13 DIAGNOSIS — B351 Tinea unguium: Secondary | ICD-10-CM | POA: Diagnosis not present

## 2015-05-13 DIAGNOSIS — E1140 Type 2 diabetes mellitus with diabetic neuropathy, unspecified: Secondary | ICD-10-CM | POA: Diagnosis not present

## 2015-05-13 NOTE — Patient Instructions (Signed)
Monitor for any signs/symptoms of infection. Call the office immediately if any occur or go directly to the emergency room. Call with any questions/concerns.  

## 2015-05-16 NOTE — Progress Notes (Signed)
Patient ID: [name], female   DOB: 1928-11-14, 81 y.o.   MRN: [id number]  Subjective: 81 year old female presents the office today for follow-up evaluation of the wound to the bottom of her left foot. She states the skin over the wound has become thicker. She denies any redness or drainage on the area. She has noticed some dark discoloration within the central aspect. She denies any surrounding redness or drainage. Just as is her nails are thick, elongated, painful and she is unable to herself. No other complaints at this time. Denies any systemic complaints as fevers, chills, nausea, vomiting.  Objective: AAO 3, NAD DP/PT pulses palpable, CRT less than 3 seconds Left foot submetatarsal one is hyperkeratotic lesion. There appears to be some dried blood within the central portion of the callus. This callus appears to be a remnant of the previous blister. Upon debridement there is a small superficial granular ulceration submetatarsal 1. There is no probe to bone, undermining, tunneling. There is no swelling erythema, ascending cellulitis, fluctuance, crepitus, malodor, drainage. There is no increase in warmth to the foot. No edema to the foot. There is a significant HAV deformity present and there is been a previous amputation of the second toe. No other open lesions or pre-ulcer lesions identified. There is no pain with calf compression, swelling, warmth, erythema.  Assessment: 81 year old female left foot sub-metatarsal 1 pre-ulcerative callus; multiple digital deformities  Plan: -Treatment options discussed including all alternatives, risks, and complications -Lesion which we debrided without complication. At today's appointment there is an underlying ulceration. The wound was debrided to healthy, bleeding, granular wound base. Antibiotic ointment was placed over the wound followed by dry sterile dressing. Continue daily dressing changes. Continue with offloading pads. -Nail sharply  debrided 9 without complication/bleeding. -She has not been wearing diabetic shoes that she's been awaiting approval from her primary care/concerns. -Continue to monitor closely for any skin breakdown or any signs of infection. Call the office immediately if any are to occur. -Follow-up in 3 weeks or sooner if any problems arise. In the meantime, encouraged to call the office with any questions, concerns, change in symptoms.   Celesta Gentile, DPM

## 2015-05-18 ENCOUNTER — Ambulatory Visit: Payer: Medicare Other | Admitting: Podiatry

## 2015-05-20 DIAGNOSIS — L821 Other seborrheic keratosis: Secondary | ICD-10-CM | POA: Diagnosis not present

## 2015-05-23 ENCOUNTER — Ambulatory Visit: Payer: Medicare Other | Admitting: Podiatry

## 2015-06-09 DIAGNOSIS — W19XXXA Unspecified fall, initial encounter: Secondary | ICD-10-CM | POA: Diagnosis not present

## 2015-06-09 DIAGNOSIS — T148 Other injury of unspecified body region: Secondary | ICD-10-CM | POA: Diagnosis not present

## 2015-06-09 DIAGNOSIS — M6281 Muscle weakness (generalized): Secondary | ICD-10-CM | POA: Diagnosis not present

## 2015-06-09 DIAGNOSIS — Z23 Encounter for immunization: Secondary | ICD-10-CM | POA: Diagnosis not present

## 2015-06-10 ENCOUNTER — Ambulatory Visit (INDEPENDENT_AMBULATORY_CARE_PROVIDER_SITE_OTHER): Payer: Medicare Other | Admitting: Podiatry

## 2015-06-10 ENCOUNTER — Encounter: Payer: Self-pay | Admitting: Podiatry

## 2015-06-10 VITALS — BP 117/63 | HR 76 | Temp 96.4°F | Resp 12

## 2015-06-10 DIAGNOSIS — L89891 Pressure ulcer of other site, stage 1: Secondary | ICD-10-CM

## 2015-06-10 DIAGNOSIS — E1140 Type 2 diabetes mellitus with diabetic neuropathy, unspecified: Secondary | ICD-10-CM | POA: Diagnosis not present

## 2015-06-10 DIAGNOSIS — L97529 Non-pressure chronic ulcer of other part of left foot with unspecified severity: Secondary | ICD-10-CM

## 2015-06-10 NOTE — Patient Instructions (Signed)
Continue daily dressing changes. Monitor for any signs/symptoms of infection. Call the office immediately if any occur or go directly to the emergency room. Call with any questions/concerns.  

## 2015-06-15 NOTE — Progress Notes (Signed)
Patient ID: [name], female   DOB: [birth year], 81 y.o.   MRN: [id number]  Subjective: 81 year old female presents the office today for follow-up evaluation of the wound to the bottom of her left foot. She has noticed some bloody drainage coming from the wound however she denies any pus. She states the callus continues to get thicker overlying the area. She states the areas painful with pressure. She denies any surrounding redness or red streaks. No other complaints at this time in no acute changes. She denies any systemic complaints such as fevers, chills, nausea, vomiting. No calf pain, chest pain, shows the breath.  Objective: AAO 3, NAD DP/PT pulses palpable, CRT less than 3 seconds Protective sensation decreased with Simms Weinstein monofilament. Left foot submetatarsal one is a hyperkeratotic lesion. Upon debridement there is an annular ulceration measuring 0.3 x 0.2 cm in the superficial. There is no probing, undermining, tunneling. There is no swelling erythema, ascending cellulitis, fluctuance, crepitus, malodor, drainage/purulence. 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 Multiple digital deformities are present. There is no pain with calf compression, swelling, warmth, erythema.  Assessment: 81 year old female left foot sub-metatarsal 1 ulceration  Plan: -Treatment options discussed including all alternatives, risks, and complications -Wound sharply debrided to healthy, bleeding, granular wound base. Iodosorb was applied followed by dry sterile dressing. Recommend to continue daily dressing changes. She can continue with antibiotic ointment dressing changes. Offloading pads were dispensed. -Monitor for any clinical signs or symptoms of infection and directed to call the office immediately should any occur or go to the  ER. -She is still waiting on diabetic shoes.. -Continue to monitor closely for any skin breakdown or any signs of infection. Call the office immediately if any are to occur. -Follow-up in 2 weeks or sooner if any problems arise. In the meantime, encouraged to call the office with any questions, concerns, change in symptoms.   Celesta Gentile, DPM

## 2015-06-23 DIAGNOSIS — M6281 Muscle weakness (generalized): Secondary | ICD-10-CM | POA: Diagnosis not present

## 2015-06-23 DIAGNOSIS — M79604 Pain in right leg: Secondary | ICD-10-CM | POA: Diagnosis not present

## 2015-06-23 DIAGNOSIS — R262 Difficulty in walking, not elsewhere classified: Secondary | ICD-10-CM | POA: Diagnosis not present

## 2015-06-27 ENCOUNTER — Encounter: Payer: Self-pay | Admitting: Podiatry

## 2015-06-27 ENCOUNTER — Ambulatory Visit (INDEPENDENT_AMBULATORY_CARE_PROVIDER_SITE_OTHER): Payer: Medicare Other | Admitting: Podiatry

## 2015-06-27 VITALS — BP 141/75 | HR 83 | Resp 16

## 2015-06-27 DIAGNOSIS — E1140 Type 2 diabetes mellitus with diabetic neuropathy, unspecified: Secondary | ICD-10-CM

## 2015-06-27 DIAGNOSIS — L97529 Non-pressure chronic ulcer of other part of left foot with unspecified severity: Secondary | ICD-10-CM

## 2015-06-27 DIAGNOSIS — E11621 Type 2 diabetes mellitus with foot ulcer: Secondary | ICD-10-CM | POA: Diagnosis not present

## 2015-06-29 DIAGNOSIS — M25562 Pain in left knee: Secondary | ICD-10-CM | POA: Diagnosis not present

## 2015-06-29 DIAGNOSIS — M1712 Unilateral primary osteoarthritis, left knee: Secondary | ICD-10-CM | POA: Diagnosis not present

## 2015-06-29 NOTE — Progress Notes (Signed)
Patient ID: [name], female   DOB: [birth year], 81 y.o.   MRN: [id number]  Subjective: 81 year old female presents the office today for follow-up evaluation of the wound to the bottom of her left foot. She states the callus continues and it is painful particularly weightbearing pressure. She denies a drainage from the area and denies any redness or red streaks. She does not apply bandage daily to the area.  She still awaiting diabetic shoes. No other complaints at this time in no acute changes. She denies any systemic complaints such as fevers, chills, nausea, vomiting. No calf pain, chest pain, shows the breath.  Objective: AAO 3, NAD DP/PT pulses palpable, CRT less than 3 seconds Protective sensation decreased with Simms Weinstein monofilament. Left foot submetatarsal one is a hyperkeratotic lesion. Upon debridement there is an annular ulceration measuring 0.2 x 0.2 cm in the superficial. There is no probing, undermining, tunneling. There is no surrounding erythema, ascending cellulitis, fluctuance, crepitus, malodor, drainage/purulence. 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 Multiple digital deformities are present. There is no pain with calf compression, swelling, warmth, erythema.  Assessment: 81 year old female left foot sub-metatarsal 1 ulceration  Plan: -Treatment options discussed including all alternatives, risks, and complications -Wound sharply debrided to healthy, bleeding, granular wound base. Iodosorb was applied followed by dry sterile dressing. Recommend to continue daily dressing changes. She can continue with antibiotic ointment dressing changes. Offloading pads were dispensed. Discussed with her that she needs to perform daily dressing changes as she has not been doing this. -Monitor for any clinical signs or  symptoms of infection and directed to call the office immediately should any occur or go to the ER. -She is still waiting on diabetic shoes.. -Continue to monitor closely for any skin breakdown or any signs of infection. Call the office immediately if any are to occur. -Follow-up in 3 weeks or sooner if any problems arise. In the meantime, encouraged to call the office with any questions, concerns, change in symptoms.   Celesta Gentile, DPM

## 2015-07-11 DIAGNOSIS — R2681 Unsteadiness on feet: Secondary | ICD-10-CM | POA: Diagnosis not present

## 2015-07-11 DIAGNOSIS — E1149 Type 2 diabetes mellitus with other diabetic neurological complication: Secondary | ICD-10-CM | POA: Diagnosis not present

## 2015-07-11 DIAGNOSIS — M21961 Unspecified acquired deformity of right lower leg: Secondary | ICD-10-CM | POA: Diagnosis not present

## 2015-07-11 DIAGNOSIS — M21962 Unspecified acquired deformity of left lower leg: Secondary | ICD-10-CM | POA: Diagnosis not present

## 2015-07-18 ENCOUNTER — Encounter: Payer: Self-pay | Admitting: Podiatry

## 2015-07-18 ENCOUNTER — Ambulatory Visit (INDEPENDENT_AMBULATORY_CARE_PROVIDER_SITE_OTHER): Payer: Medicare Other | Admitting: Podiatry

## 2015-07-18 VITALS — BP 118/70 | HR 80 | Resp 18

## 2015-07-18 DIAGNOSIS — E11621 Type 2 diabetes mellitus with foot ulcer: Secondary | ICD-10-CM

## 2015-07-18 DIAGNOSIS — L97529 Non-pressure chronic ulcer of other part of left foot with unspecified severity: Secondary | ICD-10-CM

## 2015-07-18 DIAGNOSIS — M79604 Pain in right leg: Secondary | ICD-10-CM | POA: Diagnosis not present

## 2015-07-18 DIAGNOSIS — R262 Difficulty in walking, not elsewhere classified: Secondary | ICD-10-CM | POA: Diagnosis not present

## 2015-07-18 DIAGNOSIS — M6281 Muscle weakness (generalized): Secondary | ICD-10-CM | POA: Diagnosis not present

## 2015-07-18 DIAGNOSIS — E1140 Type 2 diabetes mellitus with diabetic neuropathy, unspecified: Secondary | ICD-10-CM

## 2015-07-19 NOTE — Progress Notes (Signed)
Patient ID: [name], female   DOB: [birth year], 81 y.o.   MRN: [id number]  Subjective: Patient presents the office for follow up evaluation of left foot submetatarsal 1 ulceration. She states that she continues to apply Neosporin and a bandage over one area daily. She denies any drainage or pus. She denies any redness or red streaks. Denies any systemic complaints such as fevers, chills, nausea, vomiting. No calf pain, chest pain, Semmes of breath. No other complaints at this time. She is still awaiting diabetic shoes approval.  Objective: AAO 3, NAD DP/PT pulses palpable 2/4, CRT less than 3 seconds Protective sensation decreased with Simms Weinstein monofilament Left foot to metatarsal 1 hyperkeratotic lesion. Upon debridement lesion there is a very small superficial pinpoint opening in the wound appears to be significantly improved compared to last appointment. There is no probing, undermining, tunneling. There is no surrounding erythema, ascending saline disc, fluctuance, crepitus, malodor, drainage/purulence. No other open lesions or pre-ulcer lesions identified bilaterally. There is a significant HAV and hammertoe contractures present bilaterally with digital deformities. No pain with calf compression, swelling, warmth, erythema.  Assessment: 81 year old female with resolving left submetatarsal 1 ulceration  Plan: -Treatment options discussed including all alternatives, risks, and complications -Lesion was sharply debrided without, complications/swelling. Appointment was placed followed by a dressing. Continue daily dressing changes. -Awaiting diabetic shoes approval. I cemented paperwork today as well for precertification. -Monitor for any clinical signs or symptoms of infection and directed to call the office immediately should any occur or go to the ER. -Follow-up in 3 weeks or sooner if any problems arise. In the meantime, encouraged to call the office with any questions,  concerns, change in symptoms.   Celesta Gentile, DPM

## 2015-07-20 DIAGNOSIS — M79604 Pain in right leg: Secondary | ICD-10-CM | POA: Diagnosis not present

## 2015-07-20 DIAGNOSIS — M6281 Muscle weakness (generalized): Secondary | ICD-10-CM | POA: Diagnosis not present

## 2015-07-20 DIAGNOSIS — R262 Difficulty in walking, not elsewhere classified: Secondary | ICD-10-CM | POA: Diagnosis not present

## 2015-07-25 DIAGNOSIS — M79604 Pain in right leg: Secondary | ICD-10-CM | POA: Diagnosis not present

## 2015-07-25 DIAGNOSIS — M6281 Muscle weakness (generalized): Secondary | ICD-10-CM | POA: Diagnosis not present

## 2015-07-25 DIAGNOSIS — R262 Difficulty in walking, not elsewhere classified: Secondary | ICD-10-CM | POA: Diagnosis not present

## 2015-07-27 DIAGNOSIS — R262 Difficulty in walking, not elsewhere classified: Secondary | ICD-10-CM | POA: Diagnosis not present

## 2015-07-27 DIAGNOSIS — M6281 Muscle weakness (generalized): Secondary | ICD-10-CM | POA: Diagnosis not present

## 2015-07-27 DIAGNOSIS — M79604 Pain in right leg: Secondary | ICD-10-CM | POA: Diagnosis not present

## 2015-08-02 DIAGNOSIS — R262 Difficulty in walking, not elsewhere classified: Secondary | ICD-10-CM | POA: Diagnosis not present

## 2015-08-02 DIAGNOSIS — M6281 Muscle weakness (generalized): Secondary | ICD-10-CM | POA: Diagnosis not present

## 2015-08-02 DIAGNOSIS — M79604 Pain in right leg: Secondary | ICD-10-CM | POA: Diagnosis not present

## 2015-08-08 ENCOUNTER — Ambulatory Visit: Payer: Medicare Other | Admitting: Podiatry

## 2015-08-08 DIAGNOSIS — M79604 Pain in right leg: Secondary | ICD-10-CM | POA: Diagnosis not present

## 2015-08-08 DIAGNOSIS — R262 Difficulty in walking, not elsewhere classified: Secondary | ICD-10-CM | POA: Diagnosis not present

## 2015-08-08 DIAGNOSIS — M6281 Muscle weakness (generalized): Secondary | ICD-10-CM | POA: Diagnosis not present

## 2015-08-09 ENCOUNTER — Encounter: Payer: Self-pay | Admitting: Podiatry

## 2015-08-09 ENCOUNTER — Ambulatory Visit (INDEPENDENT_AMBULATORY_CARE_PROVIDER_SITE_OTHER): Payer: Medicare Other | Admitting: Podiatry

## 2015-08-09 VITALS — BP 119/79 | HR 78 | Resp 18

## 2015-08-09 DIAGNOSIS — L89891 Pressure ulcer of other site, stage 1: Secondary | ICD-10-CM | POA: Diagnosis not present

## 2015-08-09 DIAGNOSIS — L97529 Non-pressure chronic ulcer of other part of left foot with unspecified severity: Principal | ICD-10-CM

## 2015-08-09 DIAGNOSIS — E11621 Type 2 diabetes mellitus with foot ulcer: Secondary | ICD-10-CM

## 2015-08-09 DIAGNOSIS — E119 Type 2 diabetes mellitus without complications: Secondary | ICD-10-CM | POA: Diagnosis not present

## 2015-08-10 DIAGNOSIS — M1711 Unilateral primary osteoarthritis, right knee: Secondary | ICD-10-CM | POA: Diagnosis not present

## 2015-08-10 DIAGNOSIS — M170 Bilateral primary osteoarthritis of knee: Secondary | ICD-10-CM | POA: Diagnosis not present

## 2015-08-11 DIAGNOSIS — E11621 Type 2 diabetes mellitus with foot ulcer: Secondary | ICD-10-CM | POA: Insufficient documentation

## 2015-08-11 DIAGNOSIS — L97529 Non-pressure chronic ulcer of other part of left foot with unspecified severity: Principal | ICD-10-CM

## 2015-08-11 NOTE — Progress Notes (Signed)
Patient ID: [name], female   DOB: 1929/04/13, 81 y.o.   MRN: [id number]  Subjective: Patient presents the office for follow up evaluation of left foot submetatarsal 1 ulceration. She states that she continues to apply Neosporin and a bandage over one area daily. She denies any drainage or pus. She denies any redness or red streaks. Denies any systemic complaints such as fevers, chills, nausea, vomiting. No calf pain, chest pain, Semmes of breath. No other complaints at this time. She is still awaiting diabetic shoes.  Objective: AAO 3, NAD DP/PT pulses palpable 2/4, CRT less than 3 seconds Protective sensation decreased with Simms Weinstein monofilament Left foot to metatarsal 1 hyperkeratotic lesion. Upon debridement lesion there is a very small superficial pinpoint opening in the wound appears to continue to be improving. There is no probing, undermining, tunneling. There is no surrounding erythema, ascending cellulitis, fluctuance, crepitus, malodor, drainage/purulence. No other open lesions or pre-ulcer lesions identified bilaterally. There is a significant HAV and hammertoe contractures present bilaterally with digital deformities. No pain with calf compression, swelling, warmth, erythema.  Assessment: 81 year old female with resolving left submetatarsal 1 ulceration  Plan: -Treatment options discussed including all alternatives, risks, and complications -Lesion was sharply debrided without, complications/swelling. Appointment was placed followed by a dressing. Continue daily dressing changes. -Awaiting diabetic shoes  -Monitor for any clinical signs or symptoms of infection and directed to call the office immediately should any occur or go to the ER. -Follow-up in 4 weeks or sooner if any problems arise. In the meantime, encouraged to call the office with any questions, concerns, change in symptoms.   Celesta Gentile, DPM

## 2015-08-15 DIAGNOSIS — E7800 Pure hypercholesterolemia, unspecified: Secondary | ICD-10-CM | POA: Diagnosis not present

## 2015-08-15 DIAGNOSIS — E1149 Type 2 diabetes mellitus with other diabetic neurological complication: Secondary | ICD-10-CM | POA: Diagnosis not present

## 2015-08-15 DIAGNOSIS — I10 Essential (primary) hypertension: Secondary | ICD-10-CM | POA: Diagnosis not present

## 2015-08-30 DIAGNOSIS — I10 Essential (primary) hypertension: Secondary | ICD-10-CM | POA: Diagnosis not present

## 2015-08-30 DIAGNOSIS — E7800 Pure hypercholesterolemia, unspecified: Secondary | ICD-10-CM | POA: Diagnosis not present

## 2015-08-30 DIAGNOSIS — R42 Dizziness and giddiness: Secondary | ICD-10-CM | POA: Diagnosis not present

## 2015-08-30 DIAGNOSIS — N390 Urinary tract infection, site not specified: Secondary | ICD-10-CM | POA: Diagnosis not present

## 2015-09-05 ENCOUNTER — Ambulatory Visit (INDEPENDENT_AMBULATORY_CARE_PROVIDER_SITE_OTHER): Payer: Medicare Other | Admitting: Podiatry

## 2015-09-05 ENCOUNTER — Encounter: Payer: Self-pay | Admitting: Podiatry

## 2015-09-05 DIAGNOSIS — E7800 Pure hypercholesterolemia, unspecified: Secondary | ICD-10-CM | POA: Diagnosis not present

## 2015-09-05 DIAGNOSIS — L97529 Non-pressure chronic ulcer of other part of left foot with unspecified severity: Secondary | ICD-10-CM

## 2015-09-05 DIAGNOSIS — E1140 Type 2 diabetes mellitus with diabetic neuropathy, unspecified: Secondary | ICD-10-CM

## 2015-09-05 DIAGNOSIS — E119 Type 2 diabetes mellitus without complications: Secondary | ICD-10-CM | POA: Diagnosis not present

## 2015-09-05 DIAGNOSIS — M810 Age-related osteoporosis without current pathological fracture: Secondary | ICD-10-CM | POA: Diagnosis not present

## 2015-09-05 DIAGNOSIS — Z0000 Encounter for general adult medical examination without abnormal findings: Secondary | ICD-10-CM | POA: Diagnosis not present

## 2015-09-05 DIAGNOSIS — E11621 Type 2 diabetes mellitus with foot ulcer: Secondary | ICD-10-CM

## 2015-09-05 DIAGNOSIS — I10 Essential (primary) hypertension: Secondary | ICD-10-CM | POA: Diagnosis not present

## 2015-09-05 NOTE — Progress Notes (Signed)
Patient ID: [name], female   DOB: 07-30-1929, 81 y.o.   MRN: [id number]  Subjective: Patient presents the office for follow up evaluation of left foot submetatarsal 1 ulceration/callus. She states that she continues to apply Neosporin and a bandage over one area daily. She denies any drainage or pus. She states the area looks much better. The area does continue to be painful as the callus builds. She is to get her diabetic shoes tomorrow. Denies any systemic complaints such as fevers, chills, nausea, vomiting. No calf pain, chest pain, shortness of breath. No other complaints at this time. She is still awaiting diabetic shoes.  Objective: AAO 3, NAD DP/PT pulses palpable 2/4, CRT less than 3 seconds Protective sensation decreased with Simms Weinstein monofilament Left foot to metatarsal 1 hyperkeratotic lesion. Upon debridement lesion there is a very small superficial pinpoint opening in the wound appears to continue to be improving. It appears to be almost an abrasion type lesion. There is no probing, undermining, tunneling. There is no surrounding erythema, ascending cellulitis, fluctuance, crepitus, malodor, drainage/purulence. No other open lesions or pre-ulcer lesions identified bilaterally. There is a significant HAV and hammertoe contractures present bilaterally with digital deformities. No pain with calf compression, swelling, warmth, erythema.  Assessment: 81 year old female with resolving left submetatarsal 1 ulceration  Plan: -Treatment options discussed including all alternatives, risks, and complications -Lesion was sharply debrided without, complications/bleeding. Antibiotic was placed followed by a dressing. Continue daily dressing changes. -Awaiting diabetic shoes  -Monitor for any clinical signs or symptoms of infection and directed to call the office immediately should any occur or go to the ER. -Follow-up in 4 weeks or sooner if any problems arise. In the meantime,  encouraged to call the office with any questions, concerns, change in symptoms.  Hopefully after this appointment, back to a 3 months schedule.  Celesta Gentile, DPM

## 2015-09-07 ENCOUNTER — Other Ambulatory Visit: Payer: Self-pay | Admitting: Podiatry

## 2015-09-08 DIAGNOSIS — M810 Age-related osteoporosis without current pathological fracture: Secondary | ICD-10-CM | POA: Diagnosis not present

## 2015-09-08 DIAGNOSIS — Z79899 Other long term (current) drug therapy: Secondary | ICD-10-CM | POA: Diagnosis not present

## 2015-09-08 DIAGNOSIS — E1149 Type 2 diabetes mellitus with other diabetic neurological complication: Secondary | ICD-10-CM | POA: Diagnosis not present

## 2015-09-08 DIAGNOSIS — M0579 Rheumatoid arthritis with rheumatoid factor of multiple sites without organ or systems involvement: Secondary | ICD-10-CM | POA: Diagnosis not present

## 2015-09-08 DIAGNOSIS — L570 Actinic keratosis: Secondary | ICD-10-CM | POA: Diagnosis not present

## 2015-09-08 DIAGNOSIS — Z1212 Encounter for screening for malignant neoplasm of rectum: Secondary | ICD-10-CM | POA: Diagnosis not present

## 2015-09-21 DIAGNOSIS — C44729 Squamous cell carcinoma of skin of left lower limb, including hip: Secondary | ICD-10-CM | POA: Diagnosis not present

## 2015-09-21 DIAGNOSIS — S50312A Abrasion of left elbow, initial encounter: Secondary | ICD-10-CM | POA: Diagnosis not present

## 2015-10-03 ENCOUNTER — Ambulatory Visit: Payer: Medicare Other | Admitting: Podiatry

## 2015-10-06 ENCOUNTER — Ambulatory Visit: Payer: Medicare Other | Admitting: Podiatry

## 2015-10-11 ENCOUNTER — Encounter: Payer: Self-pay | Admitting: Podiatry

## 2015-10-11 ENCOUNTER — Ambulatory Visit (INDEPENDENT_AMBULATORY_CARE_PROVIDER_SITE_OTHER): Payer: Medicare Other | Admitting: Podiatry

## 2015-10-11 VITALS — BP 128/68 | HR 80 | Resp 16

## 2015-10-11 DIAGNOSIS — L84 Corns and callosities: Secondary | ICD-10-CM

## 2015-10-11 DIAGNOSIS — M79673 Pain in unspecified foot: Secondary | ICD-10-CM

## 2015-10-11 DIAGNOSIS — R52 Pain, unspecified: Secondary | ICD-10-CM

## 2015-10-11 DIAGNOSIS — B351 Tinea unguium: Secondary | ICD-10-CM

## 2015-10-11 DIAGNOSIS — E1140 Type 2 diabetes mellitus with diabetic neuropathy, unspecified: Secondary | ICD-10-CM

## 2015-10-11 NOTE — Progress Notes (Signed)
Patient ID: [name], female   DOB: 09-03-1929, 82 y.o.   MRN: [id number]  Subjective: Patient presents the office for follow up evaluation of left foot submetatarsal 1 ulceration/callus. She did get diabetic shoes however she does not like them. She's been wearing an insert inside of another see the [place] which seems to help. She does have that the callus does continue be painful. She denies any drainage or pus from the area. No open sore that she knows of.  Denies any systemic complaints such as fevers, chills, nausea, vomiting. No calf pain, chest pain, shortness of breath. No other complaints at this time. She is still awaiting diabetic shoes.  Objective: AAO 3, NAD DP/PT pulses palpable 2/4, CRT less than 3 seconds Protective sensation decreased with Simms Weinstein monofilament Left foot to metatarsal 1 hyperkeratotic lesion. Upon debridement lesion the underlying wound appears to be healed although it is pre-ulcerative. There is no drainage or pus. No swelling erythema, ascending cellulitis, fluctuance, crepitus or malodor. Nails are hypertrophic, dystrophic, brittle, discolored, elongated 10. No swelling erythema or drainage. There is tenderness to palpation to nails 1-5 bilaterally. No other open lesions or pre-ulcer lesions identified bilaterally. There is a significant HAV and hammertoe contractures present bilaterally with digital deformities. No pain with calf compression, swelling, warmth, erythema.  Assessment: 82 year old female with resolving left submetatarsal 1 ulceration, resolved. Ulcerative callus; symptomatic onychomycosis.   Plan: -Treatment options discussed including all alternatives, risks, and complications -Lesion was sharply debrided without, complications/bleeding. -Recommended her to return to [employer] as her shoes are not helping. I did add an offloading pad to the inter-that she had today. -Nails debrided x 10 without complications or bleeding.   -Monitor for any clinical signs or symptoms of infection and directed to call the office immediately should any occur or go to the ER. -Follow-up in 6 weeks or sooner if any problems arise. In the meantime, encouraged to call the office with any questions, concerns, change in symptoms.    Celesta Gentile, DPM

## 2015-10-24 DIAGNOSIS — I10 Essential (primary) hypertension: Secondary | ICD-10-CM | POA: Diagnosis not present

## 2015-10-24 DIAGNOSIS — E7800 Pure hypercholesterolemia, unspecified: Secondary | ICD-10-CM | POA: Diagnosis not present

## 2015-10-24 DIAGNOSIS — E119 Type 2 diabetes mellitus without complications: Secondary | ICD-10-CM | POA: Diagnosis not present

## 2015-10-27 ENCOUNTER — Encounter: Payer: Self-pay | Admitting: Gastroenterology

## 2015-11-17 ENCOUNTER — Ambulatory Visit: Payer: Medicare Other | Admitting: Podiatry

## 2015-11-18 ENCOUNTER — Ambulatory Visit (INDEPENDENT_AMBULATORY_CARE_PROVIDER_SITE_OTHER): Payer: Medicare Other | Admitting: Podiatry

## 2015-11-18 ENCOUNTER — Ambulatory Visit: Payer: Medicare Other | Admitting: Podiatry

## 2015-11-18 ENCOUNTER — Encounter: Payer: Self-pay | Admitting: Podiatry

## 2015-11-18 DIAGNOSIS — L97529 Non-pressure chronic ulcer of other part of left foot with unspecified severity: Secondary | ICD-10-CM | POA: Diagnosis not present

## 2015-11-18 DIAGNOSIS — E11621 Type 2 diabetes mellitus with foot ulcer: Secondary | ICD-10-CM | POA: Diagnosis not present

## 2015-11-18 DIAGNOSIS — B351 Tinea unguium: Secondary | ICD-10-CM

## 2015-11-18 MED ORDER — SILVER SULFADIAZINE 1 % EX CREA: 1.0000 "application " | TOPICAL_CREAM | Freq: Every day | CUTANEOUS | Status: DC

## 2015-11-21 ENCOUNTER — Ambulatory Visit: Payer: Medicare Other | Admitting: Podiatry

## 2015-11-22 NOTE — Progress Notes (Signed)
Patient ID: [name], female   DOB: 1928/10/11, 82 y.o.   MRN: [id number]  Subjective: Patient presents the office for follow up evaluation of left foot submetatarsal 1 ulceration/callus.  She states that she has contacted [employer] and she is getting scheduled to have a modification of her insert. She is asking if I can modify the intensive that she brought today for the meantime. She says the area does continue to become sore as the callus builds backup on the left foot. Denies any drainage or possibly surrounding redness or swelling. She says is her nails are long causing pressure hurting of the toes. Denies any drainage or pus coming from the toenails and denies any swelling to her feet.  Objective: AAO 3, NAD DP/PT pulses palpable 2/4, CRT less than 3 seconds Protective sensation decreased with Simms Weinstein monofilament Left foot to metatarsal 1 hyperkeratotic lesion. Upon debridement today there is a small superficial granular wound which does not probe, undermining or tunneling. There is no drainage or pus. No surrounding erythema, ascending synovitis, fluctuance, crepitus, malodor, drainage or pus. There is no clinical signs of infection. The nails appear to be hypertrophic, dystrophic, brittle, discolored, elongated on  Second third toenails on both feet.  No swelling erythema or drainage from the toenails. No other open lesions or pre-ulcer lesions identified bilaterally. There is a significant HAV and hammertoe contractures present bilaterally with digital deformities. No pain with calf compression, swelling, warmth, erythema.  Assessment: 82 year old female with left submetatarsal 1 ulceration due to toe deformities  Plan: -Treatment options discussed including all alternatives, risks, and complications - hyperkeratotic lesion the left foot was debrided to reveal small superficial noninfected wound. Continue daily dressing changes. I did modify her inserts to help take  pressure off this area as well. Continue with daily dressing changes. She can use antibiotic ointment for now. - Symptomatic nails were debrided without complications or bleeding. -Monitor for any clinical signs or symptoms of infection and directed to call the office immediately should any occur or go to the ER. -Follow-up in 6 weeks or sooner if any problems arise. In the meantime, encouraged to call the office with any questions, concerns, change in symptoms.    Celesta Gentile, DPM

## 2015-11-28 DIAGNOSIS — M069 Rheumatoid arthritis, unspecified: Secondary | ICD-10-CM | POA: Diagnosis not present

## 2015-11-28 DIAGNOSIS — Z79899 Other long term (current) drug therapy: Secondary | ICD-10-CM | POA: Diagnosis not present

## 2015-11-28 DIAGNOSIS — M150 Primary generalized (osteo)arthritis: Secondary | ICD-10-CM | POA: Diagnosis not present

## 2015-11-30 DIAGNOSIS — L821 Other seborrheic keratosis: Secondary | ICD-10-CM | POA: Diagnosis not present

## 2015-11-30 DIAGNOSIS — Z85828 Personal history of other malignant neoplasm of skin: Secondary | ICD-10-CM | POA: Diagnosis not present

## 2015-12-26 DIAGNOSIS — Z79899 Other long term (current) drug therapy: Secondary | ICD-10-CM | POA: Diagnosis not present

## 2015-12-29 DIAGNOSIS — Z79899 Other long term (current) drug therapy: Secondary | ICD-10-CM | POA: Diagnosis not present

## 2015-12-29 DIAGNOSIS — M150 Primary generalized (osteo)arthritis: Secondary | ICD-10-CM | POA: Diagnosis not present

## 2015-12-29 DIAGNOSIS — M79643 Pain in unspecified hand: Secondary | ICD-10-CM | POA: Diagnosis not present

## 2015-12-29 DIAGNOSIS — M069 Rheumatoid arthritis, unspecified: Secondary | ICD-10-CM | POA: Diagnosis not present

## 2015-12-30 ENCOUNTER — Ambulatory Visit (INDEPENDENT_AMBULATORY_CARE_PROVIDER_SITE_OTHER): Payer: Medicare Other | Admitting: Podiatry

## 2015-12-30 ENCOUNTER — Encounter: Payer: Self-pay | Admitting: Podiatry

## 2015-12-30 DIAGNOSIS — L84 Corns and callosities: Secondary | ICD-10-CM | POA: Diagnosis not present

## 2015-12-30 DIAGNOSIS — E1140 Type 2 diabetes mellitus with diabetic neuropathy, unspecified: Secondary | ICD-10-CM

## 2016-01-02 ENCOUNTER — Ambulatory Visit: Payer: Medicare Other | Admitting: Podiatry

## 2016-01-05 NOTE — Progress Notes (Signed)
Patient ID: [name], female   DOB: [birth year], 82 y.o.   MRN: [id number]  Subjective: Patient presents the office for follow up evaluation of left foot submetatarsal 1 ulceration/callus.  She states of the calluses gotten thick again and is painful. Denies any redness or drainage. No acute changes since last appointment and no other complaints.   Objective: AAO 3, NAD DP/PT pulses palpable 2/4, CRT less than 3 seconds Protective sensation decreased with Simms Weinstein monofilament Left foot to metatarsal 1 and 3 hyperkeratotic lesion. Upon debridement there is no underlying ulceration, drainage or other signs of infection. There is no edema to the foot. No malodor. No other open lesions or pre-ulcer lesions identified bilaterally. There is a significant HAV and hammertoe contractures present bilaterally with digital deformities. No pain with calf compression, swelling, warmth, erythema.  Assessment: 82 year old female with left submetatarsal 1 ulceration due to toe deformities  Plan: -Treatment options discussed including all alternatives, risks, and complications -Hyperkeratotic lesion the left foot was debrided x 2 without complications or bleeding.  -Monitor for any clinical signs or symptoms of infection and directed to call the office immediately should any occur or go to the ER. -Follow-up in 6-8 weeks or sooner if any problems arise. In the meantime, encouraged to call the office with any questions, concerns, change in symptoms.    Celesta Gentile, DPM

## 2016-01-10 DIAGNOSIS — H353121 Nonexudative age-related macular degeneration, left eye, early dry stage: Secondary | ICD-10-CM | POA: Diagnosis not present

## 2016-01-10 DIAGNOSIS — H353111 Nonexudative age-related macular degeneration, right eye, early dry stage: Secondary | ICD-10-CM | POA: Diagnosis not present

## 2016-01-10 DIAGNOSIS — E119 Type 2 diabetes mellitus without complications: Secondary | ICD-10-CM | POA: Diagnosis not present

## 2016-01-10 DIAGNOSIS — H52203 Unspecified astigmatism, bilateral: Secondary | ICD-10-CM | POA: Diagnosis not present

## 2016-01-12 DIAGNOSIS — I10 Essential (primary) hypertension: Secondary | ICD-10-CM | POA: Diagnosis not present

## 2016-01-12 DIAGNOSIS — E7800 Pure hypercholesterolemia, unspecified: Secondary | ICD-10-CM | POA: Diagnosis not present

## 2016-01-12 DIAGNOSIS — E119 Type 2 diabetes mellitus without complications: Secondary | ICD-10-CM | POA: Diagnosis not present

## 2016-01-19 DIAGNOSIS — E119 Type 2 diabetes mellitus without complications: Secondary | ICD-10-CM | POA: Diagnosis not present

## 2016-01-30 DIAGNOSIS — M05741 Rheumatoid arthritis with rheumatoid factor of right hand without organ or systems involvement: Secondary | ICD-10-CM | POA: Diagnosis not present

## 2016-01-30 DIAGNOSIS — M05742 Rheumatoid arthritis with rheumatoid factor of left hand without organ or systems involvement: Secondary | ICD-10-CM | POA: Diagnosis not present

## 2016-01-30 DIAGNOSIS — M79641 Pain in right hand: Secondary | ICD-10-CM | POA: Diagnosis not present

## 2016-01-30 DIAGNOSIS — M79642 Pain in left hand: Secondary | ICD-10-CM | POA: Diagnosis not present

## 2016-02-01 DIAGNOSIS — M79641 Pain in right hand: Secondary | ICD-10-CM | POA: Diagnosis not present

## 2016-02-01 DIAGNOSIS — M79642 Pain in left hand: Secondary | ICD-10-CM | POA: Diagnosis not present

## 2016-02-15 DIAGNOSIS — M1711 Unilateral primary osteoarthritis, right knee: Secondary | ICD-10-CM | POA: Diagnosis not present

## 2016-02-15 DIAGNOSIS — M170 Bilateral primary osteoarthritis of knee: Secondary | ICD-10-CM | POA: Diagnosis not present

## 2016-02-17 ENCOUNTER — Ambulatory Visit (INDEPENDENT_AMBULATORY_CARE_PROVIDER_SITE_OTHER): Payer: Medicare Other | Admitting: Podiatry

## 2016-02-17 DIAGNOSIS — L84 Corns and callosities: Secondary | ICD-10-CM | POA: Diagnosis not present

## 2016-02-17 DIAGNOSIS — B351 Tinea unguium: Secondary | ICD-10-CM

## 2016-02-17 DIAGNOSIS — M79676 Pain in unspecified toe(s): Secondary | ICD-10-CM

## 2016-02-17 DIAGNOSIS — E1140 Type 2 diabetes mellitus with diabetic neuropathy, unspecified: Secondary | ICD-10-CM | POA: Diagnosis not present

## 2016-02-23 NOTE — Progress Notes (Signed)
Patient ID: [name], female   DOB: [birth year], 82 y.o.   MRN: [id number]  Subjective: Patient presents the office for follow up evaluation of left foot submetatarsal 1 callus as well as for thick, painful, elongated at that she cannot trim herself. Denies any surrounding redness or drainage. She has been wearing a diabetic shoes and inserts which is been helping. Denies any redness or drainage. No acute changes since last appointment and no other complaints.   Objective: AAO 3, NAD DP/PT pulses palpable 2/4, CRT less than 3 seconds Protective sensation decreased with Simms Weinstein monofilament Left foot to metatarsal 1 and 3 hyperkeratotic lesion. Upon debridement there is no underlying ulceration, drainage or other signs of infection. There is no edema to the foot. No malodor. Nails are hypertrophic, dystrophic, brittle, discolored, elongated 10. No surrounding redness or drainage. Tenderness to nails 1-5 bilaterally. No other open lesions or pre-ulcer lesions identified bilaterally. There is a significant HAV and hammertoe contractures present bilaterally with digital deformities. No pain with calf compression, swelling, warmth, erythema.  Assessment: 82 year old female with pre-ulcerative lesions, symptomatic onychomycosis.  Plan: -Treatment options discussed including all alternatives, risks, and complications -Hyperkeratotic lesion the left foot was debrided x 2 without complications or bleeding.  -Symptomatic onychomycosis debrided 10 without complications or bleeding -A foot inspection. Monitor for any further skin breakdown or signs of infection. -Monitor for any clinical signs or symptoms of infection and directed to call the office immediately should any occur or go to the ER. -Follow-up in 9 weeks or sooner if any problems arise. In the meantime, encouraged to call the office with any questions, concerns, change in symptoms.    Celesta Gentile, DPM

## 2016-02-27 DIAGNOSIS — M79643 Pain in unspecified hand: Secondary | ICD-10-CM | POA: Diagnosis not present

## 2016-02-27 DIAGNOSIS — Z79899 Other long term (current) drug therapy: Secondary | ICD-10-CM | POA: Diagnosis not present

## 2016-02-27 DIAGNOSIS — M150 Primary generalized (osteo)arthritis: Secondary | ICD-10-CM | POA: Diagnosis not present

## 2016-02-27 DIAGNOSIS — M069 Rheumatoid arthritis, unspecified: Secondary | ICD-10-CM | POA: Diagnosis not present

## 2016-03-05 ENCOUNTER — Ambulatory Visit: Payer: Medicare Other | Admitting: Podiatry

## 2016-03-28 DIAGNOSIS — E1149 Type 2 diabetes mellitus with other diabetic neurological complication: Secondary | ICD-10-CM | POA: Diagnosis not present

## 2016-03-28 DIAGNOSIS — E119 Type 2 diabetes mellitus without complications: Secondary | ICD-10-CM | POA: Diagnosis not present

## 2016-03-28 DIAGNOSIS — M79643 Pain in unspecified hand: Secondary | ICD-10-CM | POA: Diagnosis not present

## 2016-03-28 DIAGNOSIS — I10 Essential (primary) hypertension: Secondary | ICD-10-CM | POA: Diagnosis not present

## 2016-04-13 ENCOUNTER — Ambulatory Visit (INDEPENDENT_AMBULATORY_CARE_PROVIDER_SITE_OTHER): Payer: Medicare Other | Admitting: Podiatry

## 2016-04-13 ENCOUNTER — Encounter: Payer: Self-pay | Admitting: Podiatry

## 2016-04-13 DIAGNOSIS — L89891 Pressure ulcer of other site, stage 1: Secondary | ICD-10-CM

## 2016-04-13 DIAGNOSIS — E11621 Type 2 diabetes mellitus with foot ulcer: Secondary | ICD-10-CM

## 2016-04-13 DIAGNOSIS — L97529 Non-pressure chronic ulcer of other part of left foot with unspecified severity: Principal | ICD-10-CM

## 2016-04-15 NOTE — Progress Notes (Signed)
Patient ID: KYLY BAERT, female   DOB: 1928/12/26, 82 y.o.   MRN: [id number]  Subjective: Patient presents the office for concerns of a wound left foot submetatarsal one. [place] and she [place] to[place] skin peeling and she developed a wound. Denies any drainage or pus and denies any surrounding redness or red streaking.  Denies any redness or drainage. No acute changes since last appointment and no other complaints.   Objective: AAO 3, NAD DP/PT pulses palpable 2/4, CRT less than 3 seconds Protective sensation decreased with Simms Weinstein monofilament Left foot to metatarsal  1 hyperkeratotic lesion with underlying ulceration. Upon department there is a superficial granular ulceration measuring a proximal 1.5 x 1 cm and then there is no probing, undermining or tunneling. No swelling erythema, ascending cellulitis , fluctuance, crepitus, malodor. There is no clinical signs of infection. Pre-ulcerative lesion left foot some midtarsal 3. No underlying ulceration, drainage or other signs of infection. No other open lesions or pre-ulcerative lesions identified at this time.  There is a significant HAV and hammertoe contractures present bilaterally with digital deformities. No pain with calf compression, swelling, warmth, erythema.  Assessment: 82 year old female with left foot ulceration submet 1  Plan: -Treatment options discussed including all alternatives, risks, and , complications. -Hyperkeratotic lesion/ulceration was debrided to healthy, granular tissue with a scalpel. Silvadene was applied followed by dressing. Continue daily dressing changes. Continue offloading pads which are dispensed today. Present lesion left foot second metatarsal 3 debrided without complications or bleeding. -Follow-up in 2 weeks or sooner if needed. Monitor for signs or symptoms of infection to the ER should any occur.

## 2016-04-16 DIAGNOSIS — M810 Age-related osteoporosis without current pathological fracture: Secondary | ICD-10-CM | POA: Diagnosis not present

## 2016-04-16 DIAGNOSIS — R079 Chest pain, unspecified: Secondary | ICD-10-CM | POA: Diagnosis not present

## 2016-04-16 DIAGNOSIS — M069 Rheumatoid arthritis, unspecified: Secondary | ICD-10-CM | POA: Diagnosis not present

## 2016-04-16 DIAGNOSIS — M545 Low back pain: Secondary | ICD-10-CM | POA: Diagnosis not present

## 2016-04-16 DIAGNOSIS — S3992XA Unspecified injury of lower back, initial encounter: Secondary | ICD-10-CM | POA: Diagnosis not present

## 2016-04-16 DIAGNOSIS — Z79899 Other long term (current) drug therapy: Secondary | ICD-10-CM | POA: Diagnosis not present

## 2016-04-16 DIAGNOSIS — M79643 Pain in unspecified hand: Secondary | ICD-10-CM | POA: Diagnosis not present

## 2016-04-17 ENCOUNTER — Other Ambulatory Visit: Payer: Self-pay | Admitting: Rheumatology

## 2016-04-17 DIAGNOSIS — M549 Dorsalgia, unspecified: Secondary | ICD-10-CM

## 2016-04-17 DIAGNOSIS — R9389 Abnormal findings on diagnostic imaging of other specified body structures: Secondary | ICD-10-CM

## 2016-04-19 DIAGNOSIS — M810 Age-related osteoporosis without current pathological fracture: Secondary | ICD-10-CM | POA: Diagnosis not present

## 2016-04-19 DIAGNOSIS — M79643 Pain in unspecified hand: Secondary | ICD-10-CM | POA: Diagnosis not present

## 2016-04-19 DIAGNOSIS — M069 Rheumatoid arthritis, unspecified: Secondary | ICD-10-CM | POA: Diagnosis not present

## 2016-04-30 ENCOUNTER — Ambulatory Visit: Payer: Medicare Other | Admitting: Podiatry

## 2016-05-07 ENCOUNTER — Encounter: Payer: Self-pay | Admitting: Podiatry

## 2016-05-07 ENCOUNTER — Ambulatory Visit (INDEPENDENT_AMBULATORY_CARE_PROVIDER_SITE_OTHER): Payer: Medicare Other | Admitting: Podiatry

## 2016-05-07 DIAGNOSIS — L84 Corns and callosities: Secondary | ICD-10-CM

## 2016-05-07 DIAGNOSIS — E119 Type 2 diabetes mellitus without complications: Secondary | ICD-10-CM | POA: Diagnosis not present

## 2016-05-07 DIAGNOSIS — E1140 Type 2 diabetes mellitus with diabetic neuropathy, unspecified: Secondary | ICD-10-CM

## 2016-05-07 DIAGNOSIS — I10 Essential (primary) hypertension: Secondary | ICD-10-CM | POA: Diagnosis not present

## 2016-05-07 DIAGNOSIS — L97529 Non-pressure chronic ulcer of other part of left foot with unspecified severity: Principal | ICD-10-CM

## 2016-05-07 DIAGNOSIS — K219 Gastro-esophageal reflux disease without esophagitis: Secondary | ICD-10-CM | POA: Diagnosis not present

## 2016-05-07 DIAGNOSIS — E11621 Type 2 diabetes mellitus with foot ulcer: Secondary | ICD-10-CM

## 2016-05-07 DIAGNOSIS — L89891 Pressure ulcer of other site, stage 1: Secondary | ICD-10-CM | POA: Diagnosis not present

## 2016-05-07 NOTE — Progress Notes (Signed)
Patient ID: MILLENNIA DABU, female   DOB: October 12, 1928, 82 y.o.   MRN: [id number]  Subjective: Patient presents the office for follow valuation of the wound submetatarsal 1 as well as for her calluses to her third toes on both feet which are painful. She denies any drainage or swelling or any redness. The areas are painful pressure. Denies any redness or drainage. No acute changes since last appointment and no other complaints.   Objective: AAO 3, NAD DP/PT pulses palpable 2/4, CRT less than 3 seconds Protective sensation decreased with Simms Weinstein monofilament Left foot to metatarsal  1 hyperkeratotic lesion with underlying ulceration. Upon department there is a superficial granular ulceration measuring a proximal 1 x 0.8 cm and then there is no probing, undermining or tunneling. No surrounding erythema, ascending cellulitis, fluctuance, crepitus, malodor. There are no clinical signs of infection. 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 No ongoing ulceration, drainage or other signs of infection today. No other open lesions or pre-ulcerative lesions identified at this time. There is a significant HAV and hammertoe contractures present bilaterally with digital deformities. No pain with calf compression, swelling, warmth, erythema.  Assessment: 82 year old  female with left foot ulceration submet 1; pre-ulcerative calluses 2  Plan: -Treatment options discussed including all alternatives, risks, and , complications. -Wound was debrided and the granular wound base. Recommend continue with antibiotic clinic dressing changes on this for now. Also feels her calluses are debrided 2 without complications or bleeding. Offloading pads dispensed again today. -Continue monitor for any signs or symptoms of infection physical the office immediately should any occur or go to the ER. -Follow-up in 3 weeks or sooner if any problems arise. In the meantime, encouraged to  call the office with any questions, concerns, change in symptoms.   Celesta Gentile, DPM

## 2016-05-14 ENCOUNTER — Ambulatory Visit: Payer: Medicare Other | Admitting: Podiatry

## 2016-05-16 DIAGNOSIS — M25561 Pain in right knee: Secondary | ICD-10-CM | POA: Diagnosis not present

## 2016-05-16 DIAGNOSIS — M1711 Unilateral primary osteoarthritis, right knee: Secondary | ICD-10-CM | POA: Diagnosis not present

## 2016-05-21 ENCOUNTER — Encounter: Payer: Self-pay | Admitting: Podiatry

## 2016-05-21 ENCOUNTER — Ambulatory Visit (INDEPENDENT_AMBULATORY_CARE_PROVIDER_SITE_OTHER): Payer: Medicare Other | Admitting: Podiatry

## 2016-05-21 DIAGNOSIS — L97529 Non-pressure chronic ulcer of other part of left foot with unspecified severity: Secondary | ICD-10-CM

## 2016-05-21 DIAGNOSIS — E11621 Type 2 diabetes mellitus with foot ulcer: Secondary | ICD-10-CM

## 2016-05-21 DIAGNOSIS — E1140 Type 2 diabetes mellitus with diabetic neuropathy, unspecified: Secondary | ICD-10-CM

## 2016-05-21 DIAGNOSIS — L84 Corns and callosities: Secondary | ICD-10-CM | POA: Diagnosis not present

## 2016-05-22 DIAGNOSIS — E119 Type 2 diabetes mellitus without complications: Secondary | ICD-10-CM | POA: Diagnosis not present

## 2016-05-22 DIAGNOSIS — I10 Essential (primary) hypertension: Secondary | ICD-10-CM | POA: Diagnosis not present

## 2016-05-29 NOTE — Progress Notes (Signed)
Patient ID: [name], female   DOB: [birth year], 82 y.o.   MRN: [id number]  Subjective: Patient presents the office for follow valuation of the wound submetatarsal 1 as well as for her calluses to her third toes on both feet which are painful. She states the wound is getting better. She denies any drainage or swelling or any redness. No acute changes since last appointment and no other complaints.   Objective: AAO 3, NAD DP/PT pulses palpable 2/4, CRT less than 3 seconds Protective sensation decreased with Simms Weinstein monofilament Left foot to metatarsal  1 ulceration with hyperkerotic periwound. Upon department there is a superficial granular ulceration measuring a proximal 0.8 x 0.6 cm and then there is no probing, undermining or tunneling. No surrounding erythema, ascending cellulitis, fluctuance, crepitus, malodor. There are no clinical signs of infection. 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 No other underlying ulceration, drainage or other signs of infection today. No other open lesions or pre-ulcerative lesions identified at this time. There is a significant HAV and hammertoe contractures present bilaterally with digital deformities. No pain with calf compression, swelling, warmth, erythema.  Assessment: 82 year old  female with left foot ulceration submet 1; pre-ulcerative calluses   Plan: -Treatment options discussed including all alternatives, risks, and , complications. -Wound was sharply debrided and the granular wound base. Recommend continue with antibiotic clinic dressing changes on this for now. Also feels her calluses are debrided 2 without complications or bleeding. Offloading pads dispensed again today. -Continue monitor for any signs or symptoms of infection physical the office immediately should any occur or go to the ER. -Follow-up in 3 weeks or sooner if any problems arise. In the meantime, encouraged to call the office  with any questions, concerns, change in symptoms.   Celesta Gentile, DPM

## 2016-06-08 DIAGNOSIS — Z23 Encounter for immunization: Secondary | ICD-10-CM | POA: Diagnosis not present

## 2016-06-11 ENCOUNTER — Ambulatory Visit (INDEPENDENT_AMBULATORY_CARE_PROVIDER_SITE_OTHER): Payer: Medicare Other | Admitting: Podiatry

## 2016-06-11 ENCOUNTER — Encounter: Payer: Self-pay | Admitting: Podiatry

## 2016-06-11 DIAGNOSIS — L97529 Non-pressure chronic ulcer of other part of left foot with unspecified severity: Principal | ICD-10-CM

## 2016-06-11 DIAGNOSIS — E11621 Type 2 diabetes mellitus with foot ulcer: Secondary | ICD-10-CM

## 2016-06-11 DIAGNOSIS — E1140 Type 2 diabetes mellitus with diabetic neuropathy, unspecified: Secondary | ICD-10-CM

## 2016-06-11 DIAGNOSIS — L89891 Pressure ulcer of other site, stage 1: Secondary | ICD-10-CM

## 2016-06-18 NOTE — Progress Notes (Signed)
Patient ID: CALIXTA STAEBLER, female   DOB: August 20, 1929, 82 y.o.   MRN: [id number]  Subjective: Patient presents the office for follow valuation of the wound submetatarsal 1 as well as for her calluses to her third toes on both feet which are painful. She states the wound is getting better and denies any drainage or swelling or any redness. She states that she believes that the wound started after she is going to the beach a couple months ago. No acute changes since last appointment and no other complaints.  Objective: AAO 3, NAD DP/PT pulses palpable 2/4, CRT less than 3 seconds Protective sensation decreased with Simms Weinstein monofilament Left foot to metatarsal  1 ulceration with hyperkerotic periwound. Upon department there is a superficial granular ulceration measuring a proximal 0.6 x 0.6 cm and then there is no probing, undermining or tunneling. No surrounding erythema, ascending cellulitis, fluctuance, crepitus, malodor. There are no clinical signs of infection. 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 No other underlying ulceration, drainage or other signs of infection today. No other open lesions or pre-ulcerative lesions identified at this time. There is a significant HAV and hammertoe contractures present bilaterally with digital deformities. No pain with calf compression, swelling, warmth, erythema.  Assessment: 82 year old  female with left foot ulceration submet 1; pre-ulcerative calluses   Plan: -Treatment options discussed including all alternatives, risks, and , complications. -Wound was sharply debrided and the granular wound base. Recommend continue with antibiotic ointment dressing changes on this for now. Pre-ulcerative calluses are debrided 2 without complications or bleeding. Offloading pads dispensed again today. -Continue monitor for any signs or symptoms of infection physical the office immediately should any occur or go to the  ER. -Follow-up in 3 weeks or sooner if any problems arise. In the meantime, encouraged to call the office with any questions, concerns, change in symptoms.   Celesta Gentile, DPM

## 2016-06-25 ENCOUNTER — Encounter: Payer: Self-pay | Admitting: Podiatry

## 2016-06-25 ENCOUNTER — Ambulatory Visit (INDEPENDENT_AMBULATORY_CARE_PROVIDER_SITE_OTHER): Payer: Medicare Other | Admitting: Podiatry

## 2016-06-25 DIAGNOSIS — L97522 Non-pressure chronic ulcer of other part of left foot with fat layer exposed: Secondary | ICD-10-CM

## 2016-06-25 DIAGNOSIS — E11621 Type 2 diabetes mellitus with foot ulcer: Secondary | ICD-10-CM

## 2016-06-25 DIAGNOSIS — L03119 Cellulitis of unspecified part of limb: Secondary | ICD-10-CM | POA: Diagnosis not present

## 2016-06-25 DIAGNOSIS — L89891 Pressure ulcer of other site, stage 1: Secondary | ICD-10-CM

## 2016-06-25 DIAGNOSIS — L02619 Cutaneous abscess of unspecified foot: Secondary | ICD-10-CM

## 2016-06-25 MED ORDER — CEPHALEXIN 500 MG PO CAPS: 500.0000 mg | ORAL_CAPSULE | Freq: Three times a day (TID) | ORAL | 2 refills | Status: DC

## 2016-06-26 ENCOUNTER — Telehealth: Payer: Self-pay | Admitting: *Deleted

## 2016-06-26 MED ORDER — CADEXOMER IODINE 0.9 % EX GEL: CUTANEOUS | 0 refills | Status: DC

## 2016-06-26 NOTE — Telephone Encounter (Signed)
Yes, continue iodosorb gel

## 2016-06-26 NOTE — Telephone Encounter (Addendum)
[facility] asked if pt is to continue Iodosorb gel. Informed pt Dr. Jacqualyn Posey wanted her to continue the Iodosorb gel, and I would send to [facility], for them to check the cost to her.

## 2016-06-26 NOTE — Addendum Note (Signed)
Addended by: Harriett Sine D on: 06/26/2016 11:45 AM   Modules accepted: Orders

## 2016-06-29 ENCOUNTER — Telehealth: Payer: Self-pay | Admitting: Podiatry

## 2016-06-29 NOTE — Telephone Encounter (Signed)
She can do the silvadene. Although she told me the iodosorb worked well and that is why we decided to do it.

## 2016-06-29 NOTE — Telephone Encounter (Signed)
Pt called asking about the medication that was sent in. It is 170 dollars a tube and pt thinks she still has some from last time that she did not use because it did not work. She is using the silvadene. She also is leaving to go out of town Sunday and the pharmacy would not be able to get it in until Monday. Is there something else she can have or can she pick up from a pharmacy where she is going. Please advise pt of what she should do.

## 2016-06-29 NOTE — Telephone Encounter (Addendum)
Pt states iodosorb is cost $170.00, has some she didn't use because she thinks it didn't help and now has expired. Pt states she is using Silvadene and is going out of town today. Pt called states her church trip was changed to Wednesday and she will be able to get the medication. Pt asked when it will be in, I told pt she would have to check with her pharmacy. Pt states she thought she was talking to the pharmacy. Pt called states the iodosorb is not covered by her insurance and will cost $180.00. Informed pt Dr. Jacqualyn Posey states use the Silvadene, pt states thank you.

## 2016-07-05 NOTE — Progress Notes (Signed)
Patient ID: [name], female   DOB: 1929-09-17, 82 y.o.   MRN: [id number]  Subjective: Patient presents the office for follow evaluation of the wound submetatarsal 1 as well as for her calluses to her third toes on both feet which are painful. She states the wound has been getting better but has been somewhat painful. Denies any redness or drainage or any red streaks or any increase in swelling. No other complaints at this time. Denies any acute changes. Denies any systemic complaints such as fevers, chills, nausea, vomiting. No calf pain, chest pain, shows left.  Objective: AAO 3, NAD DP/PT pulses palpable 2/4, CRT less than 3 seconds Protective sensation decreased with Simms Weinstein monofilament Left foot to metatarsal  1 ulceration with hyperkerotic periwound. Upon debrided there is bare to be a area of blister which Of the wound. Upon debridement there is a small amount of pus expressed from this area. After debridement there is a granular wound underneath the area of the blister but again there is no probing, undermining or tunneling. There is no swelling erythema or ascending cellulitis. No fluctuance or crepitus or any malodor. There are no other open lesions identified bilaterally. There is pre-ulcerative lesions to the toes however not open at this time. No other areas of edema, erythema, drainage or any other signs of infection. There is a significant HAV and hammertoe contractures present bilaterally with digital deformities. No pain with calf compression, swelling, warmth, erythema.  Assessment: 81 year old  female with left foot ulceration submet 1 ulceration with localized subcutaneous abscess  Plan: -Treatment options discussed including all alternatives, risks, and , complications. -Wound on the left lower sharply debrided today. The area of blister, abscess was debrided today. After debridement there was no further purulence identified. The drainage was cultured today.  Will start Keflex. Intermedic wound was applied followed by a bandage. Continue daily dressing changes home as well as offloading pads. Possibly signs or symptoms of worsening infection of the ER should any occur call the office. Follow-up as scheduled or sooner if needed. Call any questions concerning the meantime.  Celesta Gentile, DPM

## 2016-07-09 ENCOUNTER — Encounter: Payer: Self-pay | Admitting: Podiatry

## 2016-07-09 ENCOUNTER — Ambulatory Visit (INDEPENDENT_AMBULATORY_CARE_PROVIDER_SITE_OTHER): Payer: Medicare Other | Admitting: Podiatry

## 2016-07-09 ENCOUNTER — Ambulatory Visit: Payer: Medicare Other | Admitting: Podiatry

## 2016-07-09 VITALS — BP 113/85 | HR 83 | Resp 16

## 2016-07-09 DIAGNOSIS — E1140 Type 2 diabetes mellitus with diabetic neuropathy, unspecified: Secondary | ICD-10-CM

## 2016-07-09 DIAGNOSIS — L89891 Pressure ulcer of other site, stage 1: Secondary | ICD-10-CM | POA: Diagnosis not present

## 2016-07-09 DIAGNOSIS — H353122 Nonexudative age-related macular degeneration, left eye, intermediate dry stage: Secondary | ICD-10-CM | POA: Diagnosis not present

## 2016-07-09 DIAGNOSIS — H353112 Nonexudative age-related macular degeneration, right eye, intermediate dry stage: Secondary | ICD-10-CM | POA: Diagnosis not present

## 2016-07-09 DIAGNOSIS — L97522 Non-pressure chronic ulcer of other part of left foot with fat layer exposed: Principal | ICD-10-CM

## 2016-07-09 DIAGNOSIS — E11621 Type 2 diabetes mellitus with foot ulcer: Secondary | ICD-10-CM | POA: Diagnosis not present

## 2016-07-09 DIAGNOSIS — Z79899 Other long term (current) drug therapy: Secondary | ICD-10-CM | POA: Diagnosis not present

## 2016-07-10 NOTE — Progress Notes (Signed)
Patient ID: [name], female   DOB: 07-03-1929, 82 y.o.   MRN: [id number]  Subjective: Patient presents the office for follow evaluation of the wound submetatarsal 1 as well as for her calluses to her third toes. She was at the beach last week and she feels that she has not been as diligent about her dressing and she has missed several doses of the antibiotic. She denies any redness or drainage coming from the wound. She denies any new open sores.Denies any acute changes. Denies any systemic complaints such as fevers, chills, nausea, vomiting. No calf pain, chest pain, shows left.  Objective: AAO 3, NAD DP/PT pulses palpable 2/4, CRT less than 3 seconds Protective sensation decreased with Simms Weinstein monofilament Left foot to metatarsal  1 ulceration with hyperkerotic periwound. Fibro-granular wound base. After debridement to the wound measures 1.1 x 1 x 0.3 stem years. There is no probing to bone, undermining or tunneling. There is no surrounding erythema, ascending cellulitis. There is no fluctuance or crepitus there is no malodor. Pre-ulcerative hyperkeratotic lesion to the third toe. Upon debridement no underlying ulceration, drainage or signs of infection. There is a significant HAV and hammertoe contractures present bilaterally with digital deformities. No pain with calf compression, swelling, warmth, erythema.  Assessment: 82 year old  female with left foot ulceration submet 1 ulceration with no signs of abscess today.   Plan: -Treatment options discussed including all alternatives, risks, and , complications. -Wound on the left lower sharply debrided today. Continue iodosorb dressing changes daily. She has been using silvadene daily and feels it is not helping. Monitor for signs or symptoms of infection and to go to the ER should any occur. Continue offloading pads.  -Pre-ulcerative callus debrided without complications or bleeding. -Follow-up as scheduled or sooner if any  problems arise. In the meantime, encouraged to call the office with any questions, concerns, change in symptoms.   Celesta Gentile, DPM

## 2016-07-13 ENCOUNTER — Ambulatory Visit: Payer: Medicare Other | Admitting: Podiatry

## 2016-07-17 DIAGNOSIS — E119 Type 2 diabetes mellitus without complications: Secondary | ICD-10-CM | POA: Diagnosis not present

## 2016-07-23 ENCOUNTER — Ambulatory Visit (INDEPENDENT_AMBULATORY_CARE_PROVIDER_SITE_OTHER): Payer: Medicare Other | Admitting: Podiatry

## 2016-07-23 ENCOUNTER — Ambulatory Visit: Payer: Medicare Other | Admitting: Podiatry

## 2016-07-23 DIAGNOSIS — E08621 Diabetes mellitus due to underlying condition with foot ulcer: Secondary | ICD-10-CM

## 2016-07-23 DIAGNOSIS — I10 Essential (primary) hypertension: Secondary | ICD-10-CM | POA: Diagnosis not present

## 2016-07-23 DIAGNOSIS — E7800 Pure hypercholesterolemia, unspecified: Secondary | ICD-10-CM | POA: Diagnosis not present

## 2016-07-23 DIAGNOSIS — I70245 Atherosclerosis of native arteries of left leg with ulceration of other part of foot: Secondary | ICD-10-CM

## 2016-07-23 DIAGNOSIS — L97522 Non-pressure chronic ulcer of other part of left foot with fat layer exposed: Secondary | ICD-10-CM | POA: Diagnosis not present

## 2016-07-23 DIAGNOSIS — E0843 Diabetes mellitus due to underlying condition with diabetic autonomic (poly)neuropathy: Secondary | ICD-10-CM

## 2016-07-23 DIAGNOSIS — L97509 Non-pressure chronic ulcer of other part of unspecified foot with unspecified severity: Secondary | ICD-10-CM

## 2016-07-23 DIAGNOSIS — E1143 Type 2 diabetes mellitus with diabetic autonomic (poly)neuropathy: Secondary | ICD-10-CM

## 2016-07-23 DIAGNOSIS — E119 Type 2 diabetes mellitus without complications: Secondary | ICD-10-CM | POA: Diagnosis not present

## 2016-07-25 DIAGNOSIS — R358 Other polyuria: Secondary | ICD-10-CM | POA: Diagnosis not present

## 2016-07-25 DIAGNOSIS — N390 Urinary tract infection, site not specified: Secondary | ICD-10-CM | POA: Diagnosis not present

## 2016-07-27 ENCOUNTER — Ambulatory Visit: Payer: Medicare Other | Admitting: Podiatry

## 2016-07-29 NOTE — Progress Notes (Signed)
Subjective:  Patient with a history of diabetes mellitus and rheumatoid arthritis presents today for follow-up evaluation of a diabetic ulcer to the sub-first MPJ left foot. Patient states that the ulcer is bleeding. Patient does believe that the ulcer has improved patient presents today for further treatment and evaluation    Objective/Physical Exam General: The patient is alert and oriented x3 in no acute distress.  Dermatology: Ulceration noted to the sub-first MPJ left foot measuring [phone] centimeters. To the noted ulceration there is no eschar there is minimal slough fibrin and necrotic tissue. Granulation tissue and wound red. There is currently no exposed bone muscle-tendon ligament or joint. There is no malodor. Periwound integrity is intact.   Skin is warm, dry and supple bilateral lower extremities.  Vascular: Palpable pedal pulses bilaterally. No edema or erythema noted. Capillary refill within normal limits.  Neurological: Epicritic and protective threshold absent bilaterally.   Musculoskeletal Exam: Severe rheumatoid arthritis with degenerative changes noted to the bilateral feet  Assessment: #1 diabetes mellitus with manifestations of peripheral neuropathy #2 severe rheumatoid arthritis bilateral lower extremities #3 ulcer sub-first MPJ left foot secondary to diabetes mellitus  Plan of Care:  #1 Patient was evaluated. #2 medically necessary excisional debridement including subcutaneous tissue was performed using a tissue nipper and a chisel blade. Excisional debridement of all the necrotic nonviable tissue down to healthy bleeding viable tissue was performed with post-debridement measurements same as pre-. #3 wound was cleansed and dry sterile dressing applied. #4 offloading metatarsal pads were dispensed #5 patient is to return to clinic in 2 weeks   Dr. Edrick Kins, [place]

## 2016-08-08 DIAGNOSIS — M25561 Pain in right knee: Secondary | ICD-10-CM | POA: Diagnosis not present

## 2016-08-08 DIAGNOSIS — M1711 Unilateral primary osteoarthritis, right knee: Secondary | ICD-10-CM | POA: Diagnosis not present

## 2016-08-08 DIAGNOSIS — G8929 Other chronic pain: Secondary | ICD-10-CM | POA: Diagnosis not present

## 2016-08-09 DIAGNOSIS — N390 Urinary tract infection, site not specified: Secondary | ICD-10-CM | POA: Diagnosis not present

## 2016-08-10 DIAGNOSIS — M069 Rheumatoid arthritis, unspecified: Secondary | ICD-10-CM | POA: Diagnosis not present

## 2016-08-10 DIAGNOSIS — M810 Age-related osteoporosis without current pathological fracture: Secondary | ICD-10-CM | POA: Diagnosis not present

## 2016-08-10 DIAGNOSIS — M79643 Pain in unspecified hand: Secondary | ICD-10-CM | POA: Diagnosis not present

## 2016-08-10 DIAGNOSIS — Z79899 Other long term (current) drug therapy: Secondary | ICD-10-CM | POA: Diagnosis not present

## 2016-08-13 ENCOUNTER — Ambulatory Visit (INDEPENDENT_AMBULATORY_CARE_PROVIDER_SITE_OTHER): Payer: Medicare Other | Admitting: Podiatry

## 2016-08-13 ENCOUNTER — Telehealth: Payer: Self-pay | Admitting: *Deleted

## 2016-08-13 DIAGNOSIS — L97522 Non-pressure chronic ulcer of other part of left foot with fat layer exposed: Secondary | ICD-10-CM

## 2016-08-13 DIAGNOSIS — L84 Corns and callosities: Secondary | ICD-10-CM

## 2016-08-13 NOTE — Telephone Encounter (Signed)
Dr. Jacqualyn Posey ordered Calcium Alginate with Silver for left sub 1st metatarsal ulcer L97.522, measurement 2. X 1.5 x 0.2 cm with moderate drainage from Prism. Orders faxed with demographics.

## 2016-08-21 DIAGNOSIS — E119 Type 2 diabetes mellitus without complications: Secondary | ICD-10-CM | POA: Diagnosis not present

## 2016-08-21 DIAGNOSIS — E039 Hypothyroidism, unspecified: Secondary | ICD-10-CM | POA: Diagnosis not present

## 2016-08-21 NOTE — Progress Notes (Signed)
Patient ID: [name], female   DOB: 1929-06-22, 82 y.o.   MRN: [id number]  Subjective: [name] the office for follow evaluation of the wound submetatarsal 1. She does that she is been on her feet more. She is getting frustrated with the wound not closing as fast as she would light. She denies any redness or swelling or any drainage coming from the wound. She doesn't the wound is Somewhat Bigger As She Is Been on Her Feet More. She is continue daily dressing changes with iodosorb. Denies any systemic complaints such as fevers, chills, nausea, vomiting. No calf pain, chest pain, shows left.  Objective: AAO 3, NAD DP/PT pulses palpable 2/4, CRT less than 3 seconds Protective sensation decreased with Simms Weinstein monofilament Left foot to metatarsal  1 ulceration with hyperkerotic periwound. Fibro-granular wound base. After debridement to the wound measures 1.5 x 1 x 0.3 cm in the wound appears to be granular . There is no probing to bone, undermining or tunneling. There is no surrounding erythema, ascending cellulitis. There is no fluctuance or crepitus there is no malodor. Pre-ulcerative hyperkeratotic lesion to the third toe. Upon debridement no underlying ulceration, drainage or signs of infection. There is a significant HAV and hammertoe contractures present bilaterally with digital deformities. No pain with calf compression, swelling, warmth, erythema.  Assessment: 82 year old  female with left foot ulceration submet 1 ulceration with worsening of the wound   Plan: -Treatment options discussed including all alternatives, risks, and , complications. -Wound on the left lower sharply debrided today Today with a tissue nipper as well as a scalpel. -Recommended silver alginate dressing changes. Ordered through [employer]. Further offloading pads were dispensed and applied to help with the wound healing. -She's had new diabetic shoes however she has significant rheumatoid  arthritis as well which is a contributor to the nonhealing of the ulceration. Further offloading of the area today. -At this point as the wound is benign and healing is actually worsening we'll refer to the wound care center for second opinion and further evaluation. There is no clinical signs of infection today. The wound has never probed deep -Follow-up as scheduled or sooner if any problems arise. In the meantime, encouraged to call the office with any questions, concerns, change in symptoms.   [name], DPM

## 2016-08-23 DIAGNOSIS — E119 Type 2 diabetes mellitus without complications: Secondary | ICD-10-CM | POA: Diagnosis not present

## 2016-08-24 ENCOUNTER — Encounter: Payer: Self-pay | Admitting: Podiatry

## 2016-08-24 ENCOUNTER — Ambulatory Visit (INDEPENDENT_AMBULATORY_CARE_PROVIDER_SITE_OTHER): Payer: Medicare Other | Admitting: Podiatry

## 2016-08-24 DIAGNOSIS — L97522 Non-pressure chronic ulcer of other part of left foot with fat layer exposed: Secondary | ICD-10-CM

## 2016-08-24 DIAGNOSIS — L84 Corns and callosities: Secondary | ICD-10-CM

## 2016-08-24 DIAGNOSIS — E1140 Type 2 diabetes mellitus with diabetic neuropathy, unspecified: Secondary | ICD-10-CM | POA: Diagnosis not present

## 2016-08-24 DIAGNOSIS — Q828 Other specified congenital malformations of skin: Secondary | ICD-10-CM

## 2016-08-30 NOTE — Progress Notes (Signed)
Patient ID: [name], female   DOB: September 19, 1929, 82 y.o.   MRN: [id number]  Subjective: [name] presents the office for follow evaluation of the wound submetatarsal 1. She states the wound on the bottom of the foot is looking somewhat better but she is still concerned it is still present. She is scheduled for the wound care center next week. She is continue the Silvadene dressing changes daily. The silver alginate dressings have not yet arrived to her house. She denies any drainage or pus and she denies any swelling redness or red streaks or any swelling to her foot. She is continued surgical shoe which she feels helped quite a bit and she's had no pain when wearing the surgical shoe. Denies any systemic complaints such as fevers, chills, nausea, vomiting. No calf pain, chest pain, shows left.  Objective: AAO 3, NAD DP/PT pulses palpable 2/4, CRT less than 3 seconds Protective sensation decreased with Simms Weinstein monofilament Left foot to metatarsal  1 ulceration with hyperkerotic periwound. Fibro-granular wound base. After debridement to the wound measures 1.3 x 0.9 x 0.3 cm in the wound appears to be granular. There is noted to be a small amount of undermining around the wound but there is no probing, tunneling. There is no swelling erythema, ascending cellulitis, fluctuance, crepitus, malodor. Pre-ulcerative hyperkeratotic lesion to the third toe on the lateral distal portion. Upon debridement no underlying ulceration, drainage or any signs of infection. No other open lesions or pre-ulcerative lesions are identified. No pain with calf compression, swelling, warmth, erythema.  Assessment: 82 year old  female with left foot ulceration submet 1 ulceration with worsening of the wound   Plan: -Treatment options discussed including all alternatives, risks, and, complications. -The wound was debrided to granular tissue. Continue Silvadene dressing changes for now. She has an upcoming  appointment with the wound care center next week and I encouraged her to keep this appointment as her wound has worsened. She has noted a deformity to her feet as well which limits her healing and pressure. Continue with surgical shoe with offloading for now. -Hyperkeratotic lesion third toe is debrided today without complications or bleeding. No underlying ulceration. -Continue daily foot inspection. -Follow-up as scheduled or sooner if any problems arise. In the meantime, encouraged to call the office with any questions, concerns, change in symptoms.   [name], DPM

## 2016-08-31 ENCOUNTER — Other Ambulatory Visit: Payer: Self-pay | Admitting: Internal Medicine

## 2016-08-31 ENCOUNTER — Encounter (HOSPITAL_BASED_OUTPATIENT_CLINIC_OR_DEPARTMENT_OTHER): Payer: Medicare Other | Attending: Internal Medicine

## 2016-08-31 ENCOUNTER — Ambulatory Visit (HOSPITAL_COMMUNITY)
Admission: RE | Admit: 2016-08-31 | Discharge: 2016-08-31 | Disposition: A | Discharge status: Home / Self Care | Payer: Medicare Other | Source: Ambulatory Visit | Attending: Internal Medicine | Admitting: Internal Medicine

## 2016-08-31 DIAGNOSIS — L97529 Non-pressure chronic ulcer of other part of left foot with unspecified severity: Secondary | ICD-10-CM | POA: Diagnosis not present

## 2016-08-31 DIAGNOSIS — L97522 Non-pressure chronic ulcer of other part of left foot with fat layer exposed: Secondary | ICD-10-CM

## 2016-08-31 DIAGNOSIS — L97521 Non-pressure chronic ulcer of other part of left foot limited to breakdown of skin: Secondary | ICD-10-CM | POA: Insufficient documentation

## 2016-08-31 DIAGNOSIS — E11621 Type 2 diabetes mellitus with foot ulcer: Secondary | ICD-10-CM | POA: Insufficient documentation

## 2016-08-31 DIAGNOSIS — M05172 Rheumatoid lung disease with rheumatoid arthritis of left ankle and foot: Secondary | ICD-10-CM | POA: Diagnosis not present

## 2016-08-31 DIAGNOSIS — I10 Essential (primary) hypertension: Secondary | ICD-10-CM | POA: Diagnosis not present

## 2016-08-31 DIAGNOSIS — L97523 Non-pressure chronic ulcer of other part of left foot with necrosis of muscle: Secondary | ICD-10-CM | POA: Diagnosis not present

## 2016-08-31 DIAGNOSIS — E1142 Type 2 diabetes mellitus with diabetic polyneuropathy: Secondary | ICD-10-CM | POA: Diagnosis not present

## 2016-08-31 DIAGNOSIS — L84 Corns and callosities: Secondary | ICD-10-CM | POA: Diagnosis not present

## 2016-09-03 DIAGNOSIS — L97423 Non-pressure chronic ulcer of left heel and midfoot with necrosis of muscle: Secondary | ICD-10-CM | POA: Diagnosis not present

## 2016-09-03 DIAGNOSIS — E1142 Type 2 diabetes mellitus with diabetic polyneuropathy: Secondary | ICD-10-CM | POA: Diagnosis not present

## 2016-09-03 DIAGNOSIS — I10 Essential (primary) hypertension: Secondary | ICD-10-CM | POA: Diagnosis not present

## 2016-09-03 DIAGNOSIS — E11621 Type 2 diabetes mellitus with foot ulcer: Secondary | ICD-10-CM | POA: Diagnosis not present

## 2016-09-03 DIAGNOSIS — D649 Anemia, unspecified: Secondary | ICD-10-CM | POA: Diagnosis not present

## 2016-09-03 DIAGNOSIS — E039 Hypothyroidism, unspecified: Secondary | ICD-10-CM | POA: Diagnosis not present

## 2016-09-03 DIAGNOSIS — F329 Major depressive disorder, single episode, unspecified: Secondary | ICD-10-CM | POA: Diagnosis not present

## 2016-09-03 DIAGNOSIS — Z7984 Long term (current) use of oral hypoglycemic drugs: Secondary | ICD-10-CM | POA: Diagnosis not present

## 2016-09-03 DIAGNOSIS — M05172 Rheumatoid lung disease with rheumatoid arthritis of left ankle and foot: Secondary | ICD-10-CM | POA: Diagnosis not present

## 2016-09-03 DIAGNOSIS — K5790 Diverticulosis of intestine, part unspecified, without perforation or abscess without bleeding: Secondary | ICD-10-CM | POA: Diagnosis not present

## 2016-09-07 DIAGNOSIS — E1142 Type 2 diabetes mellitus with diabetic polyneuropathy: Secondary | ICD-10-CM | POA: Diagnosis not present

## 2016-09-07 DIAGNOSIS — L97522 Non-pressure chronic ulcer of other part of left foot with fat layer exposed: Secondary | ICD-10-CM | POA: Diagnosis not present

## 2016-09-07 DIAGNOSIS — M05172 Rheumatoid lung disease with rheumatoid arthritis of left ankle and foot: Secondary | ICD-10-CM | POA: Diagnosis not present

## 2016-09-07 DIAGNOSIS — E11621 Type 2 diabetes mellitus with foot ulcer: Secondary | ICD-10-CM | POA: Diagnosis not present

## 2016-09-07 DIAGNOSIS — L97523 Non-pressure chronic ulcer of other part of left foot with necrosis of muscle: Secondary | ICD-10-CM | POA: Diagnosis not present

## 2016-09-07 DIAGNOSIS — L97521 Non-pressure chronic ulcer of other part of left foot limited to breakdown of skin: Secondary | ICD-10-CM | POA: Diagnosis not present

## 2016-09-07 DIAGNOSIS — I10 Essential (primary) hypertension: Secondary | ICD-10-CM | POA: Diagnosis not present

## 2016-09-10 DIAGNOSIS — M05172 Rheumatoid lung disease with rheumatoid arthritis of left ankle and foot: Secondary | ICD-10-CM | POA: Diagnosis not present

## 2016-09-10 DIAGNOSIS — K5790 Diverticulosis of intestine, part unspecified, without perforation or abscess without bleeding: Secondary | ICD-10-CM | POA: Diagnosis not present

## 2016-09-10 DIAGNOSIS — E1142 Type 2 diabetes mellitus with diabetic polyneuropathy: Secondary | ICD-10-CM | POA: Diagnosis not present

## 2016-09-10 DIAGNOSIS — L97423 Non-pressure chronic ulcer of left heel and midfoot with necrosis of muscle: Secondary | ICD-10-CM | POA: Diagnosis not present

## 2016-09-10 DIAGNOSIS — E11621 Type 2 diabetes mellitus with foot ulcer: Secondary | ICD-10-CM | POA: Diagnosis not present

## 2016-09-10 DIAGNOSIS — I10 Essential (primary) hypertension: Secondary | ICD-10-CM | POA: Diagnosis not present

## 2016-09-12 DIAGNOSIS — E11621 Type 2 diabetes mellitus with foot ulcer: Secondary | ICD-10-CM | POA: Diagnosis not present

## 2016-09-12 DIAGNOSIS — E1142 Type 2 diabetes mellitus with diabetic polyneuropathy: Secondary | ICD-10-CM | POA: Diagnosis not present

## 2016-09-12 DIAGNOSIS — L97423 Non-pressure chronic ulcer of left heel and midfoot with necrosis of muscle: Secondary | ICD-10-CM | POA: Diagnosis not present

## 2016-09-12 DIAGNOSIS — K5790 Diverticulosis of intestine, part unspecified, without perforation or abscess without bleeding: Secondary | ICD-10-CM | POA: Diagnosis not present

## 2016-09-12 DIAGNOSIS — I10 Essential (primary) hypertension: Secondary | ICD-10-CM | POA: Diagnosis not present

## 2016-09-12 DIAGNOSIS — M05172 Rheumatoid lung disease with rheumatoid arthritis of left ankle and foot: Secondary | ICD-10-CM | POA: Diagnosis not present

## 2016-09-14 DIAGNOSIS — E1142 Type 2 diabetes mellitus with diabetic polyneuropathy: Secondary | ICD-10-CM | POA: Diagnosis not present

## 2016-09-14 DIAGNOSIS — I10 Essential (primary) hypertension: Secondary | ICD-10-CM | POA: Diagnosis not present

## 2016-09-14 DIAGNOSIS — R358 Other polyuria: Secondary | ICD-10-CM | POA: Diagnosis not present

## 2016-09-14 DIAGNOSIS — E7800 Pure hypercholesterolemia, unspecified: Secondary | ICD-10-CM | POA: Diagnosis not present

## 2016-09-14 DIAGNOSIS — L97521 Non-pressure chronic ulcer of other part of left foot limited to breakdown of skin: Secondary | ICD-10-CM | POA: Diagnosis not present

## 2016-09-14 DIAGNOSIS — L97523 Non-pressure chronic ulcer of other part of left foot with necrosis of muscle: Secondary | ICD-10-CM | POA: Diagnosis not present

## 2016-09-14 DIAGNOSIS — N390 Urinary tract infection, site not specified: Secondary | ICD-10-CM | POA: Diagnosis not present

## 2016-09-14 DIAGNOSIS — M810 Age-related osteoporosis without current pathological fracture: Secondary | ICD-10-CM | POA: Diagnosis not present

## 2016-09-14 DIAGNOSIS — L97522 Non-pressure chronic ulcer of other part of left foot with fat layer exposed: Secondary | ICD-10-CM | POA: Diagnosis not present

## 2016-09-14 DIAGNOSIS — E11621 Type 2 diabetes mellitus with foot ulcer: Secondary | ICD-10-CM | POA: Diagnosis not present

## 2016-09-14 DIAGNOSIS — M05172 Rheumatoid lung disease with rheumatoid arthritis of left ankle and foot: Secondary | ICD-10-CM | POA: Diagnosis not present

## 2016-09-18 DIAGNOSIS — L97423 Non-pressure chronic ulcer of left heel and midfoot with necrosis of muscle: Secondary | ICD-10-CM | POA: Diagnosis not present

## 2016-09-18 DIAGNOSIS — K5790 Diverticulosis of intestine, part unspecified, without perforation or abscess without bleeding: Secondary | ICD-10-CM | POA: Diagnosis not present

## 2016-09-18 DIAGNOSIS — M05172 Rheumatoid lung disease with rheumatoid arthritis of left ankle and foot: Secondary | ICD-10-CM | POA: Diagnosis not present

## 2016-09-18 DIAGNOSIS — E11621 Type 2 diabetes mellitus with foot ulcer: Secondary | ICD-10-CM | POA: Diagnosis not present

## 2016-09-18 DIAGNOSIS — E1142 Type 2 diabetes mellitus with diabetic polyneuropathy: Secondary | ICD-10-CM | POA: Diagnosis not present

## 2016-09-18 DIAGNOSIS — I10 Essential (primary) hypertension: Secondary | ICD-10-CM | POA: Diagnosis not present

## 2016-09-20 DIAGNOSIS — M05172 Rheumatoid lung disease with rheumatoid arthritis of left ankle and foot: Secondary | ICD-10-CM | POA: Diagnosis not present

## 2016-09-20 DIAGNOSIS — I10 Essential (primary) hypertension: Secondary | ICD-10-CM | POA: Diagnosis not present

## 2016-09-20 DIAGNOSIS — E1142 Type 2 diabetes mellitus with diabetic polyneuropathy: Secondary | ICD-10-CM | POA: Diagnosis not present

## 2016-09-20 DIAGNOSIS — E11621 Type 2 diabetes mellitus with foot ulcer: Secondary | ICD-10-CM | POA: Diagnosis not present

## 2016-09-20 DIAGNOSIS — L97423 Non-pressure chronic ulcer of left heel and midfoot with necrosis of muscle: Secondary | ICD-10-CM | POA: Diagnosis not present

## 2016-09-20 DIAGNOSIS — K5790 Diverticulosis of intestine, part unspecified, without perforation or abscess without bleeding: Secondary | ICD-10-CM | POA: Diagnosis not present

## 2016-09-21 DIAGNOSIS — E1142 Type 2 diabetes mellitus with diabetic polyneuropathy: Secondary | ICD-10-CM | POA: Diagnosis not present

## 2016-09-21 DIAGNOSIS — E11621 Type 2 diabetes mellitus with foot ulcer: Secondary | ICD-10-CM | POA: Diagnosis not present

## 2016-09-21 DIAGNOSIS — L97521 Non-pressure chronic ulcer of other part of left foot limited to breakdown of skin: Secondary | ICD-10-CM | POA: Diagnosis not present

## 2016-09-21 DIAGNOSIS — M05172 Rheumatoid lung disease with rheumatoid arthritis of left ankle and foot: Secondary | ICD-10-CM | POA: Diagnosis not present

## 2016-09-21 DIAGNOSIS — L97523 Non-pressure chronic ulcer of other part of left foot with necrosis of muscle: Secondary | ICD-10-CM | POA: Diagnosis not present

## 2016-09-21 DIAGNOSIS — I10 Essential (primary) hypertension: Secondary | ICD-10-CM | POA: Diagnosis not present

## 2016-09-21 DIAGNOSIS — L97522 Non-pressure chronic ulcer of other part of left foot with fat layer exposed: Secondary | ICD-10-CM | POA: Diagnosis not present

## 2016-09-25 DIAGNOSIS — E11621 Type 2 diabetes mellitus with foot ulcer: Secondary | ICD-10-CM | POA: Diagnosis not present

## 2016-09-25 DIAGNOSIS — L97423 Non-pressure chronic ulcer of left heel and midfoot with necrosis of muscle: Secondary | ICD-10-CM | POA: Diagnosis not present

## 2016-09-25 DIAGNOSIS — I10 Essential (primary) hypertension: Secondary | ICD-10-CM | POA: Diagnosis not present

## 2016-09-25 DIAGNOSIS — M05172 Rheumatoid lung disease with rheumatoid arthritis of left ankle and foot: Secondary | ICD-10-CM | POA: Diagnosis not present

## 2016-09-25 DIAGNOSIS — E1142 Type 2 diabetes mellitus with diabetic polyneuropathy: Secondary | ICD-10-CM | POA: Diagnosis not present

## 2016-09-25 DIAGNOSIS — K5790 Diverticulosis of intestine, part unspecified, without perforation or abscess without bleeding: Secondary | ICD-10-CM | POA: Diagnosis not present

## 2016-09-27 DIAGNOSIS — M05172 Rheumatoid lung disease with rheumatoid arthritis of left ankle and foot: Secondary | ICD-10-CM | POA: Diagnosis not present

## 2016-09-27 DIAGNOSIS — K5790 Diverticulosis of intestine, part unspecified, without perforation or abscess without bleeding: Secondary | ICD-10-CM | POA: Diagnosis not present

## 2016-09-27 DIAGNOSIS — E1142 Type 2 diabetes mellitus with diabetic polyneuropathy: Secondary | ICD-10-CM | POA: Diagnosis not present

## 2016-09-27 DIAGNOSIS — L97423 Non-pressure chronic ulcer of left heel and midfoot with necrosis of muscle: Secondary | ICD-10-CM | POA: Diagnosis not present

## 2016-09-27 DIAGNOSIS — E11621 Type 2 diabetes mellitus with foot ulcer: Secondary | ICD-10-CM | POA: Diagnosis not present

## 2016-09-27 DIAGNOSIS — I10 Essential (primary) hypertension: Secondary | ICD-10-CM | POA: Diagnosis not present

## 2016-09-28 ENCOUNTER — Encounter (HOSPITAL_BASED_OUTPATIENT_CLINIC_OR_DEPARTMENT_OTHER): Payer: Medicare Other | Attending: Internal Medicine

## 2016-09-28 DIAGNOSIS — L84 Corns and callosities: Secondary | ICD-10-CM | POA: Insufficient documentation

## 2016-09-28 DIAGNOSIS — E11621 Type 2 diabetes mellitus with foot ulcer: Secondary | ICD-10-CM | POA: Insufficient documentation

## 2016-09-28 DIAGNOSIS — L97522 Non-pressure chronic ulcer of other part of left foot with fat layer exposed: Secondary | ICD-10-CM | POA: Insufficient documentation

## 2016-09-28 DIAGNOSIS — M05172 Rheumatoid lung disease with rheumatoid arthritis of left ankle and foot: Secondary | ICD-10-CM | POA: Diagnosis not present

## 2016-09-28 DIAGNOSIS — I10 Essential (primary) hypertension: Secondary | ICD-10-CM | POA: Insufficient documentation

## 2016-09-28 DIAGNOSIS — E1142 Type 2 diabetes mellitus with diabetic polyneuropathy: Secondary | ICD-10-CM | POA: Insufficient documentation

## 2016-09-28 DIAGNOSIS — L97523 Non-pressure chronic ulcer of other part of left foot with necrosis of muscle: Secondary | ICD-10-CM | POA: Diagnosis not present

## 2016-10-01 DIAGNOSIS — I10 Essential (primary) hypertension: Secondary | ICD-10-CM | POA: Diagnosis not present

## 2016-10-01 DIAGNOSIS — K5790 Diverticulosis of intestine, part unspecified, without perforation or abscess without bleeding: Secondary | ICD-10-CM | POA: Diagnosis not present

## 2016-10-01 DIAGNOSIS — E11621 Type 2 diabetes mellitus with foot ulcer: Secondary | ICD-10-CM | POA: Diagnosis not present

## 2016-10-01 DIAGNOSIS — E1142 Type 2 diabetes mellitus with diabetic polyneuropathy: Secondary | ICD-10-CM | POA: Diagnosis not present

## 2016-10-01 DIAGNOSIS — L97423 Non-pressure chronic ulcer of left heel and midfoot with necrosis of muscle: Secondary | ICD-10-CM | POA: Diagnosis not present

## 2016-10-01 DIAGNOSIS — M05172 Rheumatoid lung disease with rheumatoid arthritis of left ankle and foot: Secondary | ICD-10-CM | POA: Diagnosis not present

## 2016-10-03 DIAGNOSIS — M545 Low back pain: Secondary | ICD-10-CM | POA: Diagnosis not present

## 2016-10-03 DIAGNOSIS — L97423 Non-pressure chronic ulcer of left heel and midfoot with necrosis of muscle: Secondary | ICD-10-CM | POA: Diagnosis not present

## 2016-10-03 DIAGNOSIS — M05172 Rheumatoid lung disease with rheumatoid arthritis of left ankle and foot: Secondary | ICD-10-CM | POA: Diagnosis not present

## 2016-10-03 DIAGNOSIS — K5790 Diverticulosis of intestine, part unspecified, without perforation or abscess without bleeding: Secondary | ICD-10-CM | POA: Diagnosis not present

## 2016-10-03 DIAGNOSIS — E11621 Type 2 diabetes mellitus with foot ulcer: Secondary | ICD-10-CM | POA: Diagnosis not present

## 2016-10-03 DIAGNOSIS — E1142 Type 2 diabetes mellitus with diabetic polyneuropathy: Secondary | ICD-10-CM | POA: Diagnosis not present

## 2016-10-03 DIAGNOSIS — N3000 Acute cystitis without hematuria: Secondary | ICD-10-CM | POA: Diagnosis not present

## 2016-10-03 DIAGNOSIS — I10 Essential (primary) hypertension: Secondary | ICD-10-CM | POA: Diagnosis not present

## 2016-10-05 DIAGNOSIS — M05172 Rheumatoid lung disease with rheumatoid arthritis of left ankle and foot: Secondary | ICD-10-CM | POA: Diagnosis not present

## 2016-10-05 DIAGNOSIS — E1142 Type 2 diabetes mellitus with diabetic polyneuropathy: Secondary | ICD-10-CM | POA: Diagnosis not present

## 2016-10-05 DIAGNOSIS — E11621 Type 2 diabetes mellitus with foot ulcer: Secondary | ICD-10-CM | POA: Diagnosis not present

## 2016-10-05 DIAGNOSIS — L84 Corns and callosities: Secondary | ICD-10-CM | POA: Diagnosis not present

## 2016-10-05 DIAGNOSIS — I10 Essential (primary) hypertension: Secondary | ICD-10-CM | POA: Diagnosis not present

## 2016-10-05 DIAGNOSIS — L97522 Non-pressure chronic ulcer of other part of left foot with fat layer exposed: Secondary | ICD-10-CM | POA: Diagnosis not present

## 2016-10-05 DIAGNOSIS — L97523 Non-pressure chronic ulcer of other part of left foot with necrosis of muscle: Secondary | ICD-10-CM | POA: Diagnosis not present

## 2016-10-05 DIAGNOSIS — E119 Type 2 diabetes mellitus without complications: Secondary | ICD-10-CM | POA: Diagnosis not present

## 2016-10-08 DIAGNOSIS — K5790 Diverticulosis of intestine, part unspecified, without perforation or abscess without bleeding: Secondary | ICD-10-CM | POA: Diagnosis not present

## 2016-10-08 DIAGNOSIS — E1142 Type 2 diabetes mellitus with diabetic polyneuropathy: Secondary | ICD-10-CM | POA: Diagnosis not present

## 2016-10-08 DIAGNOSIS — L97423 Non-pressure chronic ulcer of left heel and midfoot with necrosis of muscle: Secondary | ICD-10-CM | POA: Diagnosis not present

## 2016-10-08 DIAGNOSIS — E11621 Type 2 diabetes mellitus with foot ulcer: Secondary | ICD-10-CM | POA: Diagnosis not present

## 2016-10-08 DIAGNOSIS — I10 Essential (primary) hypertension: Secondary | ICD-10-CM | POA: Diagnosis not present

## 2016-10-08 DIAGNOSIS — M05172 Rheumatoid lung disease with rheumatoid arthritis of left ankle and foot: Secondary | ICD-10-CM | POA: Diagnosis not present

## 2016-10-11 DIAGNOSIS — L97423 Non-pressure chronic ulcer of left heel and midfoot with necrosis of muscle: Secondary | ICD-10-CM | POA: Diagnosis not present

## 2016-10-11 DIAGNOSIS — E1142 Type 2 diabetes mellitus with diabetic polyneuropathy: Secondary | ICD-10-CM | POA: Diagnosis not present

## 2016-10-11 DIAGNOSIS — K5790 Diverticulosis of intestine, part unspecified, without perforation or abscess without bleeding: Secondary | ICD-10-CM | POA: Diagnosis not present

## 2016-10-11 DIAGNOSIS — I10 Essential (primary) hypertension: Secondary | ICD-10-CM | POA: Diagnosis not present

## 2016-10-11 DIAGNOSIS — M05172 Rheumatoid lung disease with rheumatoid arthritis of left ankle and foot: Secondary | ICD-10-CM | POA: Diagnosis not present

## 2016-10-11 DIAGNOSIS — E11621 Type 2 diabetes mellitus with foot ulcer: Secondary | ICD-10-CM | POA: Diagnosis not present

## 2016-10-15 DIAGNOSIS — E1142 Type 2 diabetes mellitus with diabetic polyneuropathy: Secondary | ICD-10-CM | POA: Diagnosis not present

## 2016-10-15 DIAGNOSIS — K5790 Diverticulosis of intestine, part unspecified, without perforation or abscess without bleeding: Secondary | ICD-10-CM | POA: Diagnosis not present

## 2016-10-15 DIAGNOSIS — I10 Essential (primary) hypertension: Secondary | ICD-10-CM | POA: Diagnosis not present

## 2016-10-15 DIAGNOSIS — M05172 Rheumatoid lung disease with rheumatoid arthritis of left ankle and foot: Secondary | ICD-10-CM | POA: Diagnosis not present

## 2016-10-15 DIAGNOSIS — E11621 Type 2 diabetes mellitus with foot ulcer: Secondary | ICD-10-CM | POA: Diagnosis not present

## 2016-10-15 DIAGNOSIS — L97423 Non-pressure chronic ulcer of left heel and midfoot with necrosis of muscle: Secondary | ICD-10-CM | POA: Diagnosis not present

## 2016-10-17 DIAGNOSIS — K5790 Diverticulosis of intestine, part unspecified, without perforation or abscess without bleeding: Secondary | ICD-10-CM | POA: Diagnosis not present

## 2016-10-17 DIAGNOSIS — E11621 Type 2 diabetes mellitus with foot ulcer: Secondary | ICD-10-CM | POA: Diagnosis not present

## 2016-10-17 DIAGNOSIS — E1142 Type 2 diabetes mellitus with diabetic polyneuropathy: Secondary | ICD-10-CM | POA: Diagnosis not present

## 2016-10-17 DIAGNOSIS — L97423 Non-pressure chronic ulcer of left heel and midfoot with necrosis of muscle: Secondary | ICD-10-CM | POA: Diagnosis not present

## 2016-10-17 DIAGNOSIS — I10 Essential (primary) hypertension: Secondary | ICD-10-CM | POA: Diagnosis not present

## 2016-10-17 DIAGNOSIS — M05172 Rheumatoid lung disease with rheumatoid arthritis of left ankle and foot: Secondary | ICD-10-CM | POA: Diagnosis not present

## 2016-10-19 DIAGNOSIS — L97522 Non-pressure chronic ulcer of other part of left foot with fat layer exposed: Secondary | ICD-10-CM | POA: Diagnosis not present

## 2016-10-19 DIAGNOSIS — I10 Essential (primary) hypertension: Secondary | ICD-10-CM | POA: Diagnosis not present

## 2016-10-19 DIAGNOSIS — E11621 Type 2 diabetes mellitus with foot ulcer: Secondary | ICD-10-CM | POA: Diagnosis not present

## 2016-10-19 DIAGNOSIS — E1142 Type 2 diabetes mellitus with diabetic polyneuropathy: Secondary | ICD-10-CM | POA: Diagnosis not present

## 2016-10-19 DIAGNOSIS — M069 Rheumatoid arthritis, unspecified: Secondary | ICD-10-CM | POA: Diagnosis not present

## 2016-10-19 DIAGNOSIS — L97521 Non-pressure chronic ulcer of other part of left foot limited to breakdown of skin: Secondary | ICD-10-CM | POA: Diagnosis not present

## 2016-10-19 DIAGNOSIS — L84 Corns and callosities: Secondary | ICD-10-CM | POA: Diagnosis not present

## 2016-10-19 DIAGNOSIS — L97523 Non-pressure chronic ulcer of other part of left foot with necrosis of muscle: Secondary | ICD-10-CM | POA: Diagnosis not present

## 2016-10-19 DIAGNOSIS — M05172 Rheumatoid lung disease with rheumatoid arthritis of left ankle and foot: Secondary | ICD-10-CM | POA: Diagnosis not present

## 2016-10-22 DIAGNOSIS — E11621 Type 2 diabetes mellitus with foot ulcer: Secondary | ICD-10-CM | POA: Diagnosis not present

## 2016-10-22 DIAGNOSIS — E1142 Type 2 diabetes mellitus with diabetic polyneuropathy: Secondary | ICD-10-CM | POA: Diagnosis not present

## 2016-10-22 DIAGNOSIS — K5790 Diverticulosis of intestine, part unspecified, without perforation or abscess without bleeding: Secondary | ICD-10-CM | POA: Diagnosis not present

## 2016-10-22 DIAGNOSIS — M05172 Rheumatoid lung disease with rheumatoid arthritis of left ankle and foot: Secondary | ICD-10-CM | POA: Diagnosis not present

## 2016-10-22 DIAGNOSIS — L97423 Non-pressure chronic ulcer of left heel and midfoot with necrosis of muscle: Secondary | ICD-10-CM | POA: Diagnosis not present

## 2016-10-22 DIAGNOSIS — I10 Essential (primary) hypertension: Secondary | ICD-10-CM | POA: Diagnosis not present

## 2016-10-24 DIAGNOSIS — E11621 Type 2 diabetes mellitus with foot ulcer: Secondary | ICD-10-CM | POA: Diagnosis not present

## 2016-10-24 DIAGNOSIS — E1142 Type 2 diabetes mellitus with diabetic polyneuropathy: Secondary | ICD-10-CM | POA: Diagnosis not present

## 2016-10-24 DIAGNOSIS — M05172 Rheumatoid lung disease with rheumatoid arthritis of left ankle and foot: Secondary | ICD-10-CM | POA: Diagnosis not present

## 2016-10-24 DIAGNOSIS — L97423 Non-pressure chronic ulcer of left heel and midfoot with necrosis of muscle: Secondary | ICD-10-CM | POA: Diagnosis not present

## 2016-10-24 DIAGNOSIS — I10 Essential (primary) hypertension: Secondary | ICD-10-CM | POA: Diagnosis not present

## 2016-10-24 DIAGNOSIS — K5790 Diverticulosis of intestine, part unspecified, without perforation or abscess without bleeding: Secondary | ICD-10-CM | POA: Diagnosis not present

## 2016-10-26 ENCOUNTER — Encounter (HOSPITAL_BASED_OUTPATIENT_CLINIC_OR_DEPARTMENT_OTHER): Payer: Medicare Other | Attending: Internal Medicine

## 2016-10-26 DIAGNOSIS — E11621 Type 2 diabetes mellitus with foot ulcer: Secondary | ICD-10-CM | POA: Diagnosis not present

## 2016-10-26 DIAGNOSIS — I10 Essential (primary) hypertension: Secondary | ICD-10-CM | POA: Diagnosis not present

## 2016-10-26 DIAGNOSIS — L97521 Non-pressure chronic ulcer of other part of left foot limited to breakdown of skin: Secondary | ICD-10-CM | POA: Insufficient documentation

## 2016-10-26 DIAGNOSIS — L97522 Non-pressure chronic ulcer of other part of left foot with fat layer exposed: Secondary | ICD-10-CM | POA: Diagnosis not present

## 2016-10-26 DIAGNOSIS — Z89432 Acquired absence of left foot: Secondary | ICD-10-CM | POA: Insufficient documentation

## 2016-10-26 DIAGNOSIS — L84 Corns and callosities: Secondary | ICD-10-CM | POA: Diagnosis not present

## 2016-10-26 DIAGNOSIS — E1142 Type 2 diabetes mellitus with diabetic polyneuropathy: Secondary | ICD-10-CM | POA: Insufficient documentation

## 2016-10-26 DIAGNOSIS — M05172 Rheumatoid lung disease with rheumatoid arthritis of left ankle and foot: Secondary | ICD-10-CM | POA: Insufficient documentation

## 2016-10-26 DIAGNOSIS — L97523 Non-pressure chronic ulcer of other part of left foot with necrosis of muscle: Secondary | ICD-10-CM | POA: Diagnosis not present

## 2016-10-29 DIAGNOSIS — L97423 Non-pressure chronic ulcer of left heel and midfoot with necrosis of muscle: Secondary | ICD-10-CM | POA: Diagnosis not present

## 2016-10-29 DIAGNOSIS — I10 Essential (primary) hypertension: Secondary | ICD-10-CM | POA: Diagnosis not present

## 2016-10-29 DIAGNOSIS — E11621 Type 2 diabetes mellitus with foot ulcer: Secondary | ICD-10-CM | POA: Diagnosis not present

## 2016-10-29 DIAGNOSIS — E1142 Type 2 diabetes mellitus with diabetic polyneuropathy: Secondary | ICD-10-CM | POA: Diagnosis not present

## 2016-10-29 DIAGNOSIS — K5790 Diverticulosis of intestine, part unspecified, without perforation or abscess without bleeding: Secondary | ICD-10-CM | POA: Diagnosis not present

## 2016-10-29 DIAGNOSIS — M05172 Rheumatoid lung disease with rheumatoid arthritis of left ankle and foot: Secondary | ICD-10-CM | POA: Diagnosis not present

## 2016-10-31 DIAGNOSIS — M05172 Rheumatoid lung disease with rheumatoid arthritis of left ankle and foot: Secondary | ICD-10-CM | POA: Diagnosis not present

## 2016-10-31 DIAGNOSIS — K5790 Diverticulosis of intestine, part unspecified, without perforation or abscess without bleeding: Secondary | ICD-10-CM | POA: Diagnosis not present

## 2016-10-31 DIAGNOSIS — L97423 Non-pressure chronic ulcer of left heel and midfoot with necrosis of muscle: Secondary | ICD-10-CM | POA: Diagnosis not present

## 2016-10-31 DIAGNOSIS — E1142 Type 2 diabetes mellitus with diabetic polyneuropathy: Secondary | ICD-10-CM | POA: Diagnosis not present

## 2016-10-31 DIAGNOSIS — I10 Essential (primary) hypertension: Secondary | ICD-10-CM | POA: Diagnosis not present

## 2016-10-31 DIAGNOSIS — E11621 Type 2 diabetes mellitus with foot ulcer: Secondary | ICD-10-CM | POA: Diagnosis not present

## 2016-11-02 DIAGNOSIS — E11621 Type 2 diabetes mellitus with foot ulcer: Secondary | ICD-10-CM | POA: Diagnosis not present

## 2016-11-02 DIAGNOSIS — L97522 Non-pressure chronic ulcer of other part of left foot with fat layer exposed: Secondary | ICD-10-CM | POA: Diagnosis not present

## 2016-11-02 DIAGNOSIS — I10 Essential (primary) hypertension: Secondary | ICD-10-CM | POA: Diagnosis not present

## 2016-11-02 DIAGNOSIS — E039 Hypothyroidism, unspecified: Secondary | ICD-10-CM | POA: Diagnosis not present

## 2016-11-02 DIAGNOSIS — L97521 Non-pressure chronic ulcer of other part of left foot limited to breakdown of skin: Secondary | ICD-10-CM | POA: Diagnosis not present

## 2016-11-02 DIAGNOSIS — L84 Corns and callosities: Secondary | ICD-10-CM | POA: Diagnosis not present

## 2016-11-02 DIAGNOSIS — M05172 Rheumatoid lung disease with rheumatoid arthritis of left ankle and foot: Secondary | ICD-10-CM | POA: Diagnosis not present

## 2016-11-02 DIAGNOSIS — Z7984 Long term (current) use of oral hypoglycemic drugs: Secondary | ICD-10-CM | POA: Diagnosis not present

## 2016-11-02 DIAGNOSIS — L97421 Non-pressure chronic ulcer of left heel and midfoot limited to breakdown of skin: Secondary | ICD-10-CM | POA: Diagnosis not present

## 2016-11-02 DIAGNOSIS — E1142 Type 2 diabetes mellitus with diabetic polyneuropathy: Secondary | ICD-10-CM | POA: Diagnosis not present

## 2016-11-02 DIAGNOSIS — F329 Major depressive disorder, single episode, unspecified: Secondary | ICD-10-CM | POA: Diagnosis not present

## 2016-11-02 DIAGNOSIS — D649 Anemia, unspecified: Secondary | ICD-10-CM | POA: Diagnosis not present

## 2016-11-02 DIAGNOSIS — K5790 Diverticulosis of intestine, part unspecified, without perforation or abscess without bleeding: Secondary | ICD-10-CM | POA: Diagnosis not present

## 2016-11-05 DIAGNOSIS — M810 Age-related osteoporosis without current pathological fracture: Secondary | ICD-10-CM | POA: Diagnosis not present

## 2016-11-05 DIAGNOSIS — K5790 Diverticulosis of intestine, part unspecified, without perforation or abscess without bleeding: Secondary | ICD-10-CM | POA: Diagnosis not present

## 2016-11-05 DIAGNOSIS — M05172 Rheumatoid lung disease with rheumatoid arthritis of left ankle and foot: Secondary | ICD-10-CM | POA: Diagnosis not present

## 2016-11-05 DIAGNOSIS — L97421 Non-pressure chronic ulcer of left heel and midfoot limited to breakdown of skin: Secondary | ICD-10-CM | POA: Diagnosis not present

## 2016-11-05 DIAGNOSIS — E11621 Type 2 diabetes mellitus with foot ulcer: Secondary | ICD-10-CM | POA: Diagnosis not present

## 2016-11-05 DIAGNOSIS — I10 Essential (primary) hypertension: Secondary | ICD-10-CM | POA: Diagnosis not present

## 2016-11-05 DIAGNOSIS — M79643 Pain in unspecified hand: Secondary | ICD-10-CM | POA: Diagnosis not present

## 2016-11-05 DIAGNOSIS — M0579 Rheumatoid arthritis with rheumatoid factor of multiple sites without organ or systems involvement: Secondary | ICD-10-CM | POA: Diagnosis not present

## 2016-11-05 DIAGNOSIS — E1142 Type 2 diabetes mellitus with diabetic polyneuropathy: Secondary | ICD-10-CM | POA: Diagnosis not present

## 2016-11-05 DIAGNOSIS — Z79899 Other long term (current) drug therapy: Secondary | ICD-10-CM | POA: Diagnosis not present

## 2016-11-07 DIAGNOSIS — M0689 Other specified rheumatoid arthritis, multiple sites: Secondary | ICD-10-CM | POA: Diagnosis not present

## 2016-11-07 DIAGNOSIS — K5790 Diverticulosis of intestine, part unspecified, without perforation or abscess without bleeding: Secondary | ICD-10-CM | POA: Diagnosis not present

## 2016-11-07 DIAGNOSIS — E11621 Type 2 diabetes mellitus with foot ulcer: Secondary | ICD-10-CM | POA: Diagnosis not present

## 2016-11-07 DIAGNOSIS — E1142 Type 2 diabetes mellitus with diabetic polyneuropathy: Secondary | ICD-10-CM | POA: Diagnosis not present

## 2016-11-07 DIAGNOSIS — I10 Essential (primary) hypertension: Secondary | ICD-10-CM | POA: Diagnosis not present

## 2016-11-07 DIAGNOSIS — M05172 Rheumatoid lung disease with rheumatoid arthritis of left ankle and foot: Secondary | ICD-10-CM | POA: Diagnosis not present

## 2016-11-07 DIAGNOSIS — L97421 Non-pressure chronic ulcer of left heel and midfoot limited to breakdown of skin: Secondary | ICD-10-CM | POA: Diagnosis not present

## 2016-11-07 DIAGNOSIS — M25561 Pain in right knee: Secondary | ICD-10-CM | POA: Diagnosis not present

## 2016-11-07 DIAGNOSIS — M1711 Unilateral primary osteoarthritis, right knee: Secondary | ICD-10-CM | POA: Diagnosis not present

## 2016-11-07 DIAGNOSIS — G8929 Other chronic pain: Secondary | ICD-10-CM | POA: Diagnosis not present

## 2016-11-09 DIAGNOSIS — E11621 Type 2 diabetes mellitus with foot ulcer: Secondary | ICD-10-CM | POA: Diagnosis not present

## 2016-11-09 DIAGNOSIS — L97522 Non-pressure chronic ulcer of other part of left foot with fat layer exposed: Secondary | ICD-10-CM | POA: Diagnosis not present

## 2016-11-09 DIAGNOSIS — E1142 Type 2 diabetes mellitus with diabetic polyneuropathy: Secondary | ICD-10-CM | POA: Diagnosis not present

## 2016-11-09 DIAGNOSIS — L97521 Non-pressure chronic ulcer of other part of left foot limited to breakdown of skin: Secondary | ICD-10-CM | POA: Diagnosis not present

## 2016-11-09 DIAGNOSIS — M05172 Rheumatoid lung disease with rheumatoid arthritis of left ankle and foot: Secondary | ICD-10-CM | POA: Diagnosis not present

## 2016-11-09 DIAGNOSIS — L84 Corns and callosities: Secondary | ICD-10-CM | POA: Diagnosis not present

## 2016-11-09 DIAGNOSIS — I10 Essential (primary) hypertension: Secondary | ICD-10-CM | POA: Diagnosis not present

## 2016-11-12 DIAGNOSIS — E11621 Type 2 diabetes mellitus with foot ulcer: Secondary | ICD-10-CM | POA: Diagnosis not present

## 2016-11-12 DIAGNOSIS — L97421 Non-pressure chronic ulcer of left heel and midfoot limited to breakdown of skin: Secondary | ICD-10-CM | POA: Diagnosis not present

## 2016-11-12 DIAGNOSIS — K5790 Diverticulosis of intestine, part unspecified, without perforation or abscess without bleeding: Secondary | ICD-10-CM | POA: Diagnosis not present

## 2016-11-12 DIAGNOSIS — M05172 Rheumatoid lung disease with rheumatoid arthritis of left ankle and foot: Secondary | ICD-10-CM | POA: Diagnosis not present

## 2016-11-12 DIAGNOSIS — E1142 Type 2 diabetes mellitus with diabetic polyneuropathy: Secondary | ICD-10-CM | POA: Diagnosis not present

## 2016-11-12 DIAGNOSIS — I10 Essential (primary) hypertension: Secondary | ICD-10-CM | POA: Diagnosis not present

## 2016-11-14 DIAGNOSIS — K5790 Diverticulosis of intestine, part unspecified, without perforation or abscess without bleeding: Secondary | ICD-10-CM | POA: Diagnosis not present

## 2016-11-14 DIAGNOSIS — E1142 Type 2 diabetes mellitus with diabetic polyneuropathy: Secondary | ICD-10-CM | POA: Diagnosis not present

## 2016-11-14 DIAGNOSIS — L97421 Non-pressure chronic ulcer of left heel and midfoot limited to breakdown of skin: Secondary | ICD-10-CM | POA: Diagnosis not present

## 2016-11-14 DIAGNOSIS — M05172 Rheumatoid lung disease with rheumatoid arthritis of left ankle and foot: Secondary | ICD-10-CM | POA: Diagnosis not present

## 2016-11-14 DIAGNOSIS — E11621 Type 2 diabetes mellitus with foot ulcer: Secondary | ICD-10-CM | POA: Diagnosis not present

## 2016-11-14 DIAGNOSIS — I10 Essential (primary) hypertension: Secondary | ICD-10-CM | POA: Diagnosis not present

## 2016-11-16 DIAGNOSIS — E11621 Type 2 diabetes mellitus with foot ulcer: Secondary | ICD-10-CM | POA: Diagnosis not present

## 2016-11-16 DIAGNOSIS — L97521 Non-pressure chronic ulcer of other part of left foot limited to breakdown of skin: Secondary | ICD-10-CM | POA: Diagnosis not present

## 2016-11-16 DIAGNOSIS — E1142 Type 2 diabetes mellitus with diabetic polyneuropathy: Secondary | ICD-10-CM | POA: Diagnosis not present

## 2016-11-16 DIAGNOSIS — M05172 Rheumatoid lung disease with rheumatoid arthritis of left ankle and foot: Secondary | ICD-10-CM | POA: Diagnosis not present

## 2016-11-16 DIAGNOSIS — L84 Corns and callosities: Secondary | ICD-10-CM | POA: Diagnosis not present

## 2016-11-16 DIAGNOSIS — I10 Essential (primary) hypertension: Secondary | ICD-10-CM | POA: Diagnosis not present

## 2016-11-19 DIAGNOSIS — I10 Essential (primary) hypertension: Secondary | ICD-10-CM | POA: Diagnosis not present

## 2016-11-19 DIAGNOSIS — K5790 Diverticulosis of intestine, part unspecified, without perforation or abscess without bleeding: Secondary | ICD-10-CM | POA: Diagnosis not present

## 2016-11-19 DIAGNOSIS — L97421 Non-pressure chronic ulcer of left heel and midfoot limited to breakdown of skin: Secondary | ICD-10-CM | POA: Diagnosis not present

## 2016-11-19 DIAGNOSIS — M05172 Rheumatoid lung disease with rheumatoid arthritis of left ankle and foot: Secondary | ICD-10-CM | POA: Diagnosis not present

## 2016-11-19 DIAGNOSIS — E1142 Type 2 diabetes mellitus with diabetic polyneuropathy: Secondary | ICD-10-CM | POA: Diagnosis not present

## 2016-11-19 DIAGNOSIS — E11621 Type 2 diabetes mellitus with foot ulcer: Secondary | ICD-10-CM | POA: Diagnosis not present

## 2016-11-20 DIAGNOSIS — Z79899 Other long term (current) drug therapy: Secondary | ICD-10-CM | POA: Diagnosis not present

## 2016-11-21 DIAGNOSIS — K5790 Diverticulosis of intestine, part unspecified, without perforation or abscess without bleeding: Secondary | ICD-10-CM | POA: Diagnosis not present

## 2016-11-21 DIAGNOSIS — E1142 Type 2 diabetes mellitus with diabetic polyneuropathy: Secondary | ICD-10-CM | POA: Diagnosis not present

## 2016-11-21 DIAGNOSIS — I10 Essential (primary) hypertension: Secondary | ICD-10-CM | POA: Diagnosis not present

## 2016-11-21 DIAGNOSIS — L97421 Non-pressure chronic ulcer of left heel and midfoot limited to breakdown of skin: Secondary | ICD-10-CM | POA: Diagnosis not present

## 2016-11-21 DIAGNOSIS — M05172 Rheumatoid lung disease with rheumatoid arthritis of left ankle and foot: Secondary | ICD-10-CM | POA: Diagnosis not present

## 2016-11-21 DIAGNOSIS — E11621 Type 2 diabetes mellitus with foot ulcer: Secondary | ICD-10-CM | POA: Diagnosis not present

## 2016-11-23 ENCOUNTER — Encounter (HOSPITAL_BASED_OUTPATIENT_CLINIC_OR_DEPARTMENT_OTHER): Payer: Medicare Other | Attending: Internal Medicine

## 2016-11-23 DIAGNOSIS — L97422 Non-pressure chronic ulcer of left heel and midfoot with fat layer exposed: Secondary | ICD-10-CM | POA: Insufficient documentation

## 2016-11-23 DIAGNOSIS — I10 Essential (primary) hypertension: Secondary | ICD-10-CM | POA: Diagnosis not present

## 2016-11-23 DIAGNOSIS — B9561 Methicillin susceptible Staphylococcus aureus infection as the cause of diseases classified elsewhere: Secondary | ICD-10-CM | POA: Diagnosis not present

## 2016-11-23 DIAGNOSIS — L97522 Non-pressure chronic ulcer of other part of left foot with fat layer exposed: Secondary | ICD-10-CM | POA: Diagnosis not present

## 2016-11-23 DIAGNOSIS — E11621 Type 2 diabetes mellitus with foot ulcer: Secondary | ICD-10-CM | POA: Diagnosis not present

## 2016-11-26 DIAGNOSIS — K5790 Diverticulosis of intestine, part unspecified, without perforation or abscess without bleeding: Secondary | ICD-10-CM | POA: Diagnosis not present

## 2016-11-26 DIAGNOSIS — L97421 Non-pressure chronic ulcer of left heel and midfoot limited to breakdown of skin: Secondary | ICD-10-CM | POA: Diagnosis not present

## 2016-11-26 DIAGNOSIS — E1142 Type 2 diabetes mellitus with diabetic polyneuropathy: Secondary | ICD-10-CM | POA: Diagnosis not present

## 2016-11-26 DIAGNOSIS — E11621 Type 2 diabetes mellitus with foot ulcer: Secondary | ICD-10-CM | POA: Diagnosis not present

## 2016-11-26 DIAGNOSIS — I10 Essential (primary) hypertension: Secondary | ICD-10-CM | POA: Diagnosis not present

## 2016-11-26 DIAGNOSIS — M05172 Rheumatoid lung disease with rheumatoid arthritis of left ankle and foot: Secondary | ICD-10-CM | POA: Diagnosis not present

## 2016-11-28 DIAGNOSIS — L97421 Non-pressure chronic ulcer of left heel and midfoot limited to breakdown of skin: Secondary | ICD-10-CM | POA: Diagnosis not present

## 2016-11-28 DIAGNOSIS — E11621 Type 2 diabetes mellitus with foot ulcer: Secondary | ICD-10-CM | POA: Diagnosis not present

## 2016-11-28 DIAGNOSIS — I10 Essential (primary) hypertension: Secondary | ICD-10-CM | POA: Diagnosis not present

## 2016-11-28 DIAGNOSIS — E1142 Type 2 diabetes mellitus with diabetic polyneuropathy: Secondary | ICD-10-CM | POA: Diagnosis not present

## 2016-11-28 DIAGNOSIS — K5790 Diverticulosis of intestine, part unspecified, without perforation or abscess without bleeding: Secondary | ICD-10-CM | POA: Diagnosis not present

## 2016-11-28 DIAGNOSIS — M05172 Rheumatoid lung disease with rheumatoid arthritis of left ankle and foot: Secondary | ICD-10-CM | POA: Diagnosis not present

## 2016-11-30 DIAGNOSIS — E11621 Type 2 diabetes mellitus with foot ulcer: Secondary | ICD-10-CM | POA: Diagnosis not present

## 2016-11-30 DIAGNOSIS — B9561 Methicillin susceptible Staphylococcus aureus infection as the cause of diseases classified elsewhere: Secondary | ICD-10-CM | POA: Diagnosis not present

## 2016-11-30 DIAGNOSIS — L97422 Non-pressure chronic ulcer of left heel and midfoot with fat layer exposed: Secondary | ICD-10-CM | POA: Diagnosis not present

## 2016-11-30 DIAGNOSIS — I10 Essential (primary) hypertension: Secondary | ICD-10-CM | POA: Diagnosis not present

## 2016-11-30 DIAGNOSIS — L97522 Non-pressure chronic ulcer of other part of left foot with fat layer exposed: Secondary | ICD-10-CM | POA: Diagnosis not present

## 2016-12-03 DIAGNOSIS — I10 Essential (primary) hypertension: Secondary | ICD-10-CM | POA: Diagnosis not present

## 2016-12-03 DIAGNOSIS — L97421 Non-pressure chronic ulcer of left heel and midfoot limited to breakdown of skin: Secondary | ICD-10-CM | POA: Diagnosis not present

## 2016-12-03 DIAGNOSIS — K5790 Diverticulosis of intestine, part unspecified, without perforation or abscess without bleeding: Secondary | ICD-10-CM | POA: Diagnosis not present

## 2016-12-03 DIAGNOSIS — M05172 Rheumatoid lung disease with rheumatoid arthritis of left ankle and foot: Secondary | ICD-10-CM | POA: Diagnosis not present

## 2016-12-03 DIAGNOSIS — E11621 Type 2 diabetes mellitus with foot ulcer: Secondary | ICD-10-CM | POA: Diagnosis not present

## 2016-12-03 DIAGNOSIS — E1142 Type 2 diabetes mellitus with diabetic polyneuropathy: Secondary | ICD-10-CM | POA: Diagnosis not present

## 2016-12-05 DIAGNOSIS — L97421 Non-pressure chronic ulcer of left heel and midfoot limited to breakdown of skin: Secondary | ICD-10-CM | POA: Diagnosis not present

## 2016-12-05 DIAGNOSIS — E11621 Type 2 diabetes mellitus with foot ulcer: Secondary | ICD-10-CM | POA: Diagnosis not present

## 2016-12-05 DIAGNOSIS — M05172 Rheumatoid lung disease with rheumatoid arthritis of left ankle and foot: Secondary | ICD-10-CM | POA: Diagnosis not present

## 2016-12-05 DIAGNOSIS — K5790 Diverticulosis of intestine, part unspecified, without perforation or abscess without bleeding: Secondary | ICD-10-CM | POA: Diagnosis not present

## 2016-12-05 DIAGNOSIS — E1142 Type 2 diabetes mellitus with diabetic polyneuropathy: Secondary | ICD-10-CM | POA: Diagnosis not present

## 2016-12-05 DIAGNOSIS — I10 Essential (primary) hypertension: Secondary | ICD-10-CM | POA: Diagnosis not present

## 2016-12-07 DIAGNOSIS — L97523 Non-pressure chronic ulcer of other part of left foot with necrosis of muscle: Secondary | ICD-10-CM | POA: Diagnosis not present

## 2016-12-07 DIAGNOSIS — B9561 Methicillin susceptible Staphylococcus aureus infection as the cause of diseases classified elsewhere: Secondary | ICD-10-CM | POA: Diagnosis not present

## 2016-12-07 DIAGNOSIS — L97422 Non-pressure chronic ulcer of left heel and midfoot with fat layer exposed: Secondary | ICD-10-CM | POA: Diagnosis not present

## 2016-12-07 DIAGNOSIS — I10 Essential (primary) hypertension: Secondary | ICD-10-CM | POA: Diagnosis not present

## 2016-12-07 DIAGNOSIS — E11621 Type 2 diabetes mellitus with foot ulcer: Secondary | ICD-10-CM | POA: Diagnosis not present

## 2016-12-07 DIAGNOSIS — L97522 Non-pressure chronic ulcer of other part of left foot with fat layer exposed: Secondary | ICD-10-CM | POA: Diagnosis not present

## 2016-12-10 DIAGNOSIS — I10 Essential (primary) hypertension: Secondary | ICD-10-CM | POA: Diagnosis not present

## 2016-12-10 DIAGNOSIS — K5790 Diverticulosis of intestine, part unspecified, without perforation or abscess without bleeding: Secondary | ICD-10-CM | POA: Diagnosis not present

## 2016-12-10 DIAGNOSIS — E1142 Type 2 diabetes mellitus with diabetic polyneuropathy: Secondary | ICD-10-CM | POA: Diagnosis not present

## 2016-12-10 DIAGNOSIS — M05172 Rheumatoid lung disease with rheumatoid arthritis of left ankle and foot: Secondary | ICD-10-CM | POA: Diagnosis not present

## 2016-12-10 DIAGNOSIS — E11621 Type 2 diabetes mellitus with foot ulcer: Secondary | ICD-10-CM | POA: Diagnosis not present

## 2016-12-10 DIAGNOSIS — L97421 Non-pressure chronic ulcer of left heel and midfoot limited to breakdown of skin: Secondary | ICD-10-CM | POA: Diagnosis not present

## 2016-12-12 DIAGNOSIS — M05172 Rheumatoid lung disease with rheumatoid arthritis of left ankle and foot: Secondary | ICD-10-CM | POA: Diagnosis not present

## 2016-12-12 DIAGNOSIS — K5790 Diverticulosis of intestine, part unspecified, without perforation or abscess without bleeding: Secondary | ICD-10-CM | POA: Diagnosis not present

## 2016-12-12 DIAGNOSIS — I10 Essential (primary) hypertension: Secondary | ICD-10-CM | POA: Diagnosis not present

## 2016-12-12 DIAGNOSIS — L97421 Non-pressure chronic ulcer of left heel and midfoot limited to breakdown of skin: Secondary | ICD-10-CM | POA: Diagnosis not present

## 2016-12-12 DIAGNOSIS — E1142 Type 2 diabetes mellitus with diabetic polyneuropathy: Secondary | ICD-10-CM | POA: Diagnosis not present

## 2016-12-12 DIAGNOSIS — E11621 Type 2 diabetes mellitus with foot ulcer: Secondary | ICD-10-CM | POA: Diagnosis not present

## 2016-12-14 DIAGNOSIS — L97422 Non-pressure chronic ulcer of left heel and midfoot with fat layer exposed: Secondary | ICD-10-CM | POA: Diagnosis not present

## 2016-12-14 DIAGNOSIS — B9561 Methicillin susceptible Staphylococcus aureus infection as the cause of diseases classified elsewhere: Secondary | ICD-10-CM | POA: Diagnosis not present

## 2016-12-14 DIAGNOSIS — I10 Essential (primary) hypertension: Secondary | ICD-10-CM | POA: Diagnosis not present

## 2016-12-14 DIAGNOSIS — L97522 Non-pressure chronic ulcer of other part of left foot with fat layer exposed: Secondary | ICD-10-CM | POA: Diagnosis not present

## 2016-12-14 DIAGNOSIS — E11621 Type 2 diabetes mellitus with foot ulcer: Secondary | ICD-10-CM | POA: Diagnosis not present

## 2016-12-17 DIAGNOSIS — E11621 Type 2 diabetes mellitus with foot ulcer: Secondary | ICD-10-CM | POA: Diagnosis not present

## 2016-12-17 DIAGNOSIS — I10 Essential (primary) hypertension: Secondary | ICD-10-CM | POA: Diagnosis not present

## 2016-12-17 DIAGNOSIS — E1142 Type 2 diabetes mellitus with diabetic polyneuropathy: Secondary | ICD-10-CM | POA: Diagnosis not present

## 2016-12-17 DIAGNOSIS — L97421 Non-pressure chronic ulcer of left heel and midfoot limited to breakdown of skin: Secondary | ICD-10-CM | POA: Diagnosis not present

## 2016-12-17 DIAGNOSIS — M05172 Rheumatoid lung disease with rheumatoid arthritis of left ankle and foot: Secondary | ICD-10-CM | POA: Diagnosis not present

## 2016-12-17 DIAGNOSIS — K5790 Diverticulosis of intestine, part unspecified, without perforation or abscess without bleeding: Secondary | ICD-10-CM | POA: Diagnosis not present

## 2016-12-19 DIAGNOSIS — E1142 Type 2 diabetes mellitus with diabetic polyneuropathy: Secondary | ICD-10-CM | POA: Diagnosis not present

## 2016-12-19 DIAGNOSIS — L97421 Non-pressure chronic ulcer of left heel and midfoot limited to breakdown of skin: Secondary | ICD-10-CM | POA: Diagnosis not present

## 2016-12-19 DIAGNOSIS — M05172 Rheumatoid lung disease with rheumatoid arthritis of left ankle and foot: Secondary | ICD-10-CM | POA: Diagnosis not present

## 2016-12-19 DIAGNOSIS — I10 Essential (primary) hypertension: Secondary | ICD-10-CM | POA: Diagnosis not present

## 2016-12-19 DIAGNOSIS — E11621 Type 2 diabetes mellitus with foot ulcer: Secondary | ICD-10-CM | POA: Diagnosis not present

## 2016-12-19 DIAGNOSIS — K5790 Diverticulosis of intestine, part unspecified, without perforation or abscess without bleeding: Secondary | ICD-10-CM | POA: Diagnosis not present

## 2016-12-20 ENCOUNTER — Encounter: Payer: Self-pay | Admitting: Podiatry

## 2016-12-20 ENCOUNTER — Ambulatory Visit (INDEPENDENT_AMBULATORY_CARE_PROVIDER_SITE_OTHER): Payer: Medicare Other | Admitting: Podiatry

## 2016-12-20 ENCOUNTER — Ambulatory Visit: Payer: Medicare Other | Admitting: Podiatry

## 2016-12-20 DIAGNOSIS — B351 Tinea unguium: Secondary | ICD-10-CM

## 2016-12-20 DIAGNOSIS — E1140 Type 2 diabetes mellitus with diabetic neuropathy, unspecified: Secondary | ICD-10-CM | POA: Diagnosis not present

## 2016-12-20 DIAGNOSIS — M79676 Pain in unspecified toe(s): Secondary | ICD-10-CM | POA: Diagnosis not present

## 2016-12-20 DIAGNOSIS — L84 Corns and callosities: Secondary | ICD-10-CM | POA: Diagnosis not present

## 2016-12-21 DIAGNOSIS — L97422 Non-pressure chronic ulcer of left heel and midfoot with fat layer exposed: Secondary | ICD-10-CM | POA: Diagnosis not present

## 2016-12-21 DIAGNOSIS — B9561 Methicillin susceptible Staphylococcus aureus infection as the cause of diseases classified elsewhere: Secondary | ICD-10-CM | POA: Diagnosis not present

## 2016-12-21 DIAGNOSIS — E11621 Type 2 diabetes mellitus with foot ulcer: Secondary | ICD-10-CM | POA: Diagnosis not present

## 2016-12-21 DIAGNOSIS — L97522 Non-pressure chronic ulcer of other part of left foot with fat layer exposed: Secondary | ICD-10-CM | POA: Diagnosis not present

## 2016-12-21 DIAGNOSIS — I10 Essential (primary) hypertension: Secondary | ICD-10-CM | POA: Diagnosis not present

## 2016-12-21 NOTE — Progress Notes (Signed)
Subjective: 83 y.o. returns the office today for painful, elongated, thickened toenails which she cannot trim herself. Denies any redness or drainage around the nails. She is also been going to the wound care center for last 4 months for the wound on the left foot submetatarsal one. She is coming to see him again tomorrow. She is also tenderness a corn on the right second toe as well as the bottom of the right foot which is painful with pressure in shoes. She denies any other open sores Denies any acute changes since last appointment and no new complaints today. Denies any systemic complaints such as fevers, chills, nausea, vomiting.   Objective: AAO 3, NAD DP/PT pulses palpable, CRT less than 3 seconds Nails hypertrophic, dystrophic, elongated, brittle, discolored 9. There is tenderness overlying the nails 1-5 bilaterally except left 2nd. There is no surrounding erythema or drainage along the nail sites. Hyperkeratotic lesion with small central pinpoint ulceration left foot second metatarsal 1. Also hyperkeratotic lesion right second dorsal PIPJ and right sub 5 space. Upon debridement no underlying ulceration, drainage or any clinical signs of infection to the 2 hyperkeratotic lesions. No open lesions or pre-ulcerative lesions are identified. No other areas of tenderness bilateral lower extremities. No overlying edema, erythema, increased warmth. No pain with calf compression, swelling, warmth, erythema.  Assessment: Patient presents with symptomatic onychomycosis; pre-ulcerative calluses 2 with ulceration left foot  Plan: -Treatment options including alternatives, risks, complications were discussed -Nails sharply debrided 9 without complication/bleeding.  -Hyperkeratotic lesions were debrided 2 without complications or bleeding. -Continue to follow up with the wound care center for the left foot wound. Did not debride this today as she is going tomorrow.  -Discussed daily foot inspection.  If there are any changes, to call the office immediately.  -Follow-up in 3 months or sooner if any problems are to arise. In the meantime, encouraged to call the office with any questions, concerns, changes symptoms.  Celesta Gentile, DPM

## 2016-12-24 DIAGNOSIS — E11621 Type 2 diabetes mellitus with foot ulcer: Secondary | ICD-10-CM | POA: Diagnosis not present

## 2016-12-24 DIAGNOSIS — L97421 Non-pressure chronic ulcer of left heel and midfoot limited to breakdown of skin: Secondary | ICD-10-CM | POA: Diagnosis not present

## 2016-12-24 DIAGNOSIS — M05172 Rheumatoid lung disease with rheumatoid arthritis of left ankle and foot: Secondary | ICD-10-CM | POA: Diagnosis not present

## 2016-12-24 DIAGNOSIS — E1142 Type 2 diabetes mellitus with diabetic polyneuropathy: Secondary | ICD-10-CM | POA: Diagnosis not present

## 2016-12-24 DIAGNOSIS — K5790 Diverticulosis of intestine, part unspecified, without perforation or abscess without bleeding: Secondary | ICD-10-CM | POA: Diagnosis not present

## 2016-12-24 DIAGNOSIS — I10 Essential (primary) hypertension: Secondary | ICD-10-CM | POA: Diagnosis not present

## 2016-12-27 DIAGNOSIS — E1142 Type 2 diabetes mellitus with diabetic polyneuropathy: Secondary | ICD-10-CM | POA: Diagnosis not present

## 2016-12-27 DIAGNOSIS — E11621 Type 2 diabetes mellitus with foot ulcer: Secondary | ICD-10-CM | POA: Diagnosis not present

## 2016-12-27 DIAGNOSIS — L97421 Non-pressure chronic ulcer of left heel and midfoot limited to breakdown of skin: Secondary | ICD-10-CM | POA: Diagnosis not present

## 2016-12-27 DIAGNOSIS — M05172 Rheumatoid lung disease with rheumatoid arthritis of left ankle and foot: Secondary | ICD-10-CM | POA: Diagnosis not present

## 2016-12-27 DIAGNOSIS — I10 Essential (primary) hypertension: Secondary | ICD-10-CM | POA: Diagnosis not present

## 2016-12-27 DIAGNOSIS — K5790 Diverticulosis of intestine, part unspecified, without perforation or abscess without bleeding: Secondary | ICD-10-CM | POA: Diagnosis not present

## 2016-12-28 ENCOUNTER — Encounter (HOSPITAL_BASED_OUTPATIENT_CLINIC_OR_DEPARTMENT_OTHER): Payer: Medicare Other | Attending: Internal Medicine

## 2016-12-28 DIAGNOSIS — E11621 Type 2 diabetes mellitus with foot ulcer: Secondary | ICD-10-CM | POA: Insufficient documentation

## 2016-12-28 DIAGNOSIS — L97522 Non-pressure chronic ulcer of other part of left foot with fat layer exposed: Secondary | ICD-10-CM | POA: Diagnosis not present

## 2016-12-28 DIAGNOSIS — L84 Corns and callosities: Secondary | ICD-10-CM | POA: Diagnosis not present

## 2016-12-28 DIAGNOSIS — I10 Essential (primary) hypertension: Secondary | ICD-10-CM | POA: Diagnosis not present

## 2016-12-28 DIAGNOSIS — L97523 Non-pressure chronic ulcer of other part of left foot with necrosis of muscle: Secondary | ICD-10-CM | POA: Insufficient documentation

## 2016-12-28 DIAGNOSIS — E1142 Type 2 diabetes mellitus with diabetic polyneuropathy: Secondary | ICD-10-CM | POA: Diagnosis not present

## 2016-12-28 DIAGNOSIS — M05172 Rheumatoid lung disease with rheumatoid arthritis of left ankle and foot: Secondary | ICD-10-CM | POA: Diagnosis not present

## 2016-12-28 DIAGNOSIS — D649 Anemia, unspecified: Secondary | ICD-10-CM | POA: Diagnosis not present

## 2016-12-31 DIAGNOSIS — E1142 Type 2 diabetes mellitus with diabetic polyneuropathy: Secondary | ICD-10-CM | POA: Diagnosis not present

## 2016-12-31 DIAGNOSIS — I10 Essential (primary) hypertension: Secondary | ICD-10-CM | POA: Diagnosis not present

## 2016-12-31 DIAGNOSIS — M05172 Rheumatoid lung disease with rheumatoid arthritis of left ankle and foot: Secondary | ICD-10-CM | POA: Diagnosis not present

## 2016-12-31 DIAGNOSIS — K5790 Diverticulosis of intestine, part unspecified, without perforation or abscess without bleeding: Secondary | ICD-10-CM | POA: Diagnosis not present

## 2016-12-31 DIAGNOSIS — L97421 Non-pressure chronic ulcer of left heel and midfoot limited to breakdown of skin: Secondary | ICD-10-CM | POA: Diagnosis not present

## 2016-12-31 DIAGNOSIS — E11621 Type 2 diabetes mellitus with foot ulcer: Secondary | ICD-10-CM | POA: Diagnosis not present

## 2017-01-01 DIAGNOSIS — E1142 Type 2 diabetes mellitus with diabetic polyneuropathy: Secondary | ICD-10-CM | POA: Diagnosis not present

## 2017-01-01 DIAGNOSIS — F329 Major depressive disorder, single episode, unspecified: Secondary | ICD-10-CM | POA: Diagnosis not present

## 2017-01-01 DIAGNOSIS — M05172 Rheumatoid lung disease with rheumatoid arthritis of left ankle and foot: Secondary | ICD-10-CM | POA: Diagnosis not present

## 2017-01-01 DIAGNOSIS — I10 Essential (primary) hypertension: Secondary | ICD-10-CM | POA: Diagnosis not present

## 2017-01-01 DIAGNOSIS — E11621 Type 2 diabetes mellitus with foot ulcer: Secondary | ICD-10-CM | POA: Diagnosis not present

## 2017-01-01 DIAGNOSIS — L97421 Non-pressure chronic ulcer of left heel and midfoot limited to breakdown of skin: Secondary | ICD-10-CM | POA: Diagnosis not present

## 2017-01-01 DIAGNOSIS — E039 Hypothyroidism, unspecified: Secondary | ICD-10-CM | POA: Diagnosis not present

## 2017-01-01 DIAGNOSIS — D649 Anemia, unspecified: Secondary | ICD-10-CM | POA: Diagnosis not present

## 2017-01-01 DIAGNOSIS — Z7984 Long term (current) use of oral hypoglycemic drugs: Secondary | ICD-10-CM | POA: Diagnosis not present

## 2017-01-01 DIAGNOSIS — K5790 Diverticulosis of intestine, part unspecified, without perforation or abscess without bleeding: Secondary | ICD-10-CM | POA: Diagnosis not present

## 2017-01-02 DIAGNOSIS — M05172 Rheumatoid lung disease with rheumatoid arthritis of left ankle and foot: Secondary | ICD-10-CM | POA: Diagnosis not present

## 2017-01-02 DIAGNOSIS — E11621 Type 2 diabetes mellitus with foot ulcer: Secondary | ICD-10-CM | POA: Diagnosis not present

## 2017-01-02 DIAGNOSIS — L97421 Non-pressure chronic ulcer of left heel and midfoot limited to breakdown of skin: Secondary | ICD-10-CM | POA: Diagnosis not present

## 2017-01-02 DIAGNOSIS — E1142 Type 2 diabetes mellitus with diabetic polyneuropathy: Secondary | ICD-10-CM | POA: Diagnosis not present

## 2017-01-02 DIAGNOSIS — I10 Essential (primary) hypertension: Secondary | ICD-10-CM | POA: Diagnosis not present

## 2017-01-02 DIAGNOSIS — K5790 Diverticulosis of intestine, part unspecified, without perforation or abscess without bleeding: Secondary | ICD-10-CM | POA: Diagnosis not present

## 2017-01-04 ENCOUNTER — Other Ambulatory Visit (HOSPITAL_COMMUNITY)
Admission: RE | Admit: 2017-01-04 | Discharge: 2017-01-04 | Disposition: A | Discharge status: Home / Self Care | Payer: Medicare Other | Source: Other Acute Inpatient Hospital | Attending: Internal Medicine | Admitting: Internal Medicine

## 2017-01-04 DIAGNOSIS — E1142 Type 2 diabetes mellitus with diabetic polyneuropathy: Secondary | ICD-10-CM | POA: Diagnosis not present

## 2017-01-04 DIAGNOSIS — L97523 Non-pressure chronic ulcer of other part of left foot with necrosis of muscle: Secondary | ICD-10-CM | POA: Insufficient documentation

## 2017-01-04 DIAGNOSIS — I10 Essential (primary) hypertension: Secondary | ICD-10-CM | POA: Diagnosis not present

## 2017-01-04 DIAGNOSIS — D649 Anemia, unspecified: Secondary | ICD-10-CM | POA: Diagnosis not present

## 2017-01-04 DIAGNOSIS — E11621 Type 2 diabetes mellitus with foot ulcer: Secondary | ICD-10-CM | POA: Insufficient documentation

## 2017-01-04 DIAGNOSIS — M05172 Rheumatoid lung disease with rheumatoid arthritis of left ankle and foot: Secondary | ICD-10-CM | POA: Diagnosis not present

## 2017-01-04 DIAGNOSIS — L97522 Non-pressure chronic ulcer of other part of left foot with fat layer exposed: Secondary | ICD-10-CM | POA: Diagnosis not present

## 2017-01-07 DIAGNOSIS — I10 Essential (primary) hypertension: Secondary | ICD-10-CM | POA: Diagnosis not present

## 2017-01-07 DIAGNOSIS — E1142 Type 2 diabetes mellitus with diabetic polyneuropathy: Secondary | ICD-10-CM | POA: Diagnosis not present

## 2017-01-07 DIAGNOSIS — L97421 Non-pressure chronic ulcer of left heel and midfoot limited to breakdown of skin: Secondary | ICD-10-CM | POA: Diagnosis not present

## 2017-01-07 DIAGNOSIS — E11621 Type 2 diabetes mellitus with foot ulcer: Secondary | ICD-10-CM | POA: Diagnosis not present

## 2017-01-07 DIAGNOSIS — M05172 Rheumatoid lung disease with rheumatoid arthritis of left ankle and foot: Secondary | ICD-10-CM | POA: Diagnosis not present

## 2017-01-07 DIAGNOSIS — K5790 Diverticulosis of intestine, part unspecified, without perforation or abscess without bleeding: Secondary | ICD-10-CM | POA: Diagnosis not present

## 2017-01-07 LAB — AEROBIC CULTURE W GRAM STAIN (SUPERFICIAL SPECIMEN): Culture: NORMAL

## 2017-01-07 LAB — AEROBIC CULTURE  (SUPERFICIAL SPECIMEN)

## 2017-01-09 DIAGNOSIS — E11621 Type 2 diabetes mellitus with foot ulcer: Secondary | ICD-10-CM | POA: Diagnosis not present

## 2017-01-09 DIAGNOSIS — E1142 Type 2 diabetes mellitus with diabetic polyneuropathy: Secondary | ICD-10-CM | POA: Diagnosis not present

## 2017-01-09 DIAGNOSIS — M05172 Rheumatoid lung disease with rheumatoid arthritis of left ankle and foot: Secondary | ICD-10-CM | POA: Diagnosis not present

## 2017-01-09 DIAGNOSIS — I10 Essential (primary) hypertension: Secondary | ICD-10-CM | POA: Diagnosis not present

## 2017-01-09 DIAGNOSIS — K5790 Diverticulosis of intestine, part unspecified, without perforation or abscess without bleeding: Secondary | ICD-10-CM | POA: Diagnosis not present

## 2017-01-09 DIAGNOSIS — L97421 Non-pressure chronic ulcer of left heel and midfoot limited to breakdown of skin: Secondary | ICD-10-CM | POA: Diagnosis not present

## 2017-01-10 ENCOUNTER — Other Ambulatory Visit (HOSPITAL_COMMUNITY): Payer: Self-pay | Admitting: *Deleted

## 2017-01-10 ENCOUNTER — Ambulatory Visit (HOSPITAL_COMMUNITY)
Admission: RE | Admit: 2017-01-10 | Discharge: 2017-01-10 | Disposition: A | Discharge status: Home / Self Care | Payer: Medicare Other | Source: Ambulatory Visit | Attending: Internal Medicine | Admitting: Internal Medicine

## 2017-01-10 ENCOUNTER — Other Ambulatory Visit: Payer: Self-pay | Admitting: Internal Medicine

## 2017-01-10 DIAGNOSIS — E1142 Type 2 diabetes mellitus with diabetic polyneuropathy: Secondary | ICD-10-CM | POA: Diagnosis not present

## 2017-01-10 DIAGNOSIS — L97523 Non-pressure chronic ulcer of other part of left foot with necrosis of muscle: Secondary | ICD-10-CM | POA: Diagnosis not present

## 2017-01-10 DIAGNOSIS — M86172 Other acute osteomyelitis, left ankle and foot: Secondary | ICD-10-CM | POA: Insufficient documentation

## 2017-01-10 DIAGNOSIS — E11621 Type 2 diabetes mellitus with foot ulcer: Secondary | ICD-10-CM | POA: Diagnosis not present

## 2017-01-10 DIAGNOSIS — S91302A Unspecified open wound, left foot, initial encounter: Secondary | ICD-10-CM | POA: Diagnosis not present

## 2017-01-10 DIAGNOSIS — D649 Anemia, unspecified: Secondary | ICD-10-CM | POA: Diagnosis not present

## 2017-01-10 DIAGNOSIS — M05172 Rheumatoid lung disease with rheumatoid arthritis of left ankle and foot: Secondary | ICD-10-CM | POA: Diagnosis not present

## 2017-01-10 DIAGNOSIS — I10 Essential (primary) hypertension: Secondary | ICD-10-CM | POA: Diagnosis not present

## 2017-01-10 DIAGNOSIS — L97522 Non-pressure chronic ulcer of other part of left foot with fat layer exposed: Secondary | ICD-10-CM | POA: Diagnosis not present

## 2017-01-14 DIAGNOSIS — H353132 Nonexudative age-related macular degeneration, bilateral, intermediate dry stage: Secondary | ICD-10-CM | POA: Diagnosis not present

## 2017-01-14 DIAGNOSIS — K5790 Diverticulosis of intestine, part unspecified, without perforation or abscess without bleeding: Secondary | ICD-10-CM | POA: Diagnosis not present

## 2017-01-14 DIAGNOSIS — L97421 Non-pressure chronic ulcer of left heel and midfoot limited to breakdown of skin: Secondary | ICD-10-CM | POA: Diagnosis not present

## 2017-01-14 DIAGNOSIS — M05172 Rheumatoid lung disease with rheumatoid arthritis of left ankle and foot: Secondary | ICD-10-CM | POA: Diagnosis not present

## 2017-01-14 DIAGNOSIS — E11621 Type 2 diabetes mellitus with foot ulcer: Secondary | ICD-10-CM | POA: Diagnosis not present

## 2017-01-14 DIAGNOSIS — H52203 Unspecified astigmatism, bilateral: Secondary | ICD-10-CM | POA: Diagnosis not present

## 2017-01-14 DIAGNOSIS — E1142 Type 2 diabetes mellitus with diabetic polyneuropathy: Secondary | ICD-10-CM | POA: Diagnosis not present

## 2017-01-14 DIAGNOSIS — Z79899 Other long term (current) drug therapy: Secondary | ICD-10-CM | POA: Diagnosis not present

## 2017-01-14 DIAGNOSIS — Z961 Presence of intraocular lens: Secondary | ICD-10-CM | POA: Diagnosis not present

## 2017-01-14 DIAGNOSIS — I10 Essential (primary) hypertension: Secondary | ICD-10-CM | POA: Diagnosis not present

## 2017-01-15 DIAGNOSIS — E7800 Pure hypercholesterolemia, unspecified: Secondary | ICD-10-CM | POA: Diagnosis not present

## 2017-01-15 DIAGNOSIS — Z0000 Encounter for general adult medical examination without abnormal findings: Secondary | ICD-10-CM | POA: Diagnosis not present

## 2017-01-15 DIAGNOSIS — E119 Type 2 diabetes mellitus without complications: Secondary | ICD-10-CM | POA: Diagnosis not present

## 2017-01-16 DIAGNOSIS — E11621 Type 2 diabetes mellitus with foot ulcer: Secondary | ICD-10-CM | POA: Diagnosis not present

## 2017-01-16 DIAGNOSIS — K5790 Diverticulosis of intestine, part unspecified, without perforation or abscess without bleeding: Secondary | ICD-10-CM | POA: Diagnosis not present

## 2017-01-16 DIAGNOSIS — M05172 Rheumatoid lung disease with rheumatoid arthritis of left ankle and foot: Secondary | ICD-10-CM | POA: Diagnosis not present

## 2017-01-16 DIAGNOSIS — E1142 Type 2 diabetes mellitus with diabetic polyneuropathy: Secondary | ICD-10-CM | POA: Diagnosis not present

## 2017-01-16 DIAGNOSIS — L97421 Non-pressure chronic ulcer of left heel and midfoot limited to breakdown of skin: Secondary | ICD-10-CM | POA: Diagnosis not present

## 2017-01-16 DIAGNOSIS — I10 Essential (primary) hypertension: Secondary | ICD-10-CM | POA: Diagnosis not present

## 2017-01-17 DIAGNOSIS — I10 Essential (primary) hypertension: Secondary | ICD-10-CM | POA: Diagnosis not present

## 2017-01-17 DIAGNOSIS — E11621 Type 2 diabetes mellitus with foot ulcer: Secondary | ICD-10-CM | POA: Diagnosis not present

## 2017-01-17 DIAGNOSIS — L97523 Non-pressure chronic ulcer of other part of left foot with necrosis of muscle: Secondary | ICD-10-CM | POA: Diagnosis not present

## 2017-01-17 DIAGNOSIS — D649 Anemia, unspecified: Secondary | ICD-10-CM | POA: Diagnosis not present

## 2017-01-17 DIAGNOSIS — M05172 Rheumatoid lung disease with rheumatoid arthritis of left ankle and foot: Secondary | ICD-10-CM | POA: Diagnosis not present

## 2017-01-17 DIAGNOSIS — E1142 Type 2 diabetes mellitus with diabetic polyneuropathy: Secondary | ICD-10-CM | POA: Diagnosis not present

## 2017-01-17 DIAGNOSIS — L97521 Non-pressure chronic ulcer of other part of left foot limited to breakdown of skin: Secondary | ICD-10-CM | POA: Diagnosis not present

## 2017-01-21 DIAGNOSIS — E7800 Pure hypercholesterolemia, unspecified: Secondary | ICD-10-CM | POA: Diagnosis not present

## 2017-01-21 DIAGNOSIS — M05172 Rheumatoid lung disease with rheumatoid arthritis of left ankle and foot: Secondary | ICD-10-CM | POA: Diagnosis not present

## 2017-01-21 DIAGNOSIS — E119 Type 2 diabetes mellitus without complications: Secondary | ICD-10-CM | POA: Diagnosis not present

## 2017-01-21 DIAGNOSIS — K5790 Diverticulosis of intestine, part unspecified, without perforation or abscess without bleeding: Secondary | ICD-10-CM | POA: Diagnosis not present

## 2017-01-21 DIAGNOSIS — L97421 Non-pressure chronic ulcer of left heel and midfoot limited to breakdown of skin: Secondary | ICD-10-CM | POA: Diagnosis not present

## 2017-01-21 DIAGNOSIS — E1142 Type 2 diabetes mellitus with diabetic polyneuropathy: Secondary | ICD-10-CM | POA: Diagnosis not present

## 2017-01-21 DIAGNOSIS — I10 Essential (primary) hypertension: Secondary | ICD-10-CM | POA: Diagnosis not present

## 2017-01-21 DIAGNOSIS — E11621 Type 2 diabetes mellitus with foot ulcer: Secondary | ICD-10-CM | POA: Diagnosis not present

## 2017-01-22 DIAGNOSIS — Z89422 Acquired absence of other left toe(s): Secondary | ICD-10-CM | POA: Diagnosis not present

## 2017-01-22 DIAGNOSIS — Z87898 Personal history of other specified conditions: Secondary | ICD-10-CM | POA: Diagnosis not present

## 2017-01-22 DIAGNOSIS — E1149 Type 2 diabetes mellitus with other diabetic neurological complication: Secondary | ICD-10-CM | POA: Diagnosis not present

## 2017-01-22 DIAGNOSIS — L97509 Non-pressure chronic ulcer of other part of unspecified foot with unspecified severity: Secondary | ICD-10-CM | POA: Diagnosis not present

## 2017-01-23 DIAGNOSIS — L97421 Non-pressure chronic ulcer of left heel and midfoot limited to breakdown of skin: Secondary | ICD-10-CM | POA: Diagnosis not present

## 2017-01-23 DIAGNOSIS — E11621 Type 2 diabetes mellitus with foot ulcer: Secondary | ICD-10-CM | POA: Diagnosis not present

## 2017-01-23 DIAGNOSIS — I10 Essential (primary) hypertension: Secondary | ICD-10-CM | POA: Diagnosis not present

## 2017-01-23 DIAGNOSIS — E1142 Type 2 diabetes mellitus with diabetic polyneuropathy: Secondary | ICD-10-CM | POA: Diagnosis not present

## 2017-01-23 DIAGNOSIS — M05172 Rheumatoid lung disease with rheumatoid arthritis of left ankle and foot: Secondary | ICD-10-CM | POA: Diagnosis not present

## 2017-01-23 DIAGNOSIS — K5790 Diverticulosis of intestine, part unspecified, without perforation or abscess without bleeding: Secondary | ICD-10-CM | POA: Diagnosis not present

## 2017-01-28 DIAGNOSIS — I10 Essential (primary) hypertension: Secondary | ICD-10-CM | POA: Diagnosis not present

## 2017-01-28 DIAGNOSIS — E1142 Type 2 diabetes mellitus with diabetic polyneuropathy: Secondary | ICD-10-CM | POA: Diagnosis not present

## 2017-01-28 DIAGNOSIS — M05172 Rheumatoid lung disease with rheumatoid arthritis of left ankle and foot: Secondary | ICD-10-CM | POA: Diagnosis not present

## 2017-01-28 DIAGNOSIS — L97421 Non-pressure chronic ulcer of left heel and midfoot limited to breakdown of skin: Secondary | ICD-10-CM | POA: Diagnosis not present

## 2017-01-28 DIAGNOSIS — E11621 Type 2 diabetes mellitus with foot ulcer: Secondary | ICD-10-CM | POA: Diagnosis not present

## 2017-01-28 DIAGNOSIS — K5790 Diverticulosis of intestine, part unspecified, without perforation or abscess without bleeding: Secondary | ICD-10-CM | POA: Diagnosis not present

## 2017-01-30 DIAGNOSIS — I10 Essential (primary) hypertension: Secondary | ICD-10-CM | POA: Diagnosis not present

## 2017-01-30 DIAGNOSIS — E1142 Type 2 diabetes mellitus with diabetic polyneuropathy: Secondary | ICD-10-CM | POA: Diagnosis not present

## 2017-01-30 DIAGNOSIS — M05172 Rheumatoid lung disease with rheumatoid arthritis of left ankle and foot: Secondary | ICD-10-CM | POA: Diagnosis not present

## 2017-01-30 DIAGNOSIS — E11621 Type 2 diabetes mellitus with foot ulcer: Secondary | ICD-10-CM | POA: Diagnosis not present

## 2017-01-30 DIAGNOSIS — L97421 Non-pressure chronic ulcer of left heel and midfoot limited to breakdown of skin: Secondary | ICD-10-CM | POA: Diagnosis not present

## 2017-01-30 DIAGNOSIS — K5790 Diverticulosis of intestine, part unspecified, without perforation or abscess without bleeding: Secondary | ICD-10-CM | POA: Diagnosis not present

## 2017-01-31 ENCOUNTER — Encounter (HOSPITAL_BASED_OUTPATIENT_CLINIC_OR_DEPARTMENT_OTHER): Payer: Medicare Other | Attending: Internal Medicine

## 2017-01-31 DIAGNOSIS — Z8631 Personal history of diabetic foot ulcer: Secondary | ICD-10-CM | POA: Insufficient documentation

## 2017-01-31 DIAGNOSIS — E119 Type 2 diabetes mellitus without complications: Secondary | ICD-10-CM | POA: Insufficient documentation

## 2017-01-31 DIAGNOSIS — L97521 Non-pressure chronic ulcer of other part of left foot limited to breakdown of skin: Secondary | ICD-10-CM | POA: Diagnosis not present

## 2017-01-31 DIAGNOSIS — L84 Corns and callosities: Secondary | ICD-10-CM | POA: Insufficient documentation

## 2017-01-31 DIAGNOSIS — Z09 Encounter for follow-up examination after completed treatment for conditions other than malignant neoplasm: Secondary | ICD-10-CM | POA: Insufficient documentation

## 2017-01-31 DIAGNOSIS — I10 Essential (primary) hypertension: Secondary | ICD-10-CM | POA: Insufficient documentation

## 2017-01-31 DIAGNOSIS — Z89412 Acquired absence of left great toe: Secondary | ICD-10-CM | POA: Diagnosis not present

## 2017-02-01 ENCOUNTER — Emergency Department (HOSPITAL_COMMUNITY): Payer: Medicare Other

## 2017-02-01 ENCOUNTER — Emergency Department (HOSPITAL_COMMUNITY)
Admission: EM | Admit: 2017-02-01 | Discharge: 2017-02-02 | Disposition: A | Discharge status: Home / Self Care | Payer: Medicare Other | Attending: Emergency Medicine | Admitting: Emergency Medicine

## 2017-02-01 ENCOUNTER — Encounter (HOSPITAL_COMMUNITY): Payer: Self-pay | Admitting: Emergency Medicine

## 2017-02-01 DIAGNOSIS — Y929 Unspecified place or not applicable: Secondary | ICD-10-CM | POA: Insufficient documentation

## 2017-02-01 DIAGNOSIS — W228XXA Striking against or struck by other objects, initial encounter: Secondary | ICD-10-CM | POA: Diagnosis not present

## 2017-02-01 DIAGNOSIS — S0990XA Unspecified injury of head, initial encounter: Secondary | ICD-10-CM | POA: Diagnosis not present

## 2017-02-01 DIAGNOSIS — Y999 Unspecified external cause status: Secondary | ICD-10-CM | POA: Diagnosis not present

## 2017-02-01 DIAGNOSIS — S51019A Laceration without foreign body of unspecified elbow, initial encounter: Secondary | ICD-10-CM

## 2017-02-01 DIAGNOSIS — Z7984 Long term (current) use of oral hypoglycemic drugs: Secondary | ICD-10-CM | POA: Diagnosis not present

## 2017-02-01 DIAGNOSIS — S098XXA Other specified injuries of head, initial encounter: Secondary | ICD-10-CM | POA: Diagnosis not present

## 2017-02-01 DIAGNOSIS — S51011A Laceration without foreign body of right elbow, initial encounter: Secondary | ICD-10-CM | POA: Diagnosis not present

## 2017-02-01 DIAGNOSIS — E119 Type 2 diabetes mellitus without complications: Secondary | ICD-10-CM | POA: Diagnosis not present

## 2017-02-01 DIAGNOSIS — Y939 Activity, unspecified: Secondary | ICD-10-CM | POA: Diagnosis not present

## 2017-02-01 DIAGNOSIS — S0083XA Contusion of other part of head, initial encounter: Secondary | ICD-10-CM | POA: Diagnosis not present

## 2017-02-01 DIAGNOSIS — Z79899 Other long term (current) drug therapy: Secondary | ICD-10-CM | POA: Insufficient documentation

## 2017-02-01 DIAGNOSIS — S0003XA Contusion of scalp, initial encounter: Secondary | ICD-10-CM | POA: Diagnosis not present

## 2017-02-01 MED ORDER — LIDOCAINE HCL 2 % IJ SOLN
10.0000 mL | Freq: Once | INTRAMUSCULAR | Status: DC
  Filled 2017-02-01: qty 20

## 2017-02-01 NOTE — ED Triage Notes (Signed)
Pt presents from home by EMS for evaluation of fall. EMS advised pt was standing and shaking blanket lost balance and fell hitting head and skin tear to right forearm. Pt denied any LOC.

## 2017-02-01 NOTE — ED Provider Notes (Signed)
[facility] DEPT Provider Note   CSN: [id number] Arrival date & time: 02/01/17  2305   By signing my name below, I, [name], attest that this documentation has been prepared under the direction and in the presence of Orpah Greek, MD. Electronically signed, [name], ED Scribe. 02/01/17. 12:19 AM.   History   Chief Complaint Chief Complaint  Patient presents with  . Fall   The history is provided by the patient and medical records. No language interpreter was used.    [name] is a 83 y.o. female BIB EMS with h/o rheumatoid arthritis, who presents to the Emergency Department with concern for R posterior head soreness s/p an unwitnessed mechanical fall that occurred PTA. Pt allegedly shook a blanket and lost her balance, then fell backward and hit her head on a marble surface. She notes 1/10 posterior head soreness and a R elbow skin tear currently. No treatments PTA. No modifying factors noted. She states she is ambulatory with walker at baseline. Pt lives alone. No leg, knee, ankle, hip, neck, shoulder or back pain noted. No LOC. No recent falls otherwise. Last tetanus unknown.  Past Medical History:  Diagnosis Date  . Acute bronchitis   . Anemia, unspecified   . Depressive disorder, not elsewhere classified   . Diverticulosis of colon (without mention of hemorrhage)   . Esophageal reflux   . Family history of malignant neoplasm of gastrointestinal tract   . Functional diarrhea   . Hiatal hernia   . Other and unspecified hyperlipidemia   . Other chest pain   . Other specified cardiac dysrhythmias(427.89)   . Personal history of colonic polyps 07/17/1995   adenomatous polyps  . PONV (postoperative nausea and vomiting)   . Rheumatoid arthritis(714.0)   . Tachycardia, unspecified   . Type II or unspecified type diabetes mellitus with other specified manifestations, not stated as uncontrolled   . Unspecified adverse effect of unspecified drug,  medicinal and biological substance   . Unspecified essential hypertension   . Unspecified hypothyroidism     Patient Active Problem List   Diagnosis Date Noted  . Diabetic ulcer of left foot associated with type 2 diabetes mellitus ([place]) 08/11/2015  . Closed right hip fracture ([place]) 08/28/2012  . Rheumatoid arthritis(714.0) 08/28/2012  . History of gastroesophageal reflux (GERD) 06/26/2011  . Diverticulosis 06/26/2011  . Lower GI bleeding 06/26/2011  . DEPRESSION 03/31/2009  . DIARRHEA, FUNCTIONAL 03/31/2009  . DIABETIC HYPOGLYCEMIA, TYPE II 11/03/2008  . ANEMIA, MILD 11/03/2008  . ACUTE BRONCHITIS 08/23/2008  . AV NODAL REENTRY TACHYCARDIA 10/29/2007  . TACHYCARDIA 10/29/2007  . CHEST PAIN, ATYPICAL 10/25/2007  . ADVEF, DRUG/MEDICINAL/BIOLOGICAL SUBST NOS 07/16/2007  . Nonspecific (abnormal) findings on radiological and other examination of body structure 04/03/2007  . CHEST XRAY, ABNORMAL 04/03/2007  . HYPOTHYROIDISM 03/06/2007  . DIABETES MELLITUS, TYPE II 03/06/2007  . HYPERLIPIDEMIA 03/06/2007  . HYPERTENSION 03/06/2007  . DIVERTICULOSIS, COLON 03/06/2007  . ARTHRITIS, RHEUMATOID, HX OF 03/06/2007    Past Surgical History:  Procedure Laterality Date  . CATARACT EXTRACTION     left  . ORIF FEMUR FRACTURE  08/30/2012   Procedure: OPEN REDUCTION INTERNAL FIXATION (ORIF) DISTAL FEMUR FRACTURE;  Surgeon: Mauri Pole, MD;  Location: [facility];  Service: Orthopedics;  Laterality: Right;  proximal femur  . PARTIAL HYSTERECTOMY    . THYROIDECTOMY, PARTIAL      OB History    No data available       Home Medications    Prior  to Admission medications   Medication Sig Start Date End Date Taking? Authorizing Provider  alendronate (FOSAMAX) 70 MG tablet  07/15/13   [provider]  cadexomer iodine (IODOSORB) 0.9 % gel Apply to affected area daily. 06/26/16   Trula Slade, DPM  calcium-vitamin D (OSCAL WITH D) 500-200 MG-UNIT per tablet Take 2 tablets by mouth  daily.    [provider]  cephALEXin (KEFLEX) 500 MG capsule Take 1 capsule (500 mg total) by mouth 3 (three) times daily. 06/25/16   Trula Slade, DPM  DUREZOL 0.05 % EMUL  11/04/13   [provider]  folic acid (FOLVITE) 1 MG tablet Take 1 mg by mouth daily.      [provider]  glimepiride (AMARYL) 1 MG tablet Take 0.5 mg by mouth daily.     [provider]  hydroxychloroquine (PLAQUENIL) 200 MG tablet Take 200 mg by mouth 2 (two) times daily.      [provider]  levothyroxine (SYNTHROID, LEVOTHROID) 137 MCG tablet Take 137 mcg by mouth every morning.     [provider]  metFORMIN (GLUCOPHAGE-XR) 500 MG 24 hr tablet  03/07/15   [provider]  methotrexate (RHEUMATREX) 2.5 MG tablet Take 25 mg by mouth every Wednesday. Take 10 pills by mouth every Wednesday    [provider]  methotrexate (RHEUMATREX) 2.5 MG tablet  09/28/14   [provider]  mirtazapine (REMERON) 15 MG tablet  07/28/13   [provider]  Multiple Vitamin (MULTIVITAMIN WITH MINERALS) TABS Take 1 tablet by mouth daily.    [provider]  mupirocin ointment (BACTROBAN) 2 % Apply to wound twice a day. 03/10/15   Hyatt, Max T, DPM  omeprazole (PRILOSEC) 40 MG capsule  01/31/15   [provider]  Pitavastatin Calcium (LIVALO) 2 MG TABS Take 2 mg by mouth every Monday, Wednesday, and Friday at 6 PM.     [provider]  silver sulfADIAZINE (SILVADENE) 1 % cream Apply 1 application topically daily. 11/18/15   Trula Slade, DPM  SitaGLIPtin Phosphate (JANUVIA PO) Take by mouth.    [provider]  SSD 1 % cream APPLY TO THE AFFECTED AREA ONCE A DAY. 09/08/15   Trula Slade, DPM  SYNTHROID 150 MCG tablet  09/30/14   [provider]  telmisartan (MICARDIS) 80 MG tablet Take 40 mg by mouth daily.     [provider]  VIGAMOX 0.5 % ophthalmic solution  11/04/13   [provider]  VOLTAREN 1 % GEL  11/30/13   [provider]    Family History Family History  Problem Relation Age of Onset  . Colon cancer Maternal Grandmother     Social History Social History  Substance Use Topics  . Smoking status: Never Smoker  . Smokeless tobacco: Never Used  . Alcohol use Yes     Comment: one glass of wine every Friday night.     Allergies   Patient has no known allergies.   Review of Systems Review of Systems   All other systems reviewed and all systems are negative for acute changes except as noted in the HPI and PMH.    Physical Exam Updated Vital Signs BP 138/78 (BP Location: Left Arm)   Pulse 99   Temp 98 F (36.7 C) (Oral)   Resp 16   Ht 5' 6.5" (1.689 m)   Wt 105 lb (47.6 kg)   SpO2 98%   BMI 16.69 kg/m  Physical Exam  Constitutional: She is oriented to person, place, and time. She appears well-developed and well-nourished. No distress.  HENT:  Head: Normocephalic and atraumatic.  Right Ear: Hearing normal.  Left Ear: Hearing normal.  Nose: Nose normal.  Mouth/Throat: Oropharynx is clear and moist and mucous membranes are normal.  Eyes: Conjunctivae and EOM are normal. Pupils are equal, round, and reactive to light.  Neck: Normal range of motion. Neck supple.  Cardiovascular: Regular rhythm, S1 normal and S2 normal.  Exam reveals no gallop and no friction rub.   No murmur heard. Pulmonary/Chest: Effort normal and breath sounds normal. No respiratory distress. She exhibits no tenderness.  Abdominal: Soft. Normal appearance and bowel sounds are normal. There is no hepatosplenomegaly. There is no tenderness. There is no rebound, no guarding, no tenderness at McBurney's point and negative Murphy's sign. No hernia.  Musculoskeletal: Normal range of motion.  R parietal hematoma and contusion with abrasion; no repairable lacerations  Neurological: She is alert and oriented to person, place, and time. She has normal strength.  No cranial nerve deficit or sensory deficit. Coordination normal. GCS eye subscore is 4. GCS verbal subscore is 5. GCS motor subscore is 6.  Skin: Skin is warm, dry and intact. No rash noted. No cyanosis.  Skin tear R elbow  Psychiatric: She has a normal mood and affect. Her speech is normal and behavior is normal. Thought content normal.  Nursing note and vitals reviewed.    ED Treatments / Results  DIAGNOSTIC STUDIES: Oxygen Saturation is 98% on RA, NL by my interpretation.    COORDINATION OF CARE: 11:40 PM-Discussed next steps with pt. Pt verbalized understanding and is agreeable with the plan. Will order imaging.   Labs (all labs ordered are listed, but only abnormal results are displayed) Labs Reviewed - No data to display  EKG  EKG Interpretation None       Radiology Ct Head Wo Contrast  Result Date: 02/02/2017 CLINICAL DATA:  Patient lost balance and fell, striking head. No loss of consciousness. EXAM: CT HEAD WITHOUT CONTRAST TECHNIQUE: Contiguous axial images were obtained from the base of the skull through the vertex without intravenous contrast. COMPARISON:  06/10/2003 FINDINGS: Brain: Diffuse cerebral atrophy. Ventricular dilatation consistent with central atrophy. Low-attenuation changes in the deep white matter likely representing small vessel ischemia. No mass effect or midline shift. No abnormal extra-axial fluid collections. Gray-white matter junctions are distinct. Basal cisterns are not effaced. No acute intracranial hemorrhage. Vascular: Vascular calcifications in the carotid siphons and vertebrobasilar arteries. Skull: Calvarium appears intact. Sinuses/Orbits: Paranasal sinuses and mastoid air cells are clear. Other: Subcutaneous scalp hematoma over the right posterior parietal region. IMPRESSION: No acute intracranial abnormalities. Chronic atrophy and small vessel ischemic changes. Subcutaneous scalp hematoma in the right parietal region. Electronically Signed    By: Lucienne Capers M.D.   On: 02/02/2017 00:38    Procedures Procedures (including critical care time)  Medications Ordered in ED Medications  lidocaine (XYLOCAINE) 2 % (with pres) injection 200 mg (200 mg Infiltration Not Given 02/02/17 0024)     Initial Impression / Assessment and Plan / ED Course  I have reviewed the triage vital signs and the nursing notes.  Pertinent labs & imaging results that were available during my care of the patient were reviewed by me and considered in my medical decision making (see chart for details).    Patient presents after a mechanical fall. She lost her balance while she was trying to shake out her dog  blankets. She hit her head on a marble. No loss of consciousness. Patient has a large hematoma on the right parietal scalp. There is no laceration to repair. CT head does not show any intracranial injury. She does not have any neck or back pain. The only other area of injury was a superficial skin tear of the right elbow, does not require any other imaging.   Final Clinical Impressions(s) / ED Diagnoses   Final diagnoses:  Contusion of scalp, initial encounter  Skin tear of elbow without complication, initial encounter    New Prescriptions New Prescriptions   No medications on file  I personally performed the services described in this documentation, which was scribed in my presence. The recorded information has been reviewed and is accurate.    Orpah Greek, MD 02/02/17 [id number]

## 2017-02-02 DIAGNOSIS — S0990XA Unspecified injury of head, initial encounter: Secondary | ICD-10-CM | POA: Diagnosis not present

## 2017-02-02 NOTE — ED Notes (Signed)
[employer] notified of need for transport of pt back to residence.  

## 2017-02-02 NOTE — ED Notes (Signed)
Pt son attempted to be contacted multiple times without answer.

## 2017-02-02 NOTE — ED Notes (Signed)
Delay in pt discharge. Staff attempted to call pt son multiple times for discharge transport. Unable to contact son. [facility] was called for discharge transport. Pts son was contacted by [employer] for wellness check to make sure someone would be home upon their arrival. After speaking with [employer], son informed staff that he will be coming to the hospital to pick up his mother.

## 2017-02-04 DIAGNOSIS — M810 Age-related osteoporosis without current pathological fracture: Secondary | ICD-10-CM | POA: Diagnosis not present

## 2017-02-04 DIAGNOSIS — M79643 Pain in unspecified hand: Secondary | ICD-10-CM | POA: Diagnosis not present

## 2017-02-04 DIAGNOSIS — M0579 Rheumatoid arthritis with rheumatoid factor of multiple sites without organ or systems involvement: Secondary | ICD-10-CM | POA: Diagnosis not present

## 2017-02-04 DIAGNOSIS — Z79899 Other long term (current) drug therapy: Secondary | ICD-10-CM | POA: Diagnosis not present

## 2017-02-12 DIAGNOSIS — H6123 Impacted cerumen, bilateral: Secondary | ICD-10-CM | POA: Diagnosis not present

## 2017-02-13 DIAGNOSIS — L821 Other seborrheic keratosis: Secondary | ICD-10-CM | POA: Diagnosis not present

## 2017-02-13 DIAGNOSIS — L218 Other seborrheic dermatitis: Secondary | ICD-10-CM | POA: Diagnosis not present

## 2017-02-14 ENCOUNTER — Encounter: Payer: Self-pay | Admitting: Podiatry

## 2017-02-14 ENCOUNTER — Ambulatory Visit (INDEPENDENT_AMBULATORY_CARE_PROVIDER_SITE_OTHER): Payer: Medicare Other | Admitting: Podiatry

## 2017-02-14 ENCOUNTER — Telehealth: Payer: Self-pay | Admitting: *Deleted

## 2017-02-14 DIAGNOSIS — E1140 Type 2 diabetes mellitus with diabetic neuropathy, unspecified: Secondary | ICD-10-CM | POA: Diagnosis not present

## 2017-02-14 DIAGNOSIS — L84 Corns and callosities: Secondary | ICD-10-CM | POA: Diagnosis not present

## 2017-02-14 DIAGNOSIS — M79676 Pain in unspecified toe(s): Secondary | ICD-10-CM

## 2017-02-14 DIAGNOSIS — Q828 Other specified congenital malformations of skin: Secondary | ICD-10-CM | POA: Diagnosis not present

## 2017-02-14 DIAGNOSIS — B351 Tinea unguium: Secondary | ICD-10-CM

## 2017-02-14 DIAGNOSIS — R2681 Unsteadiness on feet: Secondary | ICD-10-CM

## 2017-02-14 NOTE — Telephone Encounter (Addendum)
-----   Message from Trula Slade, DPM sent at 02/14/2017 11:46 AM EDT ----- Can you please order home PT for gait training? Thanks. Orders faxed to [facility]. 02/19/2017-Faxed required form to [facility] for home PT focusing on strength training, gait and balance training.02/20/2017-[facility] [phone] sent Email stating they were accepting pt for home PT.02/21/2017-Laura Patterson - [facility] states would like orders for PT for 2 times week for 4 weeks.02/22/2017-Left message informing Sheralyn Boatman, Dr. Berton Lan her recommended PT schedule.02/27/2017-Pt states she is checking on status of the diabetic shoe paperwork, Dr. Shelia Media manages her diabetes.

## 2017-02-15 ENCOUNTER — Telehealth: Payer: Self-pay | Admitting: Podiatry

## 2017-02-15 DIAGNOSIS — R2681 Unsteadiness on feet: Secondary | ICD-10-CM

## 2017-02-15 NOTE — Telephone Encounter (Signed)
Pt. Said she received a call from whom ever you referred her out to and they said her insurance will not cover the equipment she needs.

## 2017-02-19 DIAGNOSIS — L97421 Non-pressure chronic ulcer of left heel and midfoot limited to breakdown of skin: Secondary | ICD-10-CM | POA: Diagnosis not present

## 2017-02-19 DIAGNOSIS — E1142 Type 2 diabetes mellitus with diabetic polyneuropathy: Secondary | ICD-10-CM | POA: Diagnosis not present

## 2017-02-19 DIAGNOSIS — I10 Essential (primary) hypertension: Secondary | ICD-10-CM | POA: Diagnosis not present

## 2017-02-19 DIAGNOSIS — E11621 Type 2 diabetes mellitus with foot ulcer: Secondary | ICD-10-CM | POA: Diagnosis not present

## 2017-02-19 DIAGNOSIS — K5790 Diverticulosis of intestine, part unspecified, without perforation or abscess without bleeding: Secondary | ICD-10-CM | POA: Diagnosis not present

## 2017-02-19 DIAGNOSIS — M05172 Rheumatoid lung disease with rheumatoid arthritis of left ankle and foot: Secondary | ICD-10-CM | POA: Diagnosis not present

## 2017-02-19 NOTE — Telephone Encounter (Addendum)
I spoke with  Joie Bimler and they had spoken to pt on Friday and pt stated she would like [facility] and they don't do [facility]. Referral to [facility] faxed.

## 2017-02-19 NOTE — Progress Notes (Signed)
Subjective: 83 y.o. returns the office today for painful, elongated, thickened toenails which she cannot trim herself. She also states that she has calluses to her feet. She has been continuing to the wound care center for the wound on her left foot which she feels is healed. She is not noticed any drainage or swelling or any redness to her feet. She states that she does feel unbalance and she has been off her feet for some elongated and requesting physical therapy. Her son did purchase a wheelchair in order for herself or pedis pulses possible when out of the wound is healed she has been more active and secured a fall. Denies any acute changes since last appointment and no new complaints today. Denies any systemic complaints such as fevers, chills, nausea, vomiting.   Objective: AAO 3, NAD DP/PT pulses palpable, CRT less than 3 seconds Nails hypertrophic, dystrophic, elongated, brittle, discolored 9. There is tenderness overlying the nails 1-5 bilaterally except left 2nd. There is no surrounding erythema or drainage along the nail sites. Hyperkeratotic lesion left foot submetatarsal 1. Upon debridement there is no underlying ulceration, drainage or any clinical signs of infection. Hyperkeratotic lesion right second dorsal PIPJ and right sub 5 space. Upon debridement no underlying ulceration, drainage or any clinical signs of infection to the hyperkeratotic lesions. No open lesions or pre-ulcerative lesions are identified. No other areas of tenderness bilateral lower extremities. No overlying edema, erythema, increased warmth. No pain with calf compression, swelling, warmth, erythema.  Assessment: Patient presents with symptomatic onychomycosis; pre-ulcerative calluses 3 with resolved ulceration.   Plan: -Treatment options including alternatives, risks, complications were discussed -Nails sharply debrided 9 without complication/bleeding.  -Hyperkeratotic lesions were debrided 3 without  complications or bleeding however they are pre-ulcerative. -Measured for new accommodate inserts.  -Recommended home physical therapy for safety evaluation as well as gait training. -Discussed daily foot inspection. If there are any changes, to call the office immediately.  -Follow-up as scheduled or sooner if any problems are to arise. In the meantime, encouraged to call the office with any questions, concerns, changes symptoms.  Celesta Gentile, DPM

## 2017-02-19 NOTE — Addendum Note (Signed)
Addended by: Harriett Sine D on: 02/19/2017 11:25 AM   Modules accepted: Orders

## 2017-02-19 NOTE — Telephone Encounter (Signed)
done

## 2017-02-21 DIAGNOSIS — L84 Corns and callosities: Secondary | ICD-10-CM | POA: Diagnosis not present

## 2017-02-21 DIAGNOSIS — E1142 Type 2 diabetes mellitus with diabetic polyneuropathy: Secondary | ICD-10-CM | POA: Diagnosis not present

## 2017-02-21 DIAGNOSIS — I10 Essential (primary) hypertension: Secondary | ICD-10-CM | POA: Diagnosis not present

## 2017-02-21 DIAGNOSIS — R2689 Other abnormalities of gait and mobility: Secondary | ICD-10-CM | POA: Diagnosis not present

## 2017-02-21 DIAGNOSIS — B351 Tinea unguium: Secondary | ICD-10-CM | POA: Diagnosis not present

## 2017-02-21 DIAGNOSIS — M79676 Pain in unspecified toe(s): Secondary | ICD-10-CM | POA: Diagnosis not present

## 2017-02-25 DIAGNOSIS — E1142 Type 2 diabetes mellitus with diabetic polyneuropathy: Secondary | ICD-10-CM | POA: Diagnosis not present

## 2017-02-25 DIAGNOSIS — R2689 Other abnormalities of gait and mobility: Secondary | ICD-10-CM | POA: Diagnosis not present

## 2017-02-25 DIAGNOSIS — B351 Tinea unguium: Secondary | ICD-10-CM | POA: Diagnosis not present

## 2017-02-25 DIAGNOSIS — M79676 Pain in unspecified toe(s): Secondary | ICD-10-CM | POA: Diagnosis not present

## 2017-02-25 DIAGNOSIS — L84 Corns and callosities: Secondary | ICD-10-CM | POA: Diagnosis not present

## 2017-02-25 DIAGNOSIS — I10 Essential (primary) hypertension: Secondary | ICD-10-CM | POA: Diagnosis not present

## 2017-02-28 ENCOUNTER — Telehealth: Payer: Self-pay | Admitting: Podiatry

## 2017-02-28 DIAGNOSIS — M79676 Pain in unspecified toe(s): Secondary | ICD-10-CM | POA: Diagnosis not present

## 2017-02-28 DIAGNOSIS — L84 Corns and callosities: Secondary | ICD-10-CM | POA: Diagnosis not present

## 2017-02-28 DIAGNOSIS — R2689 Other abnormalities of gait and mobility: Secondary | ICD-10-CM | POA: Diagnosis not present

## 2017-02-28 DIAGNOSIS — B351 Tinea unguium: Secondary | ICD-10-CM | POA: Diagnosis not present

## 2017-02-28 DIAGNOSIS — E1142 Type 2 diabetes mellitus with diabetic polyneuropathy: Secondary | ICD-10-CM | POA: Diagnosis not present

## 2017-02-28 DIAGNOSIS — I10 Essential (primary) hypertension: Secondary | ICD-10-CM | POA: Diagnosis not present

## 2017-02-28 NOTE — Telephone Encounter (Signed)
[name], can you please check on this for her? Thanks.

## 2017-02-28 NOTE — Telephone Encounter (Signed)
Pt called checking status of diabetic shoes and inserts. She said she has been waiting for a while. Released from wound care. Pt states urgernt. Its holding up her therapy.

## 2017-03-04 DIAGNOSIS — E1142 Type 2 diabetes mellitus with diabetic polyneuropathy: Secondary | ICD-10-CM | POA: Diagnosis not present

## 2017-03-04 DIAGNOSIS — B351 Tinea unguium: Secondary | ICD-10-CM | POA: Diagnosis not present

## 2017-03-04 DIAGNOSIS — M79676 Pain in unspecified toe(s): Secondary | ICD-10-CM | POA: Diagnosis not present

## 2017-03-04 DIAGNOSIS — R2689 Other abnormalities of gait and mobility: Secondary | ICD-10-CM | POA: Diagnosis not present

## 2017-03-04 DIAGNOSIS — I10 Essential (primary) hypertension: Secondary | ICD-10-CM | POA: Diagnosis not present

## 2017-03-04 DIAGNOSIS — L84 Corns and callosities: Secondary | ICD-10-CM | POA: Diagnosis not present

## 2017-03-06 ENCOUNTER — Telehealth: Payer: Self-pay | Admitting: Podiatry

## 2017-03-06 DIAGNOSIS — M1711 Unilateral primary osteoarthritis, right knee: Secondary | ICD-10-CM | POA: Diagnosis not present

## 2017-03-06 DIAGNOSIS — M069 Rheumatoid arthritis, unspecified: Secondary | ICD-10-CM | POA: Diagnosis not present

## 2017-03-06 DIAGNOSIS — M25561 Pain in right knee: Secondary | ICD-10-CM | POA: Diagnosis not present

## 2017-03-06 DIAGNOSIS — G8929 Other chronic pain: Secondary | ICD-10-CM | POA: Diagnosis not present

## 2017-03-06 NOTE — Telephone Encounter (Signed)
Pt called asking if we got the diabetic shoe paperwork from Dr Shelia Media yet. Please let her know if we did not have it and she will call Dr Pennie Banter office again.

## 2017-03-06 NOTE — Telephone Encounter (Signed)
Rick  Or Lattie Haw- can you please check on this for her? Thank you.

## 2017-03-07 ENCOUNTER — Telehealth: Payer: Self-pay | Admitting: Podiatry

## 2017-03-07 NOTE — Telephone Encounter (Signed)
Spoke to patient and per Liliane Channel when he looked at [facility] office did not completely fill paperwork out. It was missing the dos last seen at pcp office. I told pt and she is calling Dr Pennie Banter office to have them fix it an send it back.

## 2017-03-07 NOTE — Telephone Encounter (Signed)
Pt called again to see if we have gotten the authorization from Dr Shelia Media for her diabetic shoes. If not she will call there office. Please advise asap. Pt is leaving home today at 1230pm

## 2017-03-11 DIAGNOSIS — I10 Essential (primary) hypertension: Secondary | ICD-10-CM | POA: Diagnosis not present

## 2017-03-11 DIAGNOSIS — B351 Tinea unguium: Secondary | ICD-10-CM | POA: Diagnosis not present

## 2017-03-11 DIAGNOSIS — E1142 Type 2 diabetes mellitus with diabetic polyneuropathy: Secondary | ICD-10-CM | POA: Diagnosis not present

## 2017-03-11 DIAGNOSIS — L84 Corns and callosities: Secondary | ICD-10-CM | POA: Diagnosis not present

## 2017-03-11 DIAGNOSIS — R2689 Other abnormalities of gait and mobility: Secondary | ICD-10-CM | POA: Diagnosis not present

## 2017-03-11 DIAGNOSIS — M79676 Pain in unspecified toe(s): Secondary | ICD-10-CM | POA: Diagnosis not present

## 2017-03-11 NOTE — Telephone Encounter (Signed)
Thanks [employer]...you are the best..Marland Kitchen

## 2017-03-13 DIAGNOSIS — M79651 Pain in right thigh: Secondary | ICD-10-CM | POA: Diagnosis not present

## 2017-03-13 DIAGNOSIS — M545 Low back pain: Secondary | ICD-10-CM | POA: Diagnosis not present

## 2017-03-13 DIAGNOSIS — M25551 Pain in right hip: Secondary | ICD-10-CM | POA: Diagnosis not present

## 2017-03-14 ENCOUNTER — Ambulatory Visit: Payer: Medicare Other | Admitting: Podiatry

## 2017-03-15 NOTE — Telephone Encounter (Signed)
Tried to call the patient at 9:30 am today and the phone line was busy and I am going to try again today. [name]

## 2017-03-15 NOTE — Telephone Encounter (Signed)
Patient has an appointment on Monday and was approved for diabetic shoes from Dr Shelia Media on 02-28-17. Lattie Haw

## 2017-03-18 ENCOUNTER — Ambulatory Visit (INDEPENDENT_AMBULATORY_CARE_PROVIDER_SITE_OTHER): Payer: Medicare Other | Admitting: Podiatry

## 2017-03-18 ENCOUNTER — Encounter: Payer: Self-pay | Admitting: Podiatry

## 2017-03-18 DIAGNOSIS — L84 Corns and callosities: Secondary | ICD-10-CM

## 2017-03-18 DIAGNOSIS — E1140 Type 2 diabetes mellitus with diabetic neuropathy, unspecified: Secondary | ICD-10-CM

## 2017-03-18 DIAGNOSIS — B351 Tinea unguium: Secondary | ICD-10-CM

## 2017-03-18 DIAGNOSIS — M79676 Pain in unspecified toe(s): Secondary | ICD-10-CM | POA: Diagnosis not present

## 2017-03-19 NOTE — Progress Notes (Signed)
Subjective: 83 y.o. returns the office today for painful, elongated, thickened toenails which she cannot trim herself and for pre-ulcerative calluses. She denies any open sores to her feet and she denies any swelling or redness to her feet. She does walk with a walker today. She has no new concerns. She is awaiting diabetic shoes. Denies any acute changes since last appointment and no new complaints today. Denies any systemic complaints such as fevers, chills, nausea, vomiting.   Objective: AAO 3, NAD DP/PT pulses palpable, CRT less than 3 seconds Nails hypertrophic, dystrophic, elongated, brittle, discolored 9. There is tenderness overlying the nails 1-5 bilaterally except left 2nd. There is no surrounding erythema or drainage along the nail sites. Hyperkeratotic lesion left foot submetatarsal 1 and right plantar 5th metatarsal base. Upon debridement there is no underlying ulceration, drainage or any clinical signs of infection. No open lesions or pre-ulcerative lesions are identified. No other areas of tenderness bilateral lower extremities. No overlying edema, erythema, increased warmth. No pain with calf compression, swelling, warmth, erythema.  Assessment: Patient presents with symptomatic onychomycosis; pre-ulcerative calluses 3 with resolved ulceration.   Plan: -Treatment options including alternatives, risks, complications were discussed -Nails sharply debrided 9 without complication/bleeding.  -Hyperkeratotic lesions were debrided 2 without complications or bleeding however they are pre-ulcerative. Liliane Channel talked with her today in regards to the status of her shoes/inserts.  -Discussed daily foot inspection. If there are any changes, to call the office immediately.  -Follow-up as scheduled or sooner if any problems are to arise. In the meantime, encouraged to call the office with any questions, concerns, changes symptoms.  Celesta Gentile, DPM

## 2017-03-20 DIAGNOSIS — B351 Tinea unguium: Secondary | ICD-10-CM | POA: Diagnosis not present

## 2017-03-20 DIAGNOSIS — E1142 Type 2 diabetes mellitus with diabetic polyneuropathy: Secondary | ICD-10-CM | POA: Diagnosis not present

## 2017-03-20 DIAGNOSIS — M79676 Pain in unspecified toe(s): Secondary | ICD-10-CM | POA: Diagnosis not present

## 2017-03-20 DIAGNOSIS — I10 Essential (primary) hypertension: Secondary | ICD-10-CM | POA: Diagnosis not present

## 2017-03-20 DIAGNOSIS — L84 Corns and callosities: Secondary | ICD-10-CM | POA: Diagnosis not present

## 2017-03-20 DIAGNOSIS — R2689 Other abnormalities of gait and mobility: Secondary | ICD-10-CM | POA: Diagnosis not present

## 2017-03-20 NOTE — Telephone Encounter (Signed)
done

## 2017-03-20 NOTE — Telephone Encounter (Signed)
Spoke to her...gave her paperwork to give to [facility].Marland KitchenMarland KitchenMarland KitchenThe paperwork he faxed to [facility] was incomplete.I gave her paperwork to take to dr. Shelia Media office and indicated what correctly needed to be completed.Marland KitchenMarland Kitchen

## 2017-03-21 ENCOUNTER — Telehealth: Payer: Self-pay | Admitting: Podiatry

## 2017-03-21 NOTE — Telephone Encounter (Signed)
Hello, this is Mickel Baas with [facility]. I'm calling in regards to [facility]. I am requesting orders to continue her physical therapy for two times a week then off for a week while her family takes her on vacation then again for two times a week for three weeks. If you would, please call me at [phone]. Thank you.

## 2017-03-21 NOTE — Telephone Encounter (Signed)
Dr. Berton Lan continuation of PT as recommended for pt.

## 2017-03-22 DIAGNOSIS — I10 Essential (primary) hypertension: Secondary | ICD-10-CM | POA: Diagnosis not present

## 2017-03-22 DIAGNOSIS — E1142 Type 2 diabetes mellitus with diabetic polyneuropathy: Secondary | ICD-10-CM | POA: Diagnosis not present

## 2017-03-22 DIAGNOSIS — B351 Tinea unguium: Secondary | ICD-10-CM | POA: Diagnosis not present

## 2017-03-22 DIAGNOSIS — R2689 Other abnormalities of gait and mobility: Secondary | ICD-10-CM | POA: Diagnosis not present

## 2017-03-22 DIAGNOSIS — M79676 Pain in unspecified toe(s): Secondary | ICD-10-CM | POA: Diagnosis not present

## 2017-03-22 DIAGNOSIS — L84 Corns and callosities: Secondary | ICD-10-CM | POA: Diagnosis not present

## 2017-03-25 DIAGNOSIS — M79676 Pain in unspecified toe(s): Secondary | ICD-10-CM | POA: Diagnosis not present

## 2017-03-25 DIAGNOSIS — L84 Corns and callosities: Secondary | ICD-10-CM | POA: Diagnosis not present

## 2017-03-25 DIAGNOSIS — B351 Tinea unguium: Secondary | ICD-10-CM | POA: Diagnosis not present

## 2017-03-25 DIAGNOSIS — E1142 Type 2 diabetes mellitus with diabetic polyneuropathy: Secondary | ICD-10-CM | POA: Diagnosis not present

## 2017-03-25 DIAGNOSIS — R2689 Other abnormalities of gait and mobility: Secondary | ICD-10-CM | POA: Diagnosis not present

## 2017-03-25 DIAGNOSIS — I10 Essential (primary) hypertension: Secondary | ICD-10-CM | POA: Diagnosis not present

## 2017-03-28 DIAGNOSIS — M79676 Pain in unspecified toe(s): Secondary | ICD-10-CM | POA: Diagnosis not present

## 2017-03-28 DIAGNOSIS — R2689 Other abnormalities of gait and mobility: Secondary | ICD-10-CM | POA: Diagnosis not present

## 2017-03-28 DIAGNOSIS — E1142 Type 2 diabetes mellitus with diabetic polyneuropathy: Secondary | ICD-10-CM | POA: Diagnosis not present

## 2017-03-28 DIAGNOSIS — B351 Tinea unguium: Secondary | ICD-10-CM | POA: Diagnosis not present

## 2017-03-28 DIAGNOSIS — I10 Essential (primary) hypertension: Secondary | ICD-10-CM | POA: Diagnosis not present

## 2017-03-28 DIAGNOSIS — L84 Corns and callosities: Secondary | ICD-10-CM | POA: Diagnosis not present

## 2017-04-08 DIAGNOSIS — R300 Dysuria: Secondary | ICD-10-CM | POA: Diagnosis not present

## 2017-04-09 ENCOUNTER — Ambulatory Visit (INDEPENDENT_AMBULATORY_CARE_PROVIDER_SITE_OTHER): Payer: Medicare Other | Admitting: Orthotics

## 2017-04-09 DIAGNOSIS — L84 Corns and callosities: Secondary | ICD-10-CM

## 2017-04-09 DIAGNOSIS — L97522 Non-pressure chronic ulcer of other part of left foot with fat layer exposed: Secondary | ICD-10-CM

## 2017-04-09 DIAGNOSIS — E11621 Type 2 diabetes mellitus with foot ulcer: Secondary | ICD-10-CM

## 2017-04-09 DIAGNOSIS — E1140 Type 2 diabetes mellitus with diabetic neuropathy, unspecified: Secondary | ICD-10-CM

## 2017-04-09 NOTE — Progress Notes (Signed)

## 2017-04-10 DIAGNOSIS — B351 Tinea unguium: Secondary | ICD-10-CM | POA: Diagnosis not present

## 2017-04-10 DIAGNOSIS — I10 Essential (primary) hypertension: Secondary | ICD-10-CM | POA: Diagnosis not present

## 2017-04-10 DIAGNOSIS — R2689 Other abnormalities of gait and mobility: Secondary | ICD-10-CM | POA: Diagnosis not present

## 2017-04-10 DIAGNOSIS — E1142 Type 2 diabetes mellitus with diabetic polyneuropathy: Secondary | ICD-10-CM | POA: Diagnosis not present

## 2017-04-10 DIAGNOSIS — M79676 Pain in unspecified toe(s): Secondary | ICD-10-CM | POA: Diagnosis not present

## 2017-04-10 DIAGNOSIS — L84 Corns and callosities: Secondary | ICD-10-CM | POA: Diagnosis not present

## 2017-04-11 DIAGNOSIS — M810 Age-related osteoporosis without current pathological fracture: Secondary | ICD-10-CM | POA: Diagnosis not present

## 2017-04-11 DIAGNOSIS — M79643 Pain in unspecified hand: Secondary | ICD-10-CM | POA: Diagnosis not present

## 2017-04-11 DIAGNOSIS — M0579 Rheumatoid arthritis with rheumatoid factor of multiple sites without organ or systems involvement: Secondary | ICD-10-CM | POA: Diagnosis not present

## 2017-04-11 DIAGNOSIS — Z79899 Other long term (current) drug therapy: Secondary | ICD-10-CM | POA: Diagnosis not present

## 2017-04-12 DIAGNOSIS — R2689 Other abnormalities of gait and mobility: Secondary | ICD-10-CM | POA: Diagnosis not present

## 2017-04-12 DIAGNOSIS — L84 Corns and callosities: Secondary | ICD-10-CM | POA: Diagnosis not present

## 2017-04-12 DIAGNOSIS — I10 Essential (primary) hypertension: Secondary | ICD-10-CM | POA: Diagnosis not present

## 2017-04-12 DIAGNOSIS — E1142 Type 2 diabetes mellitus with diabetic polyneuropathy: Secondary | ICD-10-CM | POA: Diagnosis not present

## 2017-04-12 DIAGNOSIS — B351 Tinea unguium: Secondary | ICD-10-CM | POA: Diagnosis not present

## 2017-04-12 DIAGNOSIS — M79676 Pain in unspecified toe(s): Secondary | ICD-10-CM | POA: Diagnosis not present

## 2017-04-15 DIAGNOSIS — E11621 Type 2 diabetes mellitus with foot ulcer: Secondary | ICD-10-CM | POA: Diagnosis not present

## 2017-04-15 DIAGNOSIS — E119 Type 2 diabetes mellitus without complications: Secondary | ICD-10-CM | POA: Diagnosis not present

## 2017-04-15 DIAGNOSIS — E7800 Pure hypercholesterolemia, unspecified: Secondary | ICD-10-CM | POA: Diagnosis not present

## 2017-04-17 DIAGNOSIS — I10 Essential (primary) hypertension: Secondary | ICD-10-CM | POA: Diagnosis not present

## 2017-04-17 DIAGNOSIS — M79676 Pain in unspecified toe(s): Secondary | ICD-10-CM | POA: Diagnosis not present

## 2017-04-17 DIAGNOSIS — E1142 Type 2 diabetes mellitus with diabetic polyneuropathy: Secondary | ICD-10-CM | POA: Diagnosis not present

## 2017-04-17 DIAGNOSIS — L84 Corns and callosities: Secondary | ICD-10-CM | POA: Diagnosis not present

## 2017-04-17 DIAGNOSIS — R2689 Other abnormalities of gait and mobility: Secondary | ICD-10-CM | POA: Diagnosis not present

## 2017-04-17 DIAGNOSIS — B351 Tinea unguium: Secondary | ICD-10-CM | POA: Diagnosis not present

## 2017-04-19 ENCOUNTER — Telehealth: Payer: Self-pay | Admitting: Podiatry

## 2017-04-19 DIAGNOSIS — E1142 Type 2 diabetes mellitus with diabetic polyneuropathy: Secondary | ICD-10-CM | POA: Diagnosis not present

## 2017-04-19 DIAGNOSIS — M79676 Pain in unspecified toe(s): Secondary | ICD-10-CM | POA: Diagnosis not present

## 2017-04-19 DIAGNOSIS — L84 Corns and callosities: Secondary | ICD-10-CM | POA: Diagnosis not present

## 2017-04-19 DIAGNOSIS — R2689 Other abnormalities of gait and mobility: Secondary | ICD-10-CM | POA: Diagnosis not present

## 2017-04-19 DIAGNOSIS — B351 Tinea unguium: Secondary | ICD-10-CM | POA: Diagnosis not present

## 2017-04-19 DIAGNOSIS — I10 Essential (primary) hypertension: Secondary | ICD-10-CM | POA: Diagnosis not present

## 2017-04-19 NOTE — Telephone Encounter (Signed)
This is [name] with [facility]. We are asking for continued orders for physical therapy for twice a week for six weeks. We are going to work with her on a balance program, working on her ankle range of motion, general balance, and gait safety. My number is [phone] and if that is okay you can leave a verbal order on my voicemail. Thank you very much.

## 2017-04-19 NOTE — Telephone Encounter (Signed)
Dr. Berton Lan continued PT as recommended by Thomos Lemons. Orders called to Mickel Baas.

## 2017-04-22 DIAGNOSIS — I10 Essential (primary) hypertension: Secondary | ICD-10-CM | POA: Diagnosis not present

## 2017-04-22 DIAGNOSIS — L84 Corns and callosities: Secondary | ICD-10-CM | POA: Diagnosis not present

## 2017-04-22 DIAGNOSIS — R269 Unspecified abnormalities of gait and mobility: Secondary | ICD-10-CM | POA: Diagnosis not present

## 2017-04-22 DIAGNOSIS — Z7984 Long term (current) use of oral hypoglycemic drugs: Secondary | ICD-10-CM | POA: Diagnosis not present

## 2017-04-22 DIAGNOSIS — E1142 Type 2 diabetes mellitus with diabetic polyneuropathy: Secondary | ICD-10-CM | POA: Diagnosis not present

## 2017-04-23 ENCOUNTER — Telehealth: Payer: Self-pay | Admitting: *Deleted

## 2017-04-23 NOTE — Telephone Encounter (Signed)
Received [facility] orders for signing stating, "Stop taking Hydroxycholoq and start taking Leflunomide 10 mg daily." Dr. Jacqualyn Posey states he did not sign these orders, because he did not order.

## 2017-04-24 DIAGNOSIS — I10 Essential (primary) hypertension: Secondary | ICD-10-CM | POA: Diagnosis not present

## 2017-04-24 DIAGNOSIS — E1142 Type 2 diabetes mellitus with diabetic polyneuropathy: Secondary | ICD-10-CM | POA: Diagnosis not present

## 2017-04-24 DIAGNOSIS — L84 Corns and callosities: Secondary | ICD-10-CM | POA: Diagnosis not present

## 2017-04-24 DIAGNOSIS — M4150 Other secondary scoliosis, site unspecified: Secondary | ICD-10-CM | POA: Diagnosis not present

## 2017-04-24 DIAGNOSIS — M25552 Pain in left hip: Secondary | ICD-10-CM | POA: Diagnosis not present

## 2017-04-24 DIAGNOSIS — Z7984 Long term (current) use of oral hypoglycemic drugs: Secondary | ICD-10-CM | POA: Diagnosis not present

## 2017-04-24 DIAGNOSIS — R269 Unspecified abnormalities of gait and mobility: Secondary | ICD-10-CM | POA: Diagnosis not present

## 2017-04-25 DIAGNOSIS — N390 Urinary tract infection, site not specified: Secondary | ICD-10-CM | POA: Diagnosis not present

## 2017-04-25 DIAGNOSIS — R300 Dysuria: Secondary | ICD-10-CM | POA: Diagnosis not present

## 2017-04-29 ENCOUNTER — Ambulatory Visit: Payer: Medicare Other | Admitting: Podiatry

## 2017-04-29 ENCOUNTER — Telehealth: Payer: Self-pay | Admitting: Podiatry

## 2017-04-29 NOTE — Telephone Encounter (Signed)
Hi, this is Donalynn Furlong with [facility]. If Dr. Leigh Aurora nurse could call me back at [phone]. I have a question in regards to [facility]. Thank you.

## 2017-05-01 ENCOUNTER — Telehealth: Payer: Self-pay | Admitting: Podiatry

## 2017-05-01 NOTE — Telephone Encounter (Signed)
Pt called requesting Dr. Jacqualyn Posey get her set up for therapy again for her wound. She stated she is unable to walk and thinks the wound might be growing back. States she is unable to come to her appointments because she does not have transportation.

## 2017-05-01 NOTE — Telephone Encounter (Signed)
Left message for Donalynn Furlong - [facility], stating if she would leave a message with her concern, I would be able to ask Dr. Jacqualyn Posey and return her call with an answer. I also informed Mickel Baas, Dr. Jacqualyn Posey did want to continue the gait and balance training, fall prevention and strength training for pt and would accept [facility] recommendations for frequency.

## 2017-05-09 ENCOUNTER — Telehealth: Payer: Self-pay

## 2017-05-09 ENCOUNTER — Encounter: Payer: Self-pay | Admitting: Podiatry

## 2017-05-09 ENCOUNTER — Ambulatory Visit (INDEPENDENT_AMBULATORY_CARE_PROVIDER_SITE_OTHER): Payer: Medicare Other | Admitting: Podiatry

## 2017-05-09 DIAGNOSIS — E1140 Type 2 diabetes mellitus with diabetic neuropathy, unspecified: Secondary | ICD-10-CM | POA: Diagnosis not present

## 2017-05-09 DIAGNOSIS — B351 Tinea unguium: Secondary | ICD-10-CM

## 2017-05-09 DIAGNOSIS — L84 Corns and callosities: Secondary | ICD-10-CM

## 2017-05-09 DIAGNOSIS — M79676 Pain in unspecified toe(s): Secondary | ICD-10-CM

## 2017-05-09 NOTE — Telephone Encounter (Signed)
Left message with nurse to inform Dr Shelia Media of patient's blood glucose reading of 344 this morning

## 2017-05-10 NOTE — Progress Notes (Signed)
Subjective: 83 y.o. returns the office today for painful, elongated, thickened toenails which she cannot trim herself and for pre-ulcerative calluses. She denies any open sores to her feet and she denies any swelling or redness to her feet. She has no new concerns. She is awaiting diabetic shoes. Denies any acute changes since last appointment and no new complaints today. Denies any systemic complaints such as fevers, chills, nausea, vomiting.   Objective: AAO 3, NAD DP/PT pulses palpable, CRT less than 3 seconds Nails hypertrophic, dystrophic, elongated, brittle, discolored 9. There is tenderness overlying the nails 1-5 bilaterally except left 2nd. There is no surrounding erythema or drainage along the nail sites. Hyperkeratotic lesion left foot submetatarsal 1 and right plantar 5th metatarsal base. Upon debridement there is no underlying ulceration, drainage or any clinical signs of infection. No open lesions or pre-ulcerative lesions are identified. No other areas of tenderness bilateral lower extremities. No overlying edema, erythema, increased warmth. No pain with calf compression, swelling, warmth, erythema.  Assessment: Patient presents with symptomatic onychomycosis; pre-ulcerative calluses 3 with resolved ulceration.   Plan: -Treatment options including alternatives, risks, complications were discussed -Nails sharply debrided 9 without complication/bleeding.  -Hyperkeratotic lesions were debrided 2 without complications or bleeding however they are pre-ulcerative. -Today she was not feeling that well. Her blood sugar was 344. She has an aid with her that stays with her as well. We called Dr. Pennie Banter office and left a message. Her aid assured that when she gets home she is going to take her medicine and recheck her blood sugar. If not improved recommended her to call the PCP and she understood.  -Discussed daily foot inspection. If there are any changes, to call the office  immediately.  -Follow-up as scheduled or sooner if any problems are to arise. In the meantime, encouraged to call the office with any questions, concerns, changes symptoms.  Celesta Gentile, DPM

## 2017-05-16 DIAGNOSIS — E860 Dehydration: Secondary | ICD-10-CM | POA: Diagnosis not present

## 2017-05-16 DIAGNOSIS — L89302 Pressure ulcer of unspecified buttock, stage 2: Secondary | ICD-10-CM | POA: Diagnosis not present

## 2017-05-16 DIAGNOSIS — E119 Type 2 diabetes mellitus without complications: Secondary | ICD-10-CM | POA: Diagnosis not present

## 2017-05-16 DIAGNOSIS — R29898 Other symptoms and signs involving the musculoskeletal system: Secondary | ICD-10-CM | POA: Diagnosis not present

## 2017-05-16 DIAGNOSIS — D72829 Elevated white blood cell count, unspecified: Secondary | ICD-10-CM | POA: Diagnosis not present

## 2017-05-20 DIAGNOSIS — Z79899 Other long term (current) drug therapy: Secondary | ICD-10-CM | POA: Diagnosis not present

## 2017-05-20 DIAGNOSIS — M79643 Pain in unspecified hand: Secondary | ICD-10-CM | POA: Diagnosis not present

## 2017-05-20 DIAGNOSIS — M0579 Rheumatoid arthritis with rheumatoid factor of multiple sites without organ or systems involvement: Secondary | ICD-10-CM | POA: Diagnosis not present

## 2017-05-20 DIAGNOSIS — M810 Age-related osteoporosis without current pathological fracture: Secondary | ICD-10-CM | POA: Diagnosis not present

## 2017-05-21 DIAGNOSIS — E119 Type 2 diabetes mellitus without complications: Secondary | ICD-10-CM | POA: Diagnosis not present

## 2017-05-21 DIAGNOSIS — K219 Gastro-esophageal reflux disease without esophagitis: Secondary | ICD-10-CM | POA: Diagnosis not present

## 2017-05-21 DIAGNOSIS — M810 Age-related osteoporosis without current pathological fracture: Secondary | ICD-10-CM | POA: Diagnosis not present

## 2017-06-05 DIAGNOSIS — F329 Major depressive disorder, single episode, unspecified: Secondary | ICD-10-CM | POA: Diagnosis not present

## 2017-06-05 DIAGNOSIS — E119 Type 2 diabetes mellitus without complications: Secondary | ICD-10-CM | POA: Diagnosis not present

## 2017-06-05 DIAGNOSIS — I10 Essential (primary) hypertension: Secondary | ICD-10-CM | POA: Diagnosis not present

## 2017-06-05 DIAGNOSIS — M1991 Primary osteoarthritis, unspecified site: Secondary | ICD-10-CM | POA: Diagnosis not present

## 2017-06-05 DIAGNOSIS — Z7984 Long term (current) use of oral hypoglycemic drugs: Secondary | ICD-10-CM | POA: Diagnosis not present

## 2017-06-05 DIAGNOSIS — F419 Anxiety disorder, unspecified: Secondary | ICD-10-CM | POA: Diagnosis not present

## 2017-06-05 DIAGNOSIS — M4800 Spinal stenosis, site unspecified: Secondary | ICD-10-CM | POA: Diagnosis not present

## 2017-06-05 DIAGNOSIS — M6281 Muscle weakness (generalized): Secondary | ICD-10-CM | POA: Diagnosis not present

## 2017-06-05 DIAGNOSIS — M810 Age-related osteoporosis without current pathological fracture: Secondary | ICD-10-CM | POA: Diagnosis not present

## 2017-06-05 DIAGNOSIS — M069 Rheumatoid arthritis, unspecified: Secondary | ICD-10-CM | POA: Diagnosis not present

## 2017-06-06 ENCOUNTER — Encounter (HOSPITAL_BASED_OUTPATIENT_CLINIC_OR_DEPARTMENT_OTHER): Payer: Medicare Other

## 2017-06-06 DIAGNOSIS — H9191 Unspecified hearing loss, right ear: Secondary | ICD-10-CM | POA: Diagnosis not present

## 2017-06-06 DIAGNOSIS — M8580 Other specified disorders of bone density and structure, unspecified site: Secondary | ICD-10-CM | POA: Diagnosis not present

## 2017-06-06 DIAGNOSIS — M859 Disorder of bone density and structure, unspecified: Secondary | ICD-10-CM | POA: Diagnosis not present

## 2017-06-06 DIAGNOSIS — E1149 Type 2 diabetes mellitus with other diabetic neurological complication: Secondary | ICD-10-CM | POA: Diagnosis not present

## 2017-06-07 ENCOUNTER — Emergency Department (HOSPITAL_COMMUNITY)
Admission: EM | Admit: 2017-06-07 | Discharge: 2017-06-08 | Disposition: A | Discharge status: Home / Self Care | Payer: Medicare Other | Attending: Emergency Medicine | Admitting: Emergency Medicine

## 2017-06-07 DIAGNOSIS — E039 Hypothyroidism, unspecified: Secondary | ICD-10-CM | POA: Diagnosis not present

## 2017-06-07 DIAGNOSIS — Z79899 Other long term (current) drug therapy: Secondary | ICD-10-CM | POA: Diagnosis not present

## 2017-06-07 DIAGNOSIS — I4891 Unspecified atrial fibrillation: Secondary | ICD-10-CM | POA: Diagnosis not present

## 2017-06-07 DIAGNOSIS — I10 Essential (primary) hypertension: Secondary | ICD-10-CM | POA: Insufficient documentation

## 2017-06-07 DIAGNOSIS — E119 Type 2 diabetes mellitus without complications: Secondary | ICD-10-CM | POA: Insufficient documentation

## 2017-06-07 DIAGNOSIS — R112 Nausea with vomiting, unspecified: Secondary | ICD-10-CM | POA: Diagnosis not present

## 2017-06-07 DIAGNOSIS — R8299 Other abnormal findings in urine: Secondary | ICD-10-CM | POA: Diagnosis not present

## 2017-06-07 DIAGNOSIS — R42 Dizziness and giddiness: Secondary | ICD-10-CM | POA: Insufficient documentation

## 2017-06-07 DIAGNOSIS — Z7984 Long term (current) use of oral hypoglycemic drugs: Secondary | ICD-10-CM | POA: Diagnosis not present

## 2017-06-07 DIAGNOSIS — R000 Tachycardia, unspecified: Secondary | ICD-10-CM | POA: Diagnosis not present

## 2017-06-08 ENCOUNTER — Emergency Department (HOSPITAL_COMMUNITY): Payer: Medicare Other

## 2017-06-08 ENCOUNTER — Encounter (HOSPITAL_COMMUNITY): Payer: Self-pay | Admitting: Emergency Medicine

## 2017-06-08 DIAGNOSIS — R42 Dizziness and giddiness: Secondary | ICD-10-CM | POA: Diagnosis not present

## 2017-06-08 DIAGNOSIS — R110 Nausea: Secondary | ICD-10-CM | POA: Diagnosis not present

## 2017-06-08 DIAGNOSIS — I4891 Unspecified atrial fibrillation: Secondary | ICD-10-CM | POA: Diagnosis not present

## 2017-06-08 LAB — URINALYSIS, ROUTINE W REFLEX MICROSCOPIC
BACTERIA UA: NONE SEEN
Bilirubin Urine: NEGATIVE
Glucose, UA: 50 mg/dL — AB
Ketones, ur: 5 mg/dL — AB
Nitrite: NEGATIVE
PROTEIN: 30 mg/dL — AB
Specific Gravity, Urine: 1.017 (ref 1.005–1.030)
pH: 5 (ref 5.0–8.0)

## 2017-06-08 LAB — CBC
HCT: 36.1 % (ref 36.0–46.0)
Hemoglobin: 11.6 g/dL — ABNORMAL LOW (ref 12.0–15.0)
MCH: 28.9 pg (ref 26.0–34.0)
MCHC: 32.1 g/dL (ref 30.0–36.0)
MCV: 90 fL (ref 78.0–100.0)
PLATELETS: 272 10*3/uL (ref 150–400)
RBC: 4.01 MIL/uL (ref 3.87–5.11)
RDW: 15.5 % (ref 11.5–15.5)
WBC: 6.2 10*3/uL (ref 4.0–10.5)

## 2017-06-08 LAB — BASIC METABOLIC PANEL
Anion gap: 12 (ref 5–15)
BUN: 11 mg/dL (ref 6–20)
CHLORIDE: 101 mmol/L (ref 101–111)
CO2: 25 mmol/L (ref 22–32)
CREATININE: 0.55 mg/dL (ref 0.44–1.00)
Calcium: 7.7 mg/dL — ABNORMAL LOW (ref 8.9–10.3)
GFR calc Af Amer: >60 mL/min (ref >60)
GFR calc non Af Amer: >60 mL/min (ref >60)
Glucose, Bld: 205 mg/dL — ABNORMAL HIGH (ref 65–99)
Potassium: 3.8 mmol/L (ref 3.5–5.1)
SODIUM: 138 mmol/L (ref 135–145)

## 2017-06-08 LAB — I-STAT TROPONIN, ED: Troponin i, poc: 0.02 ng/mL (ref 0.00–0.08)

## 2017-06-08 LAB — MAGNESIUM: MAGNESIUM: 1.1 mg/dL — AB (ref 1.7–2.4)

## 2017-06-08 MED ORDER — SODIUM CHLORIDE 0.9 % IV BOLUS (SEPSIS)
500.0000 mL | Freq: Once | INTRAVENOUS | Status: AC
  Administered 2017-06-08: 500 mL via INTRAVENOUS

## 2017-06-08 MED ORDER — ONDANSETRON 4 MG PO TBDP: 4.0000 mg | ORAL_TABLET | Freq: Three times a day (TID) | ORAL | 0 refills | Status: DC | PRN

## 2017-06-08 MED ORDER — SODIUM CHLORIDE 0.9 % IV SOLN
1.0000 g | Freq: Once | INTRAVENOUS | Status: AC
  Administered 2017-06-08: 1 g via INTRAVENOUS
  Filled 2017-06-08: qty 10

## 2017-06-08 MED ORDER — MAGNESIUM SULFATE 2 GM/50ML IV SOLN
2.0000 g | Freq: Once | INTRAVENOUS | Status: AC
  Administered 2017-06-08: 2 g via INTRAVENOUS
  Filled 2017-06-08: qty 50

## 2017-06-08 MED ORDER — MECLIZINE HCL 25 MG PO TABS: 12.5000 mg | ORAL_TABLET | Freq: Three times a day (TID) | ORAL | 0 refills | Status: DC | PRN

## 2017-06-08 NOTE — ED Notes (Signed)
Spoke with pharmacy, Magnesium and Calcium gluconate not compatible

## 2017-06-08 NOTE — ED Notes (Signed)
Pt noted to have possible infiltration to her IV site. Provider informed and assessed pt. Pharmacy notified with no further instructions. Pt states that it feels "sore" but no other complaints at this time. Pt sent home with PTAR. Home health to arrive at 0800 at pt home.

## 2017-06-08 NOTE — ED Provider Notes (Signed)
TIME SEEN: 12:35 AM  CHIEF COMPLAINT: Vertigo  HPI: Patient is a 83 year old female with history of rheumatoid arthritis who is wheelchair-bound, diabetes, hypertension, hypothyroidism who presents to the emergency department with an episode of vertigo tonight. States she felt like the room was spinning. States she took her vertigo pill which she thinks is meclizine and reports symptoms resolved. She did have one episode of nonbloody, nonbilious vomiting before her vertigo had resolved. She states that her caregiver at home called EMS because of the vertigo and vomiting. She states when EMS arrived she felt very anxious because she did not want to go to the hospital and her heart rate was elevated. Heart rate was noted to be in the 110s.  She denies ever having any chest pain or shortness of breath. Denies any diarrhea. No abdominal pain. She has chronic leg pain from her rheumatoid arthritis. She states her vertigo is gone. No headache, head injury, numbness, tingling or focal weakness. She states that she did not want to come to the emergency department.  ROS: See HPI Constitutional: no fever  Eyes: no drainage  ENT: no runny nose   Cardiovascular:  no chest pain  Resp: no SOB  GI: no vomiting GU: no dysuria Integumentary: no rash  Allergy: no hives  Musculoskeletal: no leg swelling  Neurological: no slurred speech ROS otherwise negative  PAST MEDICAL HISTORY/PAST SURGICAL HISTORY:  Past Medical History:  Diagnosis Date  . Acute bronchitis   . Anemia, unspecified   . Depressive disorder, not elsewhere classified   . Diverticulosis of colon (without mention of hemorrhage)   . Esophageal reflux   . Family history of malignant neoplasm of gastrointestinal tract   . Functional diarrhea   . Hiatal hernia   . Other and unspecified hyperlipidemia   . Other chest pain   . Other specified cardiac dysrhythmias(427.89)   . Personal history of colonic polyps 07/17/1995   adenomatous polyps   . PONV (postoperative nausea and vomiting)   . Rheumatoid arthritis(714.0)   . Tachycardia, unspecified   . Type II or unspecified type diabetes mellitus with other specified manifestations, not stated as uncontrolled   . Unspecified adverse effect of unspecified drug, medicinal and biological substance   . Unspecified essential hypertension   . Unspecified hypothyroidism     MEDICATIONS:  Prior to Admission medications   Medication Sig Start Date End Date Taking? Authorizing Provider  alendronate (FOSAMAX) 70 MG tablet  07/15/13   [provider]  cadexomer iodine (IODOSORB) 0.9 % gel Apply to affected area daily. 06/26/16   Trula Slade, DPM  calcium-vitamin D (OSCAL WITH D) 500-200 MG-UNIT per tablet Take 2 tablets by mouth daily.    [provider]  cephALEXin (KEFLEX) 500 MG capsule Take 1 capsule (500 mg total) by mouth 3 (three) times daily. 06/25/16   Trula Slade, DPM  DUREZOL 0.05 % EMUL  11/04/13   [provider]  folic acid (FOLVITE) 1 MG tablet Take 1 mg by mouth daily.      [provider]  glimepiride (AMARYL) 1 MG tablet Take 0.5 mg by mouth daily.     [provider]  hydroxychloroquine (PLAQUENIL) 200 MG tablet Take 200 mg by mouth 2 (two) times daily.      [provider]  levothyroxine (SYNTHROID, LEVOTHROID) 137 MCG tablet Take 137 mcg by mouth every morning.     [provider]  metFORMIN (GLUCOPHAGE-XR) 500 MG 24 hr tablet  03/07/15  [provider]  methotrexate (RHEUMATREX) 2.5 MG tablet Take 25 mg by mouth every Wednesday. Take 10 pills by mouth every Wednesday    [provider]  methotrexate (RHEUMATREX) 2.5 MG tablet  09/28/14   [provider]  mirtazapine (REMERON) 15 MG tablet  07/28/13   [provider]  Multiple Vitamin (MULTIVITAMIN WITH MINERALS) TABS Take 1 tablet by mouth daily.    [provider]  mupirocin ointment (BACTROBAN) 2 % Apply  to wound twice a day. 03/10/15   Hyatt, Max T, DPM  omeprazole (PRILOSEC) 40 MG capsule  01/31/15   [provider]  Pitavastatin Calcium (LIVALO) 2 MG TABS Take 2 mg by mouth every Monday, Wednesday, and Friday at 6 PM.     [provider]  silver sulfADIAZINE (SILVADENE) 1 % cream Apply 1 application topically daily. 11/18/15   Trula Slade, DPM  SitaGLIPtin Phosphate (JANUVIA PO) Take by mouth.    [provider]  SSD 1 % cream APPLY TO THE AFFECTED AREA ONCE A DAY. 09/08/15   Trula Slade, DPM  SYNTHROID 150 MCG tablet  09/30/14   [provider]  telmisartan (MICARDIS) 80 MG tablet Take 40 mg by mouth daily.     [provider]  VIGAMOX 0.5 % ophthalmic solution  11/04/13   [provider]  VOLTAREN 1 % GEL  11/30/13   [provider]    ALLERGIES:  No Known Allergies  SOCIAL HISTORY:  Social History  Substance Use Topics  . Smoking status: Never Smoker  . Smokeless tobacco: Never Used  . Alcohol use Yes     Comment: one glass of wine every Friday night.    FAMILY HISTORY: Family History  Problem Relation Age of Onset  . Colon cancer Maternal Grandmother     EXAM: BP 135/70   Pulse (!) 108   Temp 98 F (36.7 C) (Oral)   Resp (!) 29   Ht 5\' 6"  (1.676 m)   Wt 45.4 kg (100 lb)   SpO2 100%   BMI 16.14 kg/m  CONSTITUTIONAL: Alert and oriented and responds appropriately to questions. Well-appearing; well-nourished, elderly and thin but appears well hydrated and is nontoxic and very pleasant and in no distress HEAD: Normocephalic, atraumatic EYES: Conjunctivae clear, pupils appear equal, EOMI, no nystagmus ENT: normal nose; moist mucous membranes NECK: Supple, no meningismus, no nuchal rigidity, no LAD  CARD: Regular and intermittently tachycardic; S1 and S2 appreciated; no murmurs, no clicks, no rubs, no gallops RESP: Normal chest excursion without splinting or tachypnea; breath sounds clear and equal  bilaterally; no wheezes, no rhonchi, no rales, no hypoxia or respiratory distress, speaking full sentences ABD/GI: Normal bowel sounds; non-distended; soft, non-tender, no rebound, no guarding, no peritoneal signs, no hepatosplenomegaly BACK:  The back appears normal and is non-tender to palpation, there is no CVA tenderness EXT: Normal ROM in all joints; non-tender to palpation; no edema; normal capillary refill; no cyanosis, no calf tenderness or swelling, deformity noted to her joints in her arms and legs consistent with rheumatoid arthritis, no erythema or warmth or joint effusion noted to any joints    SKIN: Normal color for age and race; warm; no rash NEURO: Moves all extremities equally, sensation to light touch intact diffusely, cranial nerves II through XII intact, normal speech PSYCH: The patient's mood and manner are appropriate. Grooming and personal hygiene are appropriate.  MEDICAL DECISION MAKING: Patient here with vertigo that resolved after meclizine. She reports she has  had this many times in the past. No other focal neurologic deficits. She states that her caregiver called EMS and this caused her to be anxious and caused her to be tachycardic. EMS reports that patient was in atrial fibrillation but it appears that she is in sinus rhythm as I can clearly see P waves on the monitor and her EKG. She has occasional bigeminy. We will check her electrolytes to ensure there is no abnormality causing her ectopy. She denies any chest pain or shortness of breath. She has no focal neurologic deficits on exam. Denies a new complaints at this time.  ED PROGRESS: Patient's labs unremarkable other than hypocalcemia and hypomagnesemia. She has been given IV replacement. Her heart rate has improved and she is still a sinus rhythm. Troponin negative. Urine does show moderate leukocytes but no bacteria. She is not having urinary symptoms but I have sent a urine culture. She is not febrile here and has no  leukocytosis. Chest x-ray is clear. I recommended she follow-up with her primary care physician to have her electrolytes checked next week. Patient is comfortable with this plan. Plan is to discharge patient home. She states she has a son will be home with her this morning as well as home health providers. She is comfortable with this plan.   At this time, I do not feel there is any life-threatening condition present. I have reviewed and discussed all results (EKG, imaging, lab, urine as appropriate) and exam findings with patient/family. I have reviewed nursing notes and appropriate previous records.  I feel the patient is safe to be discharged home without further emergent workup and can continue workup as an outpatient as needed. Discussed usual and customary return precautions. Patient/family verbalize understanding and are comfortable with this plan.  Outpatient follow-up has been provided if needed. All questions have been answered.       EKG Interpretation  Date/Time:  Friday June 07 2017 23:49:22 EDT Ventricular Rate:  99 PR Interval:    QRS Duration: 81 QT Interval:  358 QTC Calculation: 460 R Axis:   46 Text Interpretation:  Sinus tachycardia Atrial premature complexes Probable LVH with secondary repol abnrm Confirmed by Pryor Curia [phone]) on 06/08/2017 12:27:41 AM         EKG Interpretation  Date/Time:  Saturday June 08 2017 03:29:48 EDT Ventricular Rate:  93 PR Interval:    QRS Duration: 90 QT Interval:  360 QTC Calculation: 448 R Axis:   36 Text Interpretation:  Sinus tachycardia Supraventricular bigeminy Consider left ventricular hypertrophy Nonspecific T abnormalities, lateral leads Confirmed by Pryor Curia [phone]) on 06/08/2017 4:00:02 AM          Juan Olthoff, Delice Bison, DO 06/08/17 [id number]

## 2017-06-08 NOTE — Discharge Instructions (Signed)
Take your medicine for vertigo as prescribed. Please follow-up closely with your primary care physician to have your magnesium and calcium levels rechecked next week.

## 2017-06-08 NOTE — ED Triage Notes (Signed)
Pt presents with [facility] for dizziness and n/v that began at 2200; pt was placed on cardiac monitor by EMS and found to be in Afib (no known hx); rate of 100-170bpm; pt denies CP and SOB; EMS also noted that CBG was elevated at 229

## 2017-06-08 NOTE — ED Notes (Signed)
Pt in XR. 

## 2017-06-09 LAB — URINE CULTURE

## 2017-06-12 DIAGNOSIS — I10 Essential (primary) hypertension: Secondary | ICD-10-CM | POA: Diagnosis not present

## 2017-06-12 DIAGNOSIS — E119 Type 2 diabetes mellitus without complications: Secondary | ICD-10-CM | POA: Diagnosis not present

## 2017-06-12 DIAGNOSIS — M069 Rheumatoid arthritis, unspecified: Secondary | ICD-10-CM | POA: Diagnosis not present

## 2017-06-12 DIAGNOSIS — M4800 Spinal stenosis, site unspecified: Secondary | ICD-10-CM | POA: Diagnosis not present

## 2017-06-12 DIAGNOSIS — M1991 Primary osteoarthritis, unspecified site: Secondary | ICD-10-CM | POA: Diagnosis not present

## 2017-06-12 DIAGNOSIS — M6281 Muscle weakness (generalized): Secondary | ICD-10-CM | POA: Diagnosis not present

## 2017-06-14 DIAGNOSIS — M1991 Primary osteoarthritis, unspecified site: Secondary | ICD-10-CM | POA: Diagnosis not present

## 2017-06-14 DIAGNOSIS — E119 Type 2 diabetes mellitus without complications: Secondary | ICD-10-CM | POA: Diagnosis not present

## 2017-06-14 DIAGNOSIS — I10 Essential (primary) hypertension: Secondary | ICD-10-CM | POA: Diagnosis not present

## 2017-06-14 DIAGNOSIS — M4800 Spinal stenosis, site unspecified: Secondary | ICD-10-CM | POA: Diagnosis not present

## 2017-06-14 DIAGNOSIS — M069 Rheumatoid arthritis, unspecified: Secondary | ICD-10-CM | POA: Diagnosis not present

## 2017-06-14 DIAGNOSIS — M6281 Muscle weakness (generalized): Secondary | ICD-10-CM | POA: Diagnosis not present

## 2017-06-17 DIAGNOSIS — M0579 Rheumatoid arthritis with rheumatoid factor of multiple sites without organ or systems involvement: Secondary | ICD-10-CM | POA: Diagnosis not present

## 2017-06-17 DIAGNOSIS — M79643 Pain in unspecified hand: Secondary | ICD-10-CM | POA: Diagnosis not present

## 2017-06-17 DIAGNOSIS — M810 Age-related osteoporosis without current pathological fracture: Secondary | ICD-10-CM | POA: Diagnosis not present

## 2017-06-17 DIAGNOSIS — Z79899 Other long term (current) drug therapy: Secondary | ICD-10-CM | POA: Diagnosis not present

## 2017-06-19 DIAGNOSIS — I10 Essential (primary) hypertension: Secondary | ICD-10-CM | POA: Diagnosis not present

## 2017-06-19 DIAGNOSIS — M4800 Spinal stenosis, site unspecified: Secondary | ICD-10-CM | POA: Diagnosis not present

## 2017-06-19 DIAGNOSIS — E119 Type 2 diabetes mellitus without complications: Secondary | ICD-10-CM | POA: Diagnosis not present

## 2017-06-19 DIAGNOSIS — M1991 Primary osteoarthritis, unspecified site: Secondary | ICD-10-CM | POA: Diagnosis not present

## 2017-06-19 DIAGNOSIS — M069 Rheumatoid arthritis, unspecified: Secondary | ICD-10-CM | POA: Diagnosis not present

## 2017-06-19 DIAGNOSIS — M6281 Muscle weakness (generalized): Secondary | ICD-10-CM | POA: Diagnosis not present

## 2017-06-21 DIAGNOSIS — M4800 Spinal stenosis, site unspecified: Secondary | ICD-10-CM | POA: Diagnosis not present

## 2017-06-21 DIAGNOSIS — I10 Essential (primary) hypertension: Secondary | ICD-10-CM | POA: Diagnosis not present

## 2017-06-21 DIAGNOSIS — M1991 Primary osteoarthritis, unspecified site: Secondary | ICD-10-CM | POA: Diagnosis not present

## 2017-06-21 DIAGNOSIS — E119 Type 2 diabetes mellitus without complications: Secondary | ICD-10-CM | POA: Diagnosis not present

## 2017-06-21 DIAGNOSIS — M6281 Muscle weakness (generalized): Secondary | ICD-10-CM | POA: Diagnosis not present

## 2017-06-21 DIAGNOSIS — M069 Rheumatoid arthritis, unspecified: Secondary | ICD-10-CM | POA: Diagnosis not present

## 2017-06-24 DIAGNOSIS — M6281 Muscle weakness (generalized): Secondary | ICD-10-CM | POA: Diagnosis not present

## 2017-06-24 DIAGNOSIS — M4800 Spinal stenosis, site unspecified: Secondary | ICD-10-CM | POA: Diagnosis not present

## 2017-06-24 DIAGNOSIS — M1991 Primary osteoarthritis, unspecified site: Secondary | ICD-10-CM | POA: Diagnosis not present

## 2017-06-24 DIAGNOSIS — M069 Rheumatoid arthritis, unspecified: Secondary | ICD-10-CM | POA: Diagnosis not present

## 2017-06-26 DIAGNOSIS — M069 Rheumatoid arthritis, unspecified: Secondary | ICD-10-CM | POA: Diagnosis not present

## 2017-06-26 DIAGNOSIS — M4800 Spinal stenosis, site unspecified: Secondary | ICD-10-CM | POA: Diagnosis not present

## 2017-06-26 DIAGNOSIS — E119 Type 2 diabetes mellitus without complications: Secondary | ICD-10-CM | POA: Diagnosis not present

## 2017-06-26 DIAGNOSIS — M6281 Muscle weakness (generalized): Secondary | ICD-10-CM | POA: Diagnosis not present

## 2017-06-26 DIAGNOSIS — M1991 Primary osteoarthritis, unspecified site: Secondary | ICD-10-CM | POA: Diagnosis not present

## 2017-06-26 DIAGNOSIS — I10 Essential (primary) hypertension: Secondary | ICD-10-CM | POA: Diagnosis not present

## 2017-06-27 DIAGNOSIS — I10 Essential (primary) hypertension: Secondary | ICD-10-CM | POA: Diagnosis not present

## 2017-06-27 DIAGNOSIS — M069 Rheumatoid arthritis, unspecified: Secondary | ICD-10-CM | POA: Diagnosis not present

## 2017-06-27 DIAGNOSIS — M6281 Muscle weakness (generalized): Secondary | ICD-10-CM | POA: Diagnosis not present

## 2017-06-27 DIAGNOSIS — M1991 Primary osteoarthritis, unspecified site: Secondary | ICD-10-CM | POA: Diagnosis not present

## 2017-06-27 DIAGNOSIS — M4800 Spinal stenosis, site unspecified: Secondary | ICD-10-CM | POA: Diagnosis not present

## 2017-06-27 DIAGNOSIS — E119 Type 2 diabetes mellitus without complications: Secondary | ICD-10-CM | POA: Diagnosis not present

## 2017-06-28 DIAGNOSIS — H6123 Impacted cerumen, bilateral: Secondary | ICD-10-CM | POA: Diagnosis not present

## 2017-06-28 DIAGNOSIS — Z972 Presence of dental prosthetic device (complete) (partial): Secondary | ICD-10-CM | POA: Diagnosis not present

## 2017-06-28 DIAGNOSIS — H903 Sensorineural hearing loss, bilateral: Secondary | ICD-10-CM | POA: Diagnosis not present

## 2017-06-28 DIAGNOSIS — R42 Dizziness and giddiness: Secondary | ICD-10-CM | POA: Diagnosis not present

## 2017-07-02 DIAGNOSIS — E119 Type 2 diabetes mellitus without complications: Secondary | ICD-10-CM | POA: Diagnosis not present

## 2017-07-02 DIAGNOSIS — M1991 Primary osteoarthritis, unspecified site: Secondary | ICD-10-CM | POA: Diagnosis not present

## 2017-07-02 DIAGNOSIS — M4800 Spinal stenosis, site unspecified: Secondary | ICD-10-CM | POA: Diagnosis not present

## 2017-07-02 DIAGNOSIS — M6281 Muscle weakness (generalized): Secondary | ICD-10-CM | POA: Diagnosis not present

## 2017-07-02 DIAGNOSIS — I10 Essential (primary) hypertension: Secondary | ICD-10-CM | POA: Diagnosis not present

## 2017-07-02 DIAGNOSIS — M069 Rheumatoid arthritis, unspecified: Secondary | ICD-10-CM | POA: Diagnosis not present

## 2017-07-04 DIAGNOSIS — M1991 Primary osteoarthritis, unspecified site: Secondary | ICD-10-CM | POA: Diagnosis not present

## 2017-07-04 DIAGNOSIS — M6281 Muscle weakness (generalized): Secondary | ICD-10-CM | POA: Diagnosis not present

## 2017-07-04 DIAGNOSIS — E119 Type 2 diabetes mellitus without complications: Secondary | ICD-10-CM | POA: Diagnosis not present

## 2017-07-04 DIAGNOSIS — M069 Rheumatoid arthritis, unspecified: Secondary | ICD-10-CM | POA: Diagnosis not present

## 2017-07-04 DIAGNOSIS — I10 Essential (primary) hypertension: Secondary | ICD-10-CM | POA: Diagnosis not present

## 2017-07-04 DIAGNOSIS — M4800 Spinal stenosis, site unspecified: Secondary | ICD-10-CM | POA: Diagnosis not present

## 2017-07-09 DIAGNOSIS — M6281 Muscle weakness (generalized): Secondary | ICD-10-CM | POA: Diagnosis not present

## 2017-07-09 DIAGNOSIS — E119 Type 2 diabetes mellitus without complications: Secondary | ICD-10-CM | POA: Diagnosis not present

## 2017-07-09 DIAGNOSIS — M4800 Spinal stenosis, site unspecified: Secondary | ICD-10-CM | POA: Diagnosis not present

## 2017-07-09 DIAGNOSIS — M1991 Primary osteoarthritis, unspecified site: Secondary | ICD-10-CM | POA: Diagnosis not present

## 2017-07-09 DIAGNOSIS — M069 Rheumatoid arthritis, unspecified: Secondary | ICD-10-CM | POA: Diagnosis not present

## 2017-07-09 DIAGNOSIS — I10 Essential (primary) hypertension: Secondary | ICD-10-CM | POA: Diagnosis not present

## 2017-07-11 ENCOUNTER — Ambulatory Visit: Payer: Medicare Other | Admitting: Podiatry

## 2017-07-11 DIAGNOSIS — M069 Rheumatoid arthritis, unspecified: Secondary | ICD-10-CM | POA: Diagnosis not present

## 2017-07-11 DIAGNOSIS — E119 Type 2 diabetes mellitus without complications: Secondary | ICD-10-CM | POA: Diagnosis not present

## 2017-07-11 DIAGNOSIS — M4800 Spinal stenosis, site unspecified: Secondary | ICD-10-CM | POA: Diagnosis not present

## 2017-07-11 DIAGNOSIS — M6281 Muscle weakness (generalized): Secondary | ICD-10-CM | POA: Diagnosis not present

## 2017-07-11 DIAGNOSIS — M1991 Primary osteoarthritis, unspecified site: Secondary | ICD-10-CM | POA: Diagnosis not present

## 2017-07-11 DIAGNOSIS — I10 Essential (primary) hypertension: Secondary | ICD-10-CM | POA: Diagnosis not present

## 2017-07-15 DIAGNOSIS — E119 Type 2 diabetes mellitus without complications: Secondary | ICD-10-CM | POA: Diagnosis not present

## 2017-07-15 DIAGNOSIS — I10 Essential (primary) hypertension: Secondary | ICD-10-CM | POA: Diagnosis not present

## 2017-07-15 DIAGNOSIS — E7800 Pure hypercholesterolemia, unspecified: Secondary | ICD-10-CM | POA: Diagnosis not present

## 2017-07-16 DIAGNOSIS — I10 Essential (primary) hypertension: Secondary | ICD-10-CM | POA: Diagnosis not present

## 2017-07-16 DIAGNOSIS — M4800 Spinal stenosis, site unspecified: Secondary | ICD-10-CM | POA: Diagnosis not present

## 2017-07-16 DIAGNOSIS — E119 Type 2 diabetes mellitus without complications: Secondary | ICD-10-CM | POA: Diagnosis not present

## 2017-07-16 DIAGNOSIS — M069 Rheumatoid arthritis, unspecified: Secondary | ICD-10-CM | POA: Diagnosis not present

## 2017-07-16 DIAGNOSIS — M1991 Primary osteoarthritis, unspecified site: Secondary | ICD-10-CM | POA: Diagnosis not present

## 2017-07-16 DIAGNOSIS — M6281 Muscle weakness (generalized): Secondary | ICD-10-CM | POA: Diagnosis not present

## 2017-07-22 DIAGNOSIS — M069 Rheumatoid arthritis, unspecified: Secondary | ICD-10-CM | POA: Diagnosis not present

## 2017-07-22 DIAGNOSIS — I10 Essential (primary) hypertension: Secondary | ICD-10-CM | POA: Diagnosis not present

## 2017-07-22 DIAGNOSIS — E119 Type 2 diabetes mellitus without complications: Secondary | ICD-10-CM | POA: Diagnosis not present

## 2017-07-22 DIAGNOSIS — M6281 Muscle weakness (generalized): Secondary | ICD-10-CM | POA: Diagnosis not present

## 2017-07-22 DIAGNOSIS — M1991 Primary osteoarthritis, unspecified site: Secondary | ICD-10-CM | POA: Diagnosis not present

## 2017-07-22 DIAGNOSIS — M4800 Spinal stenosis, site unspecified: Secondary | ICD-10-CM | POA: Diagnosis not present

## 2017-07-24 ENCOUNTER — Emergency Department (HOSPITAL_COMMUNITY): Payer: Medicare Other

## 2017-07-24 ENCOUNTER — Encounter (HOSPITAL_COMMUNITY): Payer: Self-pay

## 2017-07-24 ENCOUNTER — Emergency Department (HOSPITAL_COMMUNITY)
Admission: EM | Admit: 2017-07-24 | Discharge: 2017-07-24 | Disposition: A | Discharge status: Home / Self Care | Payer: Medicare Other | Attending: Emergency Medicine | Admitting: Emergency Medicine

## 2017-07-24 DIAGNOSIS — M810 Age-related osteoporosis without current pathological fracture: Secondary | ICD-10-CM | POA: Diagnosis not present

## 2017-07-24 DIAGNOSIS — Z7984 Long term (current) use of oral hypoglycemic drugs: Secondary | ICD-10-CM | POA: Diagnosis not present

## 2017-07-24 DIAGNOSIS — Y939 Activity, unspecified: Secondary | ICD-10-CM | POA: Diagnosis not present

## 2017-07-24 DIAGNOSIS — E039 Hypothyroidism, unspecified: Secondary | ICD-10-CM | POA: Insufficient documentation

## 2017-07-24 DIAGNOSIS — S79911A Unspecified injury of right hip, initial encounter: Secondary | ICD-10-CM | POA: Diagnosis not present

## 2017-07-24 DIAGNOSIS — S4991XA Unspecified injury of right shoulder and upper arm, initial encounter: Secondary | ICD-10-CM | POA: Diagnosis not present

## 2017-07-24 DIAGNOSIS — Y999 Unspecified external cause status: Secondary | ICD-10-CM | POA: Insufficient documentation

## 2017-07-24 DIAGNOSIS — Y929 Unspecified place or not applicable: Secondary | ICD-10-CM | POA: Insufficient documentation

## 2017-07-24 DIAGNOSIS — Z79899 Other long term (current) drug therapy: Secondary | ICD-10-CM | POA: Insufficient documentation

## 2017-07-24 DIAGNOSIS — S0990XA Unspecified injury of head, initial encounter: Secondary | ICD-10-CM | POA: Insufficient documentation

## 2017-07-24 DIAGNOSIS — E7800 Pure hypercholesterolemia, unspecified: Secondary | ICD-10-CM | POA: Diagnosis not present

## 2017-07-24 DIAGNOSIS — M0579 Rheumatoid arthritis with rheumatoid factor of multiple sites without organ or systems involvement: Secondary | ICD-10-CM | POA: Diagnosis not present

## 2017-07-24 DIAGNOSIS — L97529 Non-pressure chronic ulcer of other part of left foot with unspecified severity: Secondary | ICD-10-CM | POA: Insufficient documentation

## 2017-07-24 DIAGNOSIS — M25511 Pain in right shoulder: Secondary | ICD-10-CM | POA: Diagnosis not present

## 2017-07-24 DIAGNOSIS — I10 Essential (primary) hypertension: Secondary | ICD-10-CM | POA: Insufficient documentation

## 2017-07-24 DIAGNOSIS — W010XXA Fall on same level from slipping, tripping and stumbling without subsequent striking against object, initial encounter: Secondary | ICD-10-CM | POA: Insufficient documentation

## 2017-07-24 DIAGNOSIS — B999 Unspecified infectious disease: Secondary | ICD-10-CM | POA: Diagnosis not present

## 2017-07-24 DIAGNOSIS — M25551 Pain in right hip: Secondary | ICD-10-CM | POA: Diagnosis not present

## 2017-07-24 DIAGNOSIS — T148XXA Other injury of unspecified body region, initial encounter: Secondary | ICD-10-CM | POA: Diagnosis not present

## 2017-07-24 DIAGNOSIS — E11621 Type 2 diabetes mellitus with foot ulcer: Secondary | ICD-10-CM | POA: Insufficient documentation

## 2017-07-24 DIAGNOSIS — E119 Type 2 diabetes mellitus without complications: Secondary | ICD-10-CM | POA: Diagnosis not present

## 2017-07-24 DIAGNOSIS — N390 Urinary tract infection, site not specified: Secondary | ICD-10-CM

## 2017-07-24 DIAGNOSIS — E1149 Type 2 diabetes mellitus with other diabetic neurological complication: Secondary | ICD-10-CM | POA: Diagnosis not present

## 2017-07-24 DIAGNOSIS — W19XXXA Unspecified fall, initial encounter: Secondary | ICD-10-CM

## 2017-07-24 LAB — CBC WITH DIFFERENTIAL/PLATELET
Basophils Absolute: 0 10*3/uL (ref 0.0–0.1)
Basophils Relative: 0 %
Eosinophils Absolute: 0 10*3/uL (ref 0.0–0.7)
Eosinophils Relative: 1 %
HCT: 41 % (ref 36.0–46.0)
Hemoglobin: 13.2 g/dL (ref 12.0–15.0)
Lymphocytes Relative: 9 %
Lymphs Abs: 0.7 10*3/uL (ref 0.7–4.0)
MCH: 28.8 pg (ref 26.0–34.0)
MCHC: 32.2 g/dL (ref 30.0–36.0)
MCV: 89.5 fL (ref 78.0–100.0)
Monocytes Absolute: 0.7 10*3/uL (ref 0.1–1.0)
Monocytes Relative: 8 %
Neutro Abs: 7 10*3/uL (ref 1.7–7.7)
Neutrophils Relative %: 82 %
Platelets: 262 10*3/uL (ref 150–400)
RBC: 4.58 MIL/uL (ref 3.87–5.11)
RDW: 15.2 % (ref 11.5–15.5)
WBC: 8.4 10*3/uL (ref 4.0–10.5)

## 2017-07-24 LAB — CK: Total CK: 1062 U/L — ABNORMAL HIGH (ref 38–234)

## 2017-07-24 LAB — BASIC METABOLIC PANEL
Anion gap: 13 (ref 5–15)
BUN: 12 mg/dL (ref 6–20)
CO2: 24 mmol/L (ref 22–32)
Calcium: 8.3 mg/dL — ABNORMAL LOW (ref 8.9–10.3)
Chloride: 105 mmol/L (ref 101–111)
Creatinine, Ser: 0.47 mg/dL (ref 0.44–1.00)
GFR calc Af Amer: >60 mL/min (ref >60)
GFR calc non Af Amer: >60 mL/min (ref >60)
Glucose, Bld: 165 mg/dL — ABNORMAL HIGH (ref 65–99)
Potassium: 3.2 mmol/L — ABNORMAL LOW (ref 3.5–5.1)
Sodium: 142 mmol/L (ref 135–145)

## 2017-07-24 LAB — URINALYSIS, ROUTINE W REFLEX MICROSCOPIC
Bilirubin Urine: NEGATIVE
Glucose, UA: NEGATIVE mg/dL
Ketones, ur: 20 mg/dL — AB
Nitrite: NEGATIVE
Protein, ur: NEGATIVE mg/dL
Specific Gravity, Urine: 1.016 (ref 1.005–1.030)
pH: 5 (ref 5.0–8.0)

## 2017-07-24 MED ORDER — MORPHINE SULFATE (PF) 4 MG/ML IV SOLN
4.0000 mg | Freq: Once | INTRAVENOUS | Status: DC
  Filled 2017-07-24: qty 1

## 2017-07-24 MED ORDER — CEPHALEXIN 500 MG PO CAPS: 1000.0000 mg | ORAL_CAPSULE | Freq: Two times a day (BID) | ORAL | 0 refills | Status: DC

## 2017-07-24 NOTE — ED Provider Notes (Signed)
[facility] DEPT Provider Note   CSN: [id number] Arrival date & time: 07/24/17  4098     History   Chief Complaint Chief Complaint  Patient presents with  . Fall    HPI [name] is a 83 y.o. female.  HPI Patient presents to the emergency department following a fall that occurred earlier this morning.  The patient states that she got up out of bed to go to the bathroom and she stumbled and fell.  Patient states that she does not think she lost consciousness.  Patient states she thinks she fell around 6 AM.  The patient denies chest pain, shortness of breath, headache,blurred vision, neck pain, fever, cough, weakness, numbness, dizziness, anorexia, edema, abdominal pain, nausea, vomiting, diarrhea, rash, back pain, dysuria, hematemesis, bloody stool, near syncope, or syncope. Past Medical History:  Diagnosis Date  . Acute bronchitis   . Anemia, unspecified   . Depressive disorder, not elsewhere classified   . Diverticulosis of colon (without mention of hemorrhage)   . Esophageal reflux   . Family history of malignant neoplasm of gastrointestinal tract   . Functional diarrhea   . Hiatal hernia   . Other and unspecified hyperlipidemia   . Other chest pain   . Other specified cardiac dysrhythmias(427.89)   . Personal history of colonic polyps 07/17/1995   adenomatous polyps  . PONV (postoperative nausea and vomiting)   . Rheumatoid arthritis(714.0)   . Tachycardia, unspecified   . Type II or unspecified type diabetes mellitus with other specified manifestations, not stated as uncontrolled   . Unspecified adverse effect of unspecified drug, medicinal and biological substance   . Unspecified essential hypertension   . Unspecified hypothyroidism     Patient Active Problem List   Diagnosis Date Noted  . Diabetic ulcer of left foot associated with type 2 diabetes mellitus ([place]) 08/11/2015  . Closed right hip fracture ([place]) 08/28/2012  .  Rheumatoid arthritis(714.0) 08/28/2012  . History of gastroesophageal reflux (GERD) 06/26/2011  . Diverticulosis 06/26/2011  . Lower GI bleeding 06/26/2011  . DEPRESSION 03/31/2009  . DIARRHEA, FUNCTIONAL 03/31/2009  . DIABETIC HYPOGLYCEMIA, TYPE II 11/03/2008  . ANEMIA, MILD 11/03/2008  . ACUTE BRONCHITIS 08/23/2008  . AV NODAL REENTRY TACHYCARDIA 10/29/2007  . TACHYCARDIA 10/29/2007  . CHEST PAIN, ATYPICAL 10/25/2007  . ADVEF, DRUG/MEDICINAL/BIOLOGICAL SUBST NOS 07/16/2007  . Nonspecific (abnormal) findings on radiological and other examination of body structure 04/03/2007  . CHEST XRAY, ABNORMAL 04/03/2007  . HYPOTHYROIDISM 03/06/2007  . DIABETES MELLITUS, TYPE II 03/06/2007  . HYPERLIPIDEMIA 03/06/2007  . HYPERTENSION 03/06/2007  . DIVERTICULOSIS, COLON 03/06/2007  . ARTHRITIS, RHEUMATOID, HX OF 03/06/2007    Past Surgical History:  Procedure Laterality Date  . CATARACT EXTRACTION     left  . ORIF FEMUR FRACTURE  08/30/2012   Procedure: OPEN REDUCTION INTERNAL FIXATION (ORIF) DISTAL FEMUR FRACTURE;  Surgeon: Mauri Pole, MD;  Location: [facility];  Service: Orthopedics;  Laterality: Right;  proximal femur  . PARTIAL HYSTERECTOMY    . THYROIDECTOMY, PARTIAL      OB History    No data available       Home Medications    Prior to Admission medications   Medication Sig Start Date End Date Taking? Authorizing Provider  alendronate (FOSAMAX) 70 MG tablet Take 70 mg by mouth every Saturday.  07/15/13   [provider]  cephALEXin (KEFLEX) 500 MG capsule Take 2 capsules (1,000 mg total) by mouth 2 (two) times daily. 07/24/17  Newt Levingston, Harrell Gave, PA-C  linagliptin (TRADJENTA) 5 MG TABS tablet Take 5 mg by mouth daily.    [provider]  meclizine (ANTIVERT) 25 MG tablet Take 0.5-1 tablets (12.5-25 mg total) by mouth 3 (three) times daily as needed for dizziness. 06/08/17   Ward, Delice Bison, DO  metFORMIN (GLUCOPHAGE-XR) 500 MG 24 hr tablet Take 500-1,000 mg  by mouth See admin instructions. Take 2 tablets in the morning, and 1 tablet in the evening 03/07/15   [provider]  ondansetron (ZOFRAN ODT) 4 MG disintegrating tablet Take 1 tablet (4 mg total) by mouth every 8 (eight) hours as needed for nausea or vomiting. 06/08/17   Ward, Delice Bison, DO  Pitavastatin Calcium (LIVALO) 2 MG TABS Take 2 mg by mouth every Monday, Wednesday, and Friday at 6 PM.     [provider]  predniSONE (DELTASONE) 1 MG tablet Take 4 mg by mouth daily with breakfast.    [provider]  SYNTHROID 150 MCG tablet Take 150 mcg by mouth daily before breakfast.  09/30/14   [provider]  VOLTAREN 1 % GEL Apply 2 g topically 4 (four) times daily.  11/30/13   [provider]    Family History Family History  Problem Relation Age of Onset  . Colon cancer Maternal Grandmother     Social History Social History  Substance Use Topics  . Smoking status: Never Smoker  . Smokeless tobacco: Never Used  . Alcohol use Yes     Comment: one glass of wine every Friday night.     Allergies   Patient has no known allergies.   Review of Systems Review of Systems All other systems negative except as documented in the HPI. All pertinent positives and negatives as reviewed in the HPI.  Physical Exam Updated Vital Signs BP (!) 156/80 (BP Location: Left Arm)   Pulse (!) 56   Temp 98.1 F (36.7 C) (Oral)   Resp 18   Ht 5\' 5"  (1.651 m)   Wt 46.3 kg (102 lb)   SpO2 98%   BMI 16.97 kg/m   Physical Exam  Constitutional: She is oriented to person, place, and time. She appears well-developed and well-nourished. No distress.  HENT:  Head: Normocephalic and atraumatic.  Mouth/Throat: Oropharynx is clear and moist.  Eyes: Pupils are equal, round, and reactive to light.  Neck: Normal range of motion. Neck supple.  Cardiovascular: Normal rate, regular rhythm and normal heart sounds.  Exam reveals no gallop and no friction rub.   No murmur  heard. Pulmonary/Chest: Effort normal and breath sounds normal. No respiratory distress. She has no wheezes.  Abdominal: Soft. Bowel sounds are normal. She exhibits no distension. There is no tenderness. There is no guarding.  Musculoskeletal:       Right shoulder: She exhibits tenderness. She exhibits normal range of motion and no bony tenderness.       Right hip: She exhibits tenderness. She exhibits no swelling, no crepitus and no deformity.  Neurological: She is alert and oriented to person, place, and time. No sensory deficit. She exhibits normal muscle tone. Coordination normal.  Skin: Skin is warm and dry. Capillary refill takes less than 2 seconds. No rash noted. No erythema.  Psychiatric: She has a normal mood and affect. Her behavior is normal.  Nursing note and vitals reviewed.    ED Treatments / Results  Labs (all labs ordered are listed, but only abnormal results are displayed) Labs Reviewed  BASIC METABOLIC PANEL -  Abnormal; Notable for the following:       Result Value   Potassium 3.2 (*)    Glucose, Bld 165 (*)    Calcium 8.3 (*)    All other components within normal limits  CK - Abnormal; Notable for the following:    Total CK 1,062 (*)    All other components within normal limits  URINALYSIS, ROUTINE W REFLEX MICROSCOPIC - Abnormal; Notable for the following:    APPearance HAZY (*)    Hgb urine dipstick MODERATE (*)    Ketones, ur 20 (*)    Leukocytes, UA TRACE (*)    Bacteria, UA MANY (*)    Squamous Epithelial / LPF 0-5 (*)    All other components within normal limits  CBC WITH DIFFERENTIAL/PLATELET    EKG  EKG Interpretation None       Radiology Dg Shoulder Right  Result Date: 07/24/2017 CLINICAL DATA:  Right hip pain, bilateral shoulder pain.  Fall. EXAM: RIGHT SHOULDER - 2+ VIEW COMPARISON:  None. FINDINGS: Degenerative changes in the right [facility] and glenohumeral joints with joint space narrowing and spurring. No acute bony abnormality.  Specifically, no fracture, subluxation, or dislocation. Soft tissues are intact. IMPRESSION: No acute bony abnormality. Electronically Signed   By: Rolm Baptise M.D.   On: 07/24/2017 10:39   Ct Head Wo Contrast  Result Date: 07/24/2017 CLINICAL DATA:  Head trauma.  Found down. EXAM: CT HEAD WITHOUT CONTRAST TECHNIQUE: Contiguous axial images were obtained from the base of the skull through the vertex without intravenous contrast. COMPARISON:  02/02/2017 FINDINGS: Brain: There is atrophy and chronic small vessel disease changes. No acute intracranial abnormality. Specifically, no hemorrhage, hydrocephalus, mass lesion, acute infarction, or significant intracranial injury. Vascular: No hyperdense vessel or unexpected calcification. Skull: No acute calvarial abnormality. Sinuses/Orbits: Visualized paranasal sinuses and mastoids clear. Orbital soft tissues unremarkable. Other: None IMPRESSION: No acute intracranial abnormality. Atrophy, chronic microvascular disease. Electronically Signed   By: Rolm Baptise M.D.   On: 07/24/2017 10:51   Dg Hip Unilat W Or Wo Pelvis 2-3 Views Right  Result Date: 07/24/2017 CLINICAL DATA:  Fall.  Right hip pain. EXAM: DG HIP (WITH OR WITHOUT PELVIS) 2-3V RIGHT COMPARISON:  None. FINDINGS: Postoperative changes in the right hip from prior internal fixation. Mild to moderate degenerative changes in the hips bilaterally. Diffuse osteopenia. No acute bony abnormality. Specifically, no fracture, subluxation, or dislocation. Soft tissues are intact. IMPRESSION: No acute bony abnormality. Postoperative changes in the right proximal femur. Electronically Signed   By: Rolm Baptise M.D.   On: 07/24/2017 10:39    Procedures Procedures (including critical care time)  Medications Ordered in ED Medications  morphine 4 MG/ML injection 4 mg (4 mg Intravenous Refused 07/24/17 1059)     Initial Impression / Assessment and Plan / ED Course  I have reviewed the triage vital signs and  the nursing notes.  Pertinent labs & imaging results that were available during my care of the patient were reviewed by me and considered in my medical decision making (see chart for details).     Patient will be discharged home and follow-up with her primary doctor.  The patient is alert and oriented x3 she is advised she most likely has a urinary tract infection as well.  The patient laboratory testing other than her CK did not show any significant abnormality.   Final Clinical Impressions(s) / ED Diagnoses   Final diagnoses:  Fall, initial encounter  Lower urinary tract infectious disease  New Prescriptions New Prescriptions   CEPHALEXIN (KEFLEX) 500 MG CAPSULE    Take 2 capsules (1,000 mg total) by mouth 2 (two) times daily.     Dalia Heading, PA-C 07/24/17 1532    Davonna Belling, MD 07/24/17 1536

## 2017-07-24 NOTE — ED Triage Notes (Signed)
EMS reports pt found on floor by home health c/o pain right hip and bilateral shoulder. Unknown LOC unknown down. Pt norm wheelchair. Home health reports that Pt has been confused last couple weeks.  BP 158/91 HR 100  Resp 16 CBG 236 Pt diabetic

## 2017-07-24 NOTE — ED Notes (Signed)
Bed: WA10 Expected date:  Expected time:  Means of arrival:  Comments: EMS hip pain 

## 2017-07-24 NOTE — ED Notes (Signed)
Patient transported to X-ray 

## 2017-07-24 NOTE — Discharge Instructions (Signed)
Return here as needed. Follow up with your doctor. °

## 2017-07-24 NOTE — ED Notes (Signed)
Patient transported to CT 

## 2017-07-24 NOTE — ED Notes (Signed)
Son [name] called. Per him, home health will meet PTAR at the home. He states that he will arrange the meeting.

## 2017-07-26 DIAGNOSIS — M6281 Muscle weakness (generalized): Secondary | ICD-10-CM | POA: Diagnosis not present

## 2017-07-26 DIAGNOSIS — M1991 Primary osteoarthritis, unspecified site: Secondary | ICD-10-CM | POA: Diagnosis not present

## 2017-07-26 DIAGNOSIS — M069 Rheumatoid arthritis, unspecified: Secondary | ICD-10-CM | POA: Diagnosis not present

## 2017-07-26 DIAGNOSIS — I10 Essential (primary) hypertension: Secondary | ICD-10-CM | POA: Diagnosis not present

## 2017-07-26 DIAGNOSIS — M4800 Spinal stenosis, site unspecified: Secondary | ICD-10-CM | POA: Diagnosis not present

## 2017-07-26 DIAGNOSIS — E119 Type 2 diabetes mellitus without complications: Secondary | ICD-10-CM | POA: Diagnosis not present

## 2017-07-29 DIAGNOSIS — M4800 Spinal stenosis, site unspecified: Secondary | ICD-10-CM | POA: Diagnosis not present

## 2017-07-29 DIAGNOSIS — M1991 Primary osteoarthritis, unspecified site: Secondary | ICD-10-CM | POA: Diagnosis not present

## 2017-07-29 DIAGNOSIS — I10 Essential (primary) hypertension: Secondary | ICD-10-CM | POA: Diagnosis not present

## 2017-07-29 DIAGNOSIS — M6281 Muscle weakness (generalized): Secondary | ICD-10-CM | POA: Diagnosis not present

## 2017-07-29 DIAGNOSIS — M069 Rheumatoid arthritis, unspecified: Secondary | ICD-10-CM | POA: Diagnosis not present

## 2017-07-29 DIAGNOSIS — E119 Type 2 diabetes mellitus without complications: Secondary | ICD-10-CM | POA: Diagnosis not present

## 2017-08-01 ENCOUNTER — Encounter (HOSPITAL_COMMUNITY): Payer: Self-pay | Admitting: *Deleted

## 2017-08-01 ENCOUNTER — Emergency Department (HOSPITAL_COMMUNITY): Payer: Medicare Other

## 2017-08-01 ENCOUNTER — Emergency Department (HOSPITAL_COMMUNITY)
Admission: EM | Admit: 2017-08-01 | Discharge: 2017-08-01 | Disposition: A | Discharge status: Home / Self Care | Payer: Medicare Other | Attending: Emergency Medicine | Admitting: Emergency Medicine

## 2017-08-01 DIAGNOSIS — Z79899 Other long term (current) drug therapy: Secondary | ICD-10-CM | POA: Diagnosis not present

## 2017-08-01 DIAGNOSIS — I10 Essential (primary) hypertension: Secondary | ICD-10-CM | POA: Diagnosis not present

## 2017-08-01 DIAGNOSIS — E119 Type 2 diabetes mellitus without complications: Secondary | ICD-10-CM | POA: Insufficient documentation

## 2017-08-01 DIAGNOSIS — E039 Hypothyroidism, unspecified: Secondary | ICD-10-CM | POA: Insufficient documentation

## 2017-08-01 DIAGNOSIS — S7012XA Contusion of left thigh, initial encounter: Secondary | ICD-10-CM | POA: Diagnosis not present

## 2017-08-01 DIAGNOSIS — Y939 Activity, unspecified: Secondary | ICD-10-CM | POA: Insufficient documentation

## 2017-08-01 DIAGNOSIS — Y9301 Activity, walking, marching and hiking: Secondary | ICD-10-CM | POA: Diagnosis not present

## 2017-08-01 DIAGNOSIS — W0110XA Fall on same level from slipping, tripping and stumbling with subsequent striking against unspecified object, initial encounter: Secondary | ICD-10-CM | POA: Diagnosis not present

## 2017-08-01 DIAGNOSIS — Y999 Unspecified external cause status: Secondary | ICD-10-CM | POA: Diagnosis not present

## 2017-08-01 DIAGNOSIS — Z7984 Long term (current) use of oral hypoglycemic drugs: Secondary | ICD-10-CM | POA: Insufficient documentation

## 2017-08-01 DIAGNOSIS — W19XXXA Unspecified fall, initial encounter: Secondary | ICD-10-CM

## 2017-08-01 DIAGNOSIS — S7002XA Contusion of left hip, initial encounter: Secondary | ICD-10-CM | POA: Diagnosis not present

## 2017-08-01 DIAGNOSIS — Y929 Unspecified place or not applicable: Secondary | ICD-10-CM | POA: Diagnosis not present

## 2017-08-01 DIAGNOSIS — M4800 Spinal stenosis, site unspecified: Secondary | ICD-10-CM | POA: Diagnosis not present

## 2017-08-01 DIAGNOSIS — M25552 Pain in left hip: Secondary | ICD-10-CM | POA: Diagnosis not present

## 2017-08-01 DIAGNOSIS — M6281 Muscle weakness (generalized): Secondary | ICD-10-CM | POA: Diagnosis not present

## 2017-08-01 DIAGNOSIS — M1991 Primary osteoarthritis, unspecified site: Secondary | ICD-10-CM | POA: Diagnosis not present

## 2017-08-01 DIAGNOSIS — M069 Rheumatoid arthritis, unspecified: Secondary | ICD-10-CM | POA: Diagnosis not present

## 2017-08-01 DIAGNOSIS — S79912A Unspecified injury of left hip, initial encounter: Secondary | ICD-10-CM | POA: Diagnosis not present

## 2017-08-01 NOTE — ED Notes (Signed)
Bed: IA16 Expected date:  Expected time:  Means of arrival:  Comments: [place]

## 2017-08-01 NOTE — ED Notes (Signed)
Pt came with Home Health Aid from [facility]. Aid told registration that they are unable to stay with her while she is here. Aid also stated that when the Pt is discharged to call [facility]  [phone] and they will send an aid out to her house. Aid stated they are not coming back to pick her up and are unsure of how she will get home.

## 2017-08-01 NOTE — ED Triage Notes (Signed)
Pt complains of left hip pain since falling last night. Pt states she fell trying to close her blinds before going to bed. Pt fell on carpet. Pt denies head injury or loss of consciousness.

## 2017-08-01 NOTE — Discharge Instructions (Signed)
Take tylenol 1000mg(2 extra strength) four times a day.  ° °

## 2017-08-01 NOTE — ED Notes (Signed)
This nurse spoke with Merleen Nicely at [facility] on the phone. Per Merleen Nicely, there would be no available aide to pick up the patient from the hospital. The patient will have an aide at her house at 2300, but not any sooner. Merleen Nicely asked this nurse to call an ambulance for the patient. This nurse explained that the patient did not meet PTAR requirements. It was decided by Merleen Nicely that she would order an [facility] to pick up the patient from [facility] at 2245. Merleen Nicely requested that the staff of [facility] assist the patient into the [facility] and place her wheelchair in the car. Merleen Nicely states the patient's aide this evening will meet the [facility] at the patient's house and assist the patient out of the [place] and into the house. Patient updated on this plan. Patient very unhappy about the plan and the fact that she has to wait until 2245, but agrees.

## 2017-08-01 NOTE — ED Provider Notes (Signed)
[facility] DEPT Provider Note   CSN: [id number] Arrival date & time: 08/01/17  1303     History   Chief Complaint Chief Complaint  Patient presents with  . Fall  . Hip Pain    left     HPI [name] is a 83 y.o. female.  82 yo F with a chief complaint of a fall.  Per the patient she lost her balance that she was walking from 1 room to another.  She lost her balance and fell onto her left side.  Denies head injury denies loss of consciousness.  This occurred last night.  Initially had very mild pain now has a very large bruise to that area and worsening pain.  Denies any other areas of tenderness.  Denies chest pain abdominal pain shortness of breath neck pain back pain denies head injury or loss of consciousness.   The history is provided by the patient.  Fall  This is a new problem. The current episode started yesterday. The problem occurs constantly. The problem has been gradually worsening. Pertinent negatives include no chest pain, no headaches and no shortness of breath. The symptoms are aggravated by bending and twisting. Nothing relieves the symptoms. She has tried nothing for the symptoms.  Hip Pain  Pertinent negatives include no chest pain, no headaches and no shortness of breath.    Past Medical History:  Diagnosis Date  . Acute bronchitis   . Anemia, unspecified   . Depressive disorder, not elsewhere classified   . Diverticulosis of colon (without mention of hemorrhage)   . Esophageal reflux   . Family history of malignant neoplasm of gastrointestinal tract   . Functional diarrhea   . Hiatal hernia   . Other and unspecified hyperlipidemia   . Other chest pain   . Other specified cardiac dysrhythmias(427.89)   . Personal history of colonic polyps 07/17/1995   adenomatous polyps  . PONV (postoperative nausea and vomiting)   . Rheumatoid arthritis(714.0)   . Tachycardia, unspecified   . Type II or unspecified type  diabetes mellitus with other specified manifestations, not stated as uncontrolled   . Unspecified adverse effect of unspecified drug, medicinal and biological substance   . Unspecified essential hypertension   . Unspecified hypothyroidism     Patient Active Problem List   Diagnosis Date Noted  . Diabetic ulcer of left foot associated with type 2 diabetes mellitus ([place]) 08/11/2015  . Closed right hip fracture ([place]) 08/28/2012  . Rheumatoid arthritis(714.0) 08/28/2012  . History of gastroesophageal reflux (GERD) 06/26/2011  . Diverticulosis 06/26/2011  . Lower GI bleeding 06/26/2011  . DEPRESSION 03/31/2009  . DIARRHEA, FUNCTIONAL 03/31/2009  . DIABETIC HYPOGLYCEMIA, TYPE II 11/03/2008  . ANEMIA, MILD 11/03/2008  . ACUTE BRONCHITIS 08/23/2008  . AV NODAL REENTRY TACHYCARDIA 10/29/2007  . TACHYCARDIA 10/29/2007  . CHEST PAIN, ATYPICAL 10/25/2007  . ADVEF, DRUG/MEDICINAL/BIOLOGICAL SUBST NOS 07/16/2007  . Nonspecific (abnormal) findings on radiological and other examination of body structure 04/03/2007  . CHEST XRAY, ABNORMAL 04/03/2007  . HYPOTHYROIDISM 03/06/2007  . DIABETES MELLITUS, TYPE II 03/06/2007  . HYPERLIPIDEMIA 03/06/2007  . HYPERTENSION 03/06/2007  . DIVERTICULOSIS, COLON 03/06/2007  . ARTHRITIS, RHEUMATOID, HX OF 03/06/2007    Past Surgical History:  Procedure Laterality Date  . CATARACT EXTRACTION     left  . PARTIAL HYSTERECTOMY    . THYROIDECTOMY, PARTIAL      OB History    No data available       Home Medications  Prior to Admission medications   Medication Sig Start Date End Date Taking? Authorizing Provider  alendronate (FOSAMAX) 70 MG tablet Take 70 mg by mouth every Saturday.  07/15/13   [provider]  cephALEXin (KEFLEX) 500 MG capsule Take 2 capsules (1,000 mg total) by mouth 2 (two) times daily. 07/24/17   Lawyer, Harrell Gave, PA-C  linagliptin (TRADJENTA) 5 MG TABS tablet Take 5 mg by mouth daily.    [provider]    meclizine (ANTIVERT) 25 MG tablet Take 0.5-1 tablets (12.5-25 mg total) by mouth 3 (three) times daily as needed for dizziness. 06/08/17   Ward, Delice Bison, DO  metFORMIN (GLUCOPHAGE-XR) 500 MG 24 hr tablet Take 500-1,000 mg by mouth See admin instructions. Take 2 tablets in the morning, and 1 tablet in the evening 03/07/15   [provider]  ondansetron (ZOFRAN ODT) 4 MG disintegrating tablet Take 1 tablet (4 mg total) by mouth every 8 (eight) hours as needed for nausea or vomiting. 06/08/17   Ward, Delice Bison, DO  Pitavastatin Calcium (LIVALO) 2 MG TABS Take 2 mg by mouth every Monday, Wednesday, and Friday at 6 PM.     [provider]  predniSONE (DELTASONE) 1 MG tablet Take 4 mg by mouth daily with breakfast.    [provider]  SYNTHROID 150 MCG tablet Take 150 mcg by mouth daily before breakfast.  09/30/14   [provider]  VOLTAREN 1 % GEL Apply 2 g topically 4 (four) times daily.  11/30/13   [provider]    Family History Family History  Problem Relation Age of Onset  . Colon cancer Maternal Grandmother     Social History Social History   Tobacco Use  . Smoking status: Never Smoker  . Smokeless tobacco: Never Used  Substance Use Topics  . Alcohol use: Yes    Comment: one glass of wine every Friday night.  . Drug use: No     Allergies   Patient has no known allergies.   Review of Systems Review of Systems  Constitutional: Negative for chills and fever.  HENT: Negative for congestion and rhinorrhea.   Eyes: Negative for redness and visual disturbance.  Respiratory: Negative for shortness of breath and wheezing.   Cardiovascular: Negative for chest pain and palpitations.  Gastrointestinal: Negative for nausea and vomiting.  Genitourinary: Negative for dysuria and urgency.  Musculoskeletal: Positive for arthralgias and myalgias.  Skin: Negative for pallor and wound.  Neurological: Negative for dizziness and headaches.      Physical Exam Updated Vital Signs BP 138/78 (BP Location: Right Arm)   Pulse 98   Temp 97.7 F (36.5 C) (Oral)   Resp 18   SpO2 97%   Physical Exam  Constitutional: She is oriented to person, place, and time. She appears well-developed and well-nourished. No distress.  HENT:  Head: Normocephalic and atraumatic.  Eyes: EOM are normal. Pupils are equal, round, and reactive to light.  Neck: Normal range of motion. Neck supple.  Cardiovascular: Normal rate and regular rhythm. Exam reveals no gallop and no friction rub.  No murmur heard. Pulmonary/Chest: Effort normal. She has no wheezes. She has no rales.  Abdominal: Soft. She exhibits no distension. There is no tenderness.  Musculoskeletal: She exhibits tenderness. She exhibits no edema.  Bruising noted to the left lateral aspect of the leg along the greater trochanter.  Palpated from head to toe without any other noted area of bony tenderness.  No other signs of trauma.  No pain  with internal and external rotation of the left hip.  Diffuse signs of rheumatoid arthritis  Neurological: She is alert and oriented to person, place, and time.  Skin: Skin is warm and dry. She is not diaphoretic.  Psychiatric: She has a normal mood and affect. Her behavior is normal.  Nursing note and vitals reviewed.    ED Treatments / Results  Labs (all labs ordered are listed, but only abnormal results are displayed) Labs Reviewed - No data to display  EKG  EKG Interpretation None       Radiology Dg Hip Unilat W Or Wo Pelvis 2-3 Views Left  Result Date: 08/01/2017 CLINICAL DATA:  Left hip pain after fall last evening onto carpet. EXAM: DG HIP (WITH OR WITHOUT PELVIS) 2-3V LEFT COMPARISON:  None. FINDINGS: Lumbar spondylitic disc space narrowing of the included lower lumbar spine from L3 through S1. Osteoarthritis of the SI joints with sclerosis and vacuum phenomenon. Osteoarthritis of the pubic symphysis with sclerosis. Axial joint  space narrowing of both hips. Partially included right femoral nail fixation of the right femur with adjacent heterotopic new bone formation. Atherosclerotic calcifications are noted of the femoral arteries and their branches bilaterally. No acute displaced fracture of the native left proximal femur nor hip dislocation. IMPRESSION: 1. Lumbar spondylosis. 2. 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 3. Osteoarthritis of both hips with joint space narrowing. 4. No acute fracture or malalignment noted of either hip. 5. Intact appearing right femoral nail fixation to the extent visualized. Electronically Signed   By: Ashley Royalty M.D.   On: 08/01/2017 16:04    Procedures Procedures (including critical care time)  Medications Ordered in ED Medications - No data to display   Initial Impression / Assessment and Plan / ED Course  I have reviewed the triage vital signs and the nursing notes.  Pertinent labs & imaging results that were available during my care of the patient were reviewed by me and considered in my medical decision making (see chart for details).     83 yo F with a chief complaint of left hip pain.  This occurred after a fall.  The patient has a history of difficulty with her balance.  Denies syncope.  Denies weakness.  Feel no laboratory evaluation is warranted.  The patient has no pain with movement of the hip joint.  Only pain over the skin.  X-rays negative for fracture.  Discharge home.  4:37 PM:  I have discussed the diagnosis/risks/treatment options with the patient and family and believe the pt to be eligible for discharge home to follow-up with PCP. We also discussed returning to the ED immediately if new or worsening sx occur. We discussed the sx which are most concerning (e.g., sudden worsening pain, fever, inability to tolerate by mouth) that necessitate immediate return. Medications administered to the patient during their visit and any new prescriptions provided to  the patient are listed below.  Medications given during this visit Medications - No data to display   The patient appears reasonably screen and/or stabilized for discharge and I doubt any other medical condition or other [facility] requiring further screening, evaluation, or treatment in the ED at this time prior to discharge.    Final Clinical Impressions(s) / ED Diagnoses   Final diagnoses:  Contusion of hip and thigh, left, initial encounter  Fall, initial encounter    ED Discharge Orders    None       Deno Etienne, DO 08/01/17 1637

## 2017-08-04 DIAGNOSIS — M160 Bilateral primary osteoarthritis of hip: Secondary | ICD-10-CM | POA: Diagnosis not present

## 2017-08-04 DIAGNOSIS — M4800 Spinal stenosis, site unspecified: Secondary | ICD-10-CM | POA: Diagnosis not present

## 2017-08-04 DIAGNOSIS — E119 Type 2 diabetes mellitus without complications: Secondary | ICD-10-CM | POA: Diagnosis not present

## 2017-08-04 DIAGNOSIS — F329 Major depressive disorder, single episode, unspecified: Secondary | ICD-10-CM | POA: Diagnosis not present

## 2017-08-04 DIAGNOSIS — S7001XD Contusion of right hip, subsequent encounter: Secondary | ICD-10-CM | POA: Diagnosis not present

## 2017-08-04 DIAGNOSIS — M069 Rheumatoid arthritis, unspecified: Secondary | ICD-10-CM | POA: Diagnosis not present

## 2017-08-04 DIAGNOSIS — M6281 Muscle weakness (generalized): Secondary | ICD-10-CM | POA: Diagnosis not present

## 2017-08-04 DIAGNOSIS — Z7984 Long term (current) use of oral hypoglycemic drugs: Secondary | ICD-10-CM | POA: Diagnosis not present

## 2017-08-04 DIAGNOSIS — I10 Essential (primary) hypertension: Secondary | ICD-10-CM | POA: Diagnosis not present

## 2017-08-04 DIAGNOSIS — Z9181 History of falling: Secondary | ICD-10-CM | POA: Diagnosis not present

## 2017-08-04 DIAGNOSIS — M810 Age-related osteoporosis without current pathological fracture: Secondary | ICD-10-CM | POA: Diagnosis not present

## 2017-08-04 DIAGNOSIS — F419 Anxiety disorder, unspecified: Secondary | ICD-10-CM | POA: Diagnosis not present

## 2017-08-05 DIAGNOSIS — M160 Bilateral primary osteoarthritis of hip: Secondary | ICD-10-CM | POA: Diagnosis not present

## 2017-08-05 DIAGNOSIS — M4800 Spinal stenosis, site unspecified: Secondary | ICD-10-CM | POA: Diagnosis not present

## 2017-08-05 DIAGNOSIS — M069 Rheumatoid arthritis, unspecified: Secondary | ICD-10-CM | POA: Diagnosis not present

## 2017-08-05 DIAGNOSIS — M6281 Muscle weakness (generalized): Secondary | ICD-10-CM | POA: Diagnosis not present

## 2017-08-05 DIAGNOSIS — E119 Type 2 diabetes mellitus without complications: Secondary | ICD-10-CM | POA: Diagnosis not present

## 2017-08-05 DIAGNOSIS — S7001XD Contusion of right hip, subsequent encounter: Secondary | ICD-10-CM | POA: Diagnosis not present

## 2017-08-07 DIAGNOSIS — M4800 Spinal stenosis, site unspecified: Secondary | ICD-10-CM | POA: Diagnosis not present

## 2017-08-07 DIAGNOSIS — S7001XD Contusion of right hip, subsequent encounter: Secondary | ICD-10-CM | POA: Diagnosis not present

## 2017-08-07 DIAGNOSIS — M160 Bilateral primary osteoarthritis of hip: Secondary | ICD-10-CM | POA: Diagnosis not present

## 2017-08-07 DIAGNOSIS — M069 Rheumatoid arthritis, unspecified: Secondary | ICD-10-CM | POA: Diagnosis not present

## 2017-08-07 DIAGNOSIS — M6281 Muscle weakness (generalized): Secondary | ICD-10-CM | POA: Diagnosis not present

## 2017-08-07 DIAGNOSIS — E119 Type 2 diabetes mellitus without complications: Secondary | ICD-10-CM | POA: Diagnosis not present

## 2017-08-08 DIAGNOSIS — I10 Essential (primary) hypertension: Secondary | ICD-10-CM | POA: Diagnosis not present

## 2017-08-08 DIAGNOSIS — E1149 Type 2 diabetes mellitus with other diabetic neurological complication: Secondary | ICD-10-CM | POA: Diagnosis not present

## 2017-08-08 DIAGNOSIS — M0579 Rheumatoid arthritis with rheumatoid factor of multiple sites without organ or systems involvement: Secondary | ICD-10-CM | POA: Diagnosis not present

## 2017-08-08 DIAGNOSIS — E039 Hypothyroidism, unspecified: Secondary | ICD-10-CM | POA: Diagnosis not present

## 2017-08-08 DIAGNOSIS — E119 Type 2 diabetes mellitus without complications: Secondary | ICD-10-CM | POA: Diagnosis not present

## 2017-08-08 DIAGNOSIS — E11621 Type 2 diabetes mellitus with foot ulcer: Secondary | ICD-10-CM | POA: Diagnosis not present

## 2017-08-08 DIAGNOSIS — E7800 Pure hypercholesterolemia, unspecified: Secondary | ICD-10-CM | POA: Diagnosis not present

## 2017-08-08 DIAGNOSIS — M810 Age-related osteoporosis without current pathological fracture: Secondary | ICD-10-CM | POA: Diagnosis not present

## 2017-08-09 DIAGNOSIS — M6281 Muscle weakness (generalized): Secondary | ICD-10-CM | POA: Diagnosis not present

## 2017-08-09 DIAGNOSIS — E119 Type 2 diabetes mellitus without complications: Secondary | ICD-10-CM | POA: Diagnosis not present

## 2017-08-09 DIAGNOSIS — M160 Bilateral primary osteoarthritis of hip: Secondary | ICD-10-CM | POA: Diagnosis not present

## 2017-08-09 DIAGNOSIS — M4800 Spinal stenosis, site unspecified: Secondary | ICD-10-CM | POA: Diagnosis not present

## 2017-08-09 DIAGNOSIS — M069 Rheumatoid arthritis, unspecified: Secondary | ICD-10-CM | POA: Diagnosis not present

## 2017-08-09 DIAGNOSIS — S7001XD Contusion of right hip, subsequent encounter: Secondary | ICD-10-CM | POA: Diagnosis not present

## 2017-08-10 DIAGNOSIS — M160 Bilateral primary osteoarthritis of hip: Secondary | ICD-10-CM | POA: Diagnosis not present

## 2017-08-10 DIAGNOSIS — M6281 Muscle weakness (generalized): Secondary | ICD-10-CM | POA: Diagnosis not present

## 2017-08-10 DIAGNOSIS — S7001XD Contusion of right hip, subsequent encounter: Secondary | ICD-10-CM | POA: Diagnosis not present

## 2017-08-10 DIAGNOSIS — E119 Type 2 diabetes mellitus without complications: Secondary | ICD-10-CM | POA: Diagnosis not present

## 2017-08-10 DIAGNOSIS — M4800 Spinal stenosis, site unspecified: Secondary | ICD-10-CM | POA: Diagnosis not present

## 2017-08-10 DIAGNOSIS — M069 Rheumatoid arthritis, unspecified: Secondary | ICD-10-CM | POA: Diagnosis not present

## 2017-08-12 DIAGNOSIS — N390 Urinary tract infection, site not specified: Secondary | ICD-10-CM | POA: Diagnosis not present

## 2017-08-12 DIAGNOSIS — R358 Other polyuria: Secondary | ICD-10-CM | POA: Diagnosis not present

## 2017-08-13 DIAGNOSIS — M069 Rheumatoid arthritis, unspecified: Secondary | ICD-10-CM | POA: Diagnosis not present

## 2017-08-13 DIAGNOSIS — S7001XD Contusion of right hip, subsequent encounter: Secondary | ICD-10-CM | POA: Diagnosis not present

## 2017-08-13 DIAGNOSIS — M4800 Spinal stenosis, site unspecified: Secondary | ICD-10-CM | POA: Diagnosis not present

## 2017-08-13 DIAGNOSIS — M160 Bilateral primary osteoarthritis of hip: Secondary | ICD-10-CM | POA: Diagnosis not present

## 2017-08-13 DIAGNOSIS — M6281 Muscle weakness (generalized): Secondary | ICD-10-CM | POA: Diagnosis not present

## 2017-08-13 DIAGNOSIS — E119 Type 2 diabetes mellitus without complications: Secondary | ICD-10-CM | POA: Diagnosis not present

## 2017-08-19 DIAGNOSIS — M160 Bilateral primary osteoarthritis of hip: Secondary | ICD-10-CM | POA: Diagnosis not present

## 2017-08-19 DIAGNOSIS — S7001XD Contusion of right hip, subsequent encounter: Secondary | ICD-10-CM | POA: Diagnosis not present

## 2017-08-19 DIAGNOSIS — M069 Rheumatoid arthritis, unspecified: Secondary | ICD-10-CM | POA: Diagnosis not present

## 2017-08-19 DIAGNOSIS — M6281 Muscle weakness (generalized): Secondary | ICD-10-CM | POA: Diagnosis not present

## 2017-08-19 DIAGNOSIS — M4800 Spinal stenosis, site unspecified: Secondary | ICD-10-CM | POA: Diagnosis not present

## 2017-08-19 DIAGNOSIS — E119 Type 2 diabetes mellitus without complications: Secondary | ICD-10-CM | POA: Diagnosis not present

## 2017-08-20 DIAGNOSIS — R6883 Chills (without fever): Secondary | ICD-10-CM | POA: Diagnosis not present

## 2017-08-20 DIAGNOSIS — N390 Urinary tract infection, site not specified: Secondary | ICD-10-CM | POA: Diagnosis not present

## 2017-08-21 DIAGNOSIS — M6281 Muscle weakness (generalized): Secondary | ICD-10-CM | POA: Diagnosis not present

## 2017-08-21 DIAGNOSIS — E119 Type 2 diabetes mellitus without complications: Secondary | ICD-10-CM | POA: Diagnosis not present

## 2017-08-21 DIAGNOSIS — M4800 Spinal stenosis, site unspecified: Secondary | ICD-10-CM | POA: Diagnosis not present

## 2017-08-21 DIAGNOSIS — M069 Rheumatoid arthritis, unspecified: Secondary | ICD-10-CM | POA: Diagnosis not present

## 2017-08-21 DIAGNOSIS — M160 Bilateral primary osteoarthritis of hip: Secondary | ICD-10-CM | POA: Diagnosis not present

## 2017-08-21 DIAGNOSIS — S7001XD Contusion of right hip, subsequent encounter: Secondary | ICD-10-CM | POA: Diagnosis not present

## 2017-08-22 DIAGNOSIS — M6281 Muscle weakness (generalized): Secondary | ICD-10-CM | POA: Diagnosis not present

## 2017-08-22 DIAGNOSIS — M069 Rheumatoid arthritis, unspecified: Secondary | ICD-10-CM | POA: Diagnosis not present

## 2017-08-22 DIAGNOSIS — S7001XD Contusion of right hip, subsequent encounter: Secondary | ICD-10-CM | POA: Diagnosis not present

## 2017-08-22 DIAGNOSIS — M160 Bilateral primary osteoarthritis of hip: Secondary | ICD-10-CM | POA: Diagnosis not present

## 2017-08-22 DIAGNOSIS — M4800 Spinal stenosis, site unspecified: Secondary | ICD-10-CM | POA: Diagnosis not present

## 2017-08-22 DIAGNOSIS — E119 Type 2 diabetes mellitus without complications: Secondary | ICD-10-CM | POA: Diagnosis not present

## 2017-08-23 DIAGNOSIS — S71012A Laceration without foreign body, left hip, initial encounter: Secondary | ICD-10-CM | POA: Diagnosis not present

## 2017-08-23 DIAGNOSIS — W19XXXA Unspecified fall, initial encounter: Secondary | ICD-10-CM | POA: Diagnosis not present

## 2017-08-26 DIAGNOSIS — M6281 Muscle weakness (generalized): Secondary | ICD-10-CM | POA: Diagnosis not present

## 2017-08-26 DIAGNOSIS — E119 Type 2 diabetes mellitus without complications: Secondary | ICD-10-CM | POA: Diagnosis not present

## 2017-08-26 DIAGNOSIS — M069 Rheumatoid arthritis, unspecified: Secondary | ICD-10-CM | POA: Diagnosis not present

## 2017-08-26 DIAGNOSIS — M160 Bilateral primary osteoarthritis of hip: Secondary | ICD-10-CM | POA: Diagnosis not present

## 2017-08-26 DIAGNOSIS — M4800 Spinal stenosis, site unspecified: Secondary | ICD-10-CM | POA: Diagnosis not present

## 2017-08-26 DIAGNOSIS — S7001XD Contusion of right hip, subsequent encounter: Secondary | ICD-10-CM | POA: Diagnosis not present

## 2017-08-28 DIAGNOSIS — M4800 Spinal stenosis, site unspecified: Secondary | ICD-10-CM | POA: Diagnosis not present

## 2017-08-28 DIAGNOSIS — S7001XD Contusion of right hip, subsequent encounter: Secondary | ICD-10-CM | POA: Diagnosis not present

## 2017-08-28 DIAGNOSIS — M6281 Muscle weakness (generalized): Secondary | ICD-10-CM | POA: Diagnosis not present

## 2017-08-28 DIAGNOSIS — E119 Type 2 diabetes mellitus without complications: Secondary | ICD-10-CM | POA: Diagnosis not present

## 2017-08-28 DIAGNOSIS — M069 Rheumatoid arthritis, unspecified: Secondary | ICD-10-CM | POA: Diagnosis not present

## 2017-08-28 DIAGNOSIS — M160 Bilateral primary osteoarthritis of hip: Secondary | ICD-10-CM | POA: Diagnosis not present

## 2017-08-29 DIAGNOSIS — E1149 Type 2 diabetes mellitus with other diabetic neurological complication: Secondary | ICD-10-CM | POA: Diagnosis not present

## 2017-08-29 DIAGNOSIS — M0579 Rheumatoid arthritis with rheumatoid factor of multiple sites without organ or systems involvement: Secondary | ICD-10-CM | POA: Diagnosis not present

## 2017-08-29 DIAGNOSIS — I10 Essential (primary) hypertension: Secondary | ICD-10-CM | POA: Diagnosis not present

## 2017-08-29 DIAGNOSIS — E119 Type 2 diabetes mellitus without complications: Secondary | ICD-10-CM | POA: Diagnosis not present

## 2017-08-29 DIAGNOSIS — E11621 Type 2 diabetes mellitus with foot ulcer: Secondary | ICD-10-CM | POA: Diagnosis not present

## 2017-08-29 DIAGNOSIS — E039 Hypothyroidism, unspecified: Secondary | ICD-10-CM | POA: Diagnosis not present

## 2017-08-29 DIAGNOSIS — E7800 Pure hypercholesterolemia, unspecified: Secondary | ICD-10-CM | POA: Diagnosis not present

## 2017-08-29 DIAGNOSIS — M810 Age-related osteoporosis without current pathological fracture: Secondary | ICD-10-CM | POA: Diagnosis not present

## 2017-08-30 ENCOUNTER — Encounter: Payer: Self-pay | Admitting: Podiatry

## 2017-08-30 ENCOUNTER — Ambulatory Visit (INDEPENDENT_AMBULATORY_CARE_PROVIDER_SITE_OTHER): Payer: Medicare Other | Admitting: Podiatry

## 2017-08-30 DIAGNOSIS — L84 Corns and callosities: Secondary | ICD-10-CM | POA: Diagnosis not present

## 2017-08-30 DIAGNOSIS — L97901 Non-pressure chronic ulcer of unspecified part of unspecified lower leg limited to breakdown of skin: Secondary | ICD-10-CM

## 2017-08-30 DIAGNOSIS — S81811A Laceration without foreign body, right lower leg, initial encounter: Secondary | ICD-10-CM | POA: Diagnosis not present

## 2017-08-30 DIAGNOSIS — B351 Tinea unguium: Secondary | ICD-10-CM | POA: Diagnosis not present

## 2017-08-30 DIAGNOSIS — M79676 Pain in unspecified toe(s): Secondary | ICD-10-CM

## 2017-08-30 DIAGNOSIS — E1140 Type 2 diabetes mellitus with diabetic neuropathy, unspecified: Secondary | ICD-10-CM

## 2017-09-04 NOTE — Progress Notes (Signed)
Subjective: 83 y.o. returns the office today for painful, elongated, thickened toenails which she she states that the wound on her left foot submetatarsal one has been doing well that she has noticed.  She denies any new ulcerations cannot trim themself. Denies any redness or drainage around the nails..  Since I last saw her she has a full-time caregiver and she has been in a wheelchair.  She denies any increase in swelling.  She did hit her right foot on the wheelchair she had a superficial abrasion to the area which she saw her primary care physician for and she denies any redness or drainage or pus to the area.  Denies any acute changes since last appointment and no new complaints today. Denies any systemic complaints such as fevers, chills, nausea, vomiting.   PCP: Deland Pretty, MD  Objective: AAO 3, NAD; presents today in a wheelchair with her caregiver DP/PT pulses palpable, CRT less than 3 seconds Protective sensation decreased with Simms Weinstein monofilament Nails hypertrophic, dystrophic, elongated, brittle, discolored 10. There is tenderness overlying the nails 1-5 bilaterally. There is no surrounding erythema or drainage along the nail sites. Significant HAV is present as well as digital deformities.  There is atrophy of the fat pad of the foot.  The area of the previous wound submetatarsal one on the left foot is completely healed at this time there is minimal hyperkeratotic tissue.  However upon evaluation on the medial aspect of the foot on the first metatarsal head on the bunion site is a superficial pre-ulcerative area there is no drainage or pus to the area.  There is no erythema or ascending cellulitis or any clinical signs of infection.  Superficial abrasion right leg but there is no drainage or pus or any signs of infection.  There is no other open lesions or pre-ulcerative lesions identified today. No other areas of tenderness bilateral lower extremities. No overlying edema,  erythema, increased warmth. No pain with calf compression, swelling, warmth, erythema.  Assessment: Patient presents with symptomatic onychomycosis; pre-ulcerative lesion left foot  Plan: -Treatment options including alternatives, risks, complications were discussed -Nails sharply debrided 10 without complication/bleeding. -Area left foot on the mornings that is pre-ulcerative.  Discussed offloading pads as well as a change in shoes. -The wound to the right leg appears to be noninfected and doing well.  Antibiotic ointment and a bandage daily.  If either of these areas are not healed in the next 1-2 weeks to call the office or sooner if any issues are to arise.  Monitor for any signs or symptoms of infection. -Otherwise I will see her back in 3 months or sooner if any issues are to arise. -Discussed daily foot inspection. If there are any changes, to call the office immediately.   Celesta Gentile, DPM

## 2017-09-06 DIAGNOSIS — S81801S Unspecified open wound, right lower leg, sequela: Secondary | ICD-10-CM | POA: Diagnosis not present

## 2017-09-09 DIAGNOSIS — S7001XD Contusion of right hip, subsequent encounter: Secondary | ICD-10-CM | POA: Diagnosis not present

## 2017-09-09 DIAGNOSIS — M160 Bilateral primary osteoarthritis of hip: Secondary | ICD-10-CM | POA: Diagnosis not present

## 2017-09-09 DIAGNOSIS — E119 Type 2 diabetes mellitus without complications: Secondary | ICD-10-CM | POA: Diagnosis not present

## 2017-09-09 DIAGNOSIS — M069 Rheumatoid arthritis, unspecified: Secondary | ICD-10-CM | POA: Diagnosis not present

## 2017-09-09 DIAGNOSIS — M4800 Spinal stenosis, site unspecified: Secondary | ICD-10-CM | POA: Diagnosis not present

## 2017-09-09 DIAGNOSIS — M6281 Muscle weakness (generalized): Secondary | ICD-10-CM | POA: Diagnosis not present

## 2017-09-10 ENCOUNTER — Encounter (HOSPITAL_COMMUNITY): Payer: Self-pay

## 2017-09-10 ENCOUNTER — Inpatient Hospital Stay (HOSPITAL_COMMUNITY)
Admission: EM | Admit: 2017-09-10 | Discharge: 2017-09-13 | DRG: 377 | Disposition: A | Discharge status: Home Health | Payer: Medicare Other | Attending: Internal Medicine | Admitting: Internal Medicine

## 2017-09-10 ENCOUNTER — Other Ambulatory Visit: Payer: Self-pay

## 2017-09-10 DIAGNOSIS — K5731 Diverticulosis of large intestine without perforation or abscess with bleeding: Secondary | ICD-10-CM | POA: Diagnosis present

## 2017-09-10 DIAGNOSIS — Z79899 Other long term (current) drug therapy: Secondary | ICD-10-CM | POA: Diagnosis not present

## 2017-09-10 DIAGNOSIS — E43 Unspecified severe protein-calorie malnutrition: Secondary | ICD-10-CM | POA: Diagnosis present

## 2017-09-10 DIAGNOSIS — Z90710 Acquired absence of both cervix and uterus: Secondary | ICD-10-CM | POA: Diagnosis not present

## 2017-09-10 DIAGNOSIS — Z7952 Long term (current) use of systemic steroids: Secondary | ICD-10-CM | POA: Diagnosis not present

## 2017-09-10 DIAGNOSIS — Z800 Family history of malignant neoplasm of digestive organs: Secondary | ICD-10-CM

## 2017-09-10 DIAGNOSIS — M05742 Rheumatoid arthritis with rheumatoid factor of left hand without organ or systems involvement: Secondary | ICD-10-CM

## 2017-09-10 DIAGNOSIS — M7989 Other specified soft tissue disorders: Secondary | ICD-10-CM | POA: Diagnosis present

## 2017-09-10 DIAGNOSIS — E785 Hyperlipidemia, unspecified: Secondary | ICD-10-CM | POA: Diagnosis present

## 2017-09-10 DIAGNOSIS — Z681 Body mass index (BMI) 19 or less, adult: Secondary | ICD-10-CM | POA: Diagnosis not present

## 2017-09-10 DIAGNOSIS — D62 Acute posthemorrhagic anemia: Secondary | ICD-10-CM | POA: Diagnosis not present

## 2017-09-10 DIAGNOSIS — M069 Rheumatoid arthritis, unspecified: Secondary | ICD-10-CM | POA: Diagnosis present

## 2017-09-10 DIAGNOSIS — I10 Essential (primary) hypertension: Secondary | ICD-10-CM | POA: Diagnosis not present

## 2017-09-10 DIAGNOSIS — E11621 Type 2 diabetes mellitus with foot ulcer: Secondary | ICD-10-CM | POA: Diagnosis not present

## 2017-09-10 DIAGNOSIS — E1169 Type 2 diabetes mellitus with other specified complication: Secondary | ICD-10-CM | POA: Diagnosis present

## 2017-09-10 DIAGNOSIS — K5790 Diverticulosis of intestine, part unspecified, without perforation or abscess without bleeding: Secondary | ICD-10-CM | POA: Diagnosis not present

## 2017-09-10 DIAGNOSIS — Z8719 Personal history of other diseases of the digestive system: Secondary | ICD-10-CM | POA: Diagnosis not present

## 2017-09-10 DIAGNOSIS — M6281 Muscle weakness (generalized): Secondary | ICD-10-CM | POA: Diagnosis not present

## 2017-09-10 DIAGNOSIS — E039 Hypothyroidism, unspecified: Secondary | ICD-10-CM | POA: Diagnosis not present

## 2017-09-10 DIAGNOSIS — K219 Gastro-esophageal reflux disease without esophagitis: Secondary | ICD-10-CM | POA: Diagnosis present

## 2017-09-10 DIAGNOSIS — D500 Iron deficiency anemia secondary to blood loss (chronic): Secondary | ICD-10-CM | POA: Diagnosis not present

## 2017-09-10 DIAGNOSIS — S7001XD Contusion of right hip, subsequent encounter: Secondary | ICD-10-CM | POA: Diagnosis not present

## 2017-09-10 DIAGNOSIS — M160 Bilateral primary osteoarthritis of hip: Secondary | ICD-10-CM | POA: Diagnosis not present

## 2017-09-10 DIAGNOSIS — Z79890 Hormone replacement therapy (postmenopausal): Secondary | ICD-10-CM

## 2017-09-10 DIAGNOSIS — M05741 Rheumatoid arthritis with rheumatoid factor of right hand without organ or systems involvement: Secondary | ICD-10-CM | POA: Diagnosis present

## 2017-09-10 DIAGNOSIS — K625 Hemorrhage of anus and rectum: Secondary | ICD-10-CM | POA: Diagnosis not present

## 2017-09-10 DIAGNOSIS — K5791 Diverticulosis of intestine, part unspecified, without perforation or abscess with bleeding: Secondary | ICD-10-CM | POA: Diagnosis not present

## 2017-09-10 DIAGNOSIS — Z7984 Long term (current) use of oral hypoglycemic drugs: Secondary | ICD-10-CM

## 2017-09-10 DIAGNOSIS — K922 Gastrointestinal hemorrhage, unspecified: Secondary | ICD-10-CM

## 2017-09-10 DIAGNOSIS — K921 Melena: Secondary | ICD-10-CM | POA: Diagnosis not present

## 2017-09-10 LAB — COMPREHENSIVE METABOLIC PANEL
ALT: 13 U/L — ABNORMAL LOW (ref 14–54)
ANION GAP: 8 (ref 5–15)
AST: 14 U/L — ABNORMAL LOW (ref 15–41)
Albumin: 3.1 g/dL — ABNORMAL LOW (ref 3.5–5.0)
Alkaline Phosphatase: 74 U/L (ref 38–126)
BUN: 15 mg/dL (ref 6–20)
CHLORIDE: 106 mmol/L (ref 101–111)
CO2: 26 mmol/L (ref 22–32)
CREATININE: 0.48 mg/dL (ref 0.44–1.00)
Calcium: 8.1 mg/dL — ABNORMAL LOW (ref 8.9–10.3)
Glucose, Bld: 256 mg/dL — ABNORMAL HIGH (ref 65–99)
POTASSIUM: 3.5 mmol/L (ref 3.5–5.1)
Sodium: 140 mmol/L (ref 135–145)
Total Bilirubin: 0.7 mg/dL (ref 0.3–1.2)
Total Protein: 6 g/dL — ABNORMAL LOW (ref 6.5–8.1)

## 2017-09-10 LAB — CBC
HEMATOCRIT: 30.1 % — AB (ref 36.0–46.0)
Hemoglobin: 9.5 g/dL — ABNORMAL LOW (ref 12.0–15.0)
MCH: 27.7 pg (ref 26.0–34.0)
MCHC: 31.6 g/dL (ref 30.0–36.0)
MCV: 87.8 fL (ref 78.0–100.0)
PLATELETS: 296 10*3/uL (ref 150–400)
RBC: 3.43 MIL/uL — AB (ref 3.87–5.11)
RDW: 15.4 % (ref 11.5–15.5)
WBC: 7.2 10*3/uL (ref 4.0–10.5)

## 2017-09-10 LAB — SAMPLE TO BLOOD BANK

## 2017-09-10 LAB — CBG MONITORING, ED
Glucose-Capillary: 163 mg/dL — ABNORMAL HIGH (ref 65–99)
Glucose-Capillary: 160 mg/dL — ABNORMAL HIGH (ref 65–99)

## 2017-09-10 LAB — POC OCCULT BLOOD, ED: Fecal Occult Bld: POSITIVE — AB

## 2017-09-10 MED ORDER — DICLOFENAC SODIUM 1 % TD GEL
2.0000 g | Freq: Four times a day (QID) | TRANSDERMAL | Status: DC
  Administered 2017-09-10 – 2017-09-13 (×7): 2 g via TOPICAL
  Filled 2017-09-10 (×2): qty 100

## 2017-09-10 MED ORDER — ACETAMINOPHEN 325 MG PO TABS: 650.0000 mg | ORAL_TABLET | Freq: Four times a day (QID) | ORAL | Status: DC | PRN

## 2017-09-10 MED ORDER — PREDNISONE 1 MG PO TABS
4.0000 mg | ORAL_TABLET | Freq: Every day | ORAL | Status: DC
  Administered 2017-09-11 – 2017-09-13 (×3): 4 mg via ORAL
  Filled 2017-09-10 (×4): qty 4

## 2017-09-10 MED ORDER — ACETAMINOPHEN 650 MG RE SUPP: 650.0000 mg | Freq: Four times a day (QID) | RECTAL | Status: DC | PRN

## 2017-09-10 MED ORDER — INSULIN ASPART 100 UNIT/ML ~~LOC~~ SOLN
0.0000 [IU] | SUBCUTANEOUS | Status: DC
  Administered 2017-09-10 (×2): 2 [IU] via SUBCUTANEOUS
  Administered 2017-09-11 (×2): 1 [IU] via SUBCUTANEOUS
  Administered 2017-09-12: 2 [IU] via SUBCUTANEOUS
  Administered 2017-09-12: 3 [IU] via SUBCUTANEOUS
  Administered 2017-09-13: 1 [IU] via SUBCUTANEOUS
  Filled 2017-09-10 (×3): qty 1

## 2017-09-10 MED ORDER — LEVOTHYROXINE SODIUM 75 MCG PO TABS
150.0000 ug | ORAL_TABLET | Freq: Every day | ORAL | Status: DC
  Administered 2017-09-11 – 2017-09-13 (×3): 150 ug via ORAL
  Filled 2017-09-10 (×2): qty 2
  Filled 2017-09-10 (×2): qty 1

## 2017-09-10 NOTE — ED Notes (Signed)
Type and screen save tube sent to lab per phlebotomy

## 2017-09-10 NOTE — ED Notes (Signed)
Orthostatics further delayed once pt in room. EDP at bedside.

## 2017-09-10 NOTE — ED Triage Notes (Signed)
Pt escorted with CG from [facility]  [phone].  Upon pt Discharge CG stated to call [facility], and pt has CG that comes in at 6pm.

## 2017-09-10 NOTE — ED Notes (Signed)
Patient requesting voltaren cream which she uses at home, night coverage paged. No new orders.

## 2017-09-10 NOTE — ED Provider Notes (Addendum)
[facility] DEPT Provider Note   CSN: [id number] Arrival date & time: 09/10/17  1305     History   Chief Complaint Chief Complaint  Patient presents with  . Rectal Bleeding    HPI [name] is a 83 y.o. female.  HPI  Patient is a 83 year old female with history of rheumatoid arthritis,  who is diverticulosis, diabetes, hypertension, hypothyroidism who presents to the emergency department with bloody stools.  Patient reports that she woke up this morning and had 2 bowel movements with a large amount of blood and clots in it.  Patient denies any associated abdominal pain, dizziness, lightheadedness, chest pain, shortness of breath.  Patient has history of diverticulosis, but reports that she has not had any bloody stools in several years.  Patient is not on blood thinners or aspirin.   Past Medical History:  Diagnosis Date  . Acute bronchitis   . Anemia, unspecified   . Depressive disorder, not elsewhere classified   . Diverticulosis of colon (without mention of hemorrhage)   . Esophageal reflux   . Family history of malignant neoplasm of gastrointestinal tract   . Functional diarrhea   . Hiatal hernia   . Other and unspecified hyperlipidemia   . Other chest pain   . Other specified cardiac dysrhythmias(427.89)   . Personal history of colonic polyps 07/17/1995   adenomatous polyps  . PONV (postoperative nausea and vomiting)   . Rheumatoid arthritis(714.0)   . Tachycardia, unspecified   . Type II or unspecified type diabetes mellitus with other specified manifestations, not stated as uncontrolled   . Unspecified adverse effect of unspecified drug, medicinal and biological substance   . Unspecified essential hypertension   . Unspecified hypothyroidism     Patient Active Problem List   Diagnosis Date Noted  . Lower GI bleed 09/10/2017  . Diabetic ulcer of left foot associated with type 2 diabetes mellitus ([place]) 08/11/2015  .  Closed right hip fracture ([place]) 08/28/2012  . Rheumatoid arthritis(714.0) 08/28/2012  . History of gastroesophageal reflux (GERD) 06/26/2011  . Diverticulosis 06/26/2011  . Lower GI bleeding 06/26/2011  . DEPRESSION 03/31/2009  . DIARRHEA, FUNCTIONAL 03/31/2009  . DIABETIC HYPOGLYCEMIA, TYPE II 11/03/2008  . ANEMIA, MILD 11/03/2008  . ACUTE BRONCHITIS 08/23/2008  . AV NODAL REENTRY TACHYCARDIA 10/29/2007  . TACHYCARDIA 10/29/2007  . CHEST PAIN, ATYPICAL 10/25/2007  . ADVEF, DRUG/MEDICINAL/BIOLOGICAL SUBST NOS 07/16/2007  . Nonspecific (abnormal) findings on radiological and other examination of body structure 04/03/2007  . CHEST XRAY, ABNORMAL 04/03/2007  . Hypothyroidism 03/06/2007  . Type 2 diabetes mellitus with other specified complication ([place]) 67/20/9470  . HYPERLIPIDEMIA 03/06/2007  . Essential hypertension 03/06/2007  . DIVERTICULOSIS, COLON 03/06/2007  . Rheumatoid arthritis ([place]) 03/06/2007    Past Surgical History:  Procedure Laterality Date  . CATARACT EXTRACTION     left  . ORIF FEMUR FRACTURE  08/30/2012   Procedure: OPEN REDUCTION INTERNAL FIXATION (ORIF) DISTAL FEMUR FRACTURE;  Surgeon: Mauri Pole, MD;  Location: [facility] ORS;  Service: Orthopedics;  Laterality: Right;  proximal femur  . PARTIAL HYSTERECTOMY    . THYROIDECTOMY, PARTIAL      OB History    No data available       Home Medications    Prior to Admission medications   Medication Sig Start Date End Date Taking? Authorizing Provider  folic acid (FOLVITE) 1 MG tablet Take 1 mg by mouth daily.   Yes [provider]  ibuprofen (ADVIL,MOTRIN) 200 MG tablet Take  400 mg by mouth 2 (two) times daily as needed.   Yes [provider]  leflunomide (ARAVA) 20 MG tablet Take 20 mg by mouth daily. 08/14/17  Yes [provider]  linagliptin (TRADJENTA) 5 MG TABS tablet Take 5 mg by mouth daily.   Yes [provider]  metFORMIN (GLUCOPHAGE-XR) 500 MG 24 hr tablet Take  500-1,000 mg by mouth See admin instructions. Take 2 tablets in the morning, and 1 tablet in the evening 03/07/15  Yes [provider]  omeprazole (PRILOSEC) 40 MG capsule Take 40 mg by mouth daily. 08/14/17  Yes [provider]  Pitavastatin Calcium (LIVALO) 2 MG TABS Take 2 mg by mouth every Monday, Wednesday, and Friday at 6 PM.    Yes [provider]  predniSONE (DELTASONE) 1 MG tablet Take 4 mg by mouth daily with breakfast.   Yes [provider]  SYNTHROID 150 MCG tablet Take 150 mcg by mouth daily before breakfast.  09/30/14  Yes [provider]  TRULICITY 3.50 KX/3.8HW SOPN Inject 0.75 mg into the skin once a week. 08/28/17  Yes [provider]  VOLTAREN 1 % GEL Apply 2 g topically 4 (four) times daily.  11/30/13  Yes [provider]  alendronate (FOSAMAX) 70 MG tablet Take 70 mg by mouth every Saturday.  07/15/13   [provider]  cephALEXin (KEFLEX) 500 MG capsule Take 2 capsules (1,000 mg total) by mouth 2 (two) times daily. Patient not taking: Reported on 09/10/2017 07/24/17   Dalia Heading, PA-C  meclizine (ANTIVERT) 25 MG tablet Take 0.5-1 tablets (12.5-25 mg total) by mouth 3 (three) times daily as needed for dizziness. Patient not taking: Reported on 09/10/2017 06/08/17   Ward, Delice Bison, DO  ondansetron (ZOFRAN ODT) 4 MG disintegrating tablet Take 1 tablet (4 mg total) by mouth every 8 (eight) hours as needed for nausea or vomiting. Patient not taking: Reported on 09/10/2017 06/08/17   Ward, Delice Bison, DO    Family History Family History  Problem Relation Age of Onset  . Colon cancer Maternal Grandmother     Social History Social History   Tobacco Use  . Smoking status: Never Smoker  . Smokeless tobacco: Never Used  Substance Use Topics  . Alcohol use: Yes    Comment: one glass of wine every Friday night.  . Drug use: No     Allergies   Patient has no known allergies.   Review of  Systems Review of Systems  Respiratory: Negative for shortness of breath.   Gastrointestinal: Positive for anal bleeding. Negative for abdominal pain.  Neurological: Negative for dizziness.  Hematological: Does not bruise/bleed easily.  All other systems reviewed and are negative.    Physical Exam Updated Vital Signs BP 132/66 (BP Location: Left Arm)   Pulse 93   Temp 97.9 F (36.6 C) (Oral)   Resp 16   Ht 5\' 3"  (1.6 m)   Wt 45.4 kg (100 lb)   SpO2 99%   BMI 17.71 kg/m   Physical Exam  Constitutional: She is oriented to person, place, and time. She appears well-developed.  HENT:  Head: Normocephalic and atraumatic.  Eyes: EOM are normal.  Neck: Normal range of motion. Neck supple.  Cardiovascular: Normal rate.  Pulmonary/Chest: Effort normal.  Abdominal: Soft. Bowel sounds are normal.  Dark blood with clots on DRE  Neurological: She is alert and oriented to person, place, and time.  Skin: Skin is warm and dry.  Nursing note and vitals reviewed.  ED Treatments / Results  Labs (all labs ordered are listed, but only abnormal results are displayed) Labs Reviewed  COMPREHENSIVE METABOLIC PANEL - Abnormal; Notable for the following components:      Result Value   Glucose, Bld 256 (*)    Calcium 8.1 (*)    Total Protein 6.0 (*)    Albumin 3.1 (*)    AST 14 (*)    ALT 13 (*)    All other components within normal limits  CBC - Abnormal; Notable for the following components:   RBC 3.43 (*)    Hemoglobin 9.5 (*)    HCT 30.1 (*)    All other components within normal limits  POC OCCULT BLOOD, ED - Abnormal; Notable for the following components:   Fecal Occult Bld POSITIVE (*)    All other components within normal limits  POC OCCULT BLOOD, ED  SAMPLE TO BLOOD BANK    EKG  EKG Interpretation None       Radiology No results found.  Procedures Procedures (including critical care time)  Medications Ordered in ED Medications  insulin aspart (novoLOG)  injection 0-9 Units (not administered)     Initial Impression / Assessment and Plan / ED Course  I have reviewed the triage vital signs and the nursing notes.  Pertinent labs & imaging results that were available during my care of the patient were reviewed by me and considered in my medical decision making (see chart for details).  Clinical Course as of Sep 10 1733  Tue Sep 10, 2017  1641 Dr. Genevive Bi from GI will see the patient. Clear liquid diet recommended for now.  [AN]    Clinical Course User Index [AN] Varney Biles, MD   DDx includes:  Diverticular bleed Colon cancer Rectal bleed Internal hemorrhoids External hemorrhoids  Patient with known history of diverticulosis comes in with chief complaint of lower GI bleed.  Patient is not having any symptoms related to acute anemia.  Patient is not having abdominal pain either.  Hemoglobin today is 9.5, dropped from 13 just from few months ago.. We will admit patient to the hospital. Patient used to see lab our GI, has not seen any GI doctor in several years therefore she will be paged out as unassigned.   Final Clinical Impressions(s) / ED Diagnoses   Final diagnoses:  Acute GI bleeding  Lower GI bleed    ED Discharge Orders    None       Varney Biles, MD 09/10/17 Gideon, [place], MD 09/10/17 1734

## 2017-09-10 NOTE — ED Notes (Signed)
Hospitalist at bedside 

## 2017-09-10 NOTE — ED Notes (Signed)
This RN attempted to insert peripheral IV x2 unsuccessfully. Will ask another RN to attempt.

## 2017-09-10 NOTE — H&P (Addendum)
History and Physical    [name]:[id number] DOB: 1929-02-28 DOA: 09/10/2017  Referring MD/NP/PA: EDP PCP:  Patient coming from:   Chief Complaint: Bleeding  HPI: [name] is a 83 y.o. female with medical history significant of diverticulosis, rheumatoid arthritis, osteoarthritis, severe protein calorie malnutrition presents from home with painless lower GI bleeding which started yesterday around noon, progressively worsened to multiple episodes with large clots today and hence presented to the emergency room. She denies any fevers or chills. Not on any anticoagulants or antiplatelet agents no history of alcohol or NSAID use ED Course: Hemoglobin is 9 down from 13 a month ago Review of Systems: As per HPI otherwise 14 point review of systems negative.   Past Medical History:  Diagnosis Date  . Acute bronchitis   . Anemia, unspecified   . Depressive disorder, not elsewhere classified   . Diverticulosis of colon (without mention of hemorrhage)   . Esophageal reflux   . Family history of malignant neoplasm of gastrointestinal tract   . Functional diarrhea   . Hiatal hernia   . Other and unspecified hyperlipidemia   . Other chest pain   . Other specified cardiac dysrhythmias(427.89)   . Personal history of colonic polyps 07/17/1995   adenomatous polyps  . PONV (postoperative nausea and vomiting)   . Rheumatoid arthritis(714.0)   . Tachycardia, unspecified   . Type II or unspecified type diabetes mellitus with other specified manifestations, not stated as uncontrolled   . Unspecified adverse effect of unspecified drug, medicinal and biological substance   . Unspecified essential hypertension   . Unspecified hypothyroidism     Past Surgical History:  Procedure Laterality Date  . CATARACT EXTRACTION     left  . ORIF FEMUR FRACTURE  08/30/2012   Procedure: OPEN REDUCTION INTERNAL FIXATION (ORIF) DISTAL FEMUR FRACTURE;  Surgeon: Mauri Pole, MD;   Location: [facility];  Service: Orthopedics;  Laterality: Right;  proximal femur  . PARTIAL HYSTERECTOMY    . THYROIDECTOMY, PARTIAL       reports that  has never smoked. she has never used smokeless tobacco. She reports that she drinks alcohol. She reports that she does not use drugs.  No Known Allergies  Family History  Problem Relation Age of Onset  . Colon cancer Maternal Grandmother      Prior to Admission medications   Medication Sig Start Date End Date Taking? Authorizing Provider  alendronate (FOSAMAX) 70 MG tablet Take 70 mg by mouth every Saturday.  07/15/13   [provider]  cephALEXin (KEFLEX) 500 MG capsule Take 2 capsules (1,000 mg total) by mouth 2 (two) times daily. 07/24/17   Lawyer, Harrell Gave, PA-C  linagliptin (TRADJENTA) 5 MG TABS tablet Take 5 mg by mouth daily.    [provider]  meclizine (ANTIVERT) 25 MG tablet Take 0.5-1 tablets (12.5-25 mg total) by mouth 3 (three) times daily as needed for dizziness. 06/08/17   Ward, Delice Bison, DO  metFORMIN (GLUCOPHAGE-XR) 500 MG 24 hr tablet Take 500-1,000 mg by mouth See admin instructions. Take 2 tablets in the morning, and 1 tablet in the evening 03/07/15   [provider]  ondansetron (ZOFRAN ODT) 4 MG disintegrating tablet Take 1 tablet (4 mg total) by mouth every 8 (eight) hours as needed for nausea or vomiting. 06/08/17   Ward, Delice Bison, DO  Pitavastatin Calcium (LIVALO) 2 MG TABS Take 2 mg by mouth every Monday, Wednesday, and Friday at 6 PM.     [provider]  predniSONE (DELTASONE) 1 MG tablet Take 4 mg by mouth daily with breakfast.    [provider]  SYNTHROID 150 MCG tablet Take 150 mcg by mouth daily before breakfast.  09/30/14   [provider]  VOLTAREN 1 % GEL Apply 2 g topically 4 (four) times daily.  11/30/13   [provider]    Physical Exam: Vitals:   09/10/17 1329 09/10/17 1330 09/10/17 1618  BP: 133/90  132/66  Pulse: 93  93  Resp: 18  16   Temp: 97.9 F (36.6 C)    TempSrc: Oral    SpO2: 97%  99%  Weight:  45.4 kg (100 lb)   Height:  5\' 3"  (1.6 m)       Constitutional: BP: 133/90  132/66  Pulse: 93  93  Resp: 18  16  Temp: 97.9 F (36.6 C)    TempSrc: Oral    SpO2: 97%  99%  Weight:  45.4 kg (100 lb)   Height:  5\' 3"  (1.6 m)    Elderly frail cachectic female sitting in bed, no distress, alert awake oriented 3 Eyes: PERRL, lids and conjunctivae normal ENMT: Mucous membranes are moist.  Neck: normal, supple Respiratory: clear to auscultation bilaterally,  Cardiovascular: Regular rate and rhythm, no murmurs / rubs / gallops Abdomen: soft, non tender, Bowel sounds positive.  Musculoskeletaalso arthritis, and RA deformities noted in both hands extemity:  R leg edema and skin tear Skin: no rashes, lesions, ulcers.  Neurologic: CN 2-12 grossly intact. Sensation intact, DTR normal. Strength 5/5 in all 4.  Psychiatric: Normal judgment and insight. Alert and oriented x 3. Normal mood.   Labs on Admission: I have personally reviewed following labs and imaging studies  CBC: Recent Labs  Lab 09/10/17 1334  WBC 7.2  HGB 9.5*  HCT 30.1*  MCV 87.8  PLT 496   Basic Metabolic Panel: Recent Labs  Lab 09/10/17 1334  NA 140  K 3.5  CL 106  CO2 26  GLUCOSE 256*  BUN 15  CREATININE 0.48  CALCIUM 8.1*   GFR: Estimated Creatinine Clearance: 36.2 mL/min (by C-G formula based on SCr of 0.48 mg/dL). Liver Function Tests: Recent Labs  Lab 09/10/17 1334  AST 14*  ALT 13*  ALKPHOS 74  BILITOT 0.7  PROT 6.0*  ALBUMIN 3.1*   No results for input(s): LIPASE, AMYLASE in the last 168 hours. No results for input(s): AMMONIA in the last 168 hours. Coagulation Profile: No results for input(s): INR, PROTIME in the last 168 hours. Cardiac Enzymes: No results for input(s): CKTOTAL, CKMB, CKMBINDEX, TROPONINI in the last 168 hours. BNP (last 3 results) No results for input(s): PROBNP in the last 8760  hours. HbA1C: No results for input(s): HGBA1C in the last 72 hours. CBG: No results for input(s): GLUCAP in the last 168 hours. Lipid Profile: No results for input(s): CHOL, HDL, LDLCALC, TRIG, CHOLHDL, LDLDIRECT in the last 72 hours. Thyroid Function Tests: No results for input(s): TSH, T4TOTAL, FREET4, T3FREE, THYROIDAB in the last 72 hours. Anemia Panel: No results for input(s): VITAMINB12, FOLATE, FERRITIN, TIBC, IRON, RETICCTPCT in the last 72 hours. Urine analysis:    Component Value Date/Time   COLORURINE YELLOW 07/24/2017 1258   APPEARANCEUR HAZY (A) 07/24/2017 1258   LABSPEC 1.016 07/24/2017 1258   PHURINE 5.0 07/24/2017 1258   GLUCOSEU NEGATIVE 07/24/2017 1258   HGBUR MODERATE (A) 07/24/2017 1258   HGBUR negative 03/31/2009 [place] 07/24/2017 1258  KETONESUR 20 (A) 07/24/2017 1258   PROTEINUR NEGATIVE 07/24/2017 1258   UROBILINOGEN 0.2 08/28/2012 0543   NITRITE NEGATIVE 07/24/2017 1258   LEUKOCYTESUR TRACE (A) 07/24/2017 1258   Sepsis Labs: @LABRCNTIP (procalcitonin:4,lacticidven:4) )No results found for this or any previous visit (from the past 240 hour(s)).   Radiological Exams on Admission: No results found.   Assessment/Plan  Lower GI bleeding -Suspect diverticular bleeding, has a long history of known diverticulosis -Supportive care, bleeding scan if active bleeding ensues [facility] gastroenterology consulted by EDP tonight -montor hb closely, transfuse if Hb continues to trend down  R leg swelling -h/o trauma 1 week ago, check dopplers to r/o DVT, if negative consider ECHO  History of rheumatoid arthritis and OA -On both low-dose prednisone daily which we will continue she does not need stress dose steroids at this time  Type 2 diabetes mellitus -Hold metformin and trajenta - sensitive sliding scale insulin for now   Severe protein calorie malnutrition -Dietitian consult when active bleeding has resolved  Domenic Polite  MD [facility] Pager [phone]  If 7PM-7AM, please contact night-coverage [email or web address] Password [facility]  09/10/2017, 4:46 PM

## 2017-09-10 NOTE — ED Notes (Signed)
GI MD at bedside

## 2017-09-10 NOTE — Consult Note (Signed)
Reason for Consult: Lower GI bleeding Referring Physician: Hospital team  [name] is an 83 y.o. female.  HPI: Patient seen and examined and case discussed with the ER physician and her hospital computer chart reviewed and her last colonoscopy in 2009 was reviewed and she has had some diverticular problems in the past but not and years and she is not on any aspirin or blood thinners and her bright red blood per rectum started yesterday and she's not had any other GI issues and her family history is negative from a GI standpoint but she has had polyps  in the past as well as her diverticuli and her main problem is her arthritis which keeps her in bed most of the time  Past Medical History:  Diagnosis Date  . Acute bronchitis   . Anemia, unspecified   . Depressive disorder, not elsewhere classified   . Diverticulosis of colon (without mention of hemorrhage)   . Esophageal reflux   . Family history of malignant neoplasm of gastrointestinal tract   . Functional diarrhea   . Hiatal hernia   . Other and unspecified hyperlipidemia   . Other chest pain   . Other specified cardiac dysrhythmias(427.89)   . Personal history of colonic polyps 07/17/1995   adenomatous polyps  . PONV (postoperative nausea and vomiting)   . Rheumatoid arthritis(714.0)   . Tachycardia, unspecified   . Type II or unspecified type diabetes mellitus with other specified manifestations, not stated as uncontrolled   . Unspecified adverse effect of unspecified drug, medicinal and biological substance   . Unspecified essential hypertension   . Unspecified hypothyroidism     Past Surgical History:  Procedure Laterality Date  . CATARACT EXTRACTION     left  . ORIF FEMUR FRACTURE  08/30/2012   Procedure: OPEN REDUCTION INTERNAL FIXATION (ORIF) DISTAL FEMUR FRACTURE;  Surgeon: Mauri Pole, MD;  Location: [facility];  Service: Orthopedics;  Laterality: Right;  proximal femur  . PARTIAL HYSTERECTOMY    .  THYROIDECTOMY, PARTIAL      Family History  Problem Relation Age of Onset  . Colon cancer Maternal Grandmother     Social History:  reports that  has never smoked. she has never used smokeless tobacco. She reports that she drinks alcohol. She reports that she does not use drugs.  Allergies: No Known Allergies  Medications: I have reviewed the patient's current medications.  Results for orders placed or performed during the hospital encounter of 09/10/17 (from the past 48 hour(s))  Comprehensive metabolic panel     Status: Abnormal   Collection Time: 09/10/17  1:34 PM  Result Value Ref Range   Sodium 140 135 - 145 mmol/L   Potassium 3.5 3.5 - 5.1 mmol/L   Chloride 106 101 - 111 mmol/L   CO2 26 22 - 32 mmol/L   Glucose, Bld 256 (H) 65 - 99 mg/dL   BUN 15 6 - 20 mg/dL   Creatinine, Ser 0.48 0.44 - 1.00 mg/dL   Calcium 8.1 (L) 8.9 - 10.3 mg/dL   Total Protein 6.0 (L) 6.5 - 8.1 g/dL   Albumin 3.1 (L) 3.5 - 5.0 g/dL   AST 14 (L) 15 - 41 U/L   ALT 13 (L) 14 - 54 U/L   Alkaline Phosphatase 74 38 - 126 U/L   Total Bilirubin 0.7 0.3 - 1.2 mg/dL   GFR calc non Af Amer >60 >60 mL/min   GFR calc Af Amer >60 >60 mL/min    Comment: (  NOTE) The eGFR has been calculated using the CKD EPI equation. This calculation has not been validated in all clinical situations. eGFR's persistently <60 mL/min signify possible Chronic Kidney Disease.    Anion gap 8 5 - 15  CBC     Status: Abnormal   Collection Time: 09/10/17  1:34 PM  Result Value Ref Range   WBC 7.2 4.0 - 10.5 K/uL   RBC 3.43 (L) 3.87 - 5.11 MIL/uL   Hemoglobin 9.5 (L) 12.0 - 15.0 g/dL   HCT 30.1 (L) 36.0 - 46.0 %   MCV 87.8 78.0 - 100.0 fL   MCH 27.7 26.0 - 34.0 pg   MCHC 31.6 30.0 - 36.0 g/dL   RDW 15.4 11.5 - 15.5 %   Platelets 296 150 - 400 K/uL  Sample to Blood Bank     Status: None   Collection Time: 09/10/17  1:42 PM  Result Value Ref Range   Blood Bank Specimen SAMPLE AVAILABLE FOR TESTING    Sample Expiration  09/13/2017   POC occult blood, ED     Status: Abnormal   Collection Time: 09/10/17  4:13 PM  Result Value Ref Range   Fecal Occult Bld POSITIVE (A) NEGATIVE    No results found.  ROS negative except above and her significant arthritis complaints Blood pressure 138/88, pulse 71, temperature 97.9 F (36.6 C), temperature source Oral, resp. rate 16, height '5\' 3"'  (1.6 m), weight 45.4 kg (100 lb), SpO2 100 %. Physical Exam vital signs stable afebrile no acute distress patient lying comfortably in the bed thin but very friendly lungs clear regular rate and rhythm abdomen is soft nontender good bowel sounds labs reviewed  Assessment/Plan: Almost certainly diverticular bleeding currently stable Plan: Will allow clear liquids if bleeding seems to increase would proceed with stat nuclear bleeding scan and pending those findings consider IR urgent consult otherwise we'll check on tomorrow  [facility] E 09/10/2017, 6:08 PM

## 2017-09-10 NOTE — ED Triage Notes (Signed)
Pt reports that she has a HX of Diverticulitis. Pt reports that she has had bloody stools x 1 day. Pt reports that she has not had a flare ups  in years.  Pt was referred by [facility] for further evaluation

## 2017-09-11 ENCOUNTER — Inpatient Hospital Stay (HOSPITAL_COMMUNITY)
Admit: 2017-09-11 | Discharge: 2017-09-11 | Disposition: A | Discharge status: Home / Self Care | Payer: Medicare Other | Attending: Internal Medicine | Admitting: Internal Medicine

## 2017-09-11 ENCOUNTER — Other Ambulatory Visit: Payer: Self-pay

## 2017-09-11 DIAGNOSIS — E11621 Type 2 diabetes mellitus with foot ulcer: Secondary | ICD-10-CM

## 2017-09-11 LAB — COMPREHENSIVE METABOLIC PANEL WITH GFR
ALT: 11 U/L — ABNORMAL LOW (ref 14–54)
AST: 14 U/L — ABNORMAL LOW (ref 15–41)
Albumin: 2.8 g/dL — ABNORMAL LOW (ref 3.5–5.0)
Alkaline Phosphatase: 66 U/L (ref 38–126)
Anion gap: 8 (ref 5–15)
BUN: 13 mg/dL (ref 6–20)
CO2: 27 mmol/L (ref 22–32)
Calcium: 8.3 mg/dL — ABNORMAL LOW (ref 8.9–10.3)
Chloride: 108 mmol/L (ref 101–111)
Creatinine, Ser: 0.46 mg/dL (ref 0.44–1.00)
GFR calc Af Amer: >60 mL/min
GFR calc non Af Amer: >60 mL/min
Glucose, Bld: 115 mg/dL — ABNORMAL HIGH (ref 65–99)
Potassium: 3.5 mmol/L (ref 3.5–5.1)
Sodium: 143 mmol/L (ref 135–145)
Total Bilirubin: 0.2 mg/dL — ABNORMAL LOW (ref 0.3–1.2)
Total Protein: 5.4 g/dL — ABNORMAL LOW (ref 6.5–8.1)

## 2017-09-11 LAB — CBC
HCT: 26.9 % — ABNORMAL LOW (ref 36.0–46.0)
HCT: 29 % — ABNORMAL LOW (ref 36.0–46.0)
Hemoglobin: 8.6 g/dL — ABNORMAL LOW (ref 12.0–15.0)
Hemoglobin: 9.1 g/dL — ABNORMAL LOW (ref 12.0–15.0)
MCH: 28.1 pg (ref 26.0–34.0)
MCH: 27.6 pg (ref 26.0–34.0)
MCHC: 32 g/dL (ref 30.0–36.0)
MCHC: 31.4 g/dL (ref 30.0–36.0)
MCV: 87.9 fL (ref 78.0–100.0)
MCV: 87.9 fL (ref 78.0–100.0)
Platelets: 274 K/uL (ref 150–400)
Platelets: 290 10*3/uL (ref 150–400)
RBC: 3.06 MIL/uL — ABNORMAL LOW (ref 3.87–5.11)
RBC: 3.3 MIL/uL — AB (ref 3.87–5.11)
RDW: 15.5 % (ref 11.5–15.5)
RDW: 15.8 % — AB (ref 11.5–15.5)
WBC: 6.6 K/uL (ref 4.0–10.5)
WBC: 6.7 10*3/uL (ref 4.0–10.5)

## 2017-09-11 LAB — PROTIME-INR
INR: 1.05
Prothrombin Time: 13.6 s (ref 11.4–15.2)

## 2017-09-11 LAB — HEMOGLOBIN AND HEMATOCRIT, BLOOD
HCT: 27.3 % — ABNORMAL LOW (ref 36.0–46.0)
Hemoglobin: 8.7 g/dL — ABNORMAL LOW (ref 12.0–15.0)

## 2017-09-11 LAB — CBG MONITORING, ED
GLUCOSE-CAPILLARY: 136 mg/dL — AB (ref 65–99)
GLUCOSE-CAPILLARY: 99 mg/dL (ref 65–99)
GLUCOSE-CAPILLARY: 144 mg/dL — AB (ref 65–99)
Glucose-Capillary: 89 mg/dL (ref 65–99)

## 2017-09-11 LAB — GLUCOSE, CAPILLARY
GLUCOSE-CAPILLARY: 89 mg/dL (ref 65–99)
Glucose-Capillary: 125 mg/dL — ABNORMAL HIGH (ref 65–99)

## 2017-09-11 MED ORDER — SODIUM CHLORIDE 0.9 % IV BOLUS (SEPSIS)
500.0000 mL | Freq: Once | INTRAVENOUS | Status: AC
  Administered 2017-09-11: 500 mL via INTRAVENOUS

## 2017-09-11 NOTE — Progress Notes (Signed)
[facility] PROGRESS NOTE Subjective nno pain, apparently stools are brown with no gross blood.the sitter reports that the stool was brown  Objective: Vital signs in last 24 hours: Pulse Rate:  [50-140] 88 (12/19 1411) Resp:  [15-18] 18 (12/19 1411) BP: (110-161)/(55-128) 135/87 (12/19 1411) SpO2:  [90 %-100 %] 95 % (12/19 1411)    Intake/Output from previous day: No intake/output data recorded. Intake/Output this shift: No intake/output data recorded.  PE: General--pleasantly confused no distress  Abdomen--soft and nontender  Lab Results: Recent Labs    09/10/17 1334 09/11/17 0633  WBC 7.2 6.6  HGB 9.5* 8.6*  HCT 30.1* 26.9*  PLT 296 274   BMET Recent Labs    09/10/17 1334 09/11/17 0633  NA 140 143  K 3.5 3.5  CL 106 108  CO2 26 27  CREATININE 0.48 0.46   LFT Recent Labs    09/10/17 1334 09/11/17 0633  PROT 6.0* 5.4*  AST 14* 14*  ALT 13* 11*  ALKPHOS 74 66  BILITOT 0.7 0.2*   PT/INR Recent Labs    09/11/17 0633  LABPROT 13.6  INR 1.05   PANCREAS No results for input(s): LIPASE in the last 72 hours.       Studies/Results: No results found.  Medications: I have reviewed the patient's current medications.  Assessment:   1. Lower GI bleed.  Possibly diverticular appears to be doing fairly well   Plan: Would continue on clear liquids at least one more day in case colonoscopy needed. Hopefully she will stop.   Nancy Fetter 09/11/2017, 3:25 PM  This note was created using voice recognition software. Minor errors may Have occurred unintentionally.  Pager: [phone] If no answer or after hours call [phone]

## 2017-09-11 NOTE — ED Notes (Signed)
Hospitalist at bedside 

## 2017-09-11 NOTE — ED Notes (Signed)
US at bedside

## 2017-09-11 NOTE — ED Notes (Signed)
Ordered food tray, has not arrived as of yet

## 2017-09-11 NOTE — ED Notes (Signed)
Pt is refusing CBG's. Pt states that she is miserable and tired of getting her fingers pricked. Pt states that she has been doing it for the past 5 months and refuses to have it done anymore. Pt states that her fingers are very sore.

## 2017-09-11 NOTE — ED Notes (Signed)
Requested food tray repeat

## 2017-09-11 NOTE — ED Notes (Addendum)
Pt able to tell you she has to go to the bathroom, but because of the rectal bleeding has on a brief.

## 2017-09-11 NOTE — ED Notes (Signed)
Assigned 1413 @ 14:08 call report @ 14:28

## 2017-09-11 NOTE — Progress Notes (Signed)
PROGRESS NOTE    [name]  ACZ:[id number] DOB: 27-May-1929 DOA: 09/10/2017 PCP: Deland Pretty, MD  Brief Narrative: [name] is a 83 y.o. female with medical history significant of diverticulosis, rheumatoid arthritis, osteoarthritis, severe protein calorie malnutrition presents from home with painless lower GI bleeding which started Monday around noon, progressively worsened to multiple episodes with large clots today and hence presented to the emergency room. She denies any fevers or chills. Not on any anticoagulants or antiplatelet agents no history of alcohol or NSAID use ED Course: Hemoglobin is 9 down from 13 a month ago, Howie Ill consulted  Assessment & Plan:   Lower GI bleeding/diverticular bleeding -has a long history of known diverticulosis -appears to be subsiding -Supportive care, bleeding scan if active bleeding ensues [facility] gastroenterology following -montor hb closely, transfuse if Hb continues to trend down  R leg swelling -h/o trauma 1 week ago, dopplers negative for DVT  History of rheumatoid arthritis and OA -On both low-dose prednisone daily which we will continue she does not need stress dose steroids at this time  Type 2 diabetes mellitus -Hold metformin and trajenta - sensitive sliding scale insulin for now   Severe protein calorie malnutrition -Dietitian consult when active bleeding has resolved  DVt proph: SCDs  Full Code Family: none at bedside Dispo: home when bleeding resolved      Consultants:   Howie Ill   Procedures:   Antimicrobials:    Subjective: -no bleeding this am, on clears  Objective: Vitals:   09/11/17 1130 09/11/17 1200 09/11/17 1300 09/11/17 1411  BP: 115/68 111/89 (!) 110/55 135/87  Pulse: (!) 50 (!) 107 97 88  Resp: 18 18 16 18   Temp:      TempSrc:      SpO2: 97% 96% 95% 95%  Weight:      Height:       No intake or output data in the 24 hours ending 09/11/17 1511 Filed Weights   09/10/17 1330  Weight: 45.4 kg (100 lb)    Examination:  General exam: Appears calm and comfortable, frail elderly female Respiratory system: Clear to auscultation. Respiratory effort normal. Cardiovascular system: S1 & S2 heard, RRR Gastrointestinal system: Abdomen is nondistended, soft and nontender.Normal bowel sounds heard. Central nervous system: Alert and oriented. No focal neurological deficits. Extremities: Symmetric 5 x 5 power, deformities of hands Skin: No rashes, lesions or ulcers Psychiatry: Judgement and insight appear normal. Mood & affect appropriate.     Data Reviewed:   CBC: Recent Labs  Lab 09/10/17 1334 09/11/17 0633  WBC 7.2 6.6  HGB 9.5* 8.6*  HCT 30.1* 26.9*  MCV 87.8 87.9  PLT 296 109   Basic Metabolic Panel: Recent Labs  Lab 09/10/17 1334 09/11/17 0633  NA 140 143  K 3.5 3.5  CL 106 108  CO2 26 27  GLUCOSE 256* 115*  BUN 15 13  CREATININE 0.48 0.46  CALCIUM 8.1* 8.3*   GFR: Estimated Creatinine Clearance: 36.2 mL/min (by C-G formula based on SCr of 0.46 mg/dL). Liver Function Tests: Recent Labs  Lab 09/10/17 1334 09/11/17 0633  AST 14* 14*  ALT 13* 11*  ALKPHOS 74 66  BILITOT 0.7 0.2*  PROT 6.0* 5.4*  ALBUMIN 3.1* 2.8*   No results for input(s): LIPASE, AMYLASE in the last 168 hours. No results for input(s): AMMONIA in the last 168 hours. Coagulation Profile: Recent Labs  Lab 09/11/17 0633  INR 1.05   Cardiac Enzymes: No results for input(s): CKTOTAL, CKMB,  CKMBINDEX, TROPONINI in the last 168 hours. BNP (last 3 results) No results for input(s): PROBNP in the last 8760 hours. HbA1C: No results for input(s): HGBA1C in the last 72 hours. CBG: Recent Labs  Lab 09/10/17 2225 09/11/17 0258 09/11/17 0638 09/11/17 0854 09/11/17 1323  GLUCAP 160* 136* 99 89 144*   Lipid Profile: No results for input(s): CHOL, HDL, LDLCALC, TRIG, CHOLHDL, LDLDIRECT in the last 72 hours. Thyroid Function Tests: No results for  input(s): TSH, T4TOTAL, FREET4, T3FREE, THYROIDAB in the last 72 hours. Anemia Panel: No results for input(s): VITAMINB12, FOLATE, FERRITIN, TIBC, IRON, RETICCTPCT in the last 72 hours. Urine analysis:    Component Value Date/Time   COLORURINE YELLOW 07/24/2017 1258   APPEARANCEUR HAZY (A) 07/24/2017 1258   LABSPEC 1.016 07/24/2017 1258   PHURINE 5.0 07/24/2017 1258   GLUCOSEU NEGATIVE 07/24/2017 1258   HGBUR MODERATE (A) 07/24/2017 1258   HGBUR negative 03/31/2009 1047   BILIRUBINUR NEGATIVE 07/24/2017 1258   KETONESUR 20 (A) 07/24/2017 1258   PROTEINUR NEGATIVE 07/24/2017 1258   UROBILINOGEN 0.2 08/28/2012 0543   NITRITE NEGATIVE 07/24/2017 1258   LEUKOCYTESUR TRACE (A) 07/24/2017 1258   Sepsis Labs: @LABRCNTIP (procalcitonin:4,lacticidven:4)  )No results found for this or any previous visit (from the past 240 hour(s)).       Radiology Studies: No results found.      Scheduled Meds: . diclofenac sodium  2 g Topical QID  . insulin aspart  0-9 Units Subcutaneous Q4H  . levothyroxine  150 mcg Oral QAC breakfast  . predniSONE  4 mg Oral Q breakfast   Continuous Infusions:   LOS: 1 day    Time spent: 12min    Domenic Polite, MD [facility] Page via [email or web address], password [email or web address] After 7PM please contact night-coverage  09/11/2017, 3:11 PM

## 2017-09-11 NOTE — Progress Notes (Signed)
Bilateral lower extremity venous duplex has been completed. Negative for DVT.  09/11/17 11:53 AM [name] RVT

## 2017-09-12 DIAGNOSIS — M160 Bilateral primary osteoarthritis of hip: Secondary | ICD-10-CM | POA: Diagnosis not present

## 2017-09-12 DIAGNOSIS — M069 Rheumatoid arthritis, unspecified: Secondary | ICD-10-CM | POA: Diagnosis not present

## 2017-09-12 DIAGNOSIS — M6281 Muscle weakness (generalized): Secondary | ICD-10-CM | POA: Diagnosis not present

## 2017-09-12 DIAGNOSIS — S7001XD Contusion of right hip, subsequent encounter: Secondary | ICD-10-CM | POA: Diagnosis not present

## 2017-09-12 LAB — BASIC METABOLIC PANEL
ANION GAP: 9 (ref 5–15)
BUN: 9 mg/dL (ref 6–20)
CHLORIDE: 109 mmol/L (ref 101–111)
CO2: 23 mmol/L (ref 22–32)
Calcium: 8.1 mg/dL — ABNORMAL LOW (ref 8.9–10.3)
Creatinine, Ser: 0.44 mg/dL (ref 0.44–1.00)
Glucose, Bld: 83 mg/dL (ref 65–99)
POTASSIUM: 3.4 mmol/L — AB (ref 3.5–5.1)
SODIUM: 141 mmol/L (ref 135–145)

## 2017-09-12 LAB — CBC
HCT: 28.2 % — ABNORMAL LOW (ref 36.0–46.0)
HEMOGLOBIN: 9.1 g/dL — AB (ref 12.0–15.0)
MCH: 28.4 pg (ref 26.0–34.0)
MCHC: 32.3 g/dL (ref 30.0–36.0)
MCV: 88.1 fL (ref 78.0–100.0)
Platelets: 284 10*3/uL (ref 150–400)
RBC: 3.2 MIL/uL — AB (ref 3.87–5.11)
RDW: 15.7 % — ABNORMAL HIGH (ref 11.5–15.5)
WBC: 7.3 10*3/uL (ref 4.0–10.5)

## 2017-09-12 LAB — GLUCOSE, CAPILLARY
GLUCOSE-CAPILLARY: 108 mg/dL — AB (ref 65–99)
GLUCOSE-CAPILLARY: 184 mg/dL — AB (ref 65–99)
GLUCOSE-CAPILLARY: 76 mg/dL (ref 65–99)
GLUCOSE-CAPILLARY: 78 mg/dL (ref 65–99)
Glucose-Capillary: 240 mg/dL — ABNORMAL HIGH (ref 65–99)
Glucose-Capillary: 168 mg/dL — ABNORMAL HIGH (ref 65–99)

## 2017-09-12 NOTE — Progress Notes (Signed)
Pt had 9 beat run of v tach. Asymptomatic. On call MD made aware. Will continue to monitor.

## 2017-09-12 NOTE — Progress Notes (Signed)
[name] 11:15 AM  Subjective: Patient without signs of bleeding and no GI complaints and is excited to eat her pancakes and hopefully will go home soon  Objective: Vital signs stable afebrile no acute distress abdomen is soft nontender hemoglobin stable  Assessment: Seemingly resolved diverticular bleeding  Plan: Agree with advancing diet please let me know if I could be of any further assistance with this hospital stay  [facility] E  Pager [phone] After 5PM or if no answer call [phone]

## 2017-09-12 NOTE — Care Management Note (Signed)
Case Management Note  Patient Details  Name: [name] MRN: [id number] Date of Birth: 02/08/1929  Subjective/Objective:  Pt admitted with lower GI bleed                  Action/Plan: Pt will discharge home with [facility] for [facility]. [facility] will also pick pt up, will need to be called and have longer than an hour pick up.    Expected Discharge Date:  (unknown)               Expected Discharge Plan:  Home/Self Care  In-House Referral:     Discharge planning Services  CM Consult  Post Acute Care Choice:    Choice offered to:     DME Arranged:    DME Agency:     HH Arranged:    [place] Agency:     Status of Service:  Completed, signed off  If discussed at [employer] of Stay Meetings, dates discussed:    Additional CommentsPurcell Mouton, RN 09/12/2017, 11:40 AM

## 2017-09-12 NOTE — Progress Notes (Signed)
[name] HR fluctuating between 110's-140's per CCMD. On call MD made aware. Obtained EKG and administered 500 mL bolus x2. HR low 100's currently.

## 2017-09-12 NOTE — Progress Notes (Signed)
PROGRESS NOTE    [name]  WCH:[id number] DOB: [birth year] DOA: 09/10/2017 PCP: Deland Pretty, MD  Brief Narrative: [name] is a 83 y.o. female with medical history significant of diverticulosis, rheumatoid arthritis, osteoarthritis, severe protein calorie malnutrition presents from home with painless lower GI bleeding which started Monday around noon, progressively worsened to multiple episodes with large clots today and hence presented to the emergency room. She denies any fevers or chills. Not on any anticoagulants or antiplatelet agents no history of alcohol or NSAID use ED Course: Hemoglobin is 9 down from 13 a month ago, Howie Ill consulted  Assessment & Plan:   Lower GI bleeding/diverticular bleeding -has a long history of known diverticulosis -appears to have subsided -continue supportive care, bleeding scan if active bleeding ensues [facility] gastroenterology following -advance diet, OOB, ambulate, DC planning  R leg swelling -h/o trauma 1 week ago, dopplers negative for DVT -improved  History of rheumatoid arthritis and OA -On both low-dose prednisone daily which we will continue   Type 2 diabetes mellitus -Hold metformin and trajenta - sensitive sliding scale insulin for now   Severe protein calorie malnutrition -Dietitian consult when active bleeding has resolved  DVt proph: SCDs  Full Code Family: none at bedside Dispo: home when bleeding resolved  Consultants:   Howie Ill   Procedures:   Antimicrobials:    Subjective: -feels ok, no bleeding for 24hours  Objective: Vitals:   09/11/17 1411 09/11/17 1542 09/11/17 1952 09/12/17 0519  BP: 135/87 123/71 114/77 124/77  Pulse: 88 95 80 (!) 107  Resp: 18 18 20 20   Temp:  98.2 F (36.8 C) 97.7 F (36.5 C) 97.9 F (36.6 C)  TempSrc:  Oral Oral Oral  SpO2: 95% 95% 95% 95%  Weight:  39 kg (85 lb 15.7 oz)    Height:  5\' 6"  (1.676 m)      Intake/Output Summary (Last 24 hours)  at 09/12/2017 1343 Last data filed at 09/12/2017 1221 Gross per 24 hour  Intake 1300 ml  Output 500 ml  Net 800 ml   Filed Weights   09/10/17 1330 09/11/17 1542  Weight: 45.4 kg (100 lb) 39 kg (85 lb 15.7 oz)    Examination:  Gen: frail elderly female,  Awake, Alert, Oriented X 3,  HEENT: PERRLA, Neck supple, no JVD Lungs: Good air movement bilaterally, CTAB CVS: RRR,No Gallops,Rubs or new Murmurs Abd: soft, Non tender, non distended, BS present Extremities: No Cyanosis, Clubbing or edema, OA deformities noted Skin: no new rashes     Data Reviewed:   CBC: Recent Labs  Lab 09/10/17 1334 09/11/17 0633 09/11/17 1552 09/11/17 2331 09/12/17 0641  WBC 7.2 6.6 6.7  --  7.3  HGB 9.5* 8.6* 9.1* 8.7* 9.1*  HCT 30.1* 26.9* 29.0* 27.3* 28.2*  MCV 87.8 87.9 87.9  --  88.1  PLT 296 274 290  --  353   Basic Metabolic Panel: Recent Labs  Lab 09/10/17 1334 09/11/17 0633 09/12/17 0641  NA 140 143 141  K 3.5 3.5 3.4*  CL 106 108 109  CO2 26 27 23   GLUCOSE 256* 115* 83  BUN 15 13 9   CREATININE 0.48 0.46 0.44  CALCIUM 8.1* 8.3* 8.1*   GFR: Estimated Creatinine Clearance: 31.1 mL/min (by C-G formula based on SCr of 0.44 mg/dL). Liver Function Tests: Recent Labs  Lab 09/10/17 1334 09/11/17 0633  AST 14* 14*  ALT 13* 11*  ALKPHOS 74 66  BILITOT 0.7 0.2*  PROT 6.0* 5.4*  ALBUMIN 3.1* 2.8*   No results for input(s): LIPASE, AMYLASE in the last 168 hours. No results for input(s): AMMONIA in the last 168 hours. Coagulation Profile: Recent Labs  Lab 09/11/17 0633  INR 1.05   Cardiac Enzymes: No results for input(s): CKTOTAL, CKMB, CKMBINDEX, TROPONINI in the last 168 hours. BNP (last 3 results) No results for input(s): PROBNP in the last 8760 hours. HbA1C: No results for input(s): HGBA1C in the last 72 hours. CBG: Recent Labs  Lab 09/11/17 1632 09/11/17 2017 09/12/17 0013 09/12/17 0458 09/12/17 0722  GLUCAP 125* 89 108* 78 76   Lipid Profile: No  results for input(s): CHOL, HDL, LDLCALC, TRIG, CHOLHDL, LDLDIRECT in the last 72 hours. Thyroid Function Tests: No results for input(s): TSH, T4TOTAL, FREET4, T3FREE, THYROIDAB in the last 72 hours. Anemia Panel: No results for input(s): VITAMINB12, FOLATE, FERRITIN, TIBC, IRON, RETICCTPCT in the last 72 hours. Urine analysis:    Component Value Date/Time   COLORURINE YELLOW 07/24/2017 1258   APPEARANCEUR HAZY (A) 07/24/2017 1258   LABSPEC 1.016 07/24/2017 1258   PHURINE 5.0 07/24/2017 1258   GLUCOSEU NEGATIVE 07/24/2017 1258   HGBUR MODERATE (A) 07/24/2017 1258   HGBUR negative 03/31/2009 1047   BILIRUBINUR NEGATIVE 07/24/2017 1258   KETONESUR 20 (A) 07/24/2017 1258   PROTEINUR NEGATIVE 07/24/2017 1258   UROBILINOGEN 0.2 08/28/2012 0543   NITRITE NEGATIVE 07/24/2017 1258   LEUKOCYTESUR TRACE (A) 07/24/2017 1258   Sepsis Labs: @LABRCNTIP (procalcitonin:4,lacticidven:4)  )No results found for this or any previous visit (from the past 240 hour(s)).       Radiology Studies: No results found.      Scheduled Meds: . diclofenac sodium  2 g Topical QID  . insulin aspart  0-9 Units Subcutaneous Q4H  . levothyroxine  150 mcg Oral QAC breakfast  . predniSONE  4 mg Oral Q breakfast   Continuous Infusions:   LOS: 2 days    Time spent: 64min    Domenic Polite, MD [facility] Page via [email or web address], password [email or web address] After 7PM please contact night-coverage  09/12/2017, 1:43 PM

## 2017-09-12 NOTE — Progress Notes (Signed)
Pt is refusing care for tonight claiming we are "forcing her" and saying that she does not want anything done to her anymore. Pt has been combative and tried to hit staff when transferring pt from wheelchair to bed. Staff has educated pt on importance of safety measures, obtaining vitals, providing care, and taking medications. Pt refused all education and will not let staff take vitals or give medications.

## 2017-09-12 NOTE — Progress Notes (Signed)
Pt is active with [facility] [phone], for Home Health. Spoke with Merleen Nicely at [facility] will also provide transportation home. Will need to call more than an hour prior to pt being discharged please.

## 2017-09-12 NOTE — Evaluation (Signed)
Physical Therapy Evaluation Patient Details Name: [name] MRN: [id number] DOB: July 26, 1929 Today's Date: 09/12/2017   History of Present Illness  83 y.o. female with medical history significant of diverticulosis, rheumatoid arthritis, osteoarthritis, severe protein calorie malnutrition presents from home with painless lower GI bleeding  Clinical Impression  Pt admitted with above diagnosis. Pt currently with functional limitations due to the deficits listed below (see PT Problem List).  Pt will benefit from skilled PT to increase their independence and safety with mobility to allow discharge to the venue listed below.  Pt reports recently trying to decrease her home care from 24 hrs to 12 hrs.  Pt reports she can transfer modified independently sometimes however usually has caregiver assist when they are present.  Pt possibly close to her baseline and states she was receiving HHPT to work on exercises and ambulation prior to admission.  Pt assisted OOB to her w/c (with alarm and bed pads).  (bed linen soaked with urine despite external catheter - RN notified)     Follow Up Recommendations Home health PT;Supervision/Assistance - 24 hour    Equipment Recommendations  None recommended by PT    Recommendations for Other Services       Precautions / Restrictions Precautions Precautions: Fall Restrictions Weight Bearing Restrictions: No      Mobility  Bed Mobility Overal bed mobility: Needs Assistance Bed Mobility: Supine to Sit     Supine to sit: Mod assist     General bed mobility comments: assist for lower body, pt able to pull herself upright with HHA  Transfers Overall transfer level: Needs assistance Equipment used: None Transfers: Sit to/from [facility] Sit to Stand: Mod assist Stand pivot transfers: Mod assist       General transfer comment: pt assisted with stand pivot, blocked knees for safety, pivoted from bed to pt's  w/c  Ambulation/Gait                Stairs            Wheelchair Mobility    Modified Rankin (Stroke Patients Only)       Balance                                             Pertinent Vitals/Pain Pain Assessment: No/denies pain    Home Living Family/patient expects to be discharged to:: Private residence Living Arrangements: Alone Available Help at Discharge: Personal care attendant;Home health Type of Home: House Home Access: Stairs to enter Entrance Stairs-Rails: None Entrance Stairs-Number of Steps: 1 Home Layout: One level Home Equipment: Environmental consultant - 2 wheels;Wheelchair - manual      Prior Function Level of Independence: Needs assistance   Gait / Transfers Assistance Needed: reports assist from caregivers when they are home, however also reports she is able to transfer to bathroom and bed on her own  ADL's / Homemaking Assistance Needed: requires assist, severe RA in hands        Hand Dominance        Extremity/Trunk Assessment   Upper Extremity Assessment Upper Extremity Assessment: Generalized weakness(severe RA in hands)    Lower Extremity Assessment Lower Extremity Assessment: Generalized weakness    Cervical / Trunk Assessment Cervical / Trunk Assessment: Kyphotic  Communication   Communication: No difficulties  Cognition Arousal/Alertness: Awake/alert Behavior During Therapy: WFL for tasks assessed/performed Overall Cognitive Status: Within Functional  Limits for tasks assessed                                        General Comments      Exercises General Exercises - Lower Extremity Ankle Circles/Pumps: AROM;Both;10 reps Long Arc Quad: AROM;10 reps;Both;Seated Hip Flexion/Marching: AROM;Both;10 reps   Assessment/Plan    PT Assessment Patient needs continued PT services  PT Problem List Decreased strength;Decreased mobility;Decreased activity tolerance;Decreased balance;Decreased knowledge  of use of DME;Decreased knowledge of precautions       PT Treatment Interventions DME instruction;Therapeutic activities;Gait training;Therapeutic exercise;Functional mobility training;Wheelchair mobility training;Patient/family education    PT Goals (Current goals can be found in the Care Plan section)  Acute Rehab PT Goals PT Goal Formulation: With patient Time For Goal Achievement: 09/26/17 Potential to Achieve Goals: Good    Frequency Min 2X/week   Barriers to discharge        Co-evaluation               AM-PAC PT "6 Clicks" Daily Activity  Outcome Measure Difficulty turning over in bed (including adjusting bedclothes, sheets and blankets)?: A Lot Difficulty moving from lying on back to sitting on the side of the bed? : Unable Difficulty sitting down on and standing up from a chair with arms (e.g., wheelchair, bedside commode, etc,.)?: Unable Help needed moving to and from a bed to chair (including a wheelchair)?: A Lot Help needed walking in hospital room?: Total Help needed climbing 3-5 steps with a railing? : Total 6 Click Score: 8    End of Session Equipment Utilized During Treatment: Gait belt Activity Tolerance: Patient tolerated treatment well Patient left: in chair;with chair alarm set Nurse Communication: Mobility status PT Visit Diagnosis: Other abnormalities of gait and mobility (R26.89)    Time: 1325-1405 PT Time Calculation (min) (ACUTE ONLY): 40 min   Charges:   PT Evaluation $PT Eval Low Complexity: 1 Low PT Treatments $Therapeutic Activity: 8-22 mins   PT G Codes:        Carmelia Bake, PT, DPT 09/12/2017 Pager: [phone]  York Ram E 09/12/2017, 3:51 PM

## 2017-09-13 LAB — CBC
HEMATOCRIT: 26.4 % — AB (ref 36.0–46.0)
HEMOGLOBIN: 8.5 g/dL — AB (ref 12.0–15.0)
MCH: 28 pg (ref 26.0–34.0)
MCHC: 32.2 g/dL (ref 30.0–36.0)
MCV: 86.8 fL (ref 78.0–100.0)
Platelets: 280 10*3/uL (ref 150–400)
RBC: 3.04 MIL/uL — ABNORMAL LOW (ref 3.87–5.11)
RDW: 15.7 % — ABNORMAL HIGH (ref 11.5–15.5)
WBC: 5.6 10*3/uL (ref 4.0–10.5)

## 2017-09-13 LAB — GLUCOSE, CAPILLARY
GLUCOSE-CAPILLARY: 106 mg/dL — AB (ref 65–99)
Glucose-Capillary: 133 mg/dL — ABNORMAL HIGH (ref 65–99)
Glucose-Capillary: 186 mg/dL — ABNORMAL HIGH (ref 65–99)

## 2017-09-13 NOTE — Care Management Important Message (Signed)
Important Message  Patient Details  Name: JANAN BOGIE MRN: [id number] Date of Birth: May 16, 1929   Medicare Important Message Given:  Yes    Kerin Salen 09/13/2017, 11:52 AMImportant Message  Patient Details  Name: NNENNA MEADOR MRN: [id number] Date of Birth: 1928-10-23   Medicare Important Message Given:  Yes    Kerin Salen 09/13/2017, 11:52 AM

## 2017-09-13 NOTE — Discharge Summary (Signed)
Physician Discharge Summary  [name]:[id number] DOB: Jan 27, 1929 DOA: 09/10/2017  PCP: Deland Pretty, MD  Admit date: 09/10/2017 Discharge date: 09/13/2017  Time spent: 45 minutes  Recommendations for Outpatient Follow-up:  1. PCP in 1 week, please check CBC at FU   Discharge Diagnoses:  Principal Problem:   Lower GI bleeding   Diverticular bleeding   Hypothyroidism   Type 2 diabetes mellitus with other specified complication (HCC)   Essential hypertension   Rheumatoid arthritis ([place])   History of gastroesophageal reflux (GERD)   Lower GI bleed   GI bleed   Discharge Condition: stable  Diet recommendation: DM low sodium  Filed Weights   09/10/17 1330 09/11/17 1542  Weight: 45.4 kg (100 lb) 39 kg (85 lb 15.7 oz)    History of present illness:  [name] a 83 y.o.femalewith medical history significant ofdiverticulosis, rheumatoid arthritis, osteoarthritis, severe protein calorie malnutrition presents from home with painless lower GI bleeding which started Monday around noon,progressively worsened to multiple episodes with large clots   Hospital Course:   Lower GI bleeding/diverticular bleeding -has a long history of known diverticulosis -appears subsided spontaneously -[facility] consulted -Clinically felt to be stable for discharge given absence of ongoing bleeding and relatively stable hemoglobin at this time Home health services with PT and RN set up  R leg swelling -h/o trauma 1 week ago, dopplers negative for DVT -improved  History of rheumatoid arthritisand OA Continued on home regimen of low-dose prednisone  Type 2 diabetes mellitus Resumed  metformin andtrajenta -Stable  Severe protein calorie malnutrition -Increase proteins and supplements recommended   Full Code, needs further discussions    Consultations:  [facility]  Discharge Exam: Vitals:   09/12/17 1515 09/13/17 0500  BP: 119/73 (!)  146/59  Pulse: 85 98  Resp: 20 18  Temp: 97.8 F (36.6 C) 98.4 F (36.9 C)  SpO2: 98% 100%    General: AAOx3 Cardiovascular: S1S2/RRR Respiratory: CTAB  Discharge Instructions   Discharge Instructions    Diet - low sodium heart healthy   Complete by:  As directed    Diet Carb Modified   Complete by:  As directed    Increase activity slowly   Complete by:  As directed      Allergies as of 09/13/2017   No Known Allergies     Medication List    STOP taking these medications   cephALEXin 500 MG capsule Commonly known as:  KEFLEX   meclizine 25 MG tablet Commonly known as:  ANTIVERT   ondansetron 4 MG disintegrating tablet Commonly known as:  ZOFRAN ODT     TAKE these medications   acetaminophen 325 MG tablet Commonly known as:  TYLENOL Take 650 mg by mouth daily as needed.   alendronate 70 MG tablet Commonly known as:  FOSAMAX Take 70 mg by mouth every Saturday.   folic acid 1 MG tablet Commonly known as:  FOLVITE Take 1 mg by mouth daily.   ibuprofen 200 MG tablet Commonly known as:  ADVIL,MOTRIN Take 400 mg by mouth 2 (two) times daily as needed.   leflunomide 20 MG tablet Commonly known as:  ARAVA Take 20 mg by mouth daily.   linagliptin 5 MG Tabs tablet Commonly known as:  TRADJENTA Take 5 mg by mouth daily.   LIVALO 2 MG Tabs Generic drug:  Pitavastatin Calcium Take 2 mg by mouth every Monday, Wednesday, and Friday at 6 PM.   metFORMIN 500 MG 24 hr tablet Commonly known  as:  GLUCOPHAGE-XR Take 500-1,000 mg by mouth See admin instructions. Take 2 tablets in the morning, and 1 tablet in the evening   omeprazole 40 MG capsule Commonly known as:  PRILOSEC Take 40 mg by mouth daily.   predniSONE 1 MG tablet Commonly known as:  DELTASONE Take 4 mg by mouth daily with breakfast.   SYNTHROID 150 MCG tablet Generic drug:  levothyroxine Take 150 mcg by mouth daily before breakfast.   TRULICITY 3.14 HF/0.2OV Sopn Generic drug:   Dulaglutide Inject 0.75 mg into the skin once a week.   VOLTAREN 1 % Gel Generic drug:  diclofenac sodium Apply 2 g topically 4 (four) times daily.      No Known Allergies Follow-up Information    Deland Pretty, MD. Schedule an appointment as soon as possible for a visit in 1 week(s).   Specialty:  Internal Medicine Contact information: [address] [place] [address] [phone]            The results of significant diagnostics from this hospitalization (including imaging, microbiology, ancillary and laboratory) are listed below for reference.    Significant Diagnostic Studies: No results found.  Microbiology: No results found for this or any previous visit (from the past 240 hour(s)).   Labs: Basic Metabolic Panel: Recent Labs  Lab 09/10/17 1334 09/11/17 0633 09/12/17 0641  NA 140 143 141  K 3.5 3.5 3.4*  CL 106 108 109  CO2 26 27 23   GLUCOSE 256* 115* 83  BUN 15 13 9   CREATININE 0.48 0.46 0.44  CALCIUM 8.1* 8.3* 8.1*   Liver Function Tests: Recent Labs  Lab 09/10/17 1334 09/11/17 0633  AST 14* 14*  ALT 13* 11*  ALKPHOS 74 66  BILITOT 0.7 0.2*  PROT 6.0* 5.4*  ALBUMIN 3.1* 2.8*   No results for input(s): LIPASE, AMYLASE in the last 168 hours. No results for input(s): AMMONIA in the last 168 hours. CBC: Recent Labs  Lab 09/10/17 1334 09/11/17 8676 09/11/17 1552 09/11/17 2331 09/12/17 0641 09/13/17 0518  WBC 7.2 6.6 6.7  --  7.3 5.6  HGB 9.5* 8.6* 9.1* 8.7* 9.1* 8.5*  HCT 30.1* 26.9* 29.0* 27.3* 28.2* 26.4*  MCV 87.8 87.9 87.9  --  88.1 86.8  PLT 296 274 290  --  284 280   Cardiac Enzymes: No results for input(s): CKTOTAL, CKMB, CKMBINDEX, TROPONINI in the last 168 hours. BNP: BNP (last 3 results) No results for input(s): BNP in the last 8760 hours.  ProBNP (last 3 results) No results for input(s): PROBNP in the last 8760 hours.  CBG: Recent Labs  Lab 09/12/17 1639 09/12/17 2036 09/13/17 0448  09/13/17 0853 09/13/17 1151  GLUCAP 240* 168* 133* 106* 186*       Signed:  Domenic Polite MD.  [facility] 09/13/2017, 1:14 PM

## 2017-09-18 DIAGNOSIS — N390 Urinary tract infection, site not specified: Secondary | ICD-10-CM | POA: Diagnosis not present

## 2017-09-18 DIAGNOSIS — N3090 Cystitis, unspecified without hematuria: Secondary | ICD-10-CM | POA: Diagnosis not present

## 2017-09-19 DIAGNOSIS — E119 Type 2 diabetes mellitus without complications: Secondary | ICD-10-CM | POA: Diagnosis not present

## 2017-09-19 DIAGNOSIS — E7800 Pure hypercholesterolemia, unspecified: Secondary | ICD-10-CM | POA: Diagnosis not present

## 2017-09-19 DIAGNOSIS — Z09 Encounter for follow-up examination after completed treatment for conditions other than malignant neoplasm: Secondary | ICD-10-CM | POA: Diagnosis not present

## 2017-09-19 DIAGNOSIS — I10 Essential (primary) hypertension: Secondary | ICD-10-CM | POA: Diagnosis not present

## 2017-09-25 DIAGNOSIS — E119 Type 2 diabetes mellitus without complications: Secondary | ICD-10-CM | POA: Diagnosis not present

## 2017-09-25 DIAGNOSIS — M069 Rheumatoid arthritis, unspecified: Secondary | ICD-10-CM | POA: Diagnosis not present

## 2017-09-25 DIAGNOSIS — M6281 Muscle weakness (generalized): Secondary | ICD-10-CM | POA: Diagnosis not present

## 2017-09-25 DIAGNOSIS — E46 Unspecified protein-calorie malnutrition: Secondary | ICD-10-CM | POA: Diagnosis not present

## 2017-09-25 DIAGNOSIS — Z8719 Personal history of other diseases of the digestive system: Secondary | ICD-10-CM | POA: Diagnosis not present

## 2017-09-25 DIAGNOSIS — S7001XD Contusion of right hip, subsequent encounter: Secondary | ICD-10-CM | POA: Diagnosis not present

## 2017-09-25 DIAGNOSIS — L89152 Pressure ulcer of sacral region, stage 2: Secondary | ICD-10-CM | POA: Diagnosis not present

## 2017-09-25 DIAGNOSIS — Z09 Encounter for follow-up examination after completed treatment for conditions other than malignant neoplasm: Secondary | ICD-10-CM | POA: Diagnosis not present

## 2017-09-25 DIAGNOSIS — M160 Bilateral primary osteoarthritis of hip: Secondary | ICD-10-CM | POA: Diagnosis not present

## 2017-09-25 DIAGNOSIS — M4800 Spinal stenosis, site unspecified: Secondary | ICD-10-CM | POA: Diagnosis not present

## 2017-09-27 DIAGNOSIS — E11621 Type 2 diabetes mellitus with foot ulcer: Secondary | ICD-10-CM | POA: Diagnosis not present

## 2017-09-27 DIAGNOSIS — E1149 Type 2 diabetes mellitus with other diabetic neurological complication: Secondary | ICD-10-CM | POA: Diagnosis not present

## 2017-09-27 DIAGNOSIS — I10 Essential (primary) hypertension: Secondary | ICD-10-CM | POA: Diagnosis not present

## 2017-09-27 DIAGNOSIS — E039 Hypothyroidism, unspecified: Secondary | ICD-10-CM | POA: Diagnosis not present

## 2017-09-27 DIAGNOSIS — D509 Iron deficiency anemia, unspecified: Secondary | ICD-10-CM | POA: Diagnosis not present

## 2017-09-27 DIAGNOSIS — M79643 Pain in unspecified hand: Secondary | ICD-10-CM | POA: Diagnosis not present

## 2017-09-27 DIAGNOSIS — Z79899 Other long term (current) drug therapy: Secondary | ICD-10-CM | POA: Diagnosis not present

## 2017-09-27 DIAGNOSIS — M810 Age-related osteoporosis without current pathological fracture: Secondary | ICD-10-CM | POA: Diagnosis not present

## 2017-09-27 DIAGNOSIS — E119 Type 2 diabetes mellitus without complications: Secondary | ICD-10-CM | POA: Diagnosis not present

## 2017-09-27 DIAGNOSIS — M0579 Rheumatoid arthritis with rheumatoid factor of multiple sites without organ or systems involvement: Secondary | ICD-10-CM | POA: Diagnosis not present

## 2017-09-27 DIAGNOSIS — E7800 Pure hypercholesterolemia, unspecified: Secondary | ICD-10-CM | POA: Diagnosis not present

## 2017-09-27 DIAGNOSIS — R29898 Other symptoms and signs involving the musculoskeletal system: Secondary | ICD-10-CM | POA: Diagnosis not present

## 2017-09-30 DIAGNOSIS — M6281 Muscle weakness (generalized): Secondary | ICD-10-CM | POA: Diagnosis not present

## 2017-09-30 DIAGNOSIS — S7001XD Contusion of right hip, subsequent encounter: Secondary | ICD-10-CM | POA: Diagnosis not present

## 2017-09-30 DIAGNOSIS — M069 Rheumatoid arthritis, unspecified: Secondary | ICD-10-CM | POA: Diagnosis not present

## 2017-09-30 DIAGNOSIS — E119 Type 2 diabetes mellitus without complications: Secondary | ICD-10-CM | POA: Diagnosis not present

## 2017-09-30 DIAGNOSIS — M160 Bilateral primary osteoarthritis of hip: Secondary | ICD-10-CM | POA: Diagnosis not present

## 2017-09-30 DIAGNOSIS — M4800 Spinal stenosis, site unspecified: Secondary | ICD-10-CM | POA: Diagnosis not present

## 2017-10-03 DIAGNOSIS — M6281 Muscle weakness (generalized): Secondary | ICD-10-CM | POA: Diagnosis not present

## 2017-10-03 DIAGNOSIS — M810 Age-related osteoporosis without current pathological fracture: Secondary | ICD-10-CM | POA: Diagnosis not present

## 2017-10-03 DIAGNOSIS — E43 Unspecified severe protein-calorie malnutrition: Secondary | ICD-10-CM | POA: Diagnosis not present

## 2017-10-03 DIAGNOSIS — K5790 Diverticulosis of intestine, part unspecified, without perforation or abscess without bleeding: Secondary | ICD-10-CM | POA: Diagnosis not present

## 2017-10-03 DIAGNOSIS — E119 Type 2 diabetes mellitus without complications: Secondary | ICD-10-CM | POA: Diagnosis not present

## 2017-10-03 DIAGNOSIS — M160 Bilateral primary osteoarthritis of hip: Secondary | ICD-10-CM | POA: Diagnosis not present

## 2017-10-03 DIAGNOSIS — M069 Rheumatoid arthritis, unspecified: Secondary | ICD-10-CM | POA: Diagnosis not present

## 2017-10-03 DIAGNOSIS — M4800 Spinal stenosis, site unspecified: Secondary | ICD-10-CM | POA: Diagnosis not present

## 2017-10-03 DIAGNOSIS — I10 Essential (primary) hypertension: Secondary | ICD-10-CM | POA: Diagnosis not present

## 2017-10-03 DIAGNOSIS — F419 Anxiety disorder, unspecified: Secondary | ICD-10-CM | POA: Diagnosis not present

## 2017-10-03 DIAGNOSIS — S72002D Fracture of unspecified part of neck of left femur, subsequent encounter for closed fracture with routine healing: Secondary | ICD-10-CM | POA: Diagnosis not present

## 2017-10-03 DIAGNOSIS — Z7984 Long term (current) use of oral hypoglycemic drugs: Secondary | ICD-10-CM | POA: Diagnosis not present

## 2017-10-03 DIAGNOSIS — D649 Anemia, unspecified: Secondary | ICD-10-CM | POA: Diagnosis not present

## 2017-10-03 DIAGNOSIS — F329 Major depressive disorder, single episode, unspecified: Secondary | ICD-10-CM | POA: Diagnosis not present

## 2017-10-03 DIAGNOSIS — Z9181 History of falling: Secondary | ICD-10-CM | POA: Diagnosis not present

## 2017-10-07 DIAGNOSIS — R358 Other polyuria: Secondary | ICD-10-CM | POA: Diagnosis not present

## 2017-10-07 DIAGNOSIS — M069 Rheumatoid arthritis, unspecified: Secondary | ICD-10-CM | POA: Diagnosis not present

## 2017-10-07 DIAGNOSIS — K5790 Diverticulosis of intestine, part unspecified, without perforation or abscess without bleeding: Secondary | ICD-10-CM | POA: Diagnosis not present

## 2017-10-07 DIAGNOSIS — M6281 Muscle weakness (generalized): Secondary | ICD-10-CM | POA: Diagnosis not present

## 2017-10-07 DIAGNOSIS — N390 Urinary tract infection, site not specified: Secondary | ICD-10-CM | POA: Diagnosis not present

## 2017-10-07 DIAGNOSIS — S72002D Fracture of unspecified part of neck of left femur, subsequent encounter for closed fracture with routine healing: Secondary | ICD-10-CM | POA: Diagnosis not present

## 2017-10-07 DIAGNOSIS — E119 Type 2 diabetes mellitus without complications: Secondary | ICD-10-CM | POA: Diagnosis not present

## 2017-10-07 DIAGNOSIS — M160 Bilateral primary osteoarthritis of hip: Secondary | ICD-10-CM | POA: Diagnosis not present

## 2017-10-11 DIAGNOSIS — M160 Bilateral primary osteoarthritis of hip: Secondary | ICD-10-CM | POA: Diagnosis not present

## 2017-10-11 DIAGNOSIS — S72002D Fracture of unspecified part of neck of left femur, subsequent encounter for closed fracture with routine healing: Secondary | ICD-10-CM | POA: Diagnosis not present

## 2017-10-11 DIAGNOSIS — M6281 Muscle weakness (generalized): Secondary | ICD-10-CM | POA: Diagnosis not present

## 2017-10-11 DIAGNOSIS — K5790 Diverticulosis of intestine, part unspecified, without perforation or abscess without bleeding: Secondary | ICD-10-CM | POA: Diagnosis not present

## 2017-10-11 DIAGNOSIS — M069 Rheumatoid arthritis, unspecified: Secondary | ICD-10-CM | POA: Diagnosis not present

## 2017-10-11 DIAGNOSIS — E119 Type 2 diabetes mellitus without complications: Secondary | ICD-10-CM | POA: Diagnosis not present

## 2017-10-14 DIAGNOSIS — M069 Rheumatoid arthritis, unspecified: Secondary | ICD-10-CM | POA: Diagnosis not present

## 2017-10-14 DIAGNOSIS — E1149 Type 2 diabetes mellitus with other diabetic neurological complication: Secondary | ICD-10-CM | POA: Diagnosis not present

## 2017-10-14 DIAGNOSIS — S72002D Fracture of unspecified part of neck of left femur, subsequent encounter for closed fracture with routine healing: Secondary | ICD-10-CM | POA: Diagnosis not present

## 2017-10-14 DIAGNOSIS — D638 Anemia in other chronic diseases classified elsewhere: Secondary | ICD-10-CM | POA: Diagnosis not present

## 2017-10-14 DIAGNOSIS — M6281 Muscle weakness (generalized): Secondary | ICD-10-CM | POA: Diagnosis not present

## 2017-10-14 DIAGNOSIS — E119 Type 2 diabetes mellitus without complications: Secondary | ICD-10-CM | POA: Diagnosis not present

## 2017-10-14 DIAGNOSIS — M160 Bilateral primary osteoarthritis of hip: Secondary | ICD-10-CM | POA: Diagnosis not present

## 2017-10-14 DIAGNOSIS — E7800 Pure hypercholesterolemia, unspecified: Secondary | ICD-10-CM | POA: Diagnosis not present

## 2017-10-14 DIAGNOSIS — K5790 Diverticulosis of intestine, part unspecified, without perforation or abscess without bleeding: Secondary | ICD-10-CM | POA: Diagnosis not present

## 2017-10-14 DIAGNOSIS — I10 Essential (primary) hypertension: Secondary | ICD-10-CM | POA: Diagnosis not present

## 2017-10-16 DIAGNOSIS — N3090 Cystitis, unspecified without hematuria: Secondary | ICD-10-CM | POA: Diagnosis not present

## 2017-10-16 DIAGNOSIS — S72002D Fracture of unspecified part of neck of left femur, subsequent encounter for closed fracture with routine healing: Secondary | ICD-10-CM | POA: Diagnosis not present

## 2017-10-16 DIAGNOSIS — M069 Rheumatoid arthritis, unspecified: Secondary | ICD-10-CM | POA: Diagnosis not present

## 2017-10-16 DIAGNOSIS — M160 Bilateral primary osteoarthritis of hip: Secondary | ICD-10-CM | POA: Diagnosis not present

## 2017-10-16 DIAGNOSIS — N390 Urinary tract infection, site not specified: Secondary | ICD-10-CM | POA: Diagnosis not present

## 2017-10-16 DIAGNOSIS — M6281 Muscle weakness (generalized): Secondary | ICD-10-CM | POA: Diagnosis not present

## 2017-10-17 DIAGNOSIS — M160 Bilateral primary osteoarthritis of hip: Secondary | ICD-10-CM | POA: Diagnosis not present

## 2017-10-17 DIAGNOSIS — S72002D Fracture of unspecified part of neck of left femur, subsequent encounter for closed fracture with routine healing: Secondary | ICD-10-CM | POA: Diagnosis not present

## 2017-10-17 DIAGNOSIS — M069 Rheumatoid arthritis, unspecified: Secondary | ICD-10-CM | POA: Diagnosis not present

## 2017-10-17 DIAGNOSIS — M6281 Muscle weakness (generalized): Secondary | ICD-10-CM | POA: Diagnosis not present

## 2017-10-17 DIAGNOSIS — E119 Type 2 diabetes mellitus without complications: Secondary | ICD-10-CM | POA: Diagnosis not present

## 2017-10-17 DIAGNOSIS — K5790 Diverticulosis of intestine, part unspecified, without perforation or abscess without bleeding: Secondary | ICD-10-CM | POA: Diagnosis not present

## 2017-10-21 DIAGNOSIS — M069 Rheumatoid arthritis, unspecified: Secondary | ICD-10-CM | POA: Diagnosis not present

## 2017-10-21 DIAGNOSIS — M160 Bilateral primary osteoarthritis of hip: Secondary | ICD-10-CM | POA: Diagnosis not present

## 2017-10-21 DIAGNOSIS — S72002D Fracture of unspecified part of neck of left femur, subsequent encounter for closed fracture with routine healing: Secondary | ICD-10-CM | POA: Diagnosis not present

## 2017-10-21 DIAGNOSIS — K5790 Diverticulosis of intestine, part unspecified, without perforation or abscess without bleeding: Secondary | ICD-10-CM | POA: Diagnosis not present

## 2017-10-21 DIAGNOSIS — E119 Type 2 diabetes mellitus without complications: Secondary | ICD-10-CM | POA: Diagnosis not present

## 2017-10-21 DIAGNOSIS — M6281 Muscle weakness (generalized): Secondary | ICD-10-CM | POA: Diagnosis not present

## 2017-10-23 DIAGNOSIS — M6281 Muscle weakness (generalized): Secondary | ICD-10-CM | POA: Diagnosis not present

## 2017-10-23 DIAGNOSIS — E119 Type 2 diabetes mellitus without complications: Secondary | ICD-10-CM | POA: Diagnosis not present

## 2017-10-23 DIAGNOSIS — S72002D Fracture of unspecified part of neck of left femur, subsequent encounter for closed fracture with routine healing: Secondary | ICD-10-CM | POA: Diagnosis not present

## 2017-10-23 DIAGNOSIS — M069 Rheumatoid arthritis, unspecified: Secondary | ICD-10-CM | POA: Diagnosis not present

## 2017-10-23 DIAGNOSIS — M160 Bilateral primary osteoarthritis of hip: Secondary | ICD-10-CM | POA: Diagnosis not present

## 2017-10-23 DIAGNOSIS — K5790 Diverticulosis of intestine, part unspecified, without perforation or abscess without bleeding: Secondary | ICD-10-CM | POA: Diagnosis not present

## 2017-10-24 DIAGNOSIS — N390 Urinary tract infection, site not specified: Secondary | ICD-10-CM | POA: Diagnosis not present

## 2017-10-24 DIAGNOSIS — H6192 Disorder of left external ear, unspecified: Secondary | ICD-10-CM | POA: Diagnosis not present

## 2017-10-24 DIAGNOSIS — E7800 Pure hypercholesterolemia, unspecified: Secondary | ICD-10-CM | POA: Diagnosis not present

## 2017-10-24 DIAGNOSIS — E1149 Type 2 diabetes mellitus with other diabetic neurological complication: Secondary | ICD-10-CM | POA: Diagnosis not present

## 2017-10-24 DIAGNOSIS — D509 Iron deficiency anemia, unspecified: Secondary | ICD-10-CM | POA: Diagnosis not present

## 2017-10-24 DIAGNOSIS — I10 Essential (primary) hypertension: Secondary | ICD-10-CM | POA: Diagnosis not present

## 2017-10-25 DIAGNOSIS — N390 Urinary tract infection, site not specified: Secondary | ICD-10-CM | POA: Diagnosis not present

## 2017-10-25 DIAGNOSIS — R358 Other polyuria: Secondary | ICD-10-CM | POA: Diagnosis not present

## 2017-10-28 DIAGNOSIS — M069 Rheumatoid arthritis, unspecified: Secondary | ICD-10-CM | POA: Diagnosis not present

## 2017-10-28 DIAGNOSIS — M160 Bilateral primary osteoarthritis of hip: Secondary | ICD-10-CM | POA: Diagnosis not present

## 2017-10-28 DIAGNOSIS — E119 Type 2 diabetes mellitus without complications: Secondary | ICD-10-CM | POA: Diagnosis not present

## 2017-10-28 DIAGNOSIS — M6281 Muscle weakness (generalized): Secondary | ICD-10-CM | POA: Diagnosis not present

## 2017-10-28 DIAGNOSIS — S72002D Fracture of unspecified part of neck of left femur, subsequent encounter for closed fracture with routine healing: Secondary | ICD-10-CM | POA: Diagnosis not present

## 2017-10-28 DIAGNOSIS — K5790 Diverticulosis of intestine, part unspecified, without perforation or abscess without bleeding: Secondary | ICD-10-CM | POA: Diagnosis not present

## 2017-10-30 DIAGNOSIS — D509 Iron deficiency anemia, unspecified: Secondary | ICD-10-CM | POA: Diagnosis not present

## 2017-10-30 DIAGNOSIS — L89151 Pressure ulcer of sacral region, stage 1: Secondary | ICD-10-CM | POA: Diagnosis not present

## 2017-10-30 DIAGNOSIS — E639 Nutritional deficiency, unspecified: Secondary | ICD-10-CM | POA: Diagnosis not present

## 2017-10-31 ENCOUNTER — Encounter: Payer: Self-pay | Admitting: Podiatry

## 2017-10-31 ENCOUNTER — Ambulatory Visit (INDEPENDENT_AMBULATORY_CARE_PROVIDER_SITE_OTHER): Payer: Medicare Other | Admitting: Podiatry

## 2017-10-31 DIAGNOSIS — B351 Tinea unguium: Secondary | ICD-10-CM

## 2017-10-31 DIAGNOSIS — L84 Corns and callosities: Secondary | ICD-10-CM | POA: Diagnosis not present

## 2017-10-31 DIAGNOSIS — M79676 Pain in unspecified toe(s): Secondary | ICD-10-CM

## 2017-10-31 DIAGNOSIS — E1140 Type 2 diabetes mellitus with diabetic neuropathy, unspecified: Secondary | ICD-10-CM

## 2017-11-04 DIAGNOSIS — S72002D Fracture of unspecified part of neck of left femur, subsequent encounter for closed fracture with routine healing: Secondary | ICD-10-CM | POA: Diagnosis not present

## 2017-11-04 DIAGNOSIS — K5790 Diverticulosis of intestine, part unspecified, without perforation or abscess without bleeding: Secondary | ICD-10-CM | POA: Diagnosis not present

## 2017-11-04 DIAGNOSIS — M069 Rheumatoid arthritis, unspecified: Secondary | ICD-10-CM | POA: Diagnosis not present

## 2017-11-04 DIAGNOSIS — M160 Bilateral primary osteoarthritis of hip: Secondary | ICD-10-CM | POA: Diagnosis not present

## 2017-11-04 DIAGNOSIS — M6281 Muscle weakness (generalized): Secondary | ICD-10-CM | POA: Diagnosis not present

## 2017-11-04 DIAGNOSIS — E119 Type 2 diabetes mellitus without complications: Secondary | ICD-10-CM | POA: Diagnosis not present

## 2017-11-04 NOTE — Progress Notes (Signed)
Subjective: 84 year old female presents the office today she has areas of irritation to her toes.  She states that she is starting to notice some areas of skin breakdown but denies any drainage or pus or any increase in swelling or redness to her feet.  No recent injury or trauma.  Also her nails are thick and elongated causing irritation side of her shoes.  She does not think of diabetic shoes that she has are fitting appropriately.  She has no new concerns today otherwise. Denies any systemic complaints such as fevers, chills, nausea, vomiting. No acute changes since last appointment, and no other complaints at this time.   Objective: AAO x3, NAD DP/PT pulses palpable bilaterally, CRT less than 3 seconds Multiple chronic digital deformities are present or significant HAV present.  On the medial aspect of the first metatarsal head of the left foot there is a pre-ulcerative area.  There is also multiple pre-ulcerative lesions present on the digits from where there is rubbing.  Small scabs are present of the toes but there is no drainage or pus there is no surrounding erythema, ascending cellulitis.  There is no clinical signs of infection noted.  There is in the toes are very small however the medial left first metatarsal head has the largest area but again there is no signs of infection present.  The nails appear to be hypertrophic, dystrophic, discolored and there is tenderness nails 1-5 bilaterally except for left second toe.  No open lesions or pre-ulcerative lesions.  No pain with calf compression, swelling, warmth, erythema  Assessment: Pre-ulcerative areas bilateral feet due to digital deformity, onychomycosis which is symptomatic  Plan: -All treatment options discussed with the patient including all alternatives, risks, complications.  -Regards to the pre-ulcerative areas on the past offload the area.  She is also wearing a shoe today that is narrow.  Discussed that she is to wear wider shoe.   I would get her into see Betha to get molded for new diabetic shoes and inserts but in the meantime I want her to wear a wider style shoe.  There is no infection today her to continue to monitor closely call the office should any occur. -Nails sharply debrided x9 without any complications or bleeding -Patient encouraged to call the office with any questions, concerns, change in symptoms.   Trula Slade DPM

## 2017-11-05 ENCOUNTER — Other Ambulatory Visit: Payer: Medicare Other

## 2017-11-07 DIAGNOSIS — I10 Essential (primary) hypertension: Secondary | ICD-10-CM | POA: Diagnosis not present

## 2017-11-07 DIAGNOSIS — D509 Iron deficiency anemia, unspecified: Secondary | ICD-10-CM | POA: Diagnosis not present

## 2017-11-07 DIAGNOSIS — M0579 Rheumatoid arthritis with rheumatoid factor of multiple sites without organ or systems involvement: Secondary | ICD-10-CM | POA: Diagnosis not present

## 2017-11-07 DIAGNOSIS — E119 Type 2 diabetes mellitus without complications: Secondary | ICD-10-CM | POA: Diagnosis not present

## 2017-11-07 DIAGNOSIS — E7800 Pure hypercholesterolemia, unspecified: Secondary | ICD-10-CM | POA: Diagnosis not present

## 2017-11-07 DIAGNOSIS — E11621 Type 2 diabetes mellitus with foot ulcer: Secondary | ICD-10-CM | POA: Diagnosis not present

## 2017-11-07 DIAGNOSIS — E1149 Type 2 diabetes mellitus with other diabetic neurological complication: Secondary | ICD-10-CM | POA: Diagnosis not present

## 2017-11-07 DIAGNOSIS — E039 Hypothyroidism, unspecified: Secondary | ICD-10-CM | POA: Diagnosis not present

## 2017-11-07 DIAGNOSIS — M810 Age-related osteoporosis without current pathological fracture: Secondary | ICD-10-CM | POA: Diagnosis not present

## 2017-11-11 DIAGNOSIS — K5790 Diverticulosis of intestine, part unspecified, without perforation or abscess without bleeding: Secondary | ICD-10-CM | POA: Diagnosis not present

## 2017-11-11 DIAGNOSIS — M069 Rheumatoid arthritis, unspecified: Secondary | ICD-10-CM | POA: Diagnosis not present

## 2017-11-11 DIAGNOSIS — E119 Type 2 diabetes mellitus without complications: Secondary | ICD-10-CM | POA: Diagnosis not present

## 2017-11-11 DIAGNOSIS — M6281 Muscle weakness (generalized): Secondary | ICD-10-CM | POA: Diagnosis not present

## 2017-11-11 DIAGNOSIS — S72002D Fracture of unspecified part of neck of left femur, subsequent encounter for closed fracture with routine healing: Secondary | ICD-10-CM | POA: Diagnosis not present

## 2017-11-11 DIAGNOSIS — M160 Bilateral primary osteoarthritis of hip: Secondary | ICD-10-CM | POA: Diagnosis not present

## 2017-11-13 DIAGNOSIS — M160 Bilateral primary osteoarthritis of hip: Secondary | ICD-10-CM | POA: Diagnosis not present

## 2017-11-13 DIAGNOSIS — K5790 Diverticulosis of intestine, part unspecified, without perforation or abscess without bleeding: Secondary | ICD-10-CM | POA: Diagnosis not present

## 2017-11-13 DIAGNOSIS — R739 Hyperglycemia, unspecified: Secondary | ICD-10-CM | POA: Diagnosis not present

## 2017-11-13 DIAGNOSIS — S72002D Fracture of unspecified part of neck of left femur, subsequent encounter for closed fracture with routine healing: Secondary | ICD-10-CM | POA: Diagnosis not present

## 2017-11-13 DIAGNOSIS — M6281 Muscle weakness (generalized): Secondary | ICD-10-CM | POA: Diagnosis not present

## 2017-11-13 DIAGNOSIS — M069 Rheumatoid arthritis, unspecified: Secondary | ICD-10-CM | POA: Diagnosis not present

## 2017-11-13 DIAGNOSIS — E108 Type 1 diabetes mellitus with unspecified complications: Secondary | ICD-10-CM | POA: Diagnosis not present

## 2017-11-13 DIAGNOSIS — E119 Type 2 diabetes mellitus without complications: Secondary | ICD-10-CM | POA: Diagnosis not present

## 2017-11-20 DIAGNOSIS — E119 Type 2 diabetes mellitus without complications: Secondary | ICD-10-CM | POA: Diagnosis not present

## 2017-11-20 DIAGNOSIS — N390 Urinary tract infection, site not specified: Secondary | ICD-10-CM | POA: Diagnosis not present

## 2017-11-20 DIAGNOSIS — S72002D Fracture of unspecified part of neck of left femur, subsequent encounter for closed fracture with routine healing: Secondary | ICD-10-CM | POA: Diagnosis not present

## 2017-11-20 DIAGNOSIS — K5790 Diverticulosis of intestine, part unspecified, without perforation or abscess without bleeding: Secondary | ICD-10-CM | POA: Diagnosis not present

## 2017-11-20 DIAGNOSIS — M160 Bilateral primary osteoarthritis of hip: Secondary | ICD-10-CM | POA: Diagnosis not present

## 2017-11-20 DIAGNOSIS — M069 Rheumatoid arthritis, unspecified: Secondary | ICD-10-CM | POA: Diagnosis not present

## 2017-11-20 DIAGNOSIS — R410 Disorientation, unspecified: Secondary | ICD-10-CM | POA: Diagnosis not present

## 2017-11-20 DIAGNOSIS — M6281 Muscle weakness (generalized): Secondary | ICD-10-CM | POA: Diagnosis not present

## 2017-11-21 DIAGNOSIS — K5790 Diverticulosis of intestine, part unspecified, without perforation or abscess without bleeding: Secondary | ICD-10-CM | POA: Diagnosis not present

## 2017-11-21 DIAGNOSIS — M069 Rheumatoid arthritis, unspecified: Secondary | ICD-10-CM | POA: Diagnosis not present

## 2017-11-21 DIAGNOSIS — M160 Bilateral primary osteoarthritis of hip: Secondary | ICD-10-CM | POA: Diagnosis not present

## 2017-11-21 DIAGNOSIS — E119 Type 2 diabetes mellitus without complications: Secondary | ICD-10-CM | POA: Diagnosis not present

## 2017-11-21 DIAGNOSIS — M6281 Muscle weakness (generalized): Secondary | ICD-10-CM | POA: Diagnosis not present

## 2017-11-21 DIAGNOSIS — S72002D Fracture of unspecified part of neck of left femur, subsequent encounter for closed fracture with routine healing: Secondary | ICD-10-CM | POA: Diagnosis not present

## 2017-11-22 DIAGNOSIS — M6281 Muscle weakness (generalized): Secondary | ICD-10-CM | POA: Diagnosis not present

## 2017-11-22 DIAGNOSIS — K5790 Diverticulosis of intestine, part unspecified, without perforation or abscess without bleeding: Secondary | ICD-10-CM | POA: Diagnosis not present

## 2017-11-22 DIAGNOSIS — S72002D Fracture of unspecified part of neck of left femur, subsequent encounter for closed fracture with routine healing: Secondary | ICD-10-CM | POA: Diagnosis not present

## 2017-11-22 DIAGNOSIS — E119 Type 2 diabetes mellitus without complications: Secondary | ICD-10-CM | POA: Diagnosis not present

## 2017-11-22 DIAGNOSIS — M160 Bilateral primary osteoarthritis of hip: Secondary | ICD-10-CM | POA: Diagnosis not present

## 2017-11-22 DIAGNOSIS — M069 Rheumatoid arthritis, unspecified: Secondary | ICD-10-CM | POA: Diagnosis not present

## 2017-11-26 DIAGNOSIS — K5790 Diverticulosis of intestine, part unspecified, without perforation or abscess without bleeding: Secondary | ICD-10-CM | POA: Diagnosis not present

## 2017-11-26 DIAGNOSIS — E119 Type 2 diabetes mellitus without complications: Secondary | ICD-10-CM | POA: Diagnosis not present

## 2017-11-26 DIAGNOSIS — M069 Rheumatoid arthritis, unspecified: Secondary | ICD-10-CM | POA: Diagnosis not present

## 2017-11-26 DIAGNOSIS — S72002D Fracture of unspecified part of neck of left femur, subsequent encounter for closed fracture with routine healing: Secondary | ICD-10-CM | POA: Diagnosis not present

## 2017-11-26 DIAGNOSIS — M6281 Muscle weakness (generalized): Secondary | ICD-10-CM | POA: Diagnosis not present

## 2017-11-26 DIAGNOSIS — M160 Bilateral primary osteoarthritis of hip: Secondary | ICD-10-CM | POA: Diagnosis not present

## 2017-11-28 DIAGNOSIS — E119 Type 2 diabetes mellitus without complications: Secondary | ICD-10-CM | POA: Diagnosis not present

## 2017-11-28 DIAGNOSIS — K5790 Diverticulosis of intestine, part unspecified, without perforation or abscess without bleeding: Secondary | ICD-10-CM | POA: Diagnosis not present

## 2017-11-28 DIAGNOSIS — S72002D Fracture of unspecified part of neck of left femur, subsequent encounter for closed fracture with routine healing: Secondary | ICD-10-CM | POA: Diagnosis not present

## 2017-11-28 DIAGNOSIS — M160 Bilateral primary osteoarthritis of hip: Secondary | ICD-10-CM | POA: Diagnosis not present

## 2017-11-28 DIAGNOSIS — M6281 Muscle weakness (generalized): Secondary | ICD-10-CM | POA: Diagnosis not present

## 2017-11-28 DIAGNOSIS — M069 Rheumatoid arthritis, unspecified: Secondary | ICD-10-CM | POA: Diagnosis not present

## 2017-11-29 ENCOUNTER — Inpatient Hospital Stay (HOSPITAL_COMMUNITY): Payer: Medicare Other

## 2017-11-29 ENCOUNTER — Ambulatory Visit: Payer: Medicare Other | Admitting: Podiatry

## 2017-11-29 ENCOUNTER — Other Ambulatory Visit: Payer: Self-pay

## 2017-11-29 ENCOUNTER — Other Ambulatory Visit (HOSPITAL_COMMUNITY): Payer: Medicare Other

## 2017-11-29 ENCOUNTER — Inpatient Hospital Stay (HOSPITAL_COMMUNITY)
Admission: EM | Admit: 2017-11-29 | Discharge: 2017-11-30 | DRG: 377 | Disposition: A | Discharge status: Home / Self Care | Payer: Medicare Other | Attending: Internal Medicine | Admitting: Internal Medicine

## 2017-11-29 ENCOUNTER — Encounter (HOSPITAL_COMMUNITY): Payer: Self-pay | Admitting: Emergency Medicine

## 2017-11-29 DIAGNOSIS — Z7983 Long term (current) use of bisphosphonates: Secondary | ICD-10-CM | POA: Diagnosis not present

## 2017-11-29 DIAGNOSIS — I350 Nonrheumatic aortic (valve) stenosis: Secondary | ICD-10-CM | POA: Diagnosis present

## 2017-11-29 DIAGNOSIS — I340 Nonrheumatic mitral (valve) insufficiency: Secondary | ICD-10-CM | POA: Diagnosis not present

## 2017-11-29 DIAGNOSIS — Z681 Body mass index (BMI) 19 or less, adult: Secondary | ICD-10-CM

## 2017-11-29 DIAGNOSIS — Z90711 Acquired absence of uterus with remaining cervical stump: Secondary | ICD-10-CM

## 2017-11-29 DIAGNOSIS — D649 Anemia, unspecified: Secondary | ICD-10-CM | POA: Diagnosis present

## 2017-11-29 DIAGNOSIS — E43 Unspecified severe protein-calorie malnutrition: Secondary | ICD-10-CM | POA: Diagnosis not present

## 2017-11-29 DIAGNOSIS — D62 Acute posthemorrhagic anemia: Secondary | ICD-10-CM | POA: Diagnosis not present

## 2017-11-29 DIAGNOSIS — Z7952 Long term (current) use of systemic steroids: Secondary | ICD-10-CM | POA: Diagnosis not present

## 2017-11-29 DIAGNOSIS — K219 Gastro-esophageal reflux disease without esophagitis: Secondary | ICD-10-CM | POA: Diagnosis present

## 2017-11-29 DIAGNOSIS — K5790 Diverticulosis of intestine, part unspecified, without perforation or abscess without bleeding: Secondary | ICD-10-CM | POA: Diagnosis not present

## 2017-11-29 DIAGNOSIS — K922 Gastrointestinal hemorrhage, unspecified: Secondary | ICD-10-CM | POA: Diagnosis not present

## 2017-11-29 DIAGNOSIS — R000 Tachycardia, unspecified: Secondary | ICD-10-CM | POA: Diagnosis not present

## 2017-11-29 DIAGNOSIS — R42 Dizziness and giddiness: Secondary | ICD-10-CM | POA: Diagnosis not present

## 2017-11-29 DIAGNOSIS — E785 Hyperlipidemia, unspecified: Secondary | ICD-10-CM | POA: Diagnosis present

## 2017-11-29 DIAGNOSIS — Z7984 Long term (current) use of oral hypoglycemic drugs: Secondary | ICD-10-CM

## 2017-11-29 DIAGNOSIS — R55 Syncope and collapse: Secondary | ICD-10-CM | POA: Diagnosis not present

## 2017-11-29 DIAGNOSIS — E039 Hypothyroidism, unspecified: Secondary | ICD-10-CM | POA: Diagnosis present

## 2017-11-29 DIAGNOSIS — M069 Rheumatoid arthritis, unspecified: Secondary | ICD-10-CM | POA: Diagnosis present

## 2017-11-29 DIAGNOSIS — I10 Essential (primary) hypertension: Secondary | ICD-10-CM | POA: Diagnosis present

## 2017-11-29 DIAGNOSIS — Z79890 Hormone replacement therapy (postmenopausal): Secondary | ICD-10-CM

## 2017-11-29 DIAGNOSIS — K5731 Diverticulosis of large intestine without perforation or abscess with bleeding: Principal | ICD-10-CM | POA: Diagnosis present

## 2017-11-29 DIAGNOSIS — Z86010 Personal history of colonic polyps: Secondary | ICD-10-CM

## 2017-11-29 DIAGNOSIS — Z66 Do not resuscitate: Secondary | ICD-10-CM | POA: Diagnosis not present

## 2017-11-29 DIAGNOSIS — K625 Hemorrhage of anus and rectum: Secondary | ICD-10-CM | POA: Diagnosis present

## 2017-11-29 DIAGNOSIS — Z993 Dependence on wheelchair: Secondary | ICD-10-CM

## 2017-11-29 DIAGNOSIS — R404 Transient alteration of awareness: Secondary | ICD-10-CM | POA: Diagnosis not present

## 2017-11-29 DIAGNOSIS — M05742 Rheumatoid arthritis with rheumatoid factor of left hand without organ or systems involvement: Secondary | ICD-10-CM

## 2017-11-29 DIAGNOSIS — Z79899 Other long term (current) drug therapy: Secondary | ICD-10-CM

## 2017-11-29 DIAGNOSIS — E1169 Type 2 diabetes mellitus with other specified complication: Secondary | ICD-10-CM | POA: Diagnosis present

## 2017-11-29 DIAGNOSIS — M05741 Rheumatoid arthritis with rheumatoid factor of right hand without organ or systems involvement: Secondary | ICD-10-CM | POA: Diagnosis present

## 2017-11-29 DIAGNOSIS — Z8719 Personal history of other diseases of the digestive system: Secondary | ICD-10-CM

## 2017-11-29 DIAGNOSIS — K921 Melena: Secondary | ICD-10-CM | POA: Diagnosis not present

## 2017-11-29 HISTORY — DX: Urinary tract infection, site not specified: N39.0

## 2017-11-29 HISTORY — DX: Personal history of other medical treatment: Z92.89

## 2017-11-29 HISTORY — DX: Type 2 diabetes mellitus without complications: E11.9

## 2017-11-29 HISTORY — DX: Rheumatoid arthritis, unspecified: M06.9

## 2017-11-29 HISTORY — DX: Unspecified atrial fibrillation: I48.91

## 2017-11-29 LAB — ECHOCARDIOGRAM COMPLETE
Height: 65 in
Weight: 1414.47 oz

## 2017-11-29 LAB — COMPREHENSIVE METABOLIC PANEL
ALBUMIN: 2.8 g/dL — AB (ref 3.5–5.0)
ALT: 14 U/L (ref 14–54)
AST: 19 U/L (ref 15–41)
Alkaline Phosphatase: 86 U/L (ref 38–126)
Anion gap: 10 (ref 5–15)
BILIRUBIN TOTAL: 0.6 mg/dL (ref 0.3–1.2)
BUN: 15 mg/dL (ref 6–20)
CO2: 26 mmol/L (ref 22–32)
Calcium: 8.3 mg/dL — ABNORMAL LOW (ref 8.9–10.3)
Chloride: 102 mmol/L (ref 101–111)
Creatinine, Ser: 0.48 mg/dL (ref 0.44–1.00)
GFR calc Af Amer: >60 mL/min (ref >60)
GFR calc non Af Amer: >60 mL/min (ref >60)
GLUCOSE: 274 mg/dL — AB (ref 65–99)
Potassium: 4 mmol/L (ref 3.5–5.1)
Sodium: 138 mmol/L (ref 135–145)
TOTAL PROTEIN: 5.9 g/dL — AB (ref 6.5–8.1)

## 2017-11-29 LAB — CBC
HCT: 29.7 % — ABNORMAL LOW (ref 36.0–46.0)
HEMATOCRIT: 28.9 % — AB (ref 36.0–46.0)
HEMATOCRIT: 25.1 % — AB (ref 36.0–46.0)
HEMOGLOBIN: 8.8 g/dL — AB (ref 12.0–15.0)
HEMOGLOBIN: 7.5 g/dL — AB (ref 12.0–15.0)
Hemoglobin: 8.8 g/dL — ABNORMAL LOW (ref 12.0–15.0)
MCH: 23.1 pg — ABNORMAL LOW (ref 26.0–34.0)
MCH: 23.8 pg — ABNORMAL LOW (ref 26.0–34.0)
MCH: 23.1 pg — AB (ref 26.0–34.0)
MCHC: 29.6 g/dL — AB (ref 30.0–36.0)
MCHC: 30.4 g/dL (ref 30.0–36.0)
MCHC: 29.9 g/dL — ABNORMAL LOW (ref 30.0–36.0)
MCV: 78 fL (ref 78.0–100.0)
MCV: 78.1 fL (ref 78.0–100.0)
MCV: 77.5 fL — AB (ref 78.0–100.0)
Platelets: 365 10*3/uL (ref 150–400)
Platelets: 311 10*3/uL (ref 150–400)
Platelets: 325 10*3/uL (ref 150–400)
RBC: 3.81 MIL/uL — ABNORMAL LOW (ref 3.87–5.11)
RBC: 3.7 MIL/uL — ABNORMAL LOW (ref 3.87–5.11)
RBC: 3.24 MIL/uL — AB (ref 3.87–5.11)
RDW: 17.1 % — AB (ref 11.5–15.5)
RDW: 17.5 % — ABNORMAL HIGH (ref 11.5–15.5)
RDW: 17 % — ABNORMAL HIGH (ref 11.5–15.5)
WBC: 12.5 10*3/uL — ABNORMAL HIGH (ref 4.0–10.5)
WBC: 11.1 10*3/uL — AB (ref 4.0–10.5)
WBC: 9.7 10*3/uL (ref 4.0–10.5)

## 2017-11-29 LAB — TYPE AND SCREEN
ABO/RH(D): O POS
ANTIBODY SCREEN: NEGATIVE

## 2017-11-29 LAB — POC OCCULT BLOOD, ED: Fecal Occult Bld: POSITIVE — AB

## 2017-11-29 LAB — PROTIME-INR
INR: 1.05
Prothrombin Time: 13.6 seconds (ref 11.4–15.2)

## 2017-11-29 LAB — ABO/RH: ABO/RH(D): O POS

## 2017-11-29 MED ORDER — SODIUM CHLORIDE 0.9 % IV SOLN
INTRAVENOUS | Status: DC
  Administered 2017-11-29 – 2017-11-30 (×2): via INTRAVENOUS

## 2017-11-29 MED ORDER — ALUM & MAG HYDROXIDE-SIMETH 200-200-20 MG/5ML PO SUSP: 30.0000 mL | Freq: Four times a day (QID) | ORAL | Status: DC | PRN

## 2017-11-29 MED ORDER — SODIUM CHLORIDE 0.9% FLUSH
3.0000 mL | Freq: Two times a day (BID) | INTRAVENOUS | Status: DC
  Administered 2017-11-30: 3 mL via INTRAVENOUS

## 2017-11-29 MED ORDER — ALENDRONATE SODIUM 70 MG PO TABS: 70.0000 mg | ORAL_TABLET | ORAL | Status: DC

## 2017-11-29 MED ORDER — PANTOPRAZOLE SODIUM 40 MG PO TBEC
40.0000 mg | DELAYED_RELEASE_TABLET | Freq: Every day | ORAL | Status: DC
  Administered 2017-11-29 – 2017-11-30 (×2): 40 mg via ORAL
  Filled 2017-11-29 (×3): qty 1

## 2017-11-29 MED ORDER — FOLIC ACID 1 MG PO TABS
1.0000 mg | ORAL_TABLET | Freq: Every day | ORAL | Status: DC
  Administered 2017-11-29 – 2017-11-30 (×2): 1 mg via ORAL
  Filled 2017-11-29 (×3): qty 1

## 2017-11-29 MED ORDER — PREDNISONE 1 MG PO TABS
4.0000 mg | ORAL_TABLET | Freq: Every day | ORAL | Status: DC
  Administered 2017-11-30: 4 mg via ORAL
  Filled 2017-11-29: qty 4

## 2017-11-29 MED ORDER — PRAVASTATIN SODIUM 40 MG PO TABS
40.0000 mg | ORAL_TABLET | Freq: Every day | ORAL | Status: DC
  Administered 2017-11-29: 40 mg via ORAL
  Filled 2017-11-29 (×2): qty 1

## 2017-11-29 MED ORDER — BOOST / RESOURCE BREEZE PO LIQD CUSTOM
1.0000 | Freq: Three times a day (TID) | ORAL | Status: DC
  Administered 2017-11-29 – 2017-11-30 (×3): 1 via ORAL

## 2017-11-29 MED ORDER — LEVOTHYROXINE SODIUM 150 MCG PO TABS
150.0000 ug | ORAL_TABLET | Freq: Every day | ORAL | Status: DC
  Administered 2017-11-30: 150 ug via ORAL
  Filled 2017-11-29: qty 1
  Filled 2017-11-29: qty 2

## 2017-11-29 MED ORDER — LEFLUNOMIDE 20 MG PO TABS
20.0000 mg | ORAL_TABLET | Freq: Every day | ORAL | Status: DC
  Administered 2017-11-29 – 2017-11-30 (×2): 20 mg via ORAL
  Filled 2017-11-29 (×2): qty 1

## 2017-11-29 MED ORDER — TRAZODONE HCL 50 MG PO TABS
25.0000 mg | ORAL_TABLET | Freq: Every evening | ORAL | Status: DC | PRN
Start: 2017-11-29 — End: 2017-11-30
  Administered 2017-11-29: 25 mg via ORAL
  Filled 2017-11-29: qty 1

## 2017-11-29 MED ORDER — ONDANSETRON HCL 4 MG/2ML IJ SOLN: 4.0000 mg | Freq: Four times a day (QID) | INTRAMUSCULAR | Status: DC | PRN

## 2017-11-29 MED ORDER — ONDANSETRON HCL 4 MG PO TABS: 4.0000 mg | ORAL_TABLET | Freq: Four times a day (QID) | ORAL | Status: DC | PRN

## 2017-11-29 MED ORDER — IBUPROFEN 400 MG PO TABS: 400.0000 mg | ORAL_TABLET | ORAL | Status: DC | PRN

## 2017-11-29 MED ORDER — DICLOFENAC SODIUM 1 % TD GEL
2.0000 g | Freq: Four times a day (QID) | TRANSDERMAL | Status: DC
  Administered 2017-11-29 – 2017-11-30 (×3): 2 g via TOPICAL
  Filled 2017-11-29: qty 100

## 2017-11-29 NOTE — Progress Notes (Signed)
  Echocardiogram 2D Echocardiogram has been performed.  [name] 11/29/2017, 3:41 PM

## 2017-11-29 NOTE — ED Notes (Signed)
Pt could not stand for orthostatic vitals. She stated she is too weak and when attemping to stand she was unable to do so.

## 2017-11-29 NOTE — ED Triage Notes (Signed)
Pt from home lives by herself with her dog and day nursing care had episode of dark red bloody stool this am, has hx of GI bleed admitted last Nov to [facility], has hx of Afib , had syncopal episode on commode when ems arrived, pt O2 sats 90 on rm but her fingers are cool

## 2017-11-29 NOTE — Consult Note (Signed)
[facility] Consult  Referring Provider: Radene Gunning, NP([facility]) Primary Care Physician:  Deland Pretty, MD Primary Gastroenterologist: Althia Forts  Reason for Consultation:  Hematochezia  HPI: [name] is a 84 y.o. female presented to the ER today with complaints of dark bloody stool noted in her bed by her home health. Patient denies abdominal pain or rectal pain. After EMS was called there was a syncopal event as per documentation. Patient denies further bleeding since presentation to ER. Overall she feels weak. She reports having at least one bowel movement a day. Recently admitted in December with presumed diverticular bleeding which resolved. Prior colonoscopy from 2003 and 2009 showed diverticulosis from sigmoid to ascending colon. Hb on presentation was 8.8, repeat hemoglobin remains 8.8, hemoglobin of 8.5 on discharge in December.  Past Medical History:  Diagnosis Date  . Acute bronchitis   . Anemia, unspecified   . Depressive disorder, not elsewhere classified   . Diverticulosis of colon (without mention of hemorrhage)   . Esophageal reflux   . Family history of malignant neoplasm of gastrointestinal tract   . Functional diarrhea   . Hiatal hernia   . Other and unspecified hyperlipidemia   . Other chest pain   . Other specified cardiac dysrhythmias(427.89)   . Personal history of colonic polyps 07/17/1995   adenomatous polyps  . PONV (postoperative nausea and vomiting)   . Rheumatoid arthritis(714.0)   . Tachycardia, unspecified   . Type II or unspecified type diabetes mellitus with other specified manifestations, not stated as uncontrolled   . Unspecified adverse effect of unspecified drug, medicinal and biological substance   . Unspecified essential hypertension   . Unspecified hypothyroidism     Past Surgical History:  Procedure Laterality Date  . CATARACT EXTRACTION     left  . ORIF FEMUR FRACTURE  08/30/2012   Procedure:  OPEN REDUCTION INTERNAL FIXATION (ORIF) DISTAL FEMUR FRACTURE;  Surgeon: Mauri Pole, MD;  Location: [facility];  Service: Orthopedics;  Laterality: Right;  proximal femur  . PARTIAL HYSTERECTOMY    . THYROIDECTOMY, PARTIAL      Prior to Admission medications   Medication Sig Start Date End Date Taking? Authorizing Provider  acetaminophen (TYLENOL) 325 MG tablet Take 650 mg by mouth daily as needed.    [provider]  alendronate (FOSAMAX) 70 MG tablet Take 70 mg by mouth every Saturday.  07/15/13   [provider]  folic acid (FOLVITE) 1 MG tablet Take 1 mg by mouth daily.    [provider]  ibuprofen (ADVIL,MOTRIN) 200 MG tablet Take 400 mg by mouth 2 (two) times daily as needed.    [provider]  leflunomide (ARAVA) 20 MG tablet Take 20 mg by mouth daily. 08/14/17   [provider]  linagliptin (TRADJENTA) 5 MG TABS tablet Take 5 mg by mouth daily.    [provider]  metFORMIN (GLUCOPHAGE-XR) 500 MG 24 hr tablet Take 500-1,000 mg by mouth See admin instructions. Take 2 tablets in the morning, and 1 tablet in the evening 03/07/15   [provider]  omeprazole (PRILOSEC) 40 MG capsule Take 40 mg by mouth daily. 08/14/17   [provider]  Pitavastatin Calcium (LIVALO) 2 MG TABS Take 2 mg by mouth every Monday, Wednesday, and Friday at 6 PM.     [provider]  predniSONE (DELTASONE) 1 MG tablet Take 4 mg by mouth daily with breakfast.    [provider]  SYNTHROID 150 MCG tablet Take  150 mcg by mouth daily before breakfast.  09/30/14   [provider]  TRULICITY 4.27 CW/2.3JS SOPN Inject 0.75 mg into the skin once a week. 08/28/17   [provider]  VOLTAREN 1 % GEL Apply 2 g topically 4 (four) times daily.  11/30/13   [provider]    Current Facility-Administered Medications  Medication Dose Route Frequency Provider Last Rate Last Dose  . 0.9 %  sodium chloride infusion    Intravenous Continuous Radene Gunning, NP      . Derrill Memo ON 11/30/2017] alendronate (FOSAMAX) tablet 70 mg  70 mg Oral Q Sat Black, Karen M, NP      . alum & mag hydroxide-simeth (MAALOX/MYLANTA) 200-200-20 MG/5ML suspension 30 mL  30 mL Oral Q6H PRN Radene Gunning, NP      . diclofenac sodium (VOLTAREN) 1 % transdermal gel 2 g  2 g Topical QID Black, Karen M, NP      . folic acid (FOLVITE) tablet 1 mg  1 mg Oral Daily Black, Karen M, NP      . ibuprofen (ADVIL,MOTRIN) tablet 400 mg  400 mg Oral Q4H PRN Radene Gunning, NP      . leflunomide (ARAVA) tablet 20 mg  20 mg Oral Daily Radene Gunning, NP      . Derrill Memo ON 11/30/2017] levothyroxine (SYNTHROID, LEVOTHROID) tablet 150 mcg  150 mcg Oral QAC breakfast Black, Lezlie Octave, NP      . ondansetron [facility]) tablet 4 mg  4 mg Oral Q6H PRN Radene Gunning, NP       Or  . ondansetron [facility]) injection 4 mg  4 mg Intravenous Q6H PRN Black, Karen M, NP      . pantoprazole (PROTONIX) EC tablet 40 mg  40 mg Oral Daily Black, Karen M, NP      . pravastatin (PRAVACHOL) tablet 40 mg  40 mg Oral q1800 Radene Gunning, NP      . Derrill Memo ON 11/30/2017] predniSONE (DELTASONE) tablet 4 mg  4 mg Oral Q breakfast Black, Karen M, NP      . sodium chloride flush (NS) 0.9 % injection 3 mL  3 mL Intravenous Q12H Black, Karen M, NP      . traZODone (DESYREL) tablet 25 mg  25 mg Oral QHS PRN Radene Gunning, NP        Allergies as of 11/29/2017  . (No Known Allergies)    Family History  Problem Relation Age of Onset  . Colon cancer Maternal Grandmother     Social History   Socioeconomic History  . Marital status: Married    Spouse name: Not on file  . Number of children: 3  . Years of education: Not on file  . Highest education level: Not on file  Social Needs  . Financial resource strain: Not on file  . Food insecurity - worry: Not on file  . Food insecurity - inability: Not on file  . Transportation needs - medical: Not on file  . Transportation needs -  non-medical: Not on file  Occupational History    Employer: RETIRED  Tobacco Use  . Smoking status: Never Smoker  . Smokeless tobacco: Never Used  Substance and Sexual Activity  . Alcohol use: Yes    Comment: one glass of wine every Friday night.  . Drug use: No  . Sexual activity: No  Other Topics Concern  . Not on file  Social History Narrative  . Not on  file    Review of Systems: Positive for: GI: Described in detail in HPI.    Gen: fatigue, weakness, malaise, Denies any fever, chills, rigors, night sweats, anorexia, involuntary weight loss, and sleep disorder CV: Denies chest pain, angina, palpitations, syncope, orthopnea, PND, peripheral edema, and claudication. Resp: Denies dyspnea, cough, sputum, wheezing, coughing up blood. GU : Denies urinary burning, blood in urine, urinary frequency, urinary hesitancy, nocturnal urination, and urinary incontinence. MS: Denies joint pain or swelling.  Denies muscle weakness, cramps, atrophy.  Derm: Denies rash, itching, oral ulcerations, hives, unhealing ulcers.  Psych: Denies depression, anxiety, memory loss, suicidal ideation, hallucinations,  and confusion. Heme: Rectal bleeding, Denies bruising and enlarged lymph nodes. Neuro:  Denies any headaches, dizziness, paresthesias. Endo:  Denies any problems with DM, thyroid, adrenal function.  Physical Exam: Vital signs in last 24 hours: Temp:  [97.7 F (36.5 C)-98 F (36.7 C)] 98 F (36.7 C) (03/08 1240) Pulse Rate:  [86-108] 86 (03/08 1240) Resp:  [16-26] 26 (03/08 1130) BP: (123-129)/(55-93) 125/55 (03/08 1240) SpO2:  [98 %-100 %] 99 % (03/08 1240) Weight:  [40.1 kg (88 lb 6.5 oz)] 40.1 kg (88 lb 6.5 oz) (03/08 1240)    General:  Mechele Claude, appears stated age, pleasant and cooperative in NAD Head:  Normocephalic and atraumatic. Eyes:  Sclera clear, no icterus.  Mild pallor  Ears:  Normal auditory acuity. Nose:  No deformity, discharge,  or lesions. Mouth:  No deformity  or lesions.  Oropharynx pink & moist. Neck:  Supple; no masses or thyromegaly. Lungs:  Clear throughout to auscultation.   No wheezes, crackles, or rhonchi. No acute distress. Heart:  Regular rate and rhythm; no murmurs, clicks, rubs,  or gallops. Extremities:Multiple small joint deformities,  Without clubbing or edema. Neurologic:  Alert and  oriented x4;  grossly normal neurologically. Skin:  Intact without significant lesions or rashes. Psych:  Alert and cooperative. Normal mood and affect. Abdomen:  Soft, nontender and nondistended. No masses, hepatosplenomegaly or hernias noted. Normal bowel sounds, without guarding, and without rebound.         Lab Results: Recent Labs    11/29/17 0955 11/29/17 1325  WBC 12.5* 11.1*  HGB 8.8* 8.8*  HCT 29.7* 28.9*  PLT 365 311   BMET Recent Labs    11/29/17 0955  NA 138  K 4.0  CL 102  CO2 26  GLUCOSE 274*  BUN 15  CREATININE 0.48  CALCIUM 8.3*   LFT Recent Labs    11/29/17 0955  PROT 5.9*  ALBUMIN 2.8*  AST 19  ALT 14  ALKPHOS 86  BILITOT 0.6   PT/INR Recent Labs    11/29/17 0955  LABPROT 13.6  INR 1.05    Studies/Results: No results found.  Impression: Painless hematochezia , likely diverticular in origin. Hemoglobin remained stable at 8.8 .   Plan: Continue clear liquid diet for now. If hematochezia recurs, recommend stat bleeding scan and then IR guided embolization for presumed diverticular bleed. If no further bleeding noted, hemoglobin remained stable, plan advancing diet accordingly.   LOS: 0 days   Ronnette Juniper, M.D.  11/29/2017, 1:58 PM  Pager [phone] If no answer or after 5 PM call [phone]

## 2017-11-29 NOTE — ED Provider Notes (Signed)
[facility] EMERGENCY DEPARTMENT Provider Note   CSN: [id number] Arrival date & time: 11/29/17  0931     History   Chief Complaint Chief Complaint  Patient presents with  . Rectal Bleeding    HPI [name] is a 84 y.o. female.  Patient c/o rectal bleeding onset this AM, and had syncopal event when EMS arrived.  Patient w hx diverticula and gi bleeding. Patient w few episodes this morning, moderate, persistent, states large amount of dark blood. No abd pain. No anticoag use. Denies other abnormal bruising or bleeding. No abd pain. No fevers.    The history is provided by the patient.  Rectal Bleeding  Associated symptoms: light-headedness   Associated symptoms: no abdominal pain, no epistaxis, no fever and no vomiting     Past Medical History:  Diagnosis Date  . Acute bronchitis   . Anemia, unspecified   . Depressive disorder, not elsewhere classified   . Diverticulosis of colon (without mention of hemorrhage)   . Esophageal reflux   . Family history of malignant neoplasm of gastrointestinal tract   . Functional diarrhea   . Hiatal hernia   . Other and unspecified hyperlipidemia   . Other chest pain   . Other specified cardiac dysrhythmias(427.89)   . Personal history of colonic polyps 07/17/1995   adenomatous polyps  . PONV (postoperative nausea and vomiting)   . Rheumatoid arthritis(714.0)   . Tachycardia, unspecified   . Type II or unspecified type diabetes mellitus with other specified manifestations, not stated as uncontrolled   . Unspecified adverse effect of unspecified drug, medicinal and biological substance   . Unspecified essential hypertension   . Unspecified hypothyroidism     Patient Active Problem List   Diagnosis Date Noted  . GI bleed 09/11/2017  . Lower GI bleed 09/10/2017  . Diabetic ulcer of left foot associated with type 2 diabetes mellitus ([place]) 08/11/2015  . Closed right hip fracture ([place]) 08/28/2012  .  Rheumatoid arthritis(714.0) 08/28/2012  . History of gastroesophageal reflux (GERD) 06/26/2011  . Diverticulosis 06/26/2011  . Lower GI bleeding 06/26/2011  . DEPRESSION 03/31/2009  . DIARRHEA, FUNCTIONAL 03/31/2009  . DIABETIC HYPOGLYCEMIA, TYPE II 11/03/2008  . ANEMIA, MILD 11/03/2008  . ACUTE BRONCHITIS 08/23/2008  . AV NODAL REENTRY TACHYCARDIA 10/29/2007  . TACHYCARDIA 10/29/2007  . CHEST PAIN, ATYPICAL 10/25/2007  . ADVEF, DRUG/MEDICINAL/BIOLOGICAL SUBST NOS 07/16/2007  . Nonspecific (abnormal) findings on radiological and other examination of body structure 04/03/2007  . CHEST XRAY, ABNORMAL 04/03/2007  . Hypothyroidism 03/06/2007  . Type 2 diabetes mellitus with other specified complication ([place]) 18/29/9371  . HYPERLIPIDEMIA 03/06/2007  . Essential hypertension 03/06/2007  . DIVERTICULOSIS, COLON 03/06/2007  . Rheumatoid arthritis ([place]) 03/06/2007    Past Surgical History:  Procedure Laterality Date  . CATARACT EXTRACTION     left  . ORIF FEMUR FRACTURE  08/30/2012   Procedure: OPEN REDUCTION INTERNAL FIXATION (ORIF) DISTAL FEMUR FRACTURE;  Surgeon: Mauri Pole, MD;  Location: [facility];  Service: Orthopedics;  Laterality: Right;  proximal femur  . PARTIAL HYSTERECTOMY    . THYROIDECTOMY, PARTIAL      OB History    No data available       Home Medications    Prior to Admission medications   Medication Sig Start Date End Date Taking? Authorizing Provider  acetaminophen (TYLENOL) 325 MG tablet Take 650 mg by mouth daily as needed.    [provider]  alendronate (FOSAMAX) 70 MG tablet Take 70  mg by mouth every Saturday.  07/15/13   [provider]  folic acid (FOLVITE) 1 MG tablet Take 1 mg by mouth daily.    [provider]  ibuprofen (ADVIL,MOTRIN) 200 MG tablet Take 400 mg by mouth 2 (two) times daily as needed.    [provider]  leflunomide (ARAVA) 20 MG tablet Take 20 mg by mouth daily. 08/14/17   [provider]    linagliptin (TRADJENTA) 5 MG TABS tablet Take 5 mg by mouth daily.    [provider]  metFORMIN (GLUCOPHAGE-XR) 500 MG 24 hr tablet Take 500-1,000 mg by mouth See admin instructions. Take 2 tablets in the morning, and 1 tablet in the evening 03/07/15   [provider]  omeprazole (PRILOSEC) 40 MG capsule Take 40 mg by mouth daily. 08/14/17   [provider]  Pitavastatin Calcium (LIVALO) 2 MG TABS Take 2 mg by mouth every Monday, Wednesday, and Friday at 6 PM.     [provider]  predniSONE (DELTASONE) 1 MG tablet Take 4 mg by mouth daily with breakfast.    [provider]  SYNTHROID 150 MCG tablet Take 150 mcg by mouth daily before breakfast.  09/30/14   [provider]  TRULICITY 8.41 LK/4.4WN SOPN Inject 0.75 mg into the skin once a week. 08/28/17   [provider]  VOLTAREN 1 % GEL Apply 2 g topically 4 (four) times daily.  11/30/13   [provider]    Family History Family History  Problem Relation Age of Onset  . Colon cancer Maternal Grandmother     Social History Social History   Tobacco Use  . Smoking status: Never Smoker  . Smokeless tobacco: Never Used  Substance Use Topics  . Alcohol use: Yes    Comment: one glass of wine every Friday night.  . Drug use: No     Allergies   Patient has no known allergies.   Review of Systems Review of Systems  Constitutional: Negative for fever.  HENT: Negative for nosebleeds.   Eyes: Negative for redness.  Respiratory: Negative for shortness of breath.   Cardiovascular: Negative for chest pain.  Gastrointestinal: Positive for blood in stool and hematochezia. Negative for abdominal pain and vomiting.  Genitourinary: Negative for flank pain and hematuria.  Musculoskeletal: Negative for back pain and neck pain.  Skin: Negative for rash.  Neurological: Positive for light-headedness. Negative for headaches.  Hematological: Does not bruise/bleed easily.   Psychiatric/Behavioral: Negative for confusion.     Physical Exam Updated Vital Signs BP (!) 129/92 (BP Location: Left Arm)   Pulse (!) 108   Temp 97.7 F (36.5 C) (Oral)   Resp 16   SpO2 100% Comment: 2 l  Physical Exam  Constitutional:  Thin, frail, pale appearing.   HENT:  Mouth/Throat: Oropharynx is clear and moist.  Eyes: Conjunctivae are normal. No scleral icterus.  Neck: Neck supple. No tracheal deviation present.  Cardiovascular: Regular rhythm, normal heart sounds and intact distal pulses.  Tachycardic.   Pulmonary/Chest: Effort normal and breath sounds normal. No respiratory distress.  Abdominal: Soft. Normal appearance and bowel sounds are normal. She exhibits no distension. There is no tenderness.  Genitourinary:  Genitourinary Comments: Dark blood and maroon stool.   Musculoskeletal: She exhibits no edema.  Neurological: She is alert.  Skin: Skin is warm and dry. No rash noted.  Psychiatric: She has a normal mood and affect.  Nursing note and vitals reviewed.    ED Treatments / Results  Labs (all labs ordered are listed, but only abnormal results are displayed) Results for orders placed or performed during the hospital encounter of 11/29/17  Comprehensive metabolic panel  Result Value Ref Range   Sodium 138 135 - 145 mmol/L   Potassium 4.0 3.5 - 5.1 mmol/L   Chloride 102 101 - 111 mmol/L   CO2 26 22 - 32 mmol/L   Glucose, Bld 274 (H) 65 - 99 mg/dL   BUN 15 6 - 20 mg/dL   Creatinine, Ser 0.48 0.44 - 1.00 mg/dL   Calcium 8.3 (L) 8.9 - 10.3 mg/dL   Total Protein 5.9 (L) 6.5 - 8.1 g/dL   Albumin 2.8 (L) 3.5 - 5.0 g/dL   AST 19 15 - 41 U/L   ALT 14 14 - 54 U/L   Alkaline Phosphatase 86 38 - 126 U/L   Total Bilirubin 0.6 0.3 - 1.2 mg/dL   GFR calc non Af Amer >60 >60 mL/min   GFR calc Af Amer >60 >60 mL/min   Anion gap 10 5 - 15  CBC  Result Value Ref Range   WBC 12.5 (H) 4.0 - 10.5 K/uL   RBC 3.81 (L) 3.87 - 5.11 MIL/uL   Hemoglobin 8.8 (L) 12.0  - 15.0 g/dL   HCT 29.7 (L) 36.0 - 46.0 %   MCV 78.0 78.0 - 100.0 fL   MCH 23.1 (L) 26.0 - 34.0 pg   MCHC 29.6 (L) 30.0 - 36.0 g/dL   RDW 17.1 (H) 11.5 - 15.5 %   Platelets 365 150 - 400 K/uL  Protime-INR - (order if Patient is taking Coumadin / Warfarin)  Result Value Ref Range   Prothrombin Time 13.6 11.4 - 15.2 seconds   INR 1.05   Hemoglobin A1c  Result Value Ref Range   Hgb A1c MFr Bld 7.5 (H) 4.8 - 5.6 %   Mean Plasma Glucose 169 mg/dL  CBC  Result Value Ref Range   WBC 11.1 (H) 4.0 - 10.5 K/uL   RBC 3.70 (L) 3.87 - 5.11 MIL/uL   Hemoglobin 8.8 (L) 12.0 - 15.0 g/dL   HCT 28.9 (L) 36.0 - 46.0 %   MCV 78.1 78.0 - 100.0 fL   MCH 23.8 (L) 26.0 - 34.0 pg   MCHC 30.4 30.0 - 36.0 g/dL   RDW 17.5 (H) 11.5 - 15.5 %   Platelets 311 150 - 400 K/uL  CBC  Result Value Ref Range   WBC 9.7 4.0 - 10.5 K/uL   RBC 3.24 (L) 3.87 - 5.11 MIL/uL   Hemoglobin 7.5 (L) 12.0 - 15.0 g/dL   HCT 25.1 (L) 36.0 - 46.0 %   MCV 77.5 (L) 78.0 - 100.0 fL   MCH 23.1 (L) 26.0 - 34.0 pg   MCHC 29.9 (L) 30.0 - 36.0 g/dL   RDW 17.0 (H) 11.5 - 15.5 %   Platelets 325 150 - 400 K/uL  CBC  Result Value Ref Range   WBC 8.3 4.0 - 10.5 K/uL   RBC 3.34 (L) 3.87 - 5.11 MIL/uL   Hemoglobin 7.6 (L) 12.0 - 15.0 g/dL   HCT 25.9 (L) 36.0 - 46.0 %   MCV 77.5 (L) 78.0 - 100.0 fL   MCH 22.8 (L) 26.0 - 34.0 pg   MCHC 29.3 (L) 30.0 - 36.0 g/dL   RDW 17.3 (H) 11.5 - 15.5 %   Platelets 338 150 - 400 K/uL  Basic metabolic panel  Result Value Ref Range   Sodium 141 135 - 145 mmol/L  Potassium 3.7 3.5 - 5.1 mmol/L   Chloride 105 101 - 111 mmol/L   CO2 26 22 - 32 mmol/L   Glucose, Bld 118 (H) 65 - 99 mg/dL   BUN 7 6 - 20 mg/dL   Creatinine, Ser 0.49 0.44 - 1.00 mg/dL   Calcium 8.0 (L) 8.9 - 10.3 mg/dL   GFR calc non Af Amer >60 >60 mL/min   GFR calc Af Amer >60 >60 mL/min   Anion gap 10 5 - 15  Glucose, capillary  Result Value Ref Range   Glucose-Capillary 143 (H) 65 - 99 mg/dL  Hemoglobin and hematocrit, blood   Result Value Ref Range   Hemoglobin 7.5 (L) 12.0 - 15.0 g/dL   HCT 25.4 (L) 36.0 - 46.0 %  POC occult blood, ED  Result Value Ref Range   Fecal Occult Bld POSITIVE (A) NEGATIVE  ECHOCARDIOGRAM COMPLETE  Result Value Ref Range   Weight 1,414.47 oz   Height 65 in   BP 125/55 mmHg  Type and screen [place]  Result Value Ref Range   ABO/RH(D) O POS    Antibody Screen NEG    Sample Expiration      12/02/2017 Performed at [facility], [address] [place], [place] [address]   ABO/Rh  Result Value Ref Range   ABO/RH(D)      O POS Performed at [place] [facility] [address] [place], [place] [address]    No results found.  EKG  EKG Interpretation None       Radiology No results found.  Procedures Procedures (including critical care time)  Medications Ordered in ED Medications - No data to display   Initial Impression / Assessment and Plan / ED Course  I have reviewed the triage vital signs and the nursing notes.  Pertinent labs & imaging results that were available during my care of the patient were reviewed by me and considered in my medical decision making (see chart for details).  Iv ns. Labs.  Reviewed nursing notes and prior charts for additional history.   2nd iv line placed.   protonix iv.   Ns bolus.   Type and screen.   Medical service consulted for admission.    Final Clinical Impressions(s) / ED Diagnoses   Final diagnoses:  None    ED Discharge Orders    None       Lajean Saver, MD 12/02/17 1113

## 2017-11-29 NOTE — H&P (Signed)
History and Physical    [name] CWC:[id number] DOB: 11-03-1928 DOA: 11/29/2017  PCP: Deland Pretty, MD Patient coming from: home  Chief Complaint: hematochezia/syncope  HPI: Rhonda BUZZELLI is a 84 y.o. female with medical history significant for diverticulosis, rheumatoid arthritis is wheelchair-bound, osteoarthritis, severe protein calorie malnutrition presents to the emergency department from home chief complaint bright red blood per rectum and reports of syncope event. [facility] are asked to admit  Information is obtained from the patient and the chart. She states she was in her usual state of health until this morning she is awakened by her home health aide who discovered dark bloody stool in the bed. Portably there was dark red blood/stool and clots. EMS was called and they reported a syncopal event when sitting her up. Patient denies any nausea vomiting or abdominal pain. No anticoagulation use. She reports she has a history of diverticulosis and his had a GI bleed in the past. She denies any dysuria hematuria frequency or urgency. She denies headache dizziness chest pain palpitations or shortness of breath. No NSAID use.    ED Course: In the emergency department she is afebrile tachycardia otherwise hemodynamically stable. She's not hypoxic.  Review of Systems: As per HPI otherwise all other systems reviewed and are negative.   Ambulatory Status: She lives at home with home health assistance she is wheelchair-bound  Past Medical History:  Diagnosis Date  . Acute bronchitis   . Anemia, unspecified   . Depressive disorder, not elsewhere classified   . Diverticulosis of colon (without mention of hemorrhage)   . Esophageal reflux   . Family history of malignant neoplasm of gastrointestinal tract   . Functional diarrhea   . Hiatal hernia   . Other and unspecified hyperlipidemia   . Other chest pain   . Other specified cardiac dysrhythmias(427.89)   .  Personal history of colonic polyps 07/17/1995   adenomatous polyps  . PONV (postoperative nausea and vomiting)   . Rheumatoid arthritis(714.0)   . Tachycardia, unspecified   . Type II or unspecified type diabetes mellitus with other specified manifestations, not stated as uncontrolled   . Unspecified adverse effect of unspecified drug, medicinal and biological substance   . Unspecified essential hypertension   . Unspecified hypothyroidism     Past Surgical History:  Procedure Laterality Date  . CATARACT EXTRACTION     left  . ORIF FEMUR FRACTURE  08/30/2012   Procedure: OPEN REDUCTION INTERNAL FIXATION (ORIF) DISTAL FEMUR FRACTURE;  Surgeon: Mauri Pole, MD;  Location: [facility];  Service: Orthopedics;  Laterality: Right;  proximal femur  . PARTIAL HYSTERECTOMY    . THYROIDECTOMY, PARTIAL      Social History   Socioeconomic History  . Marital status: Married    Spouse name: Not on file  . Number of children: 3  . Years of education: Not on file  . Highest education level: Not on file  Social Needs  . Financial resource strain: Not on file  . Food insecurity - worry: Not on file  . Food insecurity - inability: Not on file  . Transportation needs - medical: Not on file  . Transportation needs - non-medical: Not on file  Occupational History    Employer: RETIRED  Tobacco Use  . Smoking status: Never Smoker  . Smokeless tobacco: Never Used  Substance and Sexual Activity  . Alcohol use: Yes    Comment: one glass of wine every Friday night.  . Drug use: No  .  Sexual activity: No  Other Topics Concern  . Not on file  Social History Narrative  . Not on file    No Known Allergies  Family History  Problem Relation Age of Onset  . Colon cancer Maternal Grandmother     Prior to Admission medications   Medication Sig Start Date End Date Taking? Authorizing Provider  acetaminophen (TYLENOL) 325 MG tablet Take 650 mg by mouth daily as needed.    [provider]    alendronate (FOSAMAX) 70 MG tablet Take 70 mg by mouth every Saturday.  07/15/13   [provider]  folic acid (FOLVITE) 1 MG tablet Take 1 mg by mouth daily.    [provider]  ibuprofen (ADVIL,MOTRIN) 200 MG tablet Take 400 mg by mouth 2 (two) times daily as needed.    [provider]  leflunomide (ARAVA) 20 MG tablet Take 20 mg by mouth daily. 08/14/17   [provider]  linagliptin (TRADJENTA) 5 MG TABS tablet Take 5 mg by mouth daily.    [provider]  metFORMIN (GLUCOPHAGE-XR) 500 MG 24 hr tablet Take 500-1,000 mg by mouth See admin instructions. Take 2 tablets in the morning, and 1 tablet in the evening 03/07/15   [provider]  omeprazole (PRILOSEC) 40 MG capsule Take 40 mg by mouth daily. 08/14/17   [provider]  Pitavastatin Calcium (LIVALO) 2 MG TABS Take 2 mg by mouth every Monday, Wednesday, and Friday at 6 PM.     [provider]  predniSONE (DELTASONE) 1 MG tablet Take 4 mg by mouth daily with breakfast.    [provider]  SYNTHROID 150 MCG tablet Take 150 mcg by mouth daily before breakfast.  09/30/14   [provider]  TRULICITY 6.31 SH/7.84YO SOPN Inject 0.75 mg into the skin once a week. 08/28/17   [provider]  VOLTAREN 1 % GEL Apply 2 g topically 4 (four) times daily.  11/30/13   [provider]    Physical Exam: Vitals:   11/29/17 3785 11/29/17 0953 11/29/17 0959  BP:   (!) 129/92  Pulse:   (!) 108  Resp:   16  Temp:   97.7 F (36.5 C)  TempSrc:   Oral  SpO2: 99% 100% 100%     General:  Appears thin and frail calm and comfortable in no acute distress Eyes:  PERRL, EOMI, normal lids, iris ENT:  grossly normal hearing, lips & tongue, mucous membranes of her mouth are pink but dry Neck:  no LAD, masses or thyromegaly Cardiovascular:  Tachycardic but regular, no m/r/g. No LE edema.  Respiratory:  CTA bilaterally, no w/r/r. Normal respiratory  effort. Abdomen:  soft, ntnd, NABS Skin:  no rash or induration seen on limited exam Musculoskeletal:  RA deformities in hands and legs.  Psychiatric:  grossly normal mood and affect, speech fluent and appropriate, AOx3 Neurologic:  CN 2-12 grossly intact, moves all extremities in coordinated fashion, sensation intact  Labs on Admission: I have personally reviewed following labs and imaging studies  CBC: Recent Labs  Lab 11/29/17 0955  WBC 12.5*  HGB 8.8*  HCT 29.7*  MCV 78.0  PLT 885   Basic Metabolic Panel: Recent Labs  Lab 11/29/17 0955  NA 138  K 4.0  CL 102  CO2 26  GLUCOSE 274*  BUN 15  CREATININE 0.48  CALCIUM 8.3*   GFR: CrCl cannot be calculated (Unknown ideal weight.). Liver Function Tests: Recent Labs  Lab 11/29/17 [id number]  AST 19  ALT 14  ALKPHOS 86  BILITOT 0.6  PROT 5.9*  ALBUMIN 2.8*   No results for input(s): LIPASE, AMYLASE in the last 168 hours. No results for input(s): AMMONIA in the last 168 hours. Coagulation Profile: Recent Labs  Lab 11/29/17 0955  INR 1.05   Cardiac Enzymes: No results for input(s): CKTOTAL, CKMB, CKMBINDEX, TROPONINI in the last 168 hours. BNP (last 3 results) No results for input(s): PROBNP in the last 8760 hours. HbA1C: No results for input(s): HGBA1C in the last 72 hours. CBG: No results for input(s): GLUCAP in the last 168 hours. Lipid Profile: No results for input(s): CHOL, HDL, LDLCALC, TRIG, CHOLHDL, LDLDIRECT in the last 72 hours. Thyroid Function Tests: No results for input(s): TSH, T4TOTAL, FREET4, T3FREE, THYROIDAB in the last 72 hours. Anemia Panel: No results for input(s): VITAMINB12, FOLATE, FERRITIN, TIBC, IRON, RETICCTPCT in the last 72 hours. Urine analysis:    Component Value Date/Time   COLORURINE YELLOW 07/24/2017 1258   APPEARANCEUR HAZY (A) 07/24/2017 1258   LABSPEC 1.016 07/24/2017 1258   PHURINE 5.0 07/24/2017 1258   GLUCOSEU NEGATIVE 07/24/2017 1258   HGBUR MODERATE (A) 07/24/2017  1258   HGBUR negative 03/31/2009 1047   BILIRUBINUR NEGATIVE 07/24/2017 1258   KETONESUR 20 (A) 07/24/2017 1258   PROTEINUR NEGATIVE 07/24/2017 1258   UROBILINOGEN 0.2 08/28/2012 0543   NITRITE NEGATIVE 07/24/2017 1258   LEUKOCYTESUR TRACE (A) 07/24/2017 1258    Creatinine Clearance: CrCl cannot be calculated (Unknown ideal weight.).  Sepsis Labs: @LABRCNTIP (procalcitonin:4,lacticidven:4) )No results found for this or any previous visit (from the past 240 hour(s)).   Radiological Exams on Admission: No results found.  EKG: Sinus tachycardia LVH with secondary repolarization abnormality No significant change since last tracing  Assessment/Plan Principal Problem:   Hematochezia Active Problems:   Hypothyroidism   Type 2 diabetes mellitus with other specified complication (HCC)   Essential hypertension   TACHYCARDIA   Rheumatoid arthritis (HCC)   History of gastroesophageal reflux (GERD)   Diverticulosis   GI bleed   Syncope   #1.hematochezia/gi bleed. Patient with known history of diverticulosis as well as hospitalized 3 months ago for diverticular bleeding that resolved itself. Seen by GI that hospitalization who opted watch and wait. She's had 2 episodes while in the emergency department. -Admit to telemetry -Serial CBCs -Consider transfusion if hemoglobin drops to 7 or below -Obtain orthostatic vital signs -Clear liquids -Intake and output -If active bleeding ensues consider bleeding scan  #2. Syncope/ Tachycardia. Likely related #1. Heart rate 102 on admission. EKG as noted above. Patient denies chest pain palpitations. Blood pressure controlled in the emergency department -IV fluids -2-D echo -Orthostatic vital signs  #3. Rheumatoid arthritis. Home medications include prednisone, avana. Appears stable at baseline -Continue home meds  #4. Diabetes. Serum glucose 274 on admission. Home medications include metformin and Tradjenta. -Hold oral agents for  now -Clear liquids -Sliding scale for optimal control  #5. Anemia. It appears her hemoglobin baseline is close to 9. 11 8.8 on admission -See #1 -Serial CBCs -Monitor  #6. Hypothyroidism -Continue home meds    DVT prophylaxis: scd  Code Status: limited  Family Communication: son at bedside  Disposition Plan: home  Consults called: Clarita Leber  Admission status: inpatient    Radene Gunning MD [facility]  If 7PM-7AM, please contact night-coverage [email or web address] Password [facility]  11/29/2017, 11:38 AM

## 2017-11-30 DIAGNOSIS — K5790 Diverticulosis of intestine, part unspecified, without perforation or abscess without bleeding: Secondary | ICD-10-CM | POA: Diagnosis not present

## 2017-11-30 DIAGNOSIS — E43 Unspecified severe protein-calorie malnutrition: Secondary | ICD-10-CM | POA: Diagnosis not present

## 2017-11-30 DIAGNOSIS — I350 Nonrheumatic aortic (valve) stenosis: Secondary | ICD-10-CM | POA: Diagnosis not present

## 2017-11-30 DIAGNOSIS — Z681 Body mass index (BMI) 19 or less, adult: Secondary | ICD-10-CM | POA: Diagnosis not present

## 2017-11-30 DIAGNOSIS — K5731 Diverticulosis of large intestine without perforation or abscess with bleeding: Secondary | ICD-10-CM | POA: Diagnosis not present

## 2017-11-30 DIAGNOSIS — Z66 Do not resuscitate: Secondary | ICD-10-CM | POA: Diagnosis not present

## 2017-11-30 DIAGNOSIS — D62 Acute posthemorrhagic anemia: Secondary | ICD-10-CM | POA: Diagnosis not present

## 2017-11-30 DIAGNOSIS — K625 Hemorrhage of anus and rectum: Secondary | ICD-10-CM | POA: Diagnosis not present

## 2017-11-30 DIAGNOSIS — R55 Syncope and collapse: Secondary | ICD-10-CM | POA: Diagnosis not present

## 2017-11-30 DIAGNOSIS — K921 Melena: Secondary | ICD-10-CM | POA: Diagnosis not present

## 2017-11-30 LAB — BASIC METABOLIC PANEL
Anion gap: 10 (ref 5–15)
BUN: 7 mg/dL (ref 6–20)
CALCIUM: 8 mg/dL — AB (ref 8.9–10.3)
CO2: 26 mmol/L (ref 22–32)
CREATININE: 0.49 mg/dL (ref 0.44–1.00)
Chloride: 105 mmol/L (ref 101–111)
GFR calc Af Amer: >60 mL/min (ref >60)
GLUCOSE: 118 mg/dL — AB (ref 65–99)
Potassium: 3.7 mmol/L (ref 3.5–5.1)
SODIUM: 141 mmol/L (ref 135–145)

## 2017-11-30 LAB — CBC
HCT: 25.9 % — ABNORMAL LOW (ref 36.0–46.0)
Hemoglobin: 7.6 g/dL — ABNORMAL LOW (ref 12.0–15.0)
MCH: 22.8 pg — AB (ref 26.0–34.0)
MCHC: 29.3 g/dL — ABNORMAL LOW (ref 30.0–36.0)
MCV: 77.5 fL — AB (ref 78.0–100.0)
PLATELETS: 338 10*3/uL (ref 150–400)
RBC: 3.34 MIL/uL — AB (ref 3.87–5.11)
RDW: 17.3 % — AB (ref 11.5–15.5)
WBC: 8.3 10*3/uL (ref 4.0–10.5)

## 2017-11-30 LAB — HEMOGLOBIN AND HEMATOCRIT, BLOOD
HCT: 25.4 % — ABNORMAL LOW (ref 36.0–46.0)
Hemoglobin: 7.5 g/dL — ABNORMAL LOW (ref 12.0–15.0)

## 2017-11-30 LAB — GLUCOSE, CAPILLARY: GLUCOSE-CAPILLARY: 143 mg/dL — AB (ref 65–99)

## 2017-11-30 LAB — HEMOGLOBIN A1C
HEMOGLOBIN A1C: 7.5 % — AB (ref 4.8–5.6)
Mean Plasma Glucose: 169 mg/dL

## 2017-11-30 MED ORDER — FERROUS SULFATE 325 (65 FE) MG PO TBEC: 325.0000 mg | DELAYED_RELEASE_TABLET | Freq: Every day | ORAL | 3 refills | Status: DC

## 2017-11-30 MED ORDER — ENSURE ENLIVE PO LIQD
237.0000 mL | Freq: Two times a day (BID) | ORAL | Status: DC
  Administered 2017-11-30: 237 mL via ORAL

## 2017-11-30 MED ORDER — ENSURE ENLIVE PO LIQD: 237.0000 mL | Freq: Two times a day (BID) | ORAL | 12 refills | Status: DC

## 2017-11-30 NOTE — Progress Notes (Signed)
Cammie Mcgee to be D/C'd home per MD order. Discussed with the patient and son and all questions fully answered. VVS, Skin clean and dry. IV catheters discontinued intact. Sites without signs and symptoms of complications. Dressing and pressure applied.  An After Visit Summary and prescription was printed and given to the patient.  Patient escorted via [place], and D/C home via private auto.  Melonie [place]  11/30/2017 4:35 PM

## 2017-11-30 NOTE — Discharge Summary (Signed)
Physician Discharge Summary  MCKYNLEE LUSE DVV:[id number] DOB: 12-Sep-1929 DOA: 11/29/2017  PCP: Deland Pretty, MD  Admit date: 11/29/2017 Discharge date: 11/30/2017  Admitted From: Home Disposition: Home  Recommendations for Outpatient Follow-up:  1. Follow up with PCP in 1-2 weeks 2. Please obtain BMP/CBC in one week  Home Health: Yes 24-hour home health, self-pay Equipment/Devices: None  Discharge Condition: Stable CODE STATUS: DO NOT RESUSCITATE Diet recommendation: Regular diet with supplements  Brief/Interim Summary:  #) Lower GI bleeding form diverticular source: Patient was admitted with lower GI bleed.  She had one large episode of hematochezia that self resolved.  Her hemoglobin was 7.6 with a baseline of around 8-9.  She was seen by GI who did not recommend any intervention at this time.  She was discharged home on iron tablets daily.  #) Syncope: After hematochezia Pt had episode of syncope. EKG was unchanged. Echo done on 11/29/17 showed only moderate AS.  It was felt that in the setting of bleeding and moderate aortic stenosis patient had decreased cerebral perfusion.  The mean valve area was 1.4 cm.  Will refer to outpatient etiology.    #) Type 2 diabetes: Patient was maintained on sliding scale insulin here.  She was restarted on her home metformin, linagliptin and Trulicity at discharge.  #) Rheumatoid arthritis: Patient was continued on prednisone, leflunomide.  #) Hypothyroidism: Patient was continued on home levothyroxine 150 mcg daily.  Discharge Diagnoses:  Principal Problem:   Hematochezia Active Problems:   Hypothyroidism   Type 2 diabetes mellitus with other specified complication (HCC)   Essential hypertension   TACHYCARDIA   Rheumatoid arthritis (HCC)   History of gastroesophageal reflux (GERD)   Diverticulosis   GI bleed   Syncope   Protein-calorie malnutrition, severe    Discharge Instructions  Discharge Instructions    Call MD for:   persistant nausea and vomiting   Complete by:  As directed    Call MD for:  temperature >100.4   Complete by:  As directed    Diet - low sodium heart healthy   Complete by:  As directed    Discharge instructions   Complete by:  As directed    Please follow up with your PCP in 1 -2 weeks.   Increase activity slowly   Complete by:  As directed      Allergies as of 11/30/2017   No Known Allergies     Medication List    STOP taking these medications   ibuprofen 200 MG tablet Commonly known as:  ADVIL,MOTRIN     TAKE these medications   feeding supplement (ENSURE ENLIVE) Liqd Take 237 mLs by mouth 2 (two) times daily between meals.   ferrous sulfate 325 (65 FE) MG EC tablet Take 1 tablet (325 mg total) by mouth daily with breakfast.   folic acid 1 MG tablet Commonly known as:  FOLVITE Take 1 mg by mouth at bedtime.   leflunomide 20 MG tablet Commonly known as:  ARAVA Take 20 mg by mouth daily.   linagliptin 5 MG Tabs tablet Commonly known as:  TRADJENTA Take 5 mg by mouth daily.   LIVALO 2 MG Tabs Generic drug:  Pitavastatin Calcium Take 2 mg by mouth every Monday, Wednesday, and Friday at 6 PM.   metFORMIN 500 MG 24 hr tablet Commonly known as:  GLUCOPHAGE-XR Take 500-1,000 mg by mouth See admin instructions. Take 2 tablets in the morning, and 1 tablet in the evening   nitrofurantoin (macrocrystal-monohydrate) 100 MG  capsule Commonly known as:  MACROBID Take 100 mg by mouth daily. For 5 days   omeprazole 40 MG capsule Commonly known as:  PRILOSEC Take 40 mg by mouth daily.   predniSONE 1 MG tablet Commonly known as:  DELTASONE Take 1 mg by mouth 4 (four) times daily.   SYNTHROID 150 MCG tablet Generic drug:  levothyroxine Take 150 mcg by mouth daily before breakfast.   TRULICITY 8.88 BV/6.9IH Sopn Generic drug:  Dulaglutide Inject 0.75 mg into the skin once a week.   VOLTAREN 1 % Gel Generic drug:  diclofenac sodium Apply 2 g topically 4 (four) times  daily.       No Known Allergies  Consultations:  [facility]   Procedures/Studies:  No results found. Echo Dec 03, 2017:- Left ventricle: The cavity size was normal. There was mild   concentric hypertrophy. Systolic function was normal. The   estimated ejection fraction was in the range of 50% to 55%. The   study is not technically sufficient to allow evaluation of LV   diastolic function. - Aortic valve: Calcified aortic leaflets. There is mild to   moderate stenosis. Mean gradient (S): 9 mm Hg. Peak gradient (S):   18 mm Hg. Valve area (VTI): 1.47 cm^2. Valve area (Vmax): 1.47   cm^2. Valve area (Vmean): 1.45 cm^2. - Mitral valve: Calcified annulus. Mildly thickened leaflets .   There was mild regurgitation. - Left atrium: Moderately dilated. - Inferior vena cava: The vessel was normal in size. The   respirophasic diameter changes were in the normal range (>= 50%),   consistent with normal central venous pressure.  Impressions:  - LVEF 50-55%, mild LVH, mild to moderate aortic stenosis - AVA   around 1.5 cm2, mild MR, moderate LAE, normal IVC.   Subjective:   Discharge Exam: Vitals:   12/03/2017 2155 11/30/17 0549  BP: 102/64 134/69  Pulse: 100 (!) 103  Resp: 16 16  Temp: 97.6 F (36.4 C) 98.1 F (36.7 C)  SpO2: 95% 97%   Vitals:   Dec 03, 2017 1240 Dec 03, 2017 2155 11/30/17 0440 11/30/17 0549  BP: (!) 125/55 102/64  134/69  Pulse: 86 100  (!) 103  Resp:  16  16  Temp: 98 F (36.7 C) 97.6 F (36.4 C)  98.1 F (36.7 C)  TempSrc: Oral Oral  Axillary  SpO2: 99% 95%  97%  Weight: 40.1 kg (88 lb 6.5 oz)  39.7 kg (87 lb 8.4 oz)   Height: 5\' 5"  (1.651 m)       General: Pt is alert, awake, not in acute distress Cardiovascular: RRR, 2 out of 6 systolic murmur heard best in right upper sternal quadrant radiating to carotids Respiratory: CTA bilaterally, no wheezing, no rhonchi Abdominal: Soft, NT, ND, bowel sounds + Extremities: Gross ulnar deviation in bilateral  hands, boutonnieres deformity noted in bilateral fingers, large chronic changes    The results of significant diagnostics from this hospitalization (including imaging, microbiology, ancillary and laboratory) are listed below for reference.     Microbiology: No results found for this or any previous visit (from the past 240 hour(s)).   Labs: BNP (last 3 results) No results for input(s): BNP in the last 8760 hours. Basic Metabolic Panel: Recent Labs  Lab 2017/12/03 0955 11/30/17 0339  NA 138 141  K 4.0 3.7  CL 102 105  CO2 26 26  GLUCOSE 274* 118*  BUN 15 7  CREATININE 0.48 0.49  CALCIUM 8.3* 8.0*   Liver Function Tests: Recent Labs  Lab  11/29/17 0955  AST 19  ALT 14  ALKPHOS 86  BILITOT 0.6  PROT 5.9*  ALBUMIN 2.8*   No results for input(s): LIPASE, AMYLASE in the last 168 hours. No results for input(s): AMMONIA in the last 168 hours. CBC: Recent Labs  Lab 11/29/17 0955 11/29/17 1325 11/29/17 1941 11/30/17 0339 11/30/17 1226  WBC 12.5* 11.1* 9.7 8.3  --   HGB 8.8* 8.8* 7.5* 7.6* 7.5*  HCT 29.7* 28.9* 25.1* 25.9* 25.4*  MCV 78.0 78.1 77.5* 77.5*  --   PLT 365 311 325 338  --    Cardiac Enzymes: No results for input(s): CKTOTAL, CKMB, CKMBINDEX, TROPONINI in the last 168 hours. BNP: Invalid input(s): POCBNP CBG: Recent Labs  Lab 11/30/17 0549  GLUCAP 143*   D-Dimer No results for input(s): DDIMER in the last 72 hours. Hgb A1c Recent Labs    11/29/17 0955  HGBA1C 7.5*   Lipid Profile No results for input(s): CHOL, HDL, LDLCALC, TRIG, CHOLHDL, LDLDIRECT in the last 72 hours. Thyroid function studies No results for input(s): TSH, T4TOTAL, T3FREE, THYROIDAB in the last 72 hours.  Invalid input(s): FREET3 Anemia work up No results for input(s): VITAMINB12, FOLATE, FERRITIN, TIBC, IRON, RETICCTPCT in the last 72 hours. Urinalysis    Component Value Date/Time   COLORURINE YELLOW 07/24/2017 1258   APPEARANCEUR HAZY (A) 07/24/2017 1258   LABSPEC  1.016 07/24/2017 1258   PHURINE 5.0 07/24/2017 1258   GLUCOSEU NEGATIVE 07/24/2017 1258   HGBUR MODERATE (A) 07/24/2017 1258   HGBUR negative 03/31/2009 1047   BILIRUBINUR NEGATIVE 07/24/2017 1258   KETONESUR 20 (A) 07/24/2017 1258   PROTEINUR NEGATIVE 07/24/2017 1258   UROBILINOGEN 0.2 08/28/2012 0543   NITRITE NEGATIVE 07/24/2017 1258   LEUKOCYTESUR TRACE (A) 07/24/2017 1258   Sepsis Labs Invalid input(s): PROCALCITONIN,  WBC,  LACTICIDVEN Microbiology No results found for this or any previous visit (from the past 240 hour(s)).   Time coordinating discharge: Over 30 minutes  SIGNED:   Cristy Folks, MD  [facility] 11/30/2017, 2:19 PM  If 7PM-7AM, please contact night-coverage [email or web address] Password [email or web address]

## 2017-11-30 NOTE — Progress Notes (Signed)
Initial Nutrition Assessment  DOCUMENTATION CODES:   Underweight, Severe malnutrition in context of chronic illness  INTERVENTION:   Ensure Enlive po BID once diet advanced, each supplement provides 350 kcal and 20 grams of protein  Magic cup BID with meals once diet advanced, each supplement provides 290 kcal and 9 grams of protein  D/C Boost Breeze  NUTRITION DIAGNOSIS:   Severe Malnutrition related to chronic illness as evidenced by severe fat depletion, severe muscle depletion.  GOAL:   Patient will meet greater than or equal to 90% of their needs  MONITOR:   PO intake, Supplement acceptance, Labs, Weight trends  REASON FOR ASSESSMENT:   Malnutrition Screening Tool    ASSESSMENT:   84 yo female admitted with BRBPR with hx of recent presumed diverticular bleed. Pt with hx of diverticulosis, RA, wheel chair bound, severe protein calorie malnutrition, DM  Pt tolerating CL diet, reports she is "starving" and wants at cheeseburger. Diet just advanced to Regular. Pt sipping on Boost Breeze. Pt reports excellent appetite at home. Reports she eats 3 meals per day plus snacks and sometimes she uses Ensure but not as often as she should.   Pt is wheelchair bound due to severe RA; pt lives at home and has in-home care. LE and hand muscle wasting explained at least in part by RA and wheelchair bound status.   Noted no weight loss since December per chart review but pt is significantly UNDERWEIGHT  Labs: reviewed Meds: NS at 75 ml/hr, folic acid, prednisone  NUTRITION - FOCUSED PHYSICAL EXAM:    Most Recent Value  Orbital Region  Mild depletion  Upper Arm Region  Moderate depletion  Thoracic and Lumbar Region  Severe depletion  Buccal Region  Moderate depletion  Temple Region  Severe depletion  Clavicle Bone Region  Severe depletion  Clavicle and Acromion Bone Region  Severe depletion  Scapular Bone Region  Severe depletion  Dorsal Hand  Severe depletion  Patellar  Region  Severe depletion  Anterior Thigh Region  Severe depletion  Posterior Calf Region  Severe depletion  Edema (RD Assessment)  None       Diet Order:  Diet regular Room service appropriate? Yes; Fluid consistency: Thin  EDUCATION NEEDS:   No education needs have been identified at this time  Skin:  Skin Assessment: Skin Integrity Issues: Skin Integrity Issues:: Diabetic Ulcer, Stage I, Stage II Stage I: bilateral heel Stage II: sacrum Diabetic Ulcer: left leg  Last BM:  3/8  Height:   Ht Readings from Last 1 Encounters:  11/29/17 5\' 5"  (1.651 m)    Weight:   Wt Readings from Last 1 Encounters:  11/30/17 87 lb 8.4 oz (39.7 kg)    Ideal Body Weight:  56.8 kg  BMI:  Body mass index is 14.56 kg/m.  Estimated Nutritional Needs:   Kcal:  1300-1400 kcals  Protein:  65-70 g  Fluid:  >/= 1.3 L   Kerman Passey MS, RD, LDN, CNSC [phone] Pager  [phone] Weekend/On-Call Pager

## 2017-11-30 NOTE — Discharge Instructions (Signed)
Gastrointestinal Bleeding °Gastrointestinal (GI) bleeding is bleeding somewhere along the digestive tract, between the mouth and anus. This can be caused by various problems. The severity of these problems can range from mild to serious or even life-threatening. If you have GI bleeding, you may find blood in your stools (feces), you may have black stools, or you may vomit blood. If there is a lot of bleeding, you may need to stay in the hospital. °What are the causes? °This condition may be caused by: °· Esophagitis. This is inflammation, irritation, or swelling of the esophagus. °· Hemorrhoids. These are swollen veins in the rectum. °· Anal fissures. These are areas of painful tearing that are often caused by passing hard stool. °· Diverticulosis. These are pouches that form on the colon over time, with age, and may bleed a lot. °· Diverticulitis. This is inflammation in areas with diverticulosis. It can cause pain, fever, and bloody stools, although bleeding may be mild. °· Polyps and cancer. Colon cancer often starts out as precancerous polyps. °· Gastritis and ulcers. With these, bleeding may come from the upper GI tract, near the stomach. ° °What are the signs or symptoms? °Symptoms of this condition may include: °· Bright red blood in your vomit, or vomit that looks like coffee grounds. °· Bloody, black, or tarry stools. °? Bleeding from the lower GI tract will usually cause red or maroon blood in the stools. °? Bleeding from the upper GI tract may cause black, tarry, often bad-smelling stools. °? In certain cases, if the bleeding is fast enough, the stools may be red. °· Pain or cramping in the abdomen. ° °How is this diagnosed? °This condition may be diagnosed based on: °· Medical history and physical exam. °· Various tests, such as: °? Blood tests. °? X-rays and other imaging tests. °? Esophagogastroduodenoscopy (EGD). In this test, a flexible, lighted tube is used to look at your esophagus, stomach, and  small intestine. °? Colonoscopy. In this test, a flexible, lighted tube is used to look at your colon. ° °How is this treated? °Treatment for this condition depends on the cause of the bleeding. For example: °· For bleeding from the esophagus, stomach, small intestine, or colon, the health care provider doing your EGD or colonoscopy may be able to stop the bleeding as part of the procedure. °· Inflammation or infection of the colon can be treated with medicines. °· Certain rectal problems can be treated with creams, suppositories, or warm baths. °· Surgery is sometimes needed. °· Blood transfusions are sometimes needed if a lot of blood has been lost. ° °If bleeding is slow, you may be allowed to go home. If there is a lot of bleeding, you will need to stay in the hospital for observation. °Follow these instructions at home: °· Take over-the-counter and prescription medicines only as told by your health care provider. °· Eat foods that are high in fiber. This will help to keep your stools soft. These foods include whole grains, legumes, fruits, and vegetables. Eating 1-3 prunes each day works well for many people. °· Drink enough fluid to keep your urine clear or pale yellow. °· Keep all follow-up visits as told by your health care provider. This is important. °Contact a health care provider if: °· Your symptoms do not improve. °Get help right away if: °· Your bleeding increases. °· You feel light-headed or you faint. °· You feel weak. °· You have severe cramps in your back or abdomen. °· You pass large blood clots in your stool. °·   Your symptoms are getting worse. °This information is not intended to replace advice given to you by your health care provider. Make sure you discuss any questions you have with your health care provider. °Document Released: 09/07/2000 Document Revised: 02/08/2016 Document Reviewed: 02/28/2015 °Elsevier Interactive Patient Education © 2018 [employer] ° °

## 2017-11-30 NOTE — Progress Notes (Signed)
[name] 12:02 PM  Subjective: Patient doing well without signs of further bleeding and the patient was seen and examined and her hospital computer chart reviewed and this is her second presumed diverticular bleeding since December and she denies any aspirin or nonsteroidals or blood thinners  Objective: Vital signs stable afebrile no acute distress abdomen is soft nontender hemoglobin stable BUN normal  Assessment: Diverticular bleeding seemingly resolved  Plan: If no signs of bleeding later today can advance diet and hopefully she can go home soon  [facility] [place] [facility]  Pager [phone] After 5PM or if no answer call [phone]

## 2017-12-02 DIAGNOSIS — E43 Unspecified severe protein-calorie malnutrition: Secondary | ICD-10-CM | POA: Diagnosis not present

## 2017-12-02 DIAGNOSIS — I10 Essential (primary) hypertension: Secondary | ICD-10-CM | POA: Diagnosis not present

## 2017-12-02 DIAGNOSIS — M6281 Muscle weakness (generalized): Secondary | ICD-10-CM | POA: Diagnosis not present

## 2017-12-02 DIAGNOSIS — M160 Bilateral primary osteoarthritis of hip: Secondary | ICD-10-CM | POA: Diagnosis not present

## 2017-12-02 DIAGNOSIS — E119 Type 2 diabetes mellitus without complications: Secondary | ICD-10-CM | POA: Diagnosis not present

## 2017-12-02 DIAGNOSIS — M4800 Spinal stenosis, site unspecified: Secondary | ICD-10-CM | POA: Diagnosis not present

## 2017-12-02 DIAGNOSIS — Z7984 Long term (current) use of oral hypoglycemic drugs: Secondary | ICD-10-CM | POA: Diagnosis not present

## 2017-12-02 DIAGNOSIS — D649 Anemia, unspecified: Secondary | ICD-10-CM | POA: Diagnosis not present

## 2017-12-02 DIAGNOSIS — Z9181 History of falling: Secondary | ICD-10-CM | POA: Diagnosis not present

## 2017-12-02 DIAGNOSIS — F419 Anxiety disorder, unspecified: Secondary | ICD-10-CM | POA: Diagnosis not present

## 2017-12-02 DIAGNOSIS — M810 Age-related osteoporosis without current pathological fracture: Secondary | ICD-10-CM | POA: Diagnosis not present

## 2017-12-02 DIAGNOSIS — F329 Major depressive disorder, single episode, unspecified: Secondary | ICD-10-CM | POA: Diagnosis not present

## 2017-12-02 DIAGNOSIS — M069 Rheumatoid arthritis, unspecified: Secondary | ICD-10-CM | POA: Diagnosis not present

## 2017-12-02 DIAGNOSIS — K5790 Diverticulosis of intestine, part unspecified, without perforation or abscess without bleeding: Secondary | ICD-10-CM | POA: Diagnosis not present

## 2017-12-04 DIAGNOSIS — E46 Unspecified protein-calorie malnutrition: Secondary | ICD-10-CM | POA: Diagnosis not present

## 2017-12-04 DIAGNOSIS — Z8719 Personal history of other diseases of the digestive system: Secondary | ICD-10-CM | POA: Diagnosis not present

## 2017-12-04 DIAGNOSIS — D509 Iron deficiency anemia, unspecified: Secondary | ICD-10-CM | POA: Diagnosis not present

## 2017-12-04 DIAGNOSIS — N3000 Acute cystitis without hematuria: Secondary | ICD-10-CM | POA: Diagnosis not present

## 2017-12-04 DIAGNOSIS — E1149 Type 2 diabetes mellitus with other diabetic neurological complication: Secondary | ICD-10-CM | POA: Diagnosis not present

## 2017-12-04 DIAGNOSIS — R630 Anorexia: Secondary | ICD-10-CM | POA: Diagnosis not present

## 2017-12-04 DIAGNOSIS — R443 Hallucinations, unspecified: Secondary | ICD-10-CM | POA: Diagnosis not present

## 2017-12-05 ENCOUNTER — Inpatient Hospital Stay (HOSPITAL_COMMUNITY)
Admission: EM | Admit: 2017-12-05 | Discharge: 2017-12-07 | DRG: 377 | Disposition: A | Discharge status: Home / Self Care | Payer: Medicare Other | Attending: Internal Medicine | Admitting: Internal Medicine

## 2017-12-05 ENCOUNTER — Inpatient Hospital Stay (HOSPITAL_COMMUNITY): Payer: Medicare Other

## 2017-12-05 ENCOUNTER — Other Ambulatory Visit: Payer: Self-pay

## 2017-12-05 ENCOUNTER — Encounter (HOSPITAL_COMMUNITY): Payer: Self-pay | Admitting: Emergency Medicine

## 2017-12-05 ENCOUNTER — Emergency Department (HOSPITAL_COMMUNITY): Payer: Medicare Other

## 2017-12-05 DIAGNOSIS — R4781 Slurred speech: Secondary | ICD-10-CM | POA: Diagnosis not present

## 2017-12-05 DIAGNOSIS — K922 Gastrointestinal hemorrhage, unspecified: Secondary | ICD-10-CM | POA: Diagnosis present

## 2017-12-05 DIAGNOSIS — D500 Iron deficiency anemia secondary to blood loss (chronic): Secondary | ICD-10-CM | POA: Diagnosis present

## 2017-12-05 DIAGNOSIS — Z681 Body mass index (BMI) 19 or less, adult: Secondary | ICD-10-CM | POA: Diagnosis not present

## 2017-12-05 DIAGNOSIS — L97529 Non-pressure chronic ulcer of other part of left foot with unspecified severity: Secondary | ICD-10-CM | POA: Diagnosis present

## 2017-12-05 DIAGNOSIS — E876 Hypokalemia: Secondary | ICD-10-CM | POA: Diagnosis not present

## 2017-12-05 DIAGNOSIS — K5790 Diverticulosis of intestine, part unspecified, without perforation or abscess without bleeding: Secondary | ICD-10-CM | POA: Diagnosis not present

## 2017-12-05 DIAGNOSIS — K219 Gastro-esophageal reflux disease without esophagitis: Secondary | ICD-10-CM | POA: Diagnosis present

## 2017-12-05 DIAGNOSIS — D649 Anemia, unspecified: Secondary | ICD-10-CM

## 2017-12-05 DIAGNOSIS — M069 Rheumatoid arthritis, unspecified: Secondary | ICD-10-CM | POA: Diagnosis present

## 2017-12-05 DIAGNOSIS — Z8719 Personal history of other diseases of the digestive system: Secondary | ICD-10-CM

## 2017-12-05 DIAGNOSIS — Z9841 Cataract extraction status, right eye: Secondary | ICD-10-CM

## 2017-12-05 DIAGNOSIS — D509 Iron deficiency anemia, unspecified: Secondary | ICD-10-CM | POA: Diagnosis not present

## 2017-12-05 DIAGNOSIS — I6789 Other cerebrovascular disease: Secondary | ICD-10-CM | POA: Diagnosis not present

## 2017-12-05 DIAGNOSIS — Z9842 Cataract extraction status, left eye: Secondary | ICD-10-CM | POA: Diagnosis not present

## 2017-12-05 DIAGNOSIS — I4891 Unspecified atrial fibrillation: Secondary | ICD-10-CM | POA: Diagnosis present

## 2017-12-05 DIAGNOSIS — E039 Hypothyroidism, unspecified: Secondary | ICD-10-CM | POA: Diagnosis not present

## 2017-12-05 DIAGNOSIS — Z7952 Long term (current) use of systemic steroids: Secondary | ICD-10-CM

## 2017-12-05 DIAGNOSIS — L97421 Non-pressure chronic ulcer of left heel and midfoot limited to breakdown of skin: Secondary | ICD-10-CM

## 2017-12-05 DIAGNOSIS — Z7984 Long term (current) use of oral hypoglycemic drugs: Secondary | ICD-10-CM | POA: Diagnosis not present

## 2017-12-05 DIAGNOSIS — Z86010 Personal history of colonic polyps: Secondary | ICD-10-CM

## 2017-12-05 DIAGNOSIS — M810 Age-related osteoporosis without current pathological fracture: Secondary | ICD-10-CM | POA: Diagnosis not present

## 2017-12-05 DIAGNOSIS — K284 Chronic or unspecified gastrojejunal ulcer with hemorrhage: Secondary | ICD-10-CM | POA: Diagnosis not present

## 2017-12-05 DIAGNOSIS — Z79899 Other long term (current) drug therapy: Secondary | ICD-10-CM | POA: Diagnosis not present

## 2017-12-05 DIAGNOSIS — I10 Essential (primary) hypertension: Secondary | ICD-10-CM | POA: Diagnosis not present

## 2017-12-05 DIAGNOSIS — E785 Hyperlipidemia, unspecified: Secondary | ICD-10-CM | POA: Diagnosis present

## 2017-12-05 DIAGNOSIS — E1149 Type 2 diabetes mellitus with other diabetic neurological complication: Secondary | ICD-10-CM | POA: Diagnosis not present

## 2017-12-05 DIAGNOSIS — E860 Dehydration: Secondary | ICD-10-CM | POA: Diagnosis present

## 2017-12-05 DIAGNOSIS — E11621 Type 2 diabetes mellitus with foot ulcer: Secondary | ICD-10-CM | POA: Diagnosis not present

## 2017-12-05 DIAGNOSIS — I350 Nonrheumatic aortic (valve) stenosis: Secondary | ICD-10-CM | POA: Diagnosis present

## 2017-12-05 DIAGNOSIS — J849 Interstitial pulmonary disease, unspecified: Secondary | ICD-10-CM | POA: Diagnosis not present

## 2017-12-05 DIAGNOSIS — E43 Unspecified severe protein-calorie malnutrition: Secondary | ICD-10-CM | POA: Diagnosis present

## 2017-12-05 DIAGNOSIS — M0579 Rheumatoid arthritis with rheumatoid factor of multiple sites without organ or systems involvement: Secondary | ICD-10-CM | POA: Diagnosis not present

## 2017-12-05 DIAGNOSIS — E1169 Type 2 diabetes mellitus with other specified complication: Secondary | ICD-10-CM | POA: Diagnosis present

## 2017-12-05 DIAGNOSIS — E119 Type 2 diabetes mellitus without complications: Secondary | ICD-10-CM | POA: Diagnosis not present

## 2017-12-05 DIAGNOSIS — K921 Melena: Principal | ICD-10-CM | POA: Diagnosis present

## 2017-12-05 DIAGNOSIS — R4020 Unspecified coma: Secondary | ICD-10-CM | POA: Diagnosis not present

## 2017-12-05 DIAGNOSIS — E7800 Pure hypercholesterolemia, unspecified: Secondary | ICD-10-CM | POA: Diagnosis not present

## 2017-12-05 LAB — CBC WITH DIFFERENTIAL/PLATELET
BASOS ABS: 0 10*3/uL (ref 0.0–0.1)
BASOS PCT: 0 %
EOS ABS: 0.3 10*3/uL (ref 0.0–0.7)
Eosinophils Relative: 3 %
HCT: 24.3 % — ABNORMAL LOW (ref 36.0–46.0)
Hemoglobin: 7.2 g/dL — ABNORMAL LOW (ref 12.0–15.0)
Lymphocytes Relative: 12 %
Lymphs Abs: 1.1 10*3/uL (ref 0.7–4.0)
MCH: 22.9 pg — ABNORMAL LOW (ref 26.0–34.0)
MCHC: 29.6 g/dL — AB (ref 30.0–36.0)
MCV: 77.4 fL — ABNORMAL LOW (ref 78.0–100.0)
Monocytes Absolute: 0.6 10*3/uL (ref 0.1–1.0)
Monocytes Relative: 7 %
NEUTROS ABS: 6.8 10*3/uL (ref 1.7–7.7)
Neutrophils Relative %: 78 %
Platelets: 375 10*3/uL (ref 150–400)
RBC: 3.14 MIL/uL — AB (ref 3.87–5.11)
RDW: 17.9 % — ABNORMAL HIGH (ref 11.5–15.5)
WBC: 8.8 10*3/uL (ref 4.0–10.5)

## 2017-12-05 LAB — I-STAT CHEM 8, ED
BUN: 12 mg/dL (ref 6–20)
CREATININE: 0.5 mg/dL (ref 0.44–1.00)
Calcium, Ion: 0.91 mmol/L — ABNORMAL LOW (ref 1.15–1.40)
Chloride: 102 mmol/L (ref 101–111)
GLUCOSE: 151 mg/dL — AB (ref 65–99)
HCT: 24 % — ABNORMAL LOW (ref 36.0–46.0)
HEMOGLOBIN: 8.2 g/dL — AB (ref 12.0–15.0)
Potassium: 3.7 mmol/L (ref 3.5–5.1)
Sodium: 140 mmol/L (ref 135–145)
TCO2: 25 mmol/L (ref 22–32)

## 2017-12-05 LAB — COMPREHENSIVE METABOLIC PANEL
ALBUMIN: 3 g/dL — AB (ref 3.5–5.0)
ALK PHOS: 79 U/L (ref 38–126)
ALT: 12 U/L — AB (ref 14–54)
ANION GAP: 11 (ref 5–15)
AST: 15 U/L (ref 15–41)
BUN: 12 mg/dL (ref 6–20)
CALCIUM: 8.3 mg/dL — AB (ref 8.9–10.3)
CO2: 24 mmol/L (ref 22–32)
Chloride: 103 mmol/L (ref 101–111)
Creatinine, Ser: 0.52 mg/dL (ref 0.44–1.00)
GFR calc Af Amer: >60 mL/min (ref >60)
GFR calc non Af Amer: >60 mL/min (ref >60)
GLUCOSE: 153 mg/dL — AB (ref 65–99)
Potassium: 3.7 mmol/L (ref 3.5–5.1)
SODIUM: 138 mmol/L (ref 135–145)
Total Bilirubin: 0.7 mg/dL (ref 0.3–1.2)
Total Protein: 5.3 g/dL — ABNORMAL LOW (ref 6.5–8.1)

## 2017-12-05 LAB — TSH: TSH: 0.251 u[IU]/mL — AB (ref 0.350–4.500)

## 2017-12-05 LAB — PREPARE RBC (CROSSMATCH)

## 2017-12-05 LAB — CBG MONITORING, ED: GLUCOSE-CAPILLARY: 170 mg/dL — AB (ref 65–99)

## 2017-12-05 LAB — GLUCOSE, CAPILLARY
Glucose-Capillary: 147 mg/dL — ABNORMAL HIGH (ref 65–99)
Glucose-Capillary: 116 mg/dL — ABNORMAL HIGH (ref 65–99)

## 2017-12-05 LAB — AMMONIA: AMMONIA: 10 umol/L (ref 9–35)

## 2017-12-05 LAB — POC OCCULT BLOOD, ED: Fecal Occult Bld: POSITIVE — AB

## 2017-12-05 LAB — I-STAT TROPONIN, ED: Troponin i, poc: 0.03 ng/mL (ref 0.00–0.08)

## 2017-12-05 LAB — MAGNESIUM: MAGNESIUM: 1.3 mg/dL — AB (ref 1.7–2.4)

## 2017-12-05 MED ORDER — SODIUM CHLORIDE 0.9 % IV SOLN
Freq: Once | INTRAVENOUS | Status: AC
  Administered 2017-12-05: 14:00:00 via INTRAVENOUS

## 2017-12-05 MED ORDER — MAGNESIUM SULFATE 2 GM/50ML IV SOLN
2.0000 g | Freq: Once | INTRAVENOUS | Status: AC
  Administered 2017-12-05: 2 g via INTRAVENOUS
  Filled 2017-12-05: qty 50

## 2017-12-05 MED ORDER — LEVALBUTEROL HCL 0.63 MG/3ML IN NEBU: 0.6300 mg | INHALATION_SOLUTION | Freq: Four times a day (QID) | RESPIRATORY_TRACT | Status: DC | PRN

## 2017-12-05 MED ORDER — IOPAMIDOL (ISOVUE-300) INJECTION 61%
INTRAVENOUS | Status: AC
  Filled 2017-12-05: qty 100

## 2017-12-05 MED ORDER — ACETAMINOPHEN 325 MG PO TABS: 650.0000 mg | ORAL_TABLET | Freq: Four times a day (QID) | ORAL | Status: DC | PRN

## 2017-12-05 MED ORDER — PANTOPRAZOLE SODIUM 40 MG IV SOLR: 40.0000 mg | Freq: Two times a day (BID) | INTRAVENOUS | Status: DC

## 2017-12-05 MED ORDER — FERROUS SULFATE 325 (65 FE) MG PO TABS
325.0000 mg | ORAL_TABLET | Freq: Every day | ORAL | Status: DC
  Filled 2017-12-05: qty 1

## 2017-12-05 MED ORDER — FOLIC ACID 1 MG PO TABS
1.0000 mg | ORAL_TABLET | Freq: Every day | ORAL | Status: DC
  Administered 2017-12-05 – 2017-12-06 (×2): 1 mg via ORAL
  Filled 2017-12-05 (×2): qty 1

## 2017-12-05 MED ORDER — HYDRALAZINE HCL 20 MG/ML IJ SOLN: 5.0000 mg | INTRAMUSCULAR | Status: DC | PRN

## 2017-12-05 MED ORDER — IOPAMIDOL (ISOVUE-300) INJECTION 61%
INTRAVENOUS | Status: AC
  Administered 2017-12-05: 75 mL
  Filled 2017-12-05: qty 100

## 2017-12-05 MED ORDER — PANTOPRAZOLE SODIUM 40 MG IV SOLR: 40.0000 mg | Freq: Once | INTRAVENOUS | Status: DC

## 2017-12-05 MED ORDER — PANTOPRAZOLE SODIUM 40 MG IV SOLR
40.0000 mg | Freq: Two times a day (BID) | INTRAVENOUS | Status: DC
Start: 2017-12-05 — End: 2017-12-06
  Administered 2017-12-05 – 2017-12-06 (×3): 40 mg via INTRAVENOUS
  Filled 2017-12-05 (×3): qty 40

## 2017-12-05 MED ORDER — ONDANSETRON HCL 4 MG PO TABS: 4.0000 mg | ORAL_TABLET | Freq: Four times a day (QID) | ORAL | Status: DC | PRN

## 2017-12-05 MED ORDER — ACETAMINOPHEN 650 MG RE SUPP: 650.0000 mg | Freq: Four times a day (QID) | RECTAL | Status: DC | PRN

## 2017-12-05 MED ORDER — HEPARIN SODIUM (PORCINE) 5000 UNIT/ML IJ SOLN: 5000.0000 [IU] | Freq: Three times a day (TID) | INTRAMUSCULAR | Status: DC

## 2017-12-05 MED ORDER — INSULIN ASPART 100 UNIT/ML ~~LOC~~ SOLN
0.0000 [IU] | SUBCUTANEOUS | Status: DC
  Administered 2017-12-05: 1 [IU] via SUBCUTANEOUS
  Administered 2017-12-06: 5 [IU] via SUBCUTANEOUS
  Administered 2017-12-06: 2 [IU] via SUBCUTANEOUS
  Administered 2017-12-07: 1 [IU] via SUBCUTANEOUS
  Filled 2017-12-05: qty 1

## 2017-12-05 MED ORDER — IOPAMIDOL (ISOVUE-300) INJECTION 61%
INTRAVENOUS | Status: AC
  Filled 2017-12-05: qty 30

## 2017-12-05 MED ORDER — ONDANSETRON HCL 4 MG/2ML IJ SOLN: 4.0000 mg | Freq: Four times a day (QID) | INTRAMUSCULAR | Status: DC | PRN

## 2017-12-05 MED ORDER — SODIUM CHLORIDE 0.9 % IV SOLN: Freq: Once | INTRAVENOUS | Status: DC

## 2017-12-05 MED ORDER — SODIUM CHLORIDE 0.9 % IV SOLN: INTRAVENOUS | Status: DC

## 2017-12-05 NOTE — Progress Notes (Signed)
Attempted report 

## 2017-12-05 NOTE — ED Provider Notes (Signed)
[facility] EMERGENCY DEPARTMENT Provider Note   CSN: [id number] Arrival date & time: 12/05/17  1019     History   Chief Complaint Chief Complaint  Patient presents with  . Altered Mental Status    HPI [name] is a 84 y.o. female.  HPI Was brought by EMS from home.  EMS report indicates family member stated patient had slurred speech and was unable to raise her arms.  Patient's family members have not presented to the emergency department at this time to provide additional details.  Patient is main historian.  Patient is alert and appropriate.  She reports she thought she was being sent here to do some lab work and get evaluated for a possible surgery.  She denies recollection of episode of weakness as outlined by EMS.  She reports she does feel like she has a really better taste in her mouth and nauseated like she might vomit.  She however does not feel she has a focal area weakness. Past Medical History:  Diagnosis Date  . Acute bronchitis   . Anemia, unspecified   . Atrial fibrillation ([place])   . Depressive disorder, not elsewhere classified   . Diverticulosis of colon (without mention of hemorrhage)   . Esophageal reflux   . Family history of malignant neoplasm of gastrointestinal tract   . Functional diarrhea   . Hiatal hernia   . History of blood transfusion    "once; related to diverticulitis" (11/29/2017)  . Other and unspecified hyperlipidemia   . Other chest pain   . Other specified cardiac dysrhythmias(427.89)   . Personal history of colonic polyps 07/17/1995   adenomatous polyps  . PONV (postoperative nausea and vomiting)   . Recurrent UTI (urinary tract infection)    "2-3 times in the last 1 1/82yr" (11/29/2017)  . Rheumatoid arthritis ([place])   . Tachycardia, unspecified   . Type II diabetes mellitus ([place])   . Unspecified adverse effect of unspecified drug, medicinal and biological substance   . Unspecified essential hypertension   .  Unspecified hypothyroidism     Patient Active Problem List   Diagnosis Date Noted  . Protein-calorie malnutrition, severe 11/30/2017  . Hematochezia 11/29/2017  . Syncope 11/29/2017  . GI bleed 09/11/2017  . Lower GI bleed 09/10/2017  . Diabetic ulcer of left foot associated with type 2 diabetes mellitus ([place]) 08/11/2015  . Closed right hip fracture ([place]) 08/28/2012  . Rheumatoid arthritis(714.0) 08/28/2012  . History of gastroesophageal reflux (GERD) 06/26/2011  . Diverticulosis 06/26/2011  . Lower GI bleeding 06/26/2011  . DEPRESSION 03/31/2009  . DIARRHEA, FUNCTIONAL 03/31/2009  . DIABETIC HYPOGLYCEMIA, TYPE II 11/03/2008  . ANEMIA, MILD 11/03/2008  . ACUTE BRONCHITIS 08/23/2008  . AV NODAL REENTRY TACHYCARDIA 10/29/2007  . TACHYCARDIA 10/29/2007  . CHEST PAIN, ATYPICAL 10/25/2007  . ADVEF, DRUG/MEDICINAL/BIOLOGICAL SUBST NOS 07/16/2007  . Nonspecific (abnormal) findings on radiological and other examination of body structure 04/03/2007  . CHEST XRAY, ABNORMAL 04/03/2007  . Hypothyroidism 03/06/2007  . Type 2 diabetes mellitus with other specified complication ([place]) 63/87/5643  . HYPERLIPIDEMIA 03/06/2007  . Essential hypertension 03/06/2007  . DIVERTICULOSIS, COLON 03/06/2007  . Rheumatoid arthritis ([place]) 03/06/2007    Past Surgical History:  Procedure Laterality Date  . ABDOMINAL HYSTERECTOMY     "partial"  . CATARACT EXTRACTION Bilateral   . FOREARM FRACTURE SURGERY Right 1940s?  . FRACTURE SURGERY    . ORIF FEMUR FRACTURE  08/30/2012   Procedure: OPEN REDUCTION INTERNAL FIXATION (ORIF) DISTAL FEMUR  FRACTURE;  Surgeon: Mauri Pole, MD;  Location: [facility] ORS;  Service: Orthopedics;  Laterality: Right;  proximal femur  . THYROIDECTOMY, PARTIAL      OB History    No data available       Home Medications    Prior to Admission medications   Medication Sig Start Date End Date Taking? Authorizing Provider  ciprofloxacin (CIPRO) 250 MG tablet Take 250 mg by mouth  2 (two) times daily.   Yes [provider]  feeding supplement, ENSURE ENLIVE, (ENSURE ENLIVE) LIQD Take 237 mLs by mouth 2 (two) times daily between meals. 11/30/17  Yes Purohit, Konrad Dolores, MD  ferrous sulfate 325 (65 FE) MG EC tablet Take 1 tablet (325 mg total) by mouth daily with breakfast. 11/30/17 11/30/18 Yes Purohit, Konrad Dolores, MD  folic acid (FOLVITE) 1 MG tablet Take 1 mg by mouth at bedtime.    Yes [provider]  leflunomide (ARAVA) 20 MG tablet Take 20 mg by mouth daily. 08/14/17  Yes [provider]  linagliptin (TRADJENTA) 5 MG TABS tablet Take 5 mg by mouth daily.   Yes [provider]  metFORMIN (GLUCOPHAGE-XR) 500 MG 24 hr tablet Take 500-1,000 mg by mouth See admin instructions. Take 2 tablets in the morning, and 1 tablet in the evening 03/07/15  Yes [provider]  omeprazole (PRILOSEC) 40 MG capsule Take 40 mg by mouth daily. 08/14/17  Yes [provider]  Pitavastatin Calcium (LIVALO) 2 MG TABS Take 2 mg by mouth every Monday, Wednesday, and Friday at 6 PM.    Yes [provider]  predniSONE (DELTASONE) 1 MG tablet Take 4 mg by mouth daily with breakfast.    Yes [provider]  SYNTHROID 150 MCG tablet Take 150 mcg by mouth daily before breakfast.  09/30/14  Yes [provider]  TRULICITY 9.56 OZ/3.0QM SOPN Inject 0.75 mg into the skin once a week. 08/28/17  Yes [provider]  VOLTAREN 1 % GEL Apply 2 g topically 4 (four) times daily.  11/30/13  Yes [provider]    Family History Family History  Problem Relation Age of Onset  . Colon cancer Maternal Grandmother     Social History Social History   Tobacco Use  . Smoking status: Never Smoker  . Smokeless tobacco: Never Used  Substance Use Topics  . Alcohol use: Yes    Alcohol/week: 0.6 oz    Types: 1 Glasses of wine per week  . Drug use: No     Allergies   Patient has no known allergies.   Review of Systems Review of  Systems 10 Systems reviewed and are negative for acute change except as noted in the HPI.   Physical Exam Updated Vital Signs BP (!) 174/93   Pulse (!) 101   Temp 97.7 F (36.5 C) (Oral)   Resp (!) 22   Ht 5\' 5"  (1.651 m)   Wt 39.5 kg (87 lb)   SpO2 100%   BMI 14.48 kg/m   Physical Exam  Constitutional: She is oriented to person, place, and time.  Patient is alert and interactive.  No respiratory distress.  She is very pale in appearance.  Extremely thin and frail appearance.  HENT:  Head: Normocephalic and atraumatic.  mucous membranes dry.  Eyes: EOM are normal.  Neck: Neck supple. JVD present.  Cardiovascular:  Borderline tachycardia.  Pulmonary/Chest: Effort normal and breath sounds normal.  Abdominal: Soft. She exhibits no distension. There is no tenderness. There  is no guarding.  Genitourinary:  Genitourinary Comments: Rectal has melanotic\cranberry colored stool.  Musculoskeletal: Normal range of motion.  Patient has intact range of motion of all 4 extremities.  She does have very thin fragile skin.  She has dressings on the lower legs.  Neurological: She is alert and oriented to person, place, and time. No cranial nerve deficit. She exhibits normal muscle tone. Coordination normal.  Skin: Skin is warm and dry. There is pallor.  Patient has a padded dressing on her sacral area.  Psychiatric: She has a normal mood and affect.     ED Treatments / Results  Labs (all labs ordered are listed, but only abnormal results are displayed) Labs Reviewed  TSH - Abnormal; Notable for the following components:      Result Value   TSH 0.251 (*)    All other components within normal limits  CBC WITH DIFFERENTIAL/PLATELET - Abnormal; Notable for the following components:   RBC 3.14 (*)    Hemoglobin 7.2 (*)    HCT 24.3 (*)    MCV 77.4 (*)    MCH 22.9 (*)    MCHC 29.6 (*)    RDW 17.9 (*)    All other components within normal limits  COMPREHENSIVE METABOLIC PANEL -  Abnormal; Notable for the following components:   Glucose, Bld 153 (*)    Calcium 8.3 (*)    Total Protein 5.3 (*)    Albumin 3.0 (*)    ALT 12 (*)    All other components within normal limits  I-STAT CHEM 8, ED - Abnormal; Notable for the following components:   Glucose, Bld 151 (*)    Calcium, Ion 0.91 (*)    Hemoglobin 8.2 (*)    HCT 24.0 (*)    All other components within normal limits  URINE CULTURE  AMMONIA  URINALYSIS, ROUTINE W REFLEX MICROSCOPIC  URINALYSIS, ROUTINE W REFLEX MICROSCOPIC  T4, FREE  I-STAT TROPONIN, ED  POC OCCULT BLOOD, ED  TYPE AND SCREEN  PREPARE RBC (CROSSMATCH)    EKG  EKG Interpretation  Date/Time:  Thursday December 05 2017 10:52:33 EDT Ventricular Rate:  93 PR Interval:    QRS Duration: 82 QT Interval:  351 QTC Calculation: 437 R Axis:   69 Text Interpretation:  Sinus tachycardia Supraventricular bigeminy Probable anterior infarct, age indeterminate Lateral leads are also involved agree. no sig change from previous Confirmed by Charlesetta Shanks [phone]) on 12/05/2017 1:02:45 PM       Radiology Dg Chest 2 View  Result Date: 12/05/2017 CLINICAL DATA:  Slurred speech. EXAM: CHEST - 2 VIEW COMPARISON:  06/08/2017.  04/16/2016. FINDINGS: Mediastinum and hilar structures are normal. Cardiomegaly with normal pulmonary vascularity. No focal infiltrate. Mild bilateral chronic interstitial prominence. No change. No pleural effusion or pneumothorax. IMPRESSION: 1. Mild bilateral chronic interstitial prominence consistent chronic interstitial lung disease. No acute pulmonary disease. 2.  Cardiomegaly with normal pulmonary vascularity. Electronically Signed   By: Marcello Moores  Register   On: 12/05/2017 11:39   Ct Head Wo Contrast  Result Date: 12/05/2017 CLINICAL DATA:  Altered level of consciousness. Slurred speech. Unable to raise arms. Recent UTI. No reported injury. EXAM: CT HEAD WITHOUT CONTRAST TECHNIQUE: Contiguous axial images were obtained from the base  of the skull through the vertex without intravenous contrast. COMPARISON:  07/24/2017 head CT. FINDINGS: Brain: No evidence of parenchymal hemorrhage or extra-axial fluid collection. No mass lesion, mass effect, or midline shift. No CT evidence of acute infarction. Nonspecific moderate subcortical and periventricular white  matter hypodensity, most in keeping with chronic small vessel ischemic change. Generalized cerebral volume loss. Cerebral ventricle sizes are stable and concordant with the degree of cerebral volume loss. Vascular: No acute abnormality. Skull: No evidence of calvarial fracture. Sinuses/Orbits: The visualized paranasal sinuses are essentially clear. Other:  The mastoid air cells are unopacified. IMPRESSION: 1.  No evidence of acute intracranial abnormality. 2. Generalized cerebral volume loss and moderate chronic small vessel ischemic changes in the cerebral white matter. Electronically Signed   By: Ilona Sorrel M.D.   On: 12/05/2017 11:33    Procedures Procedures (including critical care time) CRITICAL CARE Performed by: Si Gaul   Total critical care time: 20 minutes  Critical care time was exclusive of separately billable procedures and treating other patients.  Critical care was necessary to treat or prevent imminent or life-threatening deterioration.  Critical care was time spent personally by me on the following activities: development of treatment plan with patient and/or surrogate as well as nursing, discussions with consultants, evaluation of patient's response to treatment, examination of patient, obtaining history from patient or surrogate, ordering and performing treatments and interventions, ordering and review of laboratory studies, ordering and review of radiographic studies, pulse oximetry and re-evaluation of patient's condition. Medications Ordered in ED Medications  0.9 %  sodium chloride infusion (not administered)  pantoprazole (PROTONIX) injection 40  mg (not administered)     Initial Impression / Assessment and Plan / ED Course  I have reviewed the triage vital signs and the nursing notes.  Pertinent labs & imaging results that were available during my care of the patient were reviewed by me and considered in my medical decision making (see chart for details).    Consult: [facility] hospitalist for admission. Final Clinical Impressions(s) / ED Diagnoses   Final diagnoses:  Lower GI bleed  Symptomatic anemia  Patient presents as outlined above.  At this time, family members are not present to give further description of episode of weakness at home.  Patient is neurologically and cognitively intact.  Does have ongoing blood loss with melanotic stool and anemia.  I suspect, patient had episode of weakness with symptomatic anemia.  CT head does not show acute findings.  At this time, will proceed with blood transfusion and admission for ongoing GI blood loss with weakness and mild tachycardia.  ED Discharge Orders    None       Charlesetta Shanks, MD 12/05/17 1304

## 2017-12-05 NOTE — Progress Notes (Signed)
[facility] Gastroenterology Progress Note  [name] 83 y.o. October 11, 1928  CC:  Lower GI bleed   Subjective: this is a 84 year old patient presented to the hospital with weakness and altered mental status. Patient was recently admitted to the hospital with lower GI bleed. She was discharged home on 11/30/2017.patient was brought into emergency room for further evaluation of slurred speech. Patient was found to have maroon color blood on the rectal exam. GI is consulted for further evaluation.  Patient is hard of hearing and somewhat confused. Not able to obtain detailed history from the patient.  ROS : denies abdominal pain, nausea and vomiting. Denies chest pain. Limited review of system.   Objective: Vital signs in last 24 hours: Vitals:   12/05/17 1215 12/05/17 1230  BP: (!) 165/88 (!) 174/93  Pulse: (!) 39 (!) 101  Resp: 18 (!) 22  Temp:    SpO2: 98% 100%    Physical Exam:  General. Elderly and cachectic-appearing patient. HEENT. Normocephalic atraumatic. Dry mucous membranes. Heart. Regular rhythm regular. Murmur present Lungs. Clear to auscultate. Anterior exam only Abdomen. Soft, generalized discomfort on palpation,no peritoneal signs, bowel sounds present. Lower extremity.no edema Neurology. Somewhat confused.  Lab Results: Recent Labs    12/05/17 1108 12/05/17 1115  NA 138 140  K 3.7 3.7  CL 103 102  CO2 24  --   GLUCOSE 153* 151*  BUN 12 12  CREATININE 0.52 0.50  CALCIUM 8.3*  --    Recent Labs    12/05/17 1108  AST 15  ALT 12*  ALKPHOS 79  BILITOT 0.7  PROT 5.3*  ALBUMIN 3.0*   Recent Labs    12/05/17 1108 12/05/17 1115  WBC 8.8  --   NEUTROABS 6.8  --   HGB 7.2* 8.2*  HCT 24.3* 24.0*  MCV 77.4*  --   PLT 375  --    No results for input(s): LABPROT, INR in the last 72 hours.    Assessment/Plan: - GI bleed. Maroon color stool.Patient with a history of recurrent lower GI bleed requiring admission in December 2018 as well as earlier  this month. Last colonoscopy in 2009 showed diverticulosis. - Altered mental status / confusion.  - cachexia  Recommendations -------------------------- - Patient's hemoglobin is relatively stable. She was discharged with hemoglobin of 7.6. Recent hemoglobin is 8.2. - Agree with getting CT abdomen pelvis with IV contrast for further evaluation of cachexia - If evidence of active GI bleed, recommend GI bleeding scan for further evaluation. - monitor H&H. Transfuse to keep hemoglobin around 7-8. - GI will follow  Otis Brace MD, [place] 12/05/2017, 2:13 PM  Contact #  [phone]

## 2017-12-05 NOTE — H&P (Signed)
[facility] History and Physical  [name] ZTI:[id number] DOB: Apr 12, 1929 DOA: 12/05/2017  Referring physician:   PCP: Deland Pretty, MD   Chief Complaint: Weakness  HPI:   84 year old female with a history of anemia with a baseline hemoglobin between 8-9, history of functional diarrhea, history of adenomatous colon polyps, rheumatoid arthritis, type 2 diabetes, hypertension, hypothyroidism who presents to the ED with a chief complaint of slurred speech and profound weakness and unable to get out of bed or raise her arms.  Family requested EMS to bring patient to the ED, because of generalized weakness. They denied noticing any focal neurologic deficits or slurred speech. Patient was alert and oriented when she arrived to the ED. She is a poor historian unable to give a clear history. Apparently the patient has continued to have melena since her discharge. She appears dehydrated, unable to state if she has been eating and drinking well. She was recently admitted 3/8-3/9 follow GI bleeding thought to be diverticular source. Initially hemoglobin was 7.6. Patient's baseline appears to be close to 9. She had one large episode of hematochezia that self resolved. Patient was seen by gastroenterology during previous admission, who recommended no intervention. Patient was discharged home on iron. Patient noted to have rheumatoid arthritis and is currently on chronic prednisone. She denies use of any NSAIDs. She is able to voice that she has diffuse abdominal pain, unable to tell me if she's had any recent weight loss. She appears cachectic with extremely poor nutritional status. ED course BP (!) 174/93   Pulse (!) 101   Temp 97.7 F (36.5 C) (Oral)   Resp (!) 22   Ht 5\' 5"  (1.651 m)   Wt 39.5 kg (87 lb)   SpO2 100%   BMI 14.48 kg/m  Initial hemoglobin 7.2. Patient ordered to have 1 units of packed red blood cells in the ED. Patient is being admitted for further evaluation. It is felt  that the patient's generalized weakness is probably secondary to her ongoing melena and anemia. CT of the head did not show any acute findings. [facility] gastroenterology has been consulted and they will see the patient       Review of Systems: negative for the following   Unable to obtain because of patient's confusion, pertinent positives as listed in history of present illness     Past Medical History:  Diagnosis Date  . Acute bronchitis   . Anemia, unspecified   . Atrial fibrillation ([place])   . Depressive disorder, not elsewhere classified   . Diverticulosis of colon (without mention of hemorrhage)   . Esophageal reflux   . Family history of malignant neoplasm of gastrointestinal tract   . Functional diarrhea   . Hiatal hernia   . History of blood transfusion    "once; related to diverticulitis" (11/29/2017)  . Other and unspecified hyperlipidemia   . Other chest pain   . Other specified cardiac dysrhythmias(427.89)   . Personal history of colonic polyps 07/17/1995   adenomatous polyps  . PONV (postoperative nausea and vomiting)   . Recurrent UTI (urinary tract infection)    "2-3 times in the last 1 1/10yr" (11/29/2017)  . Rheumatoid arthritis ([place])   . Tachycardia, unspecified   . Type II diabetes mellitus ([place])   . Unspecified adverse effect of unspecified drug, medicinal and biological substance   . Unspecified essential hypertension   . Unspecified hypothyroidism      Past Surgical History:  Procedure Laterality Date  . ABDOMINAL HYSTERECTOMY     "  partial"  . CATARACT EXTRACTION Bilateral   . FOREARM FRACTURE SURGERY Right 1940s?  . FRACTURE SURGERY    . ORIF FEMUR FRACTURE  08/30/2012   Procedure: OPEN REDUCTION INTERNAL FIXATION (ORIF) DISTAL FEMUR FRACTURE;  Surgeon: Mauri Pole, MD;  Location: [facility]S;  Service: Orthopedics;  Laterality: Right;  proximal femur  . THYROIDECTOMY, PARTIAL        Social History:  reports that  has never smoked. she has never  used smokeless tobacco. She reports that she drinks about 0.6 oz of alcohol per week. She reports that she does not use drugs.    No Known Allergies  Family History  Problem Relation Age of Onset  . Colon cancer Maternal Grandmother         Prior to Admission medications   Medication Sig Start Date End Date Taking? Authorizing Provider  ciprofloxacin (CIPRO) 250 MG tablet Take 250 mg by mouth 2 (two) times daily.   Yes [provider]  feeding supplement, ENSURE ENLIVE, (ENSURE ENLIVE) LIQD Take 237 mLs by mouth 2 (two) times daily between meals. 11/30/17  Yes Purohit, Konrad Dolores, MD  ferrous sulfate 325 (65 FE) MG EC tablet Take 1 tablet (325 mg total) by mouth daily with breakfast. 11/30/17 11/30/18 Yes Purohit, Konrad Dolores, MD  folic acid (FOLVITE) 1 MG tablet Take 1 mg by mouth at bedtime.    Yes [provider]  leflunomide (ARAVA) 20 MG tablet Take 20 mg by mouth daily. 08/14/17  Yes [provider]  linagliptin (TRADJENTA) 5 MG TABS tablet Take 5 mg by mouth daily.   Yes [provider]  metFORMIN (GLUCOPHAGE-XR) 500 MG 24 hr tablet Take 500-1,000 mg by mouth See admin instructions. Take 2 tablets in the morning, and 1 tablet in the evening 03/07/15  Yes [provider]  omeprazole (PRILOSEC) 40 MG capsule Take 40 mg by mouth daily. 08/14/17  Yes [provider]  Pitavastatin Calcium (LIVALO) 2 MG TABS Take 2 mg by mouth every Monday, Wednesday, and Friday at 6 PM.    Yes [provider]  predniSONE (DELTASONE) 1 MG tablet Take 4 mg by mouth daily with breakfast.    Yes [provider]  SYNTHROID 150 MCG tablet Take 150 mcg by mouth daily before breakfast.  09/30/14  Yes [provider]  TRULICITY 1.61 WR/6.0AV SOPN Inject 0.75 mg into the skin once a week. 08/28/17  Yes [provider]  VOLTAREN 1 % GEL Apply 2 g topically 4 (four) times daily.  11/30/13  Yes [provider]     Physical  Exam: Vitals:   12/05/17 1145 12/05/17 1200 12/05/17 1215 12/05/17 1230  BP: (!) 160/80 (!) 175/76 (!) 165/88 (!) 174/93  Pulse: 96 75 (!) 39 (!) 101  Resp: 16 (!) 24 18 (!) 22  Temp:      TempSrc:      SpO2: 97% 100% 98% 100%  Weight:      Height:            Vitals:   12/05/17 1145 12/05/17 1200 12/05/17 1215 12/05/17 1230  BP: (!) 160/80 (!) 175/76 (!) 165/88 (!) 174/93  Pulse: 96 75 (!) 39 (!) 101  Resp: 16 (!) 24 18 (!) 22  Temp:      TempSrc:      SpO2: 97% 100% 98% 100%  Weight:      Height:       Constitutional: cachetic  Eyes: PERRL, lids and conjunctivae normal ENMT:  Mucous membranes are dry. Posterior pharynx clear of any exudate or lesions.Normal dentition.  Neck: normal, supple, no masses, no thyromegaly Respiratory: clear to auscultation bilaterally, no wheezing, no crackles. Normal respiratory effort. No accessory muscle use.  Cardiovascular: Regular rate and rhythm, no murmurs / rubs / gallops. No extremity edema. 2+ pedal pulses. No carotid bruits.  Abdomen:diffusely tender , no masses palpated. No hepatosplenomegaly. Bowel sounds positive.  Musculoskeletal: no clubbing / cyanosis. No joint deformity upper and lower extremities. Good ROM, no contractures. Normal muscle tone.  Skin: no rashes, lesions, ulcers. No induration Neurologic: CN 2-12 grossly intact. Sensation intact, DTR normal. Strength 5/5 in all 4.  Psychiatric: Normal judgment and insight. Disoriented .     Labs on Admission: I have personally reviewed following labs and imaging studies  CBC: Recent Labs  Lab 11/29/17 0955 11/29/17 1325 11/29/17 1941 11/30/17 0339 11/30/17 1226 12/05/17 1108 12/05/17 1115  WBC 12.5* 11.1* 9.7 8.3  --  8.8  --   NEUTROABS  --   --   --   --   --  6.8  --   HGB 8.8* 8.8* 7.5* 7.6* 7.5* 7.2* 8.2*  HCT 29.7* 28.9* 25.1* 25.9* 25.4* 24.3* 24.0*  MCV 78.0 78.1 77.5* 77.5*  --  77.4*  --   PLT 365 311 325 338  --  375  --     Basic Metabolic  Panel: Recent Labs  Lab 11/29/17 0955 11/30/17 0339 12/05/17 1108 12/05/17 1115  NA 138 141 138 140  K 4.0 3.7 3.7 3.7  CL 102 105 103 102  CO2 26 26 24   --   GLUCOSE 274* 118* 153* 151*  BUN 15 7 12 12   CREATININE 0.48 0.49 0.52 0.50  CALCIUM 8.3* 8.0* 8.3*  --     GFR: Estimated Creatinine Clearance: 30.9 mL/min (by C-G formula based on SCr of 0.5 mg/dL).  Liver Function Tests: Recent Labs  Lab 11/29/17 0955 12/05/17 1108  AST 19 15  ALT 14 12*  ALKPHOS 86 79  BILITOT 0.6 0.7  PROT 5.9* 5.3*  ALBUMIN 2.8* 3.0*   No results for input(s): LIPASE, AMYLASE in the last 168 hours. Recent Labs  Lab 12/05/17 1108  AMMONIA 10    Coagulation Profile: Recent Labs  Lab 11/29/17 0955  INR 1.05   No results for input(s): DDIMER in the last 72 hours.  Cardiac Enzymes: No results for input(s): CKTOTAL, CKMB, CKMBINDEX, TROPONINI in the last 168 hours.  BNP (last 3 results) No results for input(s): PROBNP in the last 8760 hours.  HbA1C: No results for input(s): HGBA1C in the last 72 hours. Lab Results  Component Value Date   HGBA1C 7.5 (H) 11/29/2017   HGBA1C (H) 09/22/2010    6.2 (NOTE)                                                                       According to the ADA Clinical Practice Recommendations for 2011, when HbA1c is used as a screening test:   >=6.5%   Diagnostic of Diabetes Mellitus           (if abnormal result  is confirmed)  5.7-6.4%   Increased risk of developing Diabetes Mellitus  References:Diagnosis and Classification of Diabetes  [place],Diabetes MWUX,3244,01(UUVOZ 1):S62-S69 and Standards of Medical Care in         Diabetes - 2011,Diabetes DGUY,4034,74  (Suppl 1):S11-S61.   HGBA1C 6.1 03/31/2009     CBG: Recent Labs  Lab 11/30/17 0549  GLUCAP 143*    Lipid Profile: No results for input(s): CHOL, HDL, LDLCALC, TRIG, CHOLHDL, LDLDIRECT in the last 72 hours.  Thyroid Function Tests: Recent Labs    12/05/17 1108  TSH 0.251*     Anemia Panel: No results for input(s): VITAMINB12, FOLATE, FERRITIN, TIBC, IRON, RETICCTPCT in the last 72 hours.  Urine analysis:    Component Value Date/Time   COLORURINE YELLOW 07/24/2017 1258   APPEARANCEUR HAZY (A) 07/24/2017 1258   LABSPEC 1.016 07/24/2017 1258   PHURINE 5.0 07/24/2017 1258   GLUCOSEU NEGATIVE 07/24/2017 1258   HGBUR MODERATE (A) 07/24/2017 1258   HGBUR negative 03/31/2009 1047   BILIRUBINUR NEGATIVE 07/24/2017 1258   KETONESUR 20 (A) 07/24/2017 1258   PROTEINUR NEGATIVE 07/24/2017 1258   UROBILINOGEN 0.2 08/28/2012 0543   NITRITE NEGATIVE 07/24/2017 1258   LEUKOCYTESUR TRACE (A) 07/24/2017 1258    Sepsis Labs: @LABRCNTIP (procalcitonin:4,lacticidven:4) )No results found for this or any previous visit (from the past 240 hour(s)).       Radiological Exams on Admission: Dg Chest 2 View  Result Date: 12/05/2017 CLINICAL DATA:  Slurred speech. EXAM: CHEST - 2 VIEW COMPARISON:  06/08/2017.  04/16/2016. FINDINGS: Mediastinum and hilar structures are normal. Cardiomegaly with normal pulmonary vascularity. No focal infiltrate. Mild bilateral chronic interstitial prominence. No change. No pleural effusion or pneumothorax. IMPRESSION: 1. Mild bilateral chronic interstitial prominence consistent chronic interstitial lung disease. No acute pulmonary disease. 2.  Cardiomegaly with normal pulmonary vascularity. Electronically Signed   By: Marcello Moores  Register   On: 12/05/2017 11:39   Ct Head Wo Contrast  Result Date: 12/05/2017 CLINICAL DATA:  Altered level of consciousness. Slurred speech. Unable to raise arms. Recent UTI. No reported injury. EXAM: CT HEAD WITHOUT CONTRAST TECHNIQUE: Contiguous axial images were obtained from the base of the skull through the vertex without intravenous contrast. COMPARISON:  07/24/2017 head CT. FINDINGS: Brain: No evidence of parenchymal hemorrhage or extra-axial fluid collection. No mass lesion, mass effect, or midline shift. No CT  evidence of acute infarction. Nonspecific moderate subcortical and periventricular white matter hypodensity, most in keeping with chronic small vessel ischemic change. Generalized cerebral volume loss. Cerebral ventricle sizes are stable and concordant with the degree of cerebral volume loss. Vascular: No acute abnormality. Skull: No evidence of calvarial fracture. Sinuses/Orbits: The visualized paranasal sinuses are essentially clear. Other:  The mastoid air cells are unopacified. IMPRESSION: 1.  No evidence of acute intracranial abnormality. 2. Generalized cerebral volume loss and moderate chronic small vessel ischemic changes in the cerebral white matter. Electronically Signed   By: Ilona Sorrel M.D.   On: 12/05/2017 11:33   Dg Chest 2 View  Result Date: 12/05/2017 CLINICAL DATA:  Slurred speech. EXAM: CHEST - 2 VIEW COMPARISON:  06/08/2017.  04/16/2016. FINDINGS: Mediastinum and hilar structures are normal. Cardiomegaly with normal pulmonary vascularity. No focal infiltrate. Mild bilateral chronic interstitial prominence. No change. No pleural effusion or pneumothorax. IMPRESSION: 1. Mild bilateral chronic interstitial prominence consistent chronic interstitial lung disease. No acute pulmonary disease. 2.  Cardiomegaly with normal pulmonary vascularity. Electronically Signed   By: Marcello Moores  Register   On: 12/05/2017 11:39   Ct Head Wo Contrast  Result Date: 12/05/2017 CLINICAL DATA:  Altered level of consciousness. Slurred speech. Unable to raise arms. Recent  UTI. No reported injury. EXAM: CT HEAD WITHOUT CONTRAST TECHNIQUE: Contiguous axial images were obtained from the base of the skull through the vertex without intravenous contrast. COMPARISON:  07/24/2017 head CT. FINDINGS: Brain: No evidence of parenchymal hemorrhage or extra-axial fluid collection. No mass lesion, mass effect, or midline shift. No CT evidence of acute infarction. Nonspecific moderate subcortical and periventricular white matter  hypodensity, most in keeping with chronic small vessel ischemic change. Generalized cerebral volume loss. Cerebral ventricle sizes are stable and concordant with the degree of cerebral volume loss. Vascular: No acute abnormality. Skull: No evidence of calvarial fracture. Sinuses/Orbits: The visualized paranasal sinuses are essentially clear. Other:  The mastoid air cells are unopacified. IMPRESSION: 1.  No evidence of acute intracranial abnormality. 2. Generalized cerebral volume loss and moderate chronic small vessel ischemic changes in the cerebral white matter. Electronically Signed   By: Ilona Sorrel M.D.   On: 12/05/2017 11:33     Sinus tachycardia Supraventricular bigeminy Probable anterior infarct, age indeterminate Lateral leads are also involved agree. no sig change from previous  EKG: Independently reviewed   Assessment/Plan Active Problems:   Hypothyroidism   Type 2 diabetes mellitus with other specified complication (HCC)   Essential hypertension   History of gastroesophageal reflux (GERD)   Lower GI bleeding   Diabetic ulcer of left foot associated with type 2 diabetes mellitus (HCC)   Lower GI bleed   GI bleed   Protein-calorie malnutrition, severe     Melena Suspected to be diverticular however the patient appears cachectic, with a BMI of 14 Will obtain CT abdomen pelvis to r/o underlying malignancy She also takes prednisone on a chronic basis therefore could have an upper GI source of bleeding Will transfuse 1 units of packed red blood cells Start patient on Protonix 40 mg IV twice a day [facility]ology has been consulted  Generalized weakness No focal deficits to suggest a stroke Speech is clear without any unilateral weakness If symptoms recur consider checking an MRI of the brain  Type 2 diabetes, hold metformin and  Hold metformin and trajenta - sensitive sliding scale insulin for now next  Severe protein calorie malnutrition Patient was recently  discharged and show supplements next  History of rheumatoid arthritis and osteoarthritis and patient takes prednisone on a chronic basis which will be continued She is very deconditioned and will need PT OT evaluation  History of recent syncope Likely the setting of hematochezia and moderate aortic stenosis seen on 2-D echo with a valve area 1.4 cm. She will need outpatient cardiology evaluation  Hypothyroidism, most recent TSH was low, checking free T4, hold levothyroxine for now and resume prior to discharge   Hypertension-when necessary hydralazine for systolic greater than 209     DVT prophylaxis:  scd's      Code Status Orders full code   (From admission, onward)       consults called:  Family Communication: Admission, patients condition and plan of care including tests being ordered have been discussed with the patient  who indicates understanding and agree with the plan and Code Status  Admission status: inpatient    Disposition plan: Further plan will depend as patient's clinical course evolves and further radiologic and laboratory data become available. Likely home when stable   At the time of admission, it appears that the appropriate admission status for this patient is INPATIENT .Thisis judged to be reasonable and necessary in order to provide the required intensity of service to ensure the patient's  safetygiven thepresenting symptoms, physical exam findings, and initial radiographic and laboratory data in the context of their chronic comorbidities.   Reyne Dumas MD [facility] Pager [phone]  If 7PM-7AM, please contact night-coverage [email or web address] Password [email or web address]  12/05/2017, 1:48 PM

## 2017-12-05 NOTE — ED Triage Notes (Signed)
Per [facility] patient coming from home after family called out due to patient having slurred speech and unable to raise arms. Recently been treated for a uti. Here last week for a gi bleed. No neuro deficits or slurred speech upon ems arrival. Patient alert and oriented x4. LSN per family 0800.

## 2017-12-05 NOTE — ED Notes (Signed)
Attempted report 

## 2017-12-05 NOTE — ED Notes (Signed)
Pt CBG 170, notified [employer], corporate)

## 2017-12-06 DIAGNOSIS — K284 Chronic or unspecified gastrojejunal ulcer with hemorrhage: Secondary | ICD-10-CM

## 2017-12-06 DIAGNOSIS — E119 Type 2 diabetes mellitus without complications: Secondary | ICD-10-CM | POA: Diagnosis not present

## 2017-12-06 DIAGNOSIS — D649 Anemia, unspecified: Secondary | ICD-10-CM

## 2017-12-06 DIAGNOSIS — D509 Iron deficiency anemia, unspecified: Secondary | ICD-10-CM | POA: Diagnosis not present

## 2017-12-06 DIAGNOSIS — I10 Essential (primary) hypertension: Secondary | ICD-10-CM | POA: Diagnosis not present

## 2017-12-06 DIAGNOSIS — E11621 Type 2 diabetes mellitus with foot ulcer: Secondary | ICD-10-CM | POA: Diagnosis not present

## 2017-12-06 DIAGNOSIS — E039 Hypothyroidism, unspecified: Secondary | ICD-10-CM | POA: Diagnosis not present

## 2017-12-06 DIAGNOSIS — M810 Age-related osteoporosis without current pathological fracture: Secondary | ICD-10-CM | POA: Diagnosis not present

## 2017-12-06 DIAGNOSIS — M0579 Rheumatoid arthritis with rheumatoid factor of multiple sites without organ or systems involvement: Secondary | ICD-10-CM | POA: Diagnosis not present

## 2017-12-06 DIAGNOSIS — E1149 Type 2 diabetes mellitus with other diabetic neurological complication: Secondary | ICD-10-CM | POA: Diagnosis not present

## 2017-12-06 DIAGNOSIS — E7800 Pure hypercholesterolemia, unspecified: Secondary | ICD-10-CM | POA: Diagnosis not present

## 2017-12-06 LAB — COMPREHENSIVE METABOLIC PANEL
ALT: 11 U/L — ABNORMAL LOW (ref 14–54)
ANION GAP: 11 (ref 5–15)
AST: 15 U/L (ref 15–41)
Albumin: 2.6 g/dL — ABNORMAL LOW (ref 3.5–5.0)
Alkaline Phosphatase: 71 U/L (ref 38–126)
BILIRUBIN TOTAL: 0.8 mg/dL (ref 0.3–1.2)
BUN: 7 mg/dL (ref 6–20)
CALCIUM: 7.9 mg/dL — AB (ref 8.9–10.3)
CO2: 22 mmol/L (ref 22–32)
Chloride: 105 mmol/L (ref 101–111)
Creatinine, Ser: 0.53 mg/dL (ref 0.44–1.00)
GFR calc Af Amer: >60 mL/min (ref >60)
GFR calc non Af Amer: >60 mL/min (ref >60)
Glucose, Bld: 128 mg/dL — ABNORMAL HIGH (ref 65–99)
POTASSIUM: 3.4 mmol/L — AB (ref 3.5–5.1)
Sodium: 138 mmol/L (ref 135–145)
Total Protein: 5.4 g/dL — ABNORMAL LOW (ref 6.5–8.1)

## 2017-12-06 LAB — CBC
HEMATOCRIT: 28.7 % — AB (ref 36.0–46.0)
Hemoglobin: 8.9 g/dL — ABNORMAL LOW (ref 12.0–15.0)
MCH: 24.3 pg — ABNORMAL LOW (ref 26.0–34.0)
MCHC: 31 g/dL (ref 30.0–36.0)
MCV: 78.4 fL (ref 78.0–100.0)
Platelets: 317 10*3/uL (ref 150–400)
RBC: 3.66 MIL/uL — ABNORMAL LOW (ref 3.87–5.11)
RDW: 17.9 % — AB (ref 11.5–15.5)
WBC: 6.2 10*3/uL (ref 4.0–10.5)

## 2017-12-06 LAB — GLUCOSE, CAPILLARY
GLUCOSE-CAPILLARY: 138 mg/dL — AB (ref 65–99)
GLUCOSE-CAPILLARY: 190 mg/dL — AB (ref 65–99)
GLUCOSE-CAPILLARY: 258 mg/dL — AB (ref 65–99)
GLUCOSE-CAPILLARY: 88 mg/dL (ref 65–99)
GLUCOSE-CAPILLARY: 91 mg/dL (ref 65–99)
Glucose-Capillary: 38 mg/dL — CL (ref 65–99)
Glucose-Capillary: 82 mg/dL (ref 65–99)

## 2017-12-06 MED ORDER — POTASSIUM CHLORIDE CRYS ER 20 MEQ PO TBCR
40.0000 meq | EXTENDED_RELEASE_TABLET | Freq: Once | ORAL | Status: AC
  Administered 2017-12-06: 40 meq via ORAL
  Filled 2017-12-06: qty 2

## 2017-12-06 MED ORDER — MAGNESIUM OXIDE 400 (241.3 MG) MG PO TABS
200.0000 mg | ORAL_TABLET | Freq: Two times a day (BID) | ORAL | Status: DC
  Administered 2017-12-06: 200 mg via ORAL
  Filled 2017-12-06 (×2): qty 1

## 2017-12-06 MED ORDER — DEXTROSE 50 % IV SOLN
25.0000 mL | Freq: Once | INTRAVENOUS | Status: AC
  Administered 2017-12-06: 25 mL via INTRAVENOUS
  Filled 2017-12-06: qty 50

## 2017-12-06 MED ORDER — PANTOPRAZOLE SODIUM 40 MG PO TBEC: 40.0000 mg | DELAYED_RELEASE_TABLET | Freq: Every day | ORAL | Status: DC

## 2017-12-06 MED ORDER — PANTOPRAZOLE SODIUM 40 MG PO TBEC
40.0000 mg | DELAYED_RELEASE_TABLET | Freq: Two times a day (BID) | ORAL | Status: DC
Start: 2017-12-06 — End: 2017-12-07
  Administered 2017-12-06: 40 mg via ORAL
  Filled 2017-12-06 (×2): qty 1

## 2017-12-06 MED ORDER — HALOPERIDOL LACTATE 5 MG/ML IJ SOLN
2.5000 mg | Freq: Four times a day (QID) | INTRAMUSCULAR | Status: DC | PRN
  Filled 2017-12-06: qty 1

## 2017-12-06 NOTE — Progress Notes (Signed)
Inpatient Diabetes Program Recommendations  AACE/ADA: New Consensus Statement on Inpatient Glycemic Control (2015)  Target Ranges:  Prepandial:   less than 140 mg/dL      Peak postprandial:   less than 180 mg/dL (1-2 hours)      Critically ill patients:  140 - 180 mg/dL   Results for [name], [name] (MRN [id number]) as of 12/06/2017 10:52  Ref. Range 12/05/2017 16:55 12/05/2017 20:32 12/06/2017 00:20 12/06/2017 03:52 12/06/2017 04:49 12/06/2017 08:07  Glucose-Capillary Latest Ref Range: 65 - 99 mg/dL 147 (H) Novolog 1 unit given 116 (H) 88 38 (LL) 138 (H) 91   Review of Glycemic Control  Diabetes history: DM 2 Outpatient Diabetes medications: Tradjenta 5 mg Daily, Metformin 1000 mg qam, 326 mg qpm, Trulicity 7.12 mg weekly Current orders for Inpatient glycemic control: Novolog Sensitive Correction 0-9 units Q4 hours  Inpatient Diabetes Program Recommendations:    A1c 7.5% on 11/29/17  Hypoglycemia 38 mg/dl at 0400 am this am. Could consider ordering a custom Novolog Correction scale starting coverage at 150.  -Custom Novolog correction scale 0-5 units       151-200  1 unit      201-250  2 units      251-300  3 units      301-350  4 units      351-400  5 units  Thanks,  Tama Headings RN, MSN, BC-ADM, [facility] Inpatient Diabetes Coordinator Team Pager [phone] (8a-5p)

## 2017-12-06 NOTE — Plan of Care (Signed)
progressing 

## 2017-12-06 NOTE — Progress Notes (Signed)
PROGRESS NOTE    [name]  BDZ:[id number] DOB: 1929-09-07 DOA: 12/05/2017 PCP: Deland Pretty, MD   Brief Narrative: by Dr Allyson Sabal : ''HPI:   84 year old female with a history of anemia with a baseline hemoglobin between 8-9, history of functional diarrhea, history of adenomatous colon polyps, rheumatoid arthritis, type 2 diabetes, hypertension, hypothyroidism who presents to the ED with a chief complaint of slurred speech and profound weakness and unable to get out of bed or raise her arms.  Family requested EMS to bring patient to the ED, because of generalized weakness. They denied noticing any focal neurologic deficits or slurred speech. Patient was alert and oriented when she arrived to the ED. She is a poor historian unable to give a clear history. Apparently the patient has continued to have melena since her discharge. She appears dehydrated, unable to state if she has been eating and drinking well. She was recently admitted 3/8-3/9 follow GI bleeding thought to be diverticular source. Initially hemoglobin was 7.6. Patient's baseline appears to be close to 9. She had one large episode of hematochezia that self resolved. Patient was seen by gastroenterology during previous admission, who recommended no intervention. Patient was discharged home on iron. Patient noted to have rheumatoid arthritis and is currently on chronic prednisone. She denies use of any NSAIDs. She is able to voice that she has diffuse abdominal pain, unable to tell me if she's had any recent weight loss. She appears cachectic with extremely poor nutritional status. ED course BP (!) 174/93  Pulse (!) 101  Temp 97.7 F (36.5 C) (Oral)  Resp (!) 22  Ht 5\' 5"  (1.651 m)  Wt 39.5 kg (87 lb)  SpO2 100%  BMI 14.48 kg/m  Initial hemoglobin 7.2. Patient ordered to have 1 units of packed red blood cells in the ED. Patient is being admitted for further evaluation. It is felt that the patient's generalized weakness is  probably secondary to her ongoing melena and anemia. CT of the head did not show any acute findings. [facility] gastroenterology has been consulted and they will see the patient "      Assessment & Plan:   Active Problems:   Hypothyroidism   Type 2 diabetes mellitus with other specified complication (HCC)   Essential hypertension   History of gastroesophageal reflux (GERD)   Lower GI bleeding   Diabetic ulcer of left foot associated with type 2 diabetes mellitus (HCC)   Lower GI bleed   GI bleed   Protein-calorie malnutrition, severe   1-melena; Anemia;  Presents with hb at 7.  Received one unit PRBC>  CT abdomen; diffuse diverticulosis, no diverticulitis.  IV protonix.  Per GI ; recommend bleeding scan if re bleed.  Resume diet.   Generalized weakness;  Need PT.   DM;  Hold metformin and trajenta.   RA; continue with prednisone.   Hypothyroidism;  Low TSH; o.25   Check free T 3 and T 4. Resume synthroid depending on results.   Hypokalemia; replete orally.  Hypomagnesemia; received IV magnesium. Replete orally.   DVT prophylaxis: SCD Code Status: Full code.  Family Communication: none at bedside.  Disposition Plan: home , she relates she has 24 hours care.   Consultants:   GI   Procedures: none  Antimicrobials: none  Subjective: She is feeling ok, does not wants any sx, procedure.  Wants to go home   Objective: Vitals:   12/05/17 1803 12/05/17 2049 12/06/17 0531 12/06/17 1443  BP: 137/75 (!) 145/71 133/65 117/62  Pulse: 99 (!) 118 84 91  Resp: 18 16 12 16   Temp: 98.3 F (36.8 C) (!) 97.4 F (36.3 C) 97.6 F (36.4 C) 97.9 F (36.6 C)  TempSrc: Oral Oral Oral Oral  SpO2: 97% 100% 96% 95%  Weight:      Height:        Intake/Output Summary (Last 24 hours) at 12/06/2017 1625 Last data filed at 12/06/2017 1110 Gross per 24 hour  Intake 315 ml  Output 500 ml  Net -185 ml   Filed Weights   12/05/17 1048  Weight: 39.5 kg (87 lb)     Examination:  General exam: Appears calm and comfortable , cachetic./  Respiratory system: Clear to auscultation. Respiratory effort normal. Cardiovascular system: S1 & S2 heard, RRR. No JVD, murmurs, rubs, gallops or clicks. No pedal edema. Gastrointestinal system: Abdomen is nondistended, soft and nontender. No organomegaly or masses felt. Normal bowel sounds heard. Central nervous system: Alert and oriented. Extremities: Symmetric 5 x 5 power. Skin: No rashes, lesions or ulcers    Data Reviewed: I have personally reviewed following labs and imaging studies  CBC: Recent Labs  Lab 11/29/17 1941 11/30/17 0339 11/30/17 1226 12/05/17 1108 12/05/17 1115 12/06/17 0507  WBC 9.7 8.3  --  8.8  --  6.2  NEUTROABS  --   --   --  6.8  --   --   HGB 7.5* 7.6* 7.5* 7.2* 8.2* 8.9*  HCT 25.1* 25.9* 25.4* 24.3* 24.0* 28.7*  MCV 77.5* 77.5*  --  77.4*  --  78.4  PLT 325 338  --  375  --  573   Basic Metabolic Panel: Recent Labs  Lab 11/30/17 0339 12/05/17 1108 12/05/17 1115 12/06/17 0507  NA 141 138 140 138  K 3.7 3.7 3.7 3.4*  CL 105 103 102 105  CO2 26 24  --  22  GLUCOSE 118* 153* 151* 128*  BUN 7 12 12 7   CREATININE 0.49 0.52 0.50 0.53  CALCIUM 8.0* 8.3*  --  7.9*  MG  --  1.3*  --   --    GFR: Estimated Creatinine Clearance: 30.9 mL/min (by C-G formula based on SCr of 0.53 mg/dL). Liver Function Tests: Recent Labs  Lab 12/05/17 1108 12/06/17 0507  AST 15 15  ALT 12* 11*  ALKPHOS 79 71  BILITOT 0.7 0.8  PROT 5.3* 5.4*  ALBUMIN 3.0* 2.6*   No results for input(s): LIPASE, AMYLASE in the last 168 hours. Recent Labs  Lab 12/05/17 1108  AMMONIA 10   Coagulation Profile: No results for input(s): INR, PROTIME in the last 168 hours. Cardiac Enzymes: No results for input(s): CKTOTAL, CKMB, CKMBINDEX, TROPONINI in the last 168 hours. BNP (last 3 results) No results for input(s): PROBNP in the last 8760 hours. HbA1C: No results for input(s): HGBA1C in the  last 72 hours. CBG: Recent Labs  Lab 12/06/17 0020 12/06/17 0352 12/06/17 0449 12/06/17 0807 12/06/17 1234  GLUCAP 88 38* 138* 91 82   Lipid Profile: No results for input(s): CHOL, HDL, LDLCALC, TRIG, CHOLHDL, LDLDIRECT in the last 72 hours. Thyroid Function Tests: Recent Labs    12/05/17 1108  TSH 0.251*   Anemia Panel: No results for input(s): VITAMINB12, FOLATE, FERRITIN, TIBC, IRON, RETICCTPCT in the last 72 hours. Sepsis Labs: No results for input(s): PROCALCITON, LATICACIDVEN in the last 168 hours.  No results found for this or any previous visit (from the past 240 hour(s)).       Radiology Studies: Dg  Chest 2 View  Result Date: 12/05/2017 CLINICAL DATA:  Slurred speech. EXAM: CHEST - 2 VIEW COMPARISON:  06/08/2017.  04/16/2016. FINDINGS: Mediastinum and hilar structures are normal. Cardiomegaly with normal pulmonary vascularity. No focal infiltrate. Mild bilateral chronic interstitial prominence. No change. No pleural effusion or pneumothorax. IMPRESSION: 1. Mild bilateral chronic interstitial prominence consistent chronic interstitial lung disease. No acute pulmonary disease. 2.  Cardiomegaly with normal pulmonary vascularity. Electronically Signed   By: Marcello Moores  Register   On: 12/05/2017 11:39   Ct Head Wo Contrast  Result Date: 12/05/2017 CLINICAL DATA:  Altered level of consciousness. Slurred speech. Unable to raise arms. Recent UTI. No reported injury. EXAM: CT HEAD WITHOUT CONTRAST TECHNIQUE: Contiguous axial images were obtained from the base of the skull through the vertex without intravenous contrast. COMPARISON:  07/24/2017 head CT. FINDINGS: Brain: No evidence of parenchymal hemorrhage or extra-axial fluid collection. No mass lesion, mass effect, or midline shift. No CT evidence of acute infarction. Nonspecific moderate subcortical and periventricular white matter hypodensity, most in keeping with chronic small vessel ischemic change. Generalized cerebral volume  loss. Cerebral ventricle sizes are stable and concordant with the degree of cerebral volume loss. Vascular: No acute abnormality. Skull: No evidence of calvarial fracture. Sinuses/Orbits: The visualized paranasal sinuses are essentially clear. Other:  The mastoid air cells are unopacified. IMPRESSION: 1.  No evidence of acute intracranial abnormality. 2. Generalized cerebral volume loss and moderate chronic small vessel ischemic changes in the cerebral white matter. Electronically Signed   By: Ilona Sorrel M.D.   On: 12/05/2017 11:33   Ct Abdomen Pelvis W Contrast  Result Date: 12/06/2017 CLINICAL DATA:  Acute onset of anemia. Assess for hematoma or other source of blood loss. EXAM: CT ABDOMEN AND PELVIS WITH CONTRAST TECHNIQUE: Multidetector CT imaging of the abdomen and pelvis was performed using the standard protocol following bolus administration of intravenous contrast. CONTRAST:  16mL ISOVUE-300 IOPAMIDOL (ISOVUE-300) INJECTION 61% COMPARISON:  Lumbar spine radiographs from 04/16/2016 FINDINGS: Lower chest: Trace bilateral pleural fluid is noted, with bibasilar scarring and fibrotic change. Diffuse coronary artery calcifications are seen. Hepatobiliary: The liver is unremarkable in appearance. The gallbladder is unremarkable in appearance. The common bile duct remains normal in caliber. Pancreas: The pancreas is within normal limits. Spleen: The spleen is unremarkable in appearance. Adrenals/Urinary Tract: The adrenal glands are unremarkable in appearance. Mild bilateral renal pelvicaliectasis is noted. There is no evidence of significant hydronephrosis. No renal or ureteral stones are identified. No significant perinephric stranding is seen. Stomach/Bowel: The stomach is unremarkable in appearance. The small bowel is within normal limits. The appendix is normal in caliber, without evidence of appendicitis. Diffuse diverticulosis is noted along the entirety of the colon, without evidence of acute  diverticulitis. Vascular/Lymphatic: Diffuse calcification is seen along the abdominal aorta and its branches. The abdominal aorta is otherwise grossly unremarkable. The inferior vena cava is grossly unremarkable. No retroperitoneal lymphadenopathy is seen. No pelvic sidewall lymphadenopathy is identified. Reproductive: The bladder is mildly distended and grossly unremarkable. The patient is status post hysterectomy. No suspicious adnexal masses are seen. Other: No additional soft tissue abnormalities are seen. Musculoskeletal: No acute osseous abnormalities are identified. Chronic compression deformity is noted at L2. Multilevel vacuum phenomenon is noted along the lumbar spine. The right femoral screw appears grossly intact, though incompletely imaged. Vacuum phenomenon is noted at the sacroiliac joints. The visualized musculature is unremarkable in appearance. IMPRESSION: 1. No acute abnormality seen to explain the patient's symptoms. 2. Trace bilateral pleural fluid, with  bibasilar scarring and fibrotic change. 3. Diffuse coronary artery calcifications. 4. Diffuse diverticulosis along the entirety of the colon, without evidence of diverticulitis. 5. Chronic compression deformity at L2. Aortic Atherosclerosis (ICD10-I70.0). Electronically Signed   By: Garald Balding M.D.   On: 12/06/2017 02:47        Scheduled Meds: . ferrous sulfate  325 mg Oral Q breakfast  . folic acid  1 mg Oral QHS  . insulin aspart  0-9 Units Subcutaneous Q4H  . pantoprazole  40 mg Oral Daily   Continuous Infusions:   LOS: 1 day    Time spent: 35 minutes.     Elmarie Shiley, MD [facility] Pager [phone]  If 7PM-7AM, please contact night-coverage [email or web address] Password [facility] 12/06/2017, 4:25 PM

## 2017-12-06 NOTE — Progress Notes (Addendum)
[facility] Gastroenterology Progress Note  [name] 84 y.o. 1928/12/05  CC:  GI bleed   Subjective: patient remains somewhat confused today. Denies abdominal pain. Discussed with the nursing staff. No documented bleeding episode, black stool or bright blood per rectum.   ROS : Not able to obtain   Objective: Vital signs in last 24 hours: Vitals:   12/05/17 2049 12/06/17 0531  BP: (!) 145/71 133/65  Pulse: (!) 118 84  Resp: 16 12  Temp: (!) 97.4 F (36.3 C) 97.6 F (36.4 C)  SpO2: 100% 96%    Physical Exam:  General. Cachectic/frail-appearing elderly patient. Not in acute distress Heart. Rate is regular. Murmur present Abdomen. Soft, nontender, nondistended, bowel sounds present. No peritoneal signs Extremities. No edema Neuro -  Confused. Lab Results: Recent Labs    12/05/17 1108 12/05/17 1115 12/06/17 0507  NA 138 140 138  K 3.7 3.7 3.4*  CL 103 102 105  CO2 24  --  22  GLUCOSE 153* 151* 128*  BUN 12 12 7   CREATININE 0.52 0.50 0.53  CALCIUM 8.3*  --  7.9*  MG 1.3*  --   --    Recent Labs    12/05/17 1108 12/06/17 0507  AST 15 15  ALT 12* 11*  ALKPHOS 79 71  BILITOT 0.7 0.8  PROT 5.3* 5.4*  ALBUMIN 3.0* 2.6*   Recent Labs    12/05/17 1108 12/05/17 1115 12/06/17 0507  WBC 8.8  --  6.2  NEUTROABS 6.8  --   --   HGB 7.2* 8.2* 8.9*  HCT 24.3* 24.0* 28.7*  MCV 77.4*  --  78.4  PLT 375  --  317   No results for input(s): LABPROT, INR in the last 72 hours.    Assessment/Plan: - recurrent lower GI bleed  requiring admission in December 2018 as well as earlier this month. Last colonoscopy in 2009 showed diverticulosis.Resolved at this time. Hemoglobin stable. - Altered mental status / confusion.  - cachexia  Recommendations -------------------------- - Patient's hemoglobin is stable. Discussed with the nursing staff. Denied any bleeding episodes since admission. - she is  somewhat confused and she is declining any colonoscopy or further  intervention. - Patient is  84 year old and appears frail and cachectic. - Recommend bleeding scan if she gets readmitted with lower GI bleed. - started heart healthy diet. Discussed with Dr. Tyrell Antonio  - GI will sign off. Call us back if needed    Otis Brace MD, [place] 12/06/2017, 12:30 PM  Contact #  [phone]

## 2017-12-07 LAB — CBC
HEMATOCRIT: 31.3 % — AB (ref 36.0–46.0)
HEMOGLOBIN: 9.5 g/dL — AB (ref 12.0–15.0)
MCH: 23.9 pg — ABNORMAL LOW (ref 26.0–34.0)
MCHC: 30.4 g/dL (ref 30.0–36.0)
MCV: 78.8 fL (ref 78.0–100.0)
Platelets: 358 10*3/uL (ref 150–400)
RBC: 3.97 MIL/uL (ref 3.87–5.11)
RDW: 17.9 % — AB (ref 11.5–15.5)
WBC: 7.7 10*3/uL (ref 4.0–10.5)

## 2017-12-07 LAB — T4, FREE: FREE T4: 1.54 ng/dL — AB (ref 0.61–1.12)

## 2017-12-07 LAB — GLUCOSE, CAPILLARY
Glucose-Capillary: 108 mg/dL — ABNORMAL HIGH (ref 65–99)
Glucose-Capillary: 124 mg/dL — ABNORMAL HIGH (ref 65–99)
Glucose-Capillary: 157 mg/dL — ABNORMAL HIGH (ref 65–99)
Glucose-Capillary: 86 mg/dL (ref 65–99)

## 2017-12-07 MED ORDER — MELATONIN 2.5 MG PO CAPS: 2.5000 mg | ORAL_CAPSULE | Freq: Every evening | ORAL | 0 refills | Status: AC | PRN

## 2017-12-07 MED ORDER — FERROUS SULFATE 325 (65 FE) MG PO TBEC: 325.0000 mg | DELAYED_RELEASE_TABLET | Freq: Two times a day (BID) | ORAL | 3 refills | Status: DC

## 2017-12-07 MED ORDER — MAGNESIUM OXIDE 400 (241.3 MG) MG PO TABS: 200.0000 mg | ORAL_TABLET | Freq: Two times a day (BID) | ORAL | 0 refills | Status: DC

## 2017-12-07 MED ORDER — LEVOTHYROXINE SODIUM 100 MCG PO TABS: 100.0000 ug | ORAL_TABLET | Freq: Every day | ORAL | 11 refills | Status: AC

## 2017-12-07 MED ORDER — OMEPRAZOLE 40 MG PO CPDR: 40.0000 mg | DELAYED_RELEASE_CAPSULE | Freq: Two times a day (BID) | ORAL | 0 refills | Status: DC

## 2017-12-07 NOTE — Plan of Care (Signed)
Progressing

## 2017-12-07 NOTE — Progress Notes (Signed)
Pt given discharge instructions, prescriptions, and care notes. Pt caregiver verbalized understanding AEB no further questions or concerns at this time. IV was discontinued, no redness, pain, or swelling noted at this time. Telemetry discontinued and Centralized Telemetry was notified. Pt left the floor via wheelchair with caregiver in stable condition.

## 2017-12-07 NOTE — Progress Notes (Signed)
[name], [name] 84 y o f fall this morning at 0515 without any injury. Patient was confused before fall. RN was the wittiness during fall. Vital signs are stable. Notified the on call MD and leave  voice to patient"s  son [name] . Completed the fall check list.

## 2017-12-07 NOTE — Discharge Summary (Signed)
Physician Discharge Summary  [name]:[id number] DOB: 02/20/1929 DOA: 12/05/2017  PCP: Deland Pretty, MD  Admit date: 12/05/2017 Discharge date: 12/07/2017  Admitted From: Home.  Disposition:  Home   Recommendations for Outpatient Follow-up:  1. Follow up with PCP in 1-2 weeks 2. Please obtain BMP/CBC in one week 3. Needs CBC to follow hb.  4. Further GI evaluation out patient if family wishes  5. Repeat TSH and Free T 3, 4 in 4 weeks. Her synthroid dose was reduce.   Home Health: no, patient has 24 hours care.   Discharge Condition: Stable.  CODE STATUS: Full code.  Diet recommendation: Heart Healthy  Brief/Interim Summary: Brief Narrative: by Dr Allyson Sabal : ''HPI: 84 year old female with a history of anemia with a baseline hemoglobin between 8-9, history of functional diarrhea, history of adenomatous colon polyps, rheumatoid arthritis, type 2 diabetes, hypertension, hypothyroidism who presents to the ED with a chief complaint of slurred speech and profound weakness and unable to get out of bed or raise her arms. Family requested EMS to bring patient to the ED, because of generalized weakness. They denied noticing any focal neurologic deficits or slurred speech. Patient was alert and oriented when she arrived to the ED. She is a poor historian unable to give a clear history. Apparently the patient has continued to have melena since her discharge. She appears dehydrated, unable to state if she has been eating and drinking well.She was recently admitted 3/8-3/9 follow GI bleeding thought to be diverticular source. Initially hemoglobin was 7.6. Patient's baseline appears to be close to 9. She had one large episode of hematochezia that self resolved. Patient was seen by gastroenterologyduring previous admission, who recommended no intervention. Patient was discharged home on iron. Patient noted to have rheumatoid arthritis and is currently on chronic prednisone. She denies use of  any NSAIDs. She is able to voice that she has diffuse abdominal pain, unable to tell me if she's had any recent weight loss. She appears cachectic with extremely poor nutritional status. ED course BP (!) 174/93  Pulse (!) 101  Temp 97.7 F (36.5 C) (Oral)  Resp (!) 22  Ht 5\' 5"  (1.651 m)  Wt 39.5 kg (87 lb)  SpO2 100%  BMI 14.48 kg/m Initial hemoglobin 7.2. Patient ordered to have1units of packed red blood cells in the ED. Patient is being admitted for further evaluation. It is felt that the patient's generalized weakness is probably secondary to her ongoing melena and anemia. CT of the head did not show any acute findings. [facility] gastroenterology has been consulted and they will see the patient "      Assessment & Plan:   Active Problems:   Hypothyroidism   Type 2 diabetes mellitus with other specified complication (HCC)   Essential hypertension   History of gastroesophageal reflux (GERD)   Lower GI bleeding   Diabetic ulcer of left foot associated with type 2 diabetes mellitus (HCC)   Lower GI bleed   GI bleed   Protein-calorie malnutrition, severe   1-Melena; Anemia;  Presents with hb at 7.  Received one unit PRBC> hb increase to 9.  CT abdomen; diffuse diverticulosis, no diverticulitis.  Discharge on PPI BID>  Per GI ; recommend bleeding scan if re bleed.  Resume diet.  No evidence of further bleeding.  Family to follow up with out patient GI if the wish further evaluation.  CBC next week by PCP.   Generalized weakness;  Improved. Back home with 24 hours care.  DM;  Will only resume metformin. Will stop her other DM medication. CBG not significantly elevated.    RA; continue with prednisone.   Hypothyroidism;  Low TSH; o.25   Check free T 3 and T 4. Resume synthroid at reduce dose.   Hypokalemia; replete orally.  Hypomagnesemia; received IV magnesium. Replete orally.      Discharge Diagnoses:     Hypothyroidism   Type 2  diabetes mellitus with other specified complication ([place])   Essential hypertension   History of gastroesophageal reflux (GERD)   Lower GI bleeding   Diabetic ulcer of left foot associated with type 2 diabetes mellitus ([place])   Lower GI bleed   GI bleed   Protein-calorie malnutrition, severe    Discharge Instructions  Discharge Instructions    Diet - low sodium heart healthy   Complete by:  As directed    Increase activity slowly   Complete by:  As directed      Allergies as of 12/07/2017   No Known Allergies     Medication List    STOP taking these medications   ciprofloxacin 250 MG tablet Commonly known as:  CIPRO   linagliptin 5 MG Tabs tablet Commonly known as:  TRADJENTA   TRULICITY 8.41 YS/0.6TK Sopn Generic drug:  Dulaglutide     TAKE these medications   feeding supplement (ENSURE ENLIVE) Liqd Take 237 mLs by mouth 2 (two) times daily between meals.   ferrous sulfate 325 (65 FE) MG EC tablet Take 1 tablet (325 mg total) by mouth 2 (two) times daily. What changed:  when to take this   folic acid 1 MG tablet Commonly known as:  FOLVITE Take 1 mg by mouth at bedtime.   leflunomide 20 MG tablet Commonly known as:  ARAVA Take 20 mg by mouth daily.   levothyroxine 100 MCG tablet Commonly known as:  SYNTHROID Take 1 tablet (100 mcg total) by mouth daily. What changed:    medication strength  how much to take  when to take this   LIVALO 2 MG Tabs Generic drug:  Pitavastatin Calcium Take 2 mg by mouth every Monday, Wednesday, and Friday at 6 PM.   magnesium oxide 400 (241.3 Mg) MG tablet Commonly known as:  MAG-OX Take 0.5 tablets (200 mg total) by mouth 2 (two) times daily.   metFORMIN 500 MG 24 hr tablet Commonly known as:  GLUCOPHAGE-XR Take 500-1,000 mg by mouth See admin instructions. Take 2 tablets in the morning, and 1 tablet in the evening   omeprazole 40 MG capsule Commonly known as:  PRILOSEC Take 1 capsule (40 mg total) by mouth 2  (two) times daily. What changed:  when to take this   predniSONE 1 MG tablet Commonly known as:  DELTASONE Take 4 mg by mouth daily with breakfast.   VOLTAREN 1 % Gel Generic drug:  diclofenac sodium Apply 2 g topically 4 (four) times daily.       No Known Allergies  Consultations:  GI   Procedures/Studies: Dg Chest 2 View  Result Date: 12/05/2017 CLINICAL DATA:  Slurred speech. EXAM: CHEST - 2 VIEW COMPARISON:  06/08/2017.  04/16/2016. FINDINGS: Mediastinum and hilar structures are normal. Cardiomegaly with normal pulmonary vascularity. No focal infiltrate. Mild bilateral chronic interstitial prominence. No change. No pleural effusion or pneumothorax. IMPRESSION: 1. Mild bilateral chronic interstitial prominence consistent chronic interstitial lung disease. No acute pulmonary disease. 2.  Cardiomegaly with normal pulmonary vascularity. Electronically Signed   By: Marcello Moores  Register   On:  12/05/2017 11:39   Ct Head Wo Contrast  Result Date: 12/05/2017 CLINICAL DATA:  Altered level of consciousness. Slurred speech. Unable to raise arms. Recent UTI. No reported injury. EXAM: CT HEAD WITHOUT CONTRAST TECHNIQUE: Contiguous axial images were obtained from the base of the skull through the vertex without intravenous contrast. COMPARISON:  07/24/2017 head CT. FINDINGS: Brain: No evidence of parenchymal hemorrhage or extra-axial fluid collection. No mass lesion, mass effect, or midline shift. No CT evidence of acute infarction. Nonspecific moderate subcortical and periventricular white matter hypodensity, most in keeping with chronic small vessel ischemic change. Generalized cerebral volume loss. Cerebral ventricle sizes are stable and concordant with the degree of cerebral volume loss. Vascular: No acute abnormality. Skull: No evidence of calvarial fracture. Sinuses/Orbits: The visualized paranasal sinuses are essentially clear. Other:  The mastoid air cells are unopacified. IMPRESSION: 1.  No  evidence of acute intracranial abnormality. 2. Generalized cerebral volume loss and moderate chronic small vessel ischemic changes in the cerebral white matter. Electronically Signed   By: Ilona Sorrel M.D.   On: 12/05/2017 11:33   Ct Abdomen Pelvis W Contrast  Result Date: 12/06/2017 CLINICAL DATA:  Acute onset of anemia. Assess for hematoma or other source of blood loss. EXAM: CT ABDOMEN AND PELVIS WITH CONTRAST TECHNIQUE: Multidetector CT imaging of the abdomen and pelvis was performed using the standard protocol following bolus administration of intravenous contrast. CONTRAST:  65mL ISOVUE-300 IOPAMIDOL (ISOVUE-300) INJECTION 61% COMPARISON:  Lumbar spine radiographs from 04/16/2016 FINDINGS: Lower chest: Trace bilateral pleural fluid is noted, with bibasilar scarring and fibrotic change. Diffuse coronary artery calcifications are seen. Hepatobiliary: The liver is unremarkable in appearance. The gallbladder is unremarkable in appearance. The common bile duct remains normal in caliber. Pancreas: The pancreas is within normal limits. Spleen: The spleen is unremarkable in appearance. Adrenals/Urinary Tract: The adrenal glands are unremarkable in appearance. Mild bilateral renal pelvicaliectasis is noted. There is no evidence of significant hydronephrosis. No renal or ureteral stones are identified. No significant perinephric stranding is seen. Stomach/Bowel: The stomach is unremarkable in appearance. The small bowel is within normal limits. The appendix is normal in caliber, without evidence of appendicitis. Diffuse diverticulosis is noted along the entirety of the colon, without evidence of acute diverticulitis. Vascular/Lymphatic: Diffuse calcification is seen along the abdominal aorta and its branches. The abdominal aorta is otherwise grossly unremarkable. The inferior vena cava is grossly unremarkable. No retroperitoneal lymphadenopathy is seen. No pelvic sidewall lymphadenopathy is identified.  Reproductive: The bladder is mildly distended and grossly unremarkable. The patient is status post hysterectomy. No suspicious adnexal masses are seen. Other: No additional soft tissue abnormalities are seen. Musculoskeletal: No acute osseous abnormalities are identified. Chronic compression deformity is noted at L2. Multilevel vacuum phenomenon is noted along the lumbar spine. The right femoral screw appears grossly intact, though incompletely imaged. Vacuum phenomenon is noted at the sacroiliac joints. The visualized musculature is unremarkable in appearance. IMPRESSION: 1. No acute abnormality seen to explain the patient's symptoms. 2. Trace bilateral pleural fluid, with bibasilar scarring and fibrotic change. 3. Diffuse coronary artery calcifications. 4. Diffuse diverticulosis along the entirety of the colon, without evidence of diverticulitis. 5. Chronic compression deformity at L2. Aortic Atherosclerosis (ICD10-I70.0). Electronically Signed   By: Garald Balding M.D.   On: 12/06/2017 02:47       Subjective: No BM today.  She feels well, wants to go home.  Last stool 3-15 brown mixt black. Hb at 9. On oral iron.  Denies headaches or any pain  from fall.   Discharge Exam: Vitals:   12/06/17 1443 12/07/17 0550  BP: 117/62 136/72  Pulse: 91 85  Resp: 16 (!) 22  Temp: 97.9 F (36.6 C) 97.9 F (36.6 C)  SpO2: 95% 98%   Vitals:   12/05/17 2049 12/06/17 0531 12/06/17 1443 12/07/17 0550  BP: (!) 145/71 133/65 117/62 136/72  Pulse: (!) 118 84 91 85  Resp: 16 12 16  (!) 22  Temp: (!) 97.4 F (36.3 C) 97.6 F (36.4 C) 97.9 F (36.6 C) 97.9 F (36.6 C)  TempSrc: Oral Oral Oral Oral  SpO2: 100% 96% 95% 98%  Weight:      Height:        General: Pt is alert, awake, not in acute distress Cardiovascular: RRR, S1/S2 +, no rubs, no gallops Respiratory: CTA bilaterally, no wheezing, no rhonchi Abdominal: Soft, NT, ND, bowel sounds + Extremities: no edema, no cyanosis    The results of  significant diagnostics from this hospitalization (including imaging, microbiology, ancillary and laboratory) are listed below for reference.     Microbiology: No results found for this or any previous visit (from the past 240 hour(s)).   Labs: BNP (last 3 results) No results for input(s): BNP in the last 8760 hours. Basic Metabolic Panel: Recent Labs  Lab 12/05/17 1108 12/05/17 1115 12/06/17 0507  NA 138 140 138  K 3.7 3.7 3.4*  CL 103 102 105  CO2 24  --  22  GLUCOSE 153* 151* 128*  BUN 12 12 7   CREATININE 0.52 0.50 0.53  CALCIUM 8.3*  --  7.9*  MG 1.3*  --   --    Liver Function Tests: Recent Labs  Lab 12/05/17 1108 12/06/17 0507  AST 15 15  ALT 12* 11*  ALKPHOS 79 71  BILITOT 0.7 0.8  PROT 5.3* 5.4*  ALBUMIN 3.0* 2.6*   No results for input(s): LIPASE, AMYLASE in the last 168 hours. Recent Labs  Lab 12/05/17 1108  AMMONIA 10   CBC: Recent Labs  Lab 11/30/17 1226 12/05/17 1108 12/05/17 1115 12/06/17 0507 12/07/17 0622  WBC  --  8.8  --  6.2 7.7  NEUTROABS  --  6.8  --   --   --   HGB 7.5* 7.2* 8.2* 8.9* 9.5*  HCT 25.4* 24.3* 24.0* 28.7* 31.3*  MCV  --  77.4*  --  78.4 78.8  PLT  --  375  --  317 358   Cardiac Enzymes: No results for input(s): CKTOTAL, CKMB, CKMBINDEX, TROPONINI in the last 168 hours. BNP: Invalid input(s): POCBNP CBG: Recent Labs  Lab 12/06/17 1626 12/06/17 2041 12/07/17 0017 12/07/17 0359 12/07/17 0813  GLUCAP 258* 190* 86 108* 124*   D-Dimer No results for input(s): DDIMER in the last 72 hours. Hgb A1c No results for input(s): HGBA1C in the last 72 hours. Lipid Profile No results for input(s): CHOL, HDL, LDLCALC, TRIG, CHOLHDL, LDLDIRECT in the last 72 hours. Thyroid function studies Recent Labs    12/05/17 1108  TSH 0.251*   Anemia work up No results for input(s): VITAMINB12, FOLATE, FERRITIN, TIBC, IRON, RETICCTPCT in the last 72 hours. Urinalysis    Component Value Date/Time   COLORURINE YELLOW  07/24/2017 1258   APPEARANCEUR HAZY (A) 07/24/2017 1258   LABSPEC 1.016 07/24/2017 1258   PHURINE 5.0 07/24/2017 1258   GLUCOSEU NEGATIVE 07/24/2017 1258   HGBUR MODERATE (A) 07/24/2017 1258   HGBUR negative 03/31/2009 1047   BILIRUBINUR NEGATIVE 07/24/2017 1258   KETONESUR 20 (A)  07/24/2017 [place] 07/24/2017 1258   UROBILINOGEN 0.2 08/28/2012 0543   NITRITE NEGATIVE 07/24/2017 1258   LEUKOCYTESUR TRACE (A) 07/24/2017 1258   Sepsis Labs Invalid input(s): PROCALCITONIN,  WBC,  LACTICIDVEN Microbiology No results found for this or any previous visit (from the past 240 hour(s)).   Time coordinating discharge: Over 30 minutes  SIGNED:   Elmarie Shiley, MD  [facility] 12/07/2017, 11:09 AM Pager [phone]  If 7PM-7AM, please contact night-coverage [email or web address] Password [email or web address]

## 2017-12-09 ENCOUNTER — Ambulatory Visit (INDEPENDENT_AMBULATORY_CARE_PROVIDER_SITE_OTHER): Payer: Medicare Other | Admitting: Podiatry

## 2017-12-09 ENCOUNTER — Ambulatory Visit (INDEPENDENT_AMBULATORY_CARE_PROVIDER_SITE_OTHER): Payer: Medicare Other

## 2017-12-09 DIAGNOSIS — E1140 Type 2 diabetes mellitus with diabetic neuropathy, unspecified: Secondary | ICD-10-CM | POA: Diagnosis not present

## 2017-12-09 DIAGNOSIS — M79676 Pain in unspecified toe(s): Secondary | ICD-10-CM

## 2017-12-09 DIAGNOSIS — E08621 Diabetes mellitus due to underlying condition with foot ulcer: Secondary | ICD-10-CM

## 2017-12-09 DIAGNOSIS — M86072 Acute hematogenous osteomyelitis, left ankle and foot: Secondary | ICD-10-CM

## 2017-12-09 DIAGNOSIS — B351 Tinea unguium: Secondary | ICD-10-CM | POA: Diagnosis not present

## 2017-12-09 DIAGNOSIS — L97521 Non-pressure chronic ulcer of other part of left foot limited to breakdown of skin: Secondary | ICD-10-CM

## 2017-12-09 LAB — BPAM RBC
BLOOD PRODUCT EXPIRATION DATE: 201904082359
Blood Product Expiration Date: 201904082359
ISSUE DATE / TIME: 201903141402
UNIT TYPE AND RH: 5100
Unit Type and Rh: 5100

## 2017-12-09 LAB — TYPE AND SCREEN
ABO/RH(D): O POS
ANTIBODY SCREEN: NEGATIVE
UNIT DIVISION: 0
Unit division: 0

## 2017-12-09 LAB — T3, FREE: T3, Free: 1.8 pg/mL — ABNORMAL LOW (ref 2.0–4.4)

## 2017-12-09 MED ORDER — DOXYCYCLINE HYCLATE 100 MG PO TABS: 100.0000 mg | ORAL_TABLET | Freq: Two times a day (BID) | ORAL | 0 refills | Status: DC

## 2017-12-10 ENCOUNTER — Telehealth: Payer: Self-pay | Admitting: *Deleted

## 2017-12-10 ENCOUNTER — Telehealth: Payer: Self-pay | Admitting: Podiatry

## 2017-12-10 DIAGNOSIS — M86072 Acute hematogenous osteomyelitis, left ankle and foot: Secondary | ICD-10-CM

## 2017-12-10 DIAGNOSIS — E08621 Diabetes mellitus due to underlying condition with foot ulcer: Secondary | ICD-10-CM

## 2017-12-10 DIAGNOSIS — E1140 Type 2 diabetes mellitus with diabetic neuropathy, unspecified: Secondary | ICD-10-CM

## 2017-12-10 DIAGNOSIS — L97521 Non-pressure chronic ulcer of other part of left foot limited to breakdown of skin: Principal | ICD-10-CM

## 2017-12-10 NOTE — Telephone Encounter (Signed)
Faxed required form, clinical and demographics to [facility].

## 2017-12-10 NOTE — Telephone Encounter (Addendum)
Oneita Hurt states got PT orders and orders for GI bleed management from Dr. Shelia Media, can include nursing if wound care orders faxed to [phone].

## 2017-12-10 NOTE — Progress Notes (Signed)
Subjective: 84 year old female presents the office today for follow-up evaluation of a pre-ulcerative area on the left foot.  She states that she was scheduled to come in to her follow-up appointment but she passed out she ended up in the hospital she was just recently discharged last week.  She feels that the area is deeper and there is a wound on the foot now.  She states that when she was in the hospital it was looked at.  She is not on any antibiotics for this.  She denies seeing any drainage or any increase in swelling or redness to her feet.  She also states her nails are thick and elongated showed to have a tremor she is here today.  Denies any redness or drainage coming from the toenail sites.  She has no other concerns. Denies any systemic complaints such as fevers, chills, nausea, vomiting. No acute changes since last appointment, and no other complaints at this time.   Objective: AAO x3, NAD- very frail looking  DP/PT pulses palpable bilaterally, CRT less than 3 seconds Multiple chronic digital deformities are present or significant HAV present.  On the medial aspect of the first metatarsal head of the left foot is an ulceration measuring 1 x 1 cm which does probe directly down to the metatarsal head.  There is faint surrounding erythema but there is no ascending cellulitis.  There is no fluctuation or crepitation.  There is no malodor. There is starting of a pre-ulcerative area on the medial first metatarsal head on the right foot as well.  There is no ulceration identified today. The nails appear to be hypertrophic, dystrophic, discolored and there is tenderness nails 1-5 bilaterally except for left second toe.  No open lesions or pre-ulcerative lesions.  No pain with calf compression, swelling, warmth, erythema  Assessment: Ulceration with likely osteomyelitis left first metatarsal; symptomatic onychomycosis  Plan: -All treatment options discussed with the patient including all  alternatives, risks, complications.  -X-rays were obtained and reviewed.  No definitive evidence of cortical destruction suggestive of osteomyelitis but there is significant arthritic changes present. -In regards to the ulceration on the first metatarsal head this is been very difficult wound to heal given the deformity to her foot.  We will start doxycycline.  Surgical shoe dispensed.  Wound care referral was placed.  Discussed surgical intervention but I do not think she is a good candidate for surgery at this time.  Monitor for any clinical signs or symptoms of infection and directed to call the office immediately should any occur or go to the ER. -Nails sharp debrided x9 without any complications or bleeding -RTC 1 week or sooner if needed.   Trula Slade DPM

## 2017-12-10 NOTE — Telephone Encounter (Signed)
Faxed Dr. Leigh Aurora 12/10/2017 2:40pm wound care orders to [facility].

## 2017-12-10 NOTE — Telephone Encounter (Signed)
Hello, this is Lorriane Shire calling from [facility]. I'm calling about [name], a mutual pt of ours. I was calling to let you know I saw a note for home health referral and she is already on active services with [place] with physical therapy. If you can give me a call back at [phone]. Thank you.

## 2017-12-10 NOTE — Telephone Encounter (Signed)
For now I would do betadine wet to dry but I would like to get her into the wound care center.

## 2017-12-10 NOTE — Telephone Encounter (Signed)
-----   Message from Trula Slade, DPM sent at 12/10/2017  7:16 AM EDT ----- Can you please put in for a wound care referral for a new ulcer to the left 1st MTPJ which probes to bone. Thanks.

## 2017-12-11 ENCOUNTER — Encounter: Payer: Self-pay | Admitting: Hematology and Oncology

## 2017-12-11 ENCOUNTER — Telehealth: Payer: Self-pay | Admitting: Hematology and Oncology

## 2017-12-11 NOTE — Telephone Encounter (Signed)
A hematology appt has been scheduled with the pt's caregiver for the pt to see Dr. Lebron Conners on 3/26 at [place] that the pt should arrive 30 minutes early. Appt letter faxed to Fraser Din at [facility]

## 2017-12-12 DIAGNOSIS — E119 Type 2 diabetes mellitus without complications: Secondary | ICD-10-CM | POA: Diagnosis not present

## 2017-12-12 DIAGNOSIS — M160 Bilateral primary osteoarthritis of hip: Secondary | ICD-10-CM | POA: Diagnosis not present

## 2017-12-12 DIAGNOSIS — M069 Rheumatoid arthritis, unspecified: Secondary | ICD-10-CM | POA: Diagnosis not present

## 2017-12-12 DIAGNOSIS — M6281 Muscle weakness (generalized): Secondary | ICD-10-CM | POA: Diagnosis not present

## 2017-12-12 DIAGNOSIS — I10 Essential (primary) hypertension: Secondary | ICD-10-CM | POA: Diagnosis not present

## 2017-12-12 DIAGNOSIS — K5790 Diverticulosis of intestine, part unspecified, without perforation or abscess without bleeding: Secondary | ICD-10-CM | POA: Diagnosis not present

## 2017-12-16 DIAGNOSIS — K5790 Diverticulosis of intestine, part unspecified, without perforation or abscess without bleeding: Secondary | ICD-10-CM | POA: Diagnosis not present

## 2017-12-16 DIAGNOSIS — M069 Rheumatoid arthritis, unspecified: Secondary | ICD-10-CM | POA: Diagnosis not present

## 2017-12-16 DIAGNOSIS — I10 Essential (primary) hypertension: Secondary | ICD-10-CM | POA: Diagnosis not present

## 2017-12-16 DIAGNOSIS — M160 Bilateral primary osteoarthritis of hip: Secondary | ICD-10-CM | POA: Diagnosis not present

## 2017-12-16 DIAGNOSIS — E119 Type 2 diabetes mellitus without complications: Secondary | ICD-10-CM | POA: Diagnosis not present

## 2017-12-16 DIAGNOSIS — M6281 Muscle weakness (generalized): Secondary | ICD-10-CM | POA: Diagnosis not present

## 2017-12-17 ENCOUNTER — Encounter: Payer: Self-pay | Admitting: Hematology and Oncology

## 2017-12-17 ENCOUNTER — Encounter: Payer: Self-pay | Admitting: Podiatry

## 2017-12-17 ENCOUNTER — Inpatient Hospital Stay: Payer: Medicare Other | Attending: Hematology and Oncology | Admitting: Hematology and Oncology

## 2017-12-17 ENCOUNTER — Telehealth: Payer: Self-pay

## 2017-12-17 ENCOUNTER — Inpatient Hospital Stay: Payer: Medicare Other

## 2017-12-17 ENCOUNTER — Ambulatory Visit (INDEPENDENT_AMBULATORY_CARE_PROVIDER_SITE_OTHER): Payer: Medicare Other | Admitting: Podiatry

## 2017-12-17 VITALS — BP 129/72 | HR 89 | Temp 97.8°F | Resp 18 | Ht 65.0 in | Wt 91.4 lb

## 2017-12-17 DIAGNOSIS — E08621 Diabetes mellitus due to underlying condition with foot ulcer: Secondary | ICD-10-CM | POA: Diagnosis not present

## 2017-12-17 DIAGNOSIS — M86072 Acute hematogenous osteomyelitis, left ankle and foot: Secondary | ICD-10-CM | POA: Diagnosis not present

## 2017-12-17 DIAGNOSIS — Z79899 Other long term (current) drug therapy: Secondary | ICD-10-CM | POA: Diagnosis not present

## 2017-12-17 DIAGNOSIS — I10 Essential (primary) hypertension: Secondary | ICD-10-CM

## 2017-12-17 DIAGNOSIS — E119 Type 2 diabetes mellitus without complications: Secondary | ICD-10-CM | POA: Insufficient documentation

## 2017-12-17 DIAGNOSIS — R5383 Other fatigue: Secondary | ICD-10-CM | POA: Diagnosis not present

## 2017-12-17 DIAGNOSIS — M1990 Unspecified osteoarthritis, unspecified site: Secondary | ICD-10-CM | POA: Diagnosis not present

## 2017-12-17 DIAGNOSIS — E039 Hypothyroidism, unspecified: Secondary | ICD-10-CM

## 2017-12-17 DIAGNOSIS — D500 Iron deficiency anemia secondary to blood loss (chronic): Secondary | ICD-10-CM | POA: Diagnosis not present

## 2017-12-17 DIAGNOSIS — M069 Rheumatoid arthritis, unspecified: Secondary | ICD-10-CM | POA: Diagnosis not present

## 2017-12-17 DIAGNOSIS — L97521 Non-pressure chronic ulcer of other part of left foot limited to breakdown of skin: Secondary | ICD-10-CM

## 2017-12-17 DIAGNOSIS — Z7984 Long term (current) use of oral hypoglycemic drugs: Secondary | ICD-10-CM | POA: Diagnosis not present

## 2017-12-17 DIAGNOSIS — E785 Hyperlipidemia, unspecified: Secondary | ICD-10-CM

## 2017-12-17 DIAGNOSIS — Z7952 Long term (current) use of systemic steroids: Secondary | ICD-10-CM | POA: Insufficient documentation

## 2017-12-17 DIAGNOSIS — Z800 Family history of malignant neoplasm of digestive organs: Secondary | ICD-10-CM

## 2017-12-17 DIAGNOSIS — K219 Gastro-esophageal reflux disease without esophagitis: Secondary | ICD-10-CM | POA: Insufficient documentation

## 2017-12-17 DIAGNOSIS — Z8719 Personal history of other diseases of the digestive system: Secondary | ICD-10-CM | POA: Diagnosis not present

## 2017-12-17 LAB — CMP (CANCER CENTER ONLY)
ALBUMIN: 2.8 g/dL — AB (ref 3.5–5.0)
ALK PHOS: 75 U/L (ref 40–150)
ALT: 8 U/L (ref 0–55)
ANION GAP: 8 (ref 3–11)
AST: 12 U/L (ref 5–34)
BILIRUBIN TOTAL: 0.3 mg/dL (ref 0.2–1.2)
BUN: 14 mg/dL (ref 7–26)
CALCIUM: 8.8 mg/dL (ref 8.4–10.4)
CO2: 27 mmol/L (ref 22–29)
Chloride: 104 mmol/L (ref 98–109)
Creatinine: 0.68 mg/dL (ref 0.60–1.10)
GFR, Est AFR Am: >60 mL/min (ref >60)
GFR, Estimated: >60 mL/min (ref >60)
GLUCOSE: 240 mg/dL — AB (ref 70–140)
Potassium: 3.8 mmol/L (ref 3.5–5.1)
Sodium: 139 mmol/L (ref 136–145)
TOTAL PROTEIN: 6.3 g/dL — AB (ref 6.4–8.3)

## 2017-12-17 LAB — IRON AND TIBC
Iron: 19 ug/dL — ABNORMAL LOW (ref 41–142)
Saturation Ratios: 7 % — ABNORMAL LOW (ref 21–57)
TIBC: 276 ug/dL (ref 236–444)
UIBC: 258 ug/dL

## 2017-12-17 LAB — CBC WITH DIFFERENTIAL (CANCER CENTER ONLY)
Basophils Absolute: 0 10*3/uL (ref 0.0–0.1)
Basophils Relative: 1 %
Eosinophils Absolute: 0.2 10*3/uL (ref 0.0–0.5)
Eosinophils Relative: 2 %
HCT: 29.7 % — ABNORMAL LOW (ref 34.8–46.6)
Hemoglobin: 9.4 g/dL — ABNORMAL LOW (ref 11.6–15.9)
Lymphocytes Relative: 11 %
Lymphs Abs: 0.9 10*3/uL (ref 0.9–3.3)
MCH: 24.1 pg — ABNORMAL LOW (ref 25.1–34.0)
MCHC: 31.8 g/dL (ref 31.5–36.0)
MCV: 75.8 fL — ABNORMAL LOW (ref 79.5–101.0)
Monocytes Absolute: 0.7 10*3/uL (ref 0.1–0.9)
Monocytes Relative: 8 %
Neutro Abs: 6.2 10*3/uL (ref 1.5–6.5)
Neutrophils Relative %: 78 %
Platelet Count: 432 10*3/uL — ABNORMAL HIGH (ref 145–400)
RBC: 3.91 MIL/uL (ref 3.70–5.45)
RDW: 19 % — ABNORMAL HIGH (ref 11.2–14.5)
WBC Count: 7.9 10*3/uL (ref 3.9–10.3)

## 2017-12-17 LAB — FERRITIN: Ferritin: 38 ng/mL (ref 9–269)

## 2017-12-17 NOTE — Progress Notes (Signed)
[facility] New Visit:  Assessment: Iron deficiency anemia due to chronic blood loss 84 y.o. female with extensive past medical history including severe rheumatoid arthritis with bilateral thumb deformity.  Patient has history of recurrent gastrointestinal bleeding including at least 2 admissions over the past month.  Referred for severe anemia requiring transfusions following episodes of hematochezia.  Clear combination of blood loss anemia and anemia of iron deficiency.  My preference would be for the patient undergo repeat gastrointestinal evaluation by EGD and colonoscopy to assess the exact etiology of the bleeding to appropriately plan the care for the patient.  As for supportive care, recommend combination of blood transfusions and iron infusions which we will conduct in our cancer center.  Plan: -Labs today as outlined below. --Consult GI for EGD/Colonoscopy -Return to clinic in 1 week: Repeat CBC, clinic visit, IV iron infusion --we will use her elastic instead of Feraheme   Voice recognition software was used and creation of this note. Despite my best effort at editing the text, some misspelling/errors may have occurred. Orders Placed This Encounter  Procedures  . CBC with Differential (Cancer Center Only)    Standing Status:   Future    Number of Occurrences:   1    Standing Expiration Date:   12/18/2018  . CMP ([facility] only)    Standing Status:   Future    Number of Occurrences:   1    Standing Expiration Date:   12/18/2018  . Iron and TIBC    Standing Status:   Future    Number of Occurrences:   1    Standing Expiration Date:   12/18/2018  . Ferritin    Standing Status:   Future    Number of Occurrences:   1    Standing Expiration Date:   12/18/2018  . Copper, serum    Standing Status:   Future    Number of Occurrences:   1    Standing Expiration Date:   12/17/2018  . Ceruloplasmin    Standing Status:   Future    Number of Occurrences:   1     Standing Expiration Date:   12/17/2018  . Ambulatory referral to Gastroenterology    Referral Priority:   Routine    Referral Type:   Consultation    Referral Reason:   Specialty Services Required    Number of Visits Requested:   1    All questions were answered.  . The patient knows to call the clinic with any problems, questions or concerns.  This note was electronically signed.    History of Presenting Illness [name] 84 y.o. presenting to the [facility] for severe recurrent anemia due to lower gastrointestinal bleeding, referred by patient's past medical history significant for hyperlipidemia, anxiety/depression,Dr Deland Pretty.  Spinal stenosis, type 2 diabetes mellitus, rheumatoid arthritis on chronic prednisone and methotrexate, hypertension, hypothyroidism, and history of diverticulosis with recurrent gastrointestinal bleeding previously requiring transfusions.  Patient is currently receiving iron supplement with liquid taking 2 teaspoons/day.  I do not see the medication listed on her list.  Patient is not taking ferrous sulfate which is listed.  At the present time, patient denies any fever, chills, night sweats.  Denies abdominal pain.  Stable musculoskeletal complaints.  Patient denies episodes of melena or hematochezia.  Oncological/hematological History: --Labs, 10/14/17: WBC 6.9, Hgb 10.6, MCV 86.4, MCH 26.2, MCHC 30.4, RDW 15.5, Plt 378; Fe 23, FeSat 7%, TIBC 319, Ferritin ... --Admission, 03/08-09/19:  Admitted for lower GI bleeding likely from a diverticular source with self-contained hematochezia.  Evaluated by gastroenterology who recommended no intervention at the time. --Labs, 12/04/17: WBC 8.4, Hgb   7.2, MCV 78.9, MCH 23.4, MCHC 29.6, RDW 18.0, Plt 406; FOBT -- positive --Admission, 03/14-16/19: Admitted for slurred speech, profound weakness.  Medical record describes precedent episode of hematochezia. --Treatment:  --Transfusion, 12/05/17: pRBC  x2Units --Labs, 12/07/17: WBC 7.7, Hgb   9.5, MCV 78.8, MCH 23.9, MCHC 30.4, RDW 17.9, Plt 358;   Medical History: Past Medical History:  Diagnosis Date  . Acute bronchitis   . Anemia, unspecified   . Atrial fibrillation ([place])   . Depressive disorder, not elsewhere classified   . Diverticulosis of colon (without mention of hemorrhage)   . Esophageal reflux   . Family history of malignant neoplasm of gastrointestinal tract   . Functional diarrhea   . Hiatal hernia   . History of blood transfusion    "once; related to diverticulitis" (11/29/2017)  . Other and unspecified hyperlipidemia   . Other chest pain   . Other specified cardiac dysrhythmias(427.89)   . Personal history of colonic polyps 07/17/1995   adenomatous polyps  . PONV (postoperative nausea and vomiting)   . Recurrent UTI (urinary tract infection)    "2-3 times in the last 1 1/20yr (11/29/2017)  . Rheumatoid arthritis ([place]   . Tachycardia, unspecified   . Type II diabetes mellitus ([place]   . Unspecified adverse effect of unspecified drug, medicinal and biological substance   . Unspecified essential hypertension   . Unspecified hypothyroidism     Surgical History: Past Surgical History:  Procedure Laterality Date  . ABDOMINAL HYSTERECTOMY     "partial"  . CATARACT EXTRACTION Bilateral   . FOREARM FRACTURE SURGERY Right 1940s?  . FRACTURE SURGERY    . ORIF FEMUR FRACTURE  08/30/2012   Procedure: OPEN REDUCTION INTERNAL FIXATION (ORIF) DISTAL FEMUR FRACTURE;  Surgeon: MMauri Pole MD;  Location: [facility];  Service: Orthopedics;  Laterality: Right;  proximal femur  . THYROIDECTOMY, PARTIAL      Family History: Family History  Problem Relation Age of Onset  . Colon cancer Maternal Grandmother     Social History: Social History   Socioeconomic History  . Marital status: Widowed    Spouse name: Not on file  . Number of children: 3  . Years of education: Not on file  . Highest education level: Not on file   Occupational History    Employer: RETIRED  Social Needs  . Financial resource strain: Not on file  . Food insecurity:    Worry: Not on file    Inability: Not on file  . Transportation needs:    Medical: Not on file    Non-medical: Not on file  Tobacco Use  . Smoking status: Never Smoker  . Smokeless tobacco: Never Used  Substance and Sexual Activity  . Alcohol use: Yes    Alcohol/week: 0.6 oz    Types: 1 Glasses of wine per week  . Drug use: No  . Sexual activity: Never  Lifestyle  . Physical activity:    Days per week: Not on file    Minutes per session: Not on file  . Stress: Not on file  Relationships  . Social connections:    Talks on phone: Not on file    Gets together: Not on file    Attends religious service: Not on file    Active member of club or organization: Not on  file    Attends meetings of clubs or organizations: Not on file    Relationship status: Not on file  . Intimate partner violence:    Fear of current or ex partner: Not on file    Emotionally abused: Not on file    Physically abused: Not on file    Forced sexual activity: Not on file  Other Topics Concern  . Not on file  Social History Narrative  . Not on file    Allergies: No Known Allergies  Medications:  Current Outpatient Medications  Medication Sig Dispense Refill  . doxycycline (VIBRA-TABS) 100 MG tablet Take 1 tablet (100 mg total) by mouth 2 (two) times daily. 20 tablet 0  . feeding supplement, ENSURE ENLIVE, (ENSURE ENLIVE) LIQD Take 237 mLs by mouth 2 (two) times daily between meals. 237 mL 12  . ferrous sulfate 325 (65 FE) MG EC tablet Take 1 tablet (325 mg total) by mouth 2 (two) times daily. 60 tablet 3  . folic acid (FOLVITE) 1 MG tablet Take 1 mg by mouth at bedtime.     Marland Kitchen leflunomide (ARAVA) 20 MG tablet Take 20 mg by mouth daily.    Marland Kitchen levothyroxine (SYNTHROID) 100 MCG tablet Take 1 tablet (100 mcg total) by mouth daily. 30 tablet 11  . magnesium oxide (MAG-OX) 400 (241.3  Mg) MG tablet Take 0.5 tablets (200 mg total) by mouth 2 (two) times daily. 10 tablet 0  . Melatonin 2.5 MG CAPS Take 1 capsule (2.5 mg total) by mouth at bedtime as needed (for sleep). 30 capsule 0  . metFORMIN (GLUCOPHAGE-XR) 500 MG 24 hr tablet Take 500-1,000 mg by mouth See admin instructions. Take 2 tablets in the morning, and 1 tablet in the evening    . omeprazole (PRILOSEC) 40 MG capsule Take 1 capsule (40 mg total) by mouth 2 (two) times daily. 60 capsule 0  . Pitavastatin Calcium (LIVALO) 2 MG TABS Take 2 mg by mouth every Monday, Wednesday, and Friday at 6 PM.     . predniSONE (DELTASONE) 1 MG tablet Take 4 mg by mouth daily with breakfast.     . VOLTAREN 1 % GEL Apply 2 g topically 4 (four) times daily.      No current facility-administered medications for this visit.     Review of Systems: Review of Systems  Constitutional: Positive for fatigue.  Musculoskeletal: Positive for arthralgias.  All other systems reviewed and are negative.    PHYSICAL EXAMINATION Blood pressure 129/72, pulse 89, temperature 97.8 F (36.6 C), temperature source Axillary, resp. rate 18, height _0  (1.651 m), weight 91 lb 6.4 oz (41.5 kg), SpO2 98 %.  ECOG PERFORMANCE STATUS: 2 - Symptomatic, <50% confined to bed  Physical Exam  Constitutional: She is oriented to person, place, and time. No distress.  Frail cacechtic elderly female  HENT:  Head: Normocephalic and atraumatic.  Mouth/Throat: Oropharynx is clear and moist. No oropharyngeal exudate.  Eyes: Pupils are equal, round, and reactive to light. Conjunctivae and EOM are normal. No scleral icterus.  Conjunctival palor  Neck: No thyromegaly present.  Cardiovascular: Normal rate, regular rhythm and normal heart sounds.  No murmur heard. Pulmonary/Chest: Effort normal. No respiratory distress. She has no wheezes. She has no rales.  Abdominal: Soft. Bowel sounds are normal. She exhibits no distension and no mass. There is no tenderness.  There is no guarding.  Musculoskeletal: She exhibits deformity. She exhibits no edema.  Extensive arthritic changes with most notably ulnar deviation of bilateral  metacarpophalangeal joints.  Lymphadenopathy:    She has no cervical adenopathy.  Neurological: She is alert and oriented to person, place, and time. She has normal reflexes. No cranial nerve deficit.  Skin: No rash noted. She is not diaphoretic. No erythema. There is pallor.     LABORATORY DATA: I have personally reviewed the data as listed: Appointment on 12/17/2017  Component Date Value Ref Range Status  . Ferritin 12/17/2017 38  9 - 269 ng/mL Final   Performed at [facility], [place] [address] [place], [place] [address]  . Iron 12/17/2017 19* 41 - 142 ug/dL Final  . TIBC 12/17/2017 276  236 - 444 ug/dL Final  . Saturation Ratios 12/17/2017 7* 21 - 57 % Final  . UIBC 12/17/2017 258  ug/dL Final   Performed at [facility], [place] [address], [place], [place] [address]  . Sodium 12/17/2017 139  136 - 145 mmol/L Final  . Potassium 12/17/2017 3.8  3.5 - 5.1 mmol/L Final  . Chloride 12/17/2017 104  98 - 109 mmol/L Final  . CO2 12/17/2017 27  22 - 29 mmol/L Final  . Glucose, Bld 12/17/2017 240* 70 - 140 mg/dL Final  . BUN 12/17/2017 14  7 - 26 mg/dL Final  . Creatinine 12/17/2017 0.68  0.60 - 1.10 mg/dL Final  . Calcium 12/17/2017 8.8  8.4 - 10.4 mg/dL Final  . Total Protein 12/17/2017 6.3* 6.4 - 8.3 g/dL Final  . Albumin 12/17/2017 2.8* 3.5 - 5.0 g/dL Final  . AST 12/17/2017 12  5 - 34 U/L Final  . ALT 12/17/2017 8  0 - 55 U/L Final  . Alkaline Phosphatase 12/17/2017 75  40 - 150 U/L Final  . Total Bilirubin 12/17/2017 0.3  0.2 - 1.2 mg/dL Final  . GFR, Est Non Af Am 12/17/2017 >60  >60 mL/min Final  . GFR, Est AFR Am 12/17/2017 >60  >60 mL/min Final   Comment: (NOTE) The eGFR has been calculated using the CKD EPI equation. This calculation has not been validated in all  clinical situations. eGFR's persistently <60 mL/min signify possible Chronic Kidney Disease.   Georgiann Hahn gap 12/17/2017 8  3 - 11 Final   Performed at [facility], [place] [address], [place], [place] [address]  . WBC Count 12/17/2017 7.9  3.9 - 10.3 K/uL Final  . RBC 12/17/2017 3.91  3.70 - 5.45 MIL/uL Final  . Hemoglobin 12/17/2017 9.4* 11.6 - 15.9 g/dL Final  . HCT 12/17/2017 29.7* 34.8 - 46.6 % Final  . MCV 12/17/2017 75.8* 79.5 - 101.0 fL Final  . MCH 12/17/2017 24.1* 25.1 - 34.0 pg Final  . MCHC 12/17/2017 31.8  31.5 - 36.0 g/dL Final  . RDW 12/17/2017 19.0* 11.2 - 14.5 % Final  . Platelet Count 12/17/2017 432* 145 - 400 K/uL Final  . Neutrophils Relative % 12/17/2017 78  % Final  . Neutro Abs 12/17/2017 6.2  1.5 - 6.5 K/uL Final  . Lymphocytes Relative 12/17/2017 11  % Final  . Lymphs Abs 12/17/2017 0.9  0.9 - 3.3 K/uL Final  . Monocytes Relative 12/17/2017 8  % Final  . Monocytes Absolute 12/17/2017 0.7  0.1 - 0.9 K/uL Final  . Eosinophils Relative 12/17/2017 2  % Final  . Eosinophils Absolute 12/17/2017 0.2  0.0 - 0.5 K/uL Final  . Basophils Relative 12/17/2017 1  % Final  . Basophils Absolute 12/17/2017 0.0  0.0 - 0.1 K/uL Final   Performed at  [facility], [place] [address] [place], [place] [address]         Ardath Sax, MD

## 2017-12-17 NOTE — Assessment & Plan Note (Addendum)
84 y.o. female with extensive past medical history including severe rheumatoid arthritis with bilateral thumb deformity.  Patient has history of recurrent gastrointestinal bleeding including at least 2 admissions over the past month.  Referred for severe anemia requiring transfusions following episodes of hematochezia.  Clear combination of blood loss anemia and anemia of iron deficiency.  My preference would be for the patient undergo repeat gastrointestinal evaluation by EGD and colonoscopy to assess the exact etiology of the bleeding to appropriately plan the care for the patient.  As for supportive care, recommend combination of blood transfusions and iron infusions which we will conduct in our cancer center.  Plan: -Labs today as outlined below. --Consult GI for EGD/Colonoscopy -Return to clinic in 1 week: Repeat CBC, clinic visit, IV iron infusion --we will use her elastic instead of Feraheme

## 2017-12-17 NOTE — Telephone Encounter (Signed)
Printed avs and calender of upcoming appointment.m per 3/26 los. h lab add on today

## 2017-12-18 DIAGNOSIS — I10 Essential (primary) hypertension: Secondary | ICD-10-CM | POA: Diagnosis not present

## 2017-12-18 DIAGNOSIS — E119 Type 2 diabetes mellitus without complications: Secondary | ICD-10-CM | POA: Diagnosis not present

## 2017-12-18 DIAGNOSIS — M160 Bilateral primary osteoarthritis of hip: Secondary | ICD-10-CM | POA: Diagnosis not present

## 2017-12-18 DIAGNOSIS — M6281 Muscle weakness (generalized): Secondary | ICD-10-CM | POA: Diagnosis not present

## 2017-12-18 DIAGNOSIS — K5790 Diverticulosis of intestine, part unspecified, without perforation or abscess without bleeding: Secondary | ICD-10-CM | POA: Diagnosis not present

## 2017-12-18 DIAGNOSIS — M069 Rheumatoid arthritis, unspecified: Secondary | ICD-10-CM | POA: Diagnosis not present

## 2017-12-18 LAB — CERULOPLASMIN: Ceruloplasmin: 24 mg/dL (ref 19.0–39.0)

## 2017-12-19 DIAGNOSIS — M6281 Muscle weakness (generalized): Secondary | ICD-10-CM | POA: Diagnosis not present

## 2017-12-19 DIAGNOSIS — I10 Essential (primary) hypertension: Secondary | ICD-10-CM | POA: Diagnosis not present

## 2017-12-19 DIAGNOSIS — M069 Rheumatoid arthritis, unspecified: Secondary | ICD-10-CM | POA: Diagnosis not present

## 2017-12-19 DIAGNOSIS — E119 Type 2 diabetes mellitus without complications: Secondary | ICD-10-CM | POA: Diagnosis not present

## 2017-12-19 DIAGNOSIS — M160 Bilateral primary osteoarthritis of hip: Secondary | ICD-10-CM | POA: Diagnosis not present

## 2017-12-19 DIAGNOSIS — K5790 Diverticulosis of intestine, part unspecified, without perforation or abscess without bleeding: Secondary | ICD-10-CM | POA: Diagnosis not present

## 2017-12-19 LAB — COPPER, SERUM: COPPER: 110 ug/dL (ref 72–166)

## 2017-12-19 NOTE — Progress Notes (Signed)
Subjective: [name] presents the office with her caregiver for follow-up evaluation of ulceration, likely osteomyelitis of the left foot medial first metatarsal head.  He is she states that she has not received a call from the wound care center.  They have been putting a dry bandage on the area daily.  She has been taking doxycycline she believes.  She denies seeing any drainage or pus and she thinks that the wound may be somewhat better.  She states that she does not want to go to the wound care center. Denies any systemic complaints such as fevers, chills, nausea, vomiting. No acute changes since last appointment, and no other complaints at this time.   Objective: AAO x3, NAD DP/PT pulses palpable bilaterally, CRT less than 3 seconds Protective sensation decreased with Simms Weinstein monofilament Significant HAV is present as well as digital deformity.  Ulceration continues to the medial first metatarsal head which still able to probe down to the first metatarsal head.  There is faint surrounding erythema on the periphery but there is no ascending cellulitis.  The wound measures the same size of 1 x 1 cm but overall the wound appears to be unchanged.  There is no fluctuation or crepitation.  There is no malodor.  No drainage or pus to culture. No open lesions or pre-ulcerative lesions.  No pain with calf compression, swelling, warmth, erythema  Assessment: Ulceration with osteomyelitis left foot  Plan: -All treatment options discussed with the patient including all alternatives, risks, complications.  -Pulmonary to Iodosorb dressing changes daily.  We did give her some of this today.  Also continue with doxycycline off at all times.  Continue with surgical shoe which she presents in today.  I still recommend wound care evaluation.  Referral is pending.  She will think about going. -I do think should benefit from surgical intervention however given her comorbidities she may not be a good surgical  candidate.  We will continue to monitor the wound closely. -Follow-up in 1 week or sooner if needed. -Patient encouraged to call the office with any questions, concerns, change in symptoms.   *X-ray left foot next appointment  Trula Slade DPM

## 2017-12-20 ENCOUNTER — Telehealth: Payer: Self-pay | Admitting: Podiatry

## 2017-12-20 NOTE — Telephone Encounter (Signed)
I put some iodosorb in a syringe

## 2017-12-20 NOTE — Telephone Encounter (Signed)
Lattie Haw made another tube and I tried calling pt and no one answers.Pt will need to pick it up.

## 2017-12-20 NOTE — Telephone Encounter (Signed)
Spoke to pt and she is going to have her nurse Bria come pick it up.

## 2017-12-20 NOTE — Telephone Encounter (Signed)
Pt called and was seen this week and you gave her a medication in what looked like a needle (pt states medication looked like dirt) and she was waiting to put it on today but  cannot find it. What should she do.

## 2017-12-23 DIAGNOSIS — I10 Essential (primary) hypertension: Secondary | ICD-10-CM | POA: Diagnosis not present

## 2017-12-23 DIAGNOSIS — K5790 Diverticulosis of intestine, part unspecified, without perforation or abscess without bleeding: Secondary | ICD-10-CM | POA: Diagnosis not present

## 2017-12-23 DIAGNOSIS — M160 Bilateral primary osteoarthritis of hip: Secondary | ICD-10-CM | POA: Diagnosis not present

## 2017-12-23 DIAGNOSIS — M069 Rheumatoid arthritis, unspecified: Secondary | ICD-10-CM | POA: Diagnosis not present

## 2017-12-23 DIAGNOSIS — E119 Type 2 diabetes mellitus without complications: Secondary | ICD-10-CM | POA: Diagnosis not present

## 2017-12-23 DIAGNOSIS — M6281 Muscle weakness (generalized): Secondary | ICD-10-CM | POA: Diagnosis not present

## 2017-12-24 ENCOUNTER — Ambulatory Visit: Payer: Medicare Other

## 2017-12-24 ENCOUNTER — Inpatient Hospital Stay: Payer: Medicare Other

## 2017-12-24 ENCOUNTER — Inpatient Hospital Stay: Payer: Medicare Other | Attending: Hematology and Oncology | Admitting: Hematology and Oncology

## 2017-12-24 ENCOUNTER — Telehealth: Payer: Self-pay | Admitting: Hematology and Oncology

## 2017-12-24 VITALS — BP 148/72 | HR 94 | Temp 97.5°F | Resp 20 | Ht 65.0 in | Wt 95.5 lb

## 2017-12-24 DIAGNOSIS — M069 Rheumatoid arthritis, unspecified: Secondary | ICD-10-CM | POA: Diagnosis not present

## 2017-12-24 DIAGNOSIS — I10 Essential (primary) hypertension: Secondary | ICD-10-CM | POA: Diagnosis not present

## 2017-12-24 DIAGNOSIS — Z7984 Long term (current) use of oral hypoglycemic drugs: Secondary | ICD-10-CM | POA: Diagnosis not present

## 2017-12-24 DIAGNOSIS — Z7952 Long term (current) use of systemic steroids: Secondary | ICD-10-CM | POA: Diagnosis not present

## 2017-12-24 DIAGNOSIS — E039 Hypothyroidism, unspecified: Secondary | ICD-10-CM | POA: Diagnosis not present

## 2017-12-24 DIAGNOSIS — Z79899 Other long term (current) drug therapy: Secondary | ICD-10-CM | POA: Insufficient documentation

## 2017-12-24 DIAGNOSIS — M4800 Spinal stenosis, site unspecified: Secondary | ICD-10-CM | POA: Insufficient documentation

## 2017-12-24 DIAGNOSIS — E119 Type 2 diabetes mellitus without complications: Secondary | ICD-10-CM | POA: Insufficient documentation

## 2017-12-24 DIAGNOSIS — Z8719 Personal history of other diseases of the digestive system: Secondary | ICD-10-CM | POA: Diagnosis not present

## 2017-12-24 DIAGNOSIS — D500 Iron deficiency anemia secondary to blood loss (chronic): Secondary | ICD-10-CM | POA: Insufficient documentation

## 2017-12-24 DIAGNOSIS — E785 Hyperlipidemia, unspecified: Secondary | ICD-10-CM | POA: Insufficient documentation

## 2017-12-24 NOTE — Telephone Encounter (Signed)
Appointments scheduled AVS/Calendar printed per 4/2 los °

## 2017-12-25 DIAGNOSIS — M0579 Rheumatoid arthritis with rheumatoid factor of multiple sites without organ or systems involvement: Secondary | ICD-10-CM | POA: Diagnosis not present

## 2017-12-25 DIAGNOSIS — M069 Rheumatoid arthritis, unspecified: Secondary | ICD-10-CM | POA: Diagnosis not present

## 2017-12-25 DIAGNOSIS — D649 Anemia, unspecified: Secondary | ICD-10-CM | POA: Diagnosis not present

## 2017-12-25 DIAGNOSIS — K5790 Diverticulosis of intestine, part unspecified, without perforation or abscess without bleeding: Secondary | ICD-10-CM | POA: Diagnosis not present

## 2017-12-25 DIAGNOSIS — M79643 Pain in unspecified hand: Secondary | ICD-10-CM | POA: Diagnosis not present

## 2017-12-25 DIAGNOSIS — Z79899 Other long term (current) drug therapy: Secondary | ICD-10-CM | POA: Diagnosis not present

## 2017-12-25 DIAGNOSIS — M160 Bilateral primary osteoarthritis of hip: Secondary | ICD-10-CM | POA: Diagnosis not present

## 2017-12-25 DIAGNOSIS — M6281 Muscle weakness (generalized): Secondary | ICD-10-CM | POA: Diagnosis not present

## 2017-12-25 DIAGNOSIS — E119 Type 2 diabetes mellitus without complications: Secondary | ICD-10-CM | POA: Diagnosis not present

## 2017-12-25 DIAGNOSIS — I10 Essential (primary) hypertension: Secondary | ICD-10-CM | POA: Diagnosis not present

## 2017-12-25 DIAGNOSIS — M810 Age-related osteoporosis without current pathological fracture: Secondary | ICD-10-CM | POA: Diagnosis not present

## 2017-12-26 DIAGNOSIS — M160 Bilateral primary osteoarthritis of hip: Secondary | ICD-10-CM | POA: Diagnosis not present

## 2017-12-26 DIAGNOSIS — K5790 Diverticulosis of intestine, part unspecified, without perforation or abscess without bleeding: Secondary | ICD-10-CM | POA: Diagnosis not present

## 2017-12-26 DIAGNOSIS — I10 Essential (primary) hypertension: Secondary | ICD-10-CM | POA: Diagnosis not present

## 2017-12-26 DIAGNOSIS — E119 Type 2 diabetes mellitus without complications: Secondary | ICD-10-CM | POA: Diagnosis not present

## 2017-12-26 DIAGNOSIS — M6281 Muscle weakness (generalized): Secondary | ICD-10-CM | POA: Diagnosis not present

## 2017-12-26 DIAGNOSIS — M069 Rheumatoid arthritis, unspecified: Secondary | ICD-10-CM | POA: Diagnosis not present

## 2017-12-27 ENCOUNTER — Encounter: Payer: Self-pay | Admitting: Podiatry

## 2017-12-27 ENCOUNTER — Ambulatory Visit (INDEPENDENT_AMBULATORY_CARE_PROVIDER_SITE_OTHER): Payer: Medicare Other

## 2017-12-27 ENCOUNTER — Ambulatory Visit (INDEPENDENT_AMBULATORY_CARE_PROVIDER_SITE_OTHER): Payer: Medicare Other | Admitting: Podiatry

## 2017-12-27 DIAGNOSIS — L97522 Non-pressure chronic ulcer of other part of left foot with fat layer exposed: Secondary | ICD-10-CM

## 2017-12-27 DIAGNOSIS — M86072 Acute hematogenous osteomyelitis, left ankle and foot: Secondary | ICD-10-CM | POA: Diagnosis not present

## 2017-12-29 NOTE — Progress Notes (Signed)
Subjective: [name] presents the office with her caregiver for follow-up evaluation of ulceration, likely osteomyelitis of the left foot medial first metatarsal head.  She states that she is still on the antibiotics that had prescribed for her.  She also states that her nurses have been changing the bandage daily.  She is not sure how the wound is doing but she hopes that there is not a hole there today. Denies any systemic complaints such as fevers, chills, nausea, vomiting. No acute changes since last appointment, and no other complaints at this time.   Objective: AAO x3, NAD DP/PT pulses palpable bilaterally, CRT less than 3 seconds Protective sensation decreased with Simms Weinstein monofilament Significant HAV is present as well as digital deformity.  Ulceration continues to the medial aspect the first metatarsal head measuring approximately 1 x 1 cm.  Unable to probe to bone today and appears to be filling in.  There is faint surrounding erythema likely more from inflammation as opposed to infection and there is no ascending cellulitis.  There is no fluctuation or crepitation.  There is no malodor.  No other open lesions identified.    No pain with calf compression, swelling, warmth, erythema  Assessment: Ulceration left foot  Plan: -All treatment options discussed with the patient including all alternatives, risks, complications.  -X-rays were obtained and reviewed.  No definitive changes on along the first metatarsal today.  No soft tissue emphysema is present. -Wearing sweats dressing changes to using a collagen, silver.  I ordered this today through [employer] and the paperwork was completed today.  Until we get the new dressings were continued on his daily. -Continue surgical shoe and offloading all times. -Continue antibiotics until follow-up next appointment. -Follow-up in 1 week or sooner if needed. -Patient encouraged to call the office with any questions, concerns, change in  symptoms.   Trula Slade DPM

## 2017-12-30 DIAGNOSIS — M6281 Muscle weakness (generalized): Secondary | ICD-10-CM | POA: Diagnosis not present

## 2017-12-30 DIAGNOSIS — K5790 Diverticulosis of intestine, part unspecified, without perforation or abscess without bleeding: Secondary | ICD-10-CM | POA: Diagnosis not present

## 2017-12-30 DIAGNOSIS — M160 Bilateral primary osteoarthritis of hip: Secondary | ICD-10-CM | POA: Diagnosis not present

## 2017-12-30 DIAGNOSIS — E119 Type 2 diabetes mellitus without complications: Secondary | ICD-10-CM | POA: Diagnosis not present

## 2017-12-30 DIAGNOSIS — M069 Rheumatoid arthritis, unspecified: Secondary | ICD-10-CM | POA: Diagnosis not present

## 2017-12-30 DIAGNOSIS — I10 Essential (primary) hypertension: Secondary | ICD-10-CM | POA: Diagnosis not present

## 2018-01-01 DIAGNOSIS — E119 Type 2 diabetes mellitus without complications: Secondary | ICD-10-CM | POA: Diagnosis not present

## 2018-01-01 DIAGNOSIS — I10 Essential (primary) hypertension: Secondary | ICD-10-CM | POA: Diagnosis not present

## 2018-01-01 DIAGNOSIS — M6281 Muscle weakness (generalized): Secondary | ICD-10-CM | POA: Diagnosis not present

## 2018-01-01 DIAGNOSIS — M160 Bilateral primary osteoarthritis of hip: Secondary | ICD-10-CM | POA: Diagnosis not present

## 2018-01-01 DIAGNOSIS — K5790 Diverticulosis of intestine, part unspecified, without perforation or abscess without bleeding: Secondary | ICD-10-CM | POA: Diagnosis not present

## 2018-01-01 DIAGNOSIS — M069 Rheumatoid arthritis, unspecified: Secondary | ICD-10-CM | POA: Diagnosis not present

## 2018-01-03 DIAGNOSIS — R300 Dysuria: Secondary | ICD-10-CM | POA: Diagnosis not present

## 2018-01-03 DIAGNOSIS — N390 Urinary tract infection, site not specified: Secondary | ICD-10-CM | POA: Diagnosis not present

## 2018-01-06 ENCOUNTER — Encounter: Payer: Self-pay | Admitting: Hematology and Oncology

## 2018-01-06 DIAGNOSIS — D509 Iron deficiency anemia, unspecified: Secondary | ICD-10-CM | POA: Diagnosis not present

## 2018-01-06 DIAGNOSIS — D72829 Elevated white blood cell count, unspecified: Secondary | ICD-10-CM | POA: Diagnosis not present

## 2018-01-06 DIAGNOSIS — K5790 Diverticulosis of intestine, part unspecified, without perforation or abscess without bleeding: Secondary | ICD-10-CM | POA: Diagnosis not present

## 2018-01-06 DIAGNOSIS — E119 Type 2 diabetes mellitus without complications: Secondary | ICD-10-CM | POA: Diagnosis not present

## 2018-01-06 DIAGNOSIS — I10 Essential (primary) hypertension: Secondary | ICD-10-CM | POA: Diagnosis not present

## 2018-01-06 DIAGNOSIS — M069 Rheumatoid arthritis, unspecified: Secondary | ICD-10-CM | POA: Diagnosis not present

## 2018-01-06 DIAGNOSIS — M6281 Muscle weakness (generalized): Secondary | ICD-10-CM | POA: Diagnosis not present

## 2018-01-06 DIAGNOSIS — M160 Bilateral primary osteoarthritis of hip: Secondary | ICD-10-CM | POA: Diagnosis not present

## 2018-01-06 DIAGNOSIS — E46 Unspecified protein-calorie malnutrition: Secondary | ICD-10-CM | POA: Diagnosis not present

## 2018-01-06 NOTE — Assessment & Plan Note (Signed)
84 y.o. female with extensive past medical history including severe rheumatoid arthritis with bilateral thumb deformity.  Patient has history of recurrent gastrointestinal bleeding including at least 2 admissions over the past month.  Referred for severe anemia requiring transfusions following episodes of hematochezia.  Clear combination of blood loss anemia and anemia of iron deficiency.  My preference would be for the patient undergo repeat gastrointestinal evaluation by EGD and colonoscopy to assess the exact etiology of the bleeding to appropriately plan the care for the patient.  And pseudo-normal iron value based on ferritin.  At this time, patient is not interested in parenteral iron supplementation but is open to possibility of taking iron pills.  Plan: -Increase iron supplementation to ferrous sulfate 3 times daily. -No parenteral iron supplementation at this time --Consult GI for EGD/Colonoscopy -Return to clinic in 4 weeks: Labs 2-3 days prior and possible initiation of IV iron infusions if patient is not responding appropriate to increased iron replacement

## 2018-01-06 NOTE — Progress Notes (Signed)
[facility] Follow-up Visit:  Assessment: Iron deficiency anemia due to chronic blood loss 84 y.o. female with extensive past medical history including severe rheumatoid arthritis with bilateral thumb deformity.  Patient has history of recurrent gastrointestinal bleeding including at least 2 admissions over the past month.  Referred for severe anemia requiring transfusions following episodes of hematochezia.  Clear combination of blood loss anemia and anemia of iron deficiency.  My preference would be for the patient undergo repeat gastrointestinal evaluation by EGD and colonoscopy to assess the exact etiology of the bleeding to appropriately plan the care for the patient.  And pseudo-normal iron value based on ferritin.  At this time, patient is not interested in parenteral iron supplementation but is open to possibility of taking iron pills.  Plan: -Increase iron supplementation to ferrous sulfate 3 times daily. -No parenteral iron supplementation at this time --Consult GI for EGD/Colonoscopy -Return to clinic in 4 weeks: Labs 2-3 days prior and possible initiation of IV iron infusions if patient is not responding appropriate to increased iron replacement   Voice recognition software was used and creation of this note. Despite my best effort at editing the text, some misspelling/errors may have occurred.  Orders Placed This Encounter  Procedures  . CBC with Differential (Cancer Center Only)    Standing Status:   Future    Standing Expiration Date:   12/25/2018  . CMP ([facility] only)    Standing Status:   Future    Standing Expiration Date:   12/25/2018  . Iron and TIBC    Standing Status:   Future    Standing Expiration Date:   12/25/2018  . Ferritin    Standing Status:   Future    Standing Expiration Date:   12/25/2018    Cancer Staging No matching staging information was found for the patient.  All questions were answered.  . The patient knows to call the  clinic with any problems, questions or concerns.  This note was electronically signed.    History of Presenting Illness [name] is an 84 y.o. female followed in the [facility] for severe recurrent anemia due to lower gastrointestinal bleeding, referred by patient's past medical history significant for hyperlipidemia, anxiety/depression,Dr Deland Pretty.  Spinal stenosis, type 2 diabetes mellitus, rheumatoid arthritis on chronic prednisone and methotrexate, hypertension, hypothyroidism, and history of diverticulosis with recurrent gastrointestinal bleeding previously requiring transfusions.  Patient is currently receiving iron supplement with liquid taking 2 teaspoons/day.  I do not see the medication listed on her list.  Patient is not taking ferrous sulfate which is listed.  At the present time, patient denies any fever, chills, night sweats.  Denies abdominal pain.  Stable musculoskeletal complaints.  Patient denies episodes of melena or hematochezia.  Oncological/hematological History: --Labs, 10/14/17: WBC 6.9, Hgb 10.6, MCV 86.4, MCH 26.2, MCHC 30.4, RDW 15.5, Plt 378; Fe 23, FeSat 7%, TIBC 319, Ferritin ... --Admission, 03/08-09/19: Admitted for lower GI bleeding likely from a diverticular source with self-contained hematochezia.  Evaluated by gastroenterology who recommended no intervention at the time. --Labs, 12/04/17: WBC 8.4, Hgb   7.2, MCV 78.9, MCH 23.4, MCHC 29.6, RDW 18.0, Plt 406; FOBT -- positive --Admission, 03/14-16/19: Admitted for slurred speech, profound weakness.  Medical record describes precedent episode of hematochezia. --Treatment:             --Transfusion, 12/05/17: pRBC x2Units --Labs, 12/07/17: WBC 7.7, Hgb   9.5, MCV 78.8, MCH 23.9, MCHC 30.4, RDW 17.9, Plt 358; --  Labs, 12/17/17: WBC 7.9, Hgb   9.4, MCV 75.8, MCH 24.1, MCHC 31.8, RDW 19.0, Plt 432; Fe 19, FeSat 7%, TIBC 276, Ferritin 38, Cu 110, Ceruloplasmin 24.0   No history exists.    Medical  History: Past Medical History:  Diagnosis Date  . Acute bronchitis   . Anemia, unspecified   . Atrial fibrillation ([place])   . Depressive disorder, not elsewhere classified   . Diverticulosis of colon (without mention of hemorrhage)   . Esophageal reflux   . Family history of malignant neoplasm of gastrointestinal tract   . Functional diarrhea   . Hiatal hernia   . History of blood transfusion    "once; related to diverticulitis" (11/29/2017)  . Other and unspecified hyperlipidemia   . Other chest pain   . Other specified cardiac dysrhythmias(427.89)   . Personal history of colonic polyps 07/17/1995   adenomatous polyps  . PONV (postoperative nausea and vomiting)   . Recurrent UTI (urinary tract infection)    "2-3 times in the last 1 1/53yr (11/29/2017)  . Rheumatoid arthritis ([place]   . Tachycardia, unspecified   . Type II diabetes mellitus ([place]   . Unspecified adverse effect of unspecified drug, medicinal and biological substance   . Unspecified essential hypertension   . Unspecified hypothyroidism     Surgical History: Past Surgical History:  Procedure Laterality Date  . ABDOMINAL HYSTERECTOMY     "partial"  . CATARACT EXTRACTION Bilateral   . FOREARM FRACTURE SURGERY Right 1940s?  . FRACTURE SURGERY    . ORIF FEMUR FRACTURE  08/30/2012   Procedure: OPEN REDUCTION INTERNAL FIXATION (ORIF) DISTAL FEMUR FRACTURE;  Surgeon: MMauri Pole MD;  Location: [facility];  Service: Orthopedics;  Laterality: Right;  proximal femur  . THYROIDECTOMY, PARTIAL      Family History: Family History  Problem Relation Age of Onset  . Colon cancer Maternal Grandmother     Social History: Social History   Socioeconomic History  . Marital status: Widowed    Spouse name: Not on file  . Number of children: 3  . Years of education: Not on file  . Highest education level: Not on file  Occupational History    Employer: RETIRED  Social Needs  . Financial resource strain: Not on file  . Food  insecurity:    Worry: Not on file    Inability: Not on file  . Transportation needs:    Medical: Not on file    Non-medical: Not on file  Tobacco Use  . Smoking status: Never Smoker  . Smokeless tobacco: Never Used  Substance and Sexual Activity  . Alcohol use: Yes    Alcohol/week: 0.6 oz    Types: 1 Glasses of wine per week  . Drug use: No  . Sexual activity: Never  Lifestyle  . Physical activity:    Days per week: Not on file    Minutes per session: Not on file  . Stress: Not on file  Relationships  . Social connections:    Talks on phone: Not on file    Gets together: Not on file    Attends religious service: Not on file    Active member of club or organization: Not on file    Attends meetings of clubs or organizations: Not on file    Relationship status: Not on file  . Intimate partner violence:    Fear of current or ex partner: Not on file    Emotionally abused: Not on file  Physically abused: Not on file    Forced sexual activity: Not on file  Other Topics Concern  . Not on file  Social History Narrative  . Not on file    Allergies: No Known Allergies  Medications:  Current Outpatient Medications  Medication Sig Dispense Refill  . doxycycline (VIBRA-TABS) 100 MG tablet Take 1 tablet (100 mg total) by mouth 2 (two) times daily. 20 tablet 0  . feeding supplement, ENSURE ENLIVE, (ENSURE ENLIVE) LIQD Take 237 mLs by mouth 2 (two) times daily between meals. 237 mL 12  . ferrous sulfate 325 (65 FE) MG EC tablet Take 1 tablet (325 mg total) by mouth 2 (two) times daily. 60 tablet 3  . folic acid (FOLVITE) 1 MG tablet Take 1 mg by mouth at bedtime.     Marland Kitchen leflunomide (ARAVA) 20 MG tablet Take 20 mg by mouth daily.    Marland Kitchen levothyroxine (SYNTHROID) 100 MCG tablet Take 1 tablet (100 mcg total) by mouth daily. 30 tablet 11  . magnesium oxide (MAG-OX) 400 (241.3 Mg) MG tablet Take 0.5 tablets (200 mg total) by mouth 2 (two) times daily. 10 tablet 0  . Melatonin 2.5 MG CAPS  Take 1 capsule (2.5 mg total) by mouth at bedtime as needed (for sleep). 30 capsule 0  . metFORMIN (GLUCOPHAGE-XR) 500 MG 24 hr tablet Take 500-1,000 mg by mouth See admin instructions. Take 2 tablets in the morning, and 1 tablet in the evening    . omeprazole (PRILOSEC) 40 MG capsule Take 1 capsule (40 mg total) by mouth 2 (two) times daily. 60 capsule 0  . Pitavastatin Calcium (LIVALO) 2 MG TABS Take 2 mg by mouth every Monday, Wednesday, and Friday at 6 PM.     . predniSONE (DELTASONE) 1 MG tablet Take 4 mg by mouth daily with breakfast.     . VOLTAREN 1 % GEL Apply 2 g topically 4 (four) times daily.      No current facility-administered medications for this visit.     Review of Systems: Review of Systems  Constitutional: Positive for fatigue.  Musculoskeletal: Positive for arthralgias.  All other systems reviewed and are negative.    PHYSICAL EXAMINATION Blood pressure (!) 148/72, pulse 94, temperature (!) 97.5 F (36.4 C), temperature source Oral, resp. rate 20, height '5\' 5"'$  (1.651 m), weight 95 lb 8 oz (43.3 kg), SpO2 100 %.  ECOG PERFORMANCE STATUS: 3 - Symptomatic, >50% confined to bed  Physical Exam  Constitutional: No distress.  Frail elderly female.   HENT:  Head: Normocephalic and atraumatic.  Mouth/Throat: Oropharynx is clear and moist. No oropharyngeal exudate.  Eyes: Pupils are equal, round, and reactive to light. EOM are normal. No scleral icterus.  Neck: No thyromegaly present.  Cardiovascular: Normal rate and regular rhythm.  No murmur heard. Pulmonary/Chest: Effort normal and breath sounds normal. No stridor. No respiratory distress. She has no wheezes. She has no rales.  Abdominal: Soft. Bowel sounds are normal. She exhibits no distension. There is no tenderness. There is no guarding.  Musculoskeletal: She exhibits no edema.  Lymphadenopathy:    She has no cervical adenopathy.  Neurological: She is alert. She displays normal reflexes. No cranial nerve  deficit or sensory deficit.  Skin: Skin is warm and dry. She is not diaphoretic. No erythema. There is pallor.     LABORATORY DATA: I have personally reviewed the data as listed: No visits with results within 1 Week(s) from this visit.  Latest known visit with results is:  Appointment on 12/17/2017  Component Date Value Ref Range Status  . Ceruloplasmin 12/17/2017 24.0  19.0 - 39.0 mg/dL Final   Comment: (NOTE) Performed At: [facility] [place], [place] [id number] Rush Farmer MD [id number] Performed at [facility], [place] [address], [place], [place] [address]   . Copper 12/17/2017 110  72 - 166 ug/dL Final   Comment: (NOTE) This test was developed and its performance characteristics determined by [facility]. It has not been cleared or approved by the Food and Drug Administration.                                Detection Limit = 5 Performed At: [facility] [place], [place] [id number] Rush Farmer MD [id number] Performed at [facility], [place] [address] [place], [place] [address]   . Ferritin 12/17/2017 38  9 - 269 ng/mL Final   Performed at [facility], [place] [address], [place], [place] [address]  . Iron 12/17/2017 19* 41 - 142 ug/dL Final  . TIBC 12/17/2017 276  236 - 444 ug/dL Final  . Saturation Ratios 12/17/2017 7* 21 - 57 % Final  . UIBC 12/17/2017 258  ug/dL Final   Performed at [facility], [place] [address] [place], [place] [address]  . Sodium 12/17/2017 139  136 - 145 mmol/L Final  . Potassium 12/17/2017 3.8  3.5 - 5.1 mmol/L Final  . Chloride 12/17/2017 104  98 - 109 mmol/L Final  . CO2 12/17/2017 27  22 - 29 mmol/L Final  . Glucose, Bld 12/17/2017 240* 70 - 140 mg/dL Final  . BUN 12/17/2017 14  7 - 26 mg/dL Final  . Creatinine 12/17/2017 0.68  0.60 - 1.10 mg/dL Final  . Calcium 12/17/2017 8.8  8.4 - 10.4 mg/dL  Final  . Total Protein 12/17/2017 6.3* 6.4 - 8.3 g/dL Final  . Albumin 12/17/2017 2.8* 3.5 - 5.0 g/dL Final  . AST 12/17/2017 12  5 - 34 U/L Final  . ALT 12/17/2017 8  0 - 55 U/L Final  . Alkaline Phosphatase 12/17/2017 75  40 - 150 U/L Final  . Total Bilirubin 12/17/2017 0.3  0.2 - 1.2 mg/dL Final  . GFR, Est Non Af Am 12/17/2017 >60  >60 mL/min Final  . GFR, Est AFR Am 12/17/2017 >60  >60 mL/min Final   Comment: (NOTE) The eGFR has been calculated using the CKD EPI equation. This calculation has not been validated in all clinical situations. eGFR's persistently <60 mL/min signify possible Chronic Kidney Disease.   Georgiann Hahn gap 12/17/2017 8  3 - 11 Final   Performed at [facility], [place] [address] [place],  [address]  . WBC Count 12/17/2017 7.9  3.9 - 10.3 K/uL Final  . RBC 12/17/2017 3.91  3.70 - 5.45 MIL/uL Final  . Hemoglobin 12/17/2017 9.4* 11.6 - 15.9 g/dL Final  . HCT 12/17/2017 29.7* 34.8 - 46.6 % Final  . MCV 12/17/2017 75.8* 79.5 - 101.0 fL Final  . MCH 12/17/2017 24.1* 25.1 - 34.0 pg Final  . MCHC 12/17/2017 31.8  31.5 - 36.0 g/dL Final  . RDW 12/17/2017 19.0* 11.2 - 14.5 % Final  . Platelet Count 12/17/2017 432* 145 - 400 K/uL Final  . Neutrophils Relative % 12/17/2017 78  % Final  . Neutro Abs 12/17/2017 6.2  1.5 - 6.5 K/uL  Final  . Lymphocytes Relative 12/17/2017 11  % Final  . Lymphs Abs 12/17/2017 0.9  0.9 - 3.3 K/uL Final  . Monocytes Relative 12/17/2017 8  % Final  . Monocytes Absolute 12/17/2017 0.7  0.1 - 0.9 K/uL Final  . Eosinophils Relative 12/17/2017 2  % Final  . Eosinophils Absolute 12/17/2017 0.2  0.0 - 0.5 K/uL Final  . Basophils Relative 12/17/2017 1  % Final  . Basophils Absolute 12/17/2017 0.0  0.0 - 0.1 K/uL Final   Performed at [facility], [place] [address], [place], [place] [address]       Ardath Sax, MD

## 2018-01-07 DIAGNOSIS — M160 Bilateral primary osteoarthritis of hip: Secondary | ICD-10-CM | POA: Diagnosis not present

## 2018-01-07 DIAGNOSIS — K5790 Diverticulosis of intestine, part unspecified, without perforation or abscess without bleeding: Secondary | ICD-10-CM | POA: Diagnosis not present

## 2018-01-07 DIAGNOSIS — I10 Essential (primary) hypertension: Secondary | ICD-10-CM | POA: Diagnosis not present

## 2018-01-07 DIAGNOSIS — M6281 Muscle weakness (generalized): Secondary | ICD-10-CM | POA: Diagnosis not present

## 2018-01-07 DIAGNOSIS — M069 Rheumatoid arthritis, unspecified: Secondary | ICD-10-CM | POA: Diagnosis not present

## 2018-01-07 DIAGNOSIS — E119 Type 2 diabetes mellitus without complications: Secondary | ICD-10-CM | POA: Diagnosis not present

## 2018-01-09 ENCOUNTER — Encounter: Payer: Self-pay | Admitting: Podiatry

## 2018-01-09 ENCOUNTER — Ambulatory Visit (INDEPENDENT_AMBULATORY_CARE_PROVIDER_SITE_OTHER): Payer: Medicare Other | Admitting: Podiatry

## 2018-01-09 DIAGNOSIS — L97522 Non-pressure chronic ulcer of other part of left foot with fat layer exposed: Secondary | ICD-10-CM

## 2018-01-10 DIAGNOSIS — K5790 Diverticulosis of intestine, part unspecified, without perforation or abscess without bleeding: Secondary | ICD-10-CM | POA: Diagnosis not present

## 2018-01-10 DIAGNOSIS — M160 Bilateral primary osteoarthritis of hip: Secondary | ICD-10-CM | POA: Diagnosis not present

## 2018-01-10 DIAGNOSIS — E119 Type 2 diabetes mellitus without complications: Secondary | ICD-10-CM | POA: Diagnosis not present

## 2018-01-10 DIAGNOSIS — M6281 Muscle weakness (generalized): Secondary | ICD-10-CM | POA: Diagnosis not present

## 2018-01-10 DIAGNOSIS — M069 Rheumatoid arthritis, unspecified: Secondary | ICD-10-CM | POA: Diagnosis not present

## 2018-01-10 DIAGNOSIS — I10 Essential (primary) hypertension: Secondary | ICD-10-CM | POA: Diagnosis not present

## 2018-01-13 DIAGNOSIS — E119 Type 2 diabetes mellitus without complications: Secondary | ICD-10-CM | POA: Diagnosis not present

## 2018-01-13 DIAGNOSIS — K5790 Diverticulosis of intestine, part unspecified, without perforation or abscess without bleeding: Secondary | ICD-10-CM | POA: Diagnosis not present

## 2018-01-13 DIAGNOSIS — M6281 Muscle weakness (generalized): Secondary | ICD-10-CM | POA: Diagnosis not present

## 2018-01-13 DIAGNOSIS — I10 Essential (primary) hypertension: Secondary | ICD-10-CM | POA: Diagnosis not present

## 2018-01-13 DIAGNOSIS — M160 Bilateral primary osteoarthritis of hip: Secondary | ICD-10-CM | POA: Diagnosis not present

## 2018-01-13 DIAGNOSIS — M069 Rheumatoid arthritis, unspecified: Secondary | ICD-10-CM | POA: Diagnosis not present

## 2018-01-14 NOTE — Progress Notes (Signed)
Subjective: 84 year old female presents the office today for follow-up evaluation of a wound on the left foot along the first metatarsal head medially.  There is to change the bandage but she is not sure what they have been putting on it.  She is unsure how the wound is been doing she presents today alone without a caregiver.  She has been in the surgical shoe however. Denies any systemic complaints such as fevers, chills, nausea, vomiting. No acute changes since last appointment, and no other complaints at this time.   Objective: AAO x3, NAD DP/PT pulses palpable bilaterally, CRT less than 3 seconds Significant digital deformity is present bilaterally and significant HAV is present and there is associated ulceration along the medial aspect of the first metatarsal head with a granular wound base.  There is actually a small area that has some hyper granulation tissue present more along the proximal aspect of the wound.  There is no probing to bone today and there is no ascending sialitis.  There is faint rim of erythema but I think this is more from inflammation as opposed to infection.  There is no malodor.   No other open lesions or pre-ulcerative lesions.  No pain with calf compression, swelling, warmth, erythema  Assessment: Ulceration left foot with improvement  Plan: -All treatment options discussed with the patient including all alternatives, risks, complications.  -Selected apply a small amount of silver nitrate to the hyper granulated part of the wound.  I want her to continue with collagen silver dressing changes daily.  She did have this on her today so she was some callus at home but she is unsure.  Continue offloading at all times in surgical shoe.  Finish course of antibiotics that she has. -Follow-up in 2 weeks or sooner if needed. -Patient encouraged to call the office with any questions, concerns, change in symptoms.   *X-ray left foot next appointment  Trula Slade DPM

## 2018-01-15 DIAGNOSIS — M6281 Muscle weakness (generalized): Secondary | ICD-10-CM | POA: Diagnosis not present

## 2018-01-15 DIAGNOSIS — M069 Rheumatoid arthritis, unspecified: Secondary | ICD-10-CM | POA: Diagnosis not present

## 2018-01-15 DIAGNOSIS — M160 Bilateral primary osteoarthritis of hip: Secondary | ICD-10-CM | POA: Diagnosis not present

## 2018-01-15 DIAGNOSIS — E119 Type 2 diabetes mellitus without complications: Secondary | ICD-10-CM | POA: Diagnosis not present

## 2018-01-15 DIAGNOSIS — I10 Essential (primary) hypertension: Secondary | ICD-10-CM | POA: Diagnosis not present

## 2018-01-15 DIAGNOSIS — K5790 Diverticulosis of intestine, part unspecified, without perforation or abscess without bleeding: Secondary | ICD-10-CM | POA: Diagnosis not present

## 2018-01-20 ENCOUNTER — Inpatient Hospital Stay: Payer: Medicare Other

## 2018-01-20 DIAGNOSIS — N390 Urinary tract infection, site not specified: Secondary | ICD-10-CM | POA: Diagnosis not present

## 2018-01-20 DIAGNOSIS — R358 Other polyuria: Secondary | ICD-10-CM | POA: Diagnosis not present

## 2018-01-20 DIAGNOSIS — E119 Type 2 diabetes mellitus without complications: Secondary | ICD-10-CM | POA: Diagnosis not present

## 2018-01-20 DIAGNOSIS — M6281 Muscle weakness (generalized): Secondary | ICD-10-CM | POA: Diagnosis not present

## 2018-01-20 DIAGNOSIS — M069 Rheumatoid arthritis, unspecified: Secondary | ICD-10-CM | POA: Diagnosis not present

## 2018-01-20 DIAGNOSIS — M160 Bilateral primary osteoarthritis of hip: Secondary | ICD-10-CM | POA: Diagnosis not present

## 2018-01-20 DIAGNOSIS — I10 Essential (primary) hypertension: Secondary | ICD-10-CM | POA: Diagnosis not present

## 2018-01-20 DIAGNOSIS — K5790 Diverticulosis of intestine, part unspecified, without perforation or abscess without bleeding: Secondary | ICD-10-CM | POA: Diagnosis not present

## 2018-01-21 ENCOUNTER — Telehealth: Payer: Self-pay | Admitting: Podiatry

## 2018-01-21 NOTE — Telephone Encounter (Signed)
She has a caregiver that was changing the dressing. Val- can you call her to see if she needs a nurse? We didn't talk about that.

## 2018-01-21 NOTE — Telephone Encounter (Signed)
Unable to leave a message the phone number pt left rang for over 1 minute, message stated enter your remote access code.

## 2018-01-21 NOTE — Telephone Encounter (Signed)
Patient called and wanted to know about the nurse who is suppose to come out and change the dressing for her foot? She hasn't had anyone come out yet and is curious is to when they'll be coming by. If you can call her back at [phone].

## 2018-01-22 NOTE — Telephone Encounter (Signed)
Left message for pt to call to discuss [place] and wound care.

## 2018-01-22 NOTE — Telephone Encounter (Signed)
OK, she has a caregiver that should be able to help her.

## 2018-01-22 NOTE — Telephone Encounter (Signed)
Unable to leave a message, message states please enter your remote access code.

## 2018-01-22 NOTE — Telephone Encounter (Signed)
I spoke with [name] she states she has [facility] and they have been coming out so long she thinks she is to be discharge soon. I told [name] to have the [facility] nurse call me at Dr. Leigh Aurora office and I would give verbal orders to renew her [place]. [name] states understanding and asked for her next appt. I informed [name] she has an appt 01/24/2018 at 10:45am.

## 2018-01-22 NOTE — Telephone Encounter (Addendum)
[name] - [facility] states she has been trying to get in to see pt, pt has missed numerous [facility] visits or refuses, so she may no longer be considered homebound, they would still be able to have weekly visits if pt had a teachable caregiver for the dressing changes. [facility] can not perform wound care. Ivin Booty states she will trying to go to pt's house prior to 8:00am to catch at home. I told Ivin Booty I would call pt and explain [facility] and teachable caregiver and also inform Dr. Jacqualyn Posey so he could reiterate to pt. I gave verbal order to Ivin Booty to continue the wound care [place] until further instructions were given.

## 2018-01-23 ENCOUNTER — Inpatient Hospital Stay: Payer: Medicare Other

## 2018-01-23 ENCOUNTER — Telehealth: Payer: Self-pay | Admitting: *Deleted

## 2018-01-23 ENCOUNTER — Inpatient Hospital Stay: Payer: Medicare Other | Attending: Hematology and Oncology | Admitting: Hematology and Oncology

## 2018-01-23 DIAGNOSIS — M160 Bilateral primary osteoarthritis of hip: Secondary | ICD-10-CM | POA: Diagnosis not present

## 2018-01-23 DIAGNOSIS — K5790 Diverticulosis of intestine, part unspecified, without perforation or abscess without bleeding: Secondary | ICD-10-CM | POA: Diagnosis not present

## 2018-01-23 DIAGNOSIS — I10 Essential (primary) hypertension: Secondary | ICD-10-CM | POA: Diagnosis not present

## 2018-01-23 DIAGNOSIS — M069 Rheumatoid arthritis, unspecified: Secondary | ICD-10-CM | POA: Diagnosis not present

## 2018-01-23 DIAGNOSIS — M6281 Muscle weakness (generalized): Secondary | ICD-10-CM | POA: Diagnosis not present

## 2018-01-23 DIAGNOSIS — E119 Type 2 diabetes mellitus without complications: Secondary | ICD-10-CM | POA: Diagnosis not present

## 2018-01-23 NOTE — Telephone Encounter (Signed)
[name] - [facility] states today pt's shin wound has completely healed, the left bunion is a small 0.5cm circle withearly granulation with maceration at borders, she cut calcium alginate to fit in the center and covered with bandaid, area looks good. Ivin Booty states she reminded pt of 01/24/2018 appt and will wait for any new orders.

## 2018-01-24 ENCOUNTER — Ambulatory Visit (INDEPENDENT_AMBULATORY_CARE_PROVIDER_SITE_OTHER): Payer: Medicare Other

## 2018-01-24 ENCOUNTER — Encounter: Payer: Self-pay | Admitting: Podiatry

## 2018-01-24 ENCOUNTER — Ambulatory Visit (INDEPENDENT_AMBULATORY_CARE_PROVIDER_SITE_OTHER): Payer: Medicare Other | Admitting: Podiatry

## 2018-01-24 DIAGNOSIS — M069 Rheumatoid arthritis, unspecified: Secondary | ICD-10-CM | POA: Diagnosis not present

## 2018-01-24 DIAGNOSIS — I10 Essential (primary) hypertension: Secondary | ICD-10-CM | POA: Diagnosis not present

## 2018-01-24 DIAGNOSIS — M160 Bilateral primary osteoarthritis of hip: Secondary | ICD-10-CM | POA: Diagnosis not present

## 2018-01-24 DIAGNOSIS — E119 Type 2 diabetes mellitus without complications: Secondary | ICD-10-CM | POA: Diagnosis not present

## 2018-01-24 DIAGNOSIS — L84 Corns and callosities: Secondary | ICD-10-CM | POA: Diagnosis not present

## 2018-01-24 DIAGNOSIS — L97522 Non-pressure chronic ulcer of other part of left foot with fat layer exposed: Secondary | ICD-10-CM

## 2018-01-24 DIAGNOSIS — M6281 Muscle weakness (generalized): Secondary | ICD-10-CM | POA: Diagnosis not present

## 2018-01-24 DIAGNOSIS — K5790 Diverticulosis of intestine, part unspecified, without perforation or abscess without bleeding: Secondary | ICD-10-CM | POA: Diagnosis not present

## 2018-01-26 NOTE — Progress Notes (Signed)
Subjective: 84 year old female presents the office today for follow-up evaluation of a wound on the left foot along the first metatarsal head medially.  She is a home health nurse to come to the wound to change the bandage.  Her caregivers daily could not change the bandage.  She has been in the surgical shoe.  She is not sure how the wound is been doing.  She has no new concerns. Denies any systemic complaints such as fevers, chills, nausea, vomiting. No acute changes since last appointment, and no other complaints at this time.   Objective: AAO x3, NAD, presents with caregiver DP/PT pulses palpable bilaterally, CRT less than 3 seconds Significant digital deformity is present bilaterally and significant HAV is present and there is associated ulceration along the medial aspect of the first metatarsal head with a granular wound base.  The wound measures approximate 0.7 x 0.7 cm however it is more granular and feeling then.  There is no probing to bone.  There is no surrounding significant ascending cellulitis.  There is a faint rim of surrounding erythema but this is likely more from inflammation as opposed to infection.  There is no red streaks.  No fluctuation or crepitation or any malodor. On the right foot there is also a small pre-ulcerative area to the right second toe as well as the right bunion.  No open sores are identified there is no signs of infection. No other open lesions or pre-ulcerative lesions.  No pain with calf compression, swelling, warmth, erythema  Assessment: Ulceration left foot with improvement  Plan: -All treatment options discussed with the patient including all alternatives, risks, complications.  -X-rays were obtained and reviewed.  Chronic digital deformities are present as well as subluxation of the first MPJ.  Cystic changes present the first MPJ but this is likely chronic and there is no definitive cortical destruction suggestive of osteomyelitis.  There is no soft  tissue emphysema. -We will continue with collagen, silver dressing changes daily to the left foot.  I will put a new wound care orders to at least have 3 times a week dressing changes to the area.  I want her to continue the surgical shoe and offloading at all times. -Offloading all times of the right foot and continue to monitor very closely for this. -Monitor for any clinical signs or symptoms of infection and directed to call the office immediately should any occur or go to the ER. -RTC 2 weeks or sooner if needed.  Trula Slade DPM

## 2018-01-27 ENCOUNTER — Telehealth: Payer: Self-pay | Admitting: *Deleted

## 2018-01-27 NOTE — Telephone Encounter (Signed)
Faxed Dr. Leigh Aurora 01/26/2018 8:53am wound care orders to [facility].

## 2018-01-27 NOTE — Telephone Encounter (Signed)
[name] states she knows pt was seen Friday, and wanted to know new wound care, frequency and necessity of the wound care. Orders for wound care were faxed to [facility] this morning.

## 2018-01-27 NOTE — Telephone Encounter (Signed)
-----   Message from Trula Slade, DPM sent at 01/26/2018  8:53 AM EDT ----- Can you please put a new wound care orders for her?  It will be collagen, silver dressing change daily followed by gauze.  Wear surgical shoe at all times.  She should already have the supplies at home.

## 2018-01-28 DIAGNOSIS — M069 Rheumatoid arthritis, unspecified: Secondary | ICD-10-CM | POA: Diagnosis not present

## 2018-01-28 DIAGNOSIS — E119 Type 2 diabetes mellitus without complications: Secondary | ICD-10-CM | POA: Diagnosis not present

## 2018-01-28 DIAGNOSIS — K5790 Diverticulosis of intestine, part unspecified, without perforation or abscess without bleeding: Secondary | ICD-10-CM | POA: Diagnosis not present

## 2018-01-28 DIAGNOSIS — I10 Essential (primary) hypertension: Secondary | ICD-10-CM | POA: Diagnosis not present

## 2018-01-28 DIAGNOSIS — M6281 Muscle weakness (generalized): Secondary | ICD-10-CM | POA: Diagnosis not present

## 2018-01-28 DIAGNOSIS — M160 Bilateral primary osteoarthritis of hip: Secondary | ICD-10-CM | POA: Diagnosis not present

## 2018-01-28 NOTE — Telephone Encounter (Signed)
[name] - [facility] request copy of orders and end date. I refaxed orders 01/27/2018 to [facility] and informed [name] verbally the end date would be 02/07/2018, after evaluated by Dr. Jacqualyn Posey.

## 2018-01-29 ENCOUNTER — Telehealth: Payer: Self-pay | Admitting: *Deleted

## 2018-01-29 DIAGNOSIS — Z79899 Other long term (current) drug therapy: Secondary | ICD-10-CM | POA: Diagnosis not present

## 2018-01-29 DIAGNOSIS — M069 Rheumatoid arthritis, unspecified: Secondary | ICD-10-CM | POA: Diagnosis not present

## 2018-01-29 DIAGNOSIS — N390 Urinary tract infection, site not specified: Secondary | ICD-10-CM | POA: Diagnosis not present

## 2018-01-29 DIAGNOSIS — M6281 Muscle weakness (generalized): Secondary | ICD-10-CM | POA: Diagnosis not present

## 2018-01-29 DIAGNOSIS — E7800 Pure hypercholesterolemia, unspecified: Secondary | ICD-10-CM | POA: Diagnosis not present

## 2018-01-29 DIAGNOSIS — K5790 Diverticulosis of intestine, part unspecified, without perforation or abscess without bleeding: Secondary | ICD-10-CM | POA: Diagnosis not present

## 2018-01-29 DIAGNOSIS — E119 Type 2 diabetes mellitus without complications: Secondary | ICD-10-CM | POA: Diagnosis not present

## 2018-01-29 DIAGNOSIS — I10 Essential (primary) hypertension: Secondary | ICD-10-CM | POA: Diagnosis not present

## 2018-01-29 DIAGNOSIS — M160 Bilateral primary osteoarthritis of hip: Secondary | ICD-10-CM | POA: Diagnosis not present

## 2018-01-29 DIAGNOSIS — D72829 Elevated white blood cell count, unspecified: Secondary | ICD-10-CM | POA: Diagnosis not present

## 2018-01-29 DIAGNOSIS — E039 Hypothyroidism, unspecified: Secondary | ICD-10-CM | POA: Diagnosis not present

## 2018-01-29 DIAGNOSIS — E1149 Type 2 diabetes mellitus with other diabetic neurological complication: Secondary | ICD-10-CM | POA: Diagnosis not present

## 2018-01-29 DIAGNOSIS — M810 Age-related osteoporosis without current pathological fracture: Secondary | ICD-10-CM | POA: Diagnosis not present

## 2018-01-29 NOTE — Telephone Encounter (Signed)
[name] states they can not do daily dressing changes, but can do 3 times a week until 02/07/2018 reevaluation and will reinstate pt to their care and reiterate to pt the importance of the homebound status.

## 2018-01-30 DIAGNOSIS — I10 Essential (primary) hypertension: Secondary | ICD-10-CM | POA: Diagnosis not present

## 2018-01-30 DIAGNOSIS — M160 Bilateral primary osteoarthritis of hip: Secondary | ICD-10-CM | POA: Diagnosis not present

## 2018-01-30 DIAGNOSIS — K5790 Diverticulosis of intestine, part unspecified, without perforation or abscess without bleeding: Secondary | ICD-10-CM | POA: Diagnosis not present

## 2018-01-30 DIAGNOSIS — E119 Type 2 diabetes mellitus without complications: Secondary | ICD-10-CM | POA: Diagnosis not present

## 2018-01-30 DIAGNOSIS — M069 Rheumatoid arthritis, unspecified: Secondary | ICD-10-CM | POA: Diagnosis not present

## 2018-01-30 DIAGNOSIS — M6281 Muscle weakness (generalized): Secondary | ICD-10-CM | POA: Diagnosis not present

## 2018-01-31 DIAGNOSIS — M160 Bilateral primary osteoarthritis of hip: Secondary | ICD-10-CM | POA: Diagnosis not present

## 2018-01-31 DIAGNOSIS — E11621 Type 2 diabetes mellitus with foot ulcer: Secondary | ICD-10-CM | POA: Diagnosis not present

## 2018-01-31 DIAGNOSIS — D649 Anemia, unspecified: Secondary | ICD-10-CM | POA: Diagnosis not present

## 2018-01-31 DIAGNOSIS — L97522 Non-pressure chronic ulcer of other part of left foot with fat layer exposed: Secondary | ICD-10-CM | POA: Diagnosis not present

## 2018-01-31 DIAGNOSIS — M810 Age-related osteoporosis without current pathological fracture: Secondary | ICD-10-CM | POA: Diagnosis not present

## 2018-01-31 DIAGNOSIS — M4800 Spinal stenosis, site unspecified: Secondary | ICD-10-CM | POA: Diagnosis not present

## 2018-01-31 DIAGNOSIS — Z7984 Long term (current) use of oral hypoglycemic drugs: Secondary | ICD-10-CM | POA: Diagnosis not present

## 2018-01-31 DIAGNOSIS — F329 Major depressive disorder, single episode, unspecified: Secondary | ICD-10-CM | POA: Diagnosis not present

## 2018-01-31 DIAGNOSIS — F419 Anxiety disorder, unspecified: Secondary | ICD-10-CM | POA: Diagnosis not present

## 2018-01-31 DIAGNOSIS — E43 Unspecified severe protein-calorie malnutrition: Secondary | ICD-10-CM | POA: Diagnosis not present

## 2018-01-31 DIAGNOSIS — K5790 Diverticulosis of intestine, part unspecified, without perforation or abscess without bleeding: Secondary | ICD-10-CM | POA: Diagnosis not present

## 2018-01-31 DIAGNOSIS — E039 Hypothyroidism, unspecified: Secondary | ICD-10-CM | POA: Diagnosis not present

## 2018-01-31 DIAGNOSIS — I10 Essential (primary) hypertension: Secondary | ICD-10-CM | POA: Diagnosis not present

## 2018-01-31 DIAGNOSIS — M069 Rheumatoid arthritis, unspecified: Secondary | ICD-10-CM | POA: Diagnosis not present

## 2018-01-31 DIAGNOSIS — Z9181 History of falling: Secondary | ICD-10-CM | POA: Diagnosis not present

## 2018-02-03 DIAGNOSIS — N3000 Acute cystitis without hematuria: Secondary | ICD-10-CM | POA: Diagnosis not present

## 2018-02-03 DIAGNOSIS — E039 Hypothyroidism, unspecified: Secondary | ICD-10-CM | POA: Diagnosis not present

## 2018-02-03 DIAGNOSIS — M160 Bilateral primary osteoarthritis of hip: Secondary | ICD-10-CM | POA: Diagnosis not present

## 2018-02-03 DIAGNOSIS — M4120 Other idiopathic scoliosis, site unspecified: Secondary | ICD-10-CM | POA: Diagnosis not present

## 2018-02-03 DIAGNOSIS — M5136 Other intervertebral disc degeneration, lumbar region: Secondary | ICD-10-CM | POA: Diagnosis not present

## 2018-02-03 DIAGNOSIS — D509 Iron deficiency anemia, unspecified: Secondary | ICD-10-CM | POA: Diagnosis not present

## 2018-02-03 DIAGNOSIS — E1149 Type 2 diabetes mellitus with other diabetic neurological complication: Secondary | ICD-10-CM | POA: Diagnosis not present

## 2018-02-03 DIAGNOSIS — F339 Major depressive disorder, recurrent, unspecified: Secondary | ICD-10-CM | POA: Diagnosis not present

## 2018-02-03 DIAGNOSIS — M069 Rheumatoid arthritis, unspecified: Secondary | ICD-10-CM | POA: Diagnosis not present

## 2018-02-03 DIAGNOSIS — K5790 Diverticulosis of intestine, part unspecified, without perforation or abscess without bleeding: Secondary | ICD-10-CM | POA: Diagnosis not present

## 2018-02-03 DIAGNOSIS — Z0001 Encounter for general adult medical examination with abnormal findings: Secondary | ICD-10-CM | POA: Diagnosis not present

## 2018-02-03 DIAGNOSIS — M0579 Rheumatoid arthritis with rheumatoid factor of multiple sites without organ or systems involvement: Secondary | ICD-10-CM | POA: Diagnosis not present

## 2018-02-03 DIAGNOSIS — I10 Essential (primary) hypertension: Secondary | ICD-10-CM | POA: Diagnosis not present

## 2018-02-03 DIAGNOSIS — L97522 Non-pressure chronic ulcer of other part of left foot with fat layer exposed: Secondary | ICD-10-CM | POA: Diagnosis not present

## 2018-02-03 DIAGNOSIS — D649 Anemia, unspecified: Secondary | ICD-10-CM | POA: Diagnosis not present

## 2018-02-03 DIAGNOSIS — E11621 Type 2 diabetes mellitus with foot ulcer: Secondary | ICD-10-CM | POA: Diagnosis not present

## 2018-02-03 DIAGNOSIS — E7800 Pure hypercholesterolemia, unspecified: Secondary | ICD-10-CM | POA: Diagnosis not present

## 2018-02-03 DIAGNOSIS — M810 Age-related osteoporosis without current pathological fracture: Secondary | ICD-10-CM | POA: Diagnosis not present

## 2018-02-05 DIAGNOSIS — D649 Anemia, unspecified: Secondary | ICD-10-CM | POA: Diagnosis not present

## 2018-02-05 DIAGNOSIS — L97522 Non-pressure chronic ulcer of other part of left foot with fat layer exposed: Secondary | ICD-10-CM | POA: Diagnosis not present

## 2018-02-05 DIAGNOSIS — M160 Bilateral primary osteoarthritis of hip: Secondary | ICD-10-CM | POA: Diagnosis not present

## 2018-02-05 DIAGNOSIS — K5790 Diverticulosis of intestine, part unspecified, without perforation or abscess without bleeding: Secondary | ICD-10-CM | POA: Diagnosis not present

## 2018-02-05 DIAGNOSIS — E11621 Type 2 diabetes mellitus with foot ulcer: Secondary | ICD-10-CM | POA: Diagnosis not present

## 2018-02-05 DIAGNOSIS — M069 Rheumatoid arthritis, unspecified: Secondary | ICD-10-CM | POA: Diagnosis not present

## 2018-02-07 ENCOUNTER — Ambulatory Visit (INDEPENDENT_AMBULATORY_CARE_PROVIDER_SITE_OTHER): Payer: Medicare Other | Admitting: Podiatry

## 2018-02-07 DIAGNOSIS — L97522 Non-pressure chronic ulcer of other part of left foot with fat layer exposed: Secondary | ICD-10-CM

## 2018-02-07 DIAGNOSIS — E11621 Type 2 diabetes mellitus with foot ulcer: Secondary | ICD-10-CM | POA: Diagnosis not present

## 2018-02-07 DIAGNOSIS — K5790 Diverticulosis of intestine, part unspecified, without perforation or abscess without bleeding: Secondary | ICD-10-CM | POA: Diagnosis not present

## 2018-02-07 DIAGNOSIS — D649 Anemia, unspecified: Secondary | ICD-10-CM | POA: Diagnosis not present

## 2018-02-07 DIAGNOSIS — M069 Rheumatoid arthritis, unspecified: Secondary | ICD-10-CM | POA: Diagnosis not present

## 2018-02-07 DIAGNOSIS — M160 Bilateral primary osteoarthritis of hip: Secondary | ICD-10-CM | POA: Diagnosis not present

## 2018-02-10 DIAGNOSIS — K5790 Diverticulosis of intestine, part unspecified, without perforation or abscess without bleeding: Secondary | ICD-10-CM | POA: Diagnosis not present

## 2018-02-10 DIAGNOSIS — M160 Bilateral primary osteoarthritis of hip: Secondary | ICD-10-CM | POA: Diagnosis not present

## 2018-02-10 DIAGNOSIS — M069 Rheumatoid arthritis, unspecified: Secondary | ICD-10-CM | POA: Diagnosis not present

## 2018-02-10 DIAGNOSIS — E11621 Type 2 diabetes mellitus with foot ulcer: Secondary | ICD-10-CM | POA: Diagnosis not present

## 2018-02-10 DIAGNOSIS — L97522 Non-pressure chronic ulcer of other part of left foot with fat layer exposed: Secondary | ICD-10-CM | POA: Diagnosis not present

## 2018-02-10 DIAGNOSIS — D649 Anemia, unspecified: Secondary | ICD-10-CM | POA: Diagnosis not present

## 2018-02-10 DIAGNOSIS — Z8719 Personal history of other diseases of the digestive system: Secondary | ICD-10-CM | POA: Diagnosis not present

## 2018-02-10 NOTE — Progress Notes (Signed)
Subjective: 84 year old female presents the office today for follow-up evaluation of a wound on the left foot along the first metatarsal head medially.  She presents with caregiver who states that the home health nurse comes out 3 times a week to change the bandage.  She also has seen her primary care physician they put in for wound care consult.  She denies any drainage or pus coming from the area and she has no pain.  She is remaining surgical shoe. Denies any systemic complaints such as fevers, chills, nausea, vomiting. No acute changes since last appointment, and no other complaints at this time.   Objective: AAO x3, NAD, presents with caregiver DP/PT pulses palpable bilaterally, CRT less than 3 seconds Significant digital deformity is present bilaterally and significant HAV is present and there is associated ulceration along the medial aspect of the first metatarsal head with a granular wound base.  The wound measures approximate 0.5 x 0.5 cm and the wound is granular.  There is no probing to bone and there is no exposed bone.  There is no surrounding erythema, ascending cellulitis.  No fluctuation or crepitation there is no malodor.  No other open lesions or pre-ulcerative lesions.  No pain with calf compression, swelling, warmth, erythema  Assessment: Chronic ulceration left foot  Plan: -All treatment options discussed with the patient including all alternatives, risks, complications.  -The wound was cleaned today.  Continue with collagen dressing changes daily.  Continue with home health nurse change the bandage 3 times a week.  Continue with surgical shoe and offloading at all times.  She has a wound care consult scheduled I want her to keep this appointment. -Monitor for any clinical signs or symptoms of infection and directed to call the office immediately should any occur or go to the ER.  Trula Slade DPM

## 2018-02-12 DIAGNOSIS — M160 Bilateral primary osteoarthritis of hip: Secondary | ICD-10-CM | POA: Diagnosis not present

## 2018-02-12 DIAGNOSIS — K5790 Diverticulosis of intestine, part unspecified, without perforation or abscess without bleeding: Secondary | ICD-10-CM | POA: Diagnosis not present

## 2018-02-12 DIAGNOSIS — E11621 Type 2 diabetes mellitus with foot ulcer: Secondary | ICD-10-CM | POA: Diagnosis not present

## 2018-02-12 DIAGNOSIS — D649 Anemia, unspecified: Secondary | ICD-10-CM | POA: Diagnosis not present

## 2018-02-12 DIAGNOSIS — L97522 Non-pressure chronic ulcer of other part of left foot with fat layer exposed: Secondary | ICD-10-CM | POA: Diagnosis not present

## 2018-02-12 DIAGNOSIS — M069 Rheumatoid arthritis, unspecified: Secondary | ICD-10-CM | POA: Diagnosis not present

## 2018-02-14 ENCOUNTER — Encounter (HOSPITAL_BASED_OUTPATIENT_CLINIC_OR_DEPARTMENT_OTHER): Payer: Medicare Other | Attending: Internal Medicine

## 2018-02-14 DIAGNOSIS — E11621 Type 2 diabetes mellitus with foot ulcer: Secondary | ICD-10-CM | POA: Insufficient documentation

## 2018-02-14 DIAGNOSIS — L97522 Non-pressure chronic ulcer of other part of left foot with fat layer exposed: Secondary | ICD-10-CM | POA: Insufficient documentation

## 2018-02-14 DIAGNOSIS — I10 Essential (primary) hypertension: Secondary | ICD-10-CM | POA: Insufficient documentation

## 2018-02-14 DIAGNOSIS — D649 Anemia, unspecified: Secondary | ICD-10-CM | POA: Diagnosis not present

## 2018-02-14 DIAGNOSIS — E1142 Type 2 diabetes mellitus with diabetic polyneuropathy: Secondary | ICD-10-CM | POA: Diagnosis not present

## 2018-02-14 DIAGNOSIS — Z7984 Long term (current) use of oral hypoglycemic drugs: Secondary | ICD-10-CM | POA: Insufficient documentation

## 2018-02-14 DIAGNOSIS — M069 Rheumatoid arthritis, unspecified: Secondary | ICD-10-CM | POA: Diagnosis not present

## 2018-02-17 DIAGNOSIS — K5790 Diverticulosis of intestine, part unspecified, without perforation or abscess without bleeding: Secondary | ICD-10-CM | POA: Diagnosis not present

## 2018-02-17 DIAGNOSIS — D649 Anemia, unspecified: Secondary | ICD-10-CM | POA: Diagnosis not present

## 2018-02-17 DIAGNOSIS — M069 Rheumatoid arthritis, unspecified: Secondary | ICD-10-CM | POA: Diagnosis not present

## 2018-02-17 DIAGNOSIS — E11621 Type 2 diabetes mellitus with foot ulcer: Secondary | ICD-10-CM | POA: Diagnosis not present

## 2018-02-17 DIAGNOSIS — L97522 Non-pressure chronic ulcer of other part of left foot with fat layer exposed: Secondary | ICD-10-CM | POA: Diagnosis not present

## 2018-02-17 DIAGNOSIS — M160 Bilateral primary osteoarthritis of hip: Secondary | ICD-10-CM | POA: Diagnosis not present

## 2018-02-25 ENCOUNTER — Emergency Department (HOSPITAL_COMMUNITY): Payer: Medicare Other

## 2018-02-25 ENCOUNTER — Observation Stay (HOSPITAL_COMMUNITY): Payer: Medicare Other

## 2018-02-25 ENCOUNTER — Encounter (HOSPITAL_COMMUNITY): Payer: Self-pay | Admitting: Internal Medicine

## 2018-02-25 ENCOUNTER — Observation Stay (HOSPITAL_COMMUNITY)
Admission: EM | Admit: 2018-02-25 | Discharge: 2018-02-26 | Disposition: A | Discharge status: Home / Self Care | Payer: Medicare Other | Attending: Internal Medicine | Admitting: Internal Medicine

## 2018-02-25 DIAGNOSIS — Z79890 Hormone replacement therapy (postmenopausal): Secondary | ICD-10-CM | POA: Diagnosis not present

## 2018-02-25 DIAGNOSIS — R42 Dizziness and giddiness: Secondary | ICD-10-CM | POA: Diagnosis not present

## 2018-02-25 DIAGNOSIS — E1169 Type 2 diabetes mellitus with other specified complication: Secondary | ICD-10-CM | POA: Insufficient documentation

## 2018-02-25 DIAGNOSIS — Z681 Body mass index (BMI) 19 or less, adult: Secondary | ICD-10-CM | POA: Insufficient documentation

## 2018-02-25 DIAGNOSIS — L8990 Pressure ulcer of unspecified site, unspecified stage: Secondary | ICD-10-CM

## 2018-02-25 DIAGNOSIS — M069 Rheumatoid arthritis, unspecified: Secondary | ICD-10-CM | POA: Insufficient documentation

## 2018-02-25 DIAGNOSIS — R000 Tachycardia, unspecified: Secondary | ICD-10-CM | POA: Diagnosis not present

## 2018-02-25 DIAGNOSIS — E11621 Type 2 diabetes mellitus with foot ulcer: Secondary | ICD-10-CM | POA: Insufficient documentation

## 2018-02-25 DIAGNOSIS — I10 Essential (primary) hypertension: Secondary | ICD-10-CM | POA: Insufficient documentation

## 2018-02-25 DIAGNOSIS — E785 Hyperlipidemia, unspecified: Secondary | ICD-10-CM | POA: Insufficient documentation

## 2018-02-25 DIAGNOSIS — E43 Unspecified severe protein-calorie malnutrition: Secondary | ICD-10-CM | POA: Diagnosis not present

## 2018-02-25 DIAGNOSIS — F329 Major depressive disorder, single episode, unspecified: Secondary | ICD-10-CM | POA: Diagnosis not present

## 2018-02-25 DIAGNOSIS — N390 Urinary tract infection, site not specified: Secondary | ICD-10-CM | POA: Diagnosis not present

## 2018-02-25 DIAGNOSIS — Z87440 Personal history of urinary (tract) infections: Secondary | ICD-10-CM | POA: Insufficient documentation

## 2018-02-25 DIAGNOSIS — R4182 Altered mental status, unspecified: Secondary | ICD-10-CM | POA: Diagnosis not present

## 2018-02-25 DIAGNOSIS — K219 Gastro-esophageal reflux disease without esophagitis: Secondary | ICD-10-CM | POA: Insufficient documentation

## 2018-02-25 DIAGNOSIS — E875 Hyperkalemia: Secondary | ICD-10-CM | POA: Diagnosis not present

## 2018-02-25 DIAGNOSIS — E039 Hypothyroidism, unspecified: Secondary | ICD-10-CM | POA: Insufficient documentation

## 2018-02-25 DIAGNOSIS — R945 Abnormal results of liver function studies: Secondary | ICD-10-CM

## 2018-02-25 DIAGNOSIS — Z7984 Long term (current) use of oral hypoglycemic drugs: Secondary | ICD-10-CM | POA: Diagnosis not present

## 2018-02-25 DIAGNOSIS — Z79899 Other long term (current) drug therapy: Secondary | ICD-10-CM | POA: Diagnosis not present

## 2018-02-25 DIAGNOSIS — Z993 Dependence on wheelchair: Secondary | ICD-10-CM | POA: Diagnosis not present

## 2018-02-25 DIAGNOSIS — I4891 Unspecified atrial fibrillation: Secondary | ICD-10-CM | POA: Insufficient documentation

## 2018-02-25 DIAGNOSIS — I491 Atrial premature depolarization: Secondary | ICD-10-CM | POA: Diagnosis not present

## 2018-02-25 DIAGNOSIS — G249 Dystonia, unspecified: Secondary | ICD-10-CM | POA: Diagnosis not present

## 2018-02-25 DIAGNOSIS — R0902 Hypoxemia: Secondary | ICD-10-CM | POA: Diagnosis not present

## 2018-02-25 DIAGNOSIS — R51 Headache: Secondary | ICD-10-CM | POA: Diagnosis not present

## 2018-02-25 LAB — CBC
HCT: 37.2 % (ref 36.0–46.0)
Hemoglobin: 11.3 g/dL — ABNORMAL LOW (ref 12.0–15.0)
MCH: 24.5 pg — AB (ref 26.0–34.0)
MCHC: 30.4 g/dL (ref 30.0–36.0)
MCV: 80.7 fL (ref 78.0–100.0)
Platelets: 285 10*3/uL (ref 150–400)
RBC: 4.61 MIL/uL (ref 3.87–5.11)
RDW: 17.1 % — AB (ref 11.5–15.5)
WBC: 9.8 10*3/uL (ref 4.0–10.5)

## 2018-02-25 LAB — CBG MONITORING, ED: Glucose-Capillary: 213 mg/dL — ABNORMAL HIGH (ref 65–99)

## 2018-02-25 LAB — URINALYSIS, ROUTINE W REFLEX MICROSCOPIC
Bacteria, UA: NONE SEEN
Bilirubin Urine: NEGATIVE
Glucose, UA: 50 mg/dL — AB
Hgb urine dipstick: NEGATIVE
Ketones, ur: 5 mg/dL — AB
Leukocytes, UA: NEGATIVE
NITRITE: NEGATIVE
PROTEIN: 30 mg/dL — AB
Specific Gravity, Urine: 1.021 (ref 1.005–1.030)
pH: 5 (ref 5.0–8.0)

## 2018-02-25 LAB — COMPREHENSIVE METABOLIC PANEL
ALK PHOS: 60 U/L (ref 38–126)
ALT: 13 U/L — ABNORMAL LOW (ref 14–54)
AST: 58 U/L — AB (ref 15–41)
Albumin: 3.4 g/dL — ABNORMAL LOW (ref 3.5–5.0)
Anion gap: 9 (ref 5–15)
BILIRUBIN TOTAL: 1.2 mg/dL (ref 0.3–1.2)
BUN: 15 mg/dL (ref 6–20)
CALCIUM: 8.6 mg/dL — AB (ref 8.9–10.3)
CO2: 26 mmol/L (ref 22–32)
Chloride: 105 mmol/L (ref 101–111)
Creatinine, Ser: 0.85 mg/dL (ref 0.44–1.00)
GFR calc Af Amer: >60 mL/min (ref >60)
GFR, EST NON AFRICAN AMERICAN: 60 mL/min — AB (ref >60)
Glucose, Bld: 221 mg/dL — ABNORMAL HIGH (ref 65–99)
POTASSIUM: 5.8 mmol/L — AB (ref 3.5–5.1)
Sodium: 140 mmol/L (ref 135–145)
TOTAL PROTEIN: 6.5 g/dL (ref 6.5–8.1)

## 2018-02-25 LAB — PROTIME-INR
INR: 0.96
Prothrombin Time: 12.7 seconds (ref 11.4–15.2)

## 2018-02-25 LAB — DIFFERENTIAL
BASOS PCT: 0 %
Basophils Absolute: 0 10*3/uL (ref 0.0–0.1)
EOS PCT: 1 %
Eosinophils Absolute: 0.1 10*3/uL (ref 0.0–0.7)
LYMPHS ABS: 0.5 10*3/uL — AB (ref 0.7–4.0)
Lymphocytes Relative: 5 %
MONO ABS: 0.7 10*3/uL (ref 0.1–1.0)
Monocytes Relative: 7 %
Neutro Abs: 8.5 10*3/uL — ABNORMAL HIGH (ref 1.7–7.7)
Neutrophils Relative %: 87 %
WBC MORPHOLOGY: INCREASED

## 2018-02-25 LAB — I-STAT CHEM 8, ED
BUN: 17 mg/dL (ref 6–20)
CHLORIDE: 103 mmol/L (ref 101–111)
Calcium, Ion: 0.93 mmol/L — ABNORMAL LOW (ref 1.15–1.40)
Creatinine, Ser: 0.5 mg/dL (ref 0.44–1.00)
Glucose, Bld: 219 mg/dL — ABNORMAL HIGH (ref 65–99)
HEMATOCRIT: 36 % (ref 36.0–46.0)
HEMOGLOBIN: 12.2 g/dL (ref 12.0–15.0)
POTASSIUM: 5.5 mmol/L — AB (ref 3.5–5.1)
SODIUM: 138 mmol/L (ref 135–145)
TCO2: 27 mmol/L (ref 22–32)

## 2018-02-25 LAB — ETHANOL

## 2018-02-25 LAB — GLUCOSE, CAPILLARY: GLUCOSE-CAPILLARY: 118 mg/dL — AB (ref 65–99)

## 2018-02-25 LAB — I-STAT TROPONIN, ED: TROPONIN I, POC: 0.01 ng/mL (ref 0.00–0.08)

## 2018-02-25 LAB — TSH: TSH: 2.057 u[IU]/mL (ref 0.350–4.500)

## 2018-02-25 LAB — APTT: aPTT: 25 seconds (ref 24–36)

## 2018-02-25 LAB — VITAMIN B12: Vitamin B-12: 243 pg/mL (ref 180–914)

## 2018-02-25 LAB — SEDIMENTATION RATE: SED RATE: 7 mm/h (ref 0–22)

## 2018-02-25 MED ORDER — LORAZEPAM 2 MG/ML IJ SOLN
0.5000 mg | Freq: Once | INTRAMUSCULAR | Status: AC
Start: 2018-02-25 — End: 2018-02-25
  Administered 2018-02-25: 0.5 mg via INTRAVENOUS
  Filled 2018-02-25: qty 1

## 2018-02-25 MED ORDER — LEVOTHYROXINE SODIUM 100 MCG PO TABS
100.0000 ug | ORAL_TABLET | Freq: Every day | ORAL | Status: DC
  Administered 2018-02-26: 100 ug via ORAL
  Filled 2018-02-25: qty 1

## 2018-02-25 MED ORDER — DICLOFENAC SODIUM 1 % TD GEL
2.0000 g | Freq: Four times a day (QID) | TRANSDERMAL | Status: DC
  Administered 2018-02-25 – 2018-02-26 (×4): 2 g via TOPICAL
  Filled 2018-02-25: qty 100

## 2018-02-25 MED ORDER — PREDNISONE 1 MG PO TABS
4.0000 mg | ORAL_TABLET | Freq: Every day | ORAL | Status: DC
  Administered 2018-02-26: 4 mg via ORAL
  Filled 2018-02-25: qty 4

## 2018-02-25 MED ORDER — PANTOPRAZOLE SODIUM 40 MG PO TBEC
40.0000 mg | DELAYED_RELEASE_TABLET | Freq: Every day | ORAL | Status: DC
  Administered 2018-02-26: 40 mg via ORAL
  Filled 2018-02-25: qty 1

## 2018-02-25 MED ORDER — INSULIN ASPART 100 UNIT/ML ~~LOC~~ SOLN: 0.0000 [IU] | Freq: Every day | SUBCUTANEOUS | Status: DC

## 2018-02-25 MED ORDER — SODIUM CHLORIDE 0.9 % IV BOLUS
500.0000 mL | Freq: Once | INTRAVENOUS | Status: AC
  Administered 2018-02-25: 500 mL via INTRAVENOUS

## 2018-02-25 MED ORDER — MELATONIN 3 MG PO TABS
3.0000 mg | ORAL_TABLET | Freq: Every evening | ORAL | Status: DC | PRN
  Filled 2018-02-25: qty 1

## 2018-02-25 MED ORDER — ENOXAPARIN SODIUM 40 MG/0.4ML ~~LOC~~ SOLN
40.0000 mg | SUBCUTANEOUS | Status: DC
  Administered 2018-02-25: 40 mg via SUBCUTANEOUS
  Filled 2018-02-25: qty 0.4

## 2018-02-25 MED ORDER — MAGNESIUM OXIDE 400 (241.3 MG) MG PO TABS
200.0000 mg | ORAL_TABLET | Freq: Two times a day (BID) | ORAL | Status: DC
  Administered 2018-02-25 – 2018-02-26 (×2): 200 mg via ORAL
  Filled 2018-02-25 (×2): qty 1

## 2018-02-25 MED ORDER — IOHEXOL 300 MG/ML  SOLN
75.0000 mL | Freq: Once | INTRAMUSCULAR | Status: AC | PRN
  Administered 2018-02-25: 75 mL via INTRAVENOUS

## 2018-02-25 MED ORDER — SODIUM CHLORIDE 0.9 % IV SOLN
1.0000 g | INTRAVENOUS | Status: DC
  Administered 2018-02-25: 1 g via INTRAVENOUS
  Filled 2018-02-25: qty 10
  Filled 2018-02-25: qty 1

## 2018-02-25 MED ORDER — INSULIN ASPART 100 UNIT/ML ~~LOC~~ SOLN
0.0000 [IU] | Freq: Three times a day (TID) | SUBCUTANEOUS | Status: DC
  Administered 2018-02-26: 3 [IU] via SUBCUTANEOUS
  Administered 2018-02-26: 2 [IU] via SUBCUTANEOUS

## 2018-02-25 MED ORDER — SODIUM CHLORIDE 0.9 % IV SOLN
INTRAVENOUS | Status: AC
  Administered 2018-02-25: 22:00:00 via INTRAVENOUS

## 2018-02-25 MED ORDER — LEFLUNOMIDE 20 MG PO TABS
20.0000 mg | ORAL_TABLET | Freq: Every day | ORAL | Status: DC
  Administered 2018-02-26: 20 mg via ORAL
  Filled 2018-02-25: qty 1

## 2018-02-25 MED ORDER — FERROUS SULFATE 220 (44 FE) MG/5ML PO ELIX
660.0000 mg | ORAL_SOLUTION | Freq: Every day | ORAL | Status: DC
  Filled 2018-02-25: qty 15

## 2018-02-25 MED ORDER — SODIUM POLYSTYRENE SULFONATE 15 GM/60ML PO SUSP
15.0000 g | Freq: Once | ORAL | Status: AC
  Administered 2018-02-26: 15 g via RECTAL
  Filled 2018-02-25 (×3): qty 60

## 2018-02-25 NOTE — ED Notes (Signed)
ED TO INPATIENT HANDOFF REPORT  Name/Age/Gender [name] 84 y.o. female  Code Status    Code Status Orders  (From admission, onward)        Start     Ordered   02/25/18 2020  Full code  Continuous     02/25/18 2022    Code Status History    Date Active Date Inactive Code Status Order ID Comments User Context   12/05/2017 1344 12/07/2017 1547 Full Code [id number]  Reyne Dumas, MD ED   12/05/2017 1318 12/05/2017 1344 Full Code [id number]  Reyne Dumas, MD ED   11/29/2017 1137 11/30/2017 1859 Partial Code [id number]  Radene Gunning, NP ED   11/29/2017 1111 11/29/2017 1136 Full Code [id number]  Radene Gunning, NP ED   09/10/2017 2124 09/13/2017 1636 Full Code [id number]  Domenic Polite, MD ED   10/19/2012 2251 10/22/2012 1759 Full Code [id number]  Etta Quill., DO ED   08/30/2012 1032 09/01/2012 1833 Full Code [id number]  Delrae Sawyers, RN Inpatient   08/28/2012 0706 08/30/2012 1032 Full Code [id number]  Herb Grays, RN Inpatient      Home/SNF/Other Home  Chief Complaint AMS, UTI  Level of Care/Admitting Diagnosis ED Disposition    ED Disposition Condition Comment   [facility] [facility]: [facility] [[id number]]  Level of Care: Telemetry [5]  Admit to tele based on following criteria: Monitor for Ischemic changes  Diagnosis: Altered mental status [780.97.ICD-9-CM]  Admitting Physician: Jani Gravel [[id number]]  Attending Physician: Jani Gravel [[id number]]  PT Class (Do Not Modify): Observation [104]  PT Acc Code (Do Not Modify): Observation [10022]       Medical History Past Medical History:  Diagnosis Date  . Acute bronchitis   . Anemia, unspecified   . Atrial fibrillation ([place])   . Depressive disorder, not elsewhere classified   . Diverticulosis of colon (without mention of hemorrhage)   . Esophageal reflux   . Family history of malignant neoplasm of gastrointestinal tract   . Functional diarrhea   . Hiatal hernia   . History of blood  transfusion    "once; related to diverticulitis" (11/29/2017)  . Other and unspecified hyperlipidemia   . Other chest pain   . Other specified cardiac dysrhythmias(427.89)   . Personal history of colonic polyps 07/17/1995   adenomatous polyps  . PONV (postoperative nausea and vomiting)   . Recurrent UTI (urinary tract infection)    "2-3 times in the last 1 1/13yr (11/29/2017)  . Rheumatoid arthritis ([place]   . Tachycardia, unspecified   . Type II diabetes mellitus ([place]   . Unspecified adverse effect of unspecified drug, medicinal and biological substance   . Unspecified essential hypertension   . Unspecified hypothyroidism     Allergies No Known Allergies  IV Location/Drains/Wounds Patient Lines/Drains/Airways Status   Active Line/Drains/Airways    Name:   Placement date:   Placement time:   Site:   Days:   Peripheral IV 02/25/18 Left Hand   02/25/18    -    Hand   less than 1   Pressure Injury 11/29/17 Stage II -  Partial thickness loss of dermis presenting as a shallow open ulcer with a red, pink wound bed without slough. small blisterlike with no fluid    11/29/17    1219     88   Pressure Injury 11/29/17 Stage I -  Intact skin with non-blanchable redness of a localized area usually over a bony prominence.  11/29/17    1219     88   Pressure Injury 11/29/17 Stage I -  Intact skin with non-blanchable redness of a localized area usually over a bony prominence.   11/29/17    1219     88   Wound / Incision (Open or Dehisced) 11/29/17 Diabetic ulcer Leg Left   11/29/17    1219    Leg   88          Labs/Imaging Results for orders placed or performed during the hospital encounter of 02/25/18 (from the past 48 hour(s))  CBG monitoring, ED     Status: Abnormal   Collection Time: 02/25/18  3:26 PM  Result Value Ref Range   Glucose-Capillary 213 (H) 65 - 99 mg/dL  Ethanol     Status: None   Collection Time: 02/25/18  3:50 PM  Result Value Ref Range   Alcohol, Ethyl (B) <10 <10 mg/dL     Comment: (NOTE) Lowest detectable limit for serum alcohol is 10 mg/dL. For medical purposes only. Performed at [facility], [place] [address], [place], [place] [address]   Protime-INR     Status: None   Collection Time: 02/25/18  3:50 PM  Result Value Ref Range   Prothrombin Time 12.7 11.4 - 15.2 seconds   INR 0.96     Comment: Performed at [facility], [place] [address] [place], [place] [address]  APTT     Status: None   Collection Time: 02/25/18  3:50 PM  Result Value Ref Range   aPTT 25 24 - 36 seconds    Comment: Performed at [facility], [place] [address] [place], [place] [address]  CBC     Status: Abnormal   Collection Time: 02/25/18  3:50 PM  Result Value Ref Range   WBC 9.8 4.0 - 10.5 K/uL   RBC 4.61 3.87 - 5.11 MIL/uL   Hemoglobin 11.3 (L) 12.0 - 15.0 g/dL   HCT 37.2 36.0 - 46.0 %   MCV 80.7 78.0 - 100.0 fL   MCH 24.5 (L) 26.0 - 34.0 pg   MCHC 30.4 30.0 - 36.0 g/dL   RDW 17.1 (H) 11.5 - 15.5 %   Platelets 285 150 - 400 K/uL    Comment: Performed at [facility], [place] [address], [place], [place] [address]  Differential     Status: Abnormal   Collection Time: 02/25/18  3:50 PM  Result Value Ref Range   Neutrophils Relative % 87 %   Lymphocytes Relative 5 %   Monocytes Relative 7 %   Eosinophils Relative 1 %   Basophils Relative 0 %   Neutro Abs 8.5 (H) 1.7 - 7.7 K/uL   Lymphs Abs 0.5 (L) 0.7 - 4.0 K/uL   Monocytes Absolute 0.7 0.1 - 1.0 K/uL   Eosinophils Absolute 0.1 0.0 - 0.7 K/uL   Basophils Absolute 0.0 0.0 - 0.1 K/uL   RBC Morphology ELLIPTOCYTES    WBC Morphology INCREASED BANDS (>20% BANDS)     Comment: Performed at [facility], [place] [address], [place], [place] [address]  Comprehensive metabolic panel     Status: Abnormal   Collection Time: 02/25/18  3:50 PM  Result Value Ref Range   Sodium 140 135 - 145 mmol/L   Potassium 5.8 (H) 3.5 - 5.1 mmol/L   Chloride  105 101 - 111 mmol/L   CO2 26 22 - 32 mmol/L   Glucose, Bld 221 (H) 65 -  99 mg/dL   BUN 15 6 - 20 mg/dL   Creatinine, Ser 0.85 0.44 - 1.00 mg/dL   Calcium 8.6 (L) 8.9 - 10.3 mg/dL   Total Protein 6.5 6.5 - 8.1 g/dL   Albumin 3.4 (L) 3.5 - 5.0 g/dL   AST 58 (H) 15 - 41 U/L   ALT 13 (L) 14 - 54 U/L   Alkaline Phosphatase 60 38 - 126 U/L   Total Bilirubin 1.2 0.3 - 1.2 mg/dL   GFR calc non Af Amer 60 (L) >60 mL/min   GFR calc Af Amer >60 >60 mL/min    Comment: (NOTE) The eGFR has been calculated using the CKD EPI equation. This calculation has not been validated in all clinical situations. eGFR's persistently <60 mL/min signify possible Chronic Kidney Disease.    Anion gap 9 5 - 15    Comment: Performed at [facility], [place] [address] [place], [place] [address]  Urinalysis, Routine w reflex microscopic     Status: Abnormal   Collection Time: 02/25/18  3:50 PM  Result Value Ref Range   Color, Urine YELLOW YELLOW   APPearance CLEAR CLEAR   Specific Gravity, Urine 1.021 1.005 - 1.030   pH 5.0 5.0 - 8.0   Glucose, UA 50 (A) NEGATIVE mg/dL   Hgb urine dipstick NEGATIVE NEGATIVE   Bilirubin Urine NEGATIVE NEGATIVE   Ketones, ur 5 (A) NEGATIVE mg/dL   Protein, ur 30 (A) NEGATIVE mg/dL   Nitrite NEGATIVE NEGATIVE   Leukocytes, UA NEGATIVE NEGATIVE   RBC / HPF 0-5 0 - 5 RBC/hpf   WBC, UA 11-20 0 - 5 WBC/hpf   Bacteria, UA NONE SEEN NONE SEEN   Mucus PRESENT    Budding Yeast PRESENT    Hyaline Casts, UA PRESENT     Comment: Performed at [facility], [place] [address] [place], [place] [address]  I-stat troponin, ED     Status: None   Collection Time: 02/25/18  3:56 PM  Result Value Ref Range   Troponin i, poc 0.01 0.00 - 0.08 ng/mL   Comment 3            Comment: Due to the release kinetics of cTnI, a negative result within the first hours of the onset of symptoms does not rule out myocardial infarction with certainty. If myocardial  infarction is still suspected, repeat the test at appropriate intervals.   I-Stat Chem 8, ED     Status: Abnormal   Collection Time: 02/25/18  3:58 PM  Result Value Ref Range   Sodium 138 135 - 145 mmol/L   Potassium 5.5 (H) 3.5 - 5.1 mmol/L   Chloride 103 101 - 111 mmol/L   BUN 17 6 - 20 mg/dL   Creatinine, Ser 0.50 0.44 - 1.00 mg/dL   Glucose, Bld 219 (H) 65 - 99 mg/dL   Calcium, Ion 0.93 (L) 1.15 - 1.40 mmol/L   TCO2 27 22 - 32 mmol/L   Hemoglobin 12.2 12.0 - 15.0 g/dL   HCT 36.0 36.0 - 46.0 %   Ct Head Wo Contrast  Result Date: 02/25/2018 CLINICAL DATA:  84 year old female with acute ataxia and dizziness. EXAM: CT HEAD WITHOUT CONTRAST TECHNIQUE: Contiguous axial images were obtained from the base of the skull through the vertex without intravenous contrast. COMPARISON:  12/05/2017 and prior studies FINDINGS: Brain: No evidence of acute infarction, hemorrhage, hydrocephalus, extra-axial collection or mass lesion/mass effect. Atrophy and chronic small-vessel white matter ischemic changes are again noted. Vascular:  Atherosclerotic calcifications noted. Skull: Normal. Negative for fracture or focal lesion. Sinuses/Orbits: No acute finding. Other: None. IMPRESSION: 1. No evidence of acute intracranial abnormality 2. Atrophy and chronic small-vessel white matter ischemic changes. Electronically Signed   By: Margarette Canada M.D.   On: 02/25/2018 16:54   Ct Soft Tissue Neck W Contrast  Result Date: 02/25/2018 CLINICAL DATA:  Initial evaluation for dystonia. We went would you looking for EXAM: CT NECK WITH CONTRAST TECHNIQUE: Multidetector CT imaging of the neck was performed using the standard protocol following the bolus administration of intravenous contrast. CONTRAST:  2m OMNIPAQUE IOHEXOL 300 MG/ML  SOLN COMPARISON:  None. FINDINGS: Pharynx and larynx: Examination moderately degraded by motion artifact. Oral cavity grossly within normal limits without obvious mass or collection. Oropharynx and  nasopharynx grossly normal. Parapharyngeal fat maintained. No retropharyngeal swelling or collection. Epiglottis grossly normal. Remainder of the hypopharynx and supraglottic larynx demonstrates no obvious acute abnormality. Subglottic airway clear. Salivary glands: Salivary glands including the parotid and submandibular glands grossly within normal limits. Thyroid: Thyroid poorly evaluated due to motion. No obvious abnormality. Lymph nodes: No appreciable adenopathy identified on this motion degraded exam. Vascular: Opacification of the major arterial vasculature of the neck. Atherosclerotic change about the aortic arch and carotid bifurcations. Limited intracranial: Calcified atherosclerosis at the skull base. Suspected well-circumscribed lesion measuring 1.7 x 1.1 cm at the left middle cranial fossa, possibly meningioma (series 2, image 8). Visualized intracranial contents otherwise unremarkable. Visualized orbits: Globes and orbital soft tissues within normal limits. Patient status post lens extraction bilaterally. Mastoids and visualized paranasal sinuses: Paranasal sinuses are largely clear. Minimal mucosal thickening at the posterior right maxillary sinus. Visualized mastoids and middle ear cavities are clear. Skeleton: No obvious acute osseous abnormality on this motion degraded exam. No lytic or blastic osseous lesions. Patient is edentulous. Upper chest: Partially visualized upper chest within normal limits. Partially visualized lungs are largely clear. Other: None. IMPRESSION: 1. Limited exam due to extensive motion artifact. No definite acute abnormality identified. 2. Question 1.7 cm intracranial lesion at the left middle cranial fossa, which could reflect a small meningioma. No associated edema. 3. Moderate atherosclerotic change involving the major arterial vasculature of the head and neck. Electronically Signed   By: BJeannine BogaM.D.   On: 02/25/2018 19:03    Pending Labs Unresulted Labs  (From admission, onward)   Start     Ordered   03/04/18 0500  Creatinine, serum  (enoxaparin (LOVENOX)    CrCl >/= 30 ml/min)  Weekly,   R    Comments:  while on enoxaparin therapy    02/25/18 2022   02/26/18 0500  CBC  Tomorrow morning,   R     02/25/18 2022   02/26/18 0500  Comprehensive metabolic panel  Tomorrow morning,   R     02/25/18 2022   02/25/18 2023  Vitamin B12  Add-on,   R     02/25/18 2022   02/25/18 2023  Sedimentation rate  Add-on,   R     02/25/18 2022   02/25/18 2022  Urine culture  Add-on,   R     02/25/18 2022   02/25/18 2018  Culture, blood (routine x 2)  BLOOD CULTURE X 2,   R     02/25/18 2022   02/25/18 2015  ANA  Add-on,   R     02/25/18 2022   02/25/18 2015  RPR  Add-on,   R     02/25/18 2022  02/25/18 2015  TSH  Add-on,   R     02/25/18 2022      Vitals/Pain Today's Vitals   02/25/18 1611 02/25/18 1626 02/25/18 1630 02/25/18 1801  BP:   (!) 149/78 136/72  Pulse:   97 89  Resp:   (!) 21 17  Temp: (!) 97.3 F (36.3 C) (!) 97.3 F (36.3 C)    TempSrc:  Rectal    SpO2:   97% 99%  PainSc:        Isolation Precautions No active isolations  Medications Medications  enoxaparin (LOVENOX) injection 40 mg (has no administration in time range)  0.9 %  sodium chloride infusion (has no administration in time range)  cefTRIAXone (ROCEPHIN) 1 g in sodium chloride 0.9 % 100 mL IVPB (has no administration in time range)  insulin aspart (novoLOG) injection 0-9 Units (has no administration in time range)  insulin aspart (novoLOG) injection 0-5 Units (has no administration in time range)  sodium polystyrene (KAYEXALATE) 15 GM/60ML suspension 15 g (has no administration in time range)  sodium chloride 0.9 % bolus 500 mL (0 mLs Intravenous Stopped 02/25/18 1740)  LORazepam (ATIVAN) injection 0.5 mg (0.5 mg Intravenous Given 02/25/18 1757)  iohexol (OMNIPAQUE) 300 MG/ML solution 75 mL (75 mLs Intravenous Contrast Given 02/25/18 1814)     Mobility non-ambulatory

## 2018-02-25 NOTE — ED Notes (Signed)
Bed: WA17 Expected date:  Expected time:  Means of arrival:  Comments: EMS-UTI

## 2018-02-25 NOTE — ED Triage Notes (Signed)
Pt BIB [facility] from home. Home health called EMS. For slight hallucinations. Pt is CAOx4 however will see things from time to time. Pt reports that this happens when she starts developing a UTI. Pt reports no urinary symptoms.

## 2018-02-25 NOTE — ED Provider Notes (Signed)
[name] DEPT Provider Note   CSN: [id number] Arrival date & time: 02/25/18  1459     History   Chief Complaint Chief Complaint  Patient presents with  . Altered Mental Status  . Urinary Tract Infection    HPI [name] is a 84 y.o. female.  HPI  84 year old female presents with dizziness and visual complaints.  She has a history of type 2 diabetes, recurrent urinary tract infections, hypothyroidism, rheumatoid arthritis and vertigo.  She is chronically wheelchair-bound due to the arthritis.  Today while in the car she started to see buildings turn on their side and then go upside down.  She felt like she was falling.  She has had visual findings like this before that she attributes to past vertigo.  Often times she has a concomitant urinary tract infection.  She is not sure how long it lasted or what time it it exactly started but the symptoms are no longer present.  She states she has a mild residual frontal headache.  She denies any other pain including no chest pain or shortness of breath.  No abdominal pain.  Past Medical History:  Diagnosis Date  . Acute bronchitis   . Anemia, unspecified   . Atrial fibrillation ([place])   . Depressive disorder, not elsewhere classified   . Diverticulosis of colon (without mention of hemorrhage)   . Esophageal reflux   . Family history of malignant neoplasm of gastrointestinal tract   . Functional diarrhea   . Hiatal hernia   . History of blood transfusion    "once; related to diverticulitis" (11/29/2017)  . Other and unspecified hyperlipidemia   . Other chest pain   . Other specified cardiac dysrhythmias(427.89)   . Personal history of colonic polyps 07/17/1995   adenomatous polyps  . PONV (postoperative nausea and vomiting)   . Recurrent UTI (urinary tract infection)    "2-3 times in the last 1 1/24yr" (11/29/2017)  . Rheumatoid arthritis ([place])   . Tachycardia, unspecified   . Type II diabetes  mellitus ([place])   . Unspecified adverse effect of unspecified drug, medicinal and biological substance   . Unspecified essential hypertension   . Unspecified hypothyroidism     Patient Active Problem List   Diagnosis Date Noted  . Hyperkalemia 02/25/2018  . Altered mental status 02/25/2018  . Acute lower UTI 02/25/2018  . Iron deficiency anemia due to chronic blood loss 12/17/2017  . Protein-calorie malnutrition, severe 11/30/2017  . Syncope 11/29/2017  . Diabetic ulcer of left foot associated with type 2 diabetes mellitus ([place]) 08/11/2015  . Closed right hip fracture ([place]) 08/28/2012  . History of gastroesophageal reflux (GERD) 06/26/2011  . Lower GI bleeding 06/26/2011  . DEPRESSION 03/31/2009  . DIARRHEA, FUNCTIONAL 03/31/2009  . DIABETIC HYPOGLYCEMIA, TYPE II 11/03/2008  . ACUTE BRONCHITIS 08/23/2008  . AV NODAL REENTRY TACHYCARDIA 10/29/2007  . TACHYCARDIA 10/29/2007  . CHEST PAIN, ATYPICAL 10/25/2007  . ADVEF, DRUG/MEDICINAL/BIOLOGICAL SUBST NOS 07/16/2007  . Nonspecific (abnormal) findings on radiological and other examination of body structure 04/03/2007  . CHEST XRAY, ABNORMAL 04/03/2007  . Hypothyroidism 03/06/2007  . Type 2 diabetes mellitus with other specified complication ([place]) 56/38/7564  . HYPERLIPIDEMIA 03/06/2007  . Essential hypertension 03/06/2007  . DIVERTICULOSIS, COLON 03/06/2007  . Rheumatoid arthritis involving both hands with positive rheumatoid factor ([place]) 03/06/2007    Past Surgical History:  Procedure Laterality Date  . ABDOMINAL HYSTERECTOMY     "partial"  . CATARACT EXTRACTION Bilateral   .  FOREARM FRACTURE SURGERY Right 1940s?  . FRACTURE SURGERY    . ORIF FEMUR FRACTURE  08/30/2012   Procedure: OPEN REDUCTION INTERNAL FIXATION (ORIF) DISTAL FEMUR FRACTURE;  Surgeon: Mauri Pole, MD;  Location: [facility];  Service: Orthopedics;  Laterality: Right;  proximal femur  . THYROIDECTOMY, PARTIAL       OB History   None      Home  Medications    Prior to Admission medications   Medication Sig Start Date End Date Taking? Authorizing Provider  ferrous sulfate 220 (44 Fe) MG/5ML solution Take 660 mg by mouth daily.   Yes [provider]  leflunomide (ARAVA) 20 MG tablet Take 20 mg by mouth daily. 08/14/17  Yes [provider]  levothyroxine (SYNTHROID) 100 MCG tablet Take 1 tablet (100 mcg total) by mouth daily. 12/07/17 12/07/18 Yes Regalado, Belkys A, MD  magnesium oxide (MAG-OX) 400 (241.3 Mg) MG tablet Take 0.5 tablets (200 mg total) by mouth 2 (two) times daily. 12/07/17  Yes Regalado, Belkys A, MD  Melatonin 2.5 MG CAPS Take 1 capsule (2.5 mg total) by mouth at bedtime as needed (for sleep). 12/07/17  Yes Regalado, Belkys A, MD  metFORMIN (GLUCOPHAGE-XR) 500 MG 24 hr tablet Take 500-1,000 mg by mouth 2 (two) times daily. Take 1000 mg by mouth in the morning and 500 mg by mouth in the evening 03/07/15  Yes [provider]  mirtazapine (REMERON) 7.5 MG tablet Take 7.5 mg by mouth at bedtime. 02/03/18  Yes [provider]  omeprazole (PRILOSEC) 40 MG capsule Take 1 capsule (40 mg total) by mouth 2 (two) times daily. 12/07/17  Yes Regalado, Belkys A, MD  predniSONE (DELTASONE) 1 MG tablet Take 4 mg by mouth daily with breakfast.    Yes [provider]  TRADJENTA 5 MG TABS tablet Take 5 mg by mouth daily. 01/29/18  Yes [provider]  TRULICITY 6.76 HM/0.9OB SOPN Inject 0.5 mLs into the skin once a week.  12/17/17  Yes [provider]  VOLTAREN 1 % GEL Apply 2 g topically 4 (four) times daily.  11/30/13  Yes [provider]  doxycycline (VIBRA-TABS) 100 MG tablet Take 1 tablet (100 mg total) by mouth 2 (two) times daily. Patient not taking: Reported on 02/25/2018 12/09/17   Trula Slade, DPM  feeding supplement, ENSURE ENLIVE, (ENSURE ENLIVE) LIQD Take 237 mLs by mouth 2 (two) times daily between meals. Patient not taking: Reported on 02/25/2018 11/30/17   Purohit,  Konrad Dolores, MD  ferrous sulfate 325 (65 FE) MG EC tablet Take 1 tablet (325 mg total) by mouth 2 (two) times daily. Patient not taking: Reported on 02/25/2018 12/07/17 12/07/18  Elmarie Shiley, MD    Family History Family History  Problem Relation Age of Onset  . Colon cancer Maternal Grandmother     Social History Social History   Tobacco Use  . Smoking status: Never Smoker  . Smokeless tobacco: Never Used  Substance Use Topics  . Alcohol use: Yes    Alcohol/week: 0.6 oz    Types: 1 Glasses of wine per week  . Drug use: No     Allergies   Patient has no known allergies.   Review of Systems Review of Systems  Constitutional: Negative for fever.  Eyes: Positive for visual disturbance.  Respiratory: Negative for shortness of breath.   Gastrointestinal: Negative for abdominal pain and vomiting.  Genitourinary: Negative for dysuria.  Neurological: Positive for dizziness and headaches.  All other systems  reviewed and are negative.    Physical Exam Updated Vital Signs BP (!) 146/79 (BP Location: Left Arm)   Pulse 87   Temp 97.7 F (36.5 C) (Axillary)   Resp 17   SpO2 97%   Physical Exam  Constitutional: She appears well-developed and well-nourished. No distress.  Contracted Leaning to the right.  HENT:  Head: Normocephalic and atraumatic.  Right Ear: External ear normal.  Left Ear: External ear normal.  Nose: Nose normal.  Mouth/Throat: Mucous membranes are dry.  Eyes: EOM are normal. Right eye exhibits no discharge. Left eye exhibits no discharge.  Neck:  Neck rotated to right. Able to move a little to left but not fully  Cardiovascular: Normal rate and normal heart sounds. An irregular rhythm present.  Pulmonary/Chest: Effort normal and breath sounds normal.  Abdominal: Soft. There is no tenderness.  Neurological: She is alert.  Awake, alert, slightly off on day of week but knows it's 2019. Thinks it's May. Equal strength in all 4 extremities.   Skin: Skin  is warm and dry. She is not diaphoretic.  Nursing note and vitals reviewed.    ED Treatments / Results  Labs (all labs ordered are listed, but only abnormal results are displayed) Labs Reviewed  CBC - Abnormal; Notable for the following components:      Result Value   Hemoglobin 11.3 (*)    MCH 24.5 (*)    RDW 17.1 (*)    All other components within normal limits  DIFFERENTIAL - Abnormal; Notable for the following components:   Neutro Abs 8.5 (*)    Lymphs Abs 0.5 (*)    All other components within normal limits  COMPREHENSIVE METABOLIC PANEL - Abnormal; Notable for the following components:   Potassium 5.8 (*)    Glucose, Bld 221 (*)    Calcium 8.6 (*)    Albumin 3.4 (*)    AST 58 (*)    ALT 13 (*)    GFR calc non Af Amer 60 (*)    All other components within normal limits  URINALYSIS, ROUTINE W REFLEX MICROSCOPIC - Abnormal; Notable for the following components:   Glucose, UA 50 (*)    Ketones, ur 5 (*)    Protein, ur 30 (*)    All other components within normal limits  GLUCOSE, CAPILLARY - Abnormal; Notable for the following components:   Glucose-Capillary 118 (*)    All other components within normal limits  CBG MONITORING, ED - Abnormal; Notable for the following components:   Glucose-Capillary 213 (*)    All other components within normal limits  I-STAT CHEM 8, ED - Abnormal; Notable for the following components:   Potassium 5.5 (*)    Glucose, Bld 219 (*)    Calcium, Ion 0.93 (*)    All other components within normal limits  CULTURE, BLOOD (ROUTINE X 2)  CULTURE, BLOOD (ROUTINE X 2)  URINE CULTURE  ETHANOL  PROTIME-INR  APTT  VITAMIN B12  SEDIMENTATION RATE  TSH  ANA  RPR  CBC  COMPREHENSIVE METABOLIC PANEL  I-STAT TROPONIN, ED    EKG EKG Interpretation  Date/Time:  Tuesday February 25 2018 15:24:48 EDT Ventricular Rate:  96 PR Interval:    QRS Duration: 96 QT Interval:  368 QTC Calculation: 465 R Axis:   67 Text Interpretation:  sinus rhythm,  irregular LVH with secondary repolarization abnormality Anterior Q waves, possibly due to LVH Confirmed by Sherwood Gambler [phone]) on 02/25/2018 3:37:01 PM   Radiology Ct Head Wo  Contrast  Result Date: 02/25/2018 CLINICAL DATA:  84 year old female with acute ataxia and dizziness. EXAM: CT HEAD WITHOUT CONTRAST TECHNIQUE: Contiguous axial images were obtained from the base of the skull through the vertex without intravenous contrast. COMPARISON:  12/05/2017 and prior studies FINDINGS: Brain: No evidence of acute infarction, hemorrhage, hydrocephalus, extra-axial collection or mass lesion/mass effect. Atrophy and chronic small-vessel white matter ischemic changes are again noted. Vascular: Atherosclerotic calcifications noted. Skull: Normal. Negative for fracture or focal lesion. Sinuses/Orbits: No acute finding. Other: None. IMPRESSION: 1. No evidence of acute intracranial abnormality 2. Atrophy and chronic small-vessel white matter ischemic changes. Electronically Signed   By: Margarette Canada M.D.   On: 02/25/2018 16:54   Ct Soft Tissue Neck W Contrast  Result Date: 02/25/2018 CLINICAL DATA:  Initial evaluation for dystonia. We went would you looking for EXAM: CT NECK WITH CONTRAST TECHNIQUE: Multidetector CT imaging of the neck was performed using the standard protocol following the bolus administration of intravenous contrast. CONTRAST:  16mL OMNIPAQUE IOHEXOL 300 MG/ML  SOLN COMPARISON:  None. FINDINGS: Pharynx and larynx: Examination moderately degraded by motion artifact. Oral cavity grossly within normal limits without obvious mass or collection. Oropharynx and nasopharynx grossly normal. Parapharyngeal fat maintained. No retropharyngeal swelling or collection. Epiglottis grossly normal. Remainder of the hypopharynx and supraglottic larynx demonstrates no obvious acute abnormality. Subglottic airway clear. Salivary glands: Salivary glands including the parotid and submandibular glands grossly within normal  limits. Thyroid: Thyroid poorly evaluated due to motion. No obvious abnormality. Lymph nodes: No appreciable adenopathy identified on this motion degraded exam. Vascular: Opacification of the major arterial vasculature of the neck. Atherosclerotic change about the aortic arch and carotid bifurcations. Limited intracranial: Calcified atherosclerosis at the skull base. Suspected well-circumscribed lesion measuring 1.7 x 1.1 cm at the left middle cranial fossa, possibly meningioma (series 2, image 8). Visualized intracranial contents otherwise unremarkable. Visualized orbits: Globes and orbital soft tissues within normal limits. Patient status post lens extraction bilaterally. Mastoids and visualized paranasal sinuses: Paranasal sinuses are largely clear. Minimal mucosal thickening at the posterior right maxillary sinus. Visualized mastoids and middle ear cavities are clear. Skeleton: No obvious acute osseous abnormality on this motion degraded exam. No lytic or blastic osseous lesions. Patient is edentulous. Upper chest: Partially visualized upper chest within normal limits. Partially visualized lungs are largely clear. Other: None. IMPRESSION: 1. Limited exam due to extensive motion artifact. No definite acute abnormality identified. 2. Question 1.7 cm intracranial lesion at the left middle cranial fossa, which could reflect a small meningioma. No associated edema. 3. Moderate atherosclerotic change involving the major arterial vasculature of the head and neck. Electronically Signed   By: Jeannine Boga M.D.   On: 02/25/2018 19:03   Mr Brain Wo Contrast  Result Date: 02/25/2018 CLINICAL DATA:  Initial evaluation for acute altered mental status. EXAM: MRI HEAD WITHOUT CONTRAST TECHNIQUE: Multiplanar, multiecho pulse sequences of the brain and surrounding structures were obtained without intravenous contrast. COMPARISON:  Prior CT from earlier the same day. FINDINGS: Brain: Study moderately degraded by motion  artifact. Generalized age-related cerebral atrophy. Patchy and confluent T2/FLAIR hyperintensity within the periventricular and deep white matter both cerebral hemispheres, most consistent with chronic small vessel ischemic change, moderate nature. Chronic microvascular ischemic disease present within the pons as well. No abnormal foci of restricted diffusion to suggest acute or subacute ischemia. Gray-white matter differentiation maintained. No areas of remote or chronic infarction. No evidence for acute or chronic intracranial hemorrhage. No mass lesion, midline shift, or mass  effect. No hydrocephalus. No extra-axial fluid collection. Previously questioned lesion at the left middle cranial fossa of appears to possibly reflect a small encephalocele (series 12, image 19). No associated signal abnormality or other finding. Finding is likely incidental in nature and of doubtful clinical significance in the acute setting. Pituitary gland suprasellar region grossly normal. Vascular: Major intracranial vascular flow voids grossly maintained at the skull base. Skull and upper cervical spine: Craniocervical junction within normal limits. Upper cervical spine unremarkable. Bone marrow signal intensity normal. No scalp soft tissue abnormality. Sinuses/Orbits: Globes and orbital soft tissues grossly within normal limits. Scattered mucosal thickening within the ethmoidal air cells. Paranasal sinuses are otherwise clear. Small left mastoid effusion, likely benign. Other: None. IMPRESSION: 1. No acute intracranial abnormality. 2. Age-related cerebral atrophy with moderate chronic small vessel ischemic disease. 3. Previously questioned lesion at the left middle cranial fossa appears to possibly reflect a small focal encephalocele, not well assessed on this motion degraded exam, although felt to be almost certainly incidental in nature and of doubtful clinical significance. Electronically Signed   By: Jeannine Boga M.D.    On: 02/25/2018 21:11    Procedures Procedures (including critical care time)  Medications Ordered in ED Medications  enoxaparin (LOVENOX) injection 40 mg (40 mg Subcutaneous Given 02/25/18 2350)  0.9 %  sodium chloride infusion ( Intravenous New Bag/Given 02/25/18 2157)  cefTRIAXone (ROCEPHIN) 1 g in sodium chloride 0.9 % 100 mL IVPB (0 g Intravenous Stopped 02/25/18 2251)  insulin aspart (novoLOG) injection 0-9 Units (has no administration in time range)  insulin aspart (novoLOG) injection 0-5 Units (0 Units Subcutaneous Not Given 02/25/18 2341)  sodium polystyrene (KAYEXALATE) 15 GM/60ML suspension 15 g (has no administration in time range)  ferrous sulfate 220 (44 Fe) MG/5ML solution 660 mg (has no administration in time range)  leflunomide (ARAVA) tablet 20 mg (has no administration in time range)  levothyroxine (SYNTHROID, LEVOTHROID) tablet 100 mcg (has no administration in time range)  magnesium oxide (MAG-OX) tablet 200 mg (200 mg Oral Given 02/25/18 2352)  Melatonin TABS 3 mg (has no administration in time range)  pantoprazole (PROTONIX) EC tablet 40 mg (has no administration in time range)  predniSONE (DELTASONE) tablet 4 mg (has no administration in time range)  diclofenac sodium (VOLTAREN) 1 % transdermal gel 2 g (2 g Topical Given 02/25/18 2350)  sodium chloride 0.9 % bolus 500 mL (0 mLs Intravenous Stopped 02/25/18 1740)  LORazepam (ATIVAN) injection 0.5 mg (0.5 mg Intravenous Given 02/25/18 1757)  iohexol (OMNIPAQUE) 300 MG/ML solution 75 mL (75 mLs Intravenous Contrast Given 02/25/18 1814)     Initial Impression / Assessment and Plan / ED Course  I have reviewed the triage vital signs and the nursing notes.  Pertinent labs & imaging results that were available during my care of the patient were reviewed by me and considered in my medical decision making (see chart for details).     Patient is an overall difficult historian.  She has some mild hyperkalemia.  She seems to be leaning to  the right with some torticollis.  However she is not febrile and I think meningitis is less likely.  No obvious UTI.  She does not seem quite well enough to care for herself and while she does have help at home this seems to be a new finding for her.  I think she will need observation for further work-up.  Dr. Maudie Mercury to admit.  Final Clinical Impressions(s) / ED Diagnoses   Final diagnoses:  Abnormal liver function    ED Discharge Orders    None       Sherwood Gambler, MD 02/26/18 [phone]

## 2018-02-25 NOTE — H&P (Addendum)
TRH H&P   Patient Demographics:    [name], is a 84 y.o. female  MRN: [id number]   DOB - 03/11/1929  Admit Date - 02/25/2018  Outpatient Primary MD for the patient is Deland Pretty, MD  Referring MD/NP/PA:  Sherwood Gambler  Outpatient Specialists:     Patient coming from: home  Chief Complaint  Patient presents with  . Altered Mental Status  . Urinary Tract Infection      HPI:    [name]  is a 84 y.o. female,  w hypertension, dm2, hypothyroidism, rheumatoid arthritis, Pafib, Anemia, apparently presents with altered mental status with feeling of buildings turning on their side and then upside down.   ? Vertigo.  Pt notes slight frontal headache.  This history is garnered from ER notes due to fact patient is somnolent due to ativan that she was given in ED.   In ED,  CT brain, CT neck IMPRESSION: 1. Limited exam due to extensive motion artifact. No definite acute abnormality identified. 2. Question 1.7 cm intracranial lesion at the left middle cranial fossa, which could reflect a small meningioma. No associated edema. 3. Moderate atherosclerotic change involving the major arterial vasculature of the head and neck.  Na 138, K 5.5  Bun 17, Creatinine 0.50 Glucose 219 Ast 58, Alt 13, Alk phos 60, T. Bili 1.2 Alb 3.4  Wbc 9.8, hgb 11.3, Plt 285  Pt will be admitted for hyperkalemia, altered mental status, UTI      Review of systems:    In addition to the HPI above, unable to obtain properly due to somnolence.  No Fever-chills, No Headache, No changes with Vision or hearing, No problems swallowing food or Liquids, No Chest pain, Cough or Shortness of Breath, No Abdominal pain, No Nausea or Vommitting, Bowel movements are regular, No Blood in stool or Urine, No dysuria, No new skin rashes or bruises, No new joints pains-aches,  No new  weakness, tingling, numbness in any extremity, No recent weight gain or loss, No polyuria, polydypsia or polyphagia, No significant Mental Stressors.  A full 10 point Review of Systems was done, except as stated above, all other Review of Systems were negative.   With Past History of the following :    Past Medical History:  Diagnosis Date  . Acute bronchitis   . Anemia, unspecified   . Atrial fibrillation ([place])   . Depressive disorder, not elsewhere classified   . Diverticulosis of colon (without mention of hemorrhage)   . Esophageal reflux   . Family history of malignant neoplasm of gastrointestinal tract   . Functional diarrhea   . Hiatal hernia   . History of blood transfusion    "once; related to diverticulitis" (11/29/2017)  . Other and unspecified hyperlipidemia   . Other chest pain   . Other specified cardiac dysrhythmias(427.89)   . Personal history of colonic polyps 07/17/1995  adenomatous polyps  . PONV (postoperative nausea and vomiting)   . Recurrent UTI (urinary tract infection)    "2-3 times in the last 1 1/42yr (11/29/2017)  . Rheumatoid arthritis ([place]   . Tachycardia, unspecified   . Type II diabetes mellitus ([place]   . Unspecified adverse effect of unspecified drug, medicinal and biological substance   . Unspecified essential hypertension   . Unspecified hypothyroidism       Past Surgical History:  Procedure Laterality Date  . ABDOMINAL HYSTERECTOMY     "partial"  . CATARACT EXTRACTION Bilateral   . FOREARM FRACTURE SURGERY Right 1940s?  . FRACTURE SURGERY    . ORIF FEMUR FRACTURE  08/30/2012   Procedure: OPEN REDUCTION INTERNAL FIXATION (ORIF) DISTAL FEMUR FRACTURE;  Surgeon: MMauri Pole MD;  Location: [facility];  Service: Orthopedics;  Laterality: Right;  proximal femur  . THYROIDECTOMY, PARTIAL        Social History:     Social History   Tobacco Use  . Smoking status: Never Smoker  . Smokeless tobacco: Never Used  Substance Use Topics  .  Alcohol use: Yes    Alcohol/week: 0.6 oz    Types: 1 Glasses of wine per week     Lives - at home  Mobility - wheelchair bound   Family History :     Family History  Problem Relation Age of Onset  . Colon cancer Maternal Grandmother        Home Medications:   Prior to Admission medications   Medication Sig Start Date End Date Taking? Authorizing Provider  ferrous sulfate 220 (44 Fe) MG/5ML solution Take 660 mg by mouth daily.   Yes [provider]  leflunomide (ARAVA) 20 MG tablet Take 20 mg by mouth daily. 08/14/17  Yes [provider]  levothyroxine (SYNTHROID) 100 MCG tablet Take 1 tablet (100 mcg total) by mouth daily. 12/07/17 12/07/18 Yes Regalado, Belkys A, MD  magnesium oxide (MAG-OX) 400 (241.3 Mg) MG tablet Take 0.5 tablets (200 mg total) by mouth 2 (two) times daily. 12/07/17  Yes Regalado, Belkys A, MD  Melatonin 2.5 MG CAPS Take 1 capsule (2.5 mg total) by mouth at bedtime as needed (for sleep). 12/07/17  Yes Regalado, Belkys A, MD  metFORMIN (GLUCOPHAGE-XR) 500 MG 24 hr tablet Take 500-1,000 mg by mouth 2 (two) times daily. Take 1000 mg by mouth in the morning and 500 mg by mouth in the evening 03/07/15  Yes [provider]  mirtazapine (REMERON) 7.5 MG tablet Take 7.5 mg by mouth at bedtime. 02/03/18  Yes [provider]  omeprazole (PRILOSEC) 40 MG capsule Take 1 capsule (40 mg total) by mouth 2 (two) times daily. 12/07/17  Yes Regalado, Belkys A, MD  predniSONE (DELTASONE) 1 MG tablet Take 4 mg by mouth daily with breakfast.    Yes [provider]  TRADJENTA 5 MG TABS tablet Take 5 mg by mouth daily. 01/29/18  Yes [provider]  TRULICITY 07.16MRC/7.8LFSOPN Inject 0.5 mLs into the skin once a week.  12/17/17  Yes [provider]  VOLTAREN 1 % GEL Apply 2 g topically 4 (four) times daily.  11/30/13  Yes [provider]  doxycycline (VIBRA-TABS) 100 MG tablet Take 1 tablet (100 mg total) by mouth 2 (two)  times daily. Patient not taking: Reported on 02/25/2018 12/09/17   WTrula Slade DPM  feeding supplement, ENSURE ENLIVE, (ENSURE ENLIVE) LIQD Take 237 mLs by mouth 2 (two) times daily between meals. Patient  not taking: Reported on 02/25/2018 11/30/17   Purohit, Konrad Dolores, MD  ferrous sulfate 325 (65 FE) MG EC tablet Take 1 tablet (325 mg total) by mouth 2 (two) times daily. Patient not taking: Reported on 02/25/2018 12/07/17 12/07/18  Niel Hummer A, MD     Allergies:    No Known Allergies   Physical Exam:   Vitals  Blood pressure 136/72, pulse 89, temperature (!) 97.3 F (36.3 C), temperature source Rectal, resp. rate 17, SpO2 99 %.   1. General  lying in bed in NAD,    2. Normal affect and insight, Not Suicidal or Homicidal, Awake Alert, Oriented X 1.  3. No F.N deficits, ALL C.Nerves Intact, Strength 5/5 all 4 extremities, Sensation intact all 4 extremities, Plantars down going.  4. Ears and Eyes appear Normal, Conjunctivae clear, PERRLA. Moist Oral Mucosa.  5. Supple Neck, No JVD, No cervical lymphadenopathy appriciated, No Carotid Bruits.  6. Symmetrical Chest wall movement, Good air movement bilaterally, CTAB.  7. RRR, No Gallops, Rubs or Murmurs, No Parasternal Heave.  8. Positive Bowel Sounds, Abdomen Soft, No tenderness, No organomegaly appriciated,No rebound -guarding or rigidity.  9.  No Cyanosis, Normal Skin Turgor, No Skin Rash or Bruise.  10. Good muscle tone,  joints appear normal , no effusions, Normal ROM.  11. No Palpable Lymph Nodes in Neck or Axillae     Data Review:    CBC Recent Labs  Lab 02/25/18 1550 02/25/18 1558  WBC 9.8  --   HGB 11.3* 12.2  HCT 37.2 36.0  PLT 285  --   MCV 80.7  --   MCH 24.5*  --   MCHC 30.4  --   RDW 17.1*  --   LYMPHSABS 0.5*  --   MONOABS 0.7  --   EOSABS 0.1  --   BASOSABS 0.0  --     ------------------------------------------------------------------------------------------------------------------  Chemistries  Recent Labs  Lab 02/25/18 1550 02/25/18 1558  NA 140 138  K 5.8* 5.5*  CL 105 103  CO2 26  --   GLUCOSE 221* 219*  BUN 15 17  CREATININE 0.85 0.50  CALCIUM 8.6*  --   AST 58*  --   ALT 13*  --   ALKPHOS 60  --   BILITOT 1.2  --    ------------------------------------------------------------------------------------------------------------------ CrCl cannot be calculated (Unknown ideal weight.). ------------------------------------------------------------------------------------------------------------------ No results for input(s): TSH, T4TOTAL, T3FREE, THYROIDAB in the last 72 hours.  Invalid input(s): FREET3  Coagulation profile Recent Labs  Lab 02/25/18 1550  INR 0.96   ------------------------------------------------------------------------------------------------------------------- No results for input(s): DDIMER in the last 72 hours. -------------------------------------------------------------------------------------------------------------------  Cardiac Enzymes No results for input(s): CKMB, TROPONINI, MYOGLOBIN in the last 168 hours.  Invalid input(s): CK ------------------------------------------------------------------------------------------------------------------ No results found for: BNP   ---------------------------------------------------------------------------------------------------------------  Urinalysis    Component Value Date/Time   COLORURINE YELLOW 02/25/2018 [place] 02/25/2018 1550   LABSPEC 1.021 02/25/2018 1550   PHURINE 5.0 02/25/2018 1550   GLUCOSEU 50 (A) 02/25/2018 1550   HGBUR NEGATIVE 02/25/2018 1550   HGBUR negative 03/31/2009 1047   BILIRUBINUR NEGATIVE 02/25/2018 1550   KETONESUR 5 (A) 02/25/2018 1550   PROTEINUR 30 (A) 02/25/2018 1550   UROBILINOGEN 0.2 08/28/2012 0543    NITRITE NEGATIVE 02/25/2018 1550   LEUKOCYTESUR NEGATIVE 02/25/2018 1550    ----------------------------------------------------------------------------------------------------------------   Imaging Results:    Ct Head Wo Contrast  Result Date: 02/25/2018 CLINICAL DATA:  84 year old female with acute ataxia and dizziness. EXAM: CT HEAD WITHOUT  CONTRAST TECHNIQUE: Contiguous axial images were obtained from the base of the skull through the vertex without intravenous contrast. COMPARISON:  12/05/2017 and prior studies FINDINGS: Brain: No evidence of acute infarction, hemorrhage, hydrocephalus, extra-axial collection or mass lesion/mass effect. Atrophy and chronic small-vessel white matter ischemic changes are again noted. Vascular: Atherosclerotic calcifications noted. Skull: Normal. Negative for fracture or focal lesion. Sinuses/Orbits: No acute finding. Other: None. IMPRESSION: 1. No evidence of acute intracranial abnormality 2. Atrophy and chronic small-vessel white matter ischemic changes. Electronically Signed   By: Margarette Canada M.D.   On: 02/25/2018 16:54   Ct Soft Tissue Neck W Contrast  Result Date: 02/25/2018 CLINICAL DATA:  Initial evaluation for dystonia. We went would you looking for EXAM: CT NECK WITH CONTRAST TECHNIQUE: Multidetector CT imaging of the neck was performed using the standard protocol following the bolus administration of intravenous contrast. CONTRAST:  31m OMNIPAQUE IOHEXOL 300 MG/ML  SOLN COMPARISON:  None. FINDINGS: Pharynx and larynx: Examination moderately degraded by motion artifact. Oral cavity grossly within normal limits without obvious mass or collection. Oropharynx and nasopharynx grossly normal. Parapharyngeal fat maintained. No retropharyngeal swelling or collection. Epiglottis grossly normal. Remainder of the hypopharynx and supraglottic larynx demonstrates no obvious acute abnormality. Subglottic airway clear. Salivary glands: Salivary glands including the  parotid and submandibular glands grossly within normal limits. Thyroid: Thyroid poorly evaluated due to motion. No obvious abnormality. Lymph nodes: No appreciable adenopathy identified on this motion degraded exam. Vascular: Opacification of the major arterial vasculature of the neck. Atherosclerotic change about the aortic arch and carotid bifurcations. Limited intracranial: Calcified atherosclerosis at the skull base. Suspected well-circumscribed lesion measuring 1.7 x 1.1 cm at the left middle cranial fossa, possibly meningioma (series 2, image 8). Visualized intracranial contents otherwise unremarkable. Visualized orbits: Globes and orbital soft tissues within normal limits. Patient status post lens extraction bilaterally. Mastoids and visualized paranasal sinuses: Paranasal sinuses are largely clear. Minimal mucosal thickening at the posterior right maxillary sinus. Visualized mastoids and middle ear cavities are clear. Skeleton: No obvious acute osseous abnormality on this motion degraded exam. No lytic or blastic osseous lesions. Patient is edentulous. Upper chest: Partially visualized upper chest within normal limits. Partially visualized lungs are largely clear. Other: None. IMPRESSION: 1. Limited exam due to extensive motion artifact. No definite acute abnormality identified. 2. Question 1.7 cm intracranial lesion at the left middle cranial fossa, which could reflect a small meningioma. No associated edema. 3. Moderate atherosclerotic change involving the major arterial vasculature of the head and neck. Electronically Signed   By: BJeannine BogaM.D.   On: 02/25/2018 19:03        Assessment & Plan:    Principal Problem:   Acute lower UTI Active Problems:   Hypothyroidism   Type 2 diabetes mellitus with other specified complication (HCC)   Essential hypertension   Hyperkalemia   Altered mental status    AMS secondary to UTI ? MRI brain EEG b12 folate, esr, tsh, rpr  UTI Blood  culture x2 Urine culture Rocephin 1gm iv qday  Hyperkalemia Kayexalate pr x1 Check cmp in am  Abnormal LFT Acute hepatitis panel RUQ ultrasound  Dm2 HOLD metformin, HOLD Tradjenta, HOLD Trulicity fsbs ac and qhs, ISS  Hypothyroidism Check tsh Levothyroxine 100 micrograms po qday  Gerd Cont PPI  RA Cont Arava  Anemia Cont ferrous sulfate Check cbc in am      DVT Prophylaxis Lovenox - SCDs  AM Labs Ordered, also please review Full Orders  Family Communication: Admission,  patients condition and plan of care including tests being ordered have been discussed with the patient  who indicate understanding and agree with the plan and Code Status.  Code Status FULL CODE  Likely DC to  To be decided  Condition GUARDED    Consults called: none  Admission status: observation   Time spent in minutes : 45   Jani Gravel M.D on 02/25/2018 at 7:32 PM  Between 7am to 7pm - Pager - [phone]  . After 7pm go to [email or web address] - password [facility]  [facility] - Office  [phone]

## 2018-02-25 NOTE — ED Notes (Signed)
Pt is CAOx4 and is able to hold a conversation. She stated that she felt the room was upside down at one time earlier today, she does have a hx of vertigo. She then stated she saw men in her woods that were not there and decided she needed to have her urine checked for a UTI.

## 2018-02-25 NOTE — ED Notes (Signed)
Pt currently in MRI will transport upstairs when pt returns.

## 2018-02-26 ENCOUNTER — Other Ambulatory Visit: Payer: Self-pay

## 2018-02-26 ENCOUNTER — Observation Stay (HOSPITAL_COMMUNITY): Payer: Medicare Other

## 2018-02-26 DIAGNOSIS — N390 Urinary tract infection, site not specified: Secondary | ICD-10-CM | POA: Diagnosis not present

## 2018-02-26 DIAGNOSIS — L8990 Pressure ulcer of unspecified site, unspecified stage: Secondary | ICD-10-CM

## 2018-02-26 DIAGNOSIS — R4182 Altered mental status, unspecified: Secondary | ICD-10-CM | POA: Diagnosis not present

## 2018-02-26 LAB — COMPREHENSIVE METABOLIC PANEL
ALK PHOS: 63 U/L (ref 38–126)
ALT: 11 U/L — ABNORMAL LOW (ref 14–54)
ANION GAP: 10 (ref 5–15)
AST: 14 U/L — ABNORMAL LOW (ref 15–41)
Albumin: 3 g/dL — ABNORMAL LOW (ref 3.5–5.0)
BUN: 11 mg/dL (ref 6–20)
CO2: 27 mmol/L (ref 22–32)
Calcium: 8.2 mg/dL — ABNORMAL LOW (ref 8.9–10.3)
Chloride: 105 mmol/L (ref 101–111)
Creatinine, Ser: 0.44 mg/dL (ref 0.44–1.00)
GFR calc non Af Amer: >60 mL/min (ref >60)
Glucose, Bld: 87 mg/dL (ref 65–99)
Potassium: 3.4 mmol/L — ABNORMAL LOW (ref 3.5–5.1)
SODIUM: 142 mmol/L (ref 135–145)
Total Bilirubin: 0.3 mg/dL (ref 0.3–1.2)
Total Protein: 5.8 g/dL — ABNORMAL LOW (ref 6.5–8.1)

## 2018-02-26 LAB — CBC
HCT: 34.6 % — ABNORMAL LOW (ref 36.0–46.0)
HEMOGLOBIN: 10.5 g/dL — AB (ref 12.0–15.0)
MCH: 24.4 pg — AB (ref 26.0–34.0)
MCHC: 30.3 g/dL (ref 30.0–36.0)
MCV: 80.3 fL (ref 78.0–100.0)
PLATELETS: 273 10*3/uL (ref 150–400)
RBC: 4.31 MIL/uL (ref 3.87–5.11)
RDW: 17 % — AB (ref 11.5–15.5)
WBC: 7.1 10*3/uL (ref 4.0–10.5)

## 2018-02-26 LAB — RPR: RPR Ser Ql: NONREACTIVE

## 2018-02-26 LAB — GLUCOSE, CAPILLARY
Glucose-Capillary: 92 mg/dL (ref 65–99)
Glucose-Capillary: 207 mg/dL — ABNORMAL HIGH (ref 65–99)
Glucose-Capillary: 182 mg/dL — ABNORMAL HIGH (ref 65–99)

## 2018-02-26 MED ORDER — ENSURE ENLIVE PO LIQD: 237.0000 mL | Freq: Two times a day (BID) | ORAL | 0 refills | Status: AC

## 2018-02-26 MED ORDER — FERROUS SULFATE 300 (60 FE) MG/5ML PO SYRP
300.0000 mg | ORAL_SOLUTION | Freq: Two times a day (BID) | ORAL | Status: DC
  Administered 2018-02-26: 300 mg via ORAL
  Filled 2018-02-26 (×2): qty 5

## 2018-02-26 MED ORDER — MUPIROCIN CALCIUM 2 % EX CREA
TOPICAL_CREAM | Freq: Every day | CUTANEOUS | Status: DC
  Administered 2018-02-26: 10:00:00 via TOPICAL
  Filled 2018-02-26: qty 15

## 2018-02-26 MED ORDER — CEPHALEXIN 500 MG PO CAPS: 500.0000 mg | ORAL_CAPSULE | Freq: Two times a day (BID) | ORAL | 0 refills | Status: AC

## 2018-02-26 NOTE — Care Management Note (Signed)
Case Management Note  Patient Details  Name: [name] MRN: [id number] Date of Birth: 08/24/1929  Subjective/Objective: PT recc HHPT. MD ordered HHRN/PT/OT/aide-[facility] rep Janett Billow aware of [place] orders, & d/c. No further CM needs.                   Action/Plan:d/c home w/HHC.   Expected Discharge Date:  02/26/18               Expected Discharge Plan:  [place]  In-House Referral:     Discharge planning Services  CM Consult  Post Acute Care Choice:  Resumption of Svcs/PTA Provider([facility] caregivers-8-3p;6-9p) Choice offered to:  Patient  DME Arranged:    DME Agency:     HH Arranged:  RN, PT, OT, Nurse's Aide [place] Agency:  [place]  Status of Service:  Completed, signed off  If discussed at [place] of Stay Meetings, dates discussed:    Additional Comments:  Dessa Phi, RN 02/26/2018, 4:10 PM

## 2018-02-26 NOTE — Care Management Note (Signed)
Case Management Note  Patient Details  Name: [name] MRN: [id number] Date of Birth: 10-08-1928  Subjective/Objective: 84 y/o f admitted w/Acute lower UTI. From home. Has 24hr caregivers. Noted son is legal guardian-lives out of state. PTAR for ambulance transp-forms on shadow chart for nsg to call when ready. Also noted that caregiver will be @ home 3-11p shft so PTAR to arrive during that time-nsg will manage PTAR. No further CM needs.                   Action/Plan:d/c home/PTAR.   Expected Discharge Date:                  Expected Discharge Plan:  Home/Self Care  In-House Referral:     Discharge planning Services     Post Acute Care Choice:  Resumption of Svcs/PTA Provider(24 hr caregivers) Choice offered to:     DME Arranged:    DME Agency:     HH Arranged:    HH Agency:     Status of Service:  Completed, signed off  If discussed at [employer] of Stay Meetings, dates discussed:    Additional Comments:  Dessa Phi, RN 02/26/2018, 12:42 PM

## 2018-02-26 NOTE — Evaluation (Signed)
Physical Therapy Evaluation Patient Details Name: [name] MRN: [id number] DOB: [birth year] Today's Date: 02/26/2018   History of Present Illness  [name]  is a 84 y.o. female,  w hypertension, dm2, hypothyroidism, rheumatoid arthritis, Pafib, Anemia, apparently presents with altered mental status with feeling of buildings turning on their side and then upside down.   Clinical Impression  Patient presents with decreased mobility as compared to her baseline.  Usually able to transfer to chair on her own for toileting when caregivers not there.  Today needs min A for safety with transfers and increased time.  Feel she will benefit from HHPT at d/c.  Will not follow acutely as pt to d/c home today.    Follow Up Recommendations Home health PT;Supervision for mobility/OOB    Equipment Recommendations  None recommended by PT    Recommendations for Other Services       Precautions / Restrictions Precautions Precautions: Fall      Mobility  Bed Mobility Overal bed mobility: Needs Assistance Bed Mobility: Supine to Sit;Sit to Supine     Supine to sit: Min guard;HOB elevated Sit to supine: Supervision   General bed mobility comments: cues for positioning, increased time and multiple attempts, minguard to prevent falling back after pushing up heavily with hands  Transfers Overall transfer level: Needs assistance   Transfers: Squat Pivot Transfers     Squat pivot transfers: Min assist     General transfer comment: increased time and attempts for bed<>BSC with pt reporting weak from being in bed a few days and set up different from hers at home  Ambulation/Gait                Stairs            Wheelchair Mobility    Modified Rankin (Stroke Patients Only)       Balance Overall balance assessment: Needs assistance Sitting-balance support: Feet supported Sitting balance-Leahy Scale: Fair Sitting balance - Comments: needs cues to scoot back  on bed for safety                                     Pertinent Vitals/Pain Pain Assessment: Faces Faces Pain Scale: Hurts a little bit Pain Location: bruises on legs from w/c Pain Descriptors / Indicators: Sore;Aching Pain Intervention(s): Monitored during session    Home Living Family/patient expects to be discharged to:: Private residence Living Arrangements: Alone Available Help at Discharge: Personal care attendant(aides 8-3 & 6-9) Type of Home: House Home Access: Stairs to enter Entrance Stairs-Rails: None Entrance Stairs-Number of Steps: 2 Home Layout: One level Home Equipment: Walker - 2 wheels;Wheelchair - manual      Prior Function Level of Independence: Needs assistance   Gait / Transfers Assistance Needed: transfers to w/c independent and to bathroom, but w/c barely fits into bathroom per her report  ADL's / Homemaking Assistance Needed: requires assist, severe RA in hands        Hand Dominance   Dominant Hand: Right    Extremity/Trunk Assessment   Upper Extremity Assessment Upper Extremity Assessment: RUE deficits/detail;LUE deficits/detail RUE Deficits / Details: severe deformities in hands and fingers from RA, pads on elbows, but uses functionally for transfers, bed mobility and toileting LUE Deficits / Details: severe deformities in hands and fingers from RA, pads on elbows, but uses functionally for transfers, bed mobility and toileting    Lower Extremity Assessment Lower  Extremity Assessment: RLE deficits/detail;LLE deficits/detail RLE Deficits / Details: functional weakness relying heavily on UE's for transfers and balance, note R knee possible flexion contracture vs joint deformity LLE Deficits / Details: functional weakness relying heavily on UE's for transfers and balance    Cervical / Trunk Assessment Cervical / Trunk Assessment: Kyphotic  Communication   Communication: No difficulties  Cognition Arousal/Alertness:  Awake/alert Behavior During Therapy: WFL for tasks assessed/performed Overall Cognitive Status: No family/caregiver present to determine baseline cognitive functioning                                 General Comments: anxious about getting home today to be with her dog      General Comments      Exercises     Assessment/Plan    PT Assessment All further PT needs can be met in the next venue of care  PT Problem List         PT Treatment Interventions      PT Goals (Current goals can be found in the Care Plan section)  Acute Rehab PT Goals PT Goal Formulation: All assessment and education complete, DC therapy    Frequency     Barriers to discharge        Co-evaluation               AM-PAC PT "6 Clicks" Daily Activity  Outcome Measure Difficulty turning over in bed (including adjusting bedclothes, sheets and blankets)?: A Lot Difficulty moving from lying on back to sitting on the side of the bed? : A Lot Difficulty sitting down on and standing up from a chair with arms (e.g., wheelchair, bedside commode, etc,.)?: Unable Help needed moving to and from a bed to chair (including a wheelchair)?: A Little Help needed walking in hospital room?: Total Help needed climbing 3-5 steps with a railing? : Total 6 Click Score: 10    End of Session   Activity Tolerance: Patient limited by fatigue Patient left: in bed;with call bell/phone within reach;with bed alarm set   PT Visit Diagnosis: Other abnormalities of gait and mobility (R26.89);Muscle weakness (generalized) (M62.81)    Time: 4944-9675 PT Time Calculation (min) (ACUTE ONLY): 46 min   Charges:   PT Evaluation $PT Eval Moderate Complexity: 1 Mod PT Treatments $Therapeutic Activity: 8-22 mins $Neuromuscular Re-education: 8-22 mins   PT G CodesMagda Ryan, [place] [phone] 02/26/2018   [name] 02/26/2018, 3:58 PM

## 2018-02-26 NOTE — Care Management Note (Signed)
Case Management Note  Patient Details  Name: [name] MRN: [id number] Date of Birth: 09-18-1929  Subjective/Objective: TC SON [phone]-[place]-calls are forwarded-left message about d/c plans. PT cons-awaiting recc. Patient has used [facility] in past rep Janett Billow following if HHPT needed. [facility] [phone] rep stated they can have transp to patients home if needed-I informed rep to call back @ 4:30p to talk directly to the nurse for a better timeframe of d/c. Patient has home key in her black bag.                  Action/Plan:d/c plan home.   Expected Discharge Date:                  Expected Discharge Plan:  Home/Self Care  In-House Referral:     Discharge planning Services     Post Acute Care Choice:  Resumption of Svcs/PTA Provider([facility] caregivers-8-3p;6-9p) Choice offered to:     DME Arranged:    DME Agency:     HH Arranged:    HH Agency:     Status of Service:  In process, will continue to follow  If discussed at [place] of Stay Meetings, dates discussed:    Additional Comments:  Dessa Phi, RN 02/26/2018, 3:12 PM

## 2018-02-26 NOTE — Progress Notes (Signed)
Spoke to [employer] agrees to d/c home w/[facility] for transp-patient has key in black fanny pack. PT await recc. Patient has used [facility] in past-rep aware & following if needed.

## 2018-02-26 NOTE — Consult Note (Addendum)
[facility] Nurse wound consult note Reason for Consult: Consult requested for bilat ears and left foot.  Pt states she is followed by a wound care center prior to admission for left foot.  They have been applying Aquacel.  Wound type: Left foot with full thickness wound; .8X.8X.1cm, red and moist, no odor or drainage Right center ear with erythremia, 1X1cm area does not blanch Left center ear with full thickness wound; unusual appearance and unknown etiology; 1X1X.2cm, circular and raised above skin level, red dry center.  No odor or drainage.  Location and appearance are NOT consistent with a pressure injury.  Recommend assessment by dermatologist after discharge; we do not have one in the [facility] inpatient setting.   Dressing procedure/placement/frequency: Bactroban to left ear and may remain open to air.  Continue present plan of care to left foot with Aquacel to provide antimicrobial benefits and absorb drainage and foam dressing to protect from further injury.  Pt can resume follow-up with the wound care center after discharge. Discussed plan of care with patient and bedside nurse talked to the family via phone call. Please re-consult if further assistance is needed.  Thank-you,  Julien Girt MSN, [place], [place], [place], [place]

## 2018-02-26 NOTE — Progress Notes (Signed)
Tried to notify patient's son, who is the patient's Legal Guardian, that the patient was being discharged home with HHPT and her caregivers from [facility] that she's had during the day. Tried to call twice and was forwarded to voicemail. Case Management tried to call as well and was forwarded to voicemail too. Patient's caregiver from [facility] has taken patient home. RN went over discharge orders with patient and patient caregiver before d/c.  Carmela Hurt, RN

## 2018-02-26 NOTE — Progress Notes (Signed)
Noted per-caregiver in rm-the hours of service patient receiving is 8a-3p; then 6-9p. Nsg will arrange PTAR during times caregiver will be @ home-will confirm exactly where the house key will be located for entrance into the home. Nsg has tried to reach Legal guardian (Son) but no response yet.

## 2018-02-26 NOTE — Progress Notes (Signed)
Initial Nutrition Assessment  DOCUMENTATION CODES:   Severe malnutrition in context of chronic illness, Underweight  INTERVENTION:  - If pt unable to d/c today, RD will order nutrition supplements.  NUTRITION DIAGNOSIS:   Severe Malnutrition related to chronic illness(advanced age, RA) as evidenced by severe fat depletion, severe muscle depletion.  GOAL:   Patient will meet greater than or equal to 90% of their needs  MONITOR:   PO intake, Weight trends, Labs, Skin  REASON FOR ASSESSMENT:   Malnutrition Screening Tool  ASSESSMENT:   84 y.o. female with PMH of HTN, Type 2 DM, hypothyroidism, rheumatoid arthritis, afib, and anemia. She presented to the ED with AMS and feeling that buildings were turning on their side and then upside down. Question vertigo. Pt notes slight frontal headache.   No intakes documented. Patient noted to be a/o to self and place and confusion was noticeable during RD visit although some information sounded appropriate. She reported drinking orange juice with morning medications and having coffee, scrambled eggs (a few bites), and most of a bowl of oat meal for breakfast. She reported that lunch came soon after breakfast so she was not able to eat as much as she may have liked. She reported lunch was a salad, pasta with meatsauce, and peaches. She recalls eating peaches often over the past 1 week .  Patient denies abdominal pain or discomfort of any kind at this time. NFPE outlined below. Per chart review, pt has gained weight since 09/11/17 when she weighed ~86 lbs. She has lost 7 lbs (7% body weight) compared to weight on 07/24/17; this is not traditionally significant for time frame but is significant for this patient given underweight BMI and prominent muscle and fat wasting.   Patient is supposed to d/c today. Will monitor for needs if unable to d/c.     Medications reviewed; 300 mg ferrous sulfate BID, sliding scale Novolog, 100 mcg oral  Synthroid/day, 200 mg Mag-ox BID, 40 mg oral Protonix/day, 4 mg Deltasone/day, 15 mL rectal Kayexalate x1 last night. Labs reviewed; CBGs: 92 and 207 mg/dL today, K: 3.4 mmol/L, Ca: 8.2 mg/dL.     NUTRITION - FOCUSED PHYSICAL EXAM:    Most Recent Value  Orbital Region  Moderate depletion  Upper Arm Region  Severe depletion  Thoracic and Lumbar Region  Unable to assess  Buccal Region  Moderate depletion  Temple Region  Severe depletion  Clavicle Bone Region  Severe depletion  Clavicle and Acromion Bone Region  Severe depletion  Scapular Bone Region  Unable to assess  Dorsal Hand  Unable to assess [d/t severe arthritis]  Patellar Region  Severe depletion  Anterior Thigh Region  Severe depletion  Posterior Calf Region  Severe depletion  Edema (RD Assessment)  None  Hair  Reviewed  Eyes  Reviewed  Mouth  Reviewed [dentures in place]  Skin  Reviewed  Nails  Reviewed       Diet Order:   Diet Order           Diet heart healthy/carb modified Room service appropriate? Yes; Fluid consistency: Thin  Diet effective now          EDUCATION NEEDS:   No education needs have been identified at this time  Skin:  Skin Assessment: Skin Integrity Issues: Skin Integrity Issues:: Stage I, Stage IV Stage I: R ear Stage IV: L ear  Last BM:  6/5  Height:   Ht Readings from Last 1 Encounters:  02/25/18 5\' 5"  (1.651 m)  Weight:   Wt Readings from Last 1 Encounters:  12/24/17 95 lb 8 oz (43.3 kg)    Ideal Body Weight:  56.82 kg  BMI:  Body mass index is 15.89 kg/m.  Estimated Nutritional Needs:   Kcal:  1300-1515 (30-35 kcal/kg)  Protein:  52-65 grams (1.2-1.5 grams/kg)  Fluid:  >/= 1.5 L/day      Jarome Matin, MS, RD, LDN, [facility] Inpatient Clinical Dietitian Pager # [phone] After hours/weekend pager # [phone]

## 2018-02-27 DIAGNOSIS — E11621 Type 2 diabetes mellitus with foot ulcer: Secondary | ICD-10-CM | POA: Diagnosis not present

## 2018-02-27 DIAGNOSIS — D649 Anemia, unspecified: Secondary | ICD-10-CM | POA: Diagnosis not present

## 2018-02-27 DIAGNOSIS — L97522 Non-pressure chronic ulcer of other part of left foot with fat layer exposed: Secondary | ICD-10-CM | POA: Diagnosis not present

## 2018-02-27 DIAGNOSIS — M069 Rheumatoid arthritis, unspecified: Secondary | ICD-10-CM | POA: Diagnosis not present

## 2018-02-27 DIAGNOSIS — K5790 Diverticulosis of intestine, part unspecified, without perforation or abscess without bleeding: Secondary | ICD-10-CM | POA: Diagnosis not present

## 2018-02-27 DIAGNOSIS — M160 Bilateral primary osteoarthritis of hip: Secondary | ICD-10-CM | POA: Diagnosis not present

## 2018-02-27 LAB — URINE CULTURE: Culture: NO GROWTH

## 2018-02-27 LAB — ANA: Anti Nuclear Antibody(ANA): NEGATIVE

## 2018-02-28 ENCOUNTER — Encounter (HOSPITAL_BASED_OUTPATIENT_CLINIC_OR_DEPARTMENT_OTHER): Payer: Medicare Other | Attending: Internal Medicine

## 2018-02-28 DIAGNOSIS — I10 Essential (primary) hypertension: Secondary | ICD-10-CM | POA: Insufficient documentation

## 2018-02-28 DIAGNOSIS — M069 Rheumatoid arthritis, unspecified: Secondary | ICD-10-CM | POA: Diagnosis not present

## 2018-02-28 DIAGNOSIS — L97522 Non-pressure chronic ulcer of other part of left foot with fat layer exposed: Secondary | ICD-10-CM | POA: Diagnosis not present

## 2018-02-28 DIAGNOSIS — M21962 Unspecified acquired deformity of left lower leg: Secondary | ICD-10-CM | POA: Diagnosis not present

## 2018-02-28 DIAGNOSIS — E11621 Type 2 diabetes mellitus with foot ulcer: Secondary | ICD-10-CM | POA: Diagnosis not present

## 2018-02-28 DIAGNOSIS — E1142 Type 2 diabetes mellitus with diabetic polyneuropathy: Secondary | ICD-10-CM | POA: Insufficient documentation

## 2018-03-02 NOTE — Discharge Summary (Signed)
[facility] Discharge Summary   Patient: [name] DDU:[id number]   PCP: Deland Pretty, MD DOB: [birth year]   Date of admission: 02/25/2018   Date of discharge: 02/26/2018    Discharge Diagnoses:  Principal Problem:   Acute lower UTI Active Problems:   Hypothyroidism   Type 2 diabetes mellitus with other specified complication ([place])   Essential hypertension   Hyperkalemia   Altered mental status   Pressure injury of skin   Admitted From: home Disposition:  Home with home health  Recommendations for Outpatient Follow-up:  1. Follow-up with PCP in 1 week  Follow-up Information    Deland Pretty, MD. Schedule an appointment as soon as possible for a visit in 1 week(s).   Specialty:  Internal Medicine Contact information: [address] [place] [address] [phone]        [facility], [facility] Follow up.   Why:  HH nursing/physical therapy/occupational therapy Contact information: [place] [address] [phone]          Diet recommendation: Regular diet  Activity: The patient is advised to gradually reintroduce usual activities.  Discharge Condition: good  Code Status: Full code  History of present illness: As per the H and P dictated on admission, "[name]  is a 84 y.o. female,  w hypertension, dm2, hypothyroidism, rheumatoid arthritis, Pafib, Anemia, apparently presents with altered mental status with feeling of buildings turning on their side and then upside down.   ? Vertigo.  Pt notes slight frontal headache.  This history is garnered from ER notes due to fact patient is somnolent due to ativan that she was given in ED.   In ED,  CT brain, CT neck IMPRESSION: 1. Limited exam due to extensive motion artifact. No definite acute abnormality identified. 2. Question 1.7 cm intracranial lesion at the left middle cranial fossa, which could reflect a small meningioma. No associated edema. 3.  Moderate atherosclerotic change involving the major arterial vasculature of the head and neck.  Na 138, K 5.5  Bun 17, Creatinine 0.50 Glucose 219 Ast 58, Alt 13, Alk phos 60, T. Bili 1.2 Alb 3.4  Wbc 9.8, hgb 11.3, Plt 285  Pt will be admitted for hyperkalemia, altered mental status, UTI"  Hospital Course:  Summary of her active problems in the hospital is as following. Principal Problem:   Acute lower UTI Urine culture negative for any growth. With patient's presentation patient was given oral Keflex for short course to complete the treatment for UTI.  Active Problems:   Hypothyroidism Continue Synthroid   Type 2 diabetes mellitus with other specified complication (HCC) Blood sugar remained stable.  Monitor and resume home regimen.    Essential hypertension Continue home blood pressure medications.    Hyperkalemia Potassium was mildly elevated, repeat potassium remains stable. Monitor outpatient.    Altered mental status Primary reason for patient's presentation was confusion. Thought to be secondary to UTI and dehydration. Improved with IV hydration as well as treatment for UTI. Continue short course of oral Keflex on discharge and recommend oral hydration.  Patient had a wound care consult. Follows up with wound care as an outpatient for her chronic wounds. Not a pressure injury. Patient will continue outpatient follow-up with wound care.  All other chronic medical condition were stable during the hospitalization.  Patient was seen by physical therapy, who recommended home health, which was arranged by Education officer, museum and case Freight forwarder. On the day of the discharge the patient's vitals  were stable , and no other acute medical condition were reported by patient. the patient was felt safe to be discharge at home with home health.  Consultants: none Procedures: none  DISCHARGE MEDICATION: Allergies as of 02/26/2018   No Known Allergies     Medication List      STOP taking these medications   doxycycline 100 MG tablet Commonly known as:  VIBRA-TABS     TAKE these medications   cephALEXin 500 MG capsule Commonly known as:  KEFLEX Take 1 capsule (500 mg total) by mouth 2 (two) times daily for 7 days.   feeding supplement (ENSURE ENLIVE) Liqd Take 237 mLs by mouth 2 (two) times daily between meals.   ferrous sulfate 325 (65 FE) MG EC tablet Take 1 tablet (325 mg total) by mouth 2 (two) times daily. What changed:  Another medication with the same name was removed. Continue taking this medication, and follow the directions you see here.   leflunomide 20 MG tablet Commonly known as:  ARAVA Take 20 mg by mouth daily.   levothyroxine 100 MCG tablet Commonly known as:  SYNTHROID Take 1 tablet (100 mcg total) by mouth daily.   magnesium oxide 400 (241.3 Mg) MG tablet Commonly known as:  MAG-OX Take 0.5 tablets (200 mg total) by mouth 2 (two) times daily.   Melatonin 2.5 MG Caps Take 1 capsule (2.5 mg total) by mouth at bedtime as needed (for sleep).   metFORMIN 500 MG 24 hr tablet Commonly known as:  GLUCOPHAGE-XR Take 500-1,000 mg by mouth 2 (two) times daily. Take 1000 mg by mouth in the morning and 500 mg by mouth in the evening   mirtazapine 7.5 MG tablet Commonly known as:  REMERON Take 7.5 mg by mouth at bedtime.   omeprazole 40 MG capsule Commonly known as:  PRILOSEC Take 1 capsule (40 mg total) by mouth 2 (two) times daily.   predniSONE 1 MG tablet Commonly known as:  DELTASONE Take 4 mg by mouth daily with breakfast.   TRADJENTA 5 MG Tabs tablet Generic drug:  linagliptin Take 5 mg by mouth daily.   TRULICITY 1.60 VP/7.1GG Sopn Generic drug:  Dulaglutide Inject 0.5 mLs into the skin once a week.   VOLTAREN 1 % Gel Generic drug:  diclofenac sodium Apply 2 g topically 4 (four) times daily.      No Known Allergies Discharge Instructions    Diet - low sodium heart healthy   Complete by:  As directed     Discharge instructions   Complete by:  As directed    It is important that you read following instructions as well as go over your medication list with RN to help you understand your care after this hospitalization.  Discharge Instructions: Please follow-up with PCP in one week  Please request your primary care physician to go over all Hospital Tests and Procedure/Radiological results at the follow up,  Please get all Hospital records sent to your PCP by signing hospital release before you go home.   Do not drive, operating heavy machinery, perform activities at heights, swimming or participation in water activities or provide baby sitting services; until you have been seen by Primary Care Physician or a Neurologist and advised to do so again. Do not take more than prescribed Pain, Sleep and Anxiety Medications. You were cared for by a hospitalist during your hospital stay. If you have any questions about your discharge medications or the care you received while you were in the hospital  after you are discharged, you can call the unit and ask to speak with the hospitalist on call if the hospitalist that took care of you is not available.  Once you are discharged, your primary care physician will handle any further medical issues. Please note that NO REFILLS for any discharge medications will be authorized once you are discharged, as it is imperative that you return to your primary care physician (or establish a relationship with a primary care physician if you do not have one) for your aftercare needs so that they can reassess your need for medications and monitor your lab values. You Must read complete instructions/literature along with all the possible adverse reactions/side effects for all the Medicines you take and that have been prescribed to you. Take any new Medicines after you have completely understood and accept all the possible adverse reactions/side effects. Wear Seat belts while driving.    Increase activity slowly   Complete by:  As directed      Discharge Exam: There were no vitals filed for this visit. Vitals:   02/26/18 0506 02/26/18 1400  BP: 135/82 126/74  Pulse: 81 78  Resp: 18   Temp: 97.9 F (36.6 C) (!) 97.5 F (36.4 C)  SpO2:  96%   General: Appear in no distress, no Rash; Oral Mucosa moist. Cardiovascular: S1 and S2 Present, no Murmur, no JVD Respiratory: Bilateral Air entry present and Clear to Auscultation, no Crackles, no wheezes Abdomen: Bowel Sound present, Soft and no tenderness Extremities: no Pedal edema, no calf tenderness Neurology: Grossly no focal neuro deficit.  The results of significant diagnostics from this hospitalization (including imaging, microbiology, ancillary and laboratory) are listed below for reference.    Significant Diagnostic Studies: Ct Head Wo Contrast  Result Date: 02/25/2018 CLINICAL DATA:  84 year old female with acute ataxia and dizziness. EXAM: CT HEAD WITHOUT CONTRAST TECHNIQUE: Contiguous axial images were obtained from the base of the skull through the vertex without intravenous contrast. COMPARISON:  12/05/2017 and prior studies FINDINGS: Brain: No evidence of acute infarction, hemorrhage, hydrocephalus, extra-axial collection or mass lesion/mass effect. Atrophy and chronic small-vessel white matter ischemic changes are again noted. Vascular: Atherosclerotic calcifications noted. Skull: Normal. Negative for fracture or focal lesion. Sinuses/Orbits: No acute finding. Other: None. IMPRESSION: 1. No evidence of acute intracranial abnormality 2. Atrophy and chronic small-vessel white matter ischemic changes. Electronically Signed   By: Margarette Canada M.D.   On: 02/25/2018 16:54   Ct Soft Tissue Neck W Contrast  Result Date: 02/25/2018 CLINICAL DATA:  Initial evaluation for dystonia. We went would you looking for EXAM: CT NECK WITH CONTRAST TECHNIQUE: Multidetector CT imaging of the neck was performed using the standard  protocol following the bolus administration of intravenous contrast. CONTRAST:  28m OMNIPAQUE IOHEXOL 300 MG/ML  SOLN COMPARISON:  None. FINDINGS: Pharynx and larynx: Examination moderately degraded by motion artifact. Oral cavity grossly within normal limits without obvious mass or collection. Oropharynx and nasopharynx grossly normal. Parapharyngeal fat maintained. No retropharyngeal swelling or collection. Epiglottis grossly normal. Remainder of the hypopharynx and supraglottic larynx demonstrates no obvious acute abnormality. Subglottic airway clear. Salivary glands: Salivary glands including the parotid and submandibular glands grossly within normal limits. Thyroid: Thyroid poorly evaluated due to motion. No obvious abnormality. Lymph nodes: No appreciable adenopathy identified on this motion degraded exam. Vascular: Opacification of the major arterial vasculature of the neck. Atherosclerotic change about the aortic arch and carotid bifurcations. Limited intracranial: Calcified atherosclerosis at the skull base. Suspected well-circumscribed lesion measuring 1.7  x 1.1 cm at the left middle cranial fossa, possibly meningioma (series 2, image 8). Visualized intracranial contents otherwise unremarkable. Visualized orbits: Globes and orbital soft tissues within normal limits. Patient status post lens extraction bilaterally. Mastoids and visualized paranasal sinuses: Paranasal sinuses are largely clear. Minimal mucosal thickening at the posterior right maxillary sinus. Visualized mastoids and middle ear cavities are clear. Skeleton: No obvious acute osseous abnormality on this motion degraded exam. No lytic or blastic osseous lesions. Patient is edentulous. Upper chest: Partially visualized upper chest within normal limits. Partially visualized lungs are largely clear. Other: None. IMPRESSION: 1. Limited exam due to extensive motion artifact. No definite acute abnormality identified. 2. Question 1.7 cm intracranial  lesion at the left middle cranial fossa, which could reflect a small meningioma. No associated edema. 3. Moderate atherosclerotic change involving the major arterial vasculature of the head and neck. Electronically Signed   By: Jeannine Boga M.D.   On: 02/25/2018 19:03   Mr Brain Wo Contrast  Result Date: 02/25/2018 CLINICAL DATA:  Initial evaluation for acute altered mental status. EXAM: MRI HEAD WITHOUT CONTRAST TECHNIQUE: Multiplanar, multiecho pulse sequences of the brain and surrounding structures were obtained without intravenous contrast. COMPARISON:  Prior CT from earlier the same day. FINDINGS: Brain: Study moderately degraded by motion artifact. Generalized age-related cerebral atrophy. Patchy and confluent T2/FLAIR hyperintensity within the periventricular and deep white matter both cerebral hemispheres, most consistent with chronic small vessel ischemic change, moderate nature. Chronic microvascular ischemic disease present within the pons as well. No abnormal foci of restricted diffusion to suggest acute or subacute ischemia. Gray-white matter differentiation maintained. No areas of remote or chronic infarction. No evidence for acute or chronic intracranial hemorrhage. No mass lesion, midline shift, or mass effect. No hydrocephalus. No extra-axial fluid collection. Previously questioned lesion at the left middle cranial fossa of appears to possibly reflect a small encephalocele (series 12, image 19). No associated signal abnormality or other finding. Finding is likely incidental in nature and of doubtful clinical significance in the acute setting. Pituitary gland suprasellar region grossly normal. Vascular: Major intracranial vascular flow voids grossly maintained at the skull base. Skull and upper cervical spine: Craniocervical junction within normal limits. Upper cervical spine unremarkable. Bone marrow signal intensity normal. No scalp soft tissue abnormality. Sinuses/Orbits: Globes and  orbital soft tissues grossly within normal limits. Scattered mucosal thickening within the ethmoidal air cells. Paranasal sinuses are otherwise clear. Small left mastoid effusion, likely benign. Other: None. IMPRESSION: 1. No acute intracranial abnormality. 2. Age-related cerebral atrophy with moderate chronic small vessel ischemic disease. 3. Previously questioned lesion at the left middle cranial fossa appears to possibly reflect a small focal encephalocele, not well assessed on this motion degraded exam, although felt to be almost certainly incidental in nature and of doubtful clinical significance. Electronically Signed   By: Jeannine Boga M.D.   On: 02/25/2018 21:11    Microbiology: Recent Results (from the past 240 hour(s))  Urine culture     Status: None   Collection Time: 02/25/18  3:50 PM  Result Value Ref Range Status   Specimen Description   Final    URINE, RANDOM Performed at [facility] [address], [place], [place] [address]    Special Requests   Final    NONE Performed at [facility], [place] [address], [place], [place] [address]    Culture   Final    NO GROWTH Performed at [facility], [place] [address]  [place], [place] [address]    Report Status 02/27/2018 FINAL  Final  Culture, blood (routine x 2)     Status: None (Preliminary result)   Collection Time: 02/25/18  9:42 PM  Result Value Ref Range Status   Specimen Description   Final    BLOOD LEFT ANTECUBITAL Performed at [facility] [address] [place], [place] [address]    Special Requests   Final    BOTTLES DRAWN AEROBIC ONLY Blood Culture adequate volume   Culture   Final    NO GROWTH 3 DAYS Performed at [facility], [place] [address], [place], [place] [address]    Report Status PENDING  Incomplete  Culture, blood (routine x 2)     Status: None (Preliminary result)   Collection Time: 02/25/18  9:42 PM  Result Value Ref Range  Status   Specimen Description   Final    BLOOD RIGHT HAND Performed at [facility] [address], [place], [place] [address]    Special Requests   Final    BOTTLES DRAWN AEROBIC AND ANAEROBIC Blood Culture results may not be optimal due to an inadequate volume of blood received in culture bottles Performed at [facility] [address] [place], [place] [address]    Culture   Final    NO GROWTH 3 DAYS Performed at [facility], [place] [address], [place], [place] [address]    Report Status PENDING  Incomplete     Labs: CBC: Recent Labs  Lab 02/25/18 1550 02/25/18 1558 02/26/18 0706  WBC 9.8  --  7.1  NEUTROABS 8.5*  --   --   HGB 11.3* 12.2 10.5*  HCT 37.2 36.0 34.6*  MCV 80.7  --  80.3  PLT 285  --  440   Basic Metabolic Panel: Recent Labs  Lab 02/25/18 1550 02/25/18 1558 02/26/18 0706  NA 140 138 142  K 5.8* 5.5* 3.4*  CL 105 103 105  CO2 26  --  27  GLUCOSE 221* 219* 87  BUN '15 17 11  ' CREATININE 0.85 0.50 0.44  CALCIUM 8.6*  --  8.2*   Liver Function Tests: Recent Labs  Lab 02/25/18 1550 02/26/18 0706  AST 58* 14*  ALT 13* 11*  ALKPHOS 60 63  BILITOT 1.2 0.3  PROT 6.5 5.8*  ALBUMIN 3.4* 3.0*   CBG: Recent Labs  Lab 02/25/18 1526 02/25/18 2332 02/26/18 0749 02/26/18 1142 02/26/18 1611  GLUCAP 213* 118* 92 207* 182*   Time spent: 35 minutes  Signed:  Berle Mull  [facility] 02/26/2018 , 8:53 AM

## 2018-03-03 DIAGNOSIS — D649 Anemia, unspecified: Secondary | ICD-10-CM | POA: Diagnosis not present

## 2018-03-03 DIAGNOSIS — M160 Bilateral primary osteoarthritis of hip: Secondary | ICD-10-CM | POA: Diagnosis not present

## 2018-03-03 DIAGNOSIS — K5790 Diverticulosis of intestine, part unspecified, without perforation or abscess without bleeding: Secondary | ICD-10-CM | POA: Diagnosis not present

## 2018-03-03 DIAGNOSIS — L97522 Non-pressure chronic ulcer of other part of left foot with fat layer exposed: Secondary | ICD-10-CM | POA: Diagnosis not present

## 2018-03-03 DIAGNOSIS — E11621 Type 2 diabetes mellitus with foot ulcer: Secondary | ICD-10-CM | POA: Diagnosis not present

## 2018-03-03 DIAGNOSIS — M069 Rheumatoid arthritis, unspecified: Secondary | ICD-10-CM | POA: Diagnosis not present

## 2018-03-03 LAB — CULTURE, BLOOD (ROUTINE X 2)
CULTURE: NO GROWTH
Culture: NO GROWTH
SPECIAL REQUESTS: ADEQUATE

## 2018-03-05 DIAGNOSIS — D649 Anemia, unspecified: Secondary | ICD-10-CM | POA: Diagnosis not present

## 2018-03-05 DIAGNOSIS — K5790 Diverticulosis of intestine, part unspecified, without perforation or abscess without bleeding: Secondary | ICD-10-CM | POA: Diagnosis not present

## 2018-03-05 DIAGNOSIS — M160 Bilateral primary osteoarthritis of hip: Secondary | ICD-10-CM | POA: Diagnosis not present

## 2018-03-05 DIAGNOSIS — L97522 Non-pressure chronic ulcer of other part of left foot with fat layer exposed: Secondary | ICD-10-CM | POA: Diagnosis not present

## 2018-03-05 DIAGNOSIS — M069 Rheumatoid arthritis, unspecified: Secondary | ICD-10-CM | POA: Diagnosis not present

## 2018-03-05 DIAGNOSIS — E11621 Type 2 diabetes mellitus with foot ulcer: Secondary | ICD-10-CM | POA: Diagnosis not present

## 2018-03-11 ENCOUNTER — Emergency Department (HOSPITAL_COMMUNITY): Payer: Medicare Other

## 2018-03-11 ENCOUNTER — Emergency Department (HOSPITAL_COMMUNITY)
Admission: EM | Admit: 2018-03-11 | Discharge: 2018-03-11 | Disposition: A | Discharge status: Home / Self Care | Payer: Medicare Other | Attending: Emergency Medicine | Admitting: Emergency Medicine

## 2018-03-11 ENCOUNTER — Encounter (HOSPITAL_COMMUNITY): Payer: Self-pay | Admitting: Emergency Medicine

## 2018-03-11 DIAGNOSIS — I10 Essential (primary) hypertension: Secondary | ICD-10-CM | POA: Insufficient documentation

## 2018-03-11 DIAGNOSIS — Z7984 Long term (current) use of oral hypoglycemic drugs: Secondary | ICD-10-CM | POA: Diagnosis not present

## 2018-03-11 DIAGNOSIS — E039 Hypothyroidism, unspecified: Secondary | ICD-10-CM | POA: Diagnosis not present

## 2018-03-11 DIAGNOSIS — E119 Type 2 diabetes mellitus without complications: Secondary | ICD-10-CM | POA: Diagnosis not present

## 2018-03-11 DIAGNOSIS — R0902 Hypoxemia: Secondary | ICD-10-CM | POA: Diagnosis not present

## 2018-03-11 DIAGNOSIS — Z79899 Other long term (current) drug therapy: Secondary | ICD-10-CM | POA: Insufficient documentation

## 2018-03-11 DIAGNOSIS — R531 Weakness: Secondary | ICD-10-CM | POA: Insufficient documentation

## 2018-03-11 DIAGNOSIS — E1165 Type 2 diabetes mellitus with hyperglycemia: Secondary | ICD-10-CM | POA: Diagnosis not present

## 2018-03-11 DIAGNOSIS — R000 Tachycardia, unspecified: Secondary | ICD-10-CM | POA: Diagnosis not present

## 2018-03-11 DIAGNOSIS — R4020 Unspecified coma: Secondary | ICD-10-CM | POA: Diagnosis not present

## 2018-03-11 LAB — COMPREHENSIVE METABOLIC PANEL
ALK PHOS: 75 U/L (ref 38–126)
ALT: 13 U/L — AB (ref 14–54)
ANION GAP: 9 (ref 5–15)
AST: 16 U/L (ref 15–41)
Albumin: 3 g/dL — ABNORMAL LOW (ref 3.5–5.0)
BILIRUBIN TOTAL: 0.6 mg/dL (ref 0.3–1.2)
BUN: 9 mg/dL (ref 6–20)
CALCIUM: 8.1 mg/dL — AB (ref 8.9–10.3)
CO2: 26 mmol/L (ref 22–32)
CREATININE: 0.49 mg/dL (ref 0.44–1.00)
Chloride: 107 mmol/L (ref 101–111)
GFR calc non Af Amer: >60 mL/min (ref >60)
GLUCOSE: 199 mg/dL — AB (ref 65–99)
Potassium: 3.2 mmol/L — ABNORMAL LOW (ref 3.5–5.1)
Sodium: 142 mmol/L (ref 135–145)
TOTAL PROTEIN: 6.2 g/dL — AB (ref 6.5–8.1)

## 2018-03-11 LAB — URINALYSIS, ROUTINE W REFLEX MICROSCOPIC
Bacteria, UA: NONE SEEN
Bilirubin Urine: NEGATIVE
Glucose, UA: NEGATIVE mg/dL
Hgb urine dipstick: NEGATIVE
Ketones, ur: 5 mg/dL — AB
LEUKOCYTES UA: NEGATIVE
Nitrite: NEGATIVE
PH: 5 (ref 5.0–8.0)
Protein, ur: 30 mg/dL — AB
Specific Gravity, Urine: 1.019 (ref 1.005–1.030)

## 2018-03-11 LAB — CBC WITH DIFFERENTIAL/PLATELET
Basophils Absolute: 0 10*3/uL (ref 0.0–0.1)
Basophils Relative: 0 %
Eosinophils Absolute: 0.1 10*3/uL (ref 0.0–0.7)
Eosinophils Relative: 1 %
HEMATOCRIT: 34.5 % — AB (ref 36.0–46.0)
HEMOGLOBIN: 10.7 g/dL — AB (ref 12.0–15.0)
LYMPHS ABS: 0.5 10*3/uL — AB (ref 0.7–4.0)
LYMPHS PCT: 5 %
MCH: 24.8 pg — AB (ref 26.0–34.0)
MCHC: 31 g/dL (ref 30.0–36.0)
MCV: 80 fL (ref 78.0–100.0)
MONOS PCT: 7 %
Monocytes Absolute: 0.7 10*3/uL (ref 0.1–1.0)
NEUTROS ABS: 9.5 10*3/uL — AB (ref 1.7–7.7)
NEUTROS PCT: 87 %
Platelets: 313 10*3/uL (ref 150–400)
RBC: 4.31 MIL/uL (ref 3.87–5.11)
RDW: 16.5 % — ABNORMAL HIGH (ref 11.5–15.5)
WBC: 10.8 10*3/uL — ABNORMAL HIGH (ref 4.0–10.5)

## 2018-03-11 LAB — I-STAT TROPONIN, ED: Troponin i, poc: 0.07 ng/mL (ref 0.00–0.08)

## 2018-03-11 NOTE — ED Notes (Signed)
Caregiver at bedside

## 2018-03-11 NOTE — ED Provider Notes (Signed)
[facility] DEPT Provider Note   CSN: [id number] Arrival date & time: 03/11/18  1408     History   Chief Complaint Chief Complaint  Patient presents with  . Weakness    HPI [name] is a 84 y.o. female.  84 year old female with prior history significant for atrial fibrillation, frequent UTI, diabetes, and hypertension presents with reported complaint of generalized weakness.  Patient reports that one of her caregivers thought that she was "weak" earlier today.  She currently denies any complaint.  She denies recent weakness.  She denies recent chest pain, shortness of breath, nausea, vomiting, or other acute complaint.  She is alert and oriented to person, place, and time and appears comfortable.   She consents to an evaluation - although, her biggest issue appears to be "not having enough pillows."   The history is provided by the patient and medical records.  Weakness  Primary symptoms include no focal weakness, no loss of sensation, no loss of balance. Primary symptoms comment: generalized weakness. This is a new problem. The current episode started 6 to 12 hours ago. The problem has been resolved. There was no focality noted. There has been no fever.    Past Medical History:  Diagnosis Date  . Acute bronchitis   . Anemia, unspecified   . Atrial fibrillation ([place])   . Depressive disorder, not elsewhere classified   . Diverticulosis of colon (without mention of hemorrhage)   . Esophageal reflux   . Family history of malignant neoplasm of gastrointestinal tract   . Functional diarrhea   . Hiatal hernia   . History of blood transfusion    "once; related to diverticulitis" (11/29/2017)  . Other and unspecified hyperlipidemia   . Other chest pain   . Other specified cardiac dysrhythmias(427.89)   . Personal history of colonic polyps 07/17/1995   adenomatous polyps  . PONV (postoperative nausea and vomiting)   . Recurrent UTI (urinary  tract infection)    "2-3 times in the last 1 1/53yr" (11/29/2017)  . Rheumatoid arthritis ([place])   . Tachycardia, unspecified   . Type II diabetes mellitus ([place])   . Unspecified adverse effect of unspecified drug, medicinal and biological substance   . Unspecified essential hypertension   . Unspecified hypothyroidism     Patient Active Problem List   Diagnosis Date Noted  . Pressure injury of skin 02/26/2018  . Hyperkalemia 02/25/2018  . Altered mental status 02/25/2018  . Acute lower UTI 02/25/2018  . Iron deficiency anemia due to chronic blood loss 12/17/2017  . Protein-calorie malnutrition, severe 11/30/2017  . Syncope 11/29/2017  . Diabetic ulcer of left foot associated with type 2 diabetes mellitus ([place]) 08/11/2015  . Closed right hip fracture ([place]) 08/28/2012  . History of gastroesophageal reflux (GERD) 06/26/2011  . Lower GI bleeding 06/26/2011  . DEPRESSION 03/31/2009  . DIARRHEA, FUNCTIONAL 03/31/2009  . DIABETIC HYPOGLYCEMIA, TYPE II 11/03/2008  . ACUTE BRONCHITIS 08/23/2008  . AV NODAL REENTRY TACHYCARDIA 10/29/2007  . TACHYCARDIA 10/29/2007  . CHEST PAIN, ATYPICAL 10/25/2007  . ADVEF, DRUG/MEDICINAL/BIOLOGICAL SUBST NOS 07/16/2007  . Nonspecific (abnormal) findings on radiological and other examination of body structure 04/03/2007  . CHEST XRAY, ABNORMAL 04/03/2007  . Hypothyroidism 03/06/2007  . Type 2 diabetes mellitus with other specified complication ([place]) 48/54/6270  . HYPERLIPIDEMIA 03/06/2007  . Essential hypertension 03/06/2007  . DIVERTICULOSIS, COLON 03/06/2007  . Rheumatoid arthritis involving both hands with positive rheumatoid factor ([place]) 03/06/2007    Past Surgical History:  Procedure Laterality Date  . ABDOMINAL HYSTERECTOMY     "partial"  . CATARACT EXTRACTION Bilateral   . FOREARM FRACTURE SURGERY Right 1940s?  . FRACTURE SURGERY    . ORIF FEMUR FRACTURE  08/30/2012   Procedure: OPEN REDUCTION INTERNAL FIXATION (ORIF) DISTAL FEMUR FRACTURE;   Surgeon: Mauri Pole, MD;  Location: [facility] ORS;  Service: Orthopedics;  Laterality: Right;  proximal femur  . THYROIDECTOMY, PARTIAL       OB History   None      Home Medications    Prior to Admission medications   Medication Sig Start Date End Date Taking? Authorizing Provider  feeding supplement, ENSURE ENLIVE, (ENSURE ENLIVE) LIQD Take 237 mLs by mouth 2 (two) times daily between meals. 02/26/18   Lavina Hamman, MD  ferrous sulfate 325 (65 FE) MG EC tablet Take 1 tablet (325 mg total) by mouth 2 (two) times daily. Patient not taking: Reported on 02/25/2018 12/07/17 12/07/18  Regalado, Jerald Kief A, MD  leflunomide (ARAVA) 20 MG tablet Take 20 mg by mouth daily. 08/14/17   [provider]  levothyroxine (SYNTHROID) 100 MCG tablet Take 1 tablet (100 mcg total) by mouth daily. 12/07/17 12/07/18  Regalado, Belkys A, MD  magnesium oxide (MAG-OX) 400 (241.3 Mg) MG tablet Take 0.5 tablets (200 mg total) by mouth 2 (two) times daily. 12/07/17   Regalado, Belkys A, MD  Melatonin 2.5 MG CAPS Take 1 capsule (2.5 mg total) by mouth at bedtime as needed (for sleep). 12/07/17   Regalado, Belkys A, MD  metFORMIN (GLUCOPHAGE-XR) 500 MG 24 hr tablet Take 500-1,000 mg by mouth 2 (two) times daily. Take 1000 mg by mouth in the morning and 500 mg by mouth in the evening 03/07/15   [provider]  mirtazapine (REMERON) 7.5 MG tablet Take 7.5 mg by mouth at bedtime. 02/03/18   [provider]  omeprazole (PRILOSEC) 40 MG capsule Take 1 capsule (40 mg total) by mouth 2 (two) times daily. 12/07/17   Regalado, Belkys A, MD  predniSONE (DELTASONE) 1 MG tablet Take 4 mg by mouth daily with breakfast.     [provider]  TRADJENTA 5 MG TABS tablet Take 5 mg by mouth daily. 01/29/18   [provider]  TRULICITY 3.53 IR/4.4RX SOPN Inject 0.5 mLs into the skin once a week.  12/17/17   [provider]  VOLTAREN 1 % GEL Apply 2 g topically 4 (four) times daily.  11/30/13   [provider]    Family History Family History  Problem Relation Age of Onset  . Colon cancer Maternal Grandmother     Social History Social History   Tobacco Use  . Smoking status: Never Smoker  . Smokeless tobacco: Never Used  Substance Use Topics  . Alcohol use: Yes    Alcohol/week: 0.6 oz    Types: 1 Glasses of wine per week  . Drug use: No     Allergies   Patient has no known allergies.   Review of Systems Review of Systems  Neurological: Positive for weakness. Negative for focal weakness and loss of balance.  All other systems reviewed and are negative.    Physical Exam Updated Vital Signs BP 133/89   Pulse 87   Temp 98.5 F (36.9 C) (Oral)   Resp 10   SpO2 97%   Physical Exam  Constitutional: She is oriented to person, place, and time. She appears well-developed. No distress.  Frail-appearing elderly female  No acute distress  Alert  and oriented x4    HENT:  Head: Normocephalic and atraumatic.  Mouth/Throat: Oropharynx is clear and moist.  Eyes: Pupils are equal, round, and reactive to light. Conjunctivae and EOM are normal.  Neck: Normal range of motion. Neck supple.  Cardiovascular: Normal rate, regular rhythm and normal heart sounds.  Pulmonary/Chest: Effort normal and breath sounds normal. No respiratory distress.  Abdominal: Soft. She exhibits no distension. There is no tenderness.  Musculoskeletal: Normal range of motion. She exhibits no edema or deformity.  Neurological: She is alert and oriented to person, place, and time.  Skin: Skin is warm and dry.  Psychiatric: She has a normal mood and affect.  Nursing note and vitals reviewed.    ED Treatments / Results  Labs (all labs ordered are listed, but only abnormal results are displayed) Labs Reviewed  URINALYSIS, ROUTINE W REFLEX MICROSCOPIC - Abnormal; Notable for the following components:      Result Value   Ketones, ur 5 (*)    Protein, ur 30 (*)    All other components  within normal limits  CBC WITH DIFFERENTIAL/PLATELET - Abnormal; Notable for the following components:   WBC 10.8 (*)    Hemoglobin 10.7 (*)    HCT 34.5 (*)    MCH 24.8 (*)    RDW 16.5 (*)    Neutro Abs 9.5 (*)    Lymphs Abs 0.5 (*)    All other components within normal limits  COMPREHENSIVE METABOLIC PANEL - Abnormal; Notable for the following components:   Potassium 3.2 (*)    Glucose, Bld 199 (*)    Calcium 8.1 (*)    Total Protein 6.2 (*)    Albumin 3.0 (*)    ALT 13 (*)    All other components within normal limits  I-STAT TROPONIN, ED    EKG EKG Interpretation  Date/Time:  Tuesday March 11 2018 14:26:46 EDT Ventricular Rate:  76 PR Interval:    QRS Duration: 87 QT Interval:  363 QTC Calculation: 409 R Axis:   69 Text Interpretation:  Sinus rhythm Atrial premature complexes in couplets LVH with secondary repolarization abnormality Confirmed by Dene Gentry [phone]) on 03/11/2018 3:59:13 PM   Radiology Ct Head Wo Contrast  Result Date: 03/11/2018 CLINICAL DATA:  Altered level of consciousness, generalized weakness today, history atrial fibrillation, type II diabetes mellitus, essential hypertension EXAM: CT HEAD WITHOUT CONTRAST TECHNIQUE: Contiguous axial images were obtained from the base of the skull through the vertex without intravenous contrast. Sagittal and coronal MPR images reconstructed from axial data set. COMPARISON:  02/25/2018 FINDINGS: Brain: Generalized atrophy. Normal ventricular morphology. No midline shift or mass effect. Small vessel chronic ischemic changes of deep cerebral white matter. No intracranial hemorrhage, mass lesion, evidence of acute infarction, or extra-axial fluid collection. Vascular: No hyperdense vessels. Atherosclerotic calcifications of internal carotid and vertebral arteries at skull base Skull: Demineralized but intact Sinuses/Orbits: Clear Other: N/A IMPRESSION: Atrophy with small vessel chronic ischemic changes of deep cerebral white  matter. No acute intracranial abnormalities. Electronically Signed   By: Lavonia Dana M.D.   On: 03/11/2018 16:58   Dg Chest Port 1 View  Result Date: 03/11/2018 CLINICAL DATA:  Generalized weakness EXAM: PORTABLE CHEST 1 VIEW COMPARISON:  12/05/2017 FINDINGS: Cardiac shadow is at the upper limits of normal in size. Aortic calcifications are again seen. The lungs are well aerated without focal infiltrate or sizable effusion. No bony abnormality is noted. IMPRESSION: No active disease. Electronically Signed   By: Linus Mako.D.  On: 03/11/2018 16:00    Procedures Procedures (including critical care time)  Medications Ordered in ED Medications - No data to display   Initial Impression / Assessment and Plan / ED Course  I have reviewed the triage vital signs and the nursing notes.  Pertinent labs & imaging results that were available during my care of the patient were reviewed by me and considered in my medical decision making (see chart for details).     MDM  Screen complete  Patient is presenting for evaluation of generalized weakness.  Upon my initial evaluation there is no specific findings.  Screening labs do not reveal significant acute pathology.  Patient remained comfortable during her evaluation time.  She desires discharge.  She declines further ED observation and/or admission.  She understands need for close follow-up.  Caregiver at bedside will remain with the patient this evening. Strict return precautions given to patient and care provider.  Final Clinical Impressions(s) / ED Diagnoses   Final diagnoses:  Weakness    ED Discharge Orders    None       Valarie Merino, MD 03/11/18 [phone]

## 2018-03-11 NOTE — Discharge Instructions (Addendum)
Please return for any problem.  Follow-up with your regular doctor as instructed within the next 2 to 3 days.

## 2018-03-11 NOTE — ED Triage Notes (Signed)
Per EMS, patient from home, c/o generalized weakness today. Denies N/V/D and pain. A&Ox4. EMS reports contraction of muscles to right neck.   BP 130/76 HR 94 CBG 329  20g L wrist

## 2018-03-11 NOTE — ED Notes (Signed)
Bed: WA06 Expected date:  Expected time:  Means of arrival:  Comments: 84 yo ems

## 2018-03-14 DIAGNOSIS — M069 Rheumatoid arthritis, unspecified: Secondary | ICD-10-CM | POA: Diagnosis not present

## 2018-03-14 DIAGNOSIS — I10 Essential (primary) hypertension: Secondary | ICD-10-CM | POA: Diagnosis not present

## 2018-03-14 DIAGNOSIS — L97522 Non-pressure chronic ulcer of other part of left foot with fat layer exposed: Secondary | ICD-10-CM | POA: Diagnosis not present

## 2018-03-14 DIAGNOSIS — E1142 Type 2 diabetes mellitus with diabetic polyneuropathy: Secondary | ICD-10-CM | POA: Diagnosis not present

## 2018-03-14 DIAGNOSIS — E11621 Type 2 diabetes mellitus with foot ulcer: Secondary | ICD-10-CM | POA: Diagnosis not present

## 2018-03-21 DIAGNOSIS — I10 Essential (primary) hypertension: Secondary | ICD-10-CM | POA: Diagnosis not present

## 2018-03-21 DIAGNOSIS — L97522 Non-pressure chronic ulcer of other part of left foot with fat layer exposed: Secondary | ICD-10-CM | POA: Diagnosis not present

## 2018-03-21 DIAGNOSIS — E1142 Type 2 diabetes mellitus with diabetic polyneuropathy: Secondary | ICD-10-CM | POA: Diagnosis not present

## 2018-03-21 DIAGNOSIS — M069 Rheumatoid arthritis, unspecified: Secondary | ICD-10-CM | POA: Diagnosis not present

## 2018-03-21 DIAGNOSIS — E11621 Type 2 diabetes mellitus with foot ulcer: Secondary | ICD-10-CM | POA: Diagnosis not present

## 2018-03-28 ENCOUNTER — Encounter (HOSPITAL_BASED_OUTPATIENT_CLINIC_OR_DEPARTMENT_OTHER): Payer: Medicare Other | Attending: Internal Medicine

## 2018-03-28 DIAGNOSIS — I10 Essential (primary) hypertension: Secondary | ICD-10-CM | POA: Diagnosis not present

## 2018-03-28 DIAGNOSIS — L97422 Non-pressure chronic ulcer of left heel and midfoot with fat layer exposed: Secondary | ICD-10-CM | POA: Diagnosis not present

## 2018-03-28 DIAGNOSIS — E11621 Type 2 diabetes mellitus with foot ulcer: Secondary | ICD-10-CM | POA: Diagnosis not present

## 2018-03-28 DIAGNOSIS — L97522 Non-pressure chronic ulcer of other part of left foot with fat layer exposed: Secondary | ICD-10-CM | POA: Diagnosis not present

## 2018-03-31 DIAGNOSIS — I350 Nonrheumatic aortic (valve) stenosis: Secondary | ICD-10-CM | POA: Diagnosis not present

## 2018-03-31 DIAGNOSIS — R634 Abnormal weight loss: Secondary | ICD-10-CM | POA: Diagnosis not present

## 2018-03-31 DIAGNOSIS — I10 Essential (primary) hypertension: Secondary | ICD-10-CM | POA: Diagnosis not present

## 2018-03-31 DIAGNOSIS — E11621 Type 2 diabetes mellitus with foot ulcer: Secondary | ICD-10-CM | POA: Diagnosis not present

## 2018-03-31 DIAGNOSIS — M069 Rheumatoid arthritis, unspecified: Secondary | ICD-10-CM | POA: Diagnosis not present

## 2018-03-31 DIAGNOSIS — D649 Anemia, unspecified: Secondary | ICD-10-CM | POA: Diagnosis not present

## 2018-03-31 DIAGNOSIS — E1149 Type 2 diabetes mellitus with other diabetic neurological complication: Secondary | ICD-10-CM | POA: Diagnosis not present

## 2018-03-31 DIAGNOSIS — L97522 Non-pressure chronic ulcer of other part of left foot with fat layer exposed: Secondary | ICD-10-CM | POA: Diagnosis not present

## 2018-03-31 DIAGNOSIS — K5790 Diverticulosis of intestine, part unspecified, without perforation or abscess without bleeding: Secondary | ICD-10-CM | POA: Diagnosis not present

## 2018-03-31 DIAGNOSIS — M160 Bilateral primary osteoarthritis of hip: Secondary | ICD-10-CM | POA: Diagnosis not present

## 2018-03-31 DIAGNOSIS — E039 Hypothyroidism, unspecified: Secondary | ICD-10-CM | POA: Diagnosis not present

## 2018-03-31 DIAGNOSIS — R300 Dysuria: Secondary | ICD-10-CM | POA: Diagnosis not present

## 2018-04-01 DIAGNOSIS — M810 Age-related osteoporosis without current pathological fracture: Secondary | ICD-10-CM | POA: Diagnosis not present

## 2018-04-01 DIAGNOSIS — M4800 Spinal stenosis, site unspecified: Secondary | ICD-10-CM | POA: Diagnosis not present

## 2018-04-01 DIAGNOSIS — E11621 Type 2 diabetes mellitus with foot ulcer: Secondary | ICD-10-CM | POA: Diagnosis not present

## 2018-04-01 DIAGNOSIS — K5790 Diverticulosis of intestine, part unspecified, without perforation or abscess without bleeding: Secondary | ICD-10-CM | POA: Diagnosis not present

## 2018-04-01 DIAGNOSIS — D649 Anemia, unspecified: Secondary | ICD-10-CM | POA: Diagnosis not present

## 2018-04-01 DIAGNOSIS — Z9181 History of falling: Secondary | ICD-10-CM | POA: Diagnosis not present

## 2018-04-01 DIAGNOSIS — M069 Rheumatoid arthritis, unspecified: Secondary | ICD-10-CM | POA: Diagnosis not present

## 2018-04-01 DIAGNOSIS — E43 Unspecified severe protein-calorie malnutrition: Secondary | ICD-10-CM | POA: Diagnosis not present

## 2018-04-01 DIAGNOSIS — I10 Essential (primary) hypertension: Secondary | ICD-10-CM | POA: Diagnosis not present

## 2018-04-01 DIAGNOSIS — F329 Major depressive disorder, single episode, unspecified: Secondary | ICD-10-CM | POA: Diagnosis not present

## 2018-04-01 DIAGNOSIS — L97522 Non-pressure chronic ulcer of other part of left foot with fat layer exposed: Secondary | ICD-10-CM | POA: Diagnosis not present

## 2018-04-01 DIAGNOSIS — E039 Hypothyroidism, unspecified: Secondary | ICD-10-CM | POA: Diagnosis not present

## 2018-04-01 DIAGNOSIS — M160 Bilateral primary osteoarthritis of hip: Secondary | ICD-10-CM | POA: Diagnosis not present

## 2018-04-01 DIAGNOSIS — F419 Anxiety disorder, unspecified: Secondary | ICD-10-CM | POA: Diagnosis not present

## 2018-04-01 DIAGNOSIS — Z7984 Long term (current) use of oral hypoglycemic drugs: Secondary | ICD-10-CM | POA: Diagnosis not present

## 2018-04-02 DIAGNOSIS — Z79899 Other long term (current) drug therapy: Secondary | ICD-10-CM | POA: Diagnosis not present

## 2018-04-02 DIAGNOSIS — M0579 Rheumatoid arthritis with rheumatoid factor of multiple sites without organ or systems involvement: Secondary | ICD-10-CM | POA: Diagnosis not present

## 2018-04-02 DIAGNOSIS — M79643 Pain in unspecified hand: Secondary | ICD-10-CM | POA: Diagnosis not present

## 2018-04-02 DIAGNOSIS — D649 Anemia, unspecified: Secondary | ICD-10-CM | POA: Diagnosis not present

## 2018-04-02 DIAGNOSIS — M810 Age-related osteoporosis without current pathological fracture: Secondary | ICD-10-CM | POA: Diagnosis not present

## 2018-04-03 DIAGNOSIS — I10 Essential (primary) hypertension: Secondary | ICD-10-CM | POA: Diagnosis not present

## 2018-04-03 DIAGNOSIS — N390 Urinary tract infection, site not specified: Secondary | ICD-10-CM | POA: Diagnosis not present

## 2018-04-03 DIAGNOSIS — L97522 Non-pressure chronic ulcer of other part of left foot with fat layer exposed: Secondary | ICD-10-CM | POA: Diagnosis not present

## 2018-04-03 DIAGNOSIS — R3915 Urgency of urination: Secondary | ICD-10-CM | POA: Diagnosis not present

## 2018-04-03 DIAGNOSIS — E11621 Type 2 diabetes mellitus with foot ulcer: Secondary | ICD-10-CM | POA: Diagnosis not present

## 2018-04-03 DIAGNOSIS — L97422 Non-pressure chronic ulcer of left heel and midfoot with fat layer exposed: Secondary | ICD-10-CM | POA: Diagnosis not present

## 2018-04-07 DIAGNOSIS — M160 Bilateral primary osteoarthritis of hip: Secondary | ICD-10-CM | POA: Diagnosis not present

## 2018-04-07 DIAGNOSIS — K5790 Diverticulosis of intestine, part unspecified, without perforation or abscess without bleeding: Secondary | ICD-10-CM | POA: Diagnosis not present

## 2018-04-07 DIAGNOSIS — M069 Rheumatoid arthritis, unspecified: Secondary | ICD-10-CM | POA: Diagnosis not present

## 2018-04-07 DIAGNOSIS — L97522 Non-pressure chronic ulcer of other part of left foot with fat layer exposed: Secondary | ICD-10-CM | POA: Diagnosis not present

## 2018-04-07 DIAGNOSIS — D649 Anemia, unspecified: Secondary | ICD-10-CM | POA: Diagnosis not present

## 2018-04-07 DIAGNOSIS — E11621 Type 2 diabetes mellitus with foot ulcer: Secondary | ICD-10-CM | POA: Diagnosis not present

## 2018-04-14 DIAGNOSIS — I10 Essential (primary) hypertension: Secondary | ICD-10-CM | POA: Diagnosis not present

## 2018-04-14 DIAGNOSIS — L97522 Non-pressure chronic ulcer of other part of left foot with fat layer exposed: Secondary | ICD-10-CM | POA: Diagnosis not present

## 2018-04-14 DIAGNOSIS — E11621 Type 2 diabetes mellitus with foot ulcer: Secondary | ICD-10-CM | POA: Diagnosis not present

## 2018-04-14 DIAGNOSIS — L97422 Non-pressure chronic ulcer of left heel and midfoot with fat layer exposed: Secondary | ICD-10-CM | POA: Diagnosis not present

## 2018-04-15 DIAGNOSIS — F339 Major depressive disorder, recurrent, unspecified: Secondary | ICD-10-CM | POA: Diagnosis not present

## 2018-04-15 DIAGNOSIS — Z87440 Personal history of urinary (tract) infections: Secondary | ICD-10-CM | POA: Diagnosis not present

## 2018-04-15 DIAGNOSIS — E039 Hypothyroidism, unspecified: Secondary | ICD-10-CM | POA: Diagnosis not present

## 2018-04-15 DIAGNOSIS — E46 Unspecified protein-calorie malnutrition: Secondary | ICD-10-CM | POA: Diagnosis not present

## 2018-04-17 DIAGNOSIS — D649 Anemia, unspecified: Secondary | ICD-10-CM | POA: Diagnosis not present

## 2018-04-17 DIAGNOSIS — E11621 Type 2 diabetes mellitus with foot ulcer: Secondary | ICD-10-CM | POA: Diagnosis not present

## 2018-04-17 DIAGNOSIS — M069 Rheumatoid arthritis, unspecified: Secondary | ICD-10-CM | POA: Diagnosis not present

## 2018-04-21 DIAGNOSIS — I10 Essential (primary) hypertension: Secondary | ICD-10-CM | POA: Diagnosis not present

## 2018-04-21 DIAGNOSIS — E11621 Type 2 diabetes mellitus with foot ulcer: Secondary | ICD-10-CM | POA: Diagnosis not present

## 2018-04-21 DIAGNOSIS — L97522 Non-pressure chronic ulcer of other part of left foot with fat layer exposed: Secondary | ICD-10-CM | POA: Diagnosis not present

## 2018-04-21 DIAGNOSIS — L97422 Non-pressure chronic ulcer of left heel and midfoot with fat layer exposed: Secondary | ICD-10-CM | POA: Diagnosis not present

## 2018-04-22 DIAGNOSIS — Z79899 Other long term (current) drug therapy: Secondary | ICD-10-CM | POA: Diagnosis not present

## 2018-04-22 DIAGNOSIS — H353132 Nonexudative age-related macular degeneration, bilateral, intermediate dry stage: Secondary | ICD-10-CM | POA: Diagnosis not present

## 2018-04-22 DIAGNOSIS — M069 Rheumatoid arthritis, unspecified: Secondary | ICD-10-CM | POA: Diagnosis not present

## 2018-04-22 DIAGNOSIS — H52203 Unspecified astigmatism, bilateral: Secondary | ICD-10-CM | POA: Diagnosis not present

## 2018-04-25 ENCOUNTER — Observation Stay (HOSPITAL_COMMUNITY): Payer: Medicare Other

## 2018-04-25 ENCOUNTER — Other Ambulatory Visit: Payer: Self-pay

## 2018-04-25 ENCOUNTER — Inpatient Hospital Stay (HOSPITAL_COMMUNITY)
Admission: EM | Admit: 2018-04-25 | Discharge: 2018-04-29 | DRG: 069 | Disposition: A | Discharge status: Home Health | Payer: Medicare Other | Attending: Internal Medicine | Admitting: Internal Medicine

## 2018-04-25 ENCOUNTER — Emergency Department (HOSPITAL_COMMUNITY): Payer: Medicare Other

## 2018-04-25 ENCOUNTER — Encounter (HOSPITAL_COMMUNITY): Payer: Self-pay

## 2018-04-25 DIAGNOSIS — Z79890 Hormone replacement therapy (postmenopausal): Secondary | ICD-10-CM

## 2018-04-25 DIAGNOSIS — G9341 Metabolic encephalopathy: Secondary | ICD-10-CM | POA: Diagnosis not present

## 2018-04-25 DIAGNOSIS — I10 Essential (primary) hypertension: Secondary | ICD-10-CM | POA: Diagnosis not present

## 2018-04-25 DIAGNOSIS — Z79899 Other long term (current) drug therapy: Secondary | ICD-10-CM

## 2018-04-25 DIAGNOSIS — J690 Pneumonitis due to inhalation of food and vomit: Secondary | ICD-10-CM | POA: Diagnosis present

## 2018-04-25 DIAGNOSIS — E860 Dehydration: Secondary | ICD-10-CM | POA: Diagnosis not present

## 2018-04-25 DIAGNOSIS — Z86010 Personal history of colonic polyps: Secondary | ICD-10-CM

## 2018-04-25 DIAGNOSIS — Z90710 Acquired absence of both cervix and uterus: Secondary | ICD-10-CM

## 2018-04-25 DIAGNOSIS — Z993 Dependence on wheelchair: Secondary | ICD-10-CM

## 2018-04-25 DIAGNOSIS — I493 Ventricular premature depolarization: Secondary | ICD-10-CM | POA: Diagnosis present

## 2018-04-25 DIAGNOSIS — R410 Disorientation, unspecified: Secondary | ICD-10-CM | POA: Diagnosis not present

## 2018-04-25 DIAGNOSIS — Z7984 Long term (current) use of oral hypoglycemic drugs: Secondary | ICD-10-CM

## 2018-04-25 DIAGNOSIS — F329 Major depressive disorder, single episode, unspecified: Secondary | ICD-10-CM | POA: Diagnosis present

## 2018-04-25 DIAGNOSIS — M160 Bilateral primary osteoarthritis of hip: Secondary | ICD-10-CM | POA: Diagnosis not present

## 2018-04-25 DIAGNOSIS — I350 Nonrheumatic aortic (valve) stenosis: Secondary | ICD-10-CM | POA: Diagnosis present

## 2018-04-25 DIAGNOSIS — E11621 Type 2 diabetes mellitus with foot ulcer: Secondary | ICD-10-CM | POA: Diagnosis not present

## 2018-04-25 DIAGNOSIS — E876 Hypokalemia: Secondary | ICD-10-CM | POA: Diagnosis present

## 2018-04-25 DIAGNOSIS — G459 Transient cerebral ischemic attack, unspecified: Secondary | ICD-10-CM | POA: Diagnosis not present

## 2018-04-25 DIAGNOSIS — I4891 Unspecified atrial fibrillation: Secondary | ICD-10-CM | POA: Diagnosis not present

## 2018-04-25 DIAGNOSIS — R0600 Dyspnea, unspecified: Secondary | ICD-10-CM

## 2018-04-25 DIAGNOSIS — R29810 Facial weakness: Secondary | ICD-10-CM | POA: Diagnosis not present

## 2018-04-25 DIAGNOSIS — J189 Pneumonia, unspecified organism: Secondary | ICD-10-CM | POA: Diagnosis not present

## 2018-04-25 DIAGNOSIS — M05742 Rheumatoid arthritis with rheumatoid factor of left hand without organ or systems involvement: Secondary | ICD-10-CM | POA: Diagnosis not present

## 2018-04-25 DIAGNOSIS — J181 Lobar pneumonia, unspecified organism: Secondary | ICD-10-CM | POA: Diagnosis present

## 2018-04-25 DIAGNOSIS — L97522 Non-pressure chronic ulcer of other part of left foot with fat layer exposed: Secondary | ICD-10-CM | POA: Diagnosis not present

## 2018-04-25 DIAGNOSIS — K5790 Diverticulosis of intestine, part unspecified, without perforation or abscess without bleeding: Secondary | ICD-10-CM | POA: Diagnosis not present

## 2018-04-25 DIAGNOSIS — Z87440 Personal history of urinary (tract) infections: Secondary | ICD-10-CM

## 2018-04-25 DIAGNOSIS — D649 Anemia, unspecified: Secondary | ICD-10-CM | POA: Diagnosis not present

## 2018-04-25 DIAGNOSIS — E039 Hypothyroidism, unspecified: Secondary | ICD-10-CM | POA: Diagnosis present

## 2018-04-25 DIAGNOSIS — K219 Gastro-esophageal reflux disease without esophagitis: Secondary | ICD-10-CM | POA: Diagnosis present

## 2018-04-25 DIAGNOSIS — R471 Dysarthria and anarthria: Secondary | ICD-10-CM | POA: Diagnosis not present

## 2018-04-25 DIAGNOSIS — M069 Rheumatoid arthritis, unspecified: Secondary | ICD-10-CM | POA: Diagnosis present

## 2018-04-25 DIAGNOSIS — R079 Chest pain, unspecified: Secondary | ICD-10-CM | POA: Diagnosis not present

## 2018-04-25 DIAGNOSIS — Z7952 Long term (current) use of systemic steroids: Secondary | ICD-10-CM

## 2018-04-25 DIAGNOSIS — E785 Hyperlipidemia, unspecified: Secondary | ICD-10-CM | POA: Diagnosis present

## 2018-04-25 DIAGNOSIS — R4781 Slurred speech: Secondary | ICD-10-CM | POA: Diagnosis not present

## 2018-04-25 DIAGNOSIS — M05741 Rheumatoid arthritis with rheumatoid factor of right hand without organ or systems involvement: Secondary | ICD-10-CM | POA: Diagnosis present

## 2018-04-25 DIAGNOSIS — E1169 Type 2 diabetes mellitus with other specified complication: Secondary | ICD-10-CM | POA: Diagnosis not present

## 2018-04-25 DIAGNOSIS — R4701 Aphasia: Secondary | ICD-10-CM | POA: Diagnosis not present

## 2018-04-25 DIAGNOSIS — I639 Cerebral infarction, unspecified: Secondary | ICD-10-CM | POA: Diagnosis not present

## 2018-04-25 DIAGNOSIS — R404 Transient alteration of awareness: Secondary | ICD-10-CM | POA: Diagnosis not present

## 2018-04-25 LAB — URINALYSIS, ROUTINE W REFLEX MICROSCOPIC
BACTERIA UA: NONE SEEN
BILIRUBIN URINE: NEGATIVE
Glucose, UA: 50 mg/dL — AB
Hgb urine dipstick: NEGATIVE
KETONES UR: NEGATIVE mg/dL
NITRITE: NEGATIVE
PROTEIN: NEGATIVE mg/dL
SPECIFIC GRAVITY, URINE: 1.015 (ref 1.005–1.030)
pH: 7 (ref 5.0–8.0)

## 2018-04-25 LAB — DIFFERENTIAL
Abs Immature Granulocytes: 0 10*3/uL (ref 0.0–0.1)
BASOS ABS: 0 10*3/uL (ref 0.0–0.1)
BASOS PCT: 0 %
EOS PCT: 2 %
Eosinophils Absolute: 0.2 10*3/uL (ref 0.0–0.7)
Immature Granulocytes: 0 %
Lymphocytes Relative: 14 %
Lymphs Abs: 1.3 10*3/uL (ref 0.7–4.0)
MONO ABS: 0.9 10*3/uL (ref 0.1–1.0)
Monocytes Relative: 9 %
NEUTROS PCT: 75 %
Neutro Abs: 7 10*3/uL (ref 1.7–7.7)

## 2018-04-25 LAB — RAPID URINE DRUG SCREEN, HOSP PERFORMED
AMPHETAMINES: NOT DETECTED
Barbiturates: NOT DETECTED
Benzodiazepines: NOT DETECTED
Cocaine: NOT DETECTED
Opiates: NOT DETECTED
Tetrahydrocannabinol: NOT DETECTED

## 2018-04-25 LAB — COMPREHENSIVE METABOLIC PANEL
ALK PHOS: 101 U/L (ref 38–126)
ALT: 15 U/L (ref 0–44)
AST: 15 U/L (ref 15–41)
Albumin: 3.1 g/dL — ABNORMAL LOW (ref 3.5–5.0)
Anion gap: 11 (ref 5–15)
BUN: 12 mg/dL (ref 8–23)
CALCIUM: 8.8 mg/dL — AB (ref 8.9–10.3)
CHLORIDE: 102 mmol/L (ref 98–111)
CO2: 26 mmol/L (ref 22–32)
Creatinine, Ser: 0.81 mg/dL (ref 0.44–1.00)
GFR calc non Af Amer: >60 mL/min (ref >60)
Glucose, Bld: 206 mg/dL — ABNORMAL HIGH (ref 70–99)
Potassium: 3.9 mmol/L (ref 3.5–5.1)
SODIUM: 139 mmol/L (ref 135–145)
Total Bilirubin: 0.8 mg/dL (ref 0.3–1.2)
Total Protein: 6 g/dL — ABNORMAL LOW (ref 6.5–8.1)

## 2018-04-25 LAB — I-STAT CHEM 8, ED
BUN: 13 mg/dL (ref 8–23)
CREATININE: 0.7 mg/dL (ref 0.44–1.00)
Calcium, Ion: 0.96 mmol/L — ABNORMAL LOW (ref 1.15–1.40)
Chloride: 101 mmol/L (ref 98–111)
GLUCOSE: 199 mg/dL — AB (ref 70–99)
HEMATOCRIT: 35 % — AB (ref 36.0–46.0)
HEMOGLOBIN: 11.9 g/dL — AB (ref 12.0–15.0)
POTASSIUM: 3.8 mmol/L (ref 3.5–5.1)
Sodium: 139 mmol/L (ref 135–145)
TCO2: 25 mmol/L (ref 22–32)

## 2018-04-25 LAB — CBC
HEMATOCRIT: 35.5 % — AB (ref 36.0–46.0)
HEMOGLOBIN: 10.7 g/dL — AB (ref 12.0–15.0)
MCH: 26.4 pg (ref 26.0–34.0)
MCHC: 30.1 g/dL (ref 30.0–36.0)
MCV: 87.7 fL (ref 78.0–100.0)
Platelets: 317 10*3/uL (ref 150–400)
RBC: 4.05 MIL/uL (ref 3.87–5.11)
RDW: 19.9 % — AB (ref 11.5–15.5)
WBC: 9.4 10*3/uL (ref 4.0–10.5)

## 2018-04-25 LAB — I-STAT TROPONIN, ED: Troponin i, poc: 0 ng/mL (ref 0.00–0.08)

## 2018-04-25 LAB — PROTIME-INR
INR: 1.04
Prothrombin Time: 13.5 seconds (ref 11.4–15.2)

## 2018-04-25 LAB — ETHANOL: Alcohol, Ethyl (B): <10 mg/dL (ref <10)

## 2018-04-25 LAB — CBG MONITORING, ED: Glucose-Capillary: 195 mg/dL — ABNORMAL HIGH (ref 70–99)

## 2018-04-25 LAB — APTT: APTT: 30 s (ref 24–36)

## 2018-04-25 MED ORDER — ACETAMINOPHEN 650 MG RE SUPP: 650.0000 mg | RECTAL | Status: DC | PRN

## 2018-04-25 MED ORDER — STROKE: EARLY STAGES OF RECOVERY BOOK
Freq: Once | Status: AC
  Administered 2018-04-25: 20:00:00

## 2018-04-25 MED ORDER — PREDNISONE 1 MG PO TABS
4.0000 mg | ORAL_TABLET | Freq: Every day | ORAL | Status: DC
  Administered 2018-04-26 – 2018-04-29 (×4): 4 mg via ORAL
  Filled 2018-04-25 (×4): qty 4

## 2018-04-25 MED ORDER — INSULIN ASPART 100 UNIT/ML ~~LOC~~ SOLN
0.0000 [IU] | Freq: Every day | SUBCUTANEOUS | Status: DC
  Administered 2018-04-27: 2 [IU] via SUBCUTANEOUS

## 2018-04-25 MED ORDER — LEVOTHYROXINE SODIUM 100 MCG PO TABS
100.0000 ug | ORAL_TABLET | Freq: Every day | ORAL | Status: DC
Start: 2018-04-26 — End: 2018-04-29
  Administered 2018-04-26 – 2018-04-29 (×4): 100 ug via ORAL
  Filled 2018-04-25 (×4): qty 1

## 2018-04-25 MED ORDER — ACETAMINOPHEN 325 MG PO TABS
650.0000 mg | ORAL_TABLET | ORAL | Status: DC | PRN
  Administered 2018-04-26 – 2018-04-28 (×2): 650 mg via ORAL
  Administered 2018-04-29: 325 mg via ORAL
  Filled 2018-04-25 (×3): qty 2

## 2018-04-25 MED ORDER — MAGNESIUM OXIDE 400 (241.3 MG) MG PO TABS
200.0000 mg | ORAL_TABLET | Freq: Two times a day (BID) | ORAL | Status: DC
  Administered 2018-04-25 – 2018-04-29 (×8): 200 mg via ORAL
  Filled 2018-04-25 (×8): qty 1

## 2018-04-25 MED ORDER — SODIUM CHLORIDE 0.9 % IV SOLN
INTRAVENOUS | Status: AC
  Administered 2018-04-25: 20:00:00 via INTRAVENOUS

## 2018-04-25 MED ORDER — LEFLUNOMIDE 20 MG PO TABS
20.0000 mg | ORAL_TABLET | Freq: Every day | ORAL | Status: DC
  Administered 2018-04-25 – 2018-04-29 (×5): 20 mg via ORAL
  Filled 2018-04-25 (×5): qty 1

## 2018-04-25 MED ORDER — ENOXAPARIN SODIUM 30 MG/0.3ML ~~LOC~~ SOLN
30.0000 mg | SUBCUTANEOUS | Status: DC
  Administered 2018-04-25 – 2018-04-28 (×4): 30 mg via SUBCUTANEOUS
  Filled 2018-04-25 (×4): qty 0.3

## 2018-04-25 MED ORDER — PANTOPRAZOLE SODIUM 40 MG PO TBEC
80.0000 mg | DELAYED_RELEASE_TABLET | Freq: Every day | ORAL | Status: DC
  Administered 2018-04-25 – 2018-04-29 (×5): 80 mg via ORAL
  Filled 2018-04-25 (×5): qty 2

## 2018-04-25 MED ORDER — INSULIN ASPART 100 UNIT/ML ~~LOC~~ SOLN
0.0000 [IU] | Freq: Three times a day (TID) | SUBCUTANEOUS | Status: DC
Start: 2018-04-26 — End: 2018-04-29
  Administered 2018-04-26: 1 [IU] via SUBCUTANEOUS
  Administered 2018-04-26: 2 [IU] via SUBCUTANEOUS
  Administered 2018-04-26 – 2018-04-27 (×4): 3 [IU] via SUBCUTANEOUS
  Administered 2018-04-28: 1 [IU] via SUBCUTANEOUS
  Administered 2018-04-28: 2 [IU] via SUBCUTANEOUS
  Administered 2018-04-28: 1 [IU] via SUBCUTANEOUS
  Administered 2018-04-29: 5 [IU] via SUBCUTANEOUS

## 2018-04-25 MED ORDER — ACETAMINOPHEN 160 MG/5ML PO SOLN: 650.0000 mg | ORAL | Status: DC | PRN

## 2018-04-25 MED ORDER — INSULIN ASPART 100 UNIT/ML ~~LOC~~ SOLN: 0.0000 [IU] | SUBCUTANEOUS | Status: DC

## 2018-04-25 MED ORDER — SENNOSIDES-DOCUSATE SODIUM 8.6-50 MG PO TABS: 1.0000 | ORAL_TABLET | Freq: Every evening | ORAL | Status: DC | PRN

## 2018-04-25 NOTE — H&P (Signed)
History and Physical    [name] OXB:[id number] DOB: [birth year] DOA: 04/25/2018  PCP: Deland Pretty, MD  Patient coming from: Home    Chief Complaint: Dysarthria  HPI: [name] is a 84 y.o. female with medical history significant of Hypothyroidism, HTN, DM2, RA, GERd, Afib who presents for dysarthria.  Unfortunately, patient is alone and unable to give much history. History obtained from discussion with EDP and review of chart.  Apparently, patient presented with dysarthria and facial droop noted by caregiver and was brought to the ED.  Neurology evaluated and initial symptoms were apparently resolved.  Per neurology, she was inattentive and they were concerned for a metabolic cause.  When I evaluated her, she had dysarthria and was impossible to understand mostly.  She did not follow directions, but did answer some questions.  She would speak at length, but it was unclear what she was saying.  It appeared that she had expressive aphasia and also has some processing issues.  Unclear how acute this is and what her baseline is.    ED Course: She had initial labs in the ED which showed Glucose of 206 and some mild anemia which is chronic for her.  She had a CT of her head which showed only chronic changes.  Neurology evaluated her and recommended MRI/MRA of the brain and other standard work up for TIA.    Review of Systems: As per HPI, patient was unable to answer questions due to acute medical issues.   Past Medical History:  Diagnosis Date  . Acute bronchitis   . Anemia, unspecified   . Atrial fibrillation ([place])   . Depressive disorder, not elsewhere classified   . Diverticulosis of colon (without mention of hemorrhage)   . Esophageal reflux   . Family history of malignant neoplasm of gastrointestinal tract   . Functional diarrhea   . Hiatal hernia   . History of blood transfusion    "once; related to diverticulitis" (11/29/2017)  . Other and unspecified hyperlipidemia    . Other chest pain   . Other specified cardiac dysrhythmias(427.89)   . Personal history of colonic polyps 07/17/1995   adenomatous polyps  . PONV (postoperative nausea and vomiting)   . Recurrent UTI (urinary tract infection)    "2-3 times in the last 1 1/66yr" (11/29/2017)  . Rheumatoid arthritis ([place])   . Tachycardia, unspecified   . Type II diabetes mellitus ([place])   . Unspecified adverse effect of unspecified drug, medicinal and biological substance   . Unspecified essential hypertension   . Unspecified hypothyroidism     Past Surgical History:  Procedure Laterality Date  . ABDOMINAL HYSTERECTOMY     "partial"  . CATARACT EXTRACTION Bilateral   . FOREARM FRACTURE SURGERY Right 1940s?  . FRACTURE SURGERY    . ORIF FEMUR FRACTURE  08/30/2012   Procedure: OPEN REDUCTION INTERNAL FIXATION (ORIF) DISTAL FEMUR FRACTURE;  Surgeon: Mauri Pole, MD;  Location: [facility];  Service: Orthopedics;  Laterality: Right;  proximal femur  . THYROIDECTOMY, PARTIAL     I attempted to review with patient, no caregiver in room to assist  reports that she has never smoked. She has never used smokeless tobacco. She reports that she drinks about 0.6 oz of alcohol per week. She reports that she does not use drugs.  No Known Allergies  Family History  Problem Relation Age of Onset  . Colon cancer Maternal Grandmother   Further FH not pertinent to acute care.   Prior  to Admission medications   Medication Sig Start Date End Date Taking? Authorizing Provider  feeding supplement, ENSURE ENLIVE, (ENSURE ENLIVE) LIQD Take 237 mLs by mouth 2 (two) times daily between meals. 02/26/18   Lavina Hamman, MD  ferrous sulfate 325 (65 FE) MG EC tablet Take 1 tablet (325 mg total) by mouth 2 (two) times daily. Patient not taking: Reported on 02/25/2018 12/07/17 12/07/18  Regalado, Jerald Kief A, MD  leflunomide (ARAVA) 20 MG tablet Take 20 mg by mouth daily. 08/14/17   [provider]  levothyroxine (SYNTHROID)  100 MCG tablet Take 1 tablet (100 mcg total) by mouth daily. 12/07/17 12/07/18  Regalado, Belkys A, MD  magnesium oxide (MAG-OX) 400 (241.3 Mg) MG tablet Take 0.5 tablets (200 mg total) by mouth 2 (two) times daily. 12/07/17   Regalado, Belkys A, MD  Melatonin 2.5 MG CAPS Take 1 capsule (2.5 mg total) by mouth at bedtime as needed (for sleep). 12/07/17   Regalado, Belkys A, MD  metFORMIN (GLUCOPHAGE-XR) 500 MG 24 hr tablet Take 500-1,000 mg by mouth 2 (two) times daily. Take 1000 mg by mouth in the morning and 500 mg by mouth in the evening 03/07/15   [provider]  mirtazapine (REMERON) 7.5 MG tablet Take 7.5 mg by mouth at bedtime. 02/03/18   [provider]  omeprazole (PRILOSEC) 40 MG capsule Take 1 capsule (40 mg total) by mouth 2 (two) times daily. 12/07/17   Regalado, Belkys A, MD  predniSONE (DELTASONE) 1 MG tablet Take 4 mg by mouth daily with breakfast.     [provider]  TRADJENTA 5 MG TABS tablet Take 5 mg by mouth daily. 01/29/18   [provider]  TRULICITY 1.16 FB/9.0XY SOPN Inject 0.5 mLs into the skin once a week.  12/17/17   [provider]  VOLTAREN 1 % GEL Apply 2 g topically 4 (four) times daily.  11/30/13   [provider]    Physical Exam:  Constitutional: Thin elderly woman lying in bed, NAD Vitals:   04/25/18 1521 04/25/18 1530 04/25/18 1545 04/25/18 1700  BP:  (!) 152/71 (!) 171/109 (!) 169/97  Pulse:  88 99 77  Resp:  (!) 28 16 20   Temp: 97.8 F (36.6 C)     TempSrc: Oral     SpO2:  96% 97% 93%  Weight:       Eyes: Moving eyes to look around room, tracks, PERRL ENMT: Mucous membranes are dry. Poor dentition Neck: normal, supple, very thin Respiratory: CTAB anteriorly, no wheezing or rales Cardiovascular: RR, NR, no murmur Abdomen: soft, NT, ND, +BS Musculoskeletal: She has impressive deformity with ulnar deviation, related to long standing RA Skin: no rashes, lesions, ulcers. + chronic skin changes  Neurologic:  Unable to determine.  She is spontaneously moving all extremities and continuously verbalizing, though very difficult to understand.  Psychiatric: Alert. Pleasant, no apparent distress  Labs on Admission: I have personally reviewed following labs and imaging studies  CBC: Recent Labs  Lab 04/25/18 1415 04/25/18 1418  WBC 9.4  --   NEUTROABS 7.0  --   HGB 10.7* 11.9*  HCT 35.5* 35.0*  MCV 87.7  --   PLT 317  --    Basic Metabolic Panel: Recent Labs  Lab 04/25/18 1415 04/25/18 1418  NA 139 139  K 3.9 3.8  CL 102 101  CO2 26  --   GLUCOSE 206* 199*  BUN 12 13  CREATININE 0.81 0.70  CALCIUM 8.8*  --  GFR: Estimated Creatinine Clearance: 34.6 mL/min (by C-G formula based on SCr of 0.7 mg/dL). Liver Function Tests: Recent Labs  Lab 04/25/18 1415  AST 15  ALT 15  ALKPHOS 101  BILITOT 0.8  PROT 6.0*  ALBUMIN 3.1*   No results for input(s): LIPASE, AMYLASE in the last 168 hours. No results for input(s): AMMONIA in the last 168 hours. Coagulation Profile: Recent Labs  Lab 04/25/18 1415  INR 1.04   Cardiac Enzymes: No results for input(s): CKTOTAL, CKMB, CKMBINDEX, TROPONINI in the last 168 hours. BNP (last 3 results) No results for input(s): PROBNP in the last 8760 hours. HbA1C: No results for input(s): HGBA1C in the last 72 hours. CBG: Recent Labs  Lab 04/25/18 1411  GLUCAP 195*   Lipid Profile: No results for input(s): CHOL, HDL, LDLCALC, TRIG, CHOLHDL, LDLDIRECT in the last 72 hours. Thyroid Function Tests: No results for input(s): TSH, T4TOTAL, FREET4, T3FREE, THYROIDAB in the last 72 hours. Anemia Panel: No results for input(s): VITAMINB12, FOLATE, FERRITIN, TIBC, IRON, RETICCTPCT in the last 72 hours. Urine analysis:    Component Value Date/Time   COLORURINE YELLOW 03/11/2018 1704   APPEARANCEUR CLEAR 03/11/2018 1704   LABSPEC 1.019 03/11/2018 1704   PHURINE 5.0 03/11/2018 1704   GLUCOSEU NEGATIVE 03/11/2018 1704   HGBUR NEGATIVE 03/11/2018  1704   HGBUR negative 03/31/2009 1047   BILIRUBINUR NEGATIVE 03/11/2018 1704   KETONESUR 5 (A) 03/11/2018 1704   PROTEINUR 30 (A) 03/11/2018 1704   UROBILINOGEN 0.2 08/28/2012 0543   NITRITE NEGATIVE 03/11/2018 1704   LEUKOCYTESUR NEGATIVE 03/11/2018 1704    Radiological Exams on Admission: Mr Jodene Nam Head Wo Contrast  Result Date: 04/25/2018 CLINICAL DATA:  84 year old female with slurred speech and left facial droop, transient symptoms. Diffusion-weighted FLAIR, and T2 MRI imaging requested along with MRA. EXAM: MRI HEAD WITHOUT CONTRAST LIMITED MRA HEAD WITHOUT CONTRAST TECHNIQUE: Multiple pulse sequences of the brain and surrounding structures were obtained without intravenous contrast. Angiographic images of the head were obtained using MRA technique without contrast. COMPARISON:  Head CT without contrast 1426 hours today. Brain MRI 02/25/2018. FINDINGS: MRI HEAD FINDINGS Brain: No restricted diffusion to suggest acute infarction. No midline shift, mass effect, evidence of mass lesion, ventriculomegaly, extra-axial collection or acute intracranial hemorrhage. Cervicomedullary junction and pituitary are within normal limits. Stable cerebral volume. Patchy and confluent bilateral cerebral white matter T2 and FLAIR hyperintensity appears stable since June. No new T2 or FLAIR signal abnormality identified. Vascular: Major intracranial vascular flow voids are stable. Mild generalized intracranial artery tortuosity. Skull and upper cervical spine: Stable visible cervical spine. Visualized bone marrow signal is within normal limits. Sinuses/Orbits: Stable and negative. Other: Mastoid air cells remain clear. Visible scalp and face soft tissues appear negative. MRA HEAD FINDINGS Antegrade flow in the posterior circulation. Mildly dominant distal left vertebral artery. Patent left PICA origin. No significant distal vertebral stenosis. Patent vertebrobasilar junction. Dominant appearing right AICA. Patent basilar  artery without stenosis. Normal SCA and PCA origins. Posterior communicating arteries are diminutive or absent. Bilateral PCA branches are within normal limits. Antegrade flow in both ICA siphons. Tortuous bilateral cavernous segments. No siphon stenosis. Normal ophthalmic artery origins. Patent carotid termini. Normal MCA and ACA origins. Anterior communicating artery and visible ACA branches are within normal limits. Bilateral MCA M1 segments and MCA bifurcations are patent without stenosis. Visible bilateral MCA branches are within normal limits. IMPRESSION: 1.  No acute intracranial abnormality. 2. Stable limited MRI appearance of the brain compared to 02/25/2018. 3. Negative  intracranial MRA; mild for age intracranial atherosclerosis. Electronically Signed   By: Genevie Ann M.D.   On: 04/25/2018 17:05   Mr Brain Wo Contrast  Result Date: 04/25/2018 CLINICAL DATA:  84 year old female with slurred speech and left facial droop, transient symptoms. Diffusion-weighted FLAIR, and T2 MRI imaging requested along with MRA. EXAM: MRI HEAD WITHOUT CONTRAST LIMITED MRA HEAD WITHOUT CONTRAST TECHNIQUE: Multiple pulse sequences of the brain and surrounding structures were obtained without intravenous contrast. Angiographic images of the head were obtained using MRA technique without contrast. COMPARISON:  Head CT without contrast 1426 hours today. Brain MRI 02/25/2018. FINDINGS: MRI HEAD FINDINGS Brain: No restricted diffusion to suggest acute infarction. No midline shift, mass effect, evidence of mass lesion, ventriculomegaly, extra-axial collection or acute intracranial hemorrhage. Cervicomedullary junction and pituitary are within normal limits. Stable cerebral volume. Patchy and confluent bilateral cerebral white matter T2 and FLAIR hyperintensity appears stable since June. No new T2 or FLAIR signal abnormality identified. Vascular: Major intracranial vascular flow voids are stable. Mild generalized intracranial artery  tortuosity. Skull and upper cervical spine: Stable visible cervical spine. Visualized bone marrow signal is within normal limits. Sinuses/Orbits: Stable and negative. Other: Mastoid air cells remain clear. Visible scalp and face soft tissues appear negative. MRA HEAD FINDINGS Antegrade flow in the posterior circulation. Mildly dominant distal left vertebral artery. Patent left PICA origin. No significant distal vertebral stenosis. Patent vertebrobasilar junction. Dominant appearing right AICA. Patent basilar artery without stenosis. Normal SCA and PCA origins. Posterior communicating arteries are diminutive or absent. Bilateral PCA branches are within normal limits. Antegrade flow in both ICA siphons. Tortuous bilateral cavernous segments. No siphon stenosis. Normal ophthalmic artery origins. Patent carotid termini. Normal MCA and ACA origins. Anterior communicating artery and visible ACA branches are within normal limits. Bilateral MCA M1 segments and MCA bifurcations are patent without stenosis. Visible bilateral MCA branches are within normal limits. IMPRESSION: 1.  No acute intracranial abnormality. 2. Stable limited MRI appearance of the brain compared to 02/25/2018. 3. Negative intracranial MRA; mild for age intracranial atherosclerosis. Electronically Signed   By: Genevie Ann M.D.   On: 04/25/2018 17:05   Ct Head Code Stroke Wo Contrast  Result Date: 04/25/2018 CLINICAL DATA:  Code stroke. Slurred speech and altered mental status. Code stroke EXAM: CT HEAD WITHOUT CONTRAST TECHNIQUE: Contiguous axial images were obtained from the base of the skull through the vertex without intravenous contrast. COMPARISON:  03/11/2018 FINDINGS: Brain: No evidence of acute infarction, hemorrhage, hydrocephalus, extra-axial collection or mass lesion/mass effect. Generalized atrophy. Extensive low-density in the cerebral white matter attributed to chronic small vessel ischemia. Vascular: Atherosclerotic calcification.  No  hyperdense vessel Skull: Negative for fracture Sinuses/Orbits: Bilateral cataract resection Other: These results were communicated to Dr. Cheral Marker at 2:31 pmon 8/2/2019by text page via the [facility] messaging system. ASPECTS [facility] Stroke Program Early CT Score) - Ganglionic level infarction (caudate, lentiform nuclei, internal capsule, insula, M1-M3 cortex): 7 - Supraganglionic infarction (M4-M6 cortex): 3 Total score (0-10 with 10 being normal): 10 IMPRESSION: 1. No acute finding. 2. Generalized atrophy and chronic small vessel ischemia. Electronically Signed   By: Monte Fantasia M.D.   On: 04/25/2018 14:32    EKG: Independently reviewed. Sinus with PVCs, wandering baseline  Assessment/Plan TIA, acute neurological symptoms - Unclear baseline, but she has severe dysarthria.  CVA vs. TIA vs. Encephalopathy - Check UA and UC - If fever, check BC - WBC WNL and no report of recent infectious concerns - Stroke work up with MRI/MRA,  carotid dopplers TTE - A1C Lipid panel - Monitor for changes with neuro checks - aspirin 81mg   - PT/OT/SLP - high intensity statin may outweigh benefit - Stroke swallow screen - Telemetry - Hold remeron and melatonin given possible encephalopathy   Hypothyroidism - Check TSH, continue synthroid    Type 2 diabetes mellitus with other specified complication - Hold trulicity and tradjenta until eating, hold metformin - SSI    Essential hypertension - Allow permissive HTN - does not appear to be on any medications at this time    Rheumatoid arthritis 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

## 2018-04-25 NOTE — ED Provider Notes (Signed)
[facility] EMERGENCY DEPARTMENT Provider Note   CSN: [id number] Arrival date & time: 04/25/18  1409     History   Chief Complaint No chief complaint on file.   HPI [name] is a 84 y.o. female.  84 yo F with a cc of right sided facial droop and aphasia.  Limited history due to aphasia.  Level V caveat aphasia.   The history is provided by the patient.  Illness  This is a new problem. The current episode started yesterday. The problem occurs constantly. The problem has not changed since onset.Associated symptoms include chest pain. Pertinent negatives include no headaches and no shortness of breath. Nothing aggravates the symptoms. Nothing relieves the symptoms. She has tried nothing for the symptoms. The treatment provided no relief.    Past Medical History:  Diagnosis Date  . Acute bronchitis   . Anemia, unspecified   . Atrial fibrillation ([place])   . Depressive disorder, not elsewhere classified   . Diverticulosis of colon (without mention of hemorrhage)   . Esophageal reflux   . Family history of malignant neoplasm of gastrointestinal tract   . Functional diarrhea   . Hiatal hernia   . History of blood transfusion    "once; related to diverticulitis" (11/29/2017)  . Other and unspecified hyperlipidemia   . Other chest pain   . Other specified cardiac dysrhythmias(427.89)   . Personal history of colonic polyps 07/17/1995   adenomatous polyps  . PONV (postoperative nausea and vomiting)   . Recurrent UTI (urinary tract infection)    "2-3 times in the last 1 1/60yr" (11/29/2017)  . Rheumatoid arthritis ([place])   . Tachycardia, unspecified   . Type II diabetes mellitus ([place])   . Unspecified adverse effect of unspecified drug, medicinal and biological substance   . Unspecified essential hypertension   . Unspecified hypothyroidism     Patient Active Problem List   Diagnosis Date Noted  . Pressure injury of skin 02/26/2018  . Hyperkalemia  02/25/2018  . Altered mental status 02/25/2018  . Acute lower UTI 02/25/2018  . Iron deficiency anemia due to chronic blood loss 12/17/2017  . Protein-calorie malnutrition, severe 11/30/2017  . Syncope 11/29/2017  . Diabetic ulcer of left foot associated with type 2 diabetes mellitus ([place]) 08/11/2015  . Closed right hip fracture ([place]) 08/28/2012  . History of gastroesophageal reflux (GERD) 06/26/2011  . Lower GI bleeding 06/26/2011  . DEPRESSION 03/31/2009  . DIARRHEA, FUNCTIONAL 03/31/2009  . DIABETIC HYPOGLYCEMIA, TYPE II 11/03/2008  . ACUTE BRONCHITIS 08/23/2008  . AV NODAL REENTRY TACHYCARDIA 10/29/2007  . TACHYCARDIA 10/29/2007  . CHEST PAIN, ATYPICAL 10/25/2007  . ADVEF, DRUG/MEDICINAL/BIOLOGICAL SUBST NOS 07/16/2007  . Nonspecific (abnormal) findings on radiological and other examination of body structure 04/03/2007  . CHEST XRAY, ABNORMAL 04/03/2007  . Hypothyroidism 03/06/2007  . Type 2 diabetes mellitus with other specified complication ([place]) 40/98/1191  . HYPERLIPIDEMIA 03/06/2007  . Essential hypertension 03/06/2007  . DIVERTICULOSIS, COLON 03/06/2007  . Rheumatoid arthritis involving both hands with positive rheumatoid factor ([place]) 03/06/2007    Past Surgical History:  Procedure Laterality Date  . ABDOMINAL HYSTERECTOMY     "partial"  . CATARACT EXTRACTION Bilateral   . FOREARM FRACTURE SURGERY Right 1940s?  . FRACTURE SURGERY    . ORIF FEMUR FRACTURE  08/30/2012   Procedure: OPEN REDUCTION INTERNAL FIXATION (ORIF) DISTAL FEMUR FRACTURE;  Surgeon: Mauri Pole, MD;  Location: [facility] ORS;  Service: Orthopedics;  Laterality: Right;  proximal femur  . THYROIDECTOMY,  PARTIAL       OB History   None      Home Medications    Prior to Admission medications   Medication Sig Start Date End Date Taking? Authorizing Provider  feeding supplement, ENSURE ENLIVE, (ENSURE ENLIVE) LIQD Take 237 mLs by mouth 2 (two) times daily between meals. 02/26/18   Lavina Hamman, MD    ferrous sulfate 325 (65 FE) MG EC tablet Take 1 tablet (325 mg total) by mouth 2 (two) times daily. Patient not taking: Reported on 02/25/2018 12/07/17 12/07/18  Regalado, Jerald Kief A, MD  leflunomide (ARAVA) 20 MG tablet Take 20 mg by mouth daily. 08/14/17   [provider]  levothyroxine (SYNTHROID) 100 MCG tablet Take 1 tablet (100 mcg total) by mouth daily. 12/07/17 12/07/18  Regalado, Belkys A, MD  magnesium oxide (MAG-OX) 400 (241.3 Mg) MG tablet Take 0.5 tablets (200 mg total) by mouth 2 (two) times daily. 12/07/17   Regalado, Belkys A, MD  Melatonin 2.5 MG CAPS Take 1 capsule (2.5 mg total) by mouth at bedtime as needed (for sleep). 12/07/17   Regalado, Belkys A, MD  metFORMIN (GLUCOPHAGE-XR) 500 MG 24 hr tablet Take 500-1,000 mg by mouth 2 (two) times daily. Take 1000 mg by mouth in the morning and 500 mg by mouth in the evening 03/07/15   [provider]  mirtazapine (REMERON) 7.5 MG tablet Take 7.5 mg by mouth at bedtime. 02/03/18   [provider]  omeprazole (PRILOSEC) 40 MG capsule Take 1 capsule (40 mg total) by mouth 2 (two) times daily. 12/07/17   Regalado, Belkys A, MD  predniSONE (DELTASONE) 1 MG tablet Take 4 mg by mouth daily with breakfast.     [provider]  TRADJENTA 5 MG TABS tablet Take 5 mg by mouth daily. 01/29/18   [provider]  TRULICITY 2.35 TI/1.4ER SOPN Inject 0.5 mLs into the skin once a week.  12/17/17   [provider]  VOLTAREN 1 % GEL Apply 2 g topically 4 (four) times daily.  11/30/13   [provider]    Family History Family History  Problem Relation Age of Onset  . Colon cancer Maternal Grandmother     Social History Social History   Tobacco Use  . Smoking status: Never Smoker  . Smokeless tobacco: Never Used  Substance Use Topics  . Alcohol use: Yes    Alcohol/week: 0.6 oz    Types: 1 Glasses of wine per week  . Drug use: No     Allergies   Patient has no known allergies.   Review of  Systems Review of Systems  Constitutional: Negative for chills and fever.  HENT: Negative for congestion and rhinorrhea.   Eyes: Negative for redness and visual disturbance.  Respiratory: Negative for shortness of breath and wheezing.   Cardiovascular: Positive for chest pain. Negative for palpitations.  Gastrointestinal: Negative for nausea and vomiting.  Genitourinary: Negative for dysuria and urgency.  Musculoskeletal: Negative for arthralgias and myalgias.  Skin: Negative for pallor and wound.  Neurological: Positive for speech difficulty. Negative for dizziness and headaches.     Physical Exam Updated Vital Signs BP (!) 171/109   Pulse 99   Temp 97.8 F (36.6 C) (Oral)   Resp 16   Wt 44.3 kg (97 lb 10.6 oz)   SpO2 97%   BMI 16.25 kg/m   Physical Exam  Constitutional: She appears well-developed and well-nourished. No distress.  HENT:  Head: Normocephalic and atraumatic.  Eyes: Pupils  are equal, round, and reactive to light. EOM are normal.  Neck: Normal range of motion. Neck supple.  Cardiovascular: Normal rate and regular rhythm. Exam reveals no gallop and no friction rub.  No murmur heard. Pulmonary/Chest: Effort normal. She has no wheezes. She has no rales.  Abdominal: Soft. She exhibits no distension. There is no tenderness.  Musculoskeletal: She exhibits no edema or tenderness.  Neurological: She is alert.  Confused, slurred speech, difficulty finding words.  R sided facial droop  Skin: Skin is warm and dry. She is not diaphoretic.  Psychiatric: She has a normal mood and affect. Her behavior is normal.  Nursing note and vitals reviewed.    ED Treatments / Results  Labs (all labs ordered are listed, but only abnormal results are displayed) Labs Reviewed  CBC - Abnormal; Notable for the following components:      Result Value   Hemoglobin 10.7 (*)    HCT 35.5 (*)    RDW 19.9 (*)    All other components within normal limits  COMPREHENSIVE METABOLIC PANEL  - Abnormal; Notable for the following components:   Glucose, Bld 206 (*)    Calcium 8.8 (*)    Total Protein 6.0 (*)    Albumin 3.1 (*)    All other components within normal limits  CBG MONITORING, ED - Abnormal; Notable for the following components:   Glucose-Capillary 195 (*)    All other components within normal limits  I-STAT CHEM 8, ED - Abnormal; Notable for the following components:   Glucose, Bld 199 (*)    Calcium, Ion 0.96 (*)    Hemoglobin 11.9 (*)    HCT 35.0 (*)    All other components within normal limits  ETHANOL  PROTIME-INR  APTT  DIFFERENTIAL  RAPID URINE DRUG SCREEN, HOSP PERFORMED  URINALYSIS, ROUTINE W REFLEX MICROSCOPIC  I-STAT TROPONIN, ED    EKG EKG Interpretation  Date/Time:  Friday April 25 2018 14:54:13 EDT Ventricular Rate:  95 PR Interval:    QRS Duration: 84 QT Interval:  373 QTC Calculation: 452 R Axis:   71 Text Interpretation:  Sinus rhythm Multiple premature complexes, vent & supraven Sinus pause LVH with secondary repolarization abnormality No significant change since last tracing Confirmed by Deno Etienne [phone]) on 04/25/2018 3:00:03 PM   Radiology Ct Head Code Stroke Wo Contrast  Result Date: 04/25/2018 CLINICAL DATA:  Code stroke. Slurred speech and altered mental status. Code stroke EXAM: CT HEAD WITHOUT CONTRAST TECHNIQUE: Contiguous axial images were obtained from the base of the skull through the vertex without intravenous contrast. COMPARISON:  03/11/2018 FINDINGS: Brain: No evidence of acute infarction, hemorrhage, hydrocephalus, extra-axial collection or mass lesion/mass effect. Generalized atrophy. Extensive low-density in the cerebral white matter attributed to chronic small vessel ischemia. Vascular: Atherosclerotic calcification.  No hyperdense vessel Skull: Negative for fracture Sinuses/Orbits: Bilateral cataract resection Other: These results were communicated to Dr. Cheral Marker at 2:31 pmon 8/2/2019by text page via the [facility]  messaging system. ASPECTS [facility] Stroke Program Early CT Score) - Ganglionic level infarction (caudate, lentiform nuclei, internal capsule, insula, M1-M3 cortex): 7 - Supraganglionic infarction (M4-M6 cortex): 3 Total score (0-10 with 10 being normal): 10 IMPRESSION: 1. No acute finding. 2. Generalized atrophy and chronic small vessel ischemia. Electronically Signed   By: Monte Fantasia M.D.   On: 04/25/2018 14:32    Procedures Procedures (including critical care time)  Medications Ordered in ED Medications - No data to display   Initial Impression / Assessment and Plan / ED  Course  I have reviewed the triage vital signs and the nursing notes.  Pertinent labs & imaging results that were available during my care of the patient were reviewed by me and considered in my medical decision making (see chart for details).     84 yo F with a chief complaint of right-sided facial droop and aphasia.  The patient was made a code stroke and went back to CT.  Decision not to give TPA with improving symptoms but felt that the patient warranted an overnight stay for tia workup.    The patients results and plan were reviewed and discussed.   Any x-rays performed were independently reviewed by myself.   Differential diagnosis were considered with the presenting HPI.  Medications - No data to display  Vitals:   04/25/18 1517 04/25/18 1521 04/25/18 1530 04/25/18 1545  BP: (!) 141/89  (!) 152/71 (!) 171/109  Pulse: 86  88 99  Resp: 18  (!) 28 16  Temp:  97.8 F (36.6 C)    TempSrc:  Oral    SpO2: 95%  96% 97%  Weight:        Final diagnoses:  Aphasia  Facial droop    Admission/ observation were discussed with the admitting physician, patient and/or family and they are comfortable with the plan.   Final Clinical Impressions(s) / ED Diagnoses   Final diagnoses:  Aphasia  Facial droop    ED Discharge Orders    None       Deno Etienne, DO 04/25/18 1622

## 2018-04-25 NOTE — ED Notes (Signed)
Code stroke cancelled at 1702

## 2018-04-25 NOTE — ED Notes (Signed)
Pt transported to MRI 

## 2018-04-25 NOTE — Code Documentation (Signed)
84yo female arriving to [facility] via [place] at 36. Patient from home where her caregiver noted confusion at 1334. EMS was called and activated a code stroke for slurred speech and left facial droop. Stroke team at the bedside on patient arrival. Labs drawn and patient to CT with team. CT completed. NIHSS 4, see documentation for details and code stroke times. Patient unable to state the month with bilateral leg drift and mild dysarthria on exam. Of note, patient requiring redirection to complete exam. No acute stroke treatment at this time per Dr. Cheral Marker. Patient remains in the window to treat with tPA until 1804 should symptoms worsen. Bedside handoff with ED RN Hannie.

## 2018-04-25 NOTE — Progress Notes (Signed)
Called by ED RN regarding patient with worsening dysarthria. Stroke RN to the bedside to assess patient. NIHSS 3 d/t unable to state month and moderate to severe dysarthria. At times able to understand patient but other times not. Dr. Cheral Marker called and made aware of changes in exam. Order for MRI and MRA which were placed. Patient transported to MRI with ED RN, Hannie. MRI DWI negative per Dr. Cheral Marker. Code stroke canceled.

## 2018-04-25 NOTE — ED Notes (Signed)
[name] jewelry removed (1 ring, bracelet, watch, necklace, earrings) and placed in labeled containers; containers placed in pt's purse

## 2018-04-25 NOTE — ED Notes (Addendum)
Pt from home via EMS; per pt's home health aid, pt was about to taken a nap when aid noticed L sided facial droop and slurred speech; EMS states pt not making sense; pt has hx dementia, unable to establish mental status baseline; pt alert and oriented to person, time, and place, but not situation

## 2018-04-25 NOTE — Code Documentation (Signed)
84yo female arriving to [facility] via [facility] at 15. Patient from home where her caregiver noted confusion at 1334. EMS was called and activated a code stroke for slurred speech and left facial droop. Stroke team at the bedside on patient arrival. Labs drawn and patient to CT with team. CT completed. NIHSS 4, see documentation for details and code stroke times. Patient unable to state the month with bilateral leg drift and mild dysarthria on exam. Of note, patient requiring redirection to complete exam. No acute stroke treatment at this time per Dr. Cheral Marker. Patient remains in the window to treat with tPA until 1804 should symptoms worsen. Bedside handoff with ED RN Hannie.

## 2018-04-25 NOTE — Consult Note (Signed)
NEURO HOSPITALIST      Requesting Physician: Dr. Ralene Bathe    Chief Complaint: Slurred speech,  Left facial droop  History obtained from:  Patient /  Chart   HPI:                                                                                                                                         [name] is an 84 y.o. female PMH of HTN, A. Fib (not on any anticoagulation), DM who presents to [facility] as a code stroke for slurred speech and left facial droop.   Per EMS patient was at home with the home health aide, who noticed a sudden onset of left facial droop with slurred speech and confusion. Her speech and facial droop had improved by arrival to [facility] EMS was called and code stroke was activated. History is limited d/t patient being easily distracted and lack of focus.   ED course:  BP: 149/116 and BG 211, Hgb: 10.7, Hct: 35.5,  CT head: no acute finding; generalized atrophy and chronic small vessel ischemia  No previous stroke history noted   Date last known well: Date: 04/25/2018 Time last known well: Time: 13:34 tPA Given: no; contraindicated. Too mild to treat with no definite lateralizing findings on exam  Modified Rankin: Rankin Score=2  NIHSS: 4 1a Level of Conscious:0 1b LOC Questions: 1 1c LOC Commands: 0 2 Best Gaze:0  3 Visual: 0 4 Facial Palsy: 0 5a Motor Arm - left: 0 5b Motor Arm - Right: 0 6a Motor Leg - Left: 1 6b Motor Leg - Right: 1 7 Limb Ataxia:0  8 Sensory: 0 9 Best Language:0 10 Dysarthria:1 11 Extinct. and Inattention:0 TOTAL: 4   Past Medical History:  Diagnosis Date  . Acute bronchitis   . Anemia, unspecified   . Atrial fibrillation ([place])   . Depressive disorder, not elsewhere classified   . Diverticulosis of colon (without mention of hemorrhage)   . Esophageal reflux   . Family history of malignant neoplasm of gastrointestinal tract   . Functional diarrhea   . Hiatal hernia   .  History of blood transfusion    "once; related to diverticulitis" (11/29/2017)  . Other and unspecified hyperlipidemia   . Other chest pain   . Other specified cardiac dysrhythmias(427.89)   . Personal history of colonic polyps 07/17/1995   adenomatous polyps  . PONV (postoperative nausea and vomiting)   . Recurrent UTI (urinary tract infection)    "2-3 times in the last 1 1/86yr" (11/29/2017)  . Rheumatoid arthritis ([place])   . Tachycardia, unspecified   . Type II diabetes mellitus (  [place])   . Unspecified adverse effect of unspecified drug, medicinal and biological substance   . Unspecified essential hypertension   . Unspecified hypothyroidism     Past Surgical History:  Procedure Laterality Date  . ABDOMINAL HYSTERECTOMY     "partial"  . CATARACT EXTRACTION Bilateral   . FOREARM FRACTURE SURGERY Right 1940s?  . FRACTURE SURGERY    . ORIF FEMUR FRACTURE  08/30/2012   Procedure: OPEN REDUCTION INTERNAL FIXATION (ORIF) DISTAL FEMUR FRACTURE;  Surgeon: Mauri Pole, MD;  Location: [facility] ORS;  Service: Orthopedics;  Laterality: Right;  proximal femur  . THYROIDECTOMY, PARTIAL      Family History  Problem Relation Age of Onset  . Colon cancer Maternal Grandmother        Social History:  reports that she has never smoked. She has never used smokeless tobacco. She reports that she drinks about 0.6 oz of alcohol per week. She reports that she does not use drugs.  Allergies: No Known Allergies  Medications:                                                                                                                           No current facility-administered medications for this encounter.    Current Outpatient Medications  Medication Sig Dispense Refill  . feeding supplement, ENSURE ENLIVE, (ENSURE ENLIVE) LIQD Take 237 mLs by mouth 2 (two) times daily between meals. 60 Bottle 0  . ferrous sulfate 325 (65 FE) MG EC tablet Take 1 tablet (325 mg total) by mouth 2 (two) times daily. (Patient  not taking: Reported on 02/25/2018) 60 tablet 3  . leflunomide (ARAVA) 20 MG tablet Take 20 mg by mouth daily.    Marland Kitchen levothyroxine (SYNTHROID) 100 MCG tablet Take 1 tablet (100 mcg total) by mouth daily. 30 tablet 11  . magnesium oxide (MAG-OX) 400 (241.3 Mg) MG tablet Take 0.5 tablets (200 mg total) by mouth 2 (two) times daily. 10 tablet 0  . Melatonin 2.5 MG CAPS Take 1 capsule (2.5 mg total) by mouth at bedtime as needed (for sleep). 30 capsule 0  . metFORMIN (GLUCOPHAGE-XR) 500 MG 24 hr tablet Take 500-1,000 mg by mouth 2 (two) times daily. Take 1000 mg by mouth in the morning and 500 mg by mouth in the evening    . mirtazapine (REMERON) 7.5 MG tablet Take 7.5 mg by mouth at bedtime.    Marland Kitchen omeprazole (PRILOSEC) 40 MG capsule Take 1 capsule (40 mg total) by mouth 2 (two) times daily. 60 capsule 0  . predniSONE (DELTASONE) 1 MG tablet Take 4 mg by mouth daily with breakfast.     . TRADJENTA 5 MG TABS tablet Take 5 mg by mouth daily.    . TRULICITY 9.44 HQ/7.5FF SOPN Inject 0.5 mLs into the skin once a week.     . VOLTAREN 1 % GEL Apply 2 g topically 4 (four) times daily.       ROS:  History obtained from the patient  General ROS: negative for - chills, fatigue, fever, night sweats, weight gain or weight loss Ophthalmic ROS: negative for - blurry vision, double vision, eye pain or loss of vision Respiratory ROS: negative for - cough,  shortness of breath or wheezing Cardiovascular ROS: negative for - chest pain, dyspnea on exertion,  Musculoskeletal ROS: negative for - joint swelling or muscular weakness Neurological ROS: as noted in HPI   General Examination:                                                                                                      Weight 44.3 kg (97 lb 10.6 oz).  HEENT-  Normocephalic, no lesions, without obvious abnormality.   Normal external eye and conjunctiva.  Cardiovascular-  pulses palpable throughout   Lungs-no excessive working breathing.  Saturations within normal limits on RA Abdomen-BS present x 4 Extremities- Warm, dry and intact Musculoskeletal- patient with bilateral Bouchard nodes. Patient has RA. Skin-warm and dry, patient has a wound on a bunion on left foot. Foot in boot.  Neurological Examination Mental Status: Alert, oriented, to age/situation. Patient needs frequent re-directing. Speech fluent with intact comprehension and naming, but confusion intermittently noted. Mild dysarthria also noted.  Able to follow most simple commands, some requiring repeated requests and/or coaching. Has difficulty with complex motor commands, more consistent with cognitive impairment than receptive aphasia.  Cranial Nerves: II:  Blinks to threat bilaterally. PERRL.  III,IV, VI: ptosis not present, EOM are full. No nystagmus.  V,VII: Smile symmetric with no facial droop noted. Asymmetric application of lipstick is noted. Facial light touch sensation intact bilaterally VIII: HOH IX,X: No hoarseness or hypophonia XI: Symmetric XII: midline tongue extension Motor: Right : Upper extremity   5/5    Left:     Upper extremity   5/5  Lower extremity   4/5     Lower extremity   4/5 Diffusely decreased muscle bulk x 4.  Sensory: Temp and light touch intact proximal limbs x 4.  Deep Tendon Reflexes: 2+ and symmetric biceps, 1+ patellae bilaterally Cerebellar: No ataxia with FNF bilaterally. HKS unable to perform; but probably d/t her distraction and lack of understanding of directions Gait: deferred due to acuity of presentation   Lab Results: Basic Metabolic Panel: No results for input(s): NA, K, CL, CO2, GLUCOSE, BUN, CREATININE, CALCIUM, MG, PHOS in the last 168 hours.  CBC: No results for input(s): WBC, NEUTROABS, HGB, HCT, MCV, PLT in the last 168 hours.  Lipid Panel: No results for input(s): CHOL, TRIG, HDL,  CHOLHDL, VLDL, LDLCALC in the last 168 hours.  CBG: Recent Labs  Lab 04/25/18 1411  GLUCAP 195*    Imaging: Ct Head Code Stroke Wo Contrast  Result Date: 04/25/2018 CLINICAL DATA:  Code stroke. Slurred speech and altered mental status. Code stroke EXAM: CT HEAD WITHOUT CONTRAST TECHNIQUE: Contiguous axial images were obtained from the base of the skull through the vertex without intravenous contrast. COMPARISON:  03/11/2018 FINDINGS: Brain: No evidence of acute infarction, hemorrhage, hydrocephalus, extra-axial collection or mass lesion/mass effect. Generalized  atrophy. Extensive low-density in the cerebral white matter attributed to chronic small vessel ischemia. Vascular: Atherosclerotic calcification.  No hyperdense vessel Skull: Negative for fracture Sinuses/Orbits: Bilateral cataract resection Other: These results were communicated to Dr. Cheral Marker at 2:31 pmon 8/2/2019by text page via the [facility] messaging system. ASPECTS [facility] Stroke Program Early CT Score) - Ganglionic level infarction (caudate, lentiform nuclei, internal capsule, insula, M1-M3 cortex): 7 - Supraganglionic infarction (M4-M6 cortex): 3 Total score (0-10 with 10 being normal): 10 IMPRESSION: 1. No acute finding. 2. Generalized atrophy and chronic small vessel ischemia. Electronically Signed   By: Monte Fantasia M.D.   On: 04/25/2018 14:32     Laurey Morale, MSN, NP-C [facility] [phone] 04/25/2018, 2:15 PM    Assessment: 84 y.o. female with  PMH of HTN, A-Fib (not on any anticoagulation), DM who presents to [facility] as a code stroke for slurred speech and left facial droop. CT head negative for any acute abnormality. Patient seems to have transient symptoms as no facial droop was noted and per EMS speech had improved by arrival to [facility]. Has difficulty with complex motor commands, in a pattern more consistent with cognitive impairment than receptive aphasia.  1. Stroke vs TIA- further stroke work- up needed 2.  Possible mild acute onset AMS. Will need toxic/metabolic/infectious workup  3. Stroke Risk Factors - atrial fibrillation, diabetes mellitus, hypertension and age   Recommendations: --BP goal : Given advanced age and fraily, use modified permissive HTN protocol with treatment of BP is SBP > 180 or DBP > 100  --MRI Brain  --MRA of the head w/o contrast  --Carotid ultrasound --Echocardiogram -- ASA 81 mg -- High intensity Statin benefits most likely outweighed by risks given advanced age -- HgbA1c, fasting lipid panel -- PT consult, OT consult, Speech consult --Telemetry monitoring --Frequent neuro checks --Stroke swallow screen   --Please page stroke NP  Or  PA  Or MD from 8am -4 pm  as this patient from this time will be  followed by the stroke.   You can look them up on [email or web address]  Password [email or web address]  I have seen and examined the patient. I have amended the assessment and plan above. Electronically signed: Dr. Kerney Elbe

## 2018-04-25 NOTE — ED Notes (Signed)
Paged neurology regarding pt's increased dysarthria

## 2018-04-25 NOTE — ED Notes (Signed)
Spoke w/ Dr. Cheral Marker regarding pt's increased dysarthria; Stroke team RN Janett Billow) at bedside

## 2018-04-25 NOTE — ED Notes (Signed)
Attempted report 

## 2018-04-26 ENCOUNTER — Inpatient Hospital Stay (HOSPITAL_COMMUNITY): Payer: Medicare Other

## 2018-04-26 ENCOUNTER — Observation Stay (HOSPITAL_COMMUNITY): Payer: Medicare Other

## 2018-04-26 DIAGNOSIS — F329 Major depressive disorder, single episode, unspecified: Secondary | ICD-10-CM | POA: Diagnosis present

## 2018-04-26 DIAGNOSIS — I10 Essential (primary) hypertension: Secondary | ICD-10-CM | POA: Diagnosis present

## 2018-04-26 DIAGNOSIS — Z90710 Acquired absence of both cervix and uterus: Secondary | ICD-10-CM | POA: Diagnosis not present

## 2018-04-26 DIAGNOSIS — I350 Nonrheumatic aortic (valve) stenosis: Secondary | ICD-10-CM | POA: Diagnosis present

## 2018-04-26 DIAGNOSIS — R29810 Facial weakness: Secondary | ICD-10-CM | POA: Diagnosis present

## 2018-04-26 DIAGNOSIS — G459 Transient cerebral ischemic attack, unspecified: Secondary | ICD-10-CM

## 2018-04-26 DIAGNOSIS — Z993 Dependence on wheelchair: Secondary | ICD-10-CM | POA: Diagnosis not present

## 2018-04-26 DIAGNOSIS — I4891 Unspecified atrial fibrillation: Secondary | ICD-10-CM | POA: Diagnosis present

## 2018-04-26 DIAGNOSIS — M05741 Rheumatoid arthritis with rheumatoid factor of right hand without organ or systems involvement: Secondary | ICD-10-CM | POA: Diagnosis not present

## 2018-04-26 DIAGNOSIS — J181 Lobar pneumonia, unspecified organism: Secondary | ICD-10-CM | POA: Diagnosis present

## 2018-04-26 DIAGNOSIS — I493 Ventricular premature depolarization: Secondary | ICD-10-CM | POA: Diagnosis present

## 2018-04-26 DIAGNOSIS — I340 Nonrheumatic mitral (valve) insufficiency: Secondary | ICD-10-CM | POA: Diagnosis not present

## 2018-04-26 DIAGNOSIS — J690 Pneumonitis due to inhalation of food and vomit: Secondary | ICD-10-CM | POA: Diagnosis present

## 2018-04-26 DIAGNOSIS — E1169 Type 2 diabetes mellitus with other specified complication: Secondary | ICD-10-CM | POA: Diagnosis present

## 2018-04-26 DIAGNOSIS — R0600 Dyspnea, unspecified: Secondary | ICD-10-CM | POA: Diagnosis not present

## 2018-04-26 DIAGNOSIS — Z87440 Personal history of urinary (tract) infections: Secondary | ICD-10-CM | POA: Diagnosis not present

## 2018-04-26 DIAGNOSIS — K219 Gastro-esophageal reflux disease without esophagitis: Secondary | ICD-10-CM | POA: Diagnosis present

## 2018-04-26 DIAGNOSIS — R471 Dysarthria and anarthria: Secondary | ICD-10-CM | POA: Diagnosis present

## 2018-04-26 DIAGNOSIS — Z86010 Personal history of colonic polyps: Secondary | ICD-10-CM | POA: Diagnosis not present

## 2018-04-26 DIAGNOSIS — G9341 Metabolic encephalopathy: Secondary | ICD-10-CM | POA: Diagnosis present

## 2018-04-26 DIAGNOSIS — E039 Hypothyroidism, unspecified: Secondary | ICD-10-CM | POA: Diagnosis not present

## 2018-04-26 DIAGNOSIS — E860 Dehydration: Secondary | ICD-10-CM | POA: Diagnosis present

## 2018-04-26 DIAGNOSIS — J90 Pleural effusion, not elsewhere classified: Secondary | ICD-10-CM | POA: Diagnosis not present

## 2018-04-26 DIAGNOSIS — E785 Hyperlipidemia, unspecified: Secondary | ICD-10-CM | POA: Diagnosis present

## 2018-04-26 DIAGNOSIS — M069 Rheumatoid arthritis, unspecified: Secondary | ICD-10-CM | POA: Diagnosis present

## 2018-04-26 DIAGNOSIS — R4701 Aphasia: Secondary | ICD-10-CM | POA: Diagnosis present

## 2018-04-26 DIAGNOSIS — E876 Hypokalemia: Secondary | ICD-10-CM | POA: Diagnosis present

## 2018-04-26 DIAGNOSIS — M05742 Rheumatoid arthritis with rheumatoid factor of left hand without organ or systems involvement: Secondary | ICD-10-CM | POA: Diagnosis not present

## 2018-04-26 DIAGNOSIS — D649 Anemia, unspecified: Secondary | ICD-10-CM | POA: Diagnosis present

## 2018-04-26 DIAGNOSIS — J189 Pneumonia, unspecified organism: Secondary | ICD-10-CM | POA: Diagnosis present

## 2018-04-26 DIAGNOSIS — J9811 Atelectasis: Secondary | ICD-10-CM | POA: Diagnosis not present

## 2018-04-26 LAB — HEMOGLOBIN A1C
Hgb A1c MFr Bld: 6.4 % — ABNORMAL HIGH (ref 4.8–5.6)
Mean Plasma Glucose: 136.98 mg/dL

## 2018-04-26 LAB — LIPID PANEL
CHOL/HDL RATIO: 3.6 ratio
Cholesterol: 143 mg/dL (ref 0–200)
HDL: 40 mg/dL — AB (ref >40)
LDL CALC: 68 mg/dL (ref 0–99)
Triglycerides: 173 mg/dL — ABNORMAL HIGH (ref <150)
VLDL: 35 mg/dL (ref 0–40)

## 2018-04-26 LAB — ECHOCARDIOGRAM COMPLETE
HEIGHTINCHES: 62 in
WEIGHTICAEL: 1555.57 [oz_av]

## 2018-04-26 LAB — GLUCOSE, CAPILLARY
GLUCOSE-CAPILLARY: 190 mg/dL — AB (ref 70–99)
GLUCOSE-CAPILLARY: 213 mg/dL — AB (ref 70–99)
Glucose-Capillary: 141 mg/dL — ABNORMAL HIGH (ref 70–99)
Glucose-Capillary: 166 mg/dL — ABNORMAL HIGH (ref 70–99)
Glucose-Capillary: 183 mg/dL — ABNORMAL HIGH (ref 70–99)

## 2018-04-26 LAB — STREP PNEUMONIAE URINARY ANTIGEN: STREP PNEUMO URINARY ANTIGEN: NEGATIVE

## 2018-04-26 LAB — TSH: TSH: 2.296 u[IU]/mL (ref 0.350–4.500)

## 2018-04-26 MED ORDER — AZITHROMYCIN 500 MG PO TABS
500.0000 mg | ORAL_TABLET | Freq: Every day | ORAL | Status: DC
  Administered 2018-04-26 – 2018-04-28 (×3): 500 mg via ORAL
  Filled 2018-04-26 (×3): qty 1

## 2018-04-26 MED ORDER — IPRATROPIUM-ALBUTEROL 0.5-2.5 (3) MG/3ML IN SOLN: 3.0000 mL | RESPIRATORY_TRACT | Status: DC | PRN

## 2018-04-26 MED ORDER — LACTATED RINGERS IV SOLN
INTRAVENOUS | Status: DC
  Administered 2018-04-26: 16:00:00 via INTRAVENOUS

## 2018-04-26 MED ORDER — DEXTROSE IN LACTATED RINGERS 5 % IV SOLN: INTRAVENOUS | Status: DC

## 2018-04-26 MED ORDER — SODIUM CHLORIDE 0.9 % IV SOLN
1.0000 g | INTRAVENOUS | Status: DC
  Administered 2018-04-26: 1 g via INTRAVENOUS
  Filled 2018-04-26 (×2): qty 10

## 2018-04-26 NOTE — Progress Notes (Signed)
VASCULAR LAB PRELIMINARY  PRELIMINARY  PRELIMINARY  PRELIMINARY  Carotid duplex completed.    Preliminary report:  1-39% ICA plaquing. Vertebral artery flow is antegrade.   Teegan Guinther, RVT 04/26/2018, 3:18 PM

## 2018-04-26 NOTE — Progress Notes (Signed)
PROGRESS NOTE    [name]  HYQ:[id number] DOB: 1929/08/19 DOA: 04/25/2018 PCP: Deland Pretty, MD    Brief Narrative:  84 yo female who presented with dysarthria. She does have the past medical history of atrial fibrilaltion, hypothyroid, HTN, T2DM, RA and gerd. Patient not able to give detailed history, all information obtained from care giver at the bedside. Patient was noted to have acute dysarthria and facial droop. On the initial physical examination his blood pressure was 162/71, heart rate 88, respiratory 28, temperature 97.8, oxygen saturation 96%, dry mucous membranes, lungs clear to auscultation bilaterally, heart S1-S2 present and rhythmic, abdomen soft nontender, no lower extremity edema.  Nonfocal, positive dysarthria.  Sodium 139, potassium 3.9, chloride 102, bicarb 26, glucose 206, BUN 12, creatinine 0.81, white count 9.4, hemoglobin 10.7, hematocrit 35.5, platelets 317.  Urinalysis 25-50 white cells, specific gravity 1.015, treated with hyperinflation, right basilar reticulonodular infiltrate, prominent on the left lateral film.  MRI negative for acute changes.  EKG sinus rhythm, PVCs and PACs, normal axis, normal intervals, chronic lateral T wave inversions.   Patient was admitted to the hospital with working diagnosis of acute metabolic encephalopathy to rule out CVA.   Assessment & Plan:   Active Problems:   Hypothyroidism   Type 2 diabetes mellitus with other specified complication (HCC)   Essential hypertension   Rheumatoid arthritis involving both hands with positive rheumatoid factor (HCC)   TIA (transient ischemic attack)   1. Metabolic encephalopathy due to NEW community acquired pneumonia (present on admission). Clinically patient has improved with supportive medical therapy but not back to baseline, chest film with basal infiltrate on the right, more prominent on the left lateral view, will add antibiotic therapy with ceftriaxone and azithromycin, and will  follow swallow evaluation and repeat chest film. Patient with severe RA, that can lead to swallow dysfunction and aspiration pneumonia. Work up for CVA has been negative so far per MRI, pending on full workup to completely rule out CVA. Continue oxymetry monitoring. Check urinary antigen for streptococcus pneumonia and legionella. Check urine and blood culture. Patient will need more than 24 hours in the hospital.   2. T2DM. Will continue glucose cover and monitoring with insulin sliding scale, patient tolerating po well. Capillary glucose 195, 190, 141, 166.   3. Hypothyroidism. Will continue levothyroxine per home regimen.   4. RA. Advance disease, will continue pain control and physical therapy evaluation. Patient has home care.    DVT prophylaxis: enoxaparin   Code Status: full Family Communication: no family at the bedside  Disposition Plan/ discharge barriers: pending completion of workup.    Consultants:     Procedures:     Antimicrobials:   Ceftriaxone IV  Azithromycin.     Subjective: Patient's speech has improved, but mentation is not back to baseline, no chest pain or dyspnea, no cough, no nausea or vomiting. Patient is wheelchair bound due to severe arthritis.   Objective: Vitals:   04/26/18 0500 04/26/18 0600 04/26/18 0800 04/26/18 1200  BP:      Pulse: 73 73    Resp: 16 (!) 22    Temp:   98.1 F (36.7 C) 98.2 F (36.8 C)  TempSrc:      SpO2: 95% 96%    Weight:      Height:        Intake/Output Summary (Last 24 hours) at 04/26/2018 1311 Last data filed at 04/26/2018 0800 Gross per 24 hour  Intake 120 ml  Output 600 ml  Net -480 ml   Filed Weights   04/25/18 1400 04/25/18 2002  Weight: 44.3 kg (97 lb 10.6 oz) 44.1 kg (97 lb 3.6 oz)    Examination:   General: Not in pain or dyspnea, deconditioned  Neurology: Awake and alert, non focal. No lack of attention or disorganized thinking, able to respond to testing questions with difficulty.  E YSA:YTKZ  pallor, no icterus, oral mucosa moist Cardiovascular: No JVD. S1-S2 present, rhythmic, no gallops, rubs, or murmurs. No lower extremity edema. Pulmonary: positive breath sounds bilaterally, adequate air movement, no wheezing, rhonchi or rales. Gastrointestinal. Abdomen with, no organomegaly, non tender, no rebound or guarding Skin. No rashes Musculoskeletal: no joint deformities/ significant proximal metacarpal and metacarpophalangeal deformities.      Data Reviewed: I have personally reviewed following labs and imaging studies  CBC: Recent Labs  Lab 04/25/18 1415 04/25/18 1418  WBC 9.4  --   NEUTROABS 7.0  --   HGB 10.7* 11.9*  HCT 35.5* 35.0*  MCV 87.7  --   PLT 317  --    Basic Metabolic Panel: Recent Labs  Lab 04/25/18 1415 04/25/18 1418  NA 139 139  K 3.9 3.8  CL 102 101  CO2 26  --   GLUCOSE 206* 199*  BUN 12 13  CREATININE 0.81 0.70  CALCIUM 8.8*  --    GFR: Estimated Creatinine Clearance: 34.5 mL/min (by C-G formula based on SCr of 0.7 mg/dL). Liver Function Tests: Recent Labs  Lab 04/25/18 1415  AST 15  ALT 15  ALKPHOS 101  BILITOT 0.8  PROT 6.0*  ALBUMIN 3.1*   No results for input(s): LIPASE, AMYLASE in the last 168 hours. No results for input(s): AMMONIA in the last 168 hours. Coagulation Profile: Recent Labs  Lab 04/25/18 1415  INR 1.04   Cardiac Enzymes: No results for input(s): CKTOTAL, CKMB, CKMBINDEX, TROPONINI in the last 168 hours. BNP (last 3 results) No results for input(s): PROBNP in the last 8760 hours. HbA1C: Recent Labs    04/26/18 0513  HGBA1C 6.4*   CBG: Recent Labs  Lab 04/25/18 1411 04/25/18 2215 04/26/18 0858 04/26/18 1114  GLUCAP 195* 190* 141* 166*   Lipid Profile: Recent Labs    04/26/18 0513  CHOL 143  HDL 40*  LDLCALC 68  TRIG 173*  CHOLHDL 3.6   Thyroid Function Tests: Recent Labs    04/26/18 0513  TSH 2.296   Anemia Panel: No results for input(s): VITAMINB12, FOLATE, FERRITIN, TIBC,  IRON, RETICCTPCT in the last 72 hours.    Radiology Studies: I have reviewed all of the imaging during this hospital visit personally     Scheduled Meds: . enoxaparin (LOVENOX) injection  30 mg Subcutaneous Q24H  . insulin aspart  0-5 Units Subcutaneous QHS  . insulin aspart  0-9 Units Subcutaneous TID WC  . leflunomide  20 mg Oral Daily  . levothyroxine  100 mcg Oral QAC breakfast  . magnesium oxide  200 mg Oral BID  . pantoprazole  80 mg Oral Daily  . predniSONE  4 mg Oral Q breakfast   Continuous Infusions:   LOS: 0 days        Aneli Zara Gerome Apley, MD [facility] Pager [phone]

## 2018-04-26 NOTE — Evaluation (Signed)
Physical Therapy Evaluation Patient Details Name: [name] MRN: [id number] DOB: [birth year] Today's Date: 04/26/2018   History of Present Illness  84 y.o. female with medical history significant of Hypothyroidism, HTN, DM2, RA, GERD, Afib who presented for dysarthria. MRI negative for acute stroke.   Clinical Impression  Pt was seen for evaluation of mobility with OT and PT seeing together due to dependent mobility and cognition of pt requiring more assistance.  Pt is not currently living in a situation with 24/7 help and requires 2 person assist to move.  Will not be able to go home without this help.  Follow along acutely for strengthening and mobility, and will anticipate transition to SNF when medically ready to go.    Follow Up Recommendations SNF    Equipment Recommendations  None recommended by PT    Recommendations for Other Services       Precautions / Restrictions Precautions Precautions: Fall Required Braces or Orthoses: Other Brace/Splint(L foot surgery with protective shoe) Restrictions Weight Bearing Restrictions: No      Mobility  Bed Mobility Overal bed mobility: Needs Assistance Bed Mobility: Supine to Sit;Sit to Supine     Supine to sit: Min assist Sit to supine: Mod assist   General bed mobility comments: lifted trunk out of bed and lsgs back in   Transfers Overall transfer level: Needs assistance Equipment used: Rolling walker (2 wheeled);1 person hand held assist Transfers: Sit to/from [facility] Sit to Stand: Mod assist;+2 physical assistance;+2 safety/equipment;Max assist Stand pivot transfers: Max assist;+2 physical assistance;+2 safety/equipment;Mod assist Squat pivot transfers: +2 physical assistance;+2 safety/equipment     General transfer comment: Heavier transfer to bed squat pivot  Ambulation/Gait                Stairs            Wheelchair Mobility    Modified Rankin (Stroke Patients Only)        Balance Overall balance assessment: Needs assistance Sitting-balance support: Feet supported;Bilateral upper extremity supported Sitting balance-Leahy Scale: Fair     Standing balance support: Bilateral upper extremity supported;During functional activity Standing balance-Leahy Scale: Poor                               Pertinent Vitals/Pain Pain Assessment: Faces Faces Pain Scale: Hurts a little bit Pain Location: back Pain Descriptors / Indicators: Sore    Home Living Family/patient expects to be discharged to:: Private residence Living Arrangements: Alone Available Help at Discharge: Personal care attendant;Available PRN/intermittently Type of Home: House Home Access: Stairs to enter Entrance Stairs-Rails: None Entrance Stairs-Number of Steps: 2 Home Layout: One level Home Equipment: Walker - 4 wheels;Wheelchair - manual;Shower seat;Bedside commode Additional Comments: has part time help only    Prior Function Level of Independence: Needs assistance   Gait / Transfers Assistance Needed: pivot to the chair  ADL's / Homemaking Assistance Needed: caregiver assisted housework, pt self care and cooking        Hand Dominance   Dominant Hand: Right    Extremity/Trunk Assessment   Upper Extremity Assessment Upper Extremity Assessment: Generalized weakness    Lower Extremity Assessment Lower Extremity Assessment: Generalized weakness    Cervical / Trunk Assessment Cervical / Trunk Assessment: Kyphotic  Communication   Communication: HOH  Cognition Arousal/Alertness: Awake/alert Behavior During Therapy: WFL for tasks assessed/performed Overall Cognitive Status: Impaired/Different from baseline Area of Impairment: Orientation;Attention;Memory;Following commands;Safety/judgement;Awareness;Problem solving  Orientation Level: Situation;Time Current Attention Level: Selective Memory: Decreased short-term memory Following  Commands: Follows one step commands with increased time;Follows one step commands inconsistently Safety/Judgement: Decreased awareness of safety;Decreased awareness of deficits Awareness: Intellectual Problem Solving: Slow processing;Difficulty sequencing;Requires verbal cues;Requires tactile cues General Comments: pt agitated about having therapists assist her and did not remember she needed help to get onto and off [facility]      General Comments      Exercises     Assessment/Plan    PT Assessment Patient needs continued PT services  PT Problem List Decreased strength;Decreased range of motion;Decreased activity tolerance;Decreased balance;Decreased mobility;Decreased coordination;Decreased cognition;Decreased knowledge of use of DME;Decreased safety awareness;Pain       PT Treatment Interventions DME instruction;Gait training;Functional mobility training;Therapeutic exercise;Therapeutic activities;Balance training;Neuromuscular re-education;Patient/family education    PT Goals (Current goals can be found in the Care Plan section)  Acute Rehab PT Goals Patient Stated Goal: go home PT Goal Formulation: With patient Time For Goal Achievement: 05/10/18 Potential to Achieve Goals: Fair    Frequency Min 2X/week   Barriers to discharge Decreased caregiver support;Inaccessible home environment 2 person assist needed 24/7    Co-evaluation PT/OT/SLP Co-Evaluation/Treatment: Yes Reason for Co-Treatment: Complexity of the patient's impairments (multi-system involvement);To address functional/ADL transfers;For patient/therapist safety PT goals addressed during session: Mobility/safety with mobility;Balance;Proper use of DME         AM-PAC PT "6 Clicks" Daily Activity  Outcome Measure Difficulty turning over in bed (including adjusting bedclothes, sheets and blankets)?: Unable Difficulty moving from lying on back to sitting on the side of the bed? : Unable Difficulty sitting down on and  standing up from a chair with arms (e.g., wheelchair, bedside commode, etc,.)?: Unable Help needed moving to and from a bed to chair (including a wheelchair)?: A Lot Help needed walking in hospital room?: Total Help needed climbing 3-5 steps with a railing? : Total 6 Click Score: 7    End of Session Equipment Utilized During Treatment: Gait belt Activity Tolerance: Patient limited by fatigue;Other (comment);Treatment limited secondary to medical complications (Comment)(generalized weakness) Patient left: in bed;with call bell/phone within reach;with bed alarm set;with nursing/sitter in room Nurse Communication: Mobility status PT Visit Diagnosis: Unsteadiness on feet (R26.81);Muscle weakness (generalized) (M62.81);Difficulty in walking, not elsewhere classified (R26.2)    Time: 2979-8921 PT Time Calculation (min) (ACUTE ONLY): 30 min   Charges:   PT Evaluation $PT Eval Moderate Complexity: 1 Mod         Ramond Dial 04/26/2018, 10:02 PM   Mee Hives, PT MS Acute Rehab Dept. Number: [place]

## 2018-04-26 NOTE — Progress Notes (Signed)
MRI obtained in the ED yesterday was negative for acute stroke. MRA showed normal appearing intracranial arterial circulation with minimal to no atherosclerotic disease.   MRI/MRA report conclusions:  1.  No acute intracranial abnormality. 2. Stable limited MRI appearance of the brain compared to 02/25/2018. 3. Negative intracranial MRA; mild for age intracranial Atherosclerosis.  A/R: 84 year old female with atrial fibrillation, presenting with slurred speech.  1. On exam in the ED she had difficulty with complex motor commands, in a pattern more consistent with cognitive impairment than receptive aphasia.  2. Although cognitive fluctuation is higher on the DDx than stroke, would complete stroke workup as documented in initial neurology consult note.  3. Also will need toxic/metabolic/infectious work up.   Electronically signed: Dr. Kerney Elbe

## 2018-04-26 NOTE — Progress Notes (Signed)
  Echocardiogram 2D Echocardiogram has been performed.  [name] 04/26/2018, 1:43 PM

## 2018-04-26 NOTE — Evaluation (Addendum)
Occupational Therapy Evaluation Patient Details Name: [name] MRN: [id number] DOB: 12/07/1928 Today's Date: 04/26/2018    History of Present Illness 84 y.o. female with medical history significant of Hypothyroidism, HTN, DM2, RA, GERD, Afib who presented for dysarthria. MRI negative for acute stroke.    Clinical Impression   Pt admitted for above. Pt getting assist at home with ADLs, PTA. Feel pt will benefit from acute OT to increase independence prior to d/c. Recommending SNF unless they can work out adequate assist at home 24/7 and [place].     Follow Up Recommendations  SNF;Supervision/Assistance - 24 hour(or adequate 24/7 assist at home with [facility])    Equipment Recommendations  Tub/shower bench    Recommendations for Other Services       Precautions / Restrictions Precautions Precautions: Fall Required Braces or Orthoses: Other Brace/Splint(has surgical shoe for Lt foot) Restrictions Weight Bearing Restrictions: No      Mobility Bed Mobility Overal bed mobility: Needs Assistance Bed Mobility: Supine to Sit;Sit to Supine     Supine to sit: Min assist Sit to supine: Mod assist   General bed mobility comments: assist with bilateral LEs to return to bed  Transfers Overall transfer level: Needs assistance   Transfers: Sit to/from Stand;Squat Pivot Transfers Sit to Stand: +2 physical assistance;Max assist(+2-Mod-Max assist)   Squat pivot transfers: +2 safety/equipment;Max assist(+2 Mod-Max Assist)     General transfer comment: transferred from bed to 3 in 1 and back to bed after using 3 in 1. More assist for returning to bed.     Balance    Unsteady on feet requiring assist- used RW to stand in session and also pivoted without RW but with assist from therapist.                                         ADL either performed or assessed with clinical judgement   ADL Overall ADL's : Needs assistance/impaired Eating/Feeding: Sitting;Minimal  assistance Eating/Feeding Details (indicate cue type and reason): put big piece of canteloupe in mouth bc she said she couldn't cut it                 Lower Body Dressing: Total assistance;+2 for physical assistance;Sit to/from stand   Toilet Transfer: Maximal assistance;+2 for physical assistance;+2 for safety/equipment;Squat-pivot;RW;BSC(and sit to stand transfer)   Toileting- Clothing Manipulation and Hygiene: Total assistance;+2 for physical assistance;Sit to/from stand       Functional mobility during ADLs: Maximal assistance;+2 for physical assistance;Rolling walker;+2 for safety/equipment General ADL Comments: Pt had bowel movement in session-became agitated. OT/PT assisted in getting pt cleaned up and back to bed.      Vision         Perception     Praxis      Pertinent Vitals/Pain Pain Assessment: 0-10 Pain Score: 1  Pain Location: back Pain Descriptors / Indicators: Sore Pain Intervention(s): Monitored during session;Repositioned     Hand Dominance     Extremity/Trunk Assessment Upper Extremity Assessment Upper Extremity Assessment: Generalized weakness;RUE deficits/detail;LUE deficits/detail RUE Deficits / Details: limited ROM in digits-appears to be arthritis LUE Deficits / Details: limited ROM in digits-appears to be arthritis   Lower Extremity Assessment Lower Extremity Assessment: Defer to PT evaluation       Communication Communication Communication: HOH   Cognition Arousal/Alertness: Awake/alert Behavior During Therapy: WFL for tasks assessed/performed;Agitated Overall Cognitive Status: Impaired/Different from baseline(caregiver reports  pt is off from baseline) Area of Impairment: Orientation;Awareness;Safety/judgement                 Orientation Level: Disoriented to;Time             General Comments: pt not aware she was on 3 in 1. only wanted one therapist to help her get cleaned up after BM- PT explained not safe.     General Comments       Exercises     Shoulder Instructions      Home Living Family/patient expects to be discharged to:: Private residence Living Arrangements: Alone Available Help at Discharge: Personal care attendant(no one there 24/7 PTA) Type of Home: House Home Access: Stairs to enter [employer] of Steps: 2 Entrance Stairs-Rails: None Home Layout: One level     Bathroom Shower/Tub: Teacher, early years/pre: Standard     Home Equipment: Environmental consultant - 4 wheels;Wheelchair - manual;Shower seat;Bedside commode   Additional Comments: did not have someone with her 24/7, PTA.       Prior Functioning/Environment Level of Independence: Needs assistance  Gait / Transfers Assistance Needed: assist with transfers-pivots to wheelchair ADL's / Homemaking Assistance Needed: heavy assist with feeding, dressing and bathing-level of assist depends on day   Comments: has cups with handles on it at home; caregivers perform bed bath at home due to it being too difficult getting in tub. OT mentioned tub bench.        OT Problem List: Decreased strength;Decreased range of motion;Decreased activity tolerance;Impaired balance (sitting and/or standing);Decreased cognition;Decreased knowledge of use of DME or AE;Decreased safety awareness;Decreased knowledge of precautions;Pain;Impaired UE functional use      OT Treatment/Interventions: Self-care/ADL training;DME and/or AE instruction;Therapeutic activities;Cognitive remediation/compensation;Patient/family education;Balance training    OT Goals(Current goals can be found in the care plan section) Acute Rehab OT Goals Patient Stated Goal: go home OT Goal Formulation: (not discussed ) Time For Goal Achievement: 05/03/18 Potential to Achieve Goals: Fair ADL Goals Pt Will Perform Upper Body Bathing: sitting;with min assist Pt Will Perform Upper Body Dressing: with min assist;sitting Pt Will Transfer to Toilet: with min  assist;squat pivot transfer;bedside commode  OT Frequency: Min 2X/week   Barriers to D/C:            Co-evaluation PT/OT/SLP Co-Evaluation/Treatment: Yes(part of session) Reason for Co-Treatment: For patient/therapist safety   OT goals addressed during session: ADL's and self-care;Other (comment)(mobility)      AM-PAC PT "6 Clicks" Daily Activity     Outcome Measure Help from another person eating meals?: A Little Help from another person taking care of personal grooming?: A Little Help from another person toileting, which includes using toliet, bedpan, or urinal?: Total Help from another person bathing (including washing, rinsing, drying)?: A Lot Help from another person to put on and taking off regular upper body clothing?: A Lot Help from another person to put on and taking off regular lower body clothing?: Total 6 Click Score: 12   End of Session Equipment Utilized During Treatment: Rolling walker Nurse Communication: Mobility status;Other (comment)(needs mesh panties and new catheter; pt had bowel movement)  Activity Tolerance: Patient tolerated treatment well Patient left: in bed;Other (comment)(with caregiver and PT)  OT Visit Diagnosis: Unsteadiness on feet (R26.81);Muscle weakness (generalized) (M62.81)                Time: 7591-6384 OT Time Calculation (min): 35 min Charges:  OT General Charges $OT Visit: 1 Visit OT Evaluation $OT Eval Moderate Complexity: 1  Mod   Talina Pleitez L Falesha Schommer OTR/L 04/26/2018, 11:06 AM

## 2018-04-27 DIAGNOSIS — R0600 Dyspnea, unspecified: Secondary | ICD-10-CM

## 2018-04-27 LAB — CBC WITH DIFFERENTIAL/PLATELET
Abs Immature Granulocytes: 0.1 10*3/uL (ref 0.0–0.1)
BASOS PCT: 0 %
Basophils Absolute: 0 10*3/uL (ref 0.0–0.1)
Eosinophils Absolute: 0.1 10*3/uL (ref 0.0–0.7)
Eosinophils Relative: 2 %
HCT: 34.4 % — ABNORMAL LOW (ref 36.0–46.0)
HEMOGLOBIN: 10.5 g/dL — AB (ref 12.0–15.0)
IMMATURE GRANULOCYTES: 1 %
LYMPHS ABS: 1.1 10*3/uL (ref 0.7–4.0)
Lymphocytes Relative: 13 %
MCH: 26.5 pg (ref 26.0–34.0)
MCHC: 30.5 g/dL (ref 30.0–36.0)
MCV: 86.9 fL (ref 78.0–100.0)
MONO ABS: 0.6 10*3/uL (ref 0.1–1.0)
MONOS PCT: 7 %
NEUTROS PCT: 77 %
Neutro Abs: 6.3 10*3/uL (ref 1.7–7.7)
PLATELETS: 311 10*3/uL (ref 150–400)
RBC: 3.96 MIL/uL (ref 3.87–5.11)
RDW: 19.3 % — ABNORMAL HIGH (ref 11.5–15.5)
WBC: 8.2 10*3/uL (ref 4.0–10.5)

## 2018-04-27 LAB — URINE CULTURE: CULTURE: NO GROWTH

## 2018-04-27 LAB — BASIC METABOLIC PANEL
ANION GAP: 9 (ref 5–15)
BUN: 9 mg/dL (ref 8–23)
CHLORIDE: 102 mmol/L (ref 98–111)
CO2: 27 mmol/L (ref 22–32)
Calcium: 8.2 mg/dL — ABNORMAL LOW (ref 8.9–10.3)
Creatinine, Ser: 0.55 mg/dL (ref 0.44–1.00)
GFR calc Af Amer: >60 mL/min (ref >60)
GLUCOSE: 239 mg/dL — AB (ref 70–99)
POTASSIUM: 3.3 mmol/L — AB (ref 3.5–5.1)
SODIUM: 138 mmol/L (ref 135–145)

## 2018-04-27 LAB — GLUCOSE, CAPILLARY
GLUCOSE-CAPILLARY: 219 mg/dL — AB (ref 70–99)
GLUCOSE-CAPILLARY: 203 mg/dL — AB (ref 70–99)
GLUCOSE-CAPILLARY: 210 mg/dL — AB (ref 70–99)
Glucose-Capillary: 214 mg/dL — ABNORMAL HIGH (ref 70–99)

## 2018-04-27 MED ORDER — LORAZEPAM 2 MG/ML IJ SOLN
0.5000 mg | INTRAMUSCULAR | Status: DC | PRN
  Administered 2018-04-27: 0.5 mg via INTRAVENOUS
  Filled 2018-04-27: qty 1

## 2018-04-27 MED ORDER — LIDOCAINE 5 % EX PTCH
1.0000 | MEDICATED_PATCH | CUTANEOUS | Status: DC
  Administered 2018-04-27 – 2018-04-29 (×3): 1 via TRANSDERMAL
  Filled 2018-04-27 (×3): qty 1

## 2018-04-27 MED ORDER — HALOPERIDOL LACTATE 5 MG/ML IJ SOLN
1.0000 mg | Freq: Four times a day (QID) | INTRAMUSCULAR | Status: DC | PRN
  Administered 2018-04-27: 1 mg via INTRAMUSCULAR
  Filled 2018-04-27 (×2): qty 1

## 2018-04-27 MED ORDER — WHITE PETROLATUM EX OINT
TOPICAL_OINTMENT | CUTANEOUS | Status: AC
  Administered 2018-04-27: 16:00:00
  Filled 2018-04-27: qty 28.35

## 2018-04-27 MED ORDER — WHITE PETROLATUM EX OINT
TOPICAL_OINTMENT | CUTANEOUS | Status: AC
  Administered 2018-04-27: 0.2
  Filled 2018-04-27: qty 28.35

## 2018-04-27 MED ORDER — HALOPERIDOL 1 MG PO TABS
1.0000 mg | ORAL_TABLET | Freq: Four times a day (QID) | ORAL | Status: DC | PRN
  Filled 2018-04-27: qty 1

## 2018-04-27 MED ORDER — ONDANSETRON HCL 4 MG/2ML IJ SOLN
4.0000 mg | Freq: Four times a day (QID) | INTRAMUSCULAR | Status: DC | PRN
  Administered 2018-04-27: 4 mg via INTRAVENOUS

## 2018-04-27 NOTE — Progress Notes (Signed)
PROGRESS NOTE    [name]  PPJ:[id number] DOB: 04-27-1929 DOA: 04/25/2018 PCP: Deland Pretty, MD    Brief Narrative:  84 yo female who presented with dysarthria. She does have the past medical history of atrial fibrilaltion, hypothyroid, HTN, T2DM, RA and gerd. Patient not able to give detailed history, all information obtained from care giver at the bedside. Patient was noted to have acute dysarthria and facial droop. On the initial physical examination his blood pressure was 162/71, heart rate 88, respiratory 28, temperature 97.8, oxygen saturation 96%, dry mucous membranes, lungs clear to auscultation bilaterally, heart S1-S2 present and rhythmic, abdomen soft nontender, no lower extremity edema.  Nonfocal, positive dysarthria.  Sodium 139, potassium 3.9, chloride 102, bicarb 26, glucose 206, BUN 12, creatinine 0.81, white count 9.4, hemoglobin 10.7, hematocrit 35.5, platelets 317.  Urinalysis 25-50 white cells, specific gravity 1.015, treated with hyperinflation, right basilar reticulonodular infiltrate, prominent on the left lateral film.  MRI negative for acute changes.  EKG sinus rhythm, PVCs and PACs, normal axis, normal intervals, chronic lateral T wave inversions.   Patient was admitted to the hospital with working diagnosis of acute metabolic encephalopathy to rule out CVA.   Assessment & Plan:   Active Problems:   Hypothyroidism   Type 2 diabetes mellitus with other specified complication (HCC)   Essential hypertension   Rheumatoid arthritis involving both hands with positive rheumatoid factor (HCC)   TIA (transient ischemic attack)   Pneumonia   1. Metabolic encephalopathy due to NEW community acquired pneumonia/ aspiration pneumonitis (present on admission). CVA has been ruled out, mentation has improved, but continue to have episodes of agitation, will add as needed haldol. Follow up chest film with improvement of infiltrates, suggesting aspiration pneumonitis, will  plan for short course of antibiotics (azithromycin) considering patients medical problems. Follow swallow evaluation. Carotids with no stenosis, echocardiogram with preserved LV systolic function with mild to moderated aortic stenosis and mitral regurgitation.   2. T2DM. Glucose cover and monitoring with insulin sliding scale, patient tolerating po well. Capillary glucose 166, 213, 183, 219, 214.   3. Hypothyroidism. On levothyroxine.   4. RA. Pain control and physical therapy, she dos have advance deformities, plan for snf.   DVT prophylaxis: enoxaparin   Code Status: full Family Communication: no family at the bedside  Disposition Plan/ discharge barriers: SNF placement.    Consultants:     Procedures:     Antimicrobials:   Ceftriaxone IV  Azithromycin.    Subjective: Patient continue to have back pain, feeling very uncomfortable, had episodes of confusion and agitation, no nausea or vomiting, no frank dyspnea or cough.   Objective: Vitals:   04/26/18 1944 04/27/18 0031 04/27/18 0525 04/27/18 0838  BP: (!) 147/77 140/79 (!) 179/97 (!) 168/88  Pulse: 96 83 80 82  Resp: 18 18 20 18   Temp: (!) 97.4 F (36.3 C) 98.4 F (36.9 C) 97.7 F (36.5 C) 98.4 F (36.9 C)  TempSrc: Oral Oral Oral Oral  SpO2: 96% 96% 93% 92%  Weight:      Height:        Intake/Output Summary (Last 24 hours) at 04/27/2018 1033 Last data filed at 04/27/2018 0900 Gross per 24 hour  Intake 484.17 ml  Output 400 ml  Net 84.17 ml   Filed Weights   04/25/18 1400 04/25/18 2002  Weight: 44.3 kg (97 lb 10.6 oz) 44.1 kg (97 lb 3.6 oz)    Examination:   General: Not in pain or dyspnea,deconditioned  Neurology:  Awake and alert, non focal  E ENT: no pallor, no icterus, oral mucosa moist Cardiovascular: No JVD. S1-S2 present, rhythmic, no gallops, rubs, or murmurs. No lower extremity edema. Pulmonary: positive breath sounds bilaterally, decreased air movement, no wheezing, rhonchi or  rales. Gastrointestinal. Abdomen with no organomegaly, non tender, no rebound or guarding Skin. No rashes Musculoskeletal: no joint deformities     Data Reviewed: I have personally reviewed following labs and imaging studies  CBC: Recent Labs  Lab 04/25/18 1415 04/25/18 1418 04/27/18 0553  WBC 9.4  --  8.2  NEUTROABS 7.0  --  6.3  HGB 10.7* 11.9* 10.5*  HCT 35.5* 35.0* 34.4*  MCV 87.7  --  86.9  PLT 317  --  800   Basic Metabolic Panel: Recent Labs  Lab 04/25/18 1415 04/25/18 1418 04/27/18 0553  NA 139 139 138  K 3.9 3.8 3.3*  CL 102 101 102  CO2 26  --  27  GLUCOSE 206* 199* 239*  BUN 12 13 9   CREATININE 0.81 0.70 0.55  CALCIUM 8.8*  --  8.2*   GFR: Estimated Creatinine Clearance: 34.5 mL/min (by C-G formula based on SCr of 0.55 mg/dL). Liver Function Tests: Recent Labs  Lab 04/25/18 1415  AST 15  ALT 15  ALKPHOS 101  BILITOT 0.8  PROT 6.0*  ALBUMIN 3.1*   No results for input(s): LIPASE, AMYLASE in the last 168 hours. No results for input(s): AMMONIA in the last 168 hours. Coagulation Profile: Recent Labs  Lab 04/25/18 1415  INR 1.04   Cardiac Enzymes: No results for input(s): CKTOTAL, CKMB, CKMBINDEX, TROPONINI in the last 168 hours. BNP (last 3 results) No results for input(s): PROBNP in the last 8760 hours. HbA1C: Recent Labs    04/26/18 0513  HGBA1C 6.4*   CBG: Recent Labs  Lab 04/26/18 0858 04/26/18 1114 04/26/18 1622 04/26/18 2114 04/27/18 0611  GLUCAP 141* 166* 213* 183* 219*   Lipid Profile: Recent Labs    04/26/18 0513  CHOL 143  HDL 40*  LDLCALC 68  TRIG 173*  CHOLHDL 3.6   Thyroid Function Tests: Recent Labs    04/26/18 0513  TSH 2.296   Anemia Panel: No results for input(s): VITAMINB12, FOLATE, FERRITIN, TIBC, IRON, RETICCTPCT in the last 72 hours.    Radiology Studies: I have reviewed all of the imaging during this hospital visit personally     Scheduled Meds: . azithromycin  500 mg Oral Daily    . enoxaparin (LOVENOX) injection  30 mg Subcutaneous Q24H  . insulin aspart  0-5 Units Subcutaneous QHS  . insulin aspart  0-9 Units Subcutaneous TID WC  . leflunomide  20 mg Oral Daily  . levothyroxine  100 mcg Oral QAC breakfast  . magnesium oxide  200 mg Oral BID  . pantoprazole  80 mg Oral Daily  . predniSONE  4 mg Oral Q breakfast   Continuous Infusions: . cefTRIAXone (ROCEPHIN)  IV Stopped (04/26/18 1654)  . lactated ringers 50 mL/hr at 04/26/18 1619     LOS: 1 day        Tawni Millers, MD [facility] Pager [phone]

## 2018-04-27 NOTE — Plan of Care (Signed)
  Problem: Coping: Goal: Level of anxiety will decrease Outcome: Progressing   Problem: Elimination: Goal: Will not experience complications related to bowel motility Outcome: Progressing   

## 2018-04-27 NOTE — Clinical Social Work Note (Signed)
[name] son agreeable to f/o to [facility].  Loletha Grayer, MSW [phone]

## 2018-04-27 NOTE — Clinical Social Work Note (Signed)
Clinical Social Work Assessment  Patient Details  Name: [name] MRN: [id number] Date of Birth: March 23, 1929  Date of referral:  04/27/18               Reason for consult:  Facility Placement, Discharge Planning                Permission sought to share information with:  Family Supports Permission granted to share information::     Name::     [name]   Agency::  family  Relationship::  son  Contact Information:  [name] [phone]  Housing/Transportation Living arrangements for the past 2 months:  Single Family Home(alone. ) Source of Information:  Adult Children(Greg) Patient Interpreter Needed:  None Criminal Activity/Legal Involvement Pertinent to Current Situation/Hospitalization:  No - Comment as needed Significant Relationships:  Adult Children Lives with:  Self Do you feel safe going back to the place where you live?  Yes Need for family participation in patient care:  Yes (Comment)  Care giving concerns:  CSW for PT recommending SNF at the time of discharge.    Social Worker assessment / plan:  CSW spoke with pt's son via phone [name]). [name] expressed that pt is from home alone. Pt has been to rehab in the past, however son felt that this has not been very helpful to pt in the past. Son expressed that he wouldn't mind pt going to a facility for rehab, however he expresses that pt may not be willing to go. CSW explained how CSW's get involved in the process and the next steps in possible placement for pt.   Employment status:  Retired Forensic scientist:  [employer] PT Recommendations:  [place] / Referral to community resources:  [place]  Patient/Family's Response to care:  No further questions or concerns have been presented to CSW at this time.   Patient/Family's Understanding of and Emotional Response to Diagnosis, Current Treatment, and Prognosis:  No further question or concerns have been  presented to CSW at this time.   Emotional Assessment Appearance:  Appears stated age Attitude/Demeanor/Rapport:  Unable to Assess Affect (typically observed):  Unable to Assess Orientation:  (CSW spoke with son over phone as chart showed pt only oriented to self. ) Alcohol / Substance use:  Not Applicable Psych involvement (Current and /or in the community):  No (Comment)  Discharge Needs  Concerns to be addressed:  Care Coordination Readmission within the last 30 days:  No Current discharge risk:  Dependent with Mobility Barriers to Discharge:  Continued Medical Work up   [employer], [place] 04/27/2018, 9:55 AM

## 2018-04-27 NOTE — Progress Notes (Signed)
Pt very agitated and restless, attempting to climb OOB and "get out of here"; yelling at the top of her lungs. On call provider paged, requested for 1:1 sitter order.

## 2018-04-27 NOTE — Progress Notes (Signed)
Carotid ultrasound completed. Per preliminary report there is 1-39% ICA plaquing. Vertebral artery flow is antegrade.  Echocardiogram completed. Report conclusions as follows:  Left ventricle: The cavity size was normal. There was mild   concentric hypertrophy. Systolic function was normal. The   estimated ejection fraction was in the range of 50% to 55%. Wall   motion was normal; there were no regional wall motion   abnormalities. - Aortic valve: Severely calcified annulus. Trileaflet; moderately   calcified leaflets. Morphologically, it appears there is mild to   moderate calcific aortic stenosis. Valve area (VTI): 2.18 cm^2.   Valve area (Vmax): 2.21 cm^2. Valve area (Vmean): 2.04 cm^2. - Mitral valve: Mildly calcified annulus. There was mild to   moderate regurgitation. - Left atrium: The atrium was severely dilated.  EKG was without atrial fibrillation.   MRI brain was negative for acute infarction.   MRA head was normal.   A/R: 84 year old female initially presenting as code stroke for confusion.  1. Overall clinical picture most consistent with AMS secondary to infection. Stroke work up complete without any findings to suggest that she had a TIA on presentation. Has listed history of a-fib but EKG here was NSR and may be a falls risk; she was not on anticoagulation at home. However, would consider starting her on ASA for prophylaxis which should be overall a safe alternative from a risk/benefit standpoint even if she is a falls risk.  2.  Neurology will sign off. Please call if there are additional questions.   Electronically signed: Dr. Kerney Elbe

## 2018-04-27 NOTE — Evaluation (Signed)
Speech Language Pathology Evaluation Patient Details Name: [name] MRN: [id number] DOB: 1929/04/03 Today's Date: 04/27/2018 Time: 5427-0623 SLP Time Calculation (min) (ACUTE ONLY): 28 min  Problem List:  Patient Active Problem List   Diagnosis Date Noted  . Pneumonia 04/26/2018  . TIA (transient ischemic attack) 04/25/2018  . Aphasia   . Pressure injury of skin 02/26/2018  . Hyperkalemia 02/25/2018  . Altered mental status 02/25/2018  . Acute lower UTI 02/25/2018  . Iron deficiency anemia due to chronic blood loss 12/17/2017  . Protein-calorie malnutrition, severe 11/30/2017  . Syncope 11/29/2017  . Diabetic ulcer of left foot associated with type 2 diabetes mellitus ([place]) 08/11/2015  . Closed right hip fracture ([place]) 08/28/2012  . History of gastroesophageal reflux (GERD) 06/26/2011  . Lower GI bleeding 06/26/2011  . DEPRESSION 03/31/2009  . DIARRHEA, FUNCTIONAL 03/31/2009  . DIABETIC HYPOGLYCEMIA, TYPE II 11/03/2008  . ACUTE BRONCHITIS 08/23/2008  . AV NODAL REENTRY TACHYCARDIA 10/29/2007  . TACHYCARDIA 10/29/2007  . CHEST PAIN, ATYPICAL 10/25/2007  . ADVEF, DRUG/MEDICINAL/BIOLOGICAL SUBST NOS 07/16/2007  . Nonspecific (abnormal) findings on radiological and other examination of body structure 04/03/2007  . CHEST XRAY, ABNORMAL 04/03/2007  . Hypothyroidism 03/06/2007  . Type 2 diabetes mellitus with other specified complication ([place]) 76/28/3151  . HYPERLIPIDEMIA 03/06/2007  . Essential hypertension 03/06/2007  . DIVERTICULOSIS, COLON 03/06/2007  . Rheumatoid arthritis involving both hands with positive rheumatoid factor ([place]) 03/06/2007   Past Medical History:  Past Medical History:  Diagnosis Date  . Acute bronchitis   . Anemia, unspecified   . Atrial fibrillation ([place])   . Depressive disorder, not elsewhere classified   . Diverticulosis of colon (without mention of hemorrhage)   . Esophageal reflux   . Family history of malignant neoplasm of  gastrointestinal tract   . Functional diarrhea   . Hiatal hernia   . History of blood transfusion    "once; related to diverticulitis" (11/29/2017)  . Other and unspecified hyperlipidemia   . Other chest pain   . Other specified cardiac dysrhythmias(427.89)   . Personal history of colonic polyps 07/17/1995   adenomatous polyps  . PONV (postoperative nausea and vomiting)   . Recurrent UTI (urinary tract infection)    "2-3 times in the last 1 1/46yr" (11/29/2017)  . Rheumatoid arthritis ([place])   . Tachycardia, unspecified   . Type II diabetes mellitus ([place])   . Unspecified adverse effect of unspecified drug, medicinal and biological substance   . Unspecified essential hypertension   . Unspecified hypothyroidism    Past Surgical History:  Past Surgical History:  Procedure Laterality Date  . ABDOMINAL HYSTERECTOMY     "partial"  . CATARACT EXTRACTION Bilateral   . FOREARM FRACTURE SURGERY Right 1940s?  . FRACTURE SURGERY    . ORIF FEMUR FRACTURE  08/30/2012   Procedure: OPEN REDUCTION INTERNAL FIXATION (ORIF) DISTAL FEMUR FRACTURE;  Surgeon: Mauri Pole, MD;  Location: [facility]S;  Service: Orthopedics;  Laterality: Right;  proximal femur  . THYROIDECTOMY, PARTIAL     HPI:  [name] is a 84 y.o. female with medical history significant of Hypothyroidism, HTN, DM2, RA, GERd, Afib who presents for dysarthria.  Unfortunately, patient is alone and unable to give much history. History obtained from discussion with EDP and review of chart.  Apparently, patient presented with dysarthria and facial droop noted by caregiver and was brought to the ED.  Neurology evaluated and initial symptoms were apparently resolved.  Per neurology, she was inattentive and they were  concerned for a metabolic cause.  When I evaluated her, she had dysarthria and was impossible to understand mostly.  She did not follow directions, but did answer some questions.  She would speak at length, but it was unclear what  she was saying.  It appeared that she had expressive aphasia and also has some processing issues.  Unclear how acute this is and what her baseline is.    ED Course: She had initial labs in the ED which showed Glucose of 206 and some mild anemia which is chronic for her.  She had a CT of her head which showed only chronic changes.  Neurology evaluated her and recommended MRI/MRA of the brain and other standard work up for TIA.   MRI of the head was completed and negative for acute findings.     Assessment / Plan / Recommendation Clinical Impression  Cognitive/linguistic and motor speech screen was completed in setting for admission due to possible CVA.  However, MRI of the head was negative for acute findings and patient was found to have new community acquired PNA.  She remains more confused then normal per caretaker who has worked with her for the last 3-4 months.  Care taker reported that she lives alone with caretaker present in the AM and early evenings.  Otherwise she is home alone.  Per patient no family are local that can provide assistance.  Limited oral mechanism exam was completed.  Lingual/labial range of motion was adequate.  Facial droop was not seen.  Strength was unable to be assessed due to limited ability to follow directions and poor attention to task but deficits not suspected.  Speech was clear and easy to understand.  No discernible dysarthria was noted.  The patient achieved a score of 13/28 points (ie writing portions were not administered) on the Mini Mental State Examination suggesting moderate cogntive deficits.   She was oriented to person and location.  She was disoriented to time and situation.  Immediate recall of 3 novel words was good.  However, given short delay she was unable to recall any of the words.  Semantic cues were only partially helpful to facilitate recall.  Of note, the patient was very easily distracted and had a hard time maintaining attention to task.  This further  impacted her ability to recall and perform requested tasks.  She frequently asked for things to be repeated.  She was able to name objects and read/comprehend a short sentence.  She was unable to repeat a short phrase or follow a 3 step directions.  Suspect memory/attention issues impacted her ability to perform these tasks.  The writing/copying tasks were not completed as the patient reported at baseline she only writes her name due to issues writing.  She struggled to provide logical solutions to simple problems even given cues.  Given her current presentation the patient is a safety risk if left home alone.  Suggest 24x7 supervision.  Recommend ST at the next level of care to [facility] functional status and develop compensatory strategies as needed.  ST will follow at least 1x/week during acute stay.      SLP Assessment  SLP Recommendation/Assessment: Patient needs continued Speech Lanaguage Pathology Services SLP Visit Diagnosis: Attention and concentration deficit    Follow Up Recommendations  Skilled Nursing facility    Frequency and Duration min 1 x/week  2 weeks      SLP Evaluation Cognition  Overall Cognitive Status: Impaired/Different from baseline Arousal/Alertness: Awake/alert Orientation Level: Oriented to  person;Oriented to place;Disoriented to time;Disoriented to situation Attention: Sustained Sustained Attention: Impaired Sustained Attention Impairment: Verbal basic Memory: Impaired Memory Impairment: Retrieval deficit;Decreased recall of new information;Decreased short term memory Decreased Short Term Memory: Verbal basic Awareness: Impaired Awareness Impairment: Intellectual impairment Problem Solving: Impaired Problem Solving Impairment: Verbal basic Behaviors: Restless Safety/Judgment: Impaired       Comprehension  Auditory Comprehension Overall Auditory Comprehension: Impaired Commands: Impaired One Step Basic Commands: 75-100% accurate Multistep Basic  Commands: 0-24% accurate Conversation: Simple Interfering Components: Attention;Working Field seismologist: Repetition Reading Comprehension Reading Status: Within funtional limits    Expression Expression Primary Mode of Expression: Verbal Verbal Expression Overall Verbal Expression: Appears within functional limits for tasks assessed Initiation: No impairment Automatic Speech: Name;Social Response Level of Generative/Spontaneous Verbalization: Sentence Naming: No impairment Pragmatics: No impairment Interfering Components: Attention Non-Verbal Means of Communication: Not applicable   Oral / Motor  Oral Motor/Sensory Function Overall Oral Motor/Sensory Function: Within functional limits Motor Speech Overall Motor Speech: Appears within functional limits for tasks assessed Respiration: Within functional limits Phonation: Normal Resonance: Within functional limits Articulation: Within functional limitis Intelligibility: Intelligible Motor Planning: Witnin functional limits Motor Speech Errors: Not applicable   GO                   Shelly Flatten, MA, CCC-SLP Acute Rehab SLP [phone] Lamar Sprinkles 04/27/2018, 12:51 PM

## 2018-04-28 LAB — BASIC METABOLIC PANEL
ANION GAP: 9 (ref 5–15)
BUN: 6 mg/dL — ABNORMAL LOW (ref 8–23)
CALCIUM: 8.1 mg/dL — AB (ref 8.9–10.3)
CO2: 27 mmol/L (ref 22–32)
CREATININE: 0.54 mg/dL (ref 0.44–1.00)
Chloride: 100 mmol/L (ref 98–111)
Glucose, Bld: 122 mg/dL — ABNORMAL HIGH (ref 70–99)
Potassium: 3.2 mmol/L — ABNORMAL LOW (ref 3.5–5.1)
SODIUM: 136 mmol/L (ref 135–145)

## 2018-04-28 LAB — CBC WITH DIFFERENTIAL/PLATELET
Abs Immature Granulocytes: 0 10*3/uL (ref 0.0–0.1)
BASOS ABS: 0 10*3/uL (ref 0.0–0.1)
Basophils Relative: 0 %
EOS ABS: 0.2 10*3/uL (ref 0.0–0.7)
EOS PCT: 3 %
HCT: 34.9 % — ABNORMAL LOW (ref 36.0–46.0)
Hemoglobin: 10.6 g/dL — ABNORMAL LOW (ref 12.0–15.0)
IMMATURE GRANULOCYTES: 1 %
LYMPHS ABS: 1 10*3/uL (ref 0.7–4.0)
Lymphocytes Relative: 14 %
MCH: 26.2 pg (ref 26.0–34.0)
MCHC: 30.4 g/dL (ref 30.0–36.0)
MCV: 86.2 fL (ref 78.0–100.0)
Monocytes Absolute: 0.6 10*3/uL (ref 0.1–1.0)
Monocytes Relative: 8 %
NEUTROS PCT: 74 %
Neutro Abs: 5.5 10*3/uL (ref 1.7–7.7)
PLATELETS: 300 10*3/uL (ref 150–400)
RBC: 4.05 MIL/uL (ref 3.87–5.11)
RDW: 18.9 % — AB (ref 11.5–15.5)
WBC: 7.4 10*3/uL (ref 4.0–10.5)

## 2018-04-28 LAB — GLUCOSE, CAPILLARY
GLUCOSE-CAPILLARY: 131 mg/dL — AB (ref 70–99)
GLUCOSE-CAPILLARY: 153 mg/dL — AB (ref 70–99)
Glucose-Capillary: 147 mg/dL — ABNORMAL HIGH (ref 70–99)
Glucose-Capillary: 164 mg/dL — ABNORMAL HIGH (ref 70–99)

## 2018-04-28 MED ORDER — CEPHALEXIN 500 MG PO CAPS
500.0000 mg | ORAL_CAPSULE | Freq: Two times a day (BID) | ORAL | Status: DC
  Administered 2018-04-28 – 2018-04-29 (×3): 500 mg via ORAL
  Filled 2018-04-28 (×3): qty 1

## 2018-04-28 MED ORDER — POTASSIUM CHLORIDE 20 MEQ PO PACK
40.0000 meq | PACK | Freq: Once | ORAL | Status: DC
  Filled 2018-04-28: qty 2

## 2018-04-28 MED ORDER — POTASSIUM CHLORIDE 20 MEQ PO PACK
40.0000 meq | PACK | Freq: Once | ORAL | Status: AC
  Administered 2018-04-28: 40 meq via ORAL
  Filled 2018-04-28: qty 2

## 2018-04-28 NOTE — Progress Notes (Addendum)
PROGRESS NOTE    [name]  BWI:[id number] DOB: Aug 02, 1929 DOA: 04/25/2018 PCP: Deland Pretty, MD    Brief Narrative:  84 yo female who presented with dysarthria. She does have the past medical history of atrial fibrilaltion, hypothyroid, HTN, T2DM, RA and gerd. Patient not able to give detailed history, all information obtained from care giver at the bedside. Patient was noted to have acute dysarthria and facial droop. On the initial physical examinationhis blood pressure was 162/71, heart rate 88, respiratory 28, temperature 97.8, oxygen saturation 96%, dry mucous membranes,lungs clear to auscultation bilaterally, heart S1-S2 present and rhythmic, abdomen soft nontender, no lower extremity edema. Nonfocal, positive dysarthria. Sodium 139, potassium 3.9, chloride 102, bicarb 26, glucose 206, BUN 12, creatinine 0.81, white count 9.4, hemoglobin 10.7, hematocrit 35.5, platelets 317.Urinalysis 25-50 white cells, specific gravity 1.015, treated with hyperinflation, right basilar reticulonodular infiltrate, prominent on the left lateral film.MRI negative for acute changes. EKG sinus rhythm, PVCs and PACs,normal axis, normal intervals, chronic lateral T wave inversions.  Patient was admitted to the hospital with working diagnosis of acute metabolic encephalopathy to rule out CVA.   Assessment & Plan:   Active Problems:   Hypothyroidism   Type 2 diabetes mellitus with other specified complication (HCC)   Essential hypertension   Rheumatoid arthritis involving both hands with positive rheumatoid factor (HCC)   TIA (transient ischemic attack)   Pneumonia    1. Metabolic encephalopathy due to NEW community acquired pneumonia/ aspiration pneumonitis (present on admission). CVA has been ruled out. Continue to improve mentation, but not back to baseline, will continue antibiotic therapy with po cephalexin to complete 5 days, follow cell count and temperature curve, aspiration  precautions. Continue hydration with balanced electrolyte solutions at 50 ml per hour.   2. T2DM. Continue glucose cover and monitoring with insulin sliding scale, patient tolerating po well. Capillary glucose 203,l 201, 147, 131.   3. Hypothyroidism. continue with  levothyroxine.   4. RA. Continue pain control and physical therapy. Pending placement   5. Metabolic encephalopathy. Acute and intermittent, required lorazepam overnight. Will continue neuro checks and as needed haldol. Will consult nutrition for evaluation.   6. Dehydration with hypokalemia. Will continue balanced electrolyte solutions IV, will add po Kcl 40 meq once, follow on renal panel in am.  DVT prophylaxis:enoxaparin Code Status:full Family Communication:no family at the bedside Disposition Plan/ discharge barriers:SNF placement.    Consultants:    Procedures:    Antimicrobials:  Ceftriaxone IV  Azithromycin      Subjective: Patient is feeling better but not back to baseline, continue to have episodes of confusion, no nausea or vomiting, no abdominal pain. Had agitation that required haldol.   Objective: Vitals:   04/28/18 0019 04/28/18 0338 04/28/18 0348 04/28/18 0815  BP: 120/76 115/87 (!) 169/92 136/81  Pulse: 80 (!) 52 82 78  Resp:   16 16  Temp: 98.5 F (36.9 C) 98.4 F (36.9 C) 97.9 F (36.6 C) 98.1 F (36.7 C)  TempSrc: Oral Oral Oral   SpO2: 98% 100% (!) 89% 97%  Weight:      Height:        Intake/Output Summary (Last 24 hours) at 04/28/2018 1102 Last data filed at 04/28/2018 0655 Gross per 24 hour  Intake 1925.83 ml  Output 150 ml  Net 1775.83 ml   Filed Weights   04/25/18 1400 04/25/18 2002  Weight: 44.3 kg (97 lb 10.6 oz) 44.1 kg (97 lb 3.6 oz)    Examination:  General: Not in pain or dyspnea, deconditioned   Neurology: Awake and alert, non focal  E ENT: mild pallor, no icterus, oral mucosa moist Cardiovascular: No JVD. S1-S2 present, rhythmic, no  gallops, rubs, or murmurs. No lower extremity edema. Pulmonary: decreased breath sounds bilaterally, adequate air movement, no wheezing, rhonchi or rales. Gastrointestinal. Abdomen with, no organomegaly, non tender, no rebound or guarding Skin. No rashes Musculoskeletal: no joint deformities     Data Reviewed: I have personally reviewed following labs and imaging studies  CBC: Recent Labs  Lab 04/25/18 1415 04/25/18 1418 04/27/18 0553 04/28/18 0344  WBC 9.4  --  8.2 7.4  NEUTROABS 7.0  --  6.3 5.5  HGB 10.7* 11.9* 10.5* 10.6*  HCT 35.5* 35.0* 34.4* 34.9*  MCV 87.7  --  86.9 86.2  PLT 317  --  311 656   Basic Metabolic Panel: Recent Labs  Lab 04/25/18 1415 04/25/18 1418 04/27/18 0553 04/28/18 0344  NA 139 139 138 136  K 3.9 3.8 3.3* 3.2*  CL 102 101 102 100  CO2 26  --  27 27  GLUCOSE 206* 199* 239* 122*  BUN 12 13 9  6*  CREATININE 0.81 0.70 0.55 0.54  CALCIUM 8.8*  --  8.2* 8.1*   GFR: Estimated Creatinine Clearance: 34.5 mL/min (by C-G formula based on SCr of 0.54 mg/dL). Liver Function Tests: Recent Labs  Lab 04/25/18 1415  AST 15  ALT 15  ALKPHOS 101  BILITOT 0.8  PROT 6.0*  ALBUMIN 3.1*   No results for input(s): LIPASE, AMYLASE in the last 168 hours. No results for input(s): AMMONIA in the last 168 hours. Coagulation Profile: Recent Labs  Lab 04/25/18 1415  INR 1.04   Cardiac Enzymes: No results for input(s): CKTOTAL, CKMB, CKMBINDEX, TROPONINI in the last 168 hours. BNP (last 3 results) No results for input(s): PROBNP in the last 8760 hours. HbA1C: Recent Labs    04/26/18 0513  HGBA1C 6.4*   CBG: Recent Labs  Lab 04/27/18 0611 04/27/18 1131 04/27/18 1644 04/27/18 2056 04/28/18 0704  GLUCAP 219* 214* 203* 210* 147*   Lipid Profile: Recent Labs    04/26/18 0513  CHOL 143  HDL 40*  LDLCALC 68  TRIG 173*  CHOLHDL 3.6   Thyroid Function Tests: Recent Labs    04/26/18 0513  TSH 2.296   Anemia Panel: No results for  input(s): VITAMINB12, FOLATE, FERRITIN, TIBC, IRON, RETICCTPCT in the last 72 hours.    Radiology Studies: I have reviewed all of the imaging during this hospital visit personally     Scheduled Meds: . cephALEXin  500 mg Oral Q12H  . enoxaparin (LOVENOX) injection  30 mg Subcutaneous Q24H  . insulin aspart  0-5 Units Subcutaneous QHS  . insulin aspart  0-9 Units Subcutaneous TID WC  . leflunomide  20 mg Oral Daily  . levothyroxine  100 mcg Oral QAC breakfast  . lidocaine  1 patch Transdermal Q24H  . magnesium oxide  200 mg Oral BID  . pantoprazole  80 mg Oral Daily  . potassium chloride  40 mEq Oral Once  . predniSONE  4 mg Oral Q breakfast   Continuous Infusions: . lactated ringers 50 mL/hr at 04/26/18 1619     LOS: 2 days        Tawni Millers, MD [facility] Pager [phone]

## 2018-04-29 ENCOUNTER — Encounter (HOSPITAL_BASED_OUTPATIENT_CLINIC_OR_DEPARTMENT_OTHER): Payer: Medicare Other | Attending: Internal Medicine

## 2018-04-29 DIAGNOSIS — L97822 Non-pressure chronic ulcer of other part of left lower leg with fat layer exposed: Secondary | ICD-10-CM | POA: Insufficient documentation

## 2018-04-29 DIAGNOSIS — L97812 Non-pressure chronic ulcer of other part of right lower leg with fat layer exposed: Secondary | ICD-10-CM | POA: Insufficient documentation

## 2018-04-29 DIAGNOSIS — E11622 Type 2 diabetes mellitus with other skin ulcer: Secondary | ICD-10-CM | POA: Insufficient documentation

## 2018-04-29 DIAGNOSIS — E1142 Type 2 diabetes mellitus with diabetic polyneuropathy: Secondary | ICD-10-CM | POA: Insufficient documentation

## 2018-04-29 DIAGNOSIS — I10 Essential (primary) hypertension: Secondary | ICD-10-CM | POA: Insufficient documentation

## 2018-04-29 DIAGNOSIS — E11621 Type 2 diabetes mellitus with foot ulcer: Secondary | ICD-10-CM | POA: Insufficient documentation

## 2018-04-29 DIAGNOSIS — Z8500 Personal history of malignant neoplasm of unspecified digestive organ: Secondary | ICD-10-CM | POA: Insufficient documentation

## 2018-04-29 DIAGNOSIS — Z7984 Long term (current) use of oral hypoglycemic drugs: Secondary | ICD-10-CM | POA: Insufficient documentation

## 2018-04-29 DIAGNOSIS — L97522 Non-pressure chronic ulcer of other part of left foot with fat layer exposed: Secondary | ICD-10-CM | POA: Insufficient documentation

## 2018-04-29 LAB — GLUCOSE, CAPILLARY
GLUCOSE-CAPILLARY: 294 mg/dL — AB (ref 70–99)
Glucose-Capillary: 156 mg/dL — ABNORMAL HIGH (ref 70–99)

## 2018-04-29 MED ORDER — HYDRALAZINE HCL 20 MG/ML IJ SOLN
10.0000 mg | Freq: Once | INTRAMUSCULAR | Status: AC
  Administered 2018-04-29: 10 mg via INTRAVENOUS
  Filled 2018-04-29: qty 1

## 2018-04-29 MED ORDER — CEPHALEXIN 500 MG PO CAPS: 500.0000 mg | ORAL_CAPSULE | Freq: Two times a day (BID) | ORAL | 0 refills | Status: AC

## 2018-04-29 NOTE — Progress Notes (Signed)
CSW alerted by MD that patient's son has requested that patient return home; no longer pursuing SNF placement.  CSW signing off.  Laveda Abbe, [place] Clinical Social Worker [phone]

## 2018-04-29 NOTE — Progress Notes (Signed)
Occupational Therapy Treatment Patient Details Name: [name] MRN: [id number] DOB: 16-Aug-1929 Today's Date: 04/29/2018    History of present illness 84 y.o. female with medical history significant of Hypothyroidism, HTN, DM2, RA, GERD, Afib who presented for dysarthria. MRI negative for acute stroke.    OT comments  Pt with limited participation due to anxious about mobility and tangential with discussion and therefore often forgetting about task at hand.  Required max encouragement for participation in therapeutic activity to address mobility and toilet transfers. Pt alternating between requesting to use periwick/catheter and willing to transfer to [facility], ultimately willing to use BSC.  Max assist squat pivot transfer with +2 for safety due to pt anxiousness with mobility.  Pt would benefit from SNF prior to d/c home to decrease burden of care on caregivers. Pt reports she has caregivers majority of the day which could be made into 24 hours/day and that she would prefer to d/c home.  Follow Up Recommendations  SNF;Supervision/Assistance - 24 hour(or adequate 24/7 supervision/assist at home and [facility])    Equipment Recommendations  Tub/shower bench       Precautions / Restrictions Precautions Precautions: Fall Required Braces or Orthoses: Other Brace/Splint Other Brace/Splint: Lt surgical shoe Restrictions Weight Bearing Restrictions: No       Mobility Bed Mobility Overal bed mobility: Needs Assistance Bed Mobility: Supine to Sit     Supine to sit: Min assist        Transfers Overall transfer level: Needs assistance Equipment used: Rolling walker (2 wheeled) Transfers: Public house manager;Sit to/from Stand Sit to Stand: +2 safety/equipment;Max assist   Squat pivot transfers: +2 physical assistance;+2 safety/equipment;Max assist     General transfer comment: Attempted sit > stand with RW, however pt with increased posterior lean and requiring increased assist;  therefore completed sit > stand with therapist assist and max assist squat pivot max assist +2 for safety        ADL either performed or assessed with clinical judgement   ADL Overall ADL's : Needs assistance/impaired     Grooming: Set up;Minimal assistance;Sitting                   Toilet Transfer: Maximal assistance;+2 for safety/equipment;Squat-pivot;BSC   Toileting- Clothing Manipulation and Hygiene: Total assistance;+2 for physical assistance;Sit to/from stand       Functional mobility during ADLs: Maximal assistance;+2 for physical assistance;+2 for safety/equipment General ADL Comments: Completed squat pivot transfer to [facility] with pt requiring max assist +2 and max cues for initiation and sequencing due to internally distracted and anxious and fearful with movements.  Pt demonstrating decreased stability in standing with RW this session, requiring assistance from therapist for standing while 2nd therapist completed hygiene post toileting.                 Cognition Arousal/Alertness: Awake/alert Behavior During Therapy: WFL for tasks assessed/performed Overall Cognitive Status: Impaired/Different from baseline                       Memory: Decreased short-term memory   Safety/Judgement: Decreased awareness of safety;Decreased awareness of deficits   Problem Solving: Slow processing;Difficulty sequencing;Requires verbal cues;Requires tactile cues                     Pertinent Vitals/ Pain       Pain Assessment: No/denies pain         Frequency  Min 2X/week        Progress  Toward Goals  OT Goals(current goals can now be found in the care plan section)  Progress towards OT goals: Progressing toward goals  Acute Rehab OT Goals Patient Stated Goal: go home Time For Goal Achievement: 05/03/18 Potential to Achieve Goals: [place] Discharge plan remains appropriate    Co-evaluation    PT/OT/SLP Co-Evaluation/Treatment: Yes Reason  for Co-Treatment: Necessary to address cognition/behavior during functional activity;To address functional/ADL transfers;Complexity of the patient's impairments (multi-system involvement)   OT goals addressed during session: ADL's and self-care      AM-PAC PT "6 Clicks" Daily Activity     Outcome Measure   Help from another person eating meals?: A Little Help from another person taking care of personal grooming?: A Little Help from another person toileting, which includes using toliet, bedpan, or urinal?: Total Help from another person bathing (including washing, rinsing, drying)?: A Lot Help from another person to put on and taking off regular upper body clothing?: A Lot Help from another person to put on and taking off regular lower body clothing?: Total 6 Click Score: 12    End of Session Equipment Utilized During Treatment: Rolling walker  OT Visit Diagnosis: Unsteadiness on feet (R26.81);Muscle weakness (generalized) (M62.81)   Activity Tolerance Treatment limited secondary to agitation   Patient Left in chair(with personal caregiver)   Nurse Communication Mobility status        Time: [phone] OT Time Calculation (min): 53 min  Charges: OT Treatments $Self Care/Home Management : 23-37 mins    [name], [phone] 04/29/2018, 11:01 AM

## 2018-04-29 NOTE — Care Management Note (Addendum)
Case Management Note  Patient Details  Name: [name] MRN: [id number] Date of Birth: [birth year]  Subjective/Objective:      Pt in with hypothyroidism. She is from home with caregivers.               Action/Plan: Recommendations are for SNF. MD spoke to son and he would like her to go home with increase in caregiver support. Per aide at bedside she will be having 24/7 caregivers at d/c.  MD placed orders for [facility] services. CM provided the patient choice and she selected [facility]. Butch Penny with [facility] notified and accepted the referral. CM updated the son about the [facility] services and what his mother had chosen. She asked that [facility] not start until Friday. Butch Penny and patients son made aware of her request.  Aide is to provide transportation home.   Addendum: 12:30 pm: pt was already active with [facility] for [facility] services. Pt made aware and Malachy Mood with [facility] made aware of patients d/c and new orders.  Expected Discharge Date:  04/29/18               Expected Discharge Plan:  [place]  In-House Referral:     Discharge planning Services  CM Consult  Post Acute Care Choice:  Home Health Choice offered to:  Patient, Adult Children  DME Arranged:    DME Agency:     HH Arranged:  RN, PT, OT, Speech Therapy [place] Agency:  [place]  Status of Service:  Completed, signed off  If discussed at [place] of Stay Meetings, dates discussed:    Additional Comments:  Pollie Friar, RN 04/29/2018, 10:44 AM

## 2018-04-29 NOTE — Discharge Summary (Signed)
Physician Discharge Summary  [name] IOE:[id number] DOB: 1928/11/18 DOA: 04/25/2018  PCP: Deland Pretty, MD  Admit date: 04/25/2018 Discharge date: 04/29/2018  Admitted From: Home  Disposition:  Home   Recommendations for Outpatient Follow-up and new medication changes:  1. Follow up with Dr. Shelia Media in 7 days 2. Continue cephalexin for 5 days  Home Health: yes   Equipment/Devices: wheelchair    Discharge Condition: stable  CODE STATUS: full  Diet recommendation: heart healthy and diabetic prudent   Brief/Interim Summary: 84 yo female who presented with dysarthria. She does have the past medical history of atrial fibrilaltion, hypothyroid, HTN, T2DM, RA and gerd. Patient not able to give detailed history, all information obtained from care giver at the bedside. Patient was noted to have acute dysarthria and facial droop. On the initial physical examinationhis blood pressure was 162/71, heart rate 88, respiratory rate 28, temperature 97.8, oxygen saturation 96%, dry mucous membranes,lungs clear to auscultation bilaterally, heart S1-S2 present and rhythmic, abdomen soft nontender, no lower extremity edema.  Neurologically positive dysarthria. Sodium 139, potassium 3.9, chloride 102, bicarb 26, glucose 206, BUN 12, creatinine 0.81, white count 9.4, hemoglobin 10.7, hematocrit 35.5, platelets 317.Urinalysis 25-50 white cells, specific gravity 1.015, chest x-ray with hyperinflation, right basilar reticulonodular infiltrate, prominent on the left lateral film.MRI negative for acute changes. EKG sinus rhythm, PVCs and PACs,normal axis, normal intervals, chronic lateral T wave inversions.  Patient was admitted to the hospital with working diagnosis of acute metabolic encephalopathy to rule out CVA, complicated by pneumonia.  1.  Metabolic encephalopathy due to right lower lobe/left lower lobe pneumonia, aspiration pneumonitis.  Patient was admitted to the medical ward, she was placed  on a remote telemetry monitor, received IV fluids and was placed on antibiotic therapy with IV ceftriaxone and oral azithromycin.  Follow-up chest films showed improvement of infiltrates, suggesting aspiration pneumonitis, patient will complete course of antibiotics with 5 more days of cephalexin.  Further work-up with echocardiography showed normal LV systolic function 50 to 18%, with mild to moderate calcific aortic stenosis, carotid ultrasonography was negative for significant stenosis, brain MRI/MRA negative for acute changes.  Considering patient's clinical presentation and neurologic work-up, cerebrovascular accident was ruled out.  2.  Dehydration with hypokalemia.  Patient received IV fluids and potassium supplementation, May 5 creatinine 0.54, potassium 3.2.  She declined further blood work on the day of discharge.  She has been tolerating p.o. diet adequately.  3.  Type 2 diabetes mellitus.  Patient was placed on insulin sliding scale for glucose coverage and monitoring, at home she will resume oral hypoglycemic agents, with metformin and Tradjenta.  4.  Hypothyroidism.  Continue levothyroxine.  5.  Severe deforming rheumatoid arthritis.  Patient will continue prednisone, arava and topical dicolfenac.  Patient has a very poor physical functional capacity, physical therapy recommended skilled nursing facility.  She does have home care, and uses a wheelchair.  I did discuss disposition with her son over the phone and at this point likely patient will be better at home considering her quality of life and prognosis.  Will add home health therapy and home nursing.    Discharge Diagnoses:  Active Problems:   Hypothyroidism   Type 2 diabetes mellitus with other specified complication [facility])   Essential hypertension   Rheumatoid arthritis involving both hands with positive rheumatoid factor (HCC)   TIA (transient ischemic attack)   Pneumonia    Discharge Instructions   Allergies as of  04/29/2018   No Known Allergies  Medication List    TAKE these medications   cephALEXin 500 MG capsule Commonly known as:  KEFLEX Take 1 capsule (500 mg total) by mouth every 12 (twelve) hours for 5 days.   feeding supplement (ENSURE ENLIVE) Liqd Take 237 mLs by mouth 2 (two) times daily between meals.   ferrous sulfate 220 (44 Fe) MG/5ML solution Take 660 mg by mouth daily.   leflunomide 20 MG tablet Commonly known as:  ARAVA Take 20 mg by mouth daily.   levothyroxine 100 MCG tablet Commonly known as:  SYNTHROID Take 1 tablet (100 mcg total) by mouth daily.   magnesium oxide 400 (241.3 Mg) MG tablet Commonly known as:  MAG-OX Take 0.5 tablets (200 mg total) by mouth 2 (two) times daily.   Melatonin 2.5 MG Caps Take 1 capsule (2.5 mg total) by mouth at bedtime as needed (for sleep). What changed:  when to take this   metFORMIN 500 MG 24 hr tablet Commonly known as:  GLUCOPHAGE-XR Take 500-1,000 mg by mouth 2 (two) times daily. Take 1000 mg by mouth in the morning and 500 mg by mouth in the evening   omeprazole 40 MG capsule Commonly known as:  PRILOSEC Take 1 capsule (40 mg total) by mouth 2 (two) times daily.   predniSONE 1 MG tablet Commonly known as:  DELTASONE Take 4 mg by mouth daily with breakfast.   TRADJENTA 5 MG Tabs tablet Generic drug:  linagliptin Take 5 mg by mouth daily.   VOLTAREN 1 % Gel Generic drug:  diclofenac sodium Apply 2 g topically 4 (four) times daily.       No Known Allergies  Consultations:     Procedures/Studies: Dg Chest 2 View  Result Date: 04/26/2018 CLINICAL DATA:  Dyspnea EXAM: CHEST - 2 VIEW COMPARISON:  04/25/2018 FINDINGS: Cardiac shadow is enlarged. Aortic calcifications are again seen. The lungs are well aerated bilaterally. Mild left basilar atelectasis is noted. Small bilateral pleural effusions are noted posteriorly. No acute bony abnormality is noted. IMPRESSION: Small effusions.  Stable left basilar  atelectasis. Electronically Signed   By: Inez Catalina M.D.   On: 04/26/2018 16:14   Dg Chest 2 View  Result Date: 04/25/2018 CLINICAL DATA:  TIA EXAM: CHEST - 2 VIEW COMPARISON:  03/11/2018 FINDINGS: AP and lateral views of the chest show marked hyperexpansion. The cardio pericardial silhouette is enlarged. Airspace disease projecting posteriorly over the spine on the lateral film suggests pneumonia, likely in the left base. Diffuse bony demineralization compatible the osteopenia. Telemetry leads overlie the chest. IMPRESSION: Cardiomegaly with basilar airspace disease on the left suggesting pneumonia. Electronically Signed   By: Misty Stanley M.D.   On: 04/25/2018 21:45   Mr Jodene Nam Head Wo Contrast  Result Date: 04/25/2018 CLINICAL DATA:  84 year old female with slurred speech and left facial droop, transient symptoms. Diffusion-weighted FLAIR, and T2 MRI imaging requested along with MRA. EXAM: MRI HEAD WITHOUT CONTRAST LIMITED MRA HEAD WITHOUT CONTRAST TECHNIQUE: Multiple pulse sequences of the brain and surrounding structures were obtained without intravenous contrast. Angiographic images of the head were obtained using MRA technique without contrast. COMPARISON:  Head CT without contrast 1426 hours today. Brain MRI 02/25/2018. FINDINGS: MRI HEAD FINDINGS Brain: No restricted diffusion to suggest acute infarction. No midline shift, mass effect, evidence of mass lesion, ventriculomegaly, extra-axial collection or acute intracranial hemorrhage. Cervicomedullary junction and pituitary are within normal limits. Stable cerebral volume. Patchy and confluent bilateral cerebral white matter T2 and FLAIR hyperintensity appears stable since June. No new  T2 or FLAIR signal abnormality identified. Vascular: Major intracranial vascular flow voids are stable. Mild generalized intracranial artery tortuosity. Skull and upper cervical spine: Stable visible cervical spine. Visualized bone marrow signal is within normal limits.  Sinuses/Orbits: Stable and negative. Other: Mastoid air cells remain clear. Visible scalp and face soft tissues appear negative. MRA HEAD FINDINGS Antegrade flow in the posterior circulation. Mildly dominant distal left vertebral artery. Patent left PICA origin. No significant distal vertebral stenosis. Patent vertebrobasilar junction. Dominant appearing right AICA. Patent basilar artery without stenosis. Normal SCA and PCA origins. Posterior communicating arteries are diminutive or absent. Bilateral PCA branches are within normal limits. Antegrade flow in both ICA siphons. Tortuous bilateral cavernous segments. No siphon stenosis. Normal ophthalmic artery origins. Patent carotid termini. Normal MCA and ACA origins. Anterior communicating artery and visible ACA branches are within normal limits. Bilateral MCA M1 segments and MCA bifurcations are patent without stenosis. Visible bilateral MCA branches are within normal limits. IMPRESSION: 1.  No acute intracranial abnormality. 2. Stable limited MRI appearance of the brain compared to 02/25/2018. 3. Negative intracranial MRA; mild for age intracranial atherosclerosis. Electronically Signed   By: Genevie Ann M.D.   On: 04/25/2018 17:05   Mr Brain Wo Contrast  Result Date: 04/25/2018 CLINICAL DATA:  84 year old female with slurred speech and left facial droop, transient symptoms. Diffusion-weighted FLAIR, and T2 MRI imaging requested along with MRA. EXAM: MRI HEAD WITHOUT CONTRAST LIMITED MRA HEAD WITHOUT CONTRAST TECHNIQUE: Multiple pulse sequences of the brain and surrounding structures were obtained without intravenous contrast. Angiographic images of the head were obtained using MRA technique without contrast. COMPARISON:  Head CT without contrast 1426 hours today. Brain MRI 02/25/2018. FINDINGS: MRI HEAD FINDINGS Brain: No restricted diffusion to suggest acute infarction. No midline shift, mass effect, evidence of mass lesion, ventriculomegaly, extra-axial  collection or acute intracranial hemorrhage. Cervicomedullary junction and pituitary are within normal limits. Stable cerebral volume. Patchy and confluent bilateral cerebral white matter T2 and FLAIR hyperintensity appears stable since June. No new T2 or FLAIR signal abnormality identified. Vascular: Major intracranial vascular flow voids are stable. Mild generalized intracranial artery tortuosity. Skull and upper cervical spine: Stable visible cervical spine. Visualized bone marrow signal is within normal limits. Sinuses/Orbits: Stable and negative. Other: Mastoid air cells remain clear. Visible scalp and face soft tissues appear negative. MRA HEAD FINDINGS Antegrade flow in the posterior circulation. Mildly dominant distal left vertebral artery. Patent left PICA origin. No significant distal vertebral stenosis. Patent vertebrobasilar junction. Dominant appearing right AICA. Patent basilar artery without stenosis. Normal SCA and PCA origins. Posterior communicating arteries are diminutive or absent. Bilateral PCA branches are within normal limits. Antegrade flow in both ICA siphons. Tortuous bilateral cavernous segments. No siphon stenosis. Normal ophthalmic artery origins. Patent carotid termini. Normal MCA and ACA origins. Anterior communicating artery and visible ACA branches are within normal limits. Bilateral MCA M1 segments and MCA bifurcations are patent without stenosis. Visible bilateral MCA branches are within normal limits. IMPRESSION: 1.  No acute intracranial abnormality. 2. Stable limited MRI appearance of the brain compared to 02/25/2018. 3. Negative intracranial MRA; mild for age intracranial atherosclerosis. Electronically Signed   By: Genevie Ann M.D.   On: 04/25/2018 17:05   Ct Head Code Stroke Wo Contrast  Result Date: 04/25/2018 CLINICAL DATA:  Code stroke. Slurred speech and altered mental status. Code stroke EXAM: CT HEAD WITHOUT CONTRAST TECHNIQUE: Contiguous axial images were obtained from  the base of the skull through the vertex without intravenous contrast.  COMPARISON:  03/11/2018 FINDINGS: Brain: No evidence of acute infarction, hemorrhage, hydrocephalus, extra-axial collection or mass lesion/mass effect. Generalized atrophy. Extensive low-density in the cerebral white matter attributed to chronic small vessel ischemia. Vascular: Atherosclerotic calcification.  No hyperdense vessel Skull: Negative for fracture Sinuses/Orbits: Bilateral cataract resection Other: These results were communicated to Dr. Cheral Marker at 2:31 pmon 8/2/2019by text page via the [facility] messaging system. ASPECTS [facility] Stroke Program Early CT Score) - Ganglionic level infarction (caudate, lentiform nuclei, internal capsule, insula, M1-M3 cortex): 7 - Supraganglionic infarction (M4-M6 cortex): 3 Total score (0-10 with 10 being normal): 10 IMPRESSION: 1. No acute finding. 2. Generalized atrophy and chronic small vessel ischemia. Electronically Signed   By: Monte Fantasia M.D.   On: 04/25/2018 14:32       Subjective: Patient is feeling better, back to her baseline, no significant confusion. No nausea or vomiting.   Discharge Exam: Vitals:   04/29/18 0353 04/29/18 0826  BP: 140/78 112/82  Pulse: 87 91  Resp: 20 19  Temp: 97.6 F (36.4 C) 97.7 F (36.5 C)  SpO2: 100% 97%   Vitals:   04/28/18 1939 04/28/18 2332 04/29/18 0353 04/29/18 0826  BP: (!) 151/83 (!) 176/106 140/78 112/82  Pulse: 77 90 87 91  Resp: 20 20 20 19   Temp: 98.2 F (36.8 C) 98.7 F (37.1 C) 97.6 F (36.4 C) 97.7 F (36.5 C)  TempSrc: Oral Oral Oral Oral  SpO2: 99% 100% 100% 97%  Weight:      Height:        General: Not in pain or dyspnea, deconditioned  Neurology: Awake and alert, non focal  E ENT: no pallor, no icterus, oral mucosa moist Cardiovascular: No JVD. S1-S2 present, rhythmic, no gallops, rubs, 3/6 parasternal systolic murmur. No lower extremity edema. Pulmonary: vesicular breath sounds bilaterally, adequate air  movement, no wheezing, rhonchi or rales. Gastrointestinal. Abdomen with no organomegaly, non tender, no rebound or guarding Skin. No rashes Musculoskeletal: significant proximal hand metacarpal phalangeal joint deformities, as well as the metatarsal toe deformities   The results of significant diagnostics from this hospitalization (including imaging, microbiology, ancillary and laboratory) are listed below for reference.     Microbiology: Recent Results (from the past 240 hour(s))  Culture, blood (routine x 2)     Status: None (Preliminary result)   Collection Time: 04/26/18  2:38 PM  Result Value Ref Range Status   Specimen Description BLOOD LEFT HAND  Final   Special Requests   Final    BOTTLES DRAWN AEROBIC ONLY Blood Culture adequate volume   Culture   Final    NO GROWTH 2 DAYS Performed at [facility], [address] [place], [place] [address]    Report Status PENDING  Incomplete  Culture, blood (routine x 2)     Status: None (Preliminary result)   Collection Time: 04/26/18  2:42 PM  Result Value Ref Range Status   Specimen Description BLOOD LEFT FOREARM  Final   Special Requests   Final    BOTTLES DRAWN AEROBIC ONLY Blood Culture adequate volume   Culture   Final    NO GROWTH 2 DAYS Performed at [facility], [place] [address] [place], [place] [address]    Report Status PENDING  Incomplete  Culture, Urine     Status: None   Collection Time: 04/26/18  4:05 PM  Result Value Ref Range Status   Specimen Description URINE, CLEAN CATCH  Final   Special Requests NONE  Final  Culture   Final    NO GROWTH Performed at [facility], [place] [address] [place], [place] [address]    Report Status 04/27/2018 FINAL  Final     Labs: BNP (last 3 results) No results for input(s): BNP in the last 8760 hours. Basic Metabolic Panel: Recent Labs  Lab 04/25/18 1415 04/25/18 1418 04/27/18 0553 04/28/18 0344  NA 139 139 138 136  K 3.9 3.8 3.3* 3.2*  CL 102 101  102 100  CO2 26  --  27 27  GLUCOSE 206* 199* 239* 122*  BUN 12 13 9  6*  CREATININE 0.81 0.70 0.55 0.54  CALCIUM 8.8*  --  8.2* 8.1*   Liver Function Tests: Recent Labs  Lab 04/25/18 1415  AST 15  ALT 15  ALKPHOS 101  BILITOT 0.8  PROT 6.0*  ALBUMIN 3.1*   No results for input(s): LIPASE, AMYLASE in the last 168 hours. No results for input(s): AMMONIA in the last 168 hours. CBC: Recent Labs  Lab 04/25/18 1415 04/25/18 1418 04/27/18 0553 04/28/18 0344  WBC 9.4  --  8.2 7.4  NEUTROABS 7.0  --  6.3 5.5  HGB 10.7* 11.9* 10.5* 10.6*  HCT 35.5* 35.0* 34.4* 34.9*  MCV 87.7  --  86.9 86.2  PLT 317  --  311 300   Cardiac Enzymes: No results for input(s): CKTOTAL, CKMB, CKMBINDEX, TROPONINI in the last 168 hours. BNP: Invalid input(s): POCBNP CBG: Recent Labs  Lab 04/28/18 0704 04/28/18 1140 04/28/18 1649 04/28/18 2129 04/29/18 0620  GLUCAP 147* 131* 153* 164* 156*   D-Dimer No results for input(s): DDIMER in the last 72 hours. Hgb A1c No results for input(s): HGBA1C in the last 72 hours. Lipid Profile No results for input(s): CHOL, HDL, LDLCALC, TRIG, CHOLHDL, LDLDIRECT in the last 72 hours. Thyroid function studies No results for input(s): TSH, T4TOTAL, T3FREE, THYROIDAB in the last 72 hours.  Invalid input(s): FREET3 Anemia work up No results for input(s): VITAMINB12, FOLATE, FERRITIN, TIBC, IRON, RETICCTPCT in the last 72 hours. Urinalysis    Component Value Date/Time   COLORURINE YELLOW 04/25/2018 1758   APPEARANCEUR CLEAR 04/25/2018 1758   LABSPEC 1.015 04/25/2018 1758   PHURINE 7.0 04/25/2018 1758   GLUCOSEU 50 (A) 04/25/2018 1758   HGBUR NEGATIVE 04/25/2018 1758   HGBUR negative 03/31/2009 1047   BILIRUBINUR NEGATIVE 04/25/2018 1758   KETONESUR NEGATIVE 04/25/2018 1758   PROTEINUR NEGATIVE 04/25/2018 1758   UROBILINOGEN 0.2 08/28/2012 0543   NITRITE NEGATIVE 04/25/2018 1758   LEUKOCYTESUR TRACE (A) 04/25/2018 1758   Sepsis Labs Invalid  input(s): PROCALCITONIN,  WBC,  LACTICIDVEN Microbiology Recent Results (from the past 240 hour(s))  Culture, blood (routine x 2)     Status: None (Preliminary result)   Collection Time: 04/26/18  2:38 PM  Result Value Ref Range Status   Specimen Description BLOOD LEFT HAND  Final   Special Requests   Final    BOTTLES DRAWN AEROBIC ONLY Blood Culture adequate volume   Culture   Final    NO GROWTH 2 DAYS Performed at [facility], [place] [address], [place], [place] [address]    Report Status PENDING  Incomplete  Culture, blood (routine x 2)     Status: None (Preliminary result)   Collection Time: 04/26/18  2:42 PM  Result Value Ref Range Status   Specimen Description BLOOD LEFT FOREARM  Final   Special Requests   Final    BOTTLES DRAWN AEROBIC ONLY Blood Culture adequate  volume   Culture   Final    NO GROWTH 2 DAYS Performed at [facility], [place] [address] [place], [place] [address]    Report Status PENDING  Incomplete  Culture, Urine     Status: None   Collection Time: 04/26/18  4:05 PM  Result Value Ref Range Status   Specimen Description URINE, CLEAN CATCH  Final   Special Requests NONE  Final   Culture   Final    NO GROWTH Performed at [facility], [place] [address], [place], [place] [address]    Report Status 04/27/2018 FINAL  Final     Time coordinating discharge: 45 minutes  SIGNED:   Tawni Millers, MD  [facility] 04/29/2018, 9:40 AM Pager [phone]  If 7PM-7AM, please contact night-coverage [email or web address] Password [email or web address]

## 2018-04-29 NOTE — Care Management Important Message (Signed)
Important Message  Patient Details  Name: [name] MRN: [id number] Date of Birth: [birth year]   Medicare Important Message Given:  Yes    [name] 04/29/2018, 4:16 PM

## 2018-04-29 NOTE — Progress Notes (Signed)
Patient and caregiver given discharge instructions and teaching. Able to verbalize understanding and able to teach back. No new questions or concerns. IV and telemetry removed.Discharged from unit by staff in wheelchair, caregiver will transport home.Signed d/c summary in chart.

## 2018-04-29 NOTE — Progress Notes (Signed)
Physical Therapy Treatment Patient Details Name: [name] MRN: [id number] DOB: 07-20-1929 Today's Date: 04/29/2018    History of Present Illness 83 y.o. female with medical history significant of Hypothyroidism, HTN, DM2, RA, GERD, Afib who presented for dysarthria. MRI negative for acute stroke.     PT Comments    Patient requires max A +2 for safety with all OOB transfers. Pt will benefit from further skilled PT services in post acute setting to maximize independence and safety with mobility.    Follow Up Recommendations  SNF     Equipment Recommendations  None recommended by PT    Recommendations for Other Services       Precautions / Restrictions Precautions Precautions: Fall Required Braces or Orthoses: Other Brace/Splint Other Brace/Splint: Lt surgical shoe Restrictions Weight Bearing Restrictions: No    Mobility  Bed Mobility Overal bed mobility: Needs Assistance Bed Mobility: Supine to Sit     Supine to sit: Min assist     General bed mobility comments: assistance to elevate trunk into sitting; HOB elevated and use of rail  Transfers Overall transfer level: Needs assistance Equipment used: Rolling walker (2 wheeled)(and face to face with gait belt) Transfers: Sit to/from [employer] Sit to Stand: Max assist;+2 safety/equipment   Squat pivot transfers: Max assist;+2 physical assistance;+2 safety/equipment     General transfer comment: +2 for safe squat pivot EOB to BSC and BSC to recliner; face to face with gait belt and knees blocked and cues for sequencing; attempted to stand with use of RW for hygiene however pt with heavy posterior bias and unable to maintain requiring assistance to sit back down on [facility]  Ambulation/Gait                 Stairs             Wheelchair Mobility    Modified Rankin (Stroke Patients Only)       Balance Overall balance assessment: Needs assistance Sitting-balance support:  Bilateral upper extremity supported;Feet supported Sitting balance-Leahy Scale: Fair       Standing balance-Leahy Scale: Zero Standing balance comment: pt requires max A  to maintain squat position in standing                            Cognition Arousal/Alertness: Awake/alert Behavior During Therapy: WFL for tasks assessed/performed Overall Cognitive Status: Impaired/Different from baseline                       Memory: Decreased short-term memory   Safety/Judgement: Decreased awareness of safety;Decreased awareness of deficits   Problem Solving: Slow processing;Difficulty sequencing;Requires verbal cues;Requires tactile cues        Exercises      General Comments        Pertinent Vitals/Pain Pain Assessment: No/denies pain    Home Living                      Prior Function            PT Goals (current goals can now be found in the care plan section) Acute Rehab PT Goals Patient Stated Goal: go home Progress towards PT goals: Progressing toward goals    Frequency    Min 2X/week      PT Plan Current plan remains appropriate    Co-evaluation PT/OT/SLP Co-Evaluation/Treatment: Yes Reason for Co-Treatment: Complexity of the patient's impairments (multi-system involvement);Necessary to address cognition/behavior  during functional activity;For patient/therapist safety;To address functional/ADL transfers PT goals addressed during session: Mobility/safety with mobility OT goals addressed during session: ADL's and self-care      AM-PAC PT "6 Clicks" Daily Activity  Outcome Measure  Difficulty turning over in bed (including adjusting bedclothes, sheets and blankets)?: A Lot Difficulty moving from lying on back to sitting on the side of the bed? : A Lot Difficulty sitting down on and standing up from a chair with arms (e.g., wheelchair, bedside commode, etc,.)?: Unable Help needed moving to and from a bed to chair (including a  wheelchair)?: A Lot Help needed walking in hospital room?: Total Help needed climbing 3-5 steps with a railing? : Total 6 Click Score: 9    End of Session Equipment Utilized During Treatment: Gait belt Activity Tolerance: Patient tolerated treatment well Patient left: in chair;with call bell/phone within reach;with family/visitor present;Other (comment)(pt's personal caregiver present ) Nurse Communication: Mobility status PT Visit Diagnosis: Unsteadiness on feet (R26.81);Muscle weakness (generalized) (M62.81);Difficulty in walking, not elsewhere classified (R26.2)     Time: 0947-0962 PT Time Calculation (min) (ACUTE ONLY): 53 min  Charges:  $Therapeutic Activity: 23-37 mins                     Earney Navy, PTA Pager: [phone]     Darliss Cheney 04/29/2018, 11:58 AM

## 2018-04-29 NOTE — Progress Notes (Signed)
No legal guardian identified

## 2018-04-29 NOTE — Progress Notes (Signed)
  Speech Language Pathology Treatment: Cognitive-Linquistic  Patient Details Name: [name] MRN: [id number] DOB: 1928-11-05 Today's Date: 04/29/2018 Time: 6468-0321 SLP Time Calculation (min) (ACUTE ONLY): 16 min  Assessment / Plan / Recommendation Clinical Impression  Skilled treatment session focused on cognition goals. SLP facilitated session by providing Max A question cues to recall previous therapies from morning. Pt also required Max A cues to demonstrate selective attention for ~ 5 minutes. Pt with off-topic tangential comments in response to recall questions. Pt with poor tolerance of cues to return to previous topic. Per chart review, son is agreeable to SNF placement. Continue to recommend SNF placement with further follow up in that setting. ST to sign off acutely.    HPI HPI: [name] is a 84 y.o. female with medical history significant of Hypothyroidism, HTN, DM2, RA, GERd, Afib who presents for dysarthria.  Unfortunately, patient is alone and unable to give much history. History obtained from discussion with EDP and review of chart.  Apparently, patient presented with dysarthria and facial droop noted by caregiver and was brought to the ED.  Neurology evaluated and initial symptoms were apparently resolved.  Per neurology, she was inattentive and they were concerned for a metabolic cause.  When I evaluated her, she had dysarthria and was impossible to understand mostly.  She did not follow directions, but did answer some questions.  She would speak at length, but it was unclear what she was saying.  It appeared that she had expressive aphasia and also has some processing issues.  Unclear how acute this is and what her baseline is.    ED Course: She had initial labs in the ED which showed Glucose of 206 and some mild anemia which is chronic for her.  She had a CT of her head which showed only chronic changes.  Neurology evaluated her and recommended MRI/MRA of the brain  and other standard work up for TIA.   MRI of the head was completed and negative for acute findings.        SLP Plan  (defer to next venue of care to target memory in that setting)       Recommendations                   Follow up Recommendations: Skilled Nursing facility SLP Visit Diagnosis: Attention and concentration deficit Plan: (defer to next venue of care to target memory in that setting)       [place] 04/29/2018, 9:59 AM

## 2018-04-29 NOTE — Progress Notes (Signed)
BP @ 2130 = 151/83. MAP = 101. BP @ 2332 = 176/106, MAP = 127. No PRN BP med. Provider on call [facility] notiified

## 2018-04-29 NOTE — Progress Notes (Signed)
[name] refused lab to draw blood

## 2018-04-29 NOTE — Progress Notes (Signed)
Patient does not have legal guardian  

## 2018-04-29 NOTE — Care Management Important Message (Signed)
Important Message  Patient Details  Name: [name] MRN: [id number] Date of Birth: [birth year]   Medicare Important Message Given:  Yes    Abhijay Morriss 04/29/2018, 4:18 PM

## 2018-04-29 NOTE — Progress Notes (Signed)
Caregiver not in room

## 2018-04-30 DIAGNOSIS — L97822 Non-pressure chronic ulcer of other part of left lower leg with fat layer exposed: Secondary | ICD-10-CM | POA: Diagnosis not present

## 2018-04-30 DIAGNOSIS — Z8500 Personal history of malignant neoplasm of unspecified digestive organ: Secondary | ICD-10-CM | POA: Diagnosis not present

## 2018-04-30 DIAGNOSIS — L97812 Non-pressure chronic ulcer of other part of right lower leg with fat layer exposed: Secondary | ICD-10-CM | POA: Diagnosis not present

## 2018-04-30 DIAGNOSIS — I10 Essential (primary) hypertension: Secondary | ICD-10-CM | POA: Diagnosis not present

## 2018-04-30 DIAGNOSIS — E11622 Type 2 diabetes mellitus with other skin ulcer: Secondary | ICD-10-CM | POA: Diagnosis not present

## 2018-04-30 DIAGNOSIS — E1142 Type 2 diabetes mellitus with diabetic polyneuropathy: Secondary | ICD-10-CM | POA: Diagnosis not present

## 2018-04-30 DIAGNOSIS — L97522 Non-pressure chronic ulcer of other part of left foot with fat layer exposed: Secondary | ICD-10-CM | POA: Diagnosis not present

## 2018-04-30 DIAGNOSIS — E11621 Type 2 diabetes mellitus with foot ulcer: Secondary | ICD-10-CM | POA: Diagnosis not present

## 2018-04-30 DIAGNOSIS — Z7984 Long term (current) use of oral hypoglycemic drugs: Secondary | ICD-10-CM | POA: Diagnosis not present

## 2018-05-01 DIAGNOSIS — M069 Rheumatoid arthritis, unspecified: Secondary | ICD-10-CM | POA: Diagnosis not present

## 2018-05-01 DIAGNOSIS — D649 Anemia, unspecified: Secondary | ICD-10-CM | POA: Diagnosis not present

## 2018-05-01 DIAGNOSIS — K5790 Diverticulosis of intestine, part unspecified, without perforation or abscess without bleeding: Secondary | ICD-10-CM | POA: Diagnosis not present

## 2018-05-01 DIAGNOSIS — M160 Bilateral primary osteoarthritis of hip: Secondary | ICD-10-CM | POA: Diagnosis not present

## 2018-05-01 DIAGNOSIS — E11621 Type 2 diabetes mellitus with foot ulcer: Secondary | ICD-10-CM | POA: Diagnosis not present

## 2018-05-01 DIAGNOSIS — L97522 Non-pressure chronic ulcer of other part of left foot with fat layer exposed: Secondary | ICD-10-CM | POA: Diagnosis not present

## 2018-05-01 LAB — CULTURE, BLOOD (ROUTINE X 2)
CULTURE: NO GROWTH
Culture: NO GROWTH
SPECIAL REQUESTS: ADEQUATE
Special Requests: ADEQUATE

## 2018-05-01 LAB — LEGIONELLA PNEUMOPHILA SEROGP 1 UR AG: L. pneumophila Serogp 1 Ur Ag: NEGATIVE

## 2018-05-05 DIAGNOSIS — K5790 Diverticulosis of intestine, part unspecified, without perforation or abscess without bleeding: Secondary | ICD-10-CM | POA: Diagnosis not present

## 2018-05-05 DIAGNOSIS — E11621 Type 2 diabetes mellitus with foot ulcer: Secondary | ICD-10-CM | POA: Diagnosis not present

## 2018-05-05 DIAGNOSIS — L97522 Non-pressure chronic ulcer of other part of left foot with fat layer exposed: Secondary | ICD-10-CM | POA: Diagnosis not present

## 2018-05-05 DIAGNOSIS — M069 Rheumatoid arthritis, unspecified: Secondary | ICD-10-CM | POA: Diagnosis not present

## 2018-05-05 DIAGNOSIS — D649 Anemia, unspecified: Secondary | ICD-10-CM | POA: Diagnosis not present

## 2018-05-05 DIAGNOSIS — M160 Bilateral primary osteoarthritis of hip: Secondary | ICD-10-CM | POA: Diagnosis not present

## 2018-05-06 DIAGNOSIS — M0579 Rheumatoid arthritis with rheumatoid factor of multiple sites without organ or systems involvement: Secondary | ICD-10-CM | POA: Diagnosis not present

## 2018-05-06 DIAGNOSIS — E1149 Type 2 diabetes mellitus with other diabetic neurological complication: Secondary | ICD-10-CM | POA: Diagnosis not present

## 2018-05-06 DIAGNOSIS — R350 Frequency of micturition: Secondary | ICD-10-CM | POA: Diagnosis not present

## 2018-05-06 DIAGNOSIS — F339 Major depressive disorder, recurrent, unspecified: Secondary | ICD-10-CM | POA: Diagnosis not present

## 2018-05-06 DIAGNOSIS — L98491 Non-pressure chronic ulcer of skin of other sites limited to breakdown of skin: Secondary | ICD-10-CM | POA: Diagnosis not present

## 2018-05-06 DIAGNOSIS — R636 Underweight: Secondary | ICD-10-CM | POA: Diagnosis not present

## 2018-05-06 DIAGNOSIS — N390 Urinary tract infection, site not specified: Secondary | ICD-10-CM | POA: Diagnosis not present

## 2018-05-08 DIAGNOSIS — E1142 Type 2 diabetes mellitus with diabetic polyneuropathy: Secondary | ICD-10-CM | POA: Diagnosis not present

## 2018-05-08 DIAGNOSIS — L97522 Non-pressure chronic ulcer of other part of left foot with fat layer exposed: Secondary | ICD-10-CM | POA: Diagnosis not present

## 2018-05-08 DIAGNOSIS — L97822 Non-pressure chronic ulcer of other part of left lower leg with fat layer exposed: Secondary | ICD-10-CM | POA: Diagnosis not present

## 2018-05-08 DIAGNOSIS — E11621 Type 2 diabetes mellitus with foot ulcer: Secondary | ICD-10-CM | POA: Diagnosis not present

## 2018-05-08 DIAGNOSIS — L97812 Non-pressure chronic ulcer of other part of right lower leg with fat layer exposed: Secondary | ICD-10-CM | POA: Diagnosis not present

## 2018-05-08 DIAGNOSIS — E11622 Type 2 diabetes mellitus with other skin ulcer: Secondary | ICD-10-CM | POA: Diagnosis not present

## 2018-05-12 DIAGNOSIS — M069 Rheumatoid arthritis, unspecified: Secondary | ICD-10-CM | POA: Diagnosis not present

## 2018-05-12 DIAGNOSIS — L97522 Non-pressure chronic ulcer of other part of left foot with fat layer exposed: Secondary | ICD-10-CM | POA: Diagnosis not present

## 2018-05-12 DIAGNOSIS — K5790 Diverticulosis of intestine, part unspecified, without perforation or abscess without bleeding: Secondary | ICD-10-CM | POA: Diagnosis not present

## 2018-05-12 DIAGNOSIS — M160 Bilateral primary osteoarthritis of hip: Secondary | ICD-10-CM | POA: Diagnosis not present

## 2018-05-12 DIAGNOSIS — D649 Anemia, unspecified: Secondary | ICD-10-CM | POA: Diagnosis not present

## 2018-05-12 DIAGNOSIS — E11621 Type 2 diabetes mellitus with foot ulcer: Secondary | ICD-10-CM | POA: Diagnosis not present

## 2018-05-16 DIAGNOSIS — E11621 Type 2 diabetes mellitus with foot ulcer: Secondary | ICD-10-CM | POA: Diagnosis not present

## 2018-05-16 DIAGNOSIS — E1142 Type 2 diabetes mellitus with diabetic polyneuropathy: Secondary | ICD-10-CM | POA: Diagnosis not present

## 2018-05-16 DIAGNOSIS — L97522 Non-pressure chronic ulcer of other part of left foot with fat layer exposed: Secondary | ICD-10-CM | POA: Diagnosis not present

## 2018-05-16 DIAGNOSIS — L97822 Non-pressure chronic ulcer of other part of left lower leg with fat layer exposed: Secondary | ICD-10-CM | POA: Diagnosis not present

## 2018-05-16 DIAGNOSIS — L97812 Non-pressure chronic ulcer of other part of right lower leg with fat layer exposed: Secondary | ICD-10-CM | POA: Diagnosis not present

## 2018-05-16 DIAGNOSIS — E11622 Type 2 diabetes mellitus with other skin ulcer: Secondary | ICD-10-CM | POA: Diagnosis not present

## 2018-05-19 DIAGNOSIS — M069 Rheumatoid arthritis, unspecified: Secondary | ICD-10-CM | POA: Diagnosis not present

## 2018-05-19 DIAGNOSIS — E11621 Type 2 diabetes mellitus with foot ulcer: Secondary | ICD-10-CM | POA: Diagnosis not present

## 2018-05-19 DIAGNOSIS — K5790 Diverticulosis of intestine, part unspecified, without perforation or abscess without bleeding: Secondary | ICD-10-CM | POA: Diagnosis not present

## 2018-05-19 DIAGNOSIS — D649 Anemia, unspecified: Secondary | ICD-10-CM | POA: Diagnosis not present

## 2018-05-19 DIAGNOSIS — L97522 Non-pressure chronic ulcer of other part of left foot with fat layer exposed: Secondary | ICD-10-CM | POA: Diagnosis not present

## 2018-05-19 DIAGNOSIS — M160 Bilateral primary osteoarthritis of hip: Secondary | ICD-10-CM | POA: Diagnosis not present

## 2018-05-20 DIAGNOSIS — N390 Urinary tract infection, site not specified: Secondary | ICD-10-CM | POA: Diagnosis not present

## 2018-05-20 DIAGNOSIS — R350 Frequency of micturition: Secondary | ICD-10-CM | POA: Diagnosis not present

## 2018-05-20 DIAGNOSIS — E11621 Type 2 diabetes mellitus with foot ulcer: Secondary | ICD-10-CM | POA: Diagnosis not present

## 2018-05-20 DIAGNOSIS — R636 Underweight: Secondary | ICD-10-CM | POA: Diagnosis not present

## 2018-05-21 DIAGNOSIS — E11621 Type 2 diabetes mellitus with foot ulcer: Secondary | ICD-10-CM | POA: Diagnosis not present

## 2018-05-21 DIAGNOSIS — L97522 Non-pressure chronic ulcer of other part of left foot with fat layer exposed: Secondary | ICD-10-CM | POA: Diagnosis not present

## 2018-05-21 DIAGNOSIS — D649 Anemia, unspecified: Secondary | ICD-10-CM | POA: Diagnosis not present

## 2018-05-21 DIAGNOSIS — M069 Rheumatoid arthritis, unspecified: Secondary | ICD-10-CM | POA: Diagnosis not present

## 2018-05-21 DIAGNOSIS — M160 Bilateral primary osteoarthritis of hip: Secondary | ICD-10-CM | POA: Diagnosis not present

## 2018-05-21 DIAGNOSIS — K5790 Diverticulosis of intestine, part unspecified, without perforation or abscess without bleeding: Secondary | ICD-10-CM | POA: Diagnosis not present

## 2018-05-21 DIAGNOSIS — M6281 Muscle weakness (generalized): Secondary | ICD-10-CM | POA: Diagnosis not present

## 2018-05-23 DIAGNOSIS — L97522 Non-pressure chronic ulcer of other part of left foot with fat layer exposed: Secondary | ICD-10-CM | POA: Diagnosis not present

## 2018-05-23 DIAGNOSIS — E1142 Type 2 diabetes mellitus with diabetic polyneuropathy: Secondary | ICD-10-CM | POA: Diagnosis not present

## 2018-05-23 DIAGNOSIS — L97822 Non-pressure chronic ulcer of other part of left lower leg with fat layer exposed: Secondary | ICD-10-CM | POA: Diagnosis not present

## 2018-05-23 DIAGNOSIS — S81011A Laceration without foreign body, right knee, initial encounter: Secondary | ICD-10-CM | POA: Diagnosis not present

## 2018-05-23 DIAGNOSIS — S81801A Unspecified open wound, right lower leg, initial encounter: Secondary | ICD-10-CM | POA: Diagnosis not present

## 2018-05-23 DIAGNOSIS — L97812 Non-pressure chronic ulcer of other part of right lower leg with fat layer exposed: Secondary | ICD-10-CM | POA: Diagnosis not present

## 2018-05-23 DIAGNOSIS — E11621 Type 2 diabetes mellitus with foot ulcer: Secondary | ICD-10-CM | POA: Diagnosis not present

## 2018-05-23 DIAGNOSIS — E11622 Type 2 diabetes mellitus with other skin ulcer: Secondary | ICD-10-CM | POA: Diagnosis not present

## 2018-05-27 DIAGNOSIS — N390 Urinary tract infection, site not specified: Secondary | ICD-10-CM | POA: Diagnosis not present

## 2018-05-27 DIAGNOSIS — R350 Frequency of micturition: Secondary | ICD-10-CM | POA: Diagnosis not present

## 2018-05-28 DIAGNOSIS — D649 Anemia, unspecified: Secondary | ICD-10-CM | POA: Diagnosis not present

## 2018-05-28 DIAGNOSIS — M069 Rheumatoid arthritis, unspecified: Secondary | ICD-10-CM | POA: Diagnosis not present

## 2018-05-28 DIAGNOSIS — L97522 Non-pressure chronic ulcer of other part of left foot with fat layer exposed: Secondary | ICD-10-CM | POA: Diagnosis not present

## 2018-05-28 DIAGNOSIS — K5790 Diverticulosis of intestine, part unspecified, without perforation or abscess without bleeding: Secondary | ICD-10-CM | POA: Diagnosis not present

## 2018-05-28 DIAGNOSIS — M160 Bilateral primary osteoarthritis of hip: Secondary | ICD-10-CM | POA: Diagnosis not present

## 2018-05-28 DIAGNOSIS — M6281 Muscle weakness (generalized): Secondary | ICD-10-CM | POA: Diagnosis not present

## 2018-05-28 DIAGNOSIS — E11621 Type 2 diabetes mellitus with foot ulcer: Secondary | ICD-10-CM | POA: Diagnosis not present

## 2018-05-29 DIAGNOSIS — M160 Bilateral primary osteoarthritis of hip: Secondary | ICD-10-CM | POA: Diagnosis not present

## 2018-05-29 DIAGNOSIS — D649 Anemia, unspecified: Secondary | ICD-10-CM | POA: Diagnosis not present

## 2018-05-29 DIAGNOSIS — E11621 Type 2 diabetes mellitus with foot ulcer: Secondary | ICD-10-CM | POA: Diagnosis not present

## 2018-05-29 DIAGNOSIS — K5790 Diverticulosis of intestine, part unspecified, without perforation or abscess without bleeding: Secondary | ICD-10-CM | POA: Diagnosis not present

## 2018-05-29 DIAGNOSIS — M069 Rheumatoid arthritis, unspecified: Secondary | ICD-10-CM | POA: Diagnosis not present

## 2018-05-29 DIAGNOSIS — L97522 Non-pressure chronic ulcer of other part of left foot with fat layer exposed: Secondary | ICD-10-CM | POA: Diagnosis not present

## 2018-05-30 ENCOUNTER — Encounter (HOSPITAL_BASED_OUTPATIENT_CLINIC_OR_DEPARTMENT_OTHER): Payer: Medicare Other | Attending: Internal Medicine

## 2018-05-30 DIAGNOSIS — E11622 Type 2 diabetes mellitus with other skin ulcer: Secondary | ICD-10-CM | POA: Insufficient documentation

## 2018-05-30 DIAGNOSIS — E1142 Type 2 diabetes mellitus with diabetic polyneuropathy: Secondary | ICD-10-CM | POA: Diagnosis not present

## 2018-05-30 DIAGNOSIS — S81001A Unspecified open wound, right knee, initial encounter: Secondary | ICD-10-CM | POA: Insufficient documentation

## 2018-05-30 DIAGNOSIS — E11621 Type 2 diabetes mellitus with foot ulcer: Secondary | ICD-10-CM | POA: Diagnosis not present

## 2018-05-30 DIAGNOSIS — I10 Essential (primary) hypertension: Secondary | ICD-10-CM | POA: Insufficient documentation

## 2018-05-30 DIAGNOSIS — X58XXXA Exposure to other specified factors, initial encounter: Secondary | ICD-10-CM | POA: Diagnosis not present

## 2018-05-30 DIAGNOSIS — L97812 Non-pressure chronic ulcer of other part of right lower leg with fat layer exposed: Secondary | ICD-10-CM | POA: Diagnosis not present

## 2018-05-30 DIAGNOSIS — M069 Rheumatoid arthritis, unspecified: Secondary | ICD-10-CM | POA: Insufficient documentation

## 2018-05-30 DIAGNOSIS — L97522 Non-pressure chronic ulcer of other part of left foot with fat layer exposed: Secondary | ICD-10-CM | POA: Insufficient documentation

## 2018-06-03 ENCOUNTER — Encounter (HOSPITAL_COMMUNITY): Payer: Self-pay

## 2018-06-03 ENCOUNTER — Emergency Department (HOSPITAL_COMMUNITY): Payer: Medicare Other

## 2018-06-03 ENCOUNTER — Other Ambulatory Visit: Payer: Self-pay

## 2018-06-03 ENCOUNTER — Observation Stay (HOSPITAL_COMMUNITY)
Admission: EM | Admit: 2018-06-03 | Discharge: 2018-06-04 | Disposition: A | Discharge status: Home / Self Care | Payer: Medicare Other | Attending: Internal Medicine | Admitting: Internal Medicine

## 2018-06-03 ENCOUNTER — Inpatient Hospital Stay (HOSPITAL_COMMUNITY): Payer: Medicare Other

## 2018-06-03 DIAGNOSIS — R4701 Aphasia: Principal | ICD-10-CM | POA: Insufficient documentation

## 2018-06-03 DIAGNOSIS — Z66 Do not resuscitate: Secondary | ICD-10-CM | POA: Insufficient documentation

## 2018-06-03 DIAGNOSIS — E785 Hyperlipidemia, unspecified: Secondary | ICD-10-CM | POA: Insufficient documentation

## 2018-06-03 DIAGNOSIS — Z7984 Long term (current) use of oral hypoglycemic drugs: Secondary | ICD-10-CM | POA: Diagnosis not present

## 2018-06-03 DIAGNOSIS — R4182 Altered mental status, unspecified: Secondary | ICD-10-CM | POA: Diagnosis present

## 2018-06-03 DIAGNOSIS — I10 Essential (primary) hypertension: Secondary | ICD-10-CM | POA: Diagnosis not present

## 2018-06-03 DIAGNOSIS — I482 Chronic atrial fibrillation, unspecified: Secondary | ICD-10-CM | POA: Diagnosis present

## 2018-06-03 DIAGNOSIS — R4781 Slurred speech: Secondary | ICD-10-CM | POA: Diagnosis not present

## 2018-06-03 DIAGNOSIS — Z87440 Personal history of urinary (tract) infections: Secondary | ICD-10-CM | POA: Diagnosis not present

## 2018-06-03 DIAGNOSIS — M069 Rheumatoid arthritis, unspecified: Secondary | ICD-10-CM | POA: Insufficient documentation

## 2018-06-03 DIAGNOSIS — F0390 Unspecified dementia without behavioral disturbance: Secondary | ICD-10-CM | POA: Diagnosis not present

## 2018-06-03 DIAGNOSIS — F329 Major depressive disorder, single episode, unspecified: Secondary | ICD-10-CM | POA: Diagnosis not present

## 2018-06-03 DIAGNOSIS — E039 Hypothyroidism, unspecified: Secondary | ICD-10-CM

## 2018-06-03 DIAGNOSIS — R531 Weakness: Secondary | ICD-10-CM | POA: Diagnosis not present

## 2018-06-03 DIAGNOSIS — R9401 Abnormal electroencephalogram [EEG]: Secondary | ICD-10-CM | POA: Diagnosis not present

## 2018-06-03 DIAGNOSIS — G9340 Encephalopathy, unspecified: Secondary | ICD-10-CM | POA: Diagnosis not present

## 2018-06-03 DIAGNOSIS — E1165 Type 2 diabetes mellitus with hyperglycemia: Secondary | ICD-10-CM | POA: Diagnosis not present

## 2018-06-03 DIAGNOSIS — G8191 Hemiplegia, unspecified affecting right dominant side: Secondary | ICD-10-CM | POA: Diagnosis not present

## 2018-06-03 DIAGNOSIS — I700 Atherosclerosis of aorta: Secondary | ICD-10-CM | POA: Insufficient documentation

## 2018-06-03 DIAGNOSIS — K219 Gastro-esophageal reflux disease without esophagitis: Secondary | ICD-10-CM | POA: Diagnosis not present

## 2018-06-03 DIAGNOSIS — E1169 Type 2 diabetes mellitus with other specified complication: Secondary | ICD-10-CM | POA: Diagnosis present

## 2018-06-03 DIAGNOSIS — N3000 Acute cystitis without hematuria: Secondary | ICD-10-CM

## 2018-06-03 DIAGNOSIS — Z79899 Other long term (current) drug therapy: Secondary | ICD-10-CM | POA: Insufficient documentation

## 2018-06-03 DIAGNOSIS — G3189 Other specified degenerative diseases of nervous system: Secondary | ICD-10-CM | POA: Insufficient documentation

## 2018-06-03 DIAGNOSIS — E1151 Type 2 diabetes mellitus with diabetic peripheral angiopathy without gangrene: Secondary | ICD-10-CM | POA: Diagnosis not present

## 2018-06-03 LAB — DIFFERENTIAL
ABS IMMATURE GRANULOCYTES: 0.1 10*3/uL (ref 0.0–0.1)
Basophils Absolute: 0 10*3/uL (ref 0.0–0.1)
Basophils Relative: 1 %
Eosinophils Absolute: 0.2 10*3/uL (ref 0.0–0.7)
Eosinophils Relative: 2 %
Immature Granulocytes: 1 %
LYMPHS ABS: 1.3 10*3/uL (ref 0.7–4.0)
LYMPHS PCT: 16 %
MONO ABS: 0.7 10*3/uL (ref 0.1–1.0)
MONOS PCT: 9 %
NEUTROS ABS: 5.8 10*3/uL (ref 1.7–7.7)
Neutrophils Relative %: 71 %

## 2018-06-03 LAB — LIPASE, BLOOD: Lipase: 42 U/L (ref 11–51)

## 2018-06-03 LAB — CBC
HCT: 39.4 % (ref 36.0–46.0)
HEMOGLOBIN: 11.6 g/dL — AB (ref 12.0–15.0)
MCH: 26.1 pg (ref 26.0–34.0)
MCHC: 29.4 g/dL — AB (ref 30.0–36.0)
MCV: 88.7 fL (ref 78.0–100.0)
Platelets: 263 10*3/uL (ref 150–400)
RBC: 4.44 MIL/uL (ref 3.87–5.11)
RDW: 15.8 % — ABNORMAL HIGH (ref 11.5–15.5)
WBC: 8.2 10*3/uL (ref 4.0–10.5)

## 2018-06-03 LAB — URINALYSIS, ROUTINE W REFLEX MICROSCOPIC
BILIRUBIN URINE: NEGATIVE
Glucose, UA: NEGATIVE mg/dL
Hgb urine dipstick: NEGATIVE
Ketones, ur: NEGATIVE mg/dL
NITRITE: NEGATIVE
Protein, ur: NEGATIVE mg/dL
SPECIFIC GRAVITY, URINE: 1.017 (ref 1.005–1.030)
WBC, UA: >50 WBC/hpf — ABNORMAL HIGH (ref 0–5)
pH: 6 (ref 5.0–8.0)

## 2018-06-03 LAB — TSH: TSH: 4.506 u[IU]/mL — ABNORMAL HIGH (ref 0.350–4.500)

## 2018-06-03 LAB — PROTIME-INR
INR: 1.05
Prothrombin Time: 13.6 seconds (ref 11.4–15.2)

## 2018-06-03 LAB — CBG MONITORING, ED
Glucose-Capillary: 184 mg/dL — ABNORMAL HIGH (ref 70–99)
Glucose-Capillary: 204 mg/dL — ABNORMAL HIGH (ref 70–99)

## 2018-06-03 LAB — COMPREHENSIVE METABOLIC PANEL
ALK PHOS: 82 U/L (ref 38–126)
ALT: 14 U/L (ref 0–44)
AST: 20 U/L (ref 15–41)
Albumin: 3.1 g/dL — ABNORMAL LOW (ref 3.5–5.0)
Anion gap: 11 (ref 5–15)
BILIRUBIN TOTAL: 0.3 mg/dL (ref 0.3–1.2)
BUN: 13 mg/dL (ref 8–23)
CHLORIDE: 102 mmol/L (ref 98–111)
CO2: 25 mmol/L (ref 22–32)
Calcium: 9 mg/dL (ref 8.9–10.3)
Creatinine, Ser: 0.68 mg/dL (ref 0.44–1.00)
GLUCOSE: 203 mg/dL — AB (ref 70–99)
Potassium: 4.4 mmol/L (ref 3.5–5.1)
Sodium: 138 mmol/L (ref 135–145)
Total Protein: 6.1 g/dL — ABNORMAL LOW (ref 6.5–8.1)

## 2018-06-03 LAB — AMMONIA: Ammonia: 29 umol/L (ref 9–35)

## 2018-06-03 LAB — I-STAT TROPONIN, ED: TROPONIN I, POC: 0.04 ng/mL (ref 0.00–0.08)

## 2018-06-03 LAB — APTT: aPTT: 30 seconds (ref 24–36)

## 2018-06-03 LAB — PROCALCITONIN: Procalcitonin: <0.1 ng/mL

## 2018-06-03 LAB — GLUCOSE, CAPILLARY: GLUCOSE-CAPILLARY: 313 mg/dL — AB (ref 70–99)

## 2018-06-03 LAB — I-STAT CG4 LACTIC ACID, ED
LACTIC ACID, VENOUS: 2 mmol/L — AB (ref 0.5–1.9)
Lactic Acid, Venous: 1.85 mmol/L (ref 0.5–1.9)

## 2018-06-03 LAB — MAGNESIUM: MAGNESIUM: 1.6 mg/dL — AB (ref 1.7–2.4)

## 2018-06-03 LAB — LACTIC ACID, PLASMA: Lactic Acid, Venous: 2.7 mmol/L (ref 0.5–1.9)

## 2018-06-03 MED ORDER — HYDROCORTISONE NA SUCCINATE PF 100 MG IJ SOLR
50.0000 mg | Freq: Four times a day (QID) | INTRAMUSCULAR | Status: DC
  Administered 2018-06-03 – 2018-06-04 (×4): 50 mg via INTRAVENOUS
  Filled 2018-06-03 (×4): qty 2

## 2018-06-03 MED ORDER — SENNOSIDES-DOCUSATE SODIUM 8.6-50 MG PO TABS: 1.0000 | ORAL_TABLET | Freq: Every evening | ORAL | Status: DC | PRN

## 2018-06-03 MED ORDER — ACETAMINOPHEN 325 MG PO TABS: 650.0000 mg | ORAL_TABLET | ORAL | Status: DC | PRN

## 2018-06-03 MED ORDER — ENOXAPARIN SODIUM 40 MG/0.4ML ~~LOC~~ SOLN: 40.0000 mg | SUBCUTANEOUS | Status: DC

## 2018-06-03 MED ORDER — SODIUM CHLORIDE 0.9 % IV SOLN
1.0000 g | Freq: Once | INTRAVENOUS | Status: AC
  Administered 2018-06-03: 1 g via INTRAVENOUS
  Filled 2018-06-03: qty 10

## 2018-06-03 MED ORDER — SODIUM CHLORIDE 0.9 % IV SOLN
INTRAVENOUS | Status: DC
  Administered 2018-06-03 – 2018-06-04 (×2): via INTRAVENOUS

## 2018-06-03 MED ORDER — ACETAMINOPHEN 160 MG/5ML PO SOLN: 650.0000 mg | ORAL | Status: DC | PRN

## 2018-06-03 MED ORDER — SODIUM CHLORIDE 0.9 % IV BOLUS
500.0000 mL | Freq: Once | INTRAVENOUS | Status: AC
  Administered 2018-06-03: 500 mL via INTRAVENOUS

## 2018-06-03 MED ORDER — SODIUM CHLORIDE 0.9 % IV BOLUS
1000.0000 mL | Freq: Once | INTRAVENOUS | Status: AC
  Administered 2018-06-03: 1000 mL via INTRAVENOUS

## 2018-06-03 MED ORDER — LORAZEPAM 2 MG/ML IJ SOLN
0.5000 mg | Freq: Once | INTRAMUSCULAR | Status: AC
  Administered 2018-06-03: 0.5 mg via INTRAVENOUS
  Filled 2018-06-03: qty 1

## 2018-06-03 MED ORDER — ACETAMINOPHEN 650 MG RE SUPP: 650.0000 mg | RECTAL | Status: DC | PRN

## 2018-06-03 MED ORDER — INSULIN ASPART 100 UNIT/ML ~~LOC~~ SOLN
0.0000 [IU] | Freq: Three times a day (TID) | SUBCUTANEOUS | Status: DC
  Administered 2018-06-04: 3 [IU] via SUBCUTANEOUS

## 2018-06-03 MED ORDER — STROKE: EARLY STAGES OF RECOVERY BOOK
Freq: Once | Status: DC
  Filled 2018-06-03: qty 1

## 2018-06-03 NOTE — ED Notes (Signed)
Pt transported to MRI with this RN. 

## 2018-06-03 NOTE — ED Provider Notes (Signed)
[name] Provider Note   CSN: [id number] Arrival date & time: 06/03/18  1141     History   Chief Complaint Chief Complaint  Patient presents with  . Altered Mental Status  . Aphasia    HPI [name] is a 84 y.o. female.  The history is provided by the patient and medical records. No language interpreter was used.  Neurologic Problem  This is a recurrent problem. The current episode started 6 to 12 hours ago. The problem occurs constantly. The problem has not changed since onset.Pertinent negatives include no chest pain, no abdominal pain, no headaches and no shortness of breath. Nothing aggravates the symptoms. Nothing relieves the symptoms. She has tried nothing for the symptoms. The treatment provided no relief.   LVL 5 caveat for inability to speak and dementia   Past Medical History:  Diagnosis Date  . Acute bronchitis   . Anemia, unspecified   . Atrial fibrillation ([place])   . Depressive disorder, not elsewhere classified   . Diverticulosis of colon (without mention of hemorrhage)   . Esophageal reflux   . Family history of malignant neoplasm of gastrointestinal tract   . Functional diarrhea   . Hiatal hernia   . History of blood transfusion    "once; related to diverticulitis" (11/29/2017)  . Other and unspecified hyperlipidemia   . Other chest pain   . Other specified cardiac dysrhythmias(427.89)   . Personal history of colonic polyps 07/17/1995   adenomatous polyps  . PONV (postoperative nausea and vomiting)   . Recurrent UTI (urinary tract infection)    "2-3 times in the last 1 1/50yr (11/29/2017)  . Rheumatoid arthritis ([place]   . Tachycardia, unspecified   . Type II diabetes mellitus ([place]   . Unspecified adverse effect of unspecified drug, medicinal and biological substance   . Unspecified essential hypertension   . Unspecified hypothyroidism     Patient Active Problem List   Diagnosis Date Noted  .  Pneumonia 04/26/2018  . TIA (transient ischemic attack) 04/25/2018  . Aphasia   . Pressure injury of skin 02/26/2018  . Hyperkalemia 02/25/2018  . Altered mental status 02/25/2018  . Acute lower UTI 02/25/2018  . Iron deficiency anemia due to chronic blood loss 12/17/2017  . Protein-calorie malnutrition, severe 11/30/2017  . Syncope 11/29/2017  . Diabetic ulcer of left foot associated with type 2 diabetes mellitus ([place] 08/11/2015  . Closed right hip fracture ([place] 08/28/2012  . History of gastroesophageal reflux (GERD) 06/26/2011  . Lower GI bleeding 06/26/2011  . DEPRESSION 03/31/2009  . DIARRHEA, FUNCTIONAL 03/31/2009  . DIABETIC HYPOGLYCEMIA, TYPE II 11/03/2008  . ACUTE BRONCHITIS 08/23/2008  . AV NODAL REENTRY TACHYCARDIA 10/29/2007  . TACHYCARDIA 10/29/2007  . CHEST PAIN, ATYPICAL 10/25/2007  . ADVEF, DRUG/MEDICINAL/BIOLOGICAL SUBST NOS 07/16/2007  . Nonspecific (abnormal) findings on radiological and other examination of body structure 04/03/2007  . CHEST XRAY, ABNORMAL 04/03/2007  . Hypothyroidism 03/06/2007  . Type 2 diabetes mellitus with other specified complication ([place] [id number]/1157 . HYPERLIPIDEMIA 03/06/2007  . Essential hypertension 03/06/2007  . DIVERTICULOSIS, COLON 03/06/2007  . Rheumatoid arthritis involving both hands with positive rheumatoid factor ([place] 03/06/2007    Past Surgical History:  Procedure Laterality Date  . ABDOMINAL HYSTERECTOMY     "partial"  . CATARACT EXTRACTION Bilateral   . FOREARM FRACTURE SURGERY Right 1940s?  . FRACTURE SURGERY    . ORIF FEMUR FRACTURE  08/30/2012   Procedure: OPEN REDUCTION INTERNAL FIXATION (ORIF) DISTAL FEMUR  FRACTURE;  Surgeon: Mauri Pole, MD;  Location: [facility];  Service: Orthopedics;  Laterality: Right;  proximal femur  . THYROIDECTOMY, PARTIAL       OB History   None      Home Medications    Prior to Admission medications   Medication Sig Start Date End Date Taking? Authorizing Provider  feeding  supplement, ENSURE ENLIVE, (ENSURE ENLIVE) LIQD Take 237 mLs by mouth 2 (two) times daily between meals. 02/26/18   Lavina Hamman, MD  ferrous sulfate 220 (44 Fe) MG/5ML solution Take 660 mg by mouth daily.    [provider]  leflunomide (ARAVA) 20 MG tablet Take 20 mg by mouth daily. 08/14/17   [provider]  levothyroxine (SYNTHROID) 100 MCG tablet Take 1 tablet (100 mcg total) by mouth daily. 12/07/17 12/07/18  Regalado, Belkys A, MD  magnesium oxide (MAG-OX) 400 (241.3 Mg) MG tablet Take 0.5 tablets (200 mg total) by mouth 2 (two) times daily. 12/07/17   Regalado, Belkys A, MD  Melatonin 2.5 MG CAPS Take 1 capsule (2.5 mg total) by mouth at bedtime as needed (for sleep). Patient taking differently: Take 2.5 mg by mouth at bedtime.  12/07/17   Regalado, Belkys A, MD  metFORMIN (GLUCOPHAGE-XR) 500 MG 24 hr tablet Take 500-1,000 mg by mouth 2 (two) times daily. Take 1000 mg by mouth in the morning and 500 mg by mouth in the evening 03/07/15   [provider]  omeprazole (PRILOSEC) 40 MG capsule Take 1 capsule (40 mg total) by mouth 2 (two) times daily. 12/07/17   Regalado, Belkys A, MD  predniSONE (DELTASONE) 1 MG tablet Take 4 mg by mouth daily with breakfast.     [provider]  TRADJENTA 5 MG TABS tablet Take 5 mg by mouth daily. 01/29/18   [provider]  VOLTAREN 1 % GEL Apply 2 g topically 4 (four) times daily.  11/30/13   [provider]    Family History Family History  Problem Relation Age of Onset  . Colon cancer Maternal Grandmother     Social History Social History   Tobacco Use  . Smoking status: Never Smoker  . Smokeless tobacco: Never Used  Substance Use Topics  . Alcohol use: Yes    Alcohol/week: 1.0 standard drinks    Types: 1 Glasses of wine per week  . Drug use: No     Allergies   Patient has no known allergies.   Review of Systems Review of Systems  Unable to perform ROS: Mental status change    Constitutional: Negative for chills and fever.  Respiratory: Negative for shortness of breath.   Cardiovascular: Negative for chest pain.  Gastrointestinal: Negative for abdominal pain.  Neurological: Positive for speech difficulty. Negative for headaches.     Physical Exam Updated Vital Signs BP (!) 164/87 (BP Location: Right Arm)   Pulse 85   Temp 97.8 F (36.6 C) (Oral)   Resp 16   Ht '5\' 5"'  (1.651 m)   Wt 44.1 kg   SpO2 100%   BMI 16.18 kg/m   Physical Exam  Constitutional: She appears well-developed and well-nourished. No distress.  HENT:  Head: Normocephalic and atraumatic.  Nose: Nose normal.  Mouth/Throat: Oropharynx is clear and moist. No oropharyngeal exudate.  Eyes: Pupils are equal, round, and reactive to light. Conjunctivae and EOM are normal.  Neck: Normal range of motion. Neck supple.  Cardiovascular: Normal rate and regular rhythm.  No murmur heard. Pulmonary/Chest: Effort normal and  breath sounds normal. No respiratory distress. She has no wheezes. She has no rales. She exhibits no tenderness.  Abdominal: Soft. Bowel sounds are normal. She exhibits no distension. There is no tenderness.  Musculoskeletal: She exhibits no edema or tenderness.  Neurological: She is alert. She displays no tremor. No cranial nerve deficit. She exhibits normal muscle tone. She displays no seizure activity. GCS eye subscore is 4. GCS verbal subscore is 2. GCS motor subscore is 5.  Patient had a aphasia on exam.  Patient unable to answer any questions appropriately.  Skin: Skin is warm and dry. Capillary refill takes less than 2 seconds. No rash noted. She is not diaphoretic. No erythema.  Psychiatric: She has a normal mood and affect.  Nursing note and vitals reviewed.    ED Treatments / Results  Labs (all labs ordered are listed, but only abnormal results are displayed) Labs Reviewed  CBC - Abnormal; Notable for the following components:      Result Value   Hemoglobin 11.6  (*)    MCHC 29.4 (*)    RDW 15.8 (*)    All other components within normal limits  COMPREHENSIVE METABOLIC PANEL - Abnormal; Notable for the following components:   Glucose, Bld 203 (*)    Total Protein 6.1 (*)    Albumin 3.1 (*)    All other components within normal limits  TSH - Abnormal; Notable for the following components:   TSH 4.506 (*)    All other components within normal limits  URINALYSIS, ROUTINE W REFLEX MICROSCOPIC - Abnormal; Notable for the following components:   Leukocytes, UA SMALL (*)    WBC, UA >50 (*)    Bacteria, UA RARE (*)    All other components within normal limits  MAGNESIUM - Abnormal; Notable for the following components:   Magnesium 1.6 (*)    All other components within normal limits  LACTIC ACID, PLASMA - Abnormal; Notable for the following components:   Lactic Acid, Venous 2.7 (*)    All other components within normal limits  CBG MONITORING, ED - Abnormal; Notable for the following components:   Glucose-Capillary 184 (*)    All other components within normal limits  I-STAT CG4 LACTIC ACID, ED - Abnormal; Notable for the following components:   Lactic Acid, Venous 2.00 (*)    All other components within normal limits  CBG MONITORING, ED - Abnormal; Notable for the following components:   Glucose-Capillary 204 (*)    All other components within normal limits  URINE CULTURE  CULTURE, BLOOD (ROUTINE X 2)  CULTURE, BLOOD (ROUTINE X 2)  PROTIME-INR  APTT  DIFFERENTIAL  LIPASE, BLOOD  AMMONIA  PROCALCITONIN  LIPID PANEL  LACTIC ACID, PLASMA  I-STAT TROPONIN, ED  I-STAT CG4 LACTIC ACID, ED    EKG EKG Interpretation  Date/Time:  Tuesday June 03 2018 13:51:09 EDT Ventricular Rate:  85 PR Interval:    QRS Duration: 78 QT Interval:  370 QTC Calculation: 440 R Axis:   97 Text Interpretation:  Sinus rhythm Consider left atrial enlargement Right axis deviation Consider left ventricular hypertrophy Repol abnrm suggests ischemia, lateral  leads When comapred to prior, no significant changes.  No STEMI Confirmed by Antony Blackbird [phone]) on 06/03/2018 2:07:56 PM   Radiology Dg Chest 1 View  Result Date: 06/03/2018 CLINICAL DATA:  Altered mental status. History of bronchitis, atrial fibrillation, diabetes. EXAM: CHEST  1 VIEW COMPARISON:  PA and lateral chest x-ray dated April 26, 2018 FINDINGS: The lungs are well-expanded.  There is no focal infiltrate. The interstitial markings are coarse but have improved since the previous study. The heart is top-normal in size. The pulmonary vascularity is not engorged. There is calcification in the wall of the aortic arch. The observed bony thorax is unremarkable. IMPRESSION: Chronic bronchitic changes which have improved somewhat since the previous study. Stable mild cardiomegaly without pulmonary vascular congestion or pulmonary edema. Thoracic aortic atherosclerosis. Electronically Signed   By: David  Martinique M.D.   On: 06/03/2018 13:18   Ct Head Wo Contrast  Result Date: 06/03/2018 CLINICAL DATA:  Altered mental status with slurred speech EXAM: CT HEAD WITHOUT CONTRAST TECHNIQUE: Contiguous axial images were obtained from the base of the skull through the vertex without intravenous contrast. COMPARISON:  Head CT and brain MRI April 25, 2018 FINDINGS: Brain: Moderate diffuse atrophy is stable. There is no intracranial mass, hemorrhage, extra-axial fluid collection, or midline shift. There is patchy small vessel disease throughout the centra semiovale bilaterally. There is small vessel disease in each external capsule anteriorly. There is small vessel disease in each thalamus. No acute infarct is demonstrable on this study. Vascular: There is no hyperdense vessel. There is calcification in each distal vertebral artery and carotid siphon region. Skull: Bones are somewhat osteoporotic. The bony calvarium appears intact. Sinuses/Orbits: There is mucosal thickening and opacification in several ethmoid air  cells. There is a small retention cyst in the left sphenoid sinus. Other visualized paranasal sinuses are clear. Orbits appear symmetric bilaterally with previous cataract extractions. Other: Mastoid air cells are clear. IMPRESSION: Stable atrophy with supratentorial small vessel disease. No acute infarct evident. No mass or hemorrhage. There are foci of arterial vascular calcification. There are areas of paranasal sinus disease. Electronically Signed   By: Lowella Grip III M.D.   On: 06/03/2018 13:30   Mr Brain Wo Contrast  Result Date: 06/03/2018 CLINICAL DATA:  New onset aphasia. Altered mental status. Atrial fibrillation not on anticoagulation. Hypertension dementia diabetes EXAM: MRI HEAD WITHOUT CONTRAST TECHNIQUE: Multiplanar, multiecho pulse sequences of the brain and surrounding structures were obtained without intravenous contrast. COMPARISON:  CT head 06/03/2018.  MRI head 04/25/2018 FINDINGS: Brain: Image quality degraded by motion. Moderate cortical atrophy.  Negative for hydrocephalus. Negative for acute infarct. Moderate chronic microvascular ischemic change in the white matter and pons. Negative for hemorrhage or mass lesion. Vascular: Normal arterial flow voids Skull and upper cervical spine: Negative Sinuses/Orbits: Paranasal sinuses clear. Bilateral cataract surgery. Other: None IMPRESSION: Negative for acute infarct. Moderate atrophy and chronic microvascular ischemia Image quality degraded by motion Electronically Signed   By: Franchot Gallo M.D.   On: 06/03/2018 15:39    Procedures Procedures (including critical care time)  Medications Ordered in ED Medications  enoxaparin (LOVENOX) injection 40 mg (40 mg Subcutaneous Not Given 06/03/18 1701)   stroke: mapping our early stages of recovery book ( Does not apply Not Given 06/03/18 1701)  0.9 %  sodium chloride infusion ( Intravenous New Bag/Given 06/03/18 1708)  acetaminophen (TYLENOL) tablet 650 mg (has no administration in time  range)    Or  acetaminophen (TYLENOL) solution 650 mg (has no administration in time range)    Or  acetaminophen (TYLENOL) suppository 650 mg (has no administration in time range)  senna-docusate (Senokot-S) tablet 1 tablet (has no administration in time range)  hydrocortisone sodium succinate (SOLU-CORTEF) 100 MG injection 50 mg (50 mg Intravenous Given 06/03/18 1708)  insulin aspart (novoLOG) injection 0-9 Units (0 Units Subcutaneous Not Given 06/03/18 1714)  sodium chloride  0.9 % bolus 500 mL (has no administration in time range)  sodium chloride 0.9 % bolus 1,000 mL (0 mLs Intravenous Stopped 06/03/18 1507)  cefTRIAXone (ROCEPHIN) 1 g in sodium chloride 0.9 % 100 mL IVPB (0 g Intravenous Stopped 06/03/18 1643)  LORazepam (ATIVAN) injection 0.5 mg (0.5 mg Intravenous Given 06/03/18 1505)     Initial Impression / Assessment and Plan / ED Course  I have reviewed the triage vital signs and the nursing notes.  Pertinent labs & imaging results that were available during my care of the patient were reviewed by me and considered in my medical decision making (see chart for details).     [name] is a 84 y.o. female with a past medical history significant for hypothyroidism, hypertension, atrial fibrillation not on anticoagulation, rheumatoid arthritis, prior TIA, diabetes, mild dementia and recent admission for pneumonia last month and recent completion yesterday of antibiotics for UTI who presents with altered mental status and aphasia.  According to caregiver, patient was recently admitted 1 month ago with similar symptoms.  Patient was made a code stroke when she had slurred speech/aphasia 1 month ago.  Patient did not end up having a stroke but was encephalopathic due to underlying pneumonia.  Caregiver reports the patient has not had more coughing but recently completed antibiotics yesterday for UTI.  Caregiver says that this morning, patient was speaking normally but then throughout the  morning had worsening speech patterns and confusion.  Patient was quickly brought in for evaluation.  Caregiver says that last normal was at 10 AM when she was going for breakfast.  Speech has worsened since then.  Patient is unable to answer any questions with her a aphasia/dysarthria.  Caregiver says that she does not have her bottom teeth but did not have them for the last week and was able to speak relatively clearly.  On exam, patient moving all extremities.  No unilateral weakness on my exam.  No facial droop.  Pupils are reactive bilaterally with normal extraocular movements.  Patient reacts to touch in all extremities.  No focal tenderness present in the abdomen or chest.  Lungs clear.  Clinically I suspect patient has recurrent encephalopathy due to a metabolic etiology.  As she just stopped antibiotics for the UTI yesterday I am concerned about recurrent UTI worsening.  Patient had a head CT and will have work-up to look for other normalities.  Next  Urinalysis shows evidence of urinary tract infection with leukocytes and worsen bacteria.  Patient has slightly decreased magnesium.  Ammonia not elevated.  Lactic acid not elevated.  TSH slightly elevated.  Lipase not elevated.  Met about panel showed reassuring electrolytes, kidney function, and liver function.  INR normal.  No leukocytosis and mild anemia.  No infarct seen on head CT.  Chest x-ray shows improving opacities.  Next  Clinical I suspect patient is encephalopathic due to worsening UTI.  Next  Patient was given Rocephin and will be admitted.  Will defer to admitting team for repeat MRI if they feel it is needed if patient does not improve.  2:40 PM Spoke with caregiver again who thought the speech changed acutely at 10 AM.  Spoke with neurology who agreed with calling a code stroke.  This patient's aunt had CT imaging, they recommended getting an MRI.  Telemetry on my reexamination appears to show A. fib, repeat EKG will be  performed.  Patient will be admitted to medicine service but will also have the neurologic work-up including  MRI.   Final Clinical Impressions(s) / ED Diagnoses   Final diagnoses:  Aphasia  Acute cystitis without hematuria     Clinical Impression: 1. Aphasia   2. Altered mental status   3. Acute cystitis without hematuria     Disposition: Admit  This note was prepared with assistance of Dragon voice recognition software. Occasional wrong-word or sound-a-like substitutions may have occurred due to the inherent limitations of voice recognition software.     Javaris Wigington, Gwenyth Allegra, MD 06/03/18 [phone]

## 2018-06-03 NOTE — ED Notes (Signed)
CBG 204. 

## 2018-06-03 NOTE — ED Notes (Signed)
Helped get patient cleaned up patient is resting with call bell in reach

## 2018-06-03 NOTE — Consult Note (Addendum)
Neurology Consultation  Reason for Consult: Acute code stroke Referring Physician: Dr. Sherry Ruffing  CC: Aphasia, confusion  History is obtained from: Chart, patient caregiver at bedside  HPI: [name] is a 84 y.o. female past medical history of atrial fibrillation currently not on anticoagulation, hypertension, dementia, diabetes, with last known normal at either 3 PM yesterday or 11 PM yesterday, brought in today for evaluation of confusion. Her caregiver who arrived for this morning shift reported that she was not sounding right.  She could not use her words or form words. She was also not able to use her right arm consistently. She had similar presentation about a month ago and was found to have pneumonia and UTI, for which she has finished a course of antibiotics yesterday. No preceding illnesses or sicknesses. Unclear why patient is not on anticoagulation at this time based on brief chart review.  LKW: 11pm 06/02/2018 tpa given?: no, outside the window Premorbid modified Rankin scale (mRS): 4  ROS:  ROS was performed and is negative except as noted in the HPI.   Past Medical History:  Diagnosis Date  . Acute bronchitis   . Anemia, unspecified   . Atrial fibrillation ([place])   . Depressive disorder, not elsewhere classified   . Diverticulosis of colon (without mention of hemorrhage)   . Esophageal reflux   . Family history of malignant neoplasm of gastrointestinal tract   . Functional diarrhea   . Hiatal hernia   . History of blood transfusion    "once; related to diverticulitis" (11/29/2017)  . Other and unspecified hyperlipidemia   . Other chest pain   . Other specified cardiac dysrhythmias(427.89)   . Personal history of colonic polyps 07/17/1995   adenomatous polyps  . PONV (postoperative nausea and vomiting)   . Recurrent UTI (urinary tract infection)    "2-3 times in the last 1 1/74yr" (11/29/2017)  . Rheumatoid arthritis ([place])   . Tachycardia, unspecified   .  Type II diabetes mellitus ([place])   . Unspecified adverse effect of unspecified drug, medicinal and biological substance   . Unspecified essential hypertension   . Unspecified hypothyroidism    Family History  Problem Relation Age of Onset  . Colon cancer Maternal Grandmother    Social History:   reports that she has never smoked. She has never used smokeless tobacco. She reports that she drinks about 1.0 standard drinks of alcohol per week. She reports that she does not use drugs.  Medications  Current Facility-Administered Medications:  .  cefTRIAXone (ROCEPHIN) 1 g in sodium chloride 0.9 % 100 mL IVPB, 1 g, Intravenous, Once, Tegeler, Gwenyth Allegra, MD  Current Outpatient Medications:  .  feeding supplement, ENSURE ENLIVE, (ENSURE ENLIVE) LIQD, Take 237 mLs by mouth 2 (two) times daily between meals., Disp: 60 Bottle, Rfl: 0 .  ferrous sulfate 220 (44 Fe) MG/5ML solution, Take 660 mg by mouth daily., Disp: , Rfl:  .  leflunomide (ARAVA) 20 MG tablet, Take 20 mg by mouth daily., Disp: , Rfl:  .  levothyroxine (SYNTHROID) 100 MCG tablet, Take 1 tablet (100 mcg total) by mouth daily., Disp: 30 tablet, Rfl: 11 .  magnesium oxide (MAG-OX) 400 (241.3 Mg) MG tablet, Take 0.5 tablets (200 mg total) by mouth 2 (two) times daily., Disp: 10 tablet, Rfl: 0 .  Melatonin 2.5 MG CAPS, Take 1 capsule (2.5 mg total) by mouth at bedtime as needed (for sleep). (Patient taking differently: Take 2.5 mg by mouth at bedtime. ), Disp: 30 capsule,  Rfl: 0 .  metFORMIN (GLUCOPHAGE-XR) 500 MG 24 hr tablet, Take 500-1,000 mg by mouth 2 (two) times daily. Take 1000 mg by mouth in the morning and 500 mg by mouth in the evening, Disp: , Rfl:  .  omeprazole (PRILOSEC) 40 MG capsule, Take 1 capsule (40 mg total) by mouth 2 (two) times daily., Disp: 60 capsule, Rfl: 0 .  predniSONE (DELTASONE) 1 MG tablet, Take 4 mg by mouth daily with breakfast. , Disp: , Rfl:  .  TRADJENTA 5 MG TABS tablet, Take 5 mg by mouth daily.,  Disp: , Rfl:  .  VOLTAREN 1 % GEL, Apply 2 g topically 4 (four) times daily. , Disp: , Rfl:   Exam: Current vital signs: BP (!) 164/87 (BP Location: Right Arm)   Pulse 85   Temp 98.6 F (37 C) (Rectal)   Resp 16   Ht 5\' 5"  (1.651 m)   Wt 44.1 kg   SpO2 100%   BMI 16.18 kg/m  Vital signs in last 24 hours: Temp:  [97.8 F (36.6 C)-98.6 F (37 C)] 98.6 F (37 C) (09/10 1242) Pulse Rate:  [85-87] 85 (09/10 1146) Resp:  [16-18] 16 (09/10 1146) BP: (164)/(87) 164/87 (09/10 1146) SpO2:  [100 %] 100 % (09/10 1146) Weight:  [44.1 kg] 44.1 kg (09/10 1143) GENERAL: Awake, alert in NAD HEENT: - Normocephalic and atraumatic, dry mm, no LN++, no Thyromegally LUNGS - Clear to auscultation bilaterally with no wheezes CV - S1S2 RRR, no m/r/g, equal pulses bilaterally. ABDOMEN - Soft, nontender, nondistended with normoactive BS Ext: warm, well perfused, intact peripheral pulses, no edema  NEURO:  Mental Status: Awake, alert, not following commands Language: Mumbling not comprehensible sounds, not following commands. Cranial Nerves: PERRLEOMI, visual fields full, no facial asymmetry Motor: Antigravity in all fours with subtle weakness on the right side.  Poor attention concentration and drifts in both upper extremities before 10 seconds.  Drift in both lower extremities before 5 seconds as well. Tone: is normal and bulk is normal Sensation-grimaces to noxious stimulus in all 4 Coordination: Unable to perform Gait- deferred  NIHSS 1a Level of Conscious.: 0 1b LOC Questions: 2 1c LOC Commands: 2 2 Best Gaze: 0 3 Visual: 0 4 Facial Palsy: 0 5a Motor Arm - left: 1 5b Motor Arm - Right: 1 6a Motor Leg - Left: 1 6b Motor Leg - Right: 1 7 Limb Ataxia: 0 8 Sensory: 0 9 Best Language: 2 10 Dysarthria: 2 11 Extinct. and Inatten.: 0 TOTAL: 12  Labs I have reviewed labs in epic and the results pertinent to this consultation are:  CBC    Component Value Date/Time   WBC 8.2  06/03/2018 1156   RBC 4.44 06/03/2018 1156   HGB 11.6 (L) 06/03/2018 1156   HGB 9.4 (L) 12/17/2017 0939   HCT 39.4 06/03/2018 1156   PLT 263 06/03/2018 1156   PLT 432 (H) 12/17/2017 0939   MCV 88.7 06/03/2018 1156   MCH 26.1 06/03/2018 1156   MCHC 29.4 (L) 06/03/2018 1156   RDW 15.8 (H) 06/03/2018 1156   LYMPHSABS 1.3 06/03/2018 1156   MONOABS 0.7 06/03/2018 1156   EOSABS 0.2 06/03/2018 1156   BASOSABS 0.0 06/03/2018 1156   CMP     Component Value Date/Time   NA 138 06/03/2018 1156   K 4.4 06/03/2018 1156   CL 102 06/03/2018 1156   CO2 25 06/03/2018 1156   GLUCOSE 203 (H) 06/03/2018 1156   GLUCOSE 143 (H) 10/01/2006 2353  BUN 13 06/03/2018 1156   CREATININE 0.68 06/03/2018 1156   CREATININE 0.68 12/17/2017 0939   CALCIUM 9.0 06/03/2018 1156   PROT 6.1 (L) 06/03/2018 1156   ALBUMIN 3.1 (L) 06/03/2018 1156   AST 20 06/03/2018 1156   AST 12 12/17/2017 0939   ALT 14 06/03/2018 1156   ALT 8 12/17/2017 0939   ALKPHOS 82 06/03/2018 1156   BILITOT 0.3 06/03/2018 1156   BILITOT 0.3 12/17/2017 0939   GFRNONAA >60 06/03/2018 1156   GFRNONAA >60 12/17/2017 0939   GFRAA >60 06/03/2018 1156   GFRAA >60 12/17/2017 0939   Lipid Panel     Component Value Date/Time   CHOL 143 04/26/2018 0513   TRIG 173 (H) 04/26/2018 0513   HDL 40 (L) 04/26/2018 0513   CHOLHDL 3.6 04/26/2018 0513   VLDL 35 04/26/2018 0513   LDLCALC 68 04/26/2018 0513   LDLDIRECT 59.2 02/27/2007 0859   Imaging I have reviewed the images obtained:  CT-scan of the brain-no acute changes.  Assessment:  84 year old with past medical history of atrial fibrillation not on anticoagulation, hypertension, dementia, diabetes, last known normal at best of 11 PM on 06/02/2018 presented for evaluation of confusion and aphasia. On my examination, she has mild right hemiparesis, is essentially nonverbal and unable to follow commands. His stroke scale is a total of 12 on arrival. She is outside the window for IV TPA I  have not pursued any vascular imaging because of her premorbid modified Rankin score being 4, which precludes her to be a candidate for endovascular intervention. My differentials at this time include: - Left MCA territory stroke/TIA - Toxic metabolic encephalopathy -?  Seizures  Recommendations: Stat MRI to evaluate for any underlying stroke. Continue on home dose of antiplatelets and statin Telemetry 2D echo Hemoglobin C Fasting lipid panel PT, OT, speech therapy Obtain a routine EEG Check urinalysis Chest  x-ray Check ammonia levels, TSH, B12 Frequent neurochecks-every 2 hours Management of toxic metabolic derangements per primary team Stroke team will follow with you  -- Amie Portland, MD [facility] Pager: [phone] If 7pm to 7am, please call on call as listed on AMION.  CRITICAL CARE ATTESTATION This patient is critically ill and at significant risk of neurological worsening, death and care requires constant monitoring of vital signs, hemodynamics,respiratory and cardiac monitoring. I spent 35  minutes of neurocritical care time performing neurological assessment, discussion with family, other specialists and medical decision making of high complexityin the care of  this patient.  Addendum MRI negative for stroke Likely toxic metabolic encephalopathy versus seizures I would discontinue the 2D echocardiogram, hemoglobin A1c and fasting lipid panel. I would continue with the other things as above. -- Amie Portland, MD [facility] Pager: [phone] If 7pm to 7am, please call on call as listed on AMION.

## 2018-06-03 NOTE — Code Documentation (Signed)
84 yo female coming from home with EMS for altered mental status and slurred speech. Pt was last seen normal at 2300 last night. Caregiver brought her in and EDP evaluated patient. EDP initiated a Code Stroke. Stroke Team evaluated patient and patient is outside the window for tPA. Initial NIHSS 14 due to aphasia, bilateral leg and arm weakness, and inability to answer questions. Pt is also not an endovascular candidate. Code Stroke cancelled. Pt to be monitored in the ED, taken to MRI, and admitted per Dr. Rory Percy.

## 2018-06-03 NOTE — ED Notes (Signed)
Got patient undress on the monitor did ekg shown er doctor patient is with nurse aT BEDSIDE

## 2018-06-03 NOTE — ED Triage Notes (Signed)
Pt from home with ems for AMS and slurred speech. LSN 11pm last night. Home health nurse states this morning she woke up with slurred speech and AMS, pt alert but disoriented, unable to follow commands and has is speaking in garbled sentences. nad at this time.    BP 120/70 Hr70 CBG 251

## 2018-06-03 NOTE — H&P (Signed)
History and Physical    [name]:[id number] DOB: March 12, 1929 DOA: 06/03/2018  PCP: Deland Pretty, MD Consultants:  None Patient coming from:  Home - lives alone; she was on 24/7 caregivers until 2 weeks ago and then has been 9am-11pm caregivers until today; NOK: son, [phone]  Chief Complaint: AMS, aphasia  HPI: [name] is a 84 y.o. female with medical history significant of hypothyroidism; HTN; DM; mild dementia; RA; HLD; and afib not on [facility] presenting with  AMS and aphasia.  She was fine yesterday afternoon and nothing was reported from the 2nd shift person.  This AM, she seemed ok.  Her caregiver got her up and ready about 10 and she started speaking to people who weren't there, talking incoherently, and her right arm dropped to the side and she couldn't use it.  She was able to swallow breakfast without difficulty.  Her caregiver did not notice leg difficulty.  She had a similar presentation one month ago - she was found to have PNA and a UTI.  She just finished an antibiotic yesterday for a UTI.  No fever.  She did not have similar difficulty yesterday.   ED Course:  Caregiver here.  He just called Code Stroke, although he thinks it is metabolic encephalopathy from infection (likely UTI, finished abx today).  Similar presentation recently.  Dr. Rory Percy recommends STAT MRI to r/o CVA.  Review of Systems: Unable to perform  Ambulatory Status:  She can pivot but in wheelchair bound  Past Medical History:  Diagnosis Date  . Acute bronchitis   . Anemia, unspecified   . Atrial fibrillation ([place])    not on AC due to falls  . Depressive disorder, not elsewhere classified   . Diverticulosis of colon (without mention of hemorrhage)   . Esophageal reflux   . Family history of malignant neoplasm of gastrointestinal tract   . Functional diarrhea   . Hiatal hernia   . History of blood transfusion    "once; related to diverticulitis" (11/29/2017)  . Other and  unspecified hyperlipidemia   . Other chest pain   . Other specified cardiac dysrhythmias(427.89)   . Personal history of colonic polyps 07/17/1995   adenomatous polyps  . PONV (postoperative nausea and vomiting)   . Recurrent UTI (urinary tract infection)    "2-3 times in the last 1 1/72yr" (11/29/2017)  . Rheumatoid arthritis ([place])   . Tachycardia, unspecified   . Type II diabetes mellitus ([place])   . Unspecified adverse effect of unspecified drug, medicinal and biological substance   . Unspecified essential hypertension   . Unspecified hypothyroidism     Past Surgical History:  Procedure Laterality Date  . ABDOMINAL HYSTERECTOMY     "partial"  . CATARACT EXTRACTION Bilateral   . FOREARM FRACTURE SURGERY Right 1940s?  . FRACTURE SURGERY    . ORIF FEMUR FRACTURE  08/30/2012   Procedure: OPEN REDUCTION INTERNAL FIXATION (ORIF) DISTAL FEMUR FRACTURE;  Surgeon: Mauri Pole, MD;  Location: [facility] ORS;  Service: Orthopedics;  Laterality: Right;  proximal femur  . THYROIDECTOMY, PARTIAL      Social History   Socioeconomic History  . Marital status: Widowed    Spouse name: Not on file  . Number of children: 3  . Years of education: Not on file  . Highest education level: Not on file  Occupational History    Employer: RETIRED  Social Needs  . Financial resource strain: Not on file  . Food insecurity:  Worry: Not on file    Inability: Not on file  . Transportation needs:    Medical: Not on file    Non-medical: Not on file  Tobacco Use  . Smoking status: Never Smoker  . Smokeless tobacco: Never Used  Substance and Sexual Activity  . Alcohol use: Not Currently    Alcohol/week: 1.0 standard drinks    Types: 1 Glasses of wine per week  . Drug use: No  . Sexual activity: Never  Lifestyle  . Physical activity:    Days per week: Not on file    Minutes per session: Not on file  . Stress: Not on file  Relationships  . Social connections:    Talks on phone: Not on file    Gets  together: Not on file    Attends religious service: Not on file    Active member of club or organization: Not on file    Attends meetings of clubs or organizations: Not on file    Relationship status: Not on file  . Intimate partner violence:    Fear of current or ex partner: Not on file    Emotionally abused: Not on file    Physically abused: Not on file    Forced sexual activity: Not on file  Other Topics Concern  . Not on file  Social History Narrative  . Not on file    No Known Allergies  Family History  Problem Relation Age of Onset  . Colon cancer Maternal Grandmother     Prior to Admission medications   Medication Sig Start Date End Date Taking? Authorizing Provider  feeding supplement, ENSURE ENLIVE, (ENSURE ENLIVE) LIQD Take 237 mLs by mouth 2 (two) times daily between meals. 02/26/18   Lavina Hamman, MD  ferrous sulfate 220 (44 Fe) MG/5ML solution Take 660 mg by mouth daily.    [provider]  leflunomide (ARAVA) 20 MG tablet Take 20 mg by mouth daily. 08/14/17   [provider]  levothyroxine (SYNTHROID) 100 MCG tablet Take 1 tablet (100 mcg total) by mouth daily. 12/07/17 12/07/18  Regalado, Belkys A, MD  magnesium oxide (MAG-OX) 400 (241.3 Mg) MG tablet Take 0.5 tablets (200 mg total) by mouth 2 (two) times daily. 12/07/17   Regalado, Belkys A, MD  Melatonin 2.5 MG CAPS Take 1 capsule (2.5 mg total) by mouth at bedtime as needed (for sleep). Patient taking differently: Take 2.5 mg by mouth at bedtime.  12/07/17   Regalado, Belkys A, MD  metFORMIN (GLUCOPHAGE-XR) 500 MG 24 hr tablet Take 500-1,000 mg by mouth 2 (two) times daily. Take 1000 mg by mouth in the morning and 500 mg by mouth in the evening 03/07/15   [provider]  omeprazole (PRILOSEC) 40 MG capsule Take 1 capsule (40 mg total) by mouth 2 (two) times daily. 12/07/17   Regalado, Belkys A, MD  predniSONE (DELTASONE) 1 MG tablet Take 4 mg by mouth daily with breakfast.     [provider]  TRADJENTA 5 MG TABS tablet Take 5 mg by mouth daily. 01/29/18   [provider]  VOLTAREN 1 % GEL Apply 2 g topically 4 (four) times daily.  11/30/13   [provider]    Physical Exam: Vitals:   06/03/18 1146 06/03/18 1242 06/03/18 1300 06/03/18 1554  BP: (!) 164/87  (!) 180/99 130/90  Pulse: 85  81 84  Resp: 16  19 20   Temp: 97.8 F (36.6 C) 98.6 F (37 C)  TempSrc: Oral Rectal    SpO2: 100%  98% 92%  Weight:      Height:         General:  Appears alert but agitated, does not follow directions or attempt to answer questions, frail and cachectic Eyes:  PERRL, EOMI, normal lids, iris ENT:  grossly normal hearing, lips & tongue Neck:  no LAD, masses or thyromegaly; no carotid bruits Cardiovascular:  Irregularly irregular, rate controlled, no m/r/g. No LE edema.  Respiratory:   CTA bilaterally with no wheezes/rales/rhonchi.  Normal respiratory effort. Abdomen:  soft, NT, ND, NABS Skin:  no rash or induration seen on limited exam Musculoskeletal:  grossly normal tone BUE/BLE, good ROM, no bony abnormality; chronic R>L hand contractures associated with RA Psychiatric: Alert but not apparently oriented, will not answer questions or follow directions Neurologic:Unable to assess    Radiological Exams on Admission: Dg Chest 1 View  Result Date: 06/03/2018 CLINICAL DATA:  Altered mental status. History of bronchitis, atrial fibrillation, diabetes. EXAM: CHEST  1 VIEW COMPARISON:  PA and lateral chest x-ray dated April 26, 2018 FINDINGS: The lungs are well-expanded. There is no focal infiltrate. The interstitial markings are coarse but have improved since the previous study. The heart is top-normal in size. The pulmonary vascularity is not engorged. There is calcification in the wall of the aortic arch. The observed bony thorax is unremarkable. IMPRESSION: Chronic bronchitic changes which have improved somewhat since the previous study. Stable mild  cardiomegaly without pulmonary vascular congestion or pulmonary edema. Thoracic aortic atherosclerosis. Electronically Signed   By: David  Martinique M.D.   On: 06/03/2018 13:18   Ct Head Wo Contrast  Result Date: 06/03/2018 CLINICAL DATA:  Altered mental status with slurred speech EXAM: CT HEAD WITHOUT CONTRAST TECHNIQUE: Contiguous axial images were obtained from the base of the skull through the vertex without intravenous contrast. COMPARISON:  Head CT and brain MRI April 25, 2018 FINDINGS: Brain: Moderate diffuse atrophy is stable. There is no intracranial mass, hemorrhage, extra-axial fluid collection, or midline shift. There is patchy small vessel disease throughout the centra semiovale bilaterally. There is small vessel disease in each external capsule anteriorly. There is small vessel disease in each thalamus. No acute infarct is demonstrable on this study. Vascular: There is no hyperdense vessel. There is calcification in each distal vertebral artery and carotid siphon region. Skull: Bones are somewhat osteoporotic. The bony calvarium appears intact. Sinuses/Orbits: There is mucosal thickening and opacification in several ethmoid air cells. There is a small retention cyst in the left sphenoid sinus. Other visualized paranasal sinuses are clear. Orbits appear symmetric bilaterally with previous cataract extractions. Other: Mastoid air cells are clear. IMPRESSION: Stable atrophy with supratentorial small vessel disease. No acute infarct evident. No mass or hemorrhage. There are foci of arterial vascular calcification. There are areas of paranasal sinus disease. Electronically Signed   By: Lowella Grip III M.D.   On: 06/03/2018 13:30   Mr Brain Wo Contrast  Result Date: 06/03/2018 CLINICAL DATA:  New onset aphasia. Altered mental status. Atrial fibrillation not on anticoagulation. Hypertension dementia diabetes EXAM: MRI HEAD WITHOUT CONTRAST TECHNIQUE: Multiplanar, multiecho pulse sequences of the  brain and surrounding structures were obtained without intravenous contrast. COMPARISON:  CT head 06/03/2018.  MRI head 04/25/2018 FINDINGS: Brain: Image quality degraded by motion. Moderate cortical atrophy.  Negative for hydrocephalus. Negative for acute infarct. Moderate chronic microvascular ischemic change in the white matter and pons. Negative for hemorrhage or mass lesion. Vascular: Normal arterial flow voids Skull  and upper cervical spine: Negative Sinuses/Orbits: Paranasal sinuses clear. Bilateral cataract surgery. Other: None IMPRESSION: Negative for acute infarct. Moderate atrophy and chronic microvascular ischemia Image quality degraded by motion Electronically Signed   By: Franchot Gallo M.D.   On: 06/03/2018 15:39    EKG: Independently reviewed.  Afib with rate 85; nonspecific ST changes with no evidence of acute ischemia   Labs on Admission: I have personally reviewed the available labs and imaging studies at the time of the admission.  Pertinent labs:   Glucose 203 Albumin 3.1  CMP otherwise unremarkable Mag 1.6 Troponin 0.04 Lactate 1.85 WBC 8.2 Hgb 11.6 INR 1.05 TSH 4.506 UA: small LE, rare bacteria, >50 WBC Urine culture pending  Assessment/Plan Principal Problem:   Acute encephalopathy Active Problems:   Hypothyroidism (acquired)   Type 2 diabetes mellitus with other specified complication (HCC)   Essential hypertension   Atrial fibrillation, chronic (HCC)   Acute encephalopathy -Patient presenting with acute onset of confusion, aphasia -Initial concern for code stroke; MRI is negative and so acute CVA has been ruled out -Instead, this appears to be similar to presentation on 8/2; during that hospitalization she was determined to have multifocal/aspiration PNA, which was thought to be the cause of her encephalopathy -She currently has no respiratory symptoms and a negative CXR -She has not had a culture-proven UTI (in Epic), although she did just finish a  course of antibiotics -UA is generally unremarkable at this time, but she was given 1 dose of Rocephin -Will admit and continue to follow -Blood and urine cultures are pending -Although she does not have any apparent SIRS criteria, lactate was checked.  Initial lactate was negative but repeat lactate is mildly elevated.  Will trend, although doubt sepsis at this juncture.  Afib, not on [facility] -CVA was considered based on no AC for this condition -MRI was negative -No AC due to her fall risk -She is rate controlled despite not taking medications for this  HTN -Does not appear to be taking chronic medications for this condition  DM -Recent A1c 6.4 -hold glucophage, Tradjenta -Cover with sensitive-scale SSI  Hypothyroidism -Check TSH -Continue Synthroid at current dose for now  RA -Contractures of R > L hands, chronic -Will cover with stress-dosed steroids until she is able to resume PO prednisone and Arava -Prior hospitalization led to discussion with the son about the possibility of placement rather than in-home caregivers, but he preferred ongoing in-home care   DVT prophylaxis:  Lovenox  Code Status:  DNR - confirmed with family Family Communication: I called and spoke with the patient's son by telephone; he requests daily telephonic updates regarding her condition. Disposition Plan:  Home once clinically improved Consults called: Nutrition/PT/OT/ST Admission status: Admit - It is my clinical opinion that admission to INPATIENT is reasonable and necessary because of the expectation that this patient will require hospital care that crosses at least 2 midnights to treat this condition based on the medical complexity of the problems presented.  Given the aforementioned information, the predictability of an adverse outcome is felt to be significant.    Karmen Bongo MD [facility]  If note is complete, please contact covering daytime or nighttime  physician. [email or web address] Password [facility]  06/03/2018, 4:43 PM

## 2018-06-03 NOTE — ED Notes (Signed)
Pts jewelry (necklace, 2 bracelets and a set of earrings) placed in pts belongings bag with her clothes.

## 2018-06-04 ENCOUNTER — Inpatient Hospital Stay (HOSPITAL_COMMUNITY): Payer: Medicare Other

## 2018-06-04 DIAGNOSIS — G9340 Encephalopathy, unspecified: Secondary | ICD-10-CM

## 2018-06-04 DIAGNOSIS — R4701 Aphasia: Secondary | ICD-10-CM | POA: Diagnosis not present

## 2018-06-04 DIAGNOSIS — R4182 Altered mental status, unspecified: Secondary | ICD-10-CM | POA: Diagnosis present

## 2018-06-04 LAB — LIPID PANEL
CHOLESTEROL: 157 mg/dL (ref 0–200)
HDL: 56 mg/dL (ref >40)
LDL Cholesterol: 83 mg/dL (ref 0–99)
TRIGLYCERIDES: 88 mg/dL (ref <150)
Total CHOL/HDL Ratio: 2.8 RATIO
VLDL: 18 mg/dL (ref 0–40)

## 2018-06-04 LAB — URINE CULTURE: CULTURE: NO GROWTH

## 2018-06-04 LAB — VITAMIN B12: VITAMIN B 12: 214 pg/mL (ref 180–914)

## 2018-06-04 LAB — GLUCOSE, CAPILLARY
Glucose-Capillary: 193 mg/dL — ABNORMAL HIGH (ref 70–99)
Glucose-Capillary: 243 mg/dL — ABNORMAL HIGH (ref 70–99)

## 2018-06-04 LAB — LACTIC ACID, PLASMA: Lactic Acid, Venous: 1.7 mmol/L (ref 0.5–1.9)

## 2018-06-04 MED ORDER — ENOXAPARIN SODIUM 30 MG/0.3ML ~~LOC~~ SOLN: 30.0000 mg | SUBCUTANEOUS | Status: DC

## 2018-06-04 MED ORDER — COLLAGENASE 250 UNIT/GM EX OINT
TOPICAL_OINTMENT | Freq: Every day | CUTANEOUS | Status: DC
  Filled 2018-06-04: qty 30

## 2018-06-04 MED ORDER — INSULIN ASPART 100 UNIT/ML ~~LOC~~ SOLN
4.0000 [IU] | Freq: Once | SUBCUTANEOUS | Status: AC
  Administered 2018-06-04: 4 [IU] via SUBCUTANEOUS

## 2018-06-04 NOTE — Progress Notes (Signed)
While attempting to D/C pt, new orders suggesting home health PT but no orders written. Dr. Sloan Leiter notified and put in orders for [facility] PT.  [name] to be D/C'd Home per MD order.  Discussed prescriptions and follow up appointments with the patient and caregiver [name].  Allergies as of 06/04/2018   No Known Allergies     Medication List    TAKE these medications   feeding supplement (ENSURE ENLIVE) Liqd Take 237 mLs by mouth 2 (two) times daily between meals.   ferrous sulfate 220 (44 Fe) MG/5ML solution Take 660 mg by mouth daily.   leflunomide 20 MG tablet Commonly known as:  ARAVA Take 20 mg by mouth daily.   levothyroxine 100 MCG tablet Commonly known as:  SYNTHROID, LEVOTHROID Take 1 tablet (100 mcg total) by mouth daily.   magnesium oxide 400 (241.3 Mg) MG tablet Commonly known as:  MAG-OX Take 0.5 tablets (200 mg total) by mouth 2 (two) times daily.   Melatonin 2.5 MG Caps Take 1 capsule (2.5 mg total) by mouth at bedtime as needed (for sleep). What changed:  when to take this   metFORMIN 500 MG 24 hr tablet Commonly known as:  GLUCOPHAGE-XR Take 500-1,000 mg by mouth 2 (two) times daily. Take 1000 mg by mouth in the morning and 500 mg by mouth in the evening   omeprazole 40 MG capsule Commonly known as:  PRILOSEC Take 1 capsule (40 mg total) by mouth 2 (two) times daily.   predniSONE 1 MG tablet Commonly known as:  DELTASONE Take 4 mg by mouth daily with breakfast.   TRADJENTA 5 MG Tabs tablet Generic drug:  linagliptin Take 5 mg by mouth daily.   VOLTAREN 1 % Gel Generic drug:  diclofenac sodium Apply 4 g topically 4 (four) times daily. Hands and knees       Vitals:   06/03/18 2200 06/04/18 0450  BP:  130/66  Pulse:  70  Resp:  17  Temp:  98.9 F (37.2 C)  SpO2: 93% 95%    IV catheter discontinued intact. Site without signs and symptoms of complications. Dressing and pressure applied. Pt denies pain at this time. No  complaints noted.  An After Visit Summary was printed and given to the patient. Patient escorted via [place], and D/C home via private auto.  Chapman Fitch BSN, RN [employer] [address] Phone [phone]

## 2018-06-04 NOTE — Care Management Obs Status (Signed)
[place] NOTIFICATION   Patient Details  Name: [name] MRN: [id number] Date of Birth: January 04, 1929   Medicare Observation Status Notification Given:  Yes    Sharin Mons, RN 06/04/2018, 3:03 PM

## 2018-06-04 NOTE — Progress Notes (Signed)
EEG completed, results pending. 

## 2018-06-04 NOTE — Care Management CC44 (Signed)
Condition Code 44 Documentation Completed  Patient Details  Name: [name] MRN: [id number] Date of Birth: [birth year]   Condition Code 44 given:  Yes Patient signature on Condition Code 44 notice:  Yes Documentation of 2 MD's agreement:  Yes Code 44 added to claim:  Yes    Sharin Mons, RN 06/04/2018, 3:04 PM

## 2018-06-04 NOTE — Evaluation (Signed)
Speech Language Pathology Evaluation Patient Details Name: [name] MRN: [id number] DOB: 1929-07-29 Today's Date: 06/04/2018 Time: 0539-7673 SLP Time Calculation (min) (ACUTE ONLY): 10 min  Problem List:  Patient Active Problem List   Diagnosis Date Noted  . Acute encephalopathy 06/03/2018  . Atrial fibrillation, chronic ([place]) 06/03/2018  . Pneumonia 04/26/2018  . TIA (transient ischemic attack) 04/25/2018  . Aphasia   . Pressure injury of skin 02/26/2018  . Hyperkalemia 02/25/2018  . Altered mental status 02/25/2018  . Acute lower UTI 02/25/2018  . Iron deficiency anemia due to chronic blood loss 12/17/2017  . Protein-calorie malnutrition, severe 11/30/2017  . Syncope 11/29/2017  . Diabetic ulcer of left foot associated with type 2 diabetes mellitus ([place]) 08/11/2015  . Closed right hip fracture ([place]) 08/28/2012  . History of gastroesophageal reflux (GERD) 06/26/2011  . Lower GI bleeding 06/26/2011  . DEPRESSION 03/31/2009  . DIARRHEA, FUNCTIONAL 03/31/2009  . DIABETIC HYPOGLYCEMIA, TYPE II 11/03/2008  . ACUTE BRONCHITIS 08/23/2008  . AV NODAL REENTRY TACHYCARDIA 10/29/2007  . TACHYCARDIA 10/29/2007  . CHEST PAIN, ATYPICAL 10/25/2007  . ADVEF, DRUG/MEDICINAL/BIOLOGICAL SUBST NOS 07/16/2007  . Nonspecific (abnormal) findings on radiological and other examination of body structure 04/03/2007  . CHEST XRAY, ABNORMAL 04/03/2007  . Hypothyroidism (acquired) 03/06/2007  . Type 2 diabetes mellitus with other specified complication ([place]) 41/93/7902  . HYPERLIPIDEMIA 03/06/2007  . Essential hypertension 03/06/2007  . DIVERTICULOSIS, COLON 03/06/2007  . Rheumatoid arthritis involving both hands with positive rheumatoid factor ([place]) 03/06/2007   Past Medical History:  Past Medical History:  Diagnosis Date  . Acute bronchitis   . Anemia, unspecified   . Atrial fibrillation ([place])    not on AC due to falls  . Depressive disorder, not elsewhere classified   .  Diverticulosis of colon (without mention of hemorrhage)   . Esophageal reflux   . Family history of malignant neoplasm of gastrointestinal tract   . Functional diarrhea   . Hiatal hernia   . History of blood transfusion    "once; related to diverticulitis" (11/29/2017)  . Other and unspecified hyperlipidemia   . Other chest pain   . Other specified cardiac dysrhythmias(427.89)   . Personal history of colonic polyps 07/17/1995   adenomatous polyps  . PONV (postoperative nausea and vomiting)   . Recurrent UTI (urinary tract infection)    "2-3 times in the last 1 1/45yr" (11/29/2017)  . Rheumatoid arthritis ([place])   . Tachycardia, unspecified   . Type II diabetes mellitus ([place])   . Unspecified adverse effect of unspecified drug, medicinal and biological substance   . Unspecified essential hypertension   . Unspecified hypothyroidism    Past Surgical History:  Past Surgical History:  Procedure Laterality Date  . ABDOMINAL HYSTERECTOMY     "partial"  . CATARACT EXTRACTION Bilateral   . FOREARM FRACTURE SURGERY Right 1940s?  . FRACTURE SURGERY    . ORIF FEMUR FRACTURE  08/30/2012   Procedure: OPEN REDUCTION INTERNAL FIXATION (ORIF) DISTAL FEMUR FRACTURE;  Surgeon: Rhonda Pole, MD;  Location: [facility]S;  Service: Orthopedics;  Laterality: Right;  proximal femur  . THYROIDECTOMY, PARTIAL     HPI:  Pt is an 84 y.o. female with medical history significant of hypothyroidism; HTN; DM; mild dementia; RA; HLD; and afib not on AC who presented with AMS and aphasia. MRI negative for acute infarct.   Assessment / Plan / Recommendation Clinical Impression  Pt has fluent communication comprised of phonemic paraphasias and neologisms. She perseverates on topics, although  not necessarily individual words. Overall she is not aware of errors. Comprehension is also impaired, needing Min-Mod cues for completion of simple commands and Mod cues for comprehension of simple conversation. Her sustained attention is  limited, which may further exacerbate these comprehension errors. Her caregiver at bedside says that the pt's communication is typically [facility], and her mentation is also worse compared to baseline (although she did have memory deficits PTA). Recommend additional SLP f/u to maximize functional communication.    SLP Assessment  SLP Recommendation/Assessment: Patient needs continued Speech Lanaguage Pathology Services SLP Visit Diagnosis: Aphasia (R47.01)    Follow Up Recommendations  Skilled Nursing facility    Frequency and Duration min 2x/week  2 weeks      SLP Evaluation Cognition  Overall Cognitive Status: Difficult to assess(language deficits) Arousal/Alertness: Awake/alert Orientation Level: Oriented to person;Oriented to place;Disoriented to time;Disoriented to situation Attention: Sustained Sustained Attention: Impaired Sustained Attention Impairment: Verbal basic Memory: Impaired Memory Impairment: Decreased recall of new information Awareness: Impaired Awareness Impairment: Emergent impairment Problem Solving: Impaired Problem Solving Impairment: Functional basic Comments: caregiver says she is not at baseline       Comprehension  Auditory Comprehension Overall Auditory Comprehension: Impaired Commands: Impaired One Step Basic Commands: 50-74% accurate Conversation: Simple Interfering Components: Attention;Hearing EffectiveTechniques: Slowed speech;Visual/Gestural cues;Increased volume    Expression Expression Primary Mode of Expression: Verbal Verbal Expression Overall Verbal Expression: Impaired Initiation: No impairment Automatic Speech: (paraphasic errors in stating her name) Level of Generative/Spontaneous Verbalization: Conversation Naming: Impairment Confrontation: Impaired Verbal Errors: Phonemic paraphasias;Neologisms;Perseveration;Not aware of errors Interfering Components: Attention Non-Verbal Means of Communication: Not applicable Written  Expression Dominant Hand: Right   Oral / Motor  Oral Motor/Sensory Function Overall Oral Motor/Sensory Function: Within functional limits Motor Speech Overall Motor Speech: Appears within functional limits for tasks assessed   GO                    Germain Osgood 06/04/2018, 1:45 PM  Germain Osgood, M.A. [facility] Acute Environmental education officer [phone] Office [phone]

## 2018-06-04 NOTE — Evaluation (Signed)
Physical Therapy Evaluation Patient Details Name: [name] MRN: [id number] DOB: 1928/10/06 Today's Date: 06/04/2018   History of Present Illness  84 y.o. female with medical history significant of hypothyroidism; HTN; DM; mild dementia; RA; HLD; and afib not on AC presenting with  AMS and aphasia.   Clinical Impression  Patient presents with decreased mobility and cognition impacting safety and strength.  Feel she will benefit from follow up [place] at d/c to allow maximized safety and independence.  Family planning to increase care to 24 hours.  No further acute PT needs as likely home this pm.     Follow Up Recommendations Home health PT;Supervision/Assistance - 24 hour    Equipment Recommendations  None recommended by PT    Recommendations for Other Services       Precautions / Restrictions Precautions Precautions: Fall      Mobility  Bed Mobility Overal bed mobility: Needs Assistance Bed Mobility: Supine to Sit;Sit to Supine     Supine to sit: Mod assist Sit to supine: Mod assist   General bed mobility comments: assist to supine due to on EOB and having BM, attempted bedpan, but pt refused so assisted back up to [facility] quickly due to incontinence  Transfers Overall transfer level: Needs assistance Equipment used: Rolling walker (2 wheeled);None Transfers: Sit to/from [facility]   Stand pivot transfers: Max assist;Mod assist       General transfer comment: initially with walker from recliner to bed with pt leaning back and poor UE use on walker, so to [facility] with pivot without walker with safer technique, but still heavy mod A  Ambulation/Gait                Stairs            Wheelchair Mobility    Modified Rankin (Stroke Patients Only)       Balance     Sitting balance-Leahy Scale: Fair       Standing balance-Leahy Scale: Poor Standing balance comment: standing mod A to max A for hygiene                               Pertinent Vitals/Pain Pain Assessment: Faces Faces Pain Scale: No hurt    Home Living Family/patient expects to be discharged to:: Private residence Living Arrangements: Alone Available Help at Discharge: Personal care attendant Type of Home: House Home Access: Stairs to enter Entrance Stairs-Rails: None Entrance Stairs-Number of Steps: 2 Home Layout: One level Home Equipment: Walker - 4 wheels;Wheelchair - manual;Shower seat;Bedside commode      Prior Function Level of Independence: Needs assistance   Gait / Transfers Assistance Needed: pivot to the chair  ADL's / Homemaking Assistance Needed: caregiver reports assist with bathing and dressing at baseline. wheeled to bathroom and pivots to toilet. with assist  Comments: was having 9a-11p help, upon return home will have 24 hour assist initially     Hand Dominance   Dominant Hand: Right    Extremity/Trunk Assessment   Upper Extremity Assessment Upper Extremity Assessment: Defer to OT evaluation    Lower Extremity Assessment Lower Extremity Assessment: Generalized weakness    Cervical / Trunk Assessment Cervical / Trunk Assessment: Kyphotic;Other exceptions  Communication   Communication: HOH  Cognition Arousal/Alertness: Awake/alert Behavior During Therapy: Anxious Overall Cognitive Status: Impaired/Different from baseline  General Comments: worse than prior to admission per caregiver, perseverates on things, and repeats question just after they have been answered      General Comments      Exercises     Assessment/Plan    PT Assessment All further PT needs can be met in the next venue of care  PT Problem List Decreased cognition;Decreased balance;Decreased mobility;Decreased safety awareness;Decreased strength       PT Treatment Interventions      PT Goals (Current goals can be found in the Care Plan section)  Acute Rehab PT Goals Patient  Stated Goal: plans to go home this pm PT Goal Formulation: All assessment and education complete, DC therapy    Frequency     Barriers to discharge        Co-evaluation               AM-PAC PT "6 Clicks" Daily Activity  Outcome Measure Difficulty turning over in bed (including adjusting bedclothes, sheets and blankets)?: Unable Difficulty moving from lying on back to sitting on the side of the bed? : Unable Difficulty sitting down on and standing up from a chair with arms (e.g., wheelchair, bedside commode, etc,.)?: Unable Help needed moving to and from a bed to chair (including a wheelchair)?: A Lot Help needed walking in hospital room?: Total Help needed climbing 3-5 steps with a railing? : Total 6 Click Score: 7    End of Session Equipment Utilized During Treatment: Gait belt Activity Tolerance: Patient tolerated treatment well Patient left: in bed;with call bell/phone within reach;with family/visitor present   PT Visit Diagnosis: Muscle weakness (generalized) (M62.81);Other symptoms and signs involving the nervous system (R29.898)    Time: 7639-4320 PT Time Calculation (min) (ACUTE ONLY): 26 min   Charges:   PT Evaluation $PT Eval Moderate Complexity: 1 Mod PT Treatments $Therapeutic Activity: 8-22 mins        Magda Kiel, [place] Acute Rehabilitation Services [phone] 06/04/2018   [name] 06/04/2018, 4:13 PM

## 2018-06-04 NOTE — Evaluation (Signed)
Occupational Therapy Evaluation Patient Details Name: [name] MRN: [id number] DOB: 12-Aug-1929 Today's Date: 06/04/2018    History of Present Illness 84 y.o. female with medical history significant of hypothyroidism; HTN; DM; mild dementia; RA; HLD; and afib not on AC presenting with  AMS and aphasia.    Clinical Impression   Pt admitted with the above diagnoses and presents with below problem list. Pt will benefit from continued acute OT to address the below listed deficits and maximize independence with basic ADLs prior to d/c to venue below. PTA pt was at home with a hired caregiver with her from 7am to 11pm. She needed assist with bathing and dressing and could complete stand-pivot transfers to her w/c with assistance. On OT eval, pt needing +2 mod physical assist for functional transfers and LB ADLs. Recommending SNF as she is currently needing +2 assist and at home only has +1 assist.     Follow Up Recommendations  SNF    Equipment Recommendations  Other (comment)(defer to next venue)    Recommendations for Other Services PT consult     Precautions / Restrictions Precautions Precautions: Fall      Mobility Bed Mobility Overal bed mobility: Needs Assistance Bed Mobility: Supine to Sit     Supine to sit: Min assist;HOB elevated     General bed mobility comments: min steadying assist. HOB elevated  Transfers Overall transfer level: Needs assistance Equipment used: Rolling walker (2 wheeled) Transfers: Sit to/from [facility] Sit to Stand: Mod assist;+2 physical assistance Stand pivot transfers: Mod assist;+2 physical assistance       General transfer comment: from EOB > recliner. Assist to powerup and steady.     Balance Overall balance assessment: Needs assistance Sitting-balance support: Bilateral upper extremity supported;Feet supported Sitting balance-Leahy Scale: Fair Sitting balance - Comments: sat <1 minute prior to transfer.  min guard for safety   Standing balance support: Bilateral upper extremity supported Standing balance-Leahy Scale: Zero Standing balance comment: rw and +2 mod assist to stand                           ADL either performed or assessed with clinical judgement   ADL Overall ADL's : Needs assistance/impaired Eating/Feeding: Set up;Sitting;Bed level   Grooming: Maximal assistance   Upper Body Bathing: Total assistance   Lower Body Bathing: +2 for physical assistance;Sit to/from stand;Maximal assistance   Upper Body Dressing : Maximal assistance;Sitting   Lower Body Dressing: Maximal assistance;+2 for physical assistance;Sit to/from stand   Toilet Transfer: Moderate assistance;+2 for physical assistance;Stand-pivot;RW   Toileting- Clothing Manipulation and Hygiene: Total assistance;+2 for physical assistance;Sit to/from stand   Tub/ Shower Transfer: Maximal assistance;+2 for physical assistance;Stand-pivot;3 in 1;Rolling walker     General ADL Comments: Pt completed bed mobilityand SPT to recliner. Sat EOB <1 min prior to transfer. Fair sitting balance.     Vision Baseline Vision/History: Wears glasses       Perception     Praxis      Pertinent Vitals/Pain Pain Assessment: Faces Faces Pain Scale: Hurts a little bit Pain Location: unspecified. Perseverating on bandage on L foot Pain Intervention(s): Monitored during session     Hand Dominance Right   Extremity/Trunk Assessment Upper Extremity Assessment Upper Extremity Assessment: Difficult to assess due to impaired cognition;Generalized weakness(baseline deficits to B hands and wrist. AE at home for feedi)   Lower Extremity Assessment Lower Extremity Assessment: Defer to PT evaluation  Communication Communication Communication: HOH   Cognition Arousal/Alertness: Awake/alert Behavior During Therapy: Flat affect;Restless Overall Cognitive Status: History of cognitive impairments - at baseline                                  General Comments: able to state location, correctly indentified home aide. Disoriented to year and situation. Confused. Perservation noted.    General Comments       Exercises     Shoulder Instructions      Home Living Family/patient expects to be discharged to:: Private residence Living Arrangements: Alone Available Help at Discharge: Personal care attendant Type of Home: House Home Access: Stairs to enter [employer] of Steps: 2 Entrance Stairs-Rails: None Home Layout: One level     Bathroom Shower/Tub: Teacher, early years/pre: Standard     Home Equipment: Environmental consultant - 4 wheels;Wheelchair - manual;Shower seat;Bedside commode          Prior Functioning/Environment Level of Independence: Needs assistance  Gait / Transfers Assistance Needed: pivot to the chair ADL's / Homemaking Assistance Needed: caregiver reports assist with bathing and dressing at baseline. wheeled to bathroom and pivots to toilet. with assist            OT Problem List: Decreased activity tolerance;Impaired balance (sitting and/or standing);Decreased cognition;Decreased safety awareness;Decreased knowledge of use of DME or AE;Decreased knowledge of precautions      OT Treatment/Interventions: Self-care/ADL training;DME and/or AE instruction;Therapeutic activities;Patient/family education;Balance training;Cognitive remediation/compensation    OT Goals(Current goals can be found in the care plan section) Acute Rehab OT Goals Patient Stated Goal: no family present Time For Goal Achievement: 06/18/18 Potential to Achieve Goals: Good ADL Goals Pt Will Perform Upper Body Bathing: with mod assist;sitting Pt Will Perform Lower Body Bathing: with max assist;sit to/from stand Pt Will Perform Upper Body Dressing: with mod assist;sitting Pt Will Perform Lower Body Dressing: with max assist;sit to/from stand Pt Will Transfer to Toilet: with  max assist;stand pivot transfer;bedside commode Pt Will Perform Toileting - Clothing Manipulation and hygiene: with max assist;sit to/from stand  OT Frequency: Min 2X/week   Barriers to D/C:            Co-evaluation              AM-PAC PT "6 Clicks" Daily Activity     Outcome Measure Help from another person eating meals?: A Little Help from another person taking care of personal grooming?: A Lot Help from another person toileting, which includes using toliet, bedpan, or urinal?: Total Help from another person bathing (including washing, rinsing, drying)?: Total Help from another person to put on and taking off regular upper body clothing?: A Lot Help from another person to put on and taking off regular lower body clothing?: Total 6 Click Score: 10   End of Session Equipment Utilized During Treatment: Rolling walker;Gait belt  Activity Tolerance: Patient tolerated treatment well Patient left: in chair;with call bell/phone within reach;with chair alarm set;with family/visitor present  OT Visit Diagnosis: Unsteadiness on feet (R26.81);Muscle weakness (generalized) (M62.81);Other symptoms and signs involving cognitive function                Time: 7001-7494 OT Time Calculation (min): 27 min Charges:  OT General Charges $OT Visit: 1 Visit OT Evaluation $OT Eval Low Complexity: 1 Low OT Treatments $Self Care/Home Management : 8-22 mins    Tyrone Schimke, OT Acute Rehabilitation Services Pager: [phone] Office:  [phone]  06/04/2018, 12:53 PM

## 2018-06-04 NOTE — Evaluation (Signed)
Clinical/Bedside Swallow Evaluation Patient Details  Name: [name] MRN: [id number] Date of Birth: [birth year]  Today's Date: 06/04/2018 Time: SLP Start Time (ACUTE ONLY): 1127 SLP Stop Time (ACUTE ONLY): 5631 SLP Time Calculation (min) (ACUTE ONLY): 10 min  Past Medical History:  Past Medical History:  Diagnosis Date  . Acute bronchitis   . Anemia, unspecified   . Atrial fibrillation ([place])    not on AC due to falls  . Depressive disorder, not elsewhere classified   . Diverticulosis of colon (without mention of hemorrhage)   . Esophageal reflux   . Family history of malignant neoplasm of gastrointestinal tract   . Functional diarrhea   . Hiatal hernia   . History of blood transfusion    "once; related to diverticulitis" (11/29/2017)  . Other and unspecified hyperlipidemia   . Other chest pain   . Other specified cardiac dysrhythmias(427.89)   . Personal history of colonic polyps 07/17/1995   adenomatous polyps  . PONV (postoperative nausea and vomiting)   . Recurrent UTI (urinary tract infection)    "2-3 times in the last 1 1/54yr" (11/29/2017)  . Rheumatoid arthritis ([place])   . Tachycardia, unspecified   . Type II diabetes mellitus ([place])   . Unspecified adverse effect of unspecified drug, medicinal and biological substance   . Unspecified essential hypertension   . Unspecified hypothyroidism    Past Surgical History:  Past Surgical History:  Procedure Laterality Date  . ABDOMINAL HYSTERECTOMY     "partial"  . CATARACT EXTRACTION Bilateral   . FOREARM FRACTURE SURGERY Right 1940s?  . FRACTURE SURGERY    . ORIF FEMUR FRACTURE  08/30/2012   Procedure: OPEN REDUCTION INTERNAL FIXATION (ORIF) DISTAL FEMUR FRACTURE;  Surgeon: Mauri Pole, MD;  Location: [facility];  Service: Orthopedics;  Laterality: Right;  proximal femur  . THYROIDECTOMY, PARTIAL     HPI:  Pt is an 84 y.o. female with medical history significant of hypothyroidism; HTN; DM; mild dementia; RA; HLD; and  afib not on AC who presented with AMS and aphasia. MRI negative for acute infarct.   Assessment / Plan / Recommendation Clinical Impression  Pt has difficulty masticating soft solids due to missing dentition, as her dentures just broke last week. Her caregiver at bedside says that she has been consuming primarily pureed foods, although she will also eat foods if they are chopped finely. A mild cognitively-based oral dysphagia may likely be present as well, given reduced sustained attention and awareness. However, no overt signs of aspiration are observed. Recommend starting Dys 2 diet and thin liquids with full supervision. SLP to f/u briefly for tolerance. SLP Visit Diagnosis: Dysphagia, unspecified (R13.10)    Aspiration Risk  Mild aspiration risk    Diet Recommendation Dysphagia 2 (Fine chop);Thin liquid   Liquid Administration via: Cup;Straw Medication Administration: Whole meds with liquid Supervision: Staff to assist with self feeding;Full supervision/cueing for compensatory strategies Compensations: Minimize environmental distractions;Slow rate;Small sips/bites Postural Changes: Seated upright at 90 degrees    Other  Recommendations Oral Care Recommendations: Oral care BID   Follow up Recommendations 24 hour supervision/assistance      Frequency and Duration min 2x/week  1 week       Prognosis Prognosis for Safe Diet Advancement: Fair(pt preference without dentures) Barriers to Reach Goals: Other (Comment)(broken dentures)      Swallow Study   General HPI: Pt is an 83 y.o. female with medical history significant of hypothyroidism; HTN; DM; mild dementia; RA; HLD; and afib not  on [facility] who presented with AMS and aphasia. MRI negative for acute infarct. Type of Study: Bedside Swallow Evaluation Previous Swallow Assessment: none in chart Diet Prior to this Study: NPO Temperature Spikes Noted: No Respiratory Status: Room air History of Recent Intubation:  No Behavior/Cognition: Alert;Cooperative;Requires cueing Oral Cavity Assessment: Dry Oral Care Completed by SLP: No Oral Cavity - Dentition: Dentures, top;Missing dentition(lower dentures broke last week per caregiver) Vision: Functional for self-feeding Self-Feeding Abilities: Needs assist Patient Positioning: Upright in bed Baseline Vocal Quality: Normal Volitional Cough: Weak    Oral/Motor/Sensory Function Overall Oral Motor/Sensory Function: Within functional limits   Ice Chips Ice chips: Not tested   Thin Liquid Thin Liquid: Within functional limits Presentation: Cup;Self Fed;Straw    Nectar Thick Nectar Thick Liquid: Not tested   Honey Thick Honey Thick Liquid: Not tested   Puree Puree: Within functional limits Presentation: Spoon   Solid     Solid: Impaired Oral Phase Impairments: Poor awareness of bolus;Impaired mastication      Germain Osgood 06/04/2018,1:24 PM  Germain Osgood, M.A. [employer] Acute Environmental education officer [phone] Office [phone]

## 2018-06-04 NOTE — Care Management Note (Signed)
Case Management Note  Patient Details  Name: [name] MRN: [id number] Date of Birth: June 06, 1929  Subjective/Objective:          Acute encephalopathy         [name] ([address] Clovis Cao (Son)    [id number] [phone]     PCP: [facility] Seen  Action/Plan: Transition to home with home health services in place. Pt states she  will have 24/7 supervision and assistance provided by [facility].and caregiver by [facility] confirmed.  [name] caregiver to provide transportation to home.  Expected Discharge Date:  06/04/18               Expected Discharge Plan:     In-House Referral:  Clinical Social Work  Discharge planning Services  CM Consult  Post Acute Care Choice:    Choice offered to:  Patient  DME Arranged:    DME Agency:     HH Arranged:  RN, PT, OT HH Agency:  [facility]  Status of Service:  Completed, signed off  If discussed at [place] of Stay Meetings, dates discussed:    Additional Comments:  Sharin Mons, RN 06/04/2018, 5:09 PM

## 2018-06-04 NOTE — Consult Note (Signed)
[place] Nurse wound consult note Reason for Consult:Nonhealing chronic wounds to bilateral great toe heads with bunion deformity noted.  Trauma wounds to bilateral lower anterior legs, all present on admission.  Wound type: Pressure Injury POA: Yes/No/NA Measurement: Wound OVP:CHEKBT slough to toe wounds Scabs to anterior lower leg wounds Drainage (amount, consistency, odor) minimal serosanguinous  Musty odor to feet.  Periwound:dry skin Dressing procedure/placement/frequency: CLeanse wounds to bilateral great toes and anterior lower legs with NS.  Apply Santyl to wound bed. Cover with NS moist gauze.  Secure with gauze and kerlix/tape.  Change daily.  Will not follow at this time.  Please re-consult if needed.  Domenic Moras MSN RN FNP-BC  [place] Pager [phone]

## 2018-06-04 NOTE — Progress Notes (Addendum)
NEURO HOSPITALIST PROGRESS NOTE   Subjective: Patient is awake and alert. Still very confused, but no longer aphasic.  Exam: Vitals:   06/03/18 2200 06/04/18 0450  BP:  130/66  Pulse:  70  Resp:  17  Temp:  98.9 F (37.2 C)  SpO2: 93% 95%    Physical Exam   HEENT-  Normocephalic, no lesions, without obvious abnormality.  Normal external eye and conjunctiva.   Cardiovascular- S1-S2 audible, pulses palpable throughout   Lungs-no rhonchi or wheezing noted, no excessive working breathing.  Saturations within normal limits on RA Abdomen- All 4 quadrants palpated and non tender BS present x 4 Extremities- Warm, dry and intact  Musculoskeletal-bouchards nodes on B hands. B feet with deformed toes.  Skin-warm and dry, pressure relief dressings on B feet.    Neuro:  Mental Status: Alert, oriented to name.confused, perseverates at times.  Speech fluent without evidence of aphasia.  Able to follow simple commands without difficulty. Cranial Nerves: II:  Visual fields grossly normal,  III,IV, VI: ptosis not present, extra-ocular motions intact bilaterally pupils equal, round, reactive to light  V,VII: smile symmetric, facial light touch sensation normal bilaterally VIII: hearing normal bilaterally IX,X: uvula rises symmetrically XI: bilateral shoulder shrug XII: midline tongue extension Motor: Right : Upper extremity   5/5    Left:     Upper extremity   5/5  Lower extremity   5/5     Lower extremity   5/5 Tone and bulk:normal tone throughout; no atrophy noted Sensory:  light touch intact throughout, bilaterally Deep Tendon Reflexes: 2+ and symmetric biceps, patella Plantars: Right: downgoing   Left: downgoing Cerebellar: Not tested Gait: deferred    Medications:  Scheduled: .  stroke: mapping our early stages of recovery book   Does not apply Once  . enoxaparin (LOVENOX) injection  40 mg Subcutaneous Q24H  . hydrocortisone sod succinate (SOLU-CORTEF)  inj  50 mg Intravenous Q6H  . insulin aspart  0-9 Units Subcutaneous TID WC   Continuous:  UXL:[id number] **OR** acetaminophen (TYLENOL) oral liquid 160 mg/5 mL **OR** acetaminophen, senna-docusate  Pertinent Labs/Diagnostics: Ammonia: 29 ( WNL) TSH: 4.506 (slightly elevated) UTI: + (ceftriaxone ordered) EEG 06/04/18:  This is an abnormal EEG secondary to general background slowing.  This finding may be seen with a diffuse disturbance that is etiologically nonspecific, but may include a metabolic encephalopathy, among other possibilities.  No epileptiform activity was noted  Dg Chest 1 View  Result Date: 06/03/2018 CLINICAL DATA:  Altered mental status. History of bronchitis, atrial fibrillation, diabetes. EXAM: CHEST  1 VIEW COMPARISON:  PA and lateral chest x-ray dated April 26, 2018 FINDINGS: The lungs are well-expanded. There is no focal infiltrate. The interstitial markings are coarse but have improved since the previous study. The heart is top-normal in size. The pulmonary vascularity is not engorged. There is calcification in the wall of the aortic arch. The observed bony thorax is unremarkable. IMPRESSION: Chronic bronchitic changes which have improved somewhat since the previous study. Stable mild cardiomegaly without pulmonary vascular congestion or pulmonary edema. Thoracic aortic atherosclerosis. Electronically Signed   By: David  Martinique M.D.   On: 06/03/2018 13:18   Ct Head Wo Contrast  Result Date: 06/03/2018 CLINICAL DATA:  Altered mental status with slurred speech EXAM: CT HEAD WITHOUT CONTRAST TECHNIQUE: Contiguous axial images were obtained from the base of the skull through the vertex without  intravenous contrast. COMPARISON:  Head CT and brain MRI April 25, 2018 FINDINGS: Brain: Moderate diffuse atrophy is stable. There is no intracranial mass, hemorrhage, extra-axial fluid collection, or midline shift. There is patchy small vessel disease throughout the centra semiovale  bilaterally. There is small vessel disease in each external capsule anteriorly. There is small vessel disease in each thalamus. No acute infarct is demonstrable on this study. Vascular: There is no hyperdense vessel. There is calcification in each distal vertebral artery and carotid siphon region. Skull: Bones are somewhat osteoporotic. The bony calvarium appears intact. Sinuses/Orbits: There is mucosal thickening and opacification in several ethmoid air cells. There is a small retention cyst in the left sphenoid sinus. Other visualized paranasal sinuses are clear. Orbits appear symmetric bilaterally with previous cataract extractions. Other: Mastoid air cells are clear. IMPRESSION: Stable atrophy with supratentorial small vessel disease. No acute infarct evident. No mass or hemorrhage. There are foci of arterial vascular calcification. There are areas of paranasal sinus disease. Electronically Signed   By: Lowella Grip III M.D.   On: 06/03/2018 13:30   Mr Brain Wo Contrast  Result Date: 06/03/2018 CLINICAL DATA:  New onset aphasia. Altered mental status. Atrial fibrillation not on anticoagulation. Hypertension dementia diabetes EXAM: MRI HEAD WITHOUT CONTRAST TECHNIQUE: Multiplanar, multiecho pulse sequences of the brain and surrounding structures were obtained without intravenous contrast. COMPARISON:  CT head 06/03/2018.  MRI head 04/25/2018 FINDINGS: Brain: Image quality degraded by motion. Moderate cortical atrophy.  Negative for hydrocephalus. Negative for acute infarct. Moderate chronic microvascular ischemic change in the white matter and pons. Negative for hemorrhage or mass lesion. Vascular: Normal arterial flow voids Skull and upper cervical spine: Negative Sinuses/Orbits: Paranasal sinuses clear. Bilateral cataract surgery. Other: None IMPRESSION: Negative for acute infarct. Moderate atrophy and chronic microvascular ischemia Image quality degraded by motion Electronically Signed   By: Franchot Gallo M.D.   On: 06/03/2018 15:39   EEG-slowing, nonspecific.  No focality.  Assessment:  84 year old with past medical history of atrial fibrillation not on anticoagulation, hypertension, dementia, diabetes, last known normal at best of 11 PM on 06/02/2018 presented for evaluation of confusion and aphasia. On my examination, per [facility] aide at bedside her mental status is not back to baseline, but her function is back to baseline.No vascular imaging because of her premorbid modified Rankin score being 4, which precludes her to be a candidate for endovascular intervention. MRI was negative for stroke. EEG showed no epileptiform activity and B12 is pending. Chest x-ray: negative for any infiltrates. Given patients improvements, with fluids, and treatment of UTI, AMS most likely d/t toxic metabolic encephalopathy. EEG negative for any acute stroke.  Impression:  Likely toxic metabolic encephalopathy  Recommendations:  --Continue on home dose of antiplatelets and statin --b12 ( Pending)-replete as necessary --No indication for antiepileptics --Outpatient follow-up with neurology for dementia --Management of toxic metabolic derangements per primary team  -Neurology will sign off at this time, page with any further concerns  Laurey Morale, MSN, NP-C [facility] [phone]  06/04/2018, 10:53 AM  Attending Neurohospitalist Addendum Patient seen and examined with APP/Resident. Agree with the history and physical as documented above. Agree with the plan as documented, which I helped formulate. I have independently reviewed the chart, obtained history, review of systems and examined the patient.I have personally reviewed pertinent head/neck/spine imaging (CT/MRI). Please feel free to call with any questions. --- Amie Portland, MD [facility] Pager: [phone]  If 7pm to 7am, please call on call as listed on  AMION.

## 2018-06-04 NOTE — Discharge Summary (Signed)
Physician Discharge Summary  [name] GQQ:[id number] DOB: 1929/09/15 DOA: 06/03/2018  PCP: Rhonda Pretty, MD  Admit date: 06/03/2018 Discharge date: 06/04/2018  Admitted From: home Disposition: home with home caregiver   Recommendations for Outpatient Follow-up:  1. Follow up with PCP in 1-2 weeks  Home Health: none Equipment/Devices: none  Discharge Condition: medically stable CODE STATUS: DNR Diet recommendation: Dysphasia 2  HPI: Per Dr. Lorin Ryan- [name] is a 84 y.o. female with medical history significant of hypothyroidism; HTN; DM; mild dementia; RA; HLD; and afib not on [facility] presenting with  AMS and aphasia.  She was fine yesterday afternoon and nothing was reported from the 2nd shift person.  This AM, she seemed ok.  Her caregiver got her up and ready about 10 and she started speaking to people who weren't there, talking incoherently, and her right arm dropped to the side and she couldn't use it.  She was able to swallow breakfast without difficulty.  Her caregiver did not notice leg difficulty.  She had a similar presentation one month ago - she was found to have PNA and a UTI.  She just finished an antibiotic yesterday for a UTI.  No fever.  She did not have similar difficulty yesterday  Hospital Course: Altered mental status, aphasia - Patient was taken to ED after home caregiver noticed AMS and slurred speak. A code stroke was called, neurology consulted, and STAT MRI was negative for acute infarct. CXR was negative for pneumonia, which she was hospitalized for 1 month ago. Initially, metabolic encephalopathy was suspected in the setting of infection likely from recent UTI treated with antibiotics. She was treated with 1 dose Rocephin. Blood and urine cultures were negative for growth. In absence of infection, suspect the patient's symptoms are likely due to worsening dementia. Patient is medically stable and will be discharged home on home meds.  Afib, not on  AC due to fall risk - Patient has a history of Afib and does not take anticoagulation therapy due to fall risk. MRI was negative and ruled out CVA. Patient remained rate controlled throughout hospital stay. Continue home meds.  DM - Patient was on sliding scale insulin while admitted and blood sugars were stable. Resume home meds and discontinue insulin.  RA - Patient was covered with stress-dosed steroid while admitted. Resume home prednisone and [place] Hypothyroidism - TSH within normal limits, continue Synthroid  Discharge Diagnoses:  Principal Problem:   Acute encephalopathy Active Problems:   Hypothyroidism (acquired)   Type 2 diabetes mellitus with other specified complication ([place])   Essential hypertension   Atrial fibrillation, chronic [facility])   Discharge Instructions  Discharge Instructions    Call MD for:  persistant nausea and vomiting   Complete by:  As directed    Call MD for:  temperature >100.4   Complete by:  As directed    Diet - low sodium heart healthy   Complete by:  As directed    Diet Recommendation:Dysphagia 2 (Fine chop);Thin liquid  Liquid Administration via: Cup;Straw Medication Administration: Whole meds with liquid Supervision: Staff to assist with self feeding;Full supervision/cueing for compensatory strategies Compensations: Minimize environmental distractions;Slow rate;Small sips/bites Postural Changes: Seated upright at 90 degrees   Increase activity slowly   Complete by:  As directed      Allergies as of 06/04/2018   No Known Allergies     Medication List    TAKE these medications   feeding supplement (ENSURE ENLIVE) Liqd Take 237 mLs by mouth  2 (two) times daily between meals.   ferrous sulfate 220 (44 Fe) MG/5ML solution Take 660 mg by mouth daily.   leflunomide 20 MG tablet Commonly known as:  ARAVA Take 20 mg by mouth daily.   levothyroxine 100 MCG tablet Commonly known as:  SYNTHROID, LEVOTHROID Take 1 tablet (100 mcg total)  by mouth daily.   magnesium oxide 400 (241.3 Mg) MG tablet Commonly known as:  MAG-OX Take 0.5 tablets (200 mg total) by mouth 2 (two) times daily.   Melatonin 2.5 MG Caps Take 1 capsule (2.5 mg total) by mouth at bedtime as needed (for sleep). What changed:  when to take this   metFORMIN 500 MG 24 hr tablet Commonly known as:  GLUCOPHAGE-XR Take 500-1,000 mg by mouth 2 (two) times daily. Take 1000 mg by mouth in the morning and 500 mg by mouth in the evening   omeprazole 40 MG capsule Commonly known as:  PRILOSEC Take 1 capsule (40 mg total) by mouth 2 (two) times daily.   predniSONE 1 MG tablet Commonly known as:  DELTASONE Take 4 mg by mouth daily with breakfast.   TRADJENTA 5 MG Tabs tablet Generic drug:  linagliptin Take 5 mg by mouth daily.   VOLTAREN 1 % Gel Generic drug:  diclofenac sodium Apply 4 g topically 4 (four) times daily. Hands and knees      Follow-up Information    Rhonda Pretty, MD. Schedule an appointment as soon as possible for a visit in 1 week(s).   Specialty:  Internal Medicine Contact information: [address] [place] [address] [phone]           Consultations:  Wound care 9/11  Neurology 9/10  Procedures/Studies:  none  Dg Chest 1 View  Result Date: 06/03/2018 CLINICAL DATA:  Altered mental status. History of bronchitis, atrial fibrillation, diabetes. EXAM: CHEST  1 VIEW COMPARISON:  PA and lateral chest x-ray dated April 26, 2018 FINDINGS: The lungs are well-expanded. There is no focal infiltrate. The interstitial markings are coarse but have improved since the previous study. The heart is top-normal in size. The pulmonary vascularity is not engorged. There is calcification in the wall of the aortic arch. The observed bony thorax is unremarkable. IMPRESSION: Chronic bronchitic changes which have improved somewhat since the previous study. Stable mild cardiomegaly without pulmonary vascular congestion or  pulmonary edema. Thoracic aortic atherosclerosis. Electronically Signed   By: David  Martinique M.D.   On: 06/03/2018 13:18   Ct Head Wo Contrast  Result Date: 06/03/2018 CLINICAL DATA:  Altered mental status with slurred speech EXAM: CT HEAD WITHOUT CONTRAST TECHNIQUE: Contiguous axial images were obtained from the base of the skull through the vertex without intravenous contrast. COMPARISON:  Head CT and brain MRI April 25, 2018 FINDINGS: Brain: Moderate diffuse atrophy is stable. There is no intracranial mass, hemorrhage, extra-axial fluid collection, or midline shift. There is patchy small vessel disease throughout the centra semiovale bilaterally. There is small vessel disease in each external capsule anteriorly. There is small vessel disease in each thalamus. No acute infarct is demonstrable on this study. Vascular: There is no hyperdense vessel. There is calcification in each distal vertebral artery and carotid siphon region. Skull: Bones are somewhat osteoporotic. The bony calvarium appears intact. Sinuses/Orbits: There is mucosal thickening and opacification in several ethmoid air cells. There is a small retention cyst in the left sphenoid sinus. Other visualized paranasal sinuses are clear. Orbits appear symmetric bilaterally with previous cataract extractions. Other: Mastoid air  cells are clear. IMPRESSION: Stable atrophy with supratentorial small vessel disease. No acute infarct evident. No mass or hemorrhage. There are foci of arterial vascular calcification. There are areas of paranasal sinus disease. Electronically Signed   By: Lowella Grip III M.D.   On: 06/03/2018 13:30   Mr Brain Wo Contrast  Result Date: 06/03/2018 CLINICAL DATA:  New onset aphasia. Altered mental status. Atrial fibrillation not on anticoagulation. Hypertension dementia diabetes EXAM: MRI HEAD WITHOUT CONTRAST TECHNIQUE: Multiplanar, multiecho pulse sequences of the brain and surrounding structures were obtained without  intravenous contrast. COMPARISON:  CT head 06/03/2018.  MRI head 04/25/2018 FINDINGS: Brain: Image quality degraded by motion. Moderate cortical atrophy.  Negative for hydrocephalus. Negative for acute infarct. Moderate chronic microvascular ischemic change in the white matter and pons. Negative for hemorrhage or mass lesion. Vascular: Normal arterial flow voids Skull and upper cervical spine: Negative Sinuses/Orbits: Paranasal sinuses clear. Bilateral cataract surgery. Other: None IMPRESSION: Negative for acute infarct. Moderate atrophy and chronic microvascular ischemia Image quality degraded by motion Electronically Signed   By: Franchot Gallo M.D.   On: 06/03/2018 15:39      Subjective: Alert and able to answer questions, using right arm   Discharge Exam: Vitals:   06/03/18 2200 06/04/18 0450  BP:  130/66  Pulse:  70  Resp:  17  Temp:  98.9 F (37.2 C)  SpO2: 93% 95%    General: Awake and alert, frail appearing Cardiovascular: regular rate and rhythm, no murmurs, no LE edema Respiratory: clear to auscultation bilaterally, no wheezing, no crackles. Normal respiratory effort. No accessory muscle use.  Abdominal: soft, non tender to palpation Extremities: bilateral dressings on lower extremities    The results of significant diagnostics from this hospitalization (including imaging, microbiology, ancillary and laboratory) are listed below for reference.     Microbiology: Recent Results (from the past 240 hour(s))  Urine culture     Status: None   Collection Time: 06/03/18 12:14 PM  Result Value Ref Range Status   Specimen Description URINE, RANDOM  Final   Special Requests NONE  Final   Culture   Final    NO GROWTH Performed at [facility], [address], [place], [place] [address]    Report Status 06/04/2018 FINAL  Final  Blood culture (routine x 2)     Status: None (Preliminary result)   Collection Time: 06/03/18  3:43 PM  Result Value Ref Range Status    Specimen Description BLOOD RIGHT ANTECUBITAL  Final   Special Requests   Final    BOTTLES DRAWN AEROBIC AND ANAEROBIC Blood Culture results may not be optimal due to an excessive volume of blood received in culture bottles   Culture   Final    NO GROWTH < 24 HOURS Performed at [facility], [place] [address], [place], [place] [address]    Report Status PENDING  Incomplete  Blood culture (routine x 2)     Status: None (Preliminary result)   Collection Time: 06/03/18  3:47 PM  Result Value Ref Range Status   Specimen Description BLOOD LEFT FOREARM  Final   Special Requests   Final    BOTTLES DRAWN AEROBIC ONLY Blood Culture results may not be optimal due to an inadequate volume of blood received in culture bottles   Culture   Final    NO GROWTH < 24 HOURS Performed at [facility], [place] [address], [place], [place] [address]    Report Status PENDING  Incomplete     Labs: BNP (last 3 results) No results for input(s): BNP in the last 8760 hours. Basic Metabolic Panel: Recent Labs  Lab 06/03/18 1156 06/03/18 1213  NA 138  --   K 4.4  --   CL 102  --   CO2 25  --   GLUCOSE 203*  --   BUN 13  --   CREATININE 0.68  --   CALCIUM 9.0  --   MG  --  1.6*   Liver Function Tests: Recent Labs  Lab 06/03/18 1156  AST 20  ALT 14  ALKPHOS 82  BILITOT 0.3  PROT 6.1*  ALBUMIN 3.1*   Recent Labs  Lab 06/03/18 1213  LIPASE 42   Recent Labs  Lab 06/03/18 1214  AMMONIA 29   CBC: Recent Labs  Lab 06/03/18 1156  WBC 8.2  NEUTROABS 5.8  HGB 11.6*  HCT 39.4  MCV 88.7  PLT 263   Cardiac Enzymes: No results for input(s): CKTOTAL, CKMB, CKMBINDEX, TROPONINI in the last 168 hours. BNP: Invalid input(s): POCBNP CBG: Recent Labs  Lab 06/03/18 1150 06/03/18 1714 06/03/18 2310 06/04/18 0840 06/04/18 1247  GLUCAP 184* 204* 313* 193* 243*   D-Dimer No results for input(s): DDIMER in the last 72 hours. Hgb A1c No results for input(s): HGBA1C in the last 72  hours. Lipid Profile Recent Labs    06/04/18 0334  CHOL 157  HDL 56  LDLCALC 83  TRIG 88  CHOLHDL 2.8   Thyroid function studies Recent Labs    06/03/18 1213  TSH 4.506*   Anemia work up Recent Labs    06/04/18 1113  VITAMINB12 214   Urinalysis    Component Value Date/Time   COLORURINE YELLOW 06/03/2018 [place] 06/03/2018 1214   LABSPEC 1.017 06/03/2018 1214   PHURINE 6.0 06/03/2018 1214   GLUCOSEU NEGATIVE 06/03/2018 1214   [place] 06/03/2018 1214   HGBUR negative 03/31/2009 [place] 06/03/2018 1214   [place] 06/03/2018 1214   PROTEINUR NEGATIVE 06/03/2018 1214   UROBILINOGEN 0.2 08/28/2012 0543   NITRITE NEGATIVE 06/03/2018 1214   LEUKOCYTESUR SMALL (A) 06/03/2018 1214   Sepsis Labs Invalid input(s): PROCALCITONIN,  WBC,  LACTICIDVEN   Time coordinating discharge: 35 minutes  SIGNED:  Larene Beach Abbas Beyene, PA-S  06/04/2018, 2:28 PM  If 7PM-7AM, please contact night-coverage [email or web address] Password [email or web address]

## 2018-06-04 NOTE — Procedures (Signed)
ELECTROENCEPHALOGRAM REPORT   Patient: [name]       Room #: 5O03B EEG No. ID: [id number] Age: 84 y.o.        Sex: female Referring Physician: Ghimire Report Date:  06/04/2018        Interpreting Physician: Alexis Goodell  History: [name] is an 84 y.o. female with altered mental status and slurred speech  Medications:  Solu-cortef, Insulin  Conditions of Recording:  This is a 21 channel routine scalp EEG performed with bipolar and monopolar montages arranged in accordance to the international 10/20 system of electrode placement. One channel was dedicated to EKG recording.  The patient is in the awake and drowsy states.  Description:  During wakefulness the background activity is slow and consists of a mixture of low voltage theta and delta activity.  Delta activity is most predominant.  Posterior background rhythm is noted at a 6Hz  theta.   This activity is diffusely distributed.  When the patient drowses the background activity slows even further with less theta activity noted.   Stage II sleep is not obtained. No epileptiform activity is noted.   Hyperventilation and intermittent photic stimulation were not performed.  IMPRESSION: This is an abnormal EEG secondary to general background slowing.  This finding may be seen with a diffuse disturbance that is etiologically nonspecific, but may include a metabolic encephalopathy, among other possibilities.  No epileptiform activity was noted.     Alexis Goodell, MD Neurology [phone] 06/04/2018, 10:58 AM

## 2018-06-06 DIAGNOSIS — D649 Anemia, unspecified: Secondary | ICD-10-CM | POA: Diagnosis not present

## 2018-06-06 DIAGNOSIS — M069 Rheumatoid arthritis, unspecified: Secondary | ICD-10-CM | POA: Diagnosis not present

## 2018-06-06 DIAGNOSIS — I10 Essential (primary) hypertension: Secondary | ICD-10-CM | POA: Diagnosis not present

## 2018-06-06 DIAGNOSIS — E039 Hypothyroidism, unspecified: Secondary | ICD-10-CM | POA: Diagnosis not present

## 2018-06-06 DIAGNOSIS — F0390 Unspecified dementia without behavioral disturbance: Secondary | ICD-10-CM | POA: Diagnosis not present

## 2018-06-06 DIAGNOSIS — G9341 Metabolic encephalopathy: Secondary | ICD-10-CM | POA: Diagnosis not present

## 2018-06-06 DIAGNOSIS — S81001D Unspecified open wound, right knee, subsequent encounter: Secondary | ICD-10-CM | POA: Diagnosis not present

## 2018-06-06 DIAGNOSIS — I482 Chronic atrial fibrillation: Secondary | ICD-10-CM | POA: Diagnosis not present

## 2018-06-06 DIAGNOSIS — L97511 Non-pressure chronic ulcer of other part of right foot limited to breakdown of skin: Secondary | ICD-10-CM | POA: Diagnosis not present

## 2018-06-06 DIAGNOSIS — S81801D Unspecified open wound, right lower leg, subsequent encounter: Secondary | ICD-10-CM | POA: Diagnosis not present

## 2018-06-06 DIAGNOSIS — E11621 Type 2 diabetes mellitus with foot ulcer: Secondary | ICD-10-CM | POA: Diagnosis not present

## 2018-06-06 DIAGNOSIS — Z87440 Personal history of urinary (tract) infections: Secondary | ICD-10-CM | POA: Diagnosis not present

## 2018-06-06 DIAGNOSIS — E785 Hyperlipidemia, unspecified: Secondary | ICD-10-CM | POA: Diagnosis not present

## 2018-06-06 DIAGNOSIS — S81802D Unspecified open wound, left lower leg, subsequent encounter: Secondary | ICD-10-CM | POA: Diagnosis not present

## 2018-06-06 DIAGNOSIS — Z7984 Long term (current) use of oral hypoglycemic drugs: Secondary | ICD-10-CM | POA: Diagnosis not present

## 2018-06-07 DIAGNOSIS — I482 Chronic atrial fibrillation: Secondary | ICD-10-CM | POA: Diagnosis not present

## 2018-06-07 DIAGNOSIS — S81801D Unspecified open wound, right lower leg, subsequent encounter: Secondary | ICD-10-CM | POA: Diagnosis not present

## 2018-06-07 DIAGNOSIS — L97511 Non-pressure chronic ulcer of other part of right foot limited to breakdown of skin: Secondary | ICD-10-CM | POA: Diagnosis not present

## 2018-06-07 DIAGNOSIS — G9341 Metabolic encephalopathy: Secondary | ICD-10-CM | POA: Diagnosis not present

## 2018-06-07 DIAGNOSIS — F0390 Unspecified dementia without behavioral disturbance: Secondary | ICD-10-CM | POA: Diagnosis not present

## 2018-06-07 DIAGNOSIS — E11621 Type 2 diabetes mellitus with foot ulcer: Secondary | ICD-10-CM | POA: Diagnosis not present

## 2018-06-08 DIAGNOSIS — I482 Chronic atrial fibrillation: Secondary | ICD-10-CM | POA: Diagnosis not present

## 2018-06-08 DIAGNOSIS — E11621 Type 2 diabetes mellitus with foot ulcer: Secondary | ICD-10-CM | POA: Diagnosis not present

## 2018-06-08 DIAGNOSIS — S81801D Unspecified open wound, right lower leg, subsequent encounter: Secondary | ICD-10-CM | POA: Diagnosis not present

## 2018-06-08 DIAGNOSIS — F0390 Unspecified dementia without behavioral disturbance: Secondary | ICD-10-CM | POA: Diagnosis not present

## 2018-06-08 DIAGNOSIS — G9341 Metabolic encephalopathy: Secondary | ICD-10-CM | POA: Diagnosis not present

## 2018-06-08 DIAGNOSIS — L97511 Non-pressure chronic ulcer of other part of right foot limited to breakdown of skin: Secondary | ICD-10-CM | POA: Diagnosis not present

## 2018-06-08 LAB — CULTURE, BLOOD (ROUTINE X 2)
CULTURE: NO GROWTH
Culture: NO GROWTH

## 2018-06-09 DIAGNOSIS — I482 Chronic atrial fibrillation: Secondary | ICD-10-CM | POA: Diagnosis not present

## 2018-06-09 DIAGNOSIS — S81801D Unspecified open wound, right lower leg, subsequent encounter: Secondary | ICD-10-CM | POA: Diagnosis not present

## 2018-06-09 DIAGNOSIS — E11621 Type 2 diabetes mellitus with foot ulcer: Secondary | ICD-10-CM | POA: Diagnosis not present

## 2018-06-09 DIAGNOSIS — F0390 Unspecified dementia without behavioral disturbance: Secondary | ICD-10-CM | POA: Diagnosis not present

## 2018-06-09 DIAGNOSIS — G9341 Metabolic encephalopathy: Secondary | ICD-10-CM | POA: Diagnosis not present

## 2018-06-09 DIAGNOSIS — L97511 Non-pressure chronic ulcer of other part of right foot limited to breakdown of skin: Secondary | ICD-10-CM | POA: Diagnosis not present

## 2018-06-10 DIAGNOSIS — N3090 Cystitis, unspecified without hematuria: Secondary | ICD-10-CM | POA: Diagnosis not present

## 2018-06-10 DIAGNOSIS — R3915 Urgency of urination: Secondary | ICD-10-CM | POA: Diagnosis not present

## 2018-06-10 DIAGNOSIS — N390 Urinary tract infection, site not specified: Secondary | ICD-10-CM | POA: Diagnosis not present

## 2018-06-16 DIAGNOSIS — E11622 Type 2 diabetes mellitus with other skin ulcer: Secondary | ICD-10-CM | POA: Diagnosis not present

## 2018-06-16 DIAGNOSIS — I10 Essential (primary) hypertension: Secondary | ICD-10-CM | POA: Diagnosis not present

## 2018-06-16 DIAGNOSIS — E11621 Type 2 diabetes mellitus with foot ulcer: Secondary | ICD-10-CM | POA: Diagnosis not present

## 2018-06-16 DIAGNOSIS — L97522 Non-pressure chronic ulcer of other part of left foot with fat layer exposed: Secondary | ICD-10-CM | POA: Diagnosis not present

## 2018-06-16 DIAGNOSIS — M069 Rheumatoid arthritis, unspecified: Secondary | ICD-10-CM | POA: Diagnosis not present

## 2018-06-16 DIAGNOSIS — L97812 Non-pressure chronic ulcer of other part of right lower leg with fat layer exposed: Secondary | ICD-10-CM | POA: Diagnosis not present

## 2018-06-18 DIAGNOSIS — R399 Unspecified symptoms and signs involving the genitourinary system: Secondary | ICD-10-CM | POA: Diagnosis not present

## 2018-06-18 DIAGNOSIS — Z23 Encounter for immunization: Secondary | ICD-10-CM | POA: Diagnosis not present

## 2018-06-19 DIAGNOSIS — E11621 Type 2 diabetes mellitus with foot ulcer: Secondary | ICD-10-CM | POA: Diagnosis not present

## 2018-06-19 DIAGNOSIS — G9341 Metabolic encephalopathy: Secondary | ICD-10-CM | POA: Diagnosis not present

## 2018-06-19 DIAGNOSIS — F0390 Unspecified dementia without behavioral disturbance: Secondary | ICD-10-CM | POA: Diagnosis not present

## 2018-06-19 DIAGNOSIS — L97511 Non-pressure chronic ulcer of other part of right foot limited to breakdown of skin: Secondary | ICD-10-CM | POA: Diagnosis not present

## 2018-06-19 DIAGNOSIS — I482 Chronic atrial fibrillation: Secondary | ICD-10-CM | POA: Diagnosis not present

## 2018-06-19 DIAGNOSIS — S81801D Unspecified open wound, right lower leg, subsequent encounter: Secondary | ICD-10-CM | POA: Diagnosis not present

## 2018-06-20 DIAGNOSIS — E11621 Type 2 diabetes mellitus with foot ulcer: Secondary | ICD-10-CM | POA: Diagnosis not present

## 2018-06-20 DIAGNOSIS — F0390 Unspecified dementia without behavioral disturbance: Secondary | ICD-10-CM | POA: Diagnosis not present

## 2018-06-20 DIAGNOSIS — I482 Chronic atrial fibrillation: Secondary | ICD-10-CM | POA: Diagnosis not present

## 2018-06-20 DIAGNOSIS — G9341 Metabolic encephalopathy: Secondary | ICD-10-CM | POA: Diagnosis not present

## 2018-06-20 DIAGNOSIS — S81801D Unspecified open wound, right lower leg, subsequent encounter: Secondary | ICD-10-CM | POA: Diagnosis not present

## 2018-06-20 DIAGNOSIS — L97511 Non-pressure chronic ulcer of other part of right foot limited to breakdown of skin: Secondary | ICD-10-CM | POA: Diagnosis not present

## 2018-06-23 DIAGNOSIS — S81801D Unspecified open wound, right lower leg, subsequent encounter: Secondary | ICD-10-CM | POA: Diagnosis not present

## 2018-06-23 DIAGNOSIS — I482 Chronic atrial fibrillation: Secondary | ICD-10-CM | POA: Diagnosis not present

## 2018-06-23 DIAGNOSIS — F0390 Unspecified dementia without behavioral disturbance: Secondary | ICD-10-CM | POA: Diagnosis not present

## 2018-06-23 DIAGNOSIS — L97511 Non-pressure chronic ulcer of other part of right foot limited to breakdown of skin: Secondary | ICD-10-CM | POA: Diagnosis not present

## 2018-06-23 DIAGNOSIS — G9341 Metabolic encephalopathy: Secondary | ICD-10-CM | POA: Diagnosis not present

## 2018-06-23 DIAGNOSIS — E11621 Type 2 diabetes mellitus with foot ulcer: Secondary | ICD-10-CM | POA: Diagnosis not present

## 2018-06-24 DIAGNOSIS — E11621 Type 2 diabetes mellitus with foot ulcer: Secondary | ICD-10-CM | POA: Diagnosis not present

## 2018-06-24 DIAGNOSIS — I482 Chronic atrial fibrillation: Secondary | ICD-10-CM | POA: Diagnosis not present

## 2018-06-24 DIAGNOSIS — L97511 Non-pressure chronic ulcer of other part of right foot limited to breakdown of skin: Secondary | ICD-10-CM | POA: Diagnosis not present

## 2018-06-24 DIAGNOSIS — G9341 Metabolic encephalopathy: Secondary | ICD-10-CM | POA: Diagnosis not present

## 2018-06-24 DIAGNOSIS — F0390 Unspecified dementia without behavioral disturbance: Secondary | ICD-10-CM | POA: Diagnosis not present

## 2018-06-24 DIAGNOSIS — S81801D Unspecified open wound, right lower leg, subsequent encounter: Secondary | ICD-10-CM | POA: Diagnosis not present

## 2018-06-26 DIAGNOSIS — G9341 Metabolic encephalopathy: Secondary | ICD-10-CM | POA: Diagnosis not present

## 2018-06-26 DIAGNOSIS — F0390 Unspecified dementia without behavioral disturbance: Secondary | ICD-10-CM | POA: Diagnosis not present

## 2018-06-26 DIAGNOSIS — I482 Chronic atrial fibrillation: Secondary | ICD-10-CM | POA: Diagnosis not present

## 2018-06-26 DIAGNOSIS — S81801D Unspecified open wound, right lower leg, subsequent encounter: Secondary | ICD-10-CM | POA: Diagnosis not present

## 2018-06-26 DIAGNOSIS — E11621 Type 2 diabetes mellitus with foot ulcer: Secondary | ICD-10-CM | POA: Diagnosis not present

## 2018-06-26 DIAGNOSIS — L97511 Non-pressure chronic ulcer of other part of right foot limited to breakdown of skin: Secondary | ICD-10-CM | POA: Diagnosis not present

## 2018-06-30 DIAGNOSIS — S81801D Unspecified open wound, right lower leg, subsequent encounter: Secondary | ICD-10-CM | POA: Diagnosis not present

## 2018-06-30 DIAGNOSIS — I482 Chronic atrial fibrillation: Secondary | ICD-10-CM | POA: Diagnosis not present

## 2018-06-30 DIAGNOSIS — G9341 Metabolic encephalopathy: Secondary | ICD-10-CM | POA: Diagnosis not present

## 2018-06-30 DIAGNOSIS — L97511 Non-pressure chronic ulcer of other part of right foot limited to breakdown of skin: Secondary | ICD-10-CM | POA: Diagnosis not present

## 2018-06-30 DIAGNOSIS — E11621 Type 2 diabetes mellitus with foot ulcer: Secondary | ICD-10-CM | POA: Diagnosis not present

## 2018-06-30 DIAGNOSIS — F0390 Unspecified dementia without behavioral disturbance: Secondary | ICD-10-CM | POA: Diagnosis not present

## 2018-07-03 DIAGNOSIS — F0390 Unspecified dementia without behavioral disturbance: Secondary | ICD-10-CM | POA: Diagnosis not present

## 2018-07-03 DIAGNOSIS — E11621 Type 2 diabetes mellitus with foot ulcer: Secondary | ICD-10-CM | POA: Diagnosis not present

## 2018-07-03 DIAGNOSIS — G9341 Metabolic encephalopathy: Secondary | ICD-10-CM | POA: Diagnosis not present

## 2018-07-03 DIAGNOSIS — I482 Chronic atrial fibrillation: Secondary | ICD-10-CM | POA: Diagnosis not present

## 2018-07-03 DIAGNOSIS — L97511 Non-pressure chronic ulcer of other part of right foot limited to breakdown of skin: Secondary | ICD-10-CM | POA: Diagnosis not present

## 2018-07-03 DIAGNOSIS — S81801D Unspecified open wound, right lower leg, subsequent encounter: Secondary | ICD-10-CM | POA: Diagnosis not present

## 2018-07-04 DIAGNOSIS — N390 Urinary tract infection, site not specified: Secondary | ICD-10-CM | POA: Diagnosis not present

## 2018-07-04 DIAGNOSIS — R3915 Urgency of urination: Secondary | ICD-10-CM | POA: Diagnosis not present

## 2018-07-07 ENCOUNTER — Encounter (HOSPITAL_BASED_OUTPATIENT_CLINIC_OR_DEPARTMENT_OTHER): Payer: Medicare Other | Attending: Internal Medicine

## 2018-07-07 DIAGNOSIS — L97812 Non-pressure chronic ulcer of other part of right lower leg with fat layer exposed: Secondary | ICD-10-CM | POA: Insufficient documentation

## 2018-07-07 DIAGNOSIS — E1142 Type 2 diabetes mellitus with diabetic polyneuropathy: Secondary | ICD-10-CM | POA: Insufficient documentation

## 2018-07-07 DIAGNOSIS — L97512 Non-pressure chronic ulcer of other part of right foot with fat layer exposed: Secondary | ICD-10-CM | POA: Diagnosis not present

## 2018-07-07 DIAGNOSIS — E11622 Type 2 diabetes mellitus with other skin ulcer: Secondary | ICD-10-CM | POA: Insufficient documentation

## 2018-07-07 DIAGNOSIS — I10 Essential (primary) hypertension: Secondary | ICD-10-CM | POA: Diagnosis not present

## 2018-07-07 DIAGNOSIS — L97522 Non-pressure chronic ulcer of other part of left foot with fat layer exposed: Secondary | ICD-10-CM | POA: Diagnosis not present

## 2018-07-07 DIAGNOSIS — E11621 Type 2 diabetes mellitus with foot ulcer: Secondary | ICD-10-CM | POA: Insufficient documentation

## 2018-07-07 DIAGNOSIS — L97521 Non-pressure chronic ulcer of other part of left foot limited to breakdown of skin: Secondary | ICD-10-CM | POA: Diagnosis not present

## 2018-07-07 DIAGNOSIS — L97516 Non-pressure chronic ulcer of other part of right foot with bone involvement without evidence of necrosis: Secondary | ICD-10-CM | POA: Diagnosis not present

## 2018-07-09 DIAGNOSIS — E1142 Type 2 diabetes mellitus with diabetic polyneuropathy: Secondary | ICD-10-CM | POA: Diagnosis not present

## 2018-07-09 DIAGNOSIS — F0390 Unspecified dementia without behavioral disturbance: Secondary | ICD-10-CM | POA: Diagnosis not present

## 2018-07-09 DIAGNOSIS — E11621 Type 2 diabetes mellitus with foot ulcer: Secondary | ICD-10-CM | POA: Diagnosis not present

## 2018-07-09 DIAGNOSIS — L97821 Non-pressure chronic ulcer of other part of left lower leg limited to breakdown of skin: Secondary | ICD-10-CM | POA: Diagnosis not present

## 2018-07-09 DIAGNOSIS — L97511 Non-pressure chronic ulcer of other part of right foot limited to breakdown of skin: Secondary | ICD-10-CM | POA: Diagnosis not present

## 2018-07-09 DIAGNOSIS — I482 Chronic atrial fibrillation, unspecified: Secondary | ICD-10-CM | POA: Diagnosis not present

## 2018-07-09 DIAGNOSIS — Z7984 Long term (current) use of oral hypoglycemic drugs: Secondary | ICD-10-CM | POA: Diagnosis not present

## 2018-07-09 DIAGNOSIS — M069 Rheumatoid arthritis, unspecified: Secondary | ICD-10-CM | POA: Diagnosis not present

## 2018-07-09 DIAGNOSIS — L97521 Non-pressure chronic ulcer of other part of left foot limited to breakdown of skin: Secondary | ICD-10-CM | POA: Diagnosis not present

## 2018-07-10 DIAGNOSIS — R3915 Urgency of urination: Secondary | ICD-10-CM | POA: Diagnosis not present

## 2018-07-10 DIAGNOSIS — N390 Urinary tract infection, site not specified: Secondary | ICD-10-CM | POA: Diagnosis not present

## 2018-07-11 DIAGNOSIS — L97521 Non-pressure chronic ulcer of other part of left foot limited to breakdown of skin: Secondary | ICD-10-CM | POA: Diagnosis not present

## 2018-07-11 DIAGNOSIS — E1142 Type 2 diabetes mellitus with diabetic polyneuropathy: Secondary | ICD-10-CM | POA: Diagnosis not present

## 2018-07-11 DIAGNOSIS — L97511 Non-pressure chronic ulcer of other part of right foot limited to breakdown of skin: Secondary | ICD-10-CM | POA: Diagnosis not present

## 2018-07-11 DIAGNOSIS — L97821 Non-pressure chronic ulcer of other part of left lower leg limited to breakdown of skin: Secondary | ICD-10-CM | POA: Diagnosis not present

## 2018-07-11 DIAGNOSIS — E11621 Type 2 diabetes mellitus with foot ulcer: Secondary | ICD-10-CM | POA: Diagnosis not present

## 2018-07-11 DIAGNOSIS — M069 Rheumatoid arthritis, unspecified: Secondary | ICD-10-CM | POA: Diagnosis not present

## 2018-07-14 DIAGNOSIS — L97511 Non-pressure chronic ulcer of other part of right foot limited to breakdown of skin: Secondary | ICD-10-CM | POA: Diagnosis not present

## 2018-07-14 DIAGNOSIS — M069 Rheumatoid arthritis, unspecified: Secondary | ICD-10-CM | POA: Diagnosis not present

## 2018-07-14 DIAGNOSIS — E1142 Type 2 diabetes mellitus with diabetic polyneuropathy: Secondary | ICD-10-CM | POA: Diagnosis not present

## 2018-07-14 DIAGNOSIS — E11621 Type 2 diabetes mellitus with foot ulcer: Secondary | ICD-10-CM | POA: Diagnosis not present

## 2018-07-14 DIAGNOSIS — L97521 Non-pressure chronic ulcer of other part of left foot limited to breakdown of skin: Secondary | ICD-10-CM | POA: Diagnosis not present

## 2018-07-14 DIAGNOSIS — L97821 Non-pressure chronic ulcer of other part of left lower leg limited to breakdown of skin: Secondary | ICD-10-CM | POA: Diagnosis not present

## 2018-07-15 DIAGNOSIS — Z79899 Other long term (current) drug therapy: Secondary | ICD-10-CM | POA: Diagnosis not present

## 2018-07-15 DIAGNOSIS — M79643 Pain in unspecified hand: Secondary | ICD-10-CM | POA: Diagnosis not present

## 2018-07-15 DIAGNOSIS — D649 Anemia, unspecified: Secondary | ICD-10-CM | POA: Diagnosis not present

## 2018-07-15 DIAGNOSIS — M0579 Rheumatoid arthritis with rheumatoid factor of multiple sites without organ or systems involvement: Secondary | ICD-10-CM | POA: Diagnosis not present

## 2018-07-15 DIAGNOSIS — M810 Age-related osteoporosis without current pathological fracture: Secondary | ICD-10-CM | POA: Diagnosis not present

## 2018-07-16 DIAGNOSIS — E11621 Type 2 diabetes mellitus with foot ulcer: Secondary | ICD-10-CM | POA: Diagnosis not present

## 2018-07-16 DIAGNOSIS — L97821 Non-pressure chronic ulcer of other part of left lower leg limited to breakdown of skin: Secondary | ICD-10-CM | POA: Diagnosis not present

## 2018-07-16 DIAGNOSIS — L97521 Non-pressure chronic ulcer of other part of left foot limited to breakdown of skin: Secondary | ICD-10-CM | POA: Diagnosis not present

## 2018-07-16 DIAGNOSIS — M069 Rheumatoid arthritis, unspecified: Secondary | ICD-10-CM | POA: Diagnosis not present

## 2018-07-16 DIAGNOSIS — L97511 Non-pressure chronic ulcer of other part of right foot limited to breakdown of skin: Secondary | ICD-10-CM | POA: Diagnosis not present

## 2018-07-16 DIAGNOSIS — E1142 Type 2 diabetes mellitus with diabetic polyneuropathy: Secondary | ICD-10-CM | POA: Diagnosis not present

## 2018-07-18 ENCOUNTER — Other Ambulatory Visit (HOSPITAL_BASED_OUTPATIENT_CLINIC_OR_DEPARTMENT_OTHER): Payer: Self-pay | Admitting: Internal Medicine

## 2018-07-18 ENCOUNTER — Other Ambulatory Visit (HOSPITAL_COMMUNITY)
Admission: RE | Admit: 2018-07-18 | Discharge: 2018-07-18 | Disposition: A | Discharge status: Home / Self Care | Payer: Medicare Other | Source: Other Acute Inpatient Hospital | Attending: Internal Medicine | Admitting: Internal Medicine

## 2018-07-18 DIAGNOSIS — E11621 Type 2 diabetes mellitus with foot ulcer: Secondary | ICD-10-CM | POA: Insufficient documentation

## 2018-07-18 DIAGNOSIS — E11622 Type 2 diabetes mellitus with other skin ulcer: Secondary | ICD-10-CM | POA: Diagnosis not present

## 2018-07-18 DIAGNOSIS — L97516 Non-pressure chronic ulcer of other part of right foot with bone involvement without evidence of necrosis: Secondary | ICD-10-CM | POA: Diagnosis not present

## 2018-07-18 DIAGNOSIS — L97812 Non-pressure chronic ulcer of other part of right lower leg with fat layer exposed: Secondary | ICD-10-CM | POA: Diagnosis not present

## 2018-07-18 DIAGNOSIS — M7989 Other specified soft tissue disorders: Secondary | ICD-10-CM | POA: Diagnosis not present

## 2018-07-18 DIAGNOSIS — E1142 Type 2 diabetes mellitus with diabetic polyneuropathy: Secondary | ICD-10-CM | POA: Diagnosis not present

## 2018-07-18 DIAGNOSIS — L089 Local infection of the skin and subcutaneous tissue, unspecified: Secondary | ICD-10-CM | POA: Diagnosis not present

## 2018-07-18 DIAGNOSIS — L97522 Non-pressure chronic ulcer of other part of left foot with fat layer exposed: Secondary | ICD-10-CM | POA: Diagnosis not present

## 2018-07-18 DIAGNOSIS — L97514 Non-pressure chronic ulcer of other part of right foot with necrosis of bone: Secondary | ICD-10-CM | POA: Diagnosis not present

## 2018-07-21 DIAGNOSIS — L97821 Non-pressure chronic ulcer of other part of left lower leg limited to breakdown of skin: Secondary | ICD-10-CM | POA: Diagnosis not present

## 2018-07-21 DIAGNOSIS — M069 Rheumatoid arthritis, unspecified: Secondary | ICD-10-CM | POA: Diagnosis not present

## 2018-07-21 DIAGNOSIS — E11621 Type 2 diabetes mellitus with foot ulcer: Secondary | ICD-10-CM | POA: Diagnosis not present

## 2018-07-21 DIAGNOSIS — L97521 Non-pressure chronic ulcer of other part of left foot limited to breakdown of skin: Secondary | ICD-10-CM | POA: Diagnosis not present

## 2018-07-21 DIAGNOSIS — E1142 Type 2 diabetes mellitus with diabetic polyneuropathy: Secondary | ICD-10-CM | POA: Diagnosis not present

## 2018-07-21 DIAGNOSIS — L97511 Non-pressure chronic ulcer of other part of right foot limited to breakdown of skin: Secondary | ICD-10-CM | POA: Diagnosis not present

## 2018-07-23 DIAGNOSIS — L97821 Non-pressure chronic ulcer of other part of left lower leg limited to breakdown of skin: Secondary | ICD-10-CM | POA: Diagnosis not present

## 2018-07-23 DIAGNOSIS — L97521 Non-pressure chronic ulcer of other part of left foot limited to breakdown of skin: Secondary | ICD-10-CM | POA: Diagnosis not present

## 2018-07-23 DIAGNOSIS — M069 Rheumatoid arthritis, unspecified: Secondary | ICD-10-CM | POA: Diagnosis not present

## 2018-07-23 DIAGNOSIS — L97511 Non-pressure chronic ulcer of other part of right foot limited to breakdown of skin: Secondary | ICD-10-CM | POA: Diagnosis not present

## 2018-07-23 DIAGNOSIS — E1142 Type 2 diabetes mellitus with diabetic polyneuropathy: Secondary | ICD-10-CM | POA: Diagnosis not present

## 2018-07-23 DIAGNOSIS — E11621 Type 2 diabetes mellitus with foot ulcer: Secondary | ICD-10-CM | POA: Diagnosis not present

## 2018-07-23 LAB — AEROBIC/ANAEROBIC CULTURE W GRAM STAIN (SURGICAL/DEEP WOUND)

## 2018-07-23 LAB — AEROBIC/ANAEROBIC CULTURE (SURGICAL/DEEP WOUND)

## 2018-07-25 ENCOUNTER — Encounter (HOSPITAL_BASED_OUTPATIENT_CLINIC_OR_DEPARTMENT_OTHER): Payer: Medicare Other | Attending: Internal Medicine

## 2018-07-25 DIAGNOSIS — L97821 Non-pressure chronic ulcer of other part of left lower leg limited to breakdown of skin: Secondary | ICD-10-CM | POA: Diagnosis not present

## 2018-07-25 DIAGNOSIS — E11621 Type 2 diabetes mellitus with foot ulcer: Secondary | ICD-10-CM | POA: Insufficient documentation

## 2018-07-25 DIAGNOSIS — E1142 Type 2 diabetes mellitus with diabetic polyneuropathy: Secondary | ICD-10-CM | POA: Insufficient documentation

## 2018-07-25 DIAGNOSIS — L97521 Non-pressure chronic ulcer of other part of left foot limited to breakdown of skin: Secondary | ICD-10-CM | POA: Diagnosis not present

## 2018-07-25 DIAGNOSIS — M069 Rheumatoid arthritis, unspecified: Secondary | ICD-10-CM | POA: Diagnosis not present

## 2018-07-25 DIAGNOSIS — I10 Essential (primary) hypertension: Secondary | ICD-10-CM | POA: Insufficient documentation

## 2018-07-25 DIAGNOSIS — L97512 Non-pressure chronic ulcer of other part of right foot with fat layer exposed: Secondary | ICD-10-CM | POA: Insufficient documentation

## 2018-07-25 DIAGNOSIS — L97511 Non-pressure chronic ulcer of other part of right foot limited to breakdown of skin: Secondary | ICD-10-CM | POA: Diagnosis not present

## 2018-07-28 DIAGNOSIS — L97511 Non-pressure chronic ulcer of other part of right foot limited to breakdown of skin: Secondary | ICD-10-CM | POA: Diagnosis not present

## 2018-07-28 DIAGNOSIS — L97821 Non-pressure chronic ulcer of other part of left lower leg limited to breakdown of skin: Secondary | ICD-10-CM | POA: Diagnosis not present

## 2018-07-28 DIAGNOSIS — E1142 Type 2 diabetes mellitus with diabetic polyneuropathy: Secondary | ICD-10-CM | POA: Diagnosis not present

## 2018-07-28 DIAGNOSIS — M069 Rheumatoid arthritis, unspecified: Secondary | ICD-10-CM | POA: Diagnosis not present

## 2018-07-28 DIAGNOSIS — L97521 Non-pressure chronic ulcer of other part of left foot limited to breakdown of skin: Secondary | ICD-10-CM | POA: Diagnosis not present

## 2018-07-28 DIAGNOSIS — E11621 Type 2 diabetes mellitus with foot ulcer: Secondary | ICD-10-CM | POA: Diagnosis not present

## 2018-07-30 DIAGNOSIS — M069 Rheumatoid arthritis, unspecified: Secondary | ICD-10-CM | POA: Diagnosis not present

## 2018-07-30 DIAGNOSIS — E11621 Type 2 diabetes mellitus with foot ulcer: Secondary | ICD-10-CM | POA: Diagnosis not present

## 2018-07-30 DIAGNOSIS — L97521 Non-pressure chronic ulcer of other part of left foot limited to breakdown of skin: Secondary | ICD-10-CM | POA: Diagnosis not present

## 2018-07-30 DIAGNOSIS — L97511 Non-pressure chronic ulcer of other part of right foot limited to breakdown of skin: Secondary | ICD-10-CM | POA: Diagnosis not present

## 2018-07-30 DIAGNOSIS — E1142 Type 2 diabetes mellitus with diabetic polyneuropathy: Secondary | ICD-10-CM | POA: Diagnosis not present

## 2018-07-30 DIAGNOSIS — L97821 Non-pressure chronic ulcer of other part of left lower leg limited to breakdown of skin: Secondary | ICD-10-CM | POA: Diagnosis not present

## 2018-07-31 ENCOUNTER — Encounter (HOSPITAL_BASED_OUTPATIENT_CLINIC_OR_DEPARTMENT_OTHER): Payer: Medicare Other | Attending: Internal Medicine

## 2018-07-31 DIAGNOSIS — I10 Essential (primary) hypertension: Secondary | ICD-10-CM | POA: Diagnosis not present

## 2018-07-31 DIAGNOSIS — S91302A Unspecified open wound, left foot, initial encounter: Secondary | ICD-10-CM | POA: Diagnosis not present

## 2018-07-31 DIAGNOSIS — S91104A Unspecified open wound of right lesser toe(s) without damage to nail, initial encounter: Secondary | ICD-10-CM | POA: Diagnosis not present

## 2018-07-31 DIAGNOSIS — M069 Rheumatoid arthritis, unspecified: Secondary | ICD-10-CM | POA: Diagnosis not present

## 2018-07-31 DIAGNOSIS — S81801A Unspecified open wound, right lower leg, initial encounter: Secondary | ICD-10-CM | POA: Diagnosis not present

## 2018-07-31 DIAGNOSIS — B9562 Methicillin resistant Staphylococcus aureus infection as the cause of diseases classified elsewhere: Secondary | ICD-10-CM | POA: Diagnosis not present

## 2018-07-31 DIAGNOSIS — E1142 Type 2 diabetes mellitus with diabetic polyneuropathy: Secondary | ICD-10-CM | POA: Diagnosis not present

## 2018-07-31 DIAGNOSIS — E11621 Type 2 diabetes mellitus with foot ulcer: Secondary | ICD-10-CM | POA: Diagnosis not present

## 2018-07-31 DIAGNOSIS — L97512 Non-pressure chronic ulcer of other part of right foot with fat layer exposed: Secondary | ICD-10-CM | POA: Diagnosis not present

## 2018-08-04 DIAGNOSIS — L97511 Non-pressure chronic ulcer of other part of right foot limited to breakdown of skin: Secondary | ICD-10-CM | POA: Diagnosis not present

## 2018-08-04 DIAGNOSIS — E1142 Type 2 diabetes mellitus with diabetic polyneuropathy: Secondary | ICD-10-CM | POA: Diagnosis not present

## 2018-08-04 DIAGNOSIS — M069 Rheumatoid arthritis, unspecified: Secondary | ICD-10-CM | POA: Diagnosis not present

## 2018-08-04 DIAGNOSIS — L97521 Non-pressure chronic ulcer of other part of left foot limited to breakdown of skin: Secondary | ICD-10-CM | POA: Diagnosis not present

## 2018-08-04 DIAGNOSIS — L97821 Non-pressure chronic ulcer of other part of left lower leg limited to breakdown of skin: Secondary | ICD-10-CM | POA: Diagnosis not present

## 2018-08-04 DIAGNOSIS — E11621 Type 2 diabetes mellitus with foot ulcer: Secondary | ICD-10-CM | POA: Diagnosis not present

## 2018-08-06 DIAGNOSIS — Z87440 Personal history of urinary (tract) infections: Secondary | ICD-10-CM | POA: Diagnosis not present

## 2018-08-06 DIAGNOSIS — E1149 Type 2 diabetes mellitus with other diabetic neurological complication: Secondary | ICD-10-CM | POA: Diagnosis not present

## 2018-08-06 DIAGNOSIS — E46 Unspecified protein-calorie malnutrition: Secondary | ICD-10-CM | POA: Diagnosis not present

## 2018-08-06 DIAGNOSIS — E039 Hypothyroidism, unspecified: Secondary | ICD-10-CM | POA: Diagnosis not present

## 2018-08-08 ENCOUNTER — Inpatient Hospital Stay (HOSPITAL_COMMUNITY)
Admission: EM | Admit: 2018-08-08 | Discharge: 2018-08-14 | DRG: 378 | Disposition: A | Discharge status: Home Health | Payer: Medicare Other | Attending: Family Medicine | Admitting: Family Medicine

## 2018-08-08 ENCOUNTER — Emergency Department (HOSPITAL_COMMUNITY): Payer: Medicare Other

## 2018-08-08 ENCOUNTER — Encounter (HOSPITAL_COMMUNITY): Payer: Self-pay

## 2018-08-08 DIAGNOSIS — E785 Hyperlipidemia, unspecified: Secondary | ICD-10-CM | POA: Diagnosis present

## 2018-08-08 DIAGNOSIS — L97519 Non-pressure chronic ulcer of other part of right foot with unspecified severity: Secondary | ICD-10-CM | POA: Diagnosis present

## 2018-08-08 DIAGNOSIS — J9811 Atelectasis: Secondary | ICD-10-CM | POA: Diagnosis not present

## 2018-08-08 DIAGNOSIS — D62 Acute posthemorrhagic anemia: Secondary | ICD-10-CM | POA: Diagnosis not present

## 2018-08-08 DIAGNOSIS — Z87440 Personal history of urinary (tract) infections: Secondary | ICD-10-CM | POA: Diagnosis not present

## 2018-08-08 DIAGNOSIS — R531 Weakness: Secondary | ICD-10-CM | POA: Diagnosis not present

## 2018-08-08 DIAGNOSIS — I10 Essential (primary) hypertension: Secondary | ICD-10-CM | POA: Diagnosis present

## 2018-08-08 DIAGNOSIS — E119 Type 2 diabetes mellitus without complications: Secondary | ICD-10-CM

## 2018-08-08 DIAGNOSIS — M069 Rheumatoid arthritis, unspecified: Secondary | ICD-10-CM | POA: Diagnosis present

## 2018-08-08 DIAGNOSIS — R410 Disorientation, unspecified: Secondary | ICD-10-CM | POA: Diagnosis not present

## 2018-08-08 DIAGNOSIS — Z7952 Long term (current) use of systemic steroids: Secondary | ICD-10-CM | POA: Diagnosis not present

## 2018-08-08 DIAGNOSIS — K921 Melena: Secondary | ICD-10-CM | POA: Diagnosis present

## 2018-08-08 DIAGNOSIS — E039 Hypothyroidism, unspecified: Secondary | ICD-10-CM | POA: Diagnosis present

## 2018-08-08 DIAGNOSIS — E876 Hypokalemia: Secondary | ICD-10-CM | POA: Diagnosis not present

## 2018-08-08 DIAGNOSIS — Z79890 Hormone replacement therapy (postmenopausal): Secondary | ICD-10-CM | POA: Diagnosis not present

## 2018-08-08 DIAGNOSIS — F0390 Unspecified dementia without behavioral disturbance: Secondary | ICD-10-CM | POA: Diagnosis present

## 2018-08-08 DIAGNOSIS — Z7401 Bed confinement status: Secondary | ICD-10-CM | POA: Diagnosis not present

## 2018-08-08 DIAGNOSIS — Z23 Encounter for immunization: Secondary | ICD-10-CM | POA: Diagnosis not present

## 2018-08-08 DIAGNOSIS — Z8719 Personal history of other diseases of the digestive system: Secondary | ICD-10-CM

## 2018-08-08 DIAGNOSIS — K219 Gastro-esophageal reflux disease without esophagitis: Secondary | ICD-10-CM | POA: Diagnosis present

## 2018-08-08 DIAGNOSIS — R4182 Altered mental status, unspecified: Secondary | ICD-10-CM | POA: Diagnosis not present

## 2018-08-08 DIAGNOSIS — K922 Gastrointestinal hemorrhage, unspecified: Secondary | ICD-10-CM | POA: Diagnosis present

## 2018-08-08 DIAGNOSIS — E11621 Type 2 diabetes mellitus with foot ulcer: Secondary | ICD-10-CM | POA: Diagnosis present

## 2018-08-08 DIAGNOSIS — I4891 Unspecified atrial fibrillation: Secondary | ICD-10-CM | POA: Diagnosis not present

## 2018-08-08 DIAGNOSIS — I482 Chronic atrial fibrillation, unspecified: Secondary | ICD-10-CM | POA: Diagnosis present

## 2018-08-08 DIAGNOSIS — I959 Hypotension, unspecified: Secondary | ICD-10-CM | POA: Diagnosis not present

## 2018-08-08 DIAGNOSIS — Z7984 Long term (current) use of oral hypoglycemic drugs: Secondary | ICD-10-CM

## 2018-08-08 DIAGNOSIS — Z800 Family history of malignant neoplasm of digestive organs: Secondary | ICD-10-CM | POA: Diagnosis not present

## 2018-08-08 DIAGNOSIS — M2550 Pain in unspecified joint: Secondary | ICD-10-CM | POA: Diagnosis not present

## 2018-08-08 LAB — ETHANOL: Alcohol, Ethyl (B): <10 mg/dL (ref <10)

## 2018-08-08 LAB — I-STAT TROPONIN, ED: Troponin i, poc: 0.03 ng/mL (ref 0.00–0.08)

## 2018-08-08 LAB — URINALYSIS, ROUTINE W REFLEX MICROSCOPIC
BILIRUBIN URINE: NEGATIVE
Bacteria, UA: NONE SEEN
Glucose, UA: NEGATIVE mg/dL
KETONES UR: NEGATIVE mg/dL
Nitrite: NEGATIVE
PROTEIN: NEGATIVE mg/dL
Specific Gravity, Urine: 1.017 (ref 1.005–1.030)
pH: 5 (ref 5.0–8.0)

## 2018-08-08 LAB — CBC
HEMATOCRIT: 34.5 % — AB (ref 36.0–46.0)
HEMATOCRIT: 34.9 % — AB (ref 36.0–46.0)
HEMOGLOBIN: 10.5 g/dL — AB (ref 12.0–15.0)
Hemoglobin: 10.3 g/dL — ABNORMAL LOW (ref 12.0–15.0)
MCH: 26.5 pg (ref 26.0–34.0)
MCH: 26.3 pg (ref 26.0–34.0)
MCHC: 30.4 g/dL (ref 30.0–36.0)
MCHC: 29.5 g/dL — AB (ref 30.0–36.0)
MCV: 87.1 fL (ref 80.0–100.0)
MCV: 89 fL (ref 80.0–100.0)
NRBC: 0 % (ref 0.0–0.2)
Platelets: 291 10*3/uL (ref 150–400)
Platelets: 306 10*3/uL (ref 150–400)
RBC: 3.96 MIL/uL (ref 3.87–5.11)
RBC: 3.92 MIL/uL (ref 3.87–5.11)
RDW: 15.6 % — ABNORMAL HIGH (ref 11.5–15.5)
RDW: 15.9 % — AB (ref 11.5–15.5)
WBC: 8.6 10*3/uL (ref 4.0–10.5)
WBC: 8.1 10*3/uL (ref 4.0–10.5)
nRBC: 0 % (ref 0.0–0.2)

## 2018-08-08 LAB — COMPREHENSIVE METABOLIC PANEL
ALBUMIN: 3.1 g/dL — AB (ref 3.5–5.0)
ALK PHOS: 61 U/L (ref 38–126)
ALT: 14 U/L (ref 0–44)
ANION GAP: 9 (ref 5–15)
AST: 24 U/L (ref 15–41)
BUN: 21 mg/dL (ref 8–23)
CALCIUM: 8.8 mg/dL — AB (ref 8.9–10.3)
CO2: 26 mmol/L (ref 22–32)
Chloride: 104 mmol/L (ref 98–111)
Creatinine, Ser: 0.73 mg/dL (ref 0.44–1.00)
GFR calc Af Amer: >60 mL/min (ref >60)
GFR calc non Af Amer: >60 mL/min (ref >60)
GLUCOSE: 173 mg/dL — AB (ref 70–99)
Potassium: 3.8 mmol/L (ref 3.5–5.1)
SODIUM: 139 mmol/L (ref 135–145)
Total Bilirubin: 0.7 mg/dL (ref 0.3–1.2)
Total Protein: 6.1 g/dL — ABNORMAL LOW (ref 6.5–8.1)

## 2018-08-08 LAB — DIFFERENTIAL
ABS IMMATURE GRANULOCYTES: 0.05 10*3/uL (ref 0.00–0.07)
Basophils Absolute: 0 10*3/uL (ref 0.0–0.1)
Basophils Relative: 1 %
Eosinophils Absolute: 0.1 10*3/uL (ref 0.0–0.5)
Eosinophils Relative: 1 %
IMMATURE GRANULOCYTES: 1 %
LYMPHS PCT: 16 %
Lymphs Abs: 1.4 10*3/uL (ref 0.7–4.0)
Monocytes Absolute: 0.8 10*3/uL (ref 0.1–1.0)
Monocytes Relative: 9 %
NEUTROS ABS: 6.2 10*3/uL (ref 1.7–7.7)
Neutrophils Relative %: 72 %

## 2018-08-08 LAB — HEMOGLOBIN AND HEMATOCRIT, BLOOD
HCT: 33.2 % — ABNORMAL LOW (ref 36.0–46.0)
HEMOGLOBIN: 9.7 g/dL — AB (ref 12.0–15.0)

## 2018-08-08 LAB — CBG MONITORING, ED: Glucose-Capillary: 166 mg/dL — ABNORMAL HIGH (ref 70–99)

## 2018-08-08 MED ORDER — LEVOTHYROXINE SODIUM 100 MCG PO TABS
100.0000 ug | ORAL_TABLET | Freq: Every day | ORAL | Status: DC
  Administered 2018-08-10 – 2018-08-14 (×5): 100 ug via ORAL
  Filled 2018-08-08 (×6): qty 1

## 2018-08-08 MED ORDER — PREDNISONE 1 MG PO TABS
4.0000 mg | ORAL_TABLET | Freq: Every day | ORAL | Status: DC
  Filled 2018-08-08: qty 4

## 2018-08-08 MED ORDER — ONDANSETRON HCL 4 MG/2ML IJ SOLN: 4.0000 mg | Freq: Four times a day (QID) | INTRAMUSCULAR | Status: DC | PRN

## 2018-08-08 MED ORDER — SODIUM CHLORIDE 0.9 % IV BOLUS
500.0000 mL | Freq: Once | INTRAVENOUS | Status: AC
  Administered 2018-08-08: 500 mL via INTRAVENOUS

## 2018-08-08 MED ORDER — SODIUM CHLORIDE 0.9 % IV SOLN
INTRAVENOUS | Status: DC
  Administered 2018-08-08 – 2018-08-14 (×8): via INTRAVENOUS

## 2018-08-08 MED ORDER — INSULIN ASPART 100 UNIT/ML ~~LOC~~ SOLN
0.0000 [IU] | Freq: Three times a day (TID) | SUBCUTANEOUS | Status: DC
  Administered 2018-08-09: 2 [IU] via SUBCUTANEOUS
  Administered 2018-08-11 – 2018-08-12 (×2): 1 [IU] via SUBCUTANEOUS
  Administered 2018-08-12: 2 [IU] via SUBCUTANEOUS
  Administered 2018-08-12: 3 [IU] via SUBCUTANEOUS
  Administered 2018-08-13 (×3): 2 [IU] via SUBCUTANEOUS
  Administered 2018-08-14: 1 [IU] via SUBCUTANEOUS
  Administered 2018-08-14 (×2): 2 [IU] via SUBCUTANEOUS

## 2018-08-08 MED ORDER — INSULIN ASPART 100 UNIT/ML ~~LOC~~ SOLN
0.0000 [IU] | Freq: Every day | SUBCUTANEOUS | Status: DC
  Administered 2018-08-13: 2 [IU] via SUBCUTANEOUS

## 2018-08-08 MED ORDER — MIRABEGRON ER 25 MG PO TB24
25.0000 mg | ORAL_TABLET | Freq: Every day | ORAL | Status: DC
  Administered 2018-08-08 – 2018-08-14 (×5): 25 mg via ORAL
  Filled 2018-08-08 (×7): qty 1

## 2018-08-08 MED ORDER — PRAVASTATIN SODIUM 20 MG PO TABS
20.0000 mg | ORAL_TABLET | Freq: Every day | ORAL | Status: DC
  Administered 2018-08-09 – 2018-08-13 (×4): 20 mg via ORAL
  Filled 2018-08-08 (×5): qty 1

## 2018-08-08 MED ORDER — DICLOFENAC SODIUM 1 % TD GEL
4.0000 g | Freq: Four times a day (QID) | TRANSDERMAL | Status: DC
  Administered 2018-08-08 – 2018-08-14 (×22): 4 g via TOPICAL
  Filled 2018-08-08: qty 100

## 2018-08-08 MED ORDER — LEFLUNOMIDE 20 MG PO TABS
20.0000 mg | ORAL_TABLET | Freq: Every day | ORAL | Status: DC
  Administered 2018-08-08 – 2018-08-14 (×6): 20 mg via ORAL
  Filled 2018-08-08 (×7): qty 1

## 2018-08-08 MED ORDER — ONDANSETRON HCL 4 MG PO TABS: 4.0000 mg | ORAL_TABLET | Freq: Four times a day (QID) | ORAL | Status: DC | PRN

## 2018-08-08 MED ORDER — BENZONATATE 100 MG PO CAPS: 200.0000 mg | ORAL_CAPSULE | Freq: Three times a day (TID) | ORAL | Status: DC | PRN

## 2018-08-08 MED ORDER — MELATONIN 2.5 MG PO CAPS: 2.5000 mg | ORAL_CAPSULE | Freq: Every evening | ORAL | Status: DC | PRN

## 2018-08-08 MED ORDER — SODIUM CHLORIDE 0.9 % IV SOLN
8.0000 mg/h | INTRAVENOUS | Status: AC
  Administered 2018-08-08 – 2018-08-11 (×7): 8 mg/h via INTRAVENOUS
  Filled 2018-08-08 (×11): qty 80

## 2018-08-08 MED ORDER — MIRTAZAPINE 7.5 MG PO TABS
7.5000 mg | ORAL_TABLET | Freq: Every day | ORAL | Status: DC
  Administered 2018-08-08 – 2018-08-13 (×6): 7.5 mg via ORAL
  Filled 2018-08-08 (×7): qty 1

## 2018-08-08 MED ORDER — MAGNESIUM OXIDE 400 (241.3 MG) MG PO TABS: 200.0000 mg | ORAL_TABLET | Freq: Two times a day (BID) | ORAL | Status: DC

## 2018-08-08 MED ORDER — ACETAMINOPHEN 325 MG PO TABS
650.0000 mg | ORAL_TABLET | Freq: Four times a day (QID) | ORAL | Status: DC | PRN
  Administered 2018-08-09 – 2018-08-13 (×4): 650 mg via ORAL
  Filled 2018-08-08 (×3): qty 2

## 2018-08-08 MED ORDER — SODIUM CHLORIDE 0.9 % IV SOLN
80.0000 mg | Freq: Once | INTRAVENOUS | Status: AC
  Administered 2018-08-08: 80 mg via INTRAVENOUS
  Filled 2018-08-08: qty 80

## 2018-08-08 MED ORDER — ACETAMINOPHEN 650 MG RE SUPP: 650.0000 mg | Freq: Four times a day (QID) | RECTAL | Status: DC | PRN

## 2018-08-08 NOTE — ED Triage Notes (Signed)
Pt. Arrived from home where she lives alone with the help of nurse aids. Nurse aids noticed that she was leaning in the right today and weaker than normal. EMS reports that pt stated she felt weak this morning but is feeling better. Pt has wounds on great toes that are positive for MRSA.

## 2018-08-08 NOTE — ED Provider Notes (Addendum)
[facility] EMERGENCY DEPARTMENT Provider Note   CSN: [id number] Arrival date & time: 08/08/18  1054     History   Chief Complaint Chief Complaint  Patient presents with  . Weakness    HPI [name] is a 84 y.o. female.  The history is provided by the patient, the EMS personnel, medical records and a caregiver. No language interpreter was used.  Weakness    [name] is a 84 y.o. female who presents to the Emergency Department complaining of weakness.  Level V caveat due to dementia/confusion. She is coming from home and has daytime and evening aids. She was last seen normal yesterday in the evening at an unknown time. This morning when her 8 AM aid came to check on her she was having trouble getting out of bed and leaning more to the right. She appeared to be more weak than general and was complaining of feeling weak. Yesterday she was sleeping more than usual. At baseline she stands and walks with a walker only with assistance and she does have some mild intermittent confusion. No reports of fevers, difficulty breathing, vomiting. Past Medical History:  Diagnosis Date  . Acute bronchitis   . Anemia, unspecified   . Atrial fibrillation ([place])    not on AC due to falls  . Depressive disorder, not elsewhere classified   . Diverticulosis of colon (without mention of hemorrhage)   . Esophageal reflux   . Family history of malignant neoplasm of gastrointestinal tract   . Functional diarrhea   . Hiatal hernia   . History of blood transfusion    "once; related to diverticulitis" (11/29/2017)  . Other and unspecified hyperlipidemia   . Other chest pain   . Other specified cardiac dysrhythmias(427.89)   . Personal history of colonic polyps 07/17/1995   adenomatous polyps  . PONV (postoperative nausea and vomiting)   . Recurrent UTI (urinary tract infection)    "2-3 times in the last 1 1/79yr" (11/29/2017)  . Rheumatoid arthritis ([place])   .  Tachycardia, unspecified   . Type II diabetes mellitus ([place])   . Unspecified adverse effect of unspecified drug, medicinal and biological substance   . Unspecified essential hypertension   . Unspecified hypothyroidism     Patient Active Problem List   Diagnosis Date Noted  . Altered mental status, unspecified 06/04/2018  . Acute encephalopathy 06/03/2018  . Atrial fibrillation, chronic 06/03/2018  . Pneumonia 04/26/2018  . TIA (transient ischemic attack) 04/25/2018  . Aphasia   . Pressure injury of skin 02/26/2018  . Hyperkalemia 02/25/2018  . Altered mental status 02/25/2018  . Acute lower UTI 02/25/2018  . Iron deficiency anemia due to chronic blood loss 12/17/2017  . Protein-calorie malnutrition, severe 11/30/2017  . Syncope 11/29/2017  . Diabetic ulcer of left foot associated with type 2 diabetes mellitus ([place]) 08/11/2015  . Closed right hip fracture ([place]) 08/28/2012  . History of gastroesophageal reflux (GERD) 06/26/2011  . Lower GI bleeding 06/26/2011  . DEPRESSION 03/31/2009  . DIARRHEA, FUNCTIONAL 03/31/2009  . DIABETIC HYPOGLYCEMIA, TYPE II 11/03/2008  . ACUTE BRONCHITIS 08/23/2008  . AV NODAL REENTRY TACHYCARDIA 10/29/2007  . TACHYCARDIA 10/29/2007  . CHEST PAIN, ATYPICAL 10/25/2007  . ADVEF, DRUG/MEDICINAL/BIOLOGICAL SUBST NOS 07/16/2007  . Nonspecific (abnormal) findings on radiological and other examination of body structure 04/03/2007  . CHEST XRAY, ABNORMAL 04/03/2007  . Hypothyroidism (acquired) 03/06/2007  . Type 2 diabetes mellitus with other specified complication ([place]) 27/78/2423  . HYPERLIPIDEMIA 03/06/2007  .  Essential hypertension 03/06/2007  . DIVERTICULOSIS, COLON 03/06/2007  . Rheumatoid arthritis involving both hands with positive rheumatoid factor ([place]) 03/06/2007    Past Surgical History:  Procedure Laterality Date  . ABDOMINAL HYSTERECTOMY     "partial"  . CATARACT EXTRACTION Bilateral   . FOREARM FRACTURE SURGERY Right 1940s?  .  FRACTURE SURGERY    . ORIF FEMUR FRACTURE  08/30/2012   Procedure: OPEN REDUCTION INTERNAL FIXATION (ORIF) DISTAL FEMUR FRACTURE;  Surgeon: Mauri Pole, MD;  Location: [facility];  Service: Orthopedics;  Laterality: Right;  proximal femur  . THYROIDECTOMY, PARTIAL       OB History   None      Home Medications    Prior to Admission medications   Medication Sig Start Date End Date Taking? Authorizing Provider  benzonatate (TESSALON) 200 MG capsule Take 200 mg by mouth 3 (three) times daily as needed for cough. 08/05/18  Yes [provider]  ferrous sulfate 220 (44 Fe) MG/5ML solution Take 440 mg by mouth daily. Take with large glass of orange juice 30 mins before breakfast.   Yes [provider]  folic acid (FOLVITE) 1 MG tablet Take 1,000 mcg by mouth daily. 07/16/18  Yes [provider]  leflunomide (ARAVA) 20 MG tablet Take 20 mg by mouth daily. 08/14/17  Yes [provider]  levothyroxine (SYNTHROID) 100 MCG tablet Take 1 tablet (100 mcg total) by mouth daily. Patient taking differently: Take 150 mcg by mouth daily.  12/07/17 12/07/18 Yes Regalado, Belkys A, MD  metFORMIN (GLUCOPHAGE-XR) 500 MG 24 hr tablet Take 500 mg by mouth See admin instructions. Take 1000 mg by mouth in the morning and 500 mg by mouth in the evening 03/07/15  Yes [provider]  mirtazapine (REMERON) 7.5 MG tablet Take 7.5 mg by mouth at bedtime. 08/05/18  Yes [provider]  Multiple Vitamins-Minerals (CENTRUM SILVER 50+WOMEN) TABS Take 1 tablet by mouth daily.   Yes [provider]  MYRBETRIQ 25 MG TB24 tablet Take 25 mg by mouth daily. 06/25/18  Yes [provider]  omeprazole (PRILOSEC) 40 MG capsule Take 1 capsule (40 mg total) by mouth 2 (two) times daily. 12/07/17  Yes Regalado, Belkys A, MD  Pitavastatin Calcium (LIVALO) 2 MG TABS Take 2 mg by mouth every Monday, Wednesday, and Friday.   Yes [provider]  predniSONE (DELTASONE)  1 MG tablet Take 4 mg by mouth daily with breakfast.    Yes [provider]  TRADJENTA 5 MG TABS tablet Take 5 mg by mouth daily. 01/29/18  Yes [provider]  VOLTAREN 1 % GEL Apply 4 g topically 4 (four) times daily. Hands and knees 11/30/13  Yes [provider]  feeding supplement, ENSURE ENLIVE, (ENSURE ENLIVE) LIQD Take 237 mLs by mouth 2 (two) times daily between meals. Patient not taking: Reported on 06/03/2018 02/26/18   Lavina Hamman, MD  magnesium oxide (MAG-OX) 400 (241.3 Mg) MG tablet Take 0.5 tablets (200 mg total) by mouth 2 (two) times daily. Patient not taking: Reported on 08/08/2018 12/07/17   Regalado, Jerald Kief A, MD  Melatonin 2.5 MG CAPS Take 1 capsule (2.5 mg total) by mouth at bedtime as needed (for sleep). Patient not taking: Reported on 08/08/2018 12/07/17   Elmarie Shiley, MD    Family History Family History  Problem Relation Age of Onset  . Colon cancer Maternal Grandmother     Social History Social History   Tobacco Use  . Smoking status: Never  Smoker  . Smokeless tobacco: Never Used  Substance Use Topics  . Alcohol use: Not Currently    Alcohol/week: 1.0 standard drinks    Types: 1 Glasses of wine per week  . Drug use: No     Allergies   Metformin and related   Review of Systems Review of Systems  Neurological: Positive for weakness.  All other systems reviewed and are negative.    Physical Exam Updated Vital Signs BP (!) 181/101   Pulse 62   Resp 19   SpO2 93%   Physical Exam  Constitutional: She appears well-developed.  Frail  HENT:  Head: Normocephalic and atraumatic.  Cardiovascular: Normal rate and regular rhythm.  No murmur heard. Pulmonary/Chest: Effort normal and breath sounds normal. No respiratory distress.  Abdominal: Soft. There is no tenderness. There is no rebound and no guarding.  Musculoskeletal: She exhibits no edema or tenderness.  Neurological: She is alert.  Oriented to hospital.  Disoriented to time in recent events. No asymmetry of facial movement. Visual fields are grossly intact. Generalized weakness. Sensation to light touch intact throughout all four extremities. Mildly confused. Slow to answer questions.  Skin: Skin is warm and dry.  Psychiatric: She has a normal mood and affect. Her behavior is normal.  Nursing note and vitals reviewed.    ED Treatments / Results  Labs (all labs ordered are listed, but only abnormal results are displayed) Labs Reviewed  CBC - Abnormal; Notable for the following components:      Result Value   Hemoglobin 10.5 (*)    HCT 34.5 (*)    RDW 15.6 (*)    All other components within normal limits  COMPREHENSIVE METABOLIC PANEL - Abnormal; Notable for the following components:   Glucose, Bld 173 (*)    Calcium 8.8 (*)    Total Protein 6.1 (*)    Albumin 3.1 (*)    All other components within normal limits  URINALYSIS, ROUTINE W REFLEX MICROSCOPIC - Abnormal; Notable for the following components:   Hgb urine dipstick MODERATE (*)    Leukocytes, UA TRACE (*)    Non Squamous Epithelial 0-5 (*)    All other components within normal limits  CBC - Abnormal; Notable for the following components:   Hemoglobin 10.3 (*)    HCT 34.9 (*)    MCHC 29.5 (*)    RDW 15.9 (*)    All other components within normal limits  CBG MONITORING, ED - Abnormal; Notable for the following components:   Glucose-Capillary 166 (*)    All other components within normal limits  ETHANOL  DIFFERENTIAL  I-STAT TROPONIN, ED  TYPE AND SCREEN    EKG EKG Interpretation  Date/Time:  Friday August 08 2018 11:01:34 EST Ventricular Rate:  73 PR Interval:    QRS Duration: 96 QT Interval:  437 QTC Calculation: 482 R Axis:   20 Text Interpretation:  Atrial fibrillation Ventricular premature complex Anterior infarct, old Confirmed by Quintella Reichert [phone]) on 08/08/2018 11:15:25 AM   Radiology Dg Chest 2 View  Result Date: 08/08/2018 CLINICAL DATA:   Weakness. EXAM: CHEST - 2 VIEW COMPARISON:  Chest x-ray 06/03/2018. FINDINGS: Mediastinum hilar structures normal. Mild bibasilar subsegmental atelectasis/scarring. No acute infiltrate. No pleural effusion or pneumothorax. Cardiomegaly with normal pulmonary vascularity. No acute bony abnormality. IMPRESSION: Cardiomegaly with normal pulmonary vascularity. Mild bibasilar subsegmental atelectasis/scarring. No acute infiltrate. Electronically Signed   By: [place]   On: 08/08/2018 12:51   Ct Head Wo Contrast  Result Date:  08/08/2018 CLINICAL DATA:  Altered mental status, weaker today, leading to the RIGHT, history atrial fibrillation, type II diabetes mellitus, hypertension EXAM: CT HEAD WITHOUT CONTRAST TECHNIQUE: Contiguous axial images were obtained from the base of the skull through the vertex without intravenous contrast. COMPARISON:  06/03/2018 FINDINGS: Brain: Generalized atrophy. Normal ventricular morphology. No midline shift or mass effect. Small vessel chronic ischemic changes of deep cerebral white matter. No intracranial hemorrhage, mass lesion, evidence of acute infarction, or extra-axial fluid collection. Vascular: Atherosclerotic calcifications of internal carotid and vertebral arteries at skull base Skull: Demineralized but intact Sinuses/Orbits: Clear Other: N/A IMPRESSION: Atrophy with small vessel chronic ischemic changes of deep cerebral white matter. No acute intracranial abnormalities. Electronically Signed   By: Lavonia Dana M.D.   On: 08/08/2018 14:19    Procedures Procedures (including critical care time) CRITICAL CARE Performed by: Quintella Reichert   Total critical care time: 35 minutes  Critical care time was exclusive of separately billable procedures and treating other patients.  Critical care was necessary to treat or prevent imminent or life-threatening deterioration.  Critical care was time spent personally by me on the following activities: development of  treatment plan with patient and/or surrogate as well as nursing, discussions with consultants, evaluation of patient's response to treatment, examination of patient, obtaining history from patient or surrogate, ordering and performing treatments and interventions, ordering and review of laboratory studies, ordering and review of radiographic studies, pulse oximetry and re-evaluation of patient's condition.  Medications Ordered in ED Medications  pantoprazole (PROTONIX) 80 mg in sodium chloride 0.9 % 250 mL (0.32 mg/mL) infusion (8 mg/hr Intravenous New Bag/Given 08/08/18 1530)  sodium chloride 0.9 % bolus 500 mL (0 mLs Intravenous Stopped 08/08/18 1629)  pantoprazole (PROTONIX) 80 mg in sodium chloride 0.9 % 100 mL IVPB (0 mg Intravenous Stopped 08/08/18 1628)     Initial Impression / Assessment and Plan / ED Course  I have reviewed the triage vital signs and the nursing notes.  Pertinent labs & imaging results that were available during my care of the patient were reviewed by me and considered in my medical decision making (see chart for details).     Patient coming from home for evaluation of feeling weak, leaning to the right starting today, increased lethargy yesterday. She is chronically ill appearing on evaluation without focal weakness. Initial hemoglobin with anemia that is similar when compared to priors. UA not consistent with UTI. Chest x-ray without evidence of pneumonia. CT brain with no acute abnormalities. On repeat assessment patient with dark bloody/maroon colored stool, reports feeling poorly. Type and screen added on as well as repeat hemoglobin. She was started on a Protonix. On record review she does have a history of diverticular bleed in the past. Medicine consulted for admission for further workup.  Final Clinical Impressions(s) / ED Diagnoses   Final diagnoses:  None    ED Discharge Orders    None       Quintella Reichert, MD 08/08/18 1655    Quintella Reichert,  MD 08/23/18 0000

## 2018-08-08 NOTE — ED Notes (Signed)
Pt brought to room to have BM, found to have rectal bleeding. ED PA made aware. Pt alert. Denies any bloody stool at home.

## 2018-08-08 NOTE — ED Notes (Signed)
Patient's personal aide Lorre Nick, CNA, [employer]) has the patient's purse.

## 2018-08-08 NOTE — H&P (Addendum)
History and Physical    DOA: 08/08/2018  PCP: Deland Pretty, MD  Patient coming from: Home  Chief Complaint: Weak and tired  HPI: [name] is a 84 y.o. female with history h/o dementia, rheumatoid arthritis on prednisone, GERD, colon polyps, GI bleed in 2014 secondary to diverticulosis, atrial fibrillation off anticoagulation due to history of GI bleed/falls, diabetes mellitus brought in after caregiver this morning noticed that patient appeared weak and was leaning to her side in the bed.  Currently her nighttime caregiver bedside and states patient was in her usual state of health yesterday when she took care of her.  In the ED patient was noted to have 2 episodes of bloody stools.  The second episode was melena while the first 1 showed dark blood mixed with stool.  Patient denies any abdominal pain or chest pain.  She is also demented and unreliable historian.  Work-up in the ED revealed stable hemoglobin around 10.  Patient not on any NSAIDs or aspirin.  Takes prednisone for history of rheumatoid arthritis.   Review of Systems: As per HPI otherwise 10 point review of systems negative.    Past Medical History:  Diagnosis Date  . Acute bronchitis   . Anemia, unspecified   . Atrial fibrillation ([place])    not on AC due to falls  . Depressive disorder, not elsewhere classified   . Diverticulosis of colon (without mention of hemorrhage)   . Esophageal reflux   . Family history of malignant neoplasm of gastrointestinal tract   . Functional diarrhea   . Hiatal hernia   . History of blood transfusion    "once; related to diverticulitis" (11/29/2017)  . Other and unspecified hyperlipidemia   . Other chest pain   . Other specified cardiac dysrhythmias(427.89)   . Personal history of colonic polyps 07/17/1995   adenomatous polyps  . PONV (postoperative nausea and vomiting)   . Recurrent UTI (urinary tract infection)    "2-3 times in the last 1 1/36yr" (11/29/2017)  . Rheumatoid  arthritis ([place])   . Tachycardia, unspecified   . Type II diabetes mellitus ([place])   . Unspecified adverse effect of unspecified drug, medicinal and biological substance   . Unspecified essential hypertension   . Unspecified hypothyroidism     Past Surgical History:  Procedure Laterality Date  . ABDOMINAL HYSTERECTOMY     "partial"  . CATARACT EXTRACTION Bilateral   . FOREARM FRACTURE SURGERY Right 1940s?  . FRACTURE SURGERY    . ORIF FEMUR FRACTURE  08/30/2012   Procedure: OPEN REDUCTION INTERNAL FIXATION (ORIF) DISTAL FEMUR FRACTURE;  Surgeon: Mauri Pole, MD;  Location: [facility];  Service: Orthopedics;  Laterality: Right;  proximal femur  . THYROIDECTOMY, PARTIAL      Social history:  reports that she has never smoked. She has never used smokeless tobacco. She reports that she drank about 1.0 standard drinks of alcohol per week. She reports that she does not use drugs.   Allergies  Allergen Reactions  . Metformin And Related Diarrhea    Family History  Problem Relation Age of Onset  . Colon cancer Maternal Grandmother       Prior to Admission medications   Medication Sig Start Date End Date Taking? Authorizing Provider  benzonatate (TESSALON) 200 MG capsule Take 200 mg by mouth 3 (three) times daily as needed for cough. 08/05/18  Yes [provider]  ferrous sulfate 220 (44 Fe) MG/5ML solution Take 440 mg by mouth daily. Take with large  glass of orange juice 30 mins before breakfast.   Yes [provider]  folic acid (FOLVITE) 1 MG tablet Take 1,000 mcg by mouth daily. 07/16/18  Yes [provider]  leflunomide (ARAVA) 20 MG tablet Take 20 mg by mouth daily. 08/14/17  Yes [provider]  levothyroxine (SYNTHROID) 100 MCG tablet Take 1 tablet (100 mcg total) by mouth daily. Patient taking differently: Take 150 mcg by mouth daily.  12/07/17 12/07/18 Yes Regalado, Belkys A, MD  metFORMIN (GLUCOPHAGE-XR) 500 MG 24 hr tablet Take 500 mg by  mouth See admin instructions. Take 1000 mg by mouth in the morning and 500 mg by mouth in the evening 03/07/15  Yes [provider]  mirtazapine (REMERON) 7.5 MG tablet Take 7.5 mg by mouth at bedtime. 08/05/18  Yes [provider]  Multiple Vitamins-Minerals (CENTRUM SILVER 50+WOMEN) TABS Take 1 tablet by mouth daily.   Yes [provider]  MYRBETRIQ 25 MG TB24 tablet Take 25 mg by mouth daily. 06/25/18  Yes [provider]  omeprazole (PRILOSEC) 40 MG capsule Take 1 capsule (40 mg total) by mouth 2 (two) times daily. 12/07/17  Yes Regalado, Belkys A, MD  Pitavastatin Calcium (LIVALO) 2 MG TABS Take 2 mg by mouth every Monday, Wednesday, and Friday.   Yes [provider]  predniSONE (DELTASONE) 1 MG tablet Take 4 mg by mouth daily with breakfast.    Yes [provider]  TRADJENTA 5 MG TABS tablet Take 5 mg by mouth daily. 01/29/18  Yes [provider]  VOLTAREN 1 % GEL Apply 4 g topically 4 (four) times daily. Hands and knees 11/30/13  Yes [provider]  feeding supplement, ENSURE ENLIVE, (ENSURE ENLIVE) LIQD Take 237 mLs by mouth 2 (two) times daily between meals. Patient not taking: Reported on 06/03/2018 02/26/18   Lavina Hamman, MD  magnesium oxide (MAG-OX) 400 (241.3 Mg) MG tablet Take 0.5 tablets (200 mg total) by mouth 2 (two) times daily. Patient not taking: Reported on 08/08/2018 12/07/17   Regalado, Jerald Kief A, MD  Melatonin 2.5 MG CAPS Take 1 capsule (2.5 mg total) by mouth at bedtime as needed (for sleep). Patient not taking: Reported on 08/08/2018 12/07/17   Elmarie Shiley, MD    Physical Exam: Vitals:   08/08/18 1730 08/08/18 1800 08/08/18 1807 08/08/18 1858  BP: 127/68 106/83  (!) 149/92  Pulse: 77 93 70 89  Resp: 17 (!) 22 (!) 21 20  Temp:    (!) 97.5 F (36.4 C)  TempSrc:    Oral  SpO2: 95% 92% 97% 97%  Weight:    43.5 kg  Height:    5\' 2"  (1.575 m)    Constitutional: Cachectic appearing, calm,  comfortable Vitals:   08/08/18 1730 08/08/18 1800 08/08/18 1807 08/08/18 1858  BP: 127/68 106/83  (!) 149/92  Pulse: 77 93 70 89  Resp: 17 (!) 22 (!) 21 20  Temp:    (!) 97.5 F (36.4 C)  TempSrc:    Oral  SpO2: 95% 92% 97% 97%  Weight:    43.5 kg  Height:    5\' 2"  (1.575 m)   Eyes: PERRL, lids and conjunctivae normal ENMT: Mucous membranes are moist. Posterior pharynx clear of any exudate or lesions.Normal dentition.  Neck: normal, supple, no masses, no thyromegaly Respiratory: clear to auscultation bilaterally, no wheezing, no crackles. Normal respiratory effort. No accessory muscle use.  Cardiovascular: Regular rate and rhythm, no murmurs / rubs / gallops. No extremity edema.  2+ pedal pulses. No carotid bruits.  Abdomen: no tenderness, no masses palpated. No hepatosplenomegaly. Bowel sounds positive.  Musculoskeletal: Chronic joint deformities in hands secondary to rheumatoid arthritis.  No clubbing / cyanosis. Neurologic: CN 2-12 grossly intact.  DTR normal. Strength 4/5 in all 4.  Psychiatric: Normal judgment and insight.  Disoriented   Labs on Admission: I have personally reviewed following labs and imaging studies  CBC: Recent Labs  Lab 08/08/18 1120 08/08/18 1528  WBC 8.6 8.1  NEUTROABS 6.2  --   HGB 10.5* 10.3*  HCT 34.5* 34.9*  MCV 87.1 89.0  PLT 291 235   Basic Metabolic Panel: Recent Labs  Lab 08/08/18 1120  NA 139  K 3.8  CL 104  CO2 26  GLUCOSE 173*  BUN 21  CREATININE 0.73  CALCIUM 8.8*   GFR: Estimated Creatinine Clearance: 34 mL/min (by C-G formula based on SCr of 0.73 mg/dL). Liver Function Tests: Recent Labs  Lab 08/08/18 1120  AST 24  ALT 14  ALKPHOS 61  BILITOT 0.7  PROT 6.1*  ALBUMIN 3.1*   No results for input(s): LIPASE, AMYLASE in the last 168 hours. No results for input(s): AMMONIA in the last 168 hours. Coagulation Profile: No results for input(s): INR, PROTIME in the last 168 hours. Cardiac Enzymes: No results for  input(s): CKTOTAL, CKMB, CKMBINDEX, TROPONINI in the last 168 hours. BNP (last 3 results) No results for input(s): PROBNP in the last 8760 hours. HbA1C: No results for input(s): HGBA1C in the last 72 hours. CBG: Recent Labs  Lab 08/08/18 1105  GLUCAP 166*   Lipid Profile: No results for input(s): CHOL, HDL, LDLCALC, TRIG, CHOLHDL, LDLDIRECT in the last 72 hours. Thyroid Function Tests: No results for input(s): TSH, T4TOTAL, FREET4, T3FREE, THYROIDAB in the last 72 hours. Anemia Panel: No results for input(s): VITAMINB12, FOLATE, FERRITIN, TIBC, IRON, RETICCTPCT in the last 72 hours. Urine analysis:    Component Value Date/Time   COLORURINE YELLOW 08/08/2018 1402   APPEARANCEUR CLEAR 08/08/2018 1402   LABSPEC 1.017 08/08/2018 1402   PHURINE 5.0 08/08/2018 1402   GLUCOSEU NEGATIVE 08/08/2018 1402   HGBUR MODERATE (A) 08/08/2018 1402   HGBUR negative 03/31/2009 1047   BILIRUBINUR NEGATIVE 08/08/2018 1402   KETONESUR NEGATIVE 08/08/2018 1402   PROTEINUR NEGATIVE 08/08/2018 1402   UROBILINOGEN 0.2 08/28/2012 0543   NITRITE NEGATIVE 08/08/2018 1402   LEUKOCYTESUR TRACE (A) 08/08/2018 1402    Radiological Exams on Admission: Dg Chest 2 View  Result Date: 08/08/2018 CLINICAL DATA:  Weakness. EXAM: CHEST - 2 VIEW COMPARISON:  Chest x-ray 06/03/2018. FINDINGS: Mediastinum hilar structures normal. Mild bibasilar subsegmental atelectasis/scarring. No acute infiltrate. No pleural effusion or pneumothorax. Cardiomegaly with normal pulmonary vascularity. No acute bony abnormality. IMPRESSION: Cardiomegaly with normal pulmonary vascularity. Mild bibasilar subsegmental atelectasis/scarring. No acute infiltrate. Electronically Signed   By: Marcello Moores  Register   On: 08/08/2018 12:51   Ct Head Wo Contrast  Result Date: 08/08/2018 CLINICAL DATA:  Altered mental status, weaker today, leading to the RIGHT, history atrial fibrillation, type II diabetes mellitus, hypertension EXAM: CT HEAD WITHOUT  CONTRAST TECHNIQUE: Contiguous axial images were obtained from the base of the skull through the vertex without intravenous contrast. COMPARISON:  06/03/2018 FINDINGS: Brain: Generalized atrophy. Normal ventricular morphology. No midline shift or mass effect. Small vessel chronic ischemic changes of deep cerebral white matter. No intracranial hemorrhage, mass lesion, evidence of acute infarction, or extra-axial fluid collection. Vascular: Atherosclerotic calcifications of internal carotid and vertebral arteries at skull base Skull: Demineralized  but intact Sinuses/Orbits: Clear Other: N/A IMPRESSION: Atrophy with small vessel chronic ischemic changes of deep cerebral white matter. No acute intracranial abnormalities. Electronically Signed   By: Lavonia Dana M.D.   On: 08/08/2018 14:19       Assessment and Plan:   1.  Upper GI bleed: Will admit with IV Protonix drip.  Monitor hemoglobin and transfuse as needed.  Son consented verbally for blood transfusion if needed.  Consulted GI and spoke with Dr. Lyndel Safe who will evaluate patient in a.m.  N.p.o. except meds/IV fluids.    2.  Diabetes mellitus: Hold oral hypoglycemics as n.p.o.  Will order sliding scale insulin  3.  Hypothyroidism: Synthroid  4.  Chronic atrial fibrillation: Resume beta-blockers if blood pressure tolerates.  Not a candidate for anticoagulation  5.  Rheumatoid arthritis: Hold prednisone for now, resume after EGD.  Watch for adrenal insufficiency  DVT prophylaxis: SCDs  Code Status: Patient previously DNR but I discussed with healthcare proxy/son Mr. [name] who requested patient to be reverted to full code.  He understands risk of rib fractures and ventilator dependency if this frail-appearing patient were to undergo resuscitative measures.  Consider palliative care evaluation for care goal discussions.  Family Communication: Discussed with caregiver and son. Health care proxy would be son Consults called: GI Admission  status:  Patient admitted as inpatient as anticipated LOS greater than 2 midnights    Guilford Shi MD [facility] Pager [phone]  If 7PM-7AM, please contact night-coverage [email or web address] Password [facility]  08/08/2018, 7:42 PM

## 2018-08-08 NOTE — ED Notes (Signed)
Placed pt on bedpan, tolerated well. 

## 2018-08-09 DIAGNOSIS — F0390 Unspecified dementia without behavioral disturbance: Secondary | ICD-10-CM

## 2018-08-09 DIAGNOSIS — D62 Acute posthemorrhagic anemia: Secondary | ICD-10-CM

## 2018-08-09 LAB — BASIC METABOLIC PANEL
Anion gap: 9 (ref 5–15)
BUN: 14 mg/dL (ref 8–23)
CO2: 21 mmol/L — AB (ref 22–32)
Calcium: 7.7 mg/dL — ABNORMAL LOW (ref 8.9–10.3)
Chloride: 108 mmol/L (ref 98–111)
Creatinine, Ser: 0.76 mg/dL (ref 0.44–1.00)
GFR calc Af Amer: >60 mL/min (ref >60)
GLUCOSE: 169 mg/dL — AB (ref 70–99)
POTASSIUM: 3.8 mmol/L (ref 3.5–5.1)
Sodium: 138 mmol/L (ref 135–145)

## 2018-08-09 LAB — GLUCOSE, CAPILLARY
GLUCOSE-CAPILLARY: 169 mg/dL — AB (ref 70–99)
GLUCOSE-CAPILLARY: 164 mg/dL — AB (ref 70–99)
Glucose-Capillary: 163 mg/dL — ABNORMAL HIGH (ref 70–99)
Glucose-Capillary: 170 mg/dL — ABNORMAL HIGH (ref 70–99)
Glucose-Capillary: 103 mg/dL — ABNORMAL HIGH (ref 70–99)
Glucose-Capillary: 93 mg/dL (ref 70–99)

## 2018-08-09 LAB — CBC
HCT: 26.5 % — ABNORMAL LOW (ref 36.0–46.0)
HEMOGLOBIN: 7.8 g/dL — AB (ref 12.0–15.0)
MCH: 25.8 pg — AB (ref 26.0–34.0)
MCHC: 29.4 g/dL — AB (ref 30.0–36.0)
MCV: 87.7 fL (ref 80.0–100.0)
PLATELETS: 296 10*3/uL (ref 150–400)
RBC: 3.02 MIL/uL — ABNORMAL LOW (ref 3.87–5.11)
RDW: 15.8 % — ABNORMAL HIGH (ref 11.5–15.5)
WBC: 9.6 10*3/uL (ref 4.0–10.5)
nRBC: 0 % (ref 0.0–0.2)

## 2018-08-09 LAB — HEMOGLOBIN AND HEMATOCRIT, BLOOD
HCT: 29.8 % — ABNORMAL LOW (ref 36.0–46.0)
HCT: 25.1 % — ABNORMAL LOW (ref 36.0–46.0)
HEMATOCRIT: 26.1 % — AB (ref 36.0–46.0)
HEMOGLOBIN: 8 g/dL — AB (ref 12.0–15.0)
Hemoglobin: 8.6 g/dL — ABNORMAL LOW (ref 12.0–15.0)
Hemoglobin: 7.5 g/dL — ABNORMAL LOW (ref 12.0–15.0)

## 2018-08-09 MED ORDER — HYDRALAZINE HCL 20 MG/ML IJ SOLN: 10.0000 mg | Freq: Four times a day (QID) | INTRAMUSCULAR | Status: DC | PRN

## 2018-08-09 MED ORDER — CALCIUM CARBONATE ANTACID 500 MG PO CHEW
1.0000 | CHEWABLE_TABLET | Freq: Every day | ORAL | Status: DC
  Administered 2018-08-09 – 2018-08-14 (×5): 200 mg via ORAL
  Filled 2018-08-09 (×5): qty 1

## 2018-08-09 NOTE — Progress Notes (Signed)
PROGRESS NOTE    [name]  YYT:[id number] DOB: [birth year] DOA: 08/08/2018 PCP: Deland Pretty, MD  Outpatient Specialists:     Brief Narrative:  [name] is an 84 year old female with history of dementia rheumatoid arthritis on prednisone GERD colonic polyp GI bleed in 2014 found to be due to diverticulosis, atrial fibrillation not on any anticoagulation due to GI bleed diabetes mellitus, who was brought to emergency room for evaluation because patient appeared weak and leaning to one side. In the emergency department she was found to have 2 bloody stool   Assessment & Plan:   Active Problems:   GI bleed   1.  GI bleed GI consult appreciated.  GI advised for conservative management due to her advanced dementia and multiple comorbid condition.  We will not be doing any endoscopic procedure.  We will continue IV Protonix and change to p.o. when able to take by mouth 2.  Advanced dementia continue Remeron 3.  Anemia hemoglobin is 8.2 today we will monitor and transfuse if hemoglobin drops and if there is brisk bleeding will consider RBC scan/CTA per recommendation of gastroenterologist.  Will continue ferrous sulfate iron replacement 4.  Rheumatoid arthritis patient takes prednisone 5.  Type 2 diabetes mellitus oral hypoglycemics were on hold due to being n.p.o. for possible EGD.  We will restart her oral antibiotics oral hypoglycemics since the EGD is not going to be done , metformin and Tradjenta 6.  Hypocalcemia patient is not on any calcium I will start her on calcium replacement calcium is 7.8 7.  Hypothyroidism continue levothyroxine 8.  Hypertension we will resume beta-blocker since she is now able to take by mouth    DVT prophylaxis: SCD Code Status:DNR  Family Communication: Sitter at bedside Disposition Plan: Discharge home when stable   Consultants:   Gastroenterologist, Dr. Lyndel Safe  Procedures:   None  Antimicrobials:   None  Subjective: Patient seen at bedside sitter is at bedside slightly restless.  She was able to answer to her name and she stated nice to meet you she has dementia  Objective: Vitals:   08/08/18 1858 08/08/18 2143 08/09/18 0523 08/09/18 1327  BP: (!) 149/92 (!) 126/93 130/64 (!) 169/74  Pulse: 89 77 81 69  Resp: 20 (!) 24 16 (!) 22  Temp: (!) 97.5 F (36.4 C)  (!) 97.5 F (36.4 C) 98.4 F (36.9 C)  TempSrc: Oral  Oral Oral  SpO2: 97% (!) 88% (!) 89% 96%  Weight: 43.5 kg     Height: 5\' 2"  (1.575 m)       Intake/Output Summary (Last 24 hours) at 08/09/2018 1915 Last data filed at 08/09/2018 0616 Gross per 24 hour  Intake 841.95 ml  Output 100 ml  Net 741.95 ml   Filed Weights   08/08/18 1858  Weight: 43.5 kg    Examination:  General exam: Appears calm and comfortable  Respiratory system: Clear to auscultation. Respiratory effort normal. Cardiovascular system: S1 & S2 heard, RRR. No JVD, murmurs, rubs, gallops or clicks. No pedal edema. Gastrointestinal system: Abdomen is nondistended, soft and nontender. No organomegaly or masses felt. Normal bowel sounds heard. Central nervous system: Alert and oriented. No focal neurological deficits. Extremities: Symmetric 5 x 5 power. Skin: No rashes, lesions or ulcers Psychiatry: Judgement and insight not intact.Patient has dementia    Data Reviewed: I have personally reviewed following labs and imaging studies  CBC: Recent Labs  Lab 08/08/18 1120 08/08/18 1528 08/08/18 1910 08/09/18 0229 08/09/18 0649 08/09/18 1128  08/09/18 1813  WBC 8.6 8.1  --   --  9.6  --   --   NEUTROABS 6.2  --   --   --   --   --   --   HGB 10.5* 10.3* 9.7* 8.6* 7.8* 8.0* 7.5*  HCT 34.5* 34.9* 33.2* 29.8* 26.5* 26.1* 25.1*  MCV 87.1 89.0  --   --  87.7  --   --   PLT 291 306  --   --  296  --   --    Basic Metabolic Panel: Recent Labs  Lab 08/08/18 1120 08/09/18 0649  NA 139 138  K 3.8 3.8  CL 104 108  CO2 26 21*  GLUCOSE 173*  169*  BUN 21 14  CREATININE 0.73 0.76  CALCIUM 8.8* 7.7*   GFR: Estimated Creatinine Clearance: 34 mL/min (by C-G formula based on SCr of 0.76 mg/dL). Liver Function Tests: Recent Labs  Lab 08/08/18 1120  AST 24  ALT 14  ALKPHOS 61  BILITOT 0.7  PROT 6.1*  ALBUMIN 3.1*   No results for input(s): LIPASE, AMYLASE in the last 168 hours. No results for input(s): AMMONIA in the last 168 hours. Coagulation Profile: No results for input(s): INR, PROTIME in the last 168 hours. Cardiac Enzymes: No results for input(s): CKTOTAL, CKMB, CKMBINDEX, TROPONINI in the last 168 hours. BNP (last 3 results) No results for input(s): PROBNP in the last 8760 hours. HbA1C: No results for input(s): HGBA1C in the last 72 hours. CBG: Recent Labs  Lab 08/08/18 2144 08/09/18 0846 08/09/18 1112 08/09/18 1235 08/09/18 1651  GLUCAP 169* 163* 170* 164* 103*   Lipid Profile: No results for input(s): CHOL, HDL, LDLCALC, TRIG, CHOLHDL, LDLDIRECT in the last 72 hours. Thyroid Function Tests: No results for input(s): TSH, T4TOTAL, FREET4, T3FREE, THYROIDAB in the last 72 hours. Anemia Panel: No results for input(s): VITAMINB12, FOLATE, FERRITIN, TIBC, IRON, RETICCTPCT in the last 72 hours. Urine analysis:    Component Value Date/Time   COLORURINE YELLOW 08/08/2018 1402   APPEARANCEUR CLEAR 08/08/2018 1402   LABSPEC 1.017 08/08/2018 1402   PHURINE 5.0 08/08/2018 1402   GLUCOSEU NEGATIVE 08/08/2018 1402   HGBUR MODERATE (A) 08/08/2018 1402   HGBUR negative 03/31/2009 1047   BILIRUBINUR NEGATIVE 08/08/2018 1402   KETONESUR NEGATIVE 08/08/2018 1402   PROTEINUR NEGATIVE 08/08/2018 1402   UROBILINOGEN 0.2 08/28/2012 0543   NITRITE NEGATIVE 08/08/2018 1402   LEUKOCYTESUR TRACE (A) 08/08/2018 1402   Sepsis Labs: @LABRCNTIP (procalcitonin:4,lacticidven:4)  )No results found for this or any previous visit (from the past 240 hour(s)).       Radiology Studies: Dg Chest 2 View  Result Date:  08/08/2018 CLINICAL DATA:  Weakness. EXAM: CHEST - 2 VIEW COMPARISON:  Chest x-ray 06/03/2018. FINDINGS: Mediastinum hilar structures normal. Mild bibasilar subsegmental atelectasis/scarring. No acute infiltrate. No pleural effusion or pneumothorax. Cardiomegaly with normal pulmonary vascularity. No acute bony abnormality. IMPRESSION: Cardiomegaly with normal pulmonary vascularity. Mild bibasilar subsegmental atelectasis/scarring. No acute infiltrate. Electronically Signed   By: Marcello Moores  Register   On: 08/08/2018 12:51   Ct Head Wo Contrast  Result Date: 08/08/2018 CLINICAL DATA:  Altered mental status, weaker today, leading to the RIGHT, history atrial fibrillation, type II diabetes mellitus, hypertension EXAM: CT HEAD WITHOUT CONTRAST TECHNIQUE: Contiguous axial images were obtained from the base of the skull through the vertex without intravenous contrast. COMPARISON:  06/03/2018 FINDINGS: Brain: Generalized atrophy. Normal ventricular morphology. No midline shift or mass effect. Small vessel chronic ischemic changes of deep  cerebral white matter. No intracranial hemorrhage, mass lesion, evidence of acute infarction, or extra-axial fluid collection. Vascular: Atherosclerotic calcifications of internal carotid and vertebral arteries at skull base Skull: Demineralized but intact Sinuses/Orbits: Clear Other: N/A IMPRESSION: Atrophy with small vessel chronic ischemic changes of deep cerebral white matter. No acute intracranial abnormalities. Electronically Signed   By: Lavonia Dana M.D.   On: 08/08/2018 14:19        Scheduled Meds: . diclofenac sodium  4 g Topical QID  . insulin aspart  0-5 Units Subcutaneous QHS  . insulin aspart  0-9 Units Subcutaneous TID WC  . leflunomide  20 mg Oral Daily  . levothyroxine  100 mcg Oral Q0600  . mirabegron ER  25 mg Oral Daily  . mirtazapine  7.5 mg Oral QHS  . pravastatin  20 mg Oral q1800   Continuous Infusions: . sodium chloride 75 mL/hr at 08/09/18 1100   . pantoprozole (PROTONIX) infusion 8 mg/hr (08/09/18 1145)     LOS: 1 day    Time spent: 25 minutes    Cristal Deer, MD [facility] Pager [phone]  If 7PM-7AM, please contact night-coverage [email or web address] Password [email or web address] 08/09/2018, 7:15 PM

## 2018-08-09 NOTE — Consult Note (Signed)
Referring Provider: [facility]  Primary Care Physician:  Deland Pretty, MD Primary Gastroenterologist: Dr. Sharlett Iles  MD  Reason for Consultation: GI bleeding   ASSESSMENT AND PLAN:   Assessment: -GI bleed - likely lower GI, likely diverticular bleed. Hb 8.0.  Similar to previous admissions. Pt has been on iron supplements as an outpatient, which may turn the stool dark. -Advanced dementia with contractures. -Gastroesophageal reflux.  Plan: -Advice conservative management since patient has advanced dementia and multiple comorbid conditions.  Hold off on any endoscopic procedures as before. -Trend CBC.  Transfuse if there is further bleeding or drop in hemoglobin. -If brisk bleeding, consider RBC scan/CTA. -Agree with IV Protonix.  I doubt if it is upper GI bleeding (Nl BUN, nontender).  Can change to p.o. Protonix once able to take p.o.       HPI: [name] is a 84 y.o. female  With advanced dementia, RA on prednisone, GERD, colon polyps, GI bleed in 2014 secondary to diverticulosis, atrial fibrillation off anticoagulation due to history of GI bleed/falls, DM brought in after caregiver  Noticed bloody stools. Had one more episode in the ED -dark and burgundy stool.   Hb from 10 down to 8.6 to 7.8 to 8.0.  No abdominal pain.  She has dementia.  Used to be DNR but was revoked by patient's son on admission.    No NSAIDs or aspirin.  Takes prednisone for RA.  Similar admission 11/2017-hematochezia, to be diverticular-no intervention.  GI procedures: - colon (Dr Sharlett Iles) 05/2008: Diminutive colonic polyp, colonic diverticulosis, ?  Rectal prolapse, ?UC.  Past Medical History:  Diagnosis Date  . Acute bronchitis   . Anemia, unspecified   . Atrial fibrillation ([place])    not on AC due to falls  . Depressive disorder, not elsewhere classified   . Diverticulosis of colon (without mention of hemorrhage)   . Esophageal reflux   . Family history of malignant  neoplasm of gastrointestinal tract   . Functional diarrhea   . Hiatal hernia   . History of blood transfusion    "once; related to diverticulitis" (11/29/2017)  . Other and unspecified hyperlipidemia   . Other chest pain   . Other specified cardiac dysrhythmias(427.89)   . Personal history of colonic polyps 07/17/1995   adenomatous polyps  . PONV (postoperative nausea and vomiting)   . Recurrent UTI (urinary tract infection)    "2-3 times in the last 1 1/43yr" (11/29/2017)  . Rheumatoid arthritis ([place])   . Tachycardia, unspecified   . Type II diabetes mellitus ([place])   . Unspecified adverse effect of unspecified drug, medicinal and biological substance   . Unspecified essential hypertension   . Unspecified hypothyroidism     Past Surgical History:  Procedure Laterality Date  . ABDOMINAL HYSTERECTOMY     "partial"  . CATARACT EXTRACTION Bilateral   . FOREARM FRACTURE SURGERY Right 1940s?  . FRACTURE SURGERY    . ORIF FEMUR FRACTURE  08/30/2012   Procedure: OPEN REDUCTION INTERNAL FIXATION (ORIF) DISTAL FEMUR FRACTURE;  Surgeon: Mauri Pole, MD;  Location: [facility];  Service: Orthopedics;  Laterality: Right;  proximal femur  . THYROIDECTOMY, PARTIAL      Prior to Admission medications   Medication Sig Start Date End Date Taking? Authorizing Provider  benzonatate (TESSALON) 200 MG capsule Take 200 mg by mouth 3 (three) times daily as needed for cough. 08/05/18  Yes [provider]  ferrous sulfate 220 (44 Fe) MG/5ML solution Take 440 mg by mouth  daily. Take with large glass of orange juice 30 mins before breakfast.   Yes [provider]  folic acid (FOLVITE) 1 MG tablet Take 1,000 mcg by mouth daily. 07/16/18  Yes [provider]  leflunomide (ARAVA) 20 MG tablet Take 20 mg by mouth daily. 08/14/17  Yes [provider]  levothyroxine (SYNTHROID) 100 MCG tablet Take 1 tablet (100 mcg total) by mouth daily. Patient taking differently: Take 150 mcg by  mouth daily.  12/07/17 12/07/18 Yes Regalado, Belkys A, MD  metFORMIN (GLUCOPHAGE-XR) 500 MG 24 hr tablet Take 500 mg by mouth See admin instructions. Take 1000 mg by mouth in the morning and 500 mg by mouth in the evening 03/07/15  Yes [provider]  mirtazapine (REMERON) 7.5 MG tablet Take 7.5 mg by mouth at bedtime. 08/05/18  Yes [provider]  Multiple Vitamins-Minerals (CENTRUM SILVER 50+WOMEN) TABS Take 1 tablet by mouth daily.   Yes [provider]  MYRBETRIQ 25 MG TB24 tablet Take 25 mg by mouth daily. 06/25/18  Yes [provider]  omeprazole (PRILOSEC) 40 MG capsule Take 1 capsule (40 mg total) by mouth 2 (two) times daily. 12/07/17  Yes Regalado, Belkys A, MD  Pitavastatin Calcium (LIVALO) 2 MG TABS Take 2 mg by mouth every Monday, Wednesday, and Friday.   Yes [provider]  predniSONE (DELTASONE) 1 MG tablet Take 4 mg by mouth daily with breakfast.    Yes [provider]  TRADJENTA 5 MG TABS tablet Take 5 mg by mouth daily. 01/29/18  Yes [provider]  VOLTAREN 1 % GEL Apply 4 g topically 4 (four) times daily. Hands and knees 11/30/13  Yes [provider]  feeding supplement, ENSURE ENLIVE, (ENSURE ENLIVE) LIQD Take 237 mLs by mouth 2 (two) times daily between meals. Patient not taking: Reported on 06/03/2018 02/26/18   Lavina Hamman, MD  magnesium oxide (MAG-OX) 400 (241.3 Mg) MG tablet Take 0.5 tablets (200 mg total) by mouth 2 (two) times daily. Patient not taking: Reported on 08/08/2018 12/07/17   Regalado, Jerald Kief A, MD  Melatonin 2.5 MG CAPS Take 1 capsule (2.5 mg total) by mouth at bedtime as needed (for sleep). Patient not taking: Reported on 08/08/2018 12/07/17   Elmarie Shiley, MD    Current Facility-Administered Medications  Medication Dose Route Frequency Provider Last Rate Last Dose  . 0.9 %  sodium chloride infusion   Intravenous Continuous Guilford Shi, MD 75 mL/hr at 08/09/18 1100    .  acetaminophen (TYLENOL) tablet 650 mg  650 mg Oral Q6H PRN Guilford Shi, MD   650 mg at 08/09/18 0110   Or  . acetaminophen (TYLENOL) suppository 650 mg  650 mg Rectal Q6H PRN Guilford Shi, MD      . benzonatate (TESSALON) capsule 200 mg  200 mg Oral TID PRN Guilford Shi, MD      . diclofenac sodium (VOLTAREN) 1 % transdermal gel 4 g  4 g Topical QID Guilford Shi, MD   4 g at 08/09/18 1042  . insulin aspart (novoLOG) injection 0-5 Units  0-5 Units Subcutaneous QHS Kamineni, Neelima, MD      . insulin aspart (novoLOG) injection 0-9 Units  0-9 Units Subcutaneous TID WC Kamineni, Neelima, MD      . leflunomide (ARAVA) tablet 20 mg  20 mg Oral Daily Guilford Shi, MD   20 mg at 08/09/18 1033  . levothyroxine (SYNTHROID, LEVOTHROID) tablet 100 mcg  100 mcg Oral Q0600 Guilford Shi, MD      .  mirabegron ER (MYRBETRIQ) tablet 25 mg  25 mg Oral Daily Guilford Shi, MD   25 mg at 08/08/18 2140  . mirtazapine (REMERON) tablet 7.5 mg  7.5 mg Oral QHS Guilford Shi, MD   7.5 mg at 08/08/18 2140  . ondansetron (ZOFRAN) tablet 4 mg  4 mg Oral Q6H PRN Guilford Shi, MD       Or  . ondansetron (ZOFRAN) injection 4 mg  4 mg Intravenous Q6H PRN Guilford Shi, MD      . pantoprazole (PROTONIX) 80 mg in sodium chloride 0.9 % 250 mL (0.32 mg/mL) infusion  8 mg/hr Intravenous Continuous Guilford Shi, MD 25 mL/hr at 08/09/18 1145 8 mg/hr at 08/09/18 1145  . pravastatin (PRAVACHOL) tablet 20 mg  20 mg Oral q1800 Guilford Shi, MD        Allergies as of 08/08/2018 - Review Complete 08/08/2018  Allergen Reaction Noted  . Metformin and related Diarrhea 08/08/2018    Family History  Problem Relation Age of Onset  . Colon cancer Maternal Grandmother     Social History   Socioeconomic History  . Marital status: Widowed    Spouse name: Not on file  . Number of children: 3  . Years of education: Not on file  . Highest education level: Not on file  Occupational  History    Employer: RETIRED  Social Needs  . Financial resource strain: Not on file  . Food insecurity:    Worry: Not on file    Inability: Not on file  . Transportation needs:    Medical: Not on file    Non-medical: Not on file  Tobacco Use  . Smoking status: Never Smoker  . Smokeless tobacco: Never Used  Substance and Sexual Activity  . Alcohol use: Not Currently    Alcohol/week: 1.0 standard drinks    Types: 1 Glasses of wine per week  . Drug use: No  . Sexual activity: Never  Lifestyle  . Physical activity:    Days per week: Not on file    Minutes per session: Not on file  . Stress: Not on file  Relationships  . Social connections:    Talks on phone: Not on file    Gets together: Not on file    Attends religious service: Not on file    Active member of club or organization: Not on file    Attends meetings of clubs or organizations: Not on file    Relationship status: Not on file  . Intimate partner violence:    Fear of current or ex partner: Not on file    Emotionally abused: Not on file    Physically abused: Not on file    Forced sexual activity: Not on file  Other Topics Concern  . Not on file  Social History Narrative  . Not on file    Review of Systems: All systems reviewed and negative except where noted in HPI.  Physical Exam: Vital signs in last 24 hours: Temp:  [97.5 F (36.4 C)] 97.5 F (36.4 C) (11/16 0523) Pulse Rate:  [62-95] 81 (11/16 0523) Resp:  [14-26] 16 (11/16 0523) BP: (106-194)/(64-107) 130/64 (11/16 0523) SpO2:  [88 %-100 %] 89 % (11/16 0523) Weight:  [43.5 kg] 43.5 kg (11/15 1858) Last BM Date: 08/08/18 General:   dementia Psych: Nonverbal Lungs:  Clear throughout to auscultation.   No wheezes, crackles, or rhonchi.  Heart:  Regular rate and rhythm; no murmurs, no edema Abdomen:  Soft, non-distended, nontender, BS active, no  palp mass    Rectal:  Deferred  Msk:  Symmetrical without gross deformities. . Pulses:  Normal pulses  noted. Skin:  Intact without significant lesions or rashes..   Intake/Output from previous day: 11/15 0701 - 11/16 0700 In: 1445.6 [I.V.:842; IV Piggyback:603.6] Out: 100 [Urine:100] Intake/Output this shift: No intake/output data recorded.  Lab Results: Recent Labs    08/08/18 1120 08/08/18 1528  08/09/18 0229 08/09/18 0649 08/09/18 1128  WBC 8.6 8.1  --   --  9.6  --   HGB 10.5* 10.3*   < > 8.6* 7.8* 8.0*  HCT 34.5* 34.9*   < > 29.8* 26.5* 26.1*  PLT 291 306  --   --  296  --    < > = values in this interval not displayed.   BMET Recent Labs    08/08/18 1120 08/09/18 0649  NA 139 138  K 3.8 3.8  CL 104 108  CO2 26 21*  GLUCOSE 173* 169*  BUN 21 14  CREATININE 0.73 0.76  CALCIUM 8.8* 7.7*   LFT Recent Labs    08/08/18 1120  PROT 6.1*  ALBUMIN 3.1*  AST 24  ALT 14  ALKPHOS 61  BILITOT 0.7   PT/INR No results for input(s): LABPROT, INR in the last 72 hours. Hepatitis Panel No results for input(s): HEPBSAG, HCVAB, HEPAIGM, HEPBIGM in the last 72 hours.    Studies/Results: Dg Chest 2 View  Result Date: 08/08/2018 CLINICAL DATA:  Weakness. EXAM: CHEST - 2 VIEW COMPARISON:  Chest x-ray 06/03/2018. FINDINGS: Mediastinum hilar structures normal. Mild bibasilar subsegmental atelectasis/scarring. No acute infiltrate. No pleural effusion or pneumothorax. Cardiomegaly with normal pulmonary vascularity. No acute bony abnormality. IMPRESSION: Cardiomegaly with normal pulmonary vascularity. Mild bibasilar subsegmental atelectasis/scarring. No acute infiltrate. Electronically Signed   By: Marcello Moores  Register   On: 08/08/2018 12:51   Ct Head Wo Contrast  Result Date: 08/08/2018 CLINICAL DATA:  Altered mental status, weaker today, leading to the RIGHT, history atrial fibrillation, type II diabetes mellitus, hypertension EXAM: CT HEAD WITHOUT CONTRAST TECHNIQUE: Contiguous axial images were obtained from the base of the skull through the vertex without intravenous  contrast. COMPARISON:  06/03/2018 FINDINGS: Brain: Generalized atrophy. Normal ventricular morphology. No midline shift or mass effect. Small vessel chronic ischemic changes of deep cerebral white matter. No intracranial hemorrhage, mass lesion, evidence of acute infarction, or extra-axial fluid collection. Vascular: Atherosclerotic calcifications of internal carotid and vertebral arteries at skull base Skull: Demineralized but intact Sinuses/Orbits: Clear Other: N/A IMPRESSION: Atrophy with small vessel chronic ischemic changes of deep cerebral white matter. No acute intracranial abnormalities. Electronically Signed   By: Lavonia Dana M.D.   On: 08/08/2018 14:19     Carmell Austria MD @  08/09/2018, 1:14 PM

## 2018-08-10 ENCOUNTER — Other Ambulatory Visit: Payer: Self-pay

## 2018-08-10 LAB — HEMOGLOBIN AND HEMATOCRIT, BLOOD
HCT: 22.4 % — ABNORMAL LOW (ref 36.0–46.0)
HCT: 32.7 % — ABNORMAL LOW (ref 36.0–46.0)
HEMATOCRIT: 32.7 % — AB (ref 36.0–46.0)
HEMOGLOBIN: 10.3 g/dL — AB (ref 12.0–15.0)
HEMOGLOBIN: 9.9 g/dL — AB (ref 12.0–15.0)
Hemoglobin: 6.6 g/dL — CL (ref 12.0–15.0)

## 2018-08-10 LAB — CBC WITH DIFFERENTIAL/PLATELET
ABS IMMATURE GRANULOCYTES: 0.06 10*3/uL (ref 0.00–0.07)
BASOS ABS: 0.1 10*3/uL (ref 0.0–0.1)
Basophils Relative: 1 %
Eosinophils Absolute: 0.1 10*3/uL (ref 0.0–0.5)
Eosinophils Relative: 1 %
HCT: 35.4 % — ABNORMAL LOW (ref 36.0–46.0)
HEMOGLOBIN: 10.7 g/dL — AB (ref 12.0–15.0)
IMMATURE GRANULOCYTES: 1 %
LYMPHS ABS: 1 10*3/uL (ref 0.7–4.0)
LYMPHS PCT: 9 %
MCH: 26.2 pg (ref 26.0–34.0)
MCHC: 30.2 g/dL (ref 30.0–36.0)
MCV: 86.8 fL (ref 80.0–100.0)
Monocytes Absolute: 0.9 10*3/uL (ref 0.1–1.0)
Monocytes Relative: 8 %
NEUTROS PCT: 80 %
NRBC: 0 % (ref 0.0–0.2)
Neutro Abs: 8.6 10*3/uL — ABNORMAL HIGH (ref 1.7–7.7)
Platelets: 234 10*3/uL (ref 150–400)
RBC: 4.08 MIL/uL (ref 3.87–5.11)
RDW: 16 % — ABNORMAL HIGH (ref 11.5–15.5)
WBC: 10.7 10*3/uL — ABNORMAL HIGH (ref 4.0–10.5)

## 2018-08-10 LAB — GLUCOSE, CAPILLARY
GLUCOSE-CAPILLARY: 120 mg/dL — AB (ref 70–99)
GLUCOSE-CAPILLARY: 123 mg/dL — AB (ref 70–99)
GLUCOSE-CAPILLARY: 62 mg/dL — AB (ref 70–99)
Glucose-Capillary: 74 mg/dL (ref 70–99)
Glucose-Capillary: 123 mg/dL — ABNORMAL HIGH (ref 70–99)

## 2018-08-10 LAB — PREPARE RBC (CROSSMATCH)

## 2018-08-10 MED ORDER — DEXTROSE 50 % IV SOLN
INTRAVENOUS | Status: AC
  Administered 2018-08-10: 12.5 g via INTRAVENOUS
  Filled 2018-08-10: qty 50

## 2018-08-10 MED ORDER — ORAL CARE MOUTH RINSE
15.0000 mL | Freq: Two times a day (BID) | OROMUCOSAL | Status: DC
  Administered 2018-08-10 – 2018-08-14 (×8): 15 mL via OROMUCOSAL

## 2018-08-10 MED ORDER — INFLUENZA VAC SPLIT HIGH-DOSE 0.5 ML IM SUSY
0.5000 mL | PREFILLED_SYRINGE | INTRAMUSCULAR | Status: AC
  Administered 2018-08-11: 0.5 mL via INTRAMUSCULAR
  Filled 2018-08-10: qty 0.5

## 2018-08-10 MED ORDER — DEXTROSE 50 % IV SOLN
12.5000 g | Freq: Once | INTRAVENOUS | Status: AC
  Administered 2018-08-10: 12.5 g via INTRAVENOUS

## 2018-08-10 MED ORDER — FUROSEMIDE 10 MG/ML IJ SOLN
20.0000 mg | Freq: Once | INTRAMUSCULAR | Status: DC
  Filled 2018-08-10 (×3): qty 2

## 2018-08-10 MED ORDER — ACETAMINOPHEN 325 MG PO TABS
650.0000 mg | ORAL_TABLET | Freq: Once | ORAL | Status: DC
  Filled 2018-08-10 (×3): qty 2

## 2018-08-10 MED ORDER — SODIUM CHLORIDE 0.9% IV SOLUTION
Freq: Once | INTRAVENOUS | Status: AC
  Administered 2018-08-10: 03:00:00 via INTRAVENOUS

## 2018-08-10 MED ORDER — SODIUM CHLORIDE 0.9% IV SOLUTION: Freq: Once | INTRAVENOUS | Status: DC

## 2018-08-10 NOTE — Progress Notes (Addendum)
PROGRESS NOTE    [name]  IEP:[id number] DOB: 1928-10-18 DOA: 08/08/2018 PCP: Deland Pretty, MD  Outpatient Specialists:     Brief Narrative:  [name] is an 84 year old female with history of dementia rheumatoid arthritis on prednisone GERD colonic polyp GI bleed in 2014 found to be due to diverticulosis, atrial fibrillation not on any anticoagulation due to GI bleed diabetes mellitus, who was brought to emergency room for evaluation because patient appeared weak and leaning to one side. In the emergency department she was found to have 2 bloody stool   Assessment & Plan:   Active Problems:   GI bleed   1.  GI bleed GI consult appreciated.  GI advised for conservative management due to her advanced dementia and multiple comorbid condition.  We will not be doing any endoscopic procedure.  We will continue IV Protonix and change to p.o. when able to take by mouth 2.  Advanced dementia continue Remeron 3.  Anemia  patient has dropped her hemoglobin to 6.6 today.  I will order blood transfusion x2 units  and if there is brisk bleeding will consider RBC scan/CTA per recommendation of gastroenterologist.  Will continue ferrous sulfate iron replacement 4.  Rheumatoid arthritis patient takes prednisone but is on hold due to GI bleed 5.  Type 2 diabetes mellitus oral hypoglycemics were on hold due to being n.p.o. for possible EGD.  We will restart her oral antibiotics oral hypoglycemics since the EGD is not going to be done , metformin and Tradjenta 6.  Hypocalcemia patient is not on any calcium I will start her on calcium replacement calcium is 7.8 7.  Hypothyroidism continue levothyroxine 8.  Hypertension we will resume beta-blocker since she is now able to take by mouth 9.  Right great toe ulcer awaiting wound consult    DVT prophylaxis: SCD Code Status:DNR  Family Communication: Sitter at bedside Disposition Plan: Discharge home when stable   Consultants:    Gastroenterologist, Dr. Lyndel Safe  Procedures:   None  Antimicrobials:  None  Subjective: Patient seen at bedside sitter is at bedside slightly restless.  She was able to answer to her name and she stated nice to meet you she has dementia  August 10, 2018:   Patient seen at bedside no status at bedside today she is sleeping quietly.  She has not had any more GI bleed.  Gastroenterology has signed off the advised to continue conservative management due to her dementia patient has dropped her hemoglobin to 6.6 today.  I will order blood transfusion x2 units  Objective: Vitals:   08/10/18 0525 08/10/18 0820 08/10/18 1355 08/10/18 2136  BP: (!) 145/87 132/68 128/75 126/66  Pulse: 75  (!) 46 67  Resp: 14 16 (!) 22   Temp: 97.9 F (36.6 C) 97.6 F (36.4 C) 97.9 F (36.6 C) 98.5 F (36.9 C)  TempSrc: Axillary Oral  Oral  SpO2: 98% 97% 95% 94%  Weight:      Height:        Intake/Output Summary (Last 24 hours) at 08/10/2018 2142 Last data filed at 08/10/2018 1350 Gross per 24 hour  Intake 1228 ml  Output 950 ml  Net 278 ml   Filed Weights   08/08/18 1858  Weight: 43.5 kg    Examination:  General exam: Appears calm and comfortable  Respiratory system: Clear to auscultation. Respiratory effort normal. Cardiovascular system: S1 & S2 heard, RRR. No JVD, murmurs, rubs, gallops or clicks. No pedal edema. Gastrointestinal system: Abdomen is  nondistended, soft and nontender. No organomegaly or masses felt. Normal bowel sounds heard. Central nervous system: Alert and oriented. No focal neurological deficits. Extremities: Symmetric 5 x 5 power. Skin: No rashes, Right 2nd toe with dry scabbed full thickness wound lesions or ulcers Psychiatry: Judgement and insight not intact.Patient has dementia    Data Reviewed: I have personally reviewed following labs and imaging studies  CBC: Recent Labs  Lab 08/08/18 1120 08/08/18 1528  08/09/18 0649 08/09/18 1128 08/09/18 1813  08/10/18 0008 08/10/18 1031 08/10/18 1610  WBC 8.6 8.1  --  9.6  --   --   --  10.7*  --   NEUTROABS 6.2  --   --   --   --   --   --  8.6*  --   HGB 10.5* 10.3*   < > 7.8* 8.0* 7.5* 6.6* 10.7* 10.3*  HCT 34.5* 34.9*   < > 26.5* 26.1* 25.1* 22.4* 35.4* 32.7*  MCV 87.1 89.0  --  87.7  --   --   --  86.8  --   PLT 291 306  --  296  --   --   --  234  --    < > = values in this interval not displayed.   Basic Metabolic Panel: Recent Labs  Lab 08/08/18 1120 08/09/18 0649  NA 139 138  K 3.8 3.8  CL 104 108  CO2 26 21*  GLUCOSE 173* 169*  BUN 21 14  CREATININE 0.73 0.76  CALCIUM 8.8* 7.7*   GFR: Estimated Creatinine Clearance: 34 mL/min (by C-G formula based on SCr of 0.76 mg/dL). Liver Function Tests: Recent Labs  Lab 08/08/18 1120  AST 24  ALT 14  ALKPHOS 61  BILITOT 0.7  PROT 6.1*  ALBUMIN 3.1*   No results for input(s): LIPASE, AMYLASE in the last 168 hours. No results for input(s): AMMONIA in the last 168 hours. Coagulation Profile: No results for input(s): INR, PROTIME in the last 168 hours. Cardiac Enzymes: No results for input(s): CKTOTAL, CKMB, CKMBINDEX, TROPONINI in the last 168 hours. BNP (last 3 results) No results for input(s): PROBNP in the last 8760 hours. HbA1C: No results for input(s): HGBA1C in the last 72 hours. CBG: Recent Labs  Lab 08/09/18 2222 08/10/18 0752 08/10/18 1212 08/10/18 1733 08/10/18 2137  GLUCAP 93 120* 123* 74 62*   Lipid Profile: No results for input(s): CHOL, HDL, LDLCALC, TRIG, CHOLHDL, LDLDIRECT in the last 72 hours. Thyroid Function Tests: No results for input(s): TSH, T4TOTAL, FREET4, T3FREE, THYROIDAB in the last 72 hours. Anemia Panel: No results for input(s): VITAMINB12, FOLATE, FERRITIN, TIBC, IRON, RETICCTPCT in the last 72 hours. Urine analysis:    Component Value Date/Time   COLORURINE YELLOW 08/08/2018 1402   APPEARANCEUR CLEAR 08/08/2018 1402   LABSPEC 1.017 08/08/2018 1402   PHURINE 5.0 08/08/2018  1402   GLUCOSEU NEGATIVE 08/08/2018 1402   HGBUR MODERATE (A) 08/08/2018 1402   HGBUR negative 03/31/2009 1047   BILIRUBINUR NEGATIVE 08/08/2018 1402   KETONESUR NEGATIVE 08/08/2018 1402   PROTEINUR NEGATIVE 08/08/2018 1402   UROBILINOGEN 0.2 08/28/2012 0543   NITRITE NEGATIVE 08/08/2018 1402   LEUKOCYTESUR TRACE (A) 08/08/2018 1402   Sepsis Labs: @LABRCNTIP (procalcitonin:4,lacticidven:4)  )No results found for this or any previous visit (from the past 240 hour(s)).       Radiology Studies: No results found.      Scheduled Meds: . calcium carbonate  1 tablet Oral Daily  . diclofenac sodium  4 g Topical  QID  . [START ON 08/11/2018] Influenza vac split quadrivalent PF  0.5 mL Intramuscular Tomorrow-1000  . insulin aspart  0-5 Units Subcutaneous QHS  . insulin aspart  0-9 Units Subcutaneous TID WC  . leflunomide  20 mg Oral Daily  . levothyroxine  100 mcg Oral Q0600  . mouth rinse  15 mL Mouth Rinse BID  . mirabegron ER  25 mg Oral Daily  . mirtazapine  7.5 mg Oral QHS  . pravastatin  20 mg Oral q1800   Continuous Infusions: . sodium chloride 75 mL/hr at 08/10/18 1249  . pantoprozole (PROTONIX) infusion 8 mg/hr (08/10/18 2133)     LOS: 2 days    Time spent: 25 minutes    Cristal Deer, MD [facility] Pager [phone]  If 7PM-7AM, please contact night-coverage [email or web address] Password [facility] 08/10/2018, 9:42 PM

## 2018-08-10 NOTE — Progress Notes (Signed)
CRITICAL VALUE ALERT  Critical Value:  Hmg 6.6  Date & Time Notied:  08/11/15 0105  Provider Notified: Yes  Orders Received/Actions taken: Transfuse blood

## 2018-08-10 NOTE — Progress Notes (Signed)
SLP Cancellation Note  Patient Details Name: [name] MRN: [id number] DOB: 10-09-1928   Cancelled treatment:        Patient too lethargic/unable to wake for swallow evaluation. Will check back at next available time.    Charlynne Cousins Aarik Blank, MA, CCC-SLP 08/10/2018 3:21 PM

## 2018-08-10 NOTE — Progress Notes (Signed)
CRITICAL VALUE ALERT  Critical Value:  BS 62  Date & Time Notied:  08/10/18 2141  Provider Notified: YES  Orders Received/Actions taken: 1/2 amp of D5 given

## 2018-08-10 NOTE — Progress Notes (Signed)
Pt noted to have a tablet stuck to the roof of her mouth. Pt is not oriented and unable to move it on her own. HOB raised to 90 degrees to prevent aspiration. Will continue to monitor.

## 2018-08-10 NOTE — Plan of Care (Signed)

## 2018-08-10 NOTE — Progress Notes (Addendum)
IMPRESSION and PLAN:    Assessment: -GI bleed - likely lower GI, likely diverticular bleed. Hb 8.0 to 6.6 s/p 2U PRBC.  Similar to previous admissions. Pt has been on iron supplements as an outpatient, which may turn the stool dark. -Advanced dementia with contractures. -Gastroesophageal reflux.  Plan: -Still advice conservative management since patient has advanced dementia and multiple comorbid conditions.  Hold off on any endoscopic procedures for now. -Trend CBC.   Repeat hemoglobin is scheduled for this afternoon. -If brisk bleeding, consider RBC scan/CTA. -Agree with IV Protonix.  I doubt if it is upper GI bleeding (Nl BUN).  Can change to p.o. Protonix once able to take p.o.     Hb improved to 10.7 after 2 units of PRBC.  Looks like the bleeding has stopped.  No endoscopic procedures planned.  Will sign off for now.  Please call if with any recurrent bleeding. HPI:    Drop in hemoglobin to 6.6, status post 2 units of PRBC. Patient had few bloody bowel movements. Advanced dementia with disorientation and contractures.   Past Medical History:  Diagnosis Date  . Acute bronchitis   . Anemia, unspecified   . Atrial fibrillation ([place])    not on AC due to falls  . Depressive disorder, not elsewhere classified   . Diverticulosis of colon (without mention of hemorrhage)   . Esophageal reflux   . Family history of malignant neoplasm of gastrointestinal tract   . Functional diarrhea   . Hiatal hernia   . History of blood transfusion    "once; related to diverticulitis" (11/29/2017)  . Other and unspecified hyperlipidemia   . Other chest pain   . Other specified cardiac dysrhythmias(427.89)   . Personal history of colonic polyps 07/17/1995   adenomatous polyps  . PONV (postoperative nausea and vomiting)   . Recurrent UTI (urinary tract infection)    "2-3 times in the last 1 1/84yr" (11/29/2017)  . Rheumatoid arthritis ([place])   . Tachycardia, unspecified   . Type II  diabetes mellitus ([place])   . Unspecified adverse effect of unspecified drug, medicinal and biological substance   . Unspecified essential hypertension   . Unspecified hypothyroidism     Current Facility-Administered Medications  Medication Dose Route Frequency Provider Last Rate Last Dose  . 0.9 %  sodium chloride infusion   Intravenous Continuous Guilford Shi, MD 75 mL/hr at 08/10/18 0035    . acetaminophen (TYLENOL) tablet 650 mg  650 mg Oral Q6H PRN Guilford Shi, MD   650 mg at 08/10/18 0541   Or  . acetaminophen (TYLENOL) suppository 650 mg  650 mg Rectal Q6H PRN Guilford Shi, MD      . benzonatate (TESSALON) capsule 200 mg  200 mg Oral TID PRN Guilford Shi, MD      . calcium carbonate (TUMS - dosed in mg elemental calcium) chewable tablet 200 mg of elemental calcium  1 tablet Oral Daily Cristal Deer, MD   200 mg of elemental calcium at 08/09/18 2238  . diclofenac sodium (VOLTAREN) 1 % transdermal gel 4 g  4 g Topical QID Guilford Shi, MD   4 g at 08/09/18 2242  . hydrALAZINE (APRESOLINE) injection 10 mg  10 mg Intravenous Q6H PRN Bodenheimer, Charles A, NP      . insulin aspart (novoLOG) injection 0-5 Units  0-5 Units Subcutaneous QHS Kamineni, Neelima, MD      . insulin aspart (novoLOG) injection 0-9 Units  0-9 Units Subcutaneous TID WC Kamineni,  Neelima, MD   2 Units at 08/09/18 1322  . leflunomide (ARAVA) tablet 20 mg  20 mg Oral Daily Guilford Shi, MD   20 mg at 08/09/18 1033  . levothyroxine (SYNTHROID, LEVOTHROID) tablet 100 mcg  100 mcg Oral Q0600 Guilford Shi, MD   100 mcg at 08/10/18 0541  . mirabegron ER (MYRBETRIQ) tablet 25 mg  25 mg Oral Daily Guilford Shi, MD   25 mg at 08/08/18 2140  . mirtazapine (REMERON) tablet 7.5 mg  7.5 mg Oral QHS Guilford Shi, MD   7.5 mg at 08/09/18 2238  . ondansetron (ZOFRAN) tablet 4 mg  4 mg Oral Q6H PRN Guilford Shi, MD       Or  . ondansetron (ZOFRAN) injection 4 mg  4 mg Intravenous Q6H  PRN Guilford Shi, MD      . pantoprazole (PROTONIX) 80 mg in sodium chloride 0.9 % 250 mL (0.32 mg/mL) infusion  8 mg/hr Intravenous Continuous Guilford Shi, MD 25 mL/hr at 08/10/18 1005 8 mg/hr at 08/10/18 1005  . pravastatin (PRAVACHOL) tablet 20 mg  20 mg Oral q1800 Guilford Shi, MD   20 mg at 08/09/18 1842    Past Surgical History:  Procedure Laterality Date  . ABDOMINAL HYSTERECTOMY     "partial"  . CATARACT EXTRACTION Bilateral   . FOREARM FRACTURE SURGERY Right 1940s?  . FRACTURE SURGERY    . ORIF FEMUR FRACTURE  08/30/2012   Procedure: OPEN REDUCTION INTERNAL FIXATION (ORIF) DISTAL FEMUR FRACTURE;  Surgeon: Mauri Pole, MD;  Location: [facility];  Service: Orthopedics;  Laterality: Right;  proximal femur  . THYROIDECTOMY, PARTIAL      Family History  Problem Relation Age of Onset  . Colon cancer Maternal Grandmother     Social History   Tobacco Use  . Smoking status: Never Smoker  . Smokeless tobacco: Never Used  Substance Use Topics  . Alcohol use: Not Currently    Alcohol/week: 1.0 standard drinks    Types: 1 Glasses of wine per week  . Drug use: No    Allergies  Allergen Reactions  . Metformin And Related Diarrhea      Physical Exam:     BP 132/68   Pulse 75   Temp 97.6 F (36.4 C) (Oral)   Resp 16   Ht 5\' 2"  (1.575 m)   Wt 43.5 kg   SpO2 97%   BMI 17.54 kg/m  @WEIGHTLAST3 @ GENERAL:  PULM: Normal respiratory effort, lungs CTA bilaterally, no wheezing. ABDOMEN: Inspection: No visible peristalsis, no abnormal pulsations, skin normal.  Palpation/percussion: Soft, mildly tender, nondistended, no rigidity, no abnormal dullness to percussion, no hepatosplenomegaly and no palpable abdominal masses.  Auscultation: Normal bowel sounds, no abdominal bruits.   Data Reviewed: I have personally reviewed following labs and imaging studies  CBC: Recent Labs  Lab 08/08/18 1120 08/08/18 1528  08/09/18 0649 08/09/18 1128 08/09/18 1813  08/10/18 0008  WBC 8.6 8.1  --  9.6  --   --   --   NEUTROABS 6.2  --   --   --   --   --   --   HGB 10.5* 10.3*   < > 7.8* 8.0* 7.5* 6.6*  HCT 34.5* 34.9*   < > 26.5* 26.1* 25.1* 22.4*  MCV 87.1 89.0  --  87.7  --   --   --   PLT 291 306  --  296  --   --   --    < > = values  in this interval not displayed.   Basic Metabolic Panel: Recent Labs  Lab 08/08/18 1120 08/09/18 0649  NA 139 138  K 3.8 3.8  CL 104 108  CO2 26 21*  GLUCOSE 173* 169*  BUN 21 14  CREATININE 0.73 0.76  CALCIUM 8.8* 7.7*   GFR: Estimated Creatinine Clearance: 34 mL/min (by C-G formula based on SCr of 0.76 mg/dL). Liver Function Tests: Recent Labs  Lab 08/08/18 1120  AST 24  ALT 14  ALKPHOS 61  BILITOT 0.7  PROT 6.1*  ALBUMIN 3.1*     Radiology Studies: Dg Chest 2 View  Result Date: 08/08/2018 CLINICAL DATA:  Weakness. EXAM: CHEST - 2 VIEW COMPARISON:  Chest x-ray 06/03/2018. FINDINGS: Mediastinum hilar structures normal. Mild bibasilar subsegmental atelectasis/scarring. No acute infiltrate. No pleural effusion or pneumothorax. Cardiomegaly with normal pulmonary vascularity. No acute bony abnormality. IMPRESSION: Cardiomegaly with normal pulmonary vascularity. Mild bibasilar subsegmental atelectasis/scarring. No acute infiltrate. Electronically Signed   By: Marcello Moores  Register   On: 08/08/2018 12:51   Ct Head Wo Contrast  Result Date: 08/08/2018 CLINICAL DATA:  Altered mental status, weaker today, leading to the RIGHT, history atrial fibrillation, type II diabetes mellitus, hypertension EXAM: CT HEAD WITHOUT CONTRAST TECHNIQUE: Contiguous axial images were obtained from the base of the skull through the vertex without intravenous contrast. COMPARISON:  06/03/2018 FINDINGS: Brain: Generalized atrophy. Normal ventricular morphology. No midline shift or mass effect. Small vessel chronic ischemic changes of deep cerebral white matter. No intracranial hemorrhage, mass lesion, evidence of acute infarction,  or extra-axial fluid collection. Vascular: Atherosclerotic calcifications of internal carotid and vertebral arteries at skull base Skull: Demineralized but intact Sinuses/Orbits: Clear Other: N/A IMPRESSION: Atrophy with small vessel chronic ischemic changes of deep cerebral white matter. No acute intracranial abnormalities. Electronically Signed   By: Lavonia Dana M.D.   On: 08/08/2018 14:19      Chariti Havel,MD 08/10/2018, 10:48 AM   CC No ref. provider found

## 2018-08-11 LAB — GLUCOSE, CAPILLARY
GLUCOSE-CAPILLARY: 68 mg/dL — AB (ref 70–99)
GLUCOSE-CAPILLARY: 148 mg/dL — AB (ref 70–99)
Glucose-Capillary: 136 mg/dL — ABNORMAL HIGH (ref 70–99)
Glucose-Capillary: 108 mg/dL — ABNORMAL HIGH (ref 70–99)
Glucose-Capillary: 140 mg/dL — ABNORMAL HIGH (ref 70–99)

## 2018-08-11 LAB — HEMOGLOBIN AND HEMATOCRIT, BLOOD
HEMATOCRIT: 34 % — AB (ref 36.0–46.0)
HEMATOCRIT: 31.5 % — AB (ref 36.0–46.0)
HEMOGLOBIN: 10.5 g/dL — AB (ref 12.0–15.0)
HEMOGLOBIN: 10.1 g/dL — AB (ref 12.0–15.0)

## 2018-08-11 LAB — MRSA PCR SCREENING: MRSA by PCR: POSITIVE — AB

## 2018-08-11 MED ORDER — PANTOPRAZOLE SODIUM 40 MG IV SOLR
40.0000 mg | Freq: Two times a day (BID) | INTRAVENOUS | Status: DC
  Administered 2018-08-11 – 2018-08-12 (×2): 40 mg via INTRAVENOUS
  Filled 2018-08-11 (×2): qty 40

## 2018-08-11 MED ORDER — PREDNISONE 5 MG PO TABS
5.0000 mg | ORAL_TABLET | Freq: Every day | ORAL | Status: DC
  Administered 2018-08-12 – 2018-08-14 (×3): 5 mg via ORAL
  Filled 2018-08-11 (×3): qty 1

## 2018-08-11 MED ORDER — MUPIROCIN 2 % EX OINT
1.0000 "application " | TOPICAL_OINTMENT | Freq: Two times a day (BID) | CUTANEOUS | Status: DC
  Administered 2018-08-11 – 2018-08-14 (×7): 1 via NASAL
  Filled 2018-08-11 (×4): qty 22

## 2018-08-11 MED ORDER — CHLORHEXIDINE GLUCONATE CLOTH 2 % EX PADS
6.0000 | MEDICATED_PAD | Freq: Every day | CUTANEOUS | Status: DC
  Administered 2018-08-11 – 2018-08-14 (×4): 6 via TOPICAL

## 2018-08-11 MED ORDER — COLLAGENASE 250 UNIT/GM EX OINT
TOPICAL_OINTMENT | Freq: Every day | CUTANEOUS | Status: DC
  Administered 2018-08-11 – 2018-08-14 (×4): via TOPICAL
  Filled 2018-08-11: qty 30

## 2018-08-11 MED ORDER — SODIUM CHLORIDE 0.9 % IV SOLN: 8.0000 mg/h | INTRAVENOUS | Status: DC

## 2018-08-11 MED ORDER — DEXTROSE 50 % IV SOLN
INTRAVENOUS | Status: AC
  Administered 2018-08-11: 25 mL
  Filled 2018-08-11: qty 50

## 2018-08-11 NOTE — Progress Notes (Addendum)
PROGRESS NOTE    [name]  GBT:[id number] DOB: Jan 10, 1929 DOA: 08/08/2018 PCP: Deland Pretty, MD  Outpatient Specialists:     Brief Narrative:  [name] is an 84 year old female with history of dementia rheumatoid arthritis on prednisone GERD colonic polyp GI bleed in 2014 found to be due to diverticulosis, atrial fibrillation not on any anticoagulation due to GI bleed diabetes mellitus, who was brought to emergency room for evaluation because patient appeared weak and leaning to one side. In the emergency department she was found to have 2 bloody stool   Assessment & Plan:   Active Problems:   GI bleed   1.  GI bleed GI consult appreciated.  GI advised for conservative management due to her advanced dementia and multiple comorbid condition.  We will not be doing any endoscopic procedure.  We will continue IV Protonix and change to p.o. when able to take by mouth 2.  Advanced dementia continue Remeron 3.  Anemia  patient has dropped her hemoglobin to 10.5 THEN 10.1 today.  Will stop every 12 hours hemoglobin check.  And if there is brisk bleeding will consider RBC scan/CTA per recommendation of gastroenterologist.  Gastroenterologist has signed off will continue ferrous sulfate iron replacement 4.  Rheumatoid arthritis patient takes prednisone prednisone was on hold due to GI bleed.  Will restart her prednisone tomorrow 5.  Type 2 diabetes mellitus oral hypoglycemics were on hold due to being n.p.o. for possible EGD.  We will restart her oral antibiotics oral hypoglycemics since the EGD is not going to be done , metformin and Tradjenta 6.  Hypocalcemia patient is not on any calcium I will start her on calcium replacement calcium is 7.8 7.  Hypothyroidism continue levothyroxine 8.  Hypertension we will resume beta-blocker since she is now able to take by mouth 9.  I will obtain physical therapy evaluation possible discharge in the morning    DVT prophylaxis:  SCD Code Status:DNR  Family Communication: Sitter at bedside Disposition Plan: Discharge home when stable   Consultants:   Gastroenterologist, Dr. Lyndel Safe  Procedures:   None  Antimicrobials:  None  Subjective: Patient seen at bedside sitter is at bedside slightly restless.  She was able to answer to her name and she stated nice to meet you she has dementia  August 10, 2018:   Patient seen at bedside no status at bedside today she is sleeping quietly.  She has not had any more GI bleed.  Gastroenterology has signed off the advised to continue conservative management due to her dementia patient has dropped her hemoglobin to 6.6 today.  I will order blood transfusion x2 units  August 11, 2018: Patient received 2 units of packed RBC yesterday. denies any complaints except like the night she had a bit of agitation requiring as needed sedation   Objective: Vitals:   08/10/18 1355 08/10/18 2136 08/11/18 0628 08/11/18 1613  BP: 128/75 126/66 (!) 155/89 (!) 129/98  Pulse: (!) 46 67 73 (!) 59  Resp: (!) 22     Temp: 97.9 F (36.6 C) 98.5 F (36.9 C) (!) 97.5 F (36.4 C) 98.1 F (36.7 C)  TempSrc:  Oral Oral Axillary  SpO2: 95% 94% 97% 93%  Weight:      Height:        Intake/Output Summary (Last 24 hours) at 08/11/2018 1843 Last data filed at 08/11/2018 0150 Gross per 24 hour  Intake 1200 ml  Output -  Net 1200 ml   [employer]  08/08/18 1858  Weight: 43.5 kg    Examination:  General exam: Appears calm and comfortable lying in bed Respiratory system: Clear to auscultation. Respiratory effort normal. Cardiovascular system: S1 & S2 heard, RRR. No JVD, murmurs, rubs, gallops or clicks. No pedal edema. Gastrointestinal system: Abdomen is nondistended, soft and nontender. No organomegaly or masses felt. Normal bowel sounds heard. Central nervous system: Alert and oriented. No focal neurological deficits. Extremities: Symmetric 5 x 5 power.  No edema Skin: No  rashes, Right 2nd toe with dry scabbed full thickness wound lesions or ulcers Psychiatry: Judgement and insight not intact.Patient has dementia    Data Reviewed: I have personally reviewed following labs and imaging studies  CBC: Recent Labs  Lab 08/08/18 1120 08/08/18 1528  08/09/18 0649  08/10/18 1031 08/10/18 1610 08/10/18 2226 08/11/18 0549 08/11/18 1207  WBC 8.6 8.1  --  9.6  --  10.7*  --   --   --   --   NEUTROABS 6.2  --   --   --   --  8.6*  --   --   --   --   HGB 10.5* 10.3*   < > 7.8*   < > 10.7* 10.3* 9.9* 10.5* 10.1*  HCT 34.5* 34.9*   < > 26.5*   < > 35.4* 32.7* 32.7* 34.0* 31.5*  MCV 87.1 89.0  --  87.7  --  86.8  --   --   --   --   PLT 291 306  --  296  --  234  --   --   --   --    < > = values in this interval not displayed.   Basic Metabolic Panel: Recent Labs  Lab 08/08/18 1120 08/09/18 0649  NA 139 138  K 3.8 3.8  CL 104 108  CO2 26 21*  GLUCOSE 173* 169*  BUN 21 14  CREATININE 0.73 0.76  CALCIUM 8.8* 7.7*   GFR: Estimated Creatinine Clearance: 34 mL/min (by C-G formula based on SCr of 0.76 mg/dL). Liver Function Tests: Recent Labs  Lab 08/08/18 1120  AST 24  ALT 14  ALKPHOS 61  BILITOT 0.7  PROT 6.1*  ALBUMIN 3.1*   No results for input(s): LIPASE, AMYLASE in the last 168 hours. No results for input(s): AMMONIA in the last 168 hours. Coagulation Profile: No results for input(s): INR, PROTIME in the last 168 hours. Cardiac Enzymes: No results for input(s): CKTOTAL, CKMB, CKMBINDEX, TROPONINI in the last 168 hours. BNP (last 3 results) No results for input(s): PROBNP in the last 8760 hours. HbA1C: No results for input(s): HGBA1C in the last 72 hours. CBG: Recent Labs  Lab 08/10/18 2245 08/11/18 0757 08/11/18 0903 08/11/18 1219 08/11/18 1733  GLUCAP 123* 68* 148* 136* 108*   Lipid Profile: No results for input(s): CHOL, HDL, LDLCALC, TRIG, CHOLHDL, LDLDIRECT in the last 72 hours. Thyroid Function Tests: No results for  input(s): TSH, T4TOTAL, FREET4, T3FREE, THYROIDAB in the last 72 hours. Anemia Panel: No results for input(s): VITAMINB12, FOLATE, FERRITIN, TIBC, IRON, RETICCTPCT in the last 72 hours. Urine analysis:    Component Value Date/Time   COLORURINE YELLOW 08/08/2018 1402   APPEARANCEUR CLEAR 08/08/2018 1402   LABSPEC 1.017 08/08/2018 1402   PHURINE 5.0 08/08/2018 1402   GLUCOSEU NEGATIVE 08/08/2018 1402   HGBUR MODERATE (A) 08/08/2018 1402   HGBUR negative 03/31/2009 1047   BILIRUBINUR NEGATIVE 08/08/2018 [place] 08/08/2018 1402   PROTEINUR NEGATIVE 08/08/2018 1402  UROBILINOGEN 0.2 08/28/2012 0543   NITRITE NEGATIVE 08/08/2018 1402   LEUKOCYTESUR TRACE (A) 08/08/2018 1402   Sepsis Labs: @LABRCNTIP (procalcitonin:4,lacticidven:4)  ) Recent Results (from the past 240 hour(s))  MRSA PCR Screening     Status: Abnormal   Collection Time: 08/11/18  7:55 AM  Result Value Ref Range Status   MRSA by PCR POSITIVE (A) NEGATIVE Final    Comment:        The GeneXpert MRSA Assay (FDA approved for NASAL specimens only), is one component of a comprehensive MRSA colonization surveillance program. It is not intended to diagnose MRSA infection nor to guide or monitor treatment for MRSA infections. RESULT CALLED TO, READ BACK BY AND VERIFIED WITH: K. Duffy RN 11:35 08/11/18 ([email or web address]) Performed at [facility], [place] [address], [place], [place] [address]          Radiology Studies: No results found.      Scheduled Meds: . acetaminophen  650 mg Oral Once  . calcium carbonate  1 tablet Oral Daily  . Chlorhexidine Gluconate Cloth  6 each Topical Q0600  . collagenase   Topical Daily  . diclofenac sodium  4 g Topical QID  . furosemide  20 mg Intravenous Once  . insulin aspart  0-5 Units Subcutaneous QHS  . insulin aspart  0-9 Units Subcutaneous TID WC  . leflunomide  20 mg Oral Daily  . levothyroxine  100 mcg Oral Q0600  . mouth rinse  15 mL Mouth Rinse BID   . mirabegron ER  25 mg Oral Daily  . mirtazapine  7.5 mg Oral QHS  . mupirocin ointment  1 application Nasal BID  . pravastatin  20 mg Oral q1800   Continuous Infusions: . sodium chloride 75 mL/hr at 08/11/18 0330     LOS: 3 days    Time spent: 25 minutes    Cristal Deer, MD [facility] Pager [phone]  If 7PM-7AM, please contact night-coverage [email or web address] Password [facility] 08/11/2018, 6:43 PM

## 2018-08-11 NOTE — Evaluation (Signed)
Clinical/Bedside Swallow Evaluation Patient Details  Name: [name] MRN: [id number] Date of Birth: 03/07/1929  Today's Date: 08/11/2018 Time: SLP Start Time (ACUTE ONLY): 1001 SLP Stop Time (ACUTE ONLY): 1021 SLP Time Calculation (min) (ACUTE ONLY): 20 min  Past Medical History:  Past Medical History:  Diagnosis Date  . Acute bronchitis   . Anemia, unspecified   . Atrial fibrillation ([place])    not on AC due to falls  . Depressive disorder, not elsewhere classified   . Diverticulosis of colon (without mention of hemorrhage)   . Esophageal reflux   . Family history of malignant neoplasm of gastrointestinal tract   . Functional diarrhea   . Hiatal hernia   . History of blood transfusion    "once; related to diverticulitis" (11/29/2017)  . Other and unspecified hyperlipidemia   . Other chest pain   . Other specified cardiac dysrhythmias(427.89)   . Personal history of colonic polyps 07/17/1995   adenomatous polyps  . PONV (postoperative nausea and vomiting)   . Recurrent UTI (urinary tract infection)    "2-3 times in the last 1 1/15yr" (11/29/2017)  . Rheumatoid arthritis ([place])   . Tachycardia, unspecified   . Type II diabetes mellitus ([place])   . Unspecified adverse effect of unspecified drug, medicinal and biological substance   . Unspecified essential hypertension   . Unspecified hypothyroidism    Past Surgical History:  Past Surgical History:  Procedure Laterality Date  . ABDOMINAL HYSTERECTOMY     "partial"  . CATARACT EXTRACTION Bilateral   . FOREARM FRACTURE SURGERY Right 1940s?  . FRACTURE SURGERY    . ORIF FEMUR FRACTURE  08/30/2012   Procedure: OPEN REDUCTION INTERNAL FIXATION (ORIF) DISTAL FEMUR FRACTURE;  Surgeon: Mauri Pole, MD;  Location: [facility];  Service: Orthopedics;  Laterality: Right;  proximal femur  . THYROIDECTOMY, PARTIAL     HPI:  Pt is an 84 year old female who was brought to the ER with weakness. CXR and CT Head negative. Pt was found to  have GI bleed. Prior BSE 06/04/18 recommended Dys 2 diet and thin liquids given cognitive-linguistic impairments and recently broken dentures. PMH includes: dementia, rheumatoid arthritis, GERD,  atrial fibrillation, diabetes mellitus   Assessment / Plan / Recommendation Clinical Impression  [name] oropharyngeal swallow appears functional. She has previously been on chopped foods due to missing dentures, but she has a full set of dentures in her mouth now. Although mildly prolonged mastication is noted, pt and caregiver pick foods that they know pt can masticate. Recommend continuing with regular diet textures and thin liquids. SLP to sign off. SLP Visit Diagnosis: Dysphagia, unspecified (R13.10)    Aspiration Risk  Mild aspiration risk    Diet Recommendation Regular;Thin liquid   Liquid Administration via: Cup;Straw Medication Administration: Whole meds with liquid Supervision: Staff to assist with self feeding Compensations: Slow rate;Small sips/bites Postural Changes: Seated upright at 90 degrees    Other  Recommendations Oral Care Recommendations: Oral care BID   Follow up Recommendations 24 hour supervision/assistance      Frequency and Duration            Prognosis        Swallow Study   General HPI: Pt is an 84 year old female who was brought to the ER with weakness. CXR and CT Head negative. Pt was found to have GI bleed. Prior BSE 06/04/18 recommended Dys 2 diet and thin liquids given cognitive-linguistic impairments and recently broken dentures. PMH includes: dementia, rheumatoid arthritis, GERD,  atrial  fibrillation, diabetes mellitus Type of Study: Bedside Swallow Evaluation Previous Swallow Assessment: see HPI Diet Prior to this Study: Regular;Thin liquids Temperature Spikes Noted: No Respiratory Status: Room air History of Recent Intubation: No Behavior/Cognition: Alert;Cooperative Oral Care Completed by SLP: No Oral Cavity - Dentition: Dentures, top;Dentures,  bottom Vision: Functional for self-feeding Self-Feeding Abilities: Needs assist;Needs set up Patient Positioning: Upright in bed Baseline Vocal Quality: Normal    Oral/Motor/Sensory Function     Ice Chips Ice chips: Not tested   Thin Liquid Thin Liquid: Within functional limits Presentation: Cup;Self Fed;Straw    Nectar Thick Nectar Thick Liquid: Not tested   Honey Thick Honey Thick Liquid: Not tested   Puree Puree: Not tested   Solid     Solid: Within functional limits      Germain Osgood 08/11/2018,10:32 AM  Germain Osgood, M.A. [place] Acute Environmental education officer [phone] Office [phone]

## 2018-08-11 NOTE — Progress Notes (Deleted)
Hypoglycemic Event  CBG: 62  Treatment: D50 IV 25 mL  Symptoms: None  Follow-up CBG: Time:2245 CBG Result:123  Possible Reasons for Event: Inadequate meal intake  Comments/MD notified:Yes    Furqan Gosselin Donney Dice

## 2018-08-11 NOTE — Consult Note (Addendum)
[facility] Nurse wound consult note Reason for Consult: Consult requested for right toe.  Pt states she is followed by the outpatient wound care center and has serial debridements performed.  Wound type: Right 2nd toe with dry scabbed full thickness wound to anterior site Measurement: 1X1cm, dry yellow woundbed, no odor or drainage or fluctuance. Planter great toe with dry intact callous; .3X.3cm without odor, drainage, or fluctuance Left inner foot with dry intact callous; .8X.8cm without odor, drainage, or fluctuance Dressing procedure/placement/frequency: Santyl ointment to provide enzymatic debridement of nonviable tissue to right second toe.  Pt can resume followup with the outpatient wound care center after discharge. Please re-consult if further assistance is needed.  Thank-you,  Julien Girt MSN, [place], [place], [place], [place]

## 2018-08-12 LAB — TYPE AND SCREEN
ABO/RH(D): O POS
Antibody Screen: NEGATIVE
UNIT DIVISION: 0
Unit division: 0
Unit division: 0
Unit division: 0

## 2018-08-12 LAB — BPAM RBC
BLOOD PRODUCT EXPIRATION DATE: 201912132359
Blood Product Expiration Date: 201912112359
Blood Product Expiration Date: 201912112359
Blood Product Expiration Date: 201912162359
ISSUE DATE / TIME: 201911152237
ISSUE DATE / TIME: 201911170205
ISSUE DATE / TIME: 201911170500
UNIT TYPE AND RH: 5100
UNIT TYPE AND RH: 5100
Unit Type and Rh: 5100
Unit Type and Rh: 5100

## 2018-08-12 LAB — CBC WITH DIFFERENTIAL/PLATELET
Abs Immature Granulocytes: 0.05 10*3/uL (ref 0.00–0.07)
BASOS ABS: 0 10*3/uL (ref 0.0–0.1)
BASOS PCT: 0 %
EOS ABS: 0.2 10*3/uL (ref 0.0–0.5)
Eosinophils Relative: 2 %
HEMATOCRIT: 35.8 % — AB (ref 36.0–46.0)
Hemoglobin: 11.4 g/dL — ABNORMAL LOW (ref 12.0–15.0)
IMMATURE GRANULOCYTES: 1 %
LYMPHS ABS: 0.8 10*3/uL (ref 0.7–4.0)
Lymphocytes Relative: 10 %
MCH: 26.8 pg (ref 26.0–34.0)
MCHC: 31.8 g/dL (ref 30.0–36.0)
MCV: 84.2 fL (ref 80.0–100.0)
Monocytes Absolute: 0.6 10*3/uL (ref 0.1–1.0)
Monocytes Relative: 8 %
NEUTROS PCT: 79 %
NRBC: 0 % (ref 0.0–0.2)
Neutro Abs: 6.3 10*3/uL (ref 1.7–7.7)
PLATELETS: 238 10*3/uL (ref 150–400)
RBC: 4.25 MIL/uL (ref 3.87–5.11)
RDW: 16.9 % — ABNORMAL HIGH (ref 11.5–15.5)
WBC: 7.9 10*3/uL (ref 4.0–10.5)

## 2018-08-12 LAB — GLUCOSE, CAPILLARY
GLUCOSE-CAPILLARY: 134 mg/dL — AB (ref 70–99)
Glucose-Capillary: 190 mg/dL — ABNORMAL HIGH (ref 70–99)
Glucose-Capillary: 235 mg/dL — ABNORMAL HIGH (ref 70–99)
Glucose-Capillary: 134 mg/dL — ABNORMAL HIGH (ref 70–99)

## 2018-08-12 MED ORDER — PANTOPRAZOLE SODIUM 40 MG PO TBEC
40.0000 mg | DELAYED_RELEASE_TABLET | Freq: Two times a day (BID) | ORAL | Status: DC
  Administered 2018-08-12 – 2018-08-14 (×4): 40 mg via ORAL
  Filled 2018-08-12 (×4): qty 1

## 2018-08-12 NOTE — Evaluation (Signed)
Physical Therapy Evaluation Patient Details Name: [name] MRN: [id number] DOB: 24-Apr-1929 Today's Date: 08/12/2018   History of Present Illness  pt is an 84 y/o female with h/o dementia, RA, GERD, GIB, afib, DM and diverticulosis, admitted through ED with weakness, listing to side and found to have 2 bloody stools.  GI consulted.  2 units of blood given.  Clinical Impression  Pt has been in a recent functional decline, but caregivers have been increasing assist.  [name] caregiver stated she would be able to handle present assist needs.  So, pt should be safe at home with caregivers, but needs HHPT to see if pt could improve strength and burden of care could be decreased. There are no further acute PT needs.  Will sign off at this time.     Follow Up Recommendations Home health PT;Supervision/Assistance - 24 hour    Equipment Recommendations  None recommended by PT    Recommendations for Other Services       Precautions / Restrictions Precautions Precautions: Fall      Mobility  Bed Mobility Overal bed mobility: Needs Assistance Bed Mobility: Supine to Sit     Supine to sit: Mod assist;HOB elevated     General bed mobility comments: pt resistant to help given, but when wanted to move, need mod cues and assist  Transfers Overall transfer level: Needs assistance   Transfers: Sit to/from Stand;Squat Pivot Transfers Sit to Stand: Max assist   Squat pivot transfers: Max assist     General transfer comment: pt did not fully extend her knees to accept weight for transfer, but did push up with both hands and grab to therapists arms to assist  Ambulation/Gait             General Gait Details: unable today, recently has not been walking  Stairs            Wheelchair Mobility    Modified Rankin (Stroke Patients Only)       Balance Overall balance assessment: Needs assistance Sitting-balance support: No upper extremity supported;Single extremity  supported Sitting balance-Leahy Scale: Fair       Standing balance-Leahy Scale: Poor                               Pertinent Vitals/Pain Pain Assessment: Faces Faces Pain Scale: No hurt    Home Living Family/patient expects to be discharged to:: Private residence Living Arrangements: Alone Available Help at Discharge: Personal care attendant(24/7 in 3 shifts) Type of Home: House Home Access: Stairs to enter Entrance Stairs-Rails: None Entrance Stairs-Number of Steps: 2 Home Layout: One level;Able to live on main level with bedroom/bathroom Home Equipment: Walker - 4 wheels;Wheelchair - manual;Shower seat;Bedside commode      Prior Function Level of Independence: Needs assistance   Gait / Transfers Assistance Needed: pivot to the chair  ADL's / Homemaking Assistance Needed: caregiver reports assist with bathing and dressing at baseline. wheeled to bathroom and pivots to toilet. with assist        Hand Dominance   Dominant Hand: Right    Extremity/Trunk Assessment        Lower Extremity Assessment Lower Extremity Assessment: Generalized weakness       Communication   Communication: HOH  Cognition Arousal/Alertness: Awake/alert Behavior During Therapy: Restless(mildly agitated) Overall Cognitive Status: History of cognitive impairments - at baseline  General Comments      Exercises     Assessment/Plan    PT Assessment All further PT needs can be met in the next venue of care  PT Problem List Decreased strength;Decreased activity tolerance;Decreased mobility;Decreased balance       PT Treatment Interventions      PT Goals (Current goals can be found in the Care Plan section)  Acute Rehab PT Goals Patient Stated Goal: pt unable to participate PT Goal Formulation: All assessment and education complete, DC therapy    Frequency     Barriers to discharge        Co-evaluation                AM-PAC PT "6 Clicks" Daily Activity  Outcome Measure Difficulty turning over in bed (including adjusting bedclothes, sheets and blankets)?: Unable Difficulty moving from lying on back to sitting on the side of the bed? : Unable Difficulty sitting down on and standing up from a chair with arms (e.g., wheelchair, bedside commode, etc,.)?: Unable Help needed moving to and from a bed to chair (including a wheelchair)?: A Little Help needed walking in hospital room?: A Lot Help needed climbing 3-5 steps with a railing? : A Lot 6 Click Score: 10    End of Session   Activity Tolerance: Patient tolerated treatment well;Treatment limited secondary to agitation Patient left: in chair;with call bell/phone within reach;with nursing/sitter in room Nurse Communication: Mobility status PT Visit Diagnosis: Muscle weakness (generalized) (M62.81);Difficulty in walking, not elsewhere classified (R26.2)    Time: 1017-1050 PT Time Calculation (min) (ACUTE ONLY): 33 min   Charges:   PT Evaluation $PT Eval Moderate Complexity: 1 Mod PT Treatments $Therapeutic Activity: 8-22 mins        08/12/2018  Donnella Sham, PT Acute Rehabilitation Services [phone]  (pager) [phone]  (office)  Tessie Fass Ayssa Bentivegna 08/12/2018, 1:02 PM

## 2018-08-12 NOTE — Consult Note (Signed)
   [name] CM Inpatient Consult   08/12/2018  [name] 07-19-1929 [id number]  Patient screened for high risk score [employer] services. Chart review reveals that the patient has been hospitalized 4 times in the past 6 months.  She lives at home and has personal caregivers. Spoke with inpatient RNCM regarding any needs. Will follow up.  For questions contact:   Natividad Brood, RN BSN [facility] Liaison  [phone] business mobile phone Toll free office [phone]

## 2018-08-12 NOTE — Progress Notes (Signed)
PROGRESS NOTE    [name]  FGH:[id number] DOB: 1928/12/23 DOA: 08/08/2018 PCP: Deland Pretty, MD  Outpatient Specialists:     Brief Narrative:  [name] is an 84 year old female with history of dementia rheumatoid arthritis on prednisone GERD colonic polyp GI bleed in 2014 found to be due to diverticulosis, atrial fibrillation not on any anticoagulation due to GI bleed, diabetes mellitus, who was brought to emergency room for evaluation because patient appeared weak and leaning to one side. In the emergency department she was found to have 2 bloody stools.   Assessment & Plan:   Active Problems:   GI bleed   1.  GI bleed GI consult appreciated.  GI advised for conservative management due to her advanced dementia and multiple comorbid condition.  We will not be doing any endoscopic procedure.  Was on IV Protonix. change to p.o.  2.  Advanced dementia continue Remeron 3.  Anemia  stable. 11.4 today.  Will order a.m. labs. If there is drop in hemoglobin or brisk bleeding will consider RBC scan/CTA per recommendation of gastroenterologist.  Gastroenterologist has signed off. Continue ferrous sulfate iron replacement. 4.  Rheumatoid arthritis patient takes prednisone prednisone was on hold due to GI bleed. Restart her prednisone. 5.  Type 2 diabetes mellitus oral hypoglycemics were on hold due to being n.p.o. for possible EGD.  Restart her oral  hypoglycemics since the EGD is not going to be done , metformin and Tradjenta 6.  Hypocalcemia patient is not on any calcium. Start calcium replacement calcium is 7.8 7.  Hypothyroidism continue levothyroxine 8.  Hypertension: resume beta-blocker since she is now able to take by mouth 9.  Physical therapy consult.  Patient more confused than her baseline with increased irritability.  However, has 24/7 caregivers.  Likely discharge in the morning  DVT prophylaxis: SCD Code Status:DNR  Family Communication: Unable to contact  family Disposition Plan: Discharge home when stable.  Anticipate discharge tomorrow.  Consultants:   Gastroenterologist, Dr. Lyndel Safe  Procedures:   None  Antimicrobials:  None  Subjective: Patient seen at bedside.  Confused.  She has dementia  August 10, 2018:   Patient seen at bedside.  She has not had any more GI bleed.  Gastroenterology has signed off the advised to continue conservative management due to her dementia patient has dropped her hemoglobin to 6.6 today. Given blood transfusion x2 units  August 11, 2018: Patient received 2 units of packed RBC yesterday. denies any complaints except the night she had a bit of agitation requiring as needed sedation  08/12/18: -She remains confused.  Hemoglobin stable.   Objective: Vitals:   08/11/18 1613 08/11/18 2053 08/12/18 0522 08/12/18 1501  BP: (!) 129/98 138/83 (!) 169/65 (!) 144/95  Pulse: (!) 59 (!) 45 77 86  Resp:  18 18 18   Temp: 98.1 F (36.7 C) 98.2 F (36.8 C) 98.1 F (36.7 C) 98.2 F (36.8 C)  TempSrc: Axillary Oral Oral Oral  SpO2: 93% 97% 97% 97%  Weight:      Height:        Intake/Output Summary (Last 24 hours) at 08/12/2018 1655 Last data filed at 08/12/2018 0900 Gross per 24 hour  Intake 1160 ml  Output 700 ml  Net 460 ml   Filed Weights   08/08/18 1858  Weight: 43.5 kg    Examination:  General exam: Frail-appearing female, confused, not in any acute distress at this time.   Respiratory system: Clear to auscultation. Respiratory effort normal. Cardiovascular system:  S1 & S2 heard, RRR. No JVD, murmurs, rubs, gallops or clicks. No pedal edema. Gastrointestinal system: Abdomen is nondistended, soft and nontender. No organomegaly or masses felt. Normal bowel sounds heard. Central nervous system: Alert and oriented x1. No focal neurological deficits. Extremities: Symmetric 5 x 5 power.  No edema Skin: No rashes, Right 2nd toe with dry scabbed full thickness wound lesions or  ulcers Psychiatry: Judgement and insight not intact.Patient has dementia    Data Reviewed: I have personally reviewed following labs and imaging studies  CBC: Recent Labs  Lab 08/08/18 1120 08/08/18 1528  08/09/18 0649  08/10/18 1031 08/10/18 1610 08/10/18 2226 08/11/18 0549 08/11/18 1207 08/12/18 0433  WBC 8.6 8.1  --  9.6  --  10.7*  --   --   --   --  7.9  NEUTROABS 6.2  --   --   --   --  8.6*  --   --   --   --  6.3  HGB 10.5* 10.3*   < > 7.8*   < > 10.7* 10.3* 9.9* 10.5* 10.1* 11.4*  HCT 34.5* 34.9*   < > 26.5*   < > 35.4* 32.7* 32.7* 34.0* 31.5* 35.8*  MCV 87.1 89.0  --  87.7  --  86.8  --   --   --   --  84.2  PLT 291 306  --  296  --  234  --   --   --   --  238   < > = values in this interval not displayed.   Basic Metabolic Panel: Recent Labs  Lab 08/08/18 1120 08/09/18 0649  NA 139 138  K 3.8 3.8  CL 104 108  CO2 26 21*  GLUCOSE 173* 169*  BUN 21 14  CREATININE 0.73 0.76  CALCIUM 8.8* 7.7*   GFR: Estimated Creatinine Clearance: 34 mL/min (by C-G formula based on SCr of 0.76 mg/dL). Liver Function Tests: Recent Labs  Lab 08/08/18 1120  AST 24  ALT 14  ALKPHOS 61  BILITOT 0.7  PROT 6.1*  ALBUMIN 3.1*   No results for input(s): LIPASE, AMYLASE in the last 168 hours. No results for input(s): AMMONIA in the last 168 hours. Coagulation Profile: No results for input(s): INR, PROTIME in the last 168 hours. Cardiac Enzymes: No results for input(s): CKTOTAL, CKMB, CKMBINDEX, TROPONINI in the last 168 hours. BNP (last 3 results) No results for input(s): PROBNP in the last 8760 hours. HbA1C: No results for input(s): HGBA1C in the last 72 hours. CBG: Recent Labs  Lab 08/11/18 1219 08/11/18 1733 08/11/18 2049 08/12/18 0812 08/12/18 1229  GLUCAP 136* 108* 140* 134* 190*   Lipid Profile: No results for input(s): CHOL, HDL, LDLCALC, TRIG, CHOLHDL, LDLDIRECT in the last 72 hours. Thyroid Function Tests: No results for input(s): TSH, T4TOTAL,  FREET4, T3FREE, THYROIDAB in the last 72 hours. Anemia Panel: No results for input(s): VITAMINB12, FOLATE, FERRITIN, TIBC, IRON, RETICCTPCT in the last 72 hours. Urine analysis:    Component Value Date/Time   COLORURINE YELLOW 08/08/2018 1402   APPEARANCEUR CLEAR 08/08/2018 1402   LABSPEC 1.017 08/08/2018 1402   PHURINE 5.0 08/08/2018 1402   GLUCOSEU NEGATIVE 08/08/2018 1402   HGBUR MODERATE (A) 08/08/2018 1402   HGBUR negative 03/31/2009 1047   BILIRUBINUR NEGATIVE 08/08/2018 1402   KETONESUR NEGATIVE 08/08/2018 1402   PROTEINUR NEGATIVE 08/08/2018 1402   UROBILINOGEN 0.2 08/28/2012 0543   NITRITE NEGATIVE 08/08/2018 1402   LEUKOCYTESUR TRACE (A) 08/08/2018 1402  Sepsis Labs: @LABRCNTIP (procalcitonin:4,lacticidven:4)  ) Recent Results (from the past 240 hour(s))  MRSA PCR Screening     Status: Abnormal   Collection Time: 08/11/18  7:55 AM  Result Value Ref Range Status   MRSA by PCR POSITIVE (A) NEGATIVE Final    Comment:        The GeneXpert MRSA Assay (FDA approved for NASAL specimens only), is one component of a comprehensive MRSA colonization surveillance program. It is not intended to diagnose MRSA infection nor to guide or monitor treatment for MRSA infections. RESULT CALLED TO, READ BACK BY AND VERIFIED WITH: K. Duffy RN 11:35 08/11/18 ([email or web address]) Performed at [facility], [place] [address] [place], [place] [address]          Radiology Studies: No results found.      Scheduled Meds: . acetaminophen  650 mg Oral Once  . calcium carbonate  1 tablet Oral Daily  . Chlorhexidine Gluconate Cloth  6 each Topical Q0600  . collagenase   Topical Daily  . diclofenac sodium  4 g Topical QID  . furosemide  20 mg Intravenous Once  . insulin aspart  0-5 Units Subcutaneous QHS  . insulin aspart  0-9 Units Subcutaneous TID WC  . leflunomide  20 mg Oral Daily  . levothyroxine  100 mcg Oral Q0600  . mouth rinse  15 mL Mouth Rinse BID  . mirabegron ER  25  mg Oral Daily  . mirtazapine  7.5 mg Oral QHS  . mupirocin ointment  1 application Nasal BID  . pantoprazole  40 mg Oral BID  . pravastatin  20 mg Oral q1800  . predniSONE  5 mg Oral Q breakfast   Continuous Infusions: . sodium chloride 75 mL/hr at 08/11/18 0330     LOS: 4 days    Time spent: 25 minutes    Haydan Wedig, MD [facility] Pager [phone]  If 7PM-7AM, please contact night-coverage [email or web address] Password [email or web address] 08/12/2018, 4:55 PM

## 2018-08-12 NOTE — Care Management Note (Addendum)
Case Management Note  Patient Details  Name: [name] MRN: [id number] Date of Birth: 1929-08-09  Subjective/Objective:     GI BLEED. Hx of  dementia rheumatoid arthritis,GERD, colonic polyp GI bleed in 2014 found to be due to diverticulosis, atrial fibrillation, gi bleed, diabetes mellitus.   1st [place], pt received 12hr/day of  Nurse aide services PTA and  received  Home health services from [facility] (PT,RN).  [name] ([address] [place] Cao [phone]     PCP: Deland Pretty  Action/Plan: NCM following for TOC needs.... Per [name] evaluation :   Home health PT;Supervision/Assistance - 24 hour   NCM shared evaluation with son,Gary. Dominica Severin stated they would be able to provide mom with 1 week 24/7 supervision/ assistance @ d/c with the hope she will be back to her baseline @ that time.   Expected Discharge Date:                  Expected Discharge Plan:  [place]  In-House Referral:     Discharge planning Services  CM Consult  Post Acute Care Choice:  Resumption of Svcs/PTA Provider Choice offered to:  Adult Children  DME Arranged:  N/A(owns wheelchair, BSC, showerseat) DME Agency:  NA  HH Arranged:  PT, RN [place] Agency:  [facility] Case ended with [facility] per pt/son request and [facility] was selected to provide home health services.  Status of Service:  completed  If discussed at Long Length of Stay Meetings, dates discussed:    Additional Comments:  Sharin Mons, RN 08/12/2018, 3:59 PM

## 2018-08-13 DIAGNOSIS — K922 Gastrointestinal hemorrhage, unspecified: Secondary | ICD-10-CM

## 2018-08-13 LAB — BASIC METABOLIC PANEL
Anion gap: 13 (ref 5–15)
CHLORIDE: 105 mmol/L (ref 98–111)
CO2: 23 mmol/L (ref 22–32)
CREATININE: 0.62 mg/dL (ref 0.44–1.00)
Calcium: 7.6 mg/dL — ABNORMAL LOW (ref 8.9–10.3)
GFR calc Af Amer: >60 mL/min (ref >60)
GFR calc non Af Amer: >60 mL/min (ref >60)
GLUCOSE: 158 mg/dL — AB (ref 70–99)
Potassium: 2.4 mmol/L — CL (ref 3.5–5.1)
Sodium: 141 mmol/L (ref 135–145)

## 2018-08-13 LAB — CBC
HEMATOCRIT: 39.7 % (ref 36.0–46.0)
HEMOGLOBIN: 12.4 g/dL (ref 12.0–15.0)
MCH: 26.2 pg (ref 26.0–34.0)
MCHC: 31.2 g/dL (ref 30.0–36.0)
MCV: 83.8 fL (ref 80.0–100.0)
Platelets: 225 10*3/uL (ref 150–400)
RBC: 4.74 MIL/uL (ref 3.87–5.11)
RDW: 16.8 % — AB (ref 11.5–15.5)
WBC: 8.5 10*3/uL (ref 4.0–10.5)
nRBC: 0 % (ref 0.0–0.2)

## 2018-08-13 LAB — GLUCOSE, CAPILLARY
GLUCOSE-CAPILLARY: 206 mg/dL — AB (ref 70–99)
Glucose-Capillary: 163 mg/dL — ABNORMAL HIGH (ref 70–99)
Glucose-Capillary: 153 mg/dL — ABNORMAL HIGH (ref 70–99)
Glucose-Capillary: 197 mg/dL — ABNORMAL HIGH (ref 70–99)

## 2018-08-13 LAB — MAGNESIUM: Magnesium: 1.2 mg/dL — ABNORMAL LOW (ref 1.7–2.4)

## 2018-08-13 MED ORDER — MAGNESIUM SULFATE 2 GM/50ML IV SOLN
2.0000 g | Freq: Once | INTRAVENOUS | Status: AC
  Administered 2018-08-13: 2 g via INTRAVENOUS
  Filled 2018-08-13: qty 50

## 2018-08-13 MED ORDER — HALOPERIDOL LACTATE 5 MG/ML IJ SOLN
2.5000 mg | Freq: Once | INTRAMUSCULAR | Status: AC
  Administered 2018-08-13: 2.5 mg via INTRAVENOUS
  Filled 2018-08-13: qty 1

## 2018-08-13 MED ORDER — POTASSIUM CHLORIDE CRYS ER 20 MEQ PO TBCR
40.0000 meq | EXTENDED_RELEASE_TABLET | Freq: Once | ORAL | Status: AC
  Administered 2018-08-13: 40 meq via ORAL
  Filled 2018-08-13: qty 2

## 2018-08-13 MED ORDER — POTASSIUM CHLORIDE CRYS ER 20 MEQ PO TBCR: 40.0000 meq | EXTENDED_RELEASE_TABLET | Freq: Once | ORAL | Status: AC

## 2018-08-13 NOTE — Progress Notes (Signed)
Pt very confused and agitated, attempting to get out of bed. Unable to ambulate due to weakness. RN attempted to reorient pt and redirect all attempts unsuccessful. NP on call paged. New order for Haldol obtained and administered. Will continue to monitor and treat pt.

## 2018-08-13 NOTE — Progress Notes (Signed)
Patient Demographics:    [name], is a 84 y.o. female, DOB - 1928-11-14, UKG:[id number]  Admit date - 08/08/2018   Admitting Physician Guilford Shi, MD  Outpatient Primary MD for the patient is Deland Pretty, MD  LOS - 5   Chief Complaint  Patient presents with  . Weakness        Subjective:    [name] today has no fevers, no emesis,  No chest pain, her intake is not great, caregiver at bedside,  Assessment  & Plan :    Active Problems:   GI bleed  Brief Narrative:  [name] is an 84 year old female with history of dementia rheumatoid arthritis on prednisone GERD colonic polyp GI bleed in 2014 found to be due to diverticulosis, atrial fibrillation not on any anticoagulation due to GI bleed, diabetes mellitus, who was brought to emergency room for evaluation because patient appeared weak and leaning to one side. In the emergency department she was found to have 2 bloody stools.   Assessment & Plan:   Active Problems:   GI bleed   1.  GI bleed GI consult appreciated.  GI advised for conservative management due to her advanced dementia and multiple comorbid condition.  No endoluminal evaluation undertaken, continue oral Protonix, H&H is stable  2.  Advanced dementia-- continue Remeron  3.  Anemia  stable.--- Hgb above 11.  , If there is drop in hemoglobin or brisk bleeding will consider RBC scan/CTA per recommendation of gastroenterologist.  Gastroenterologist has signed off. Continue ferrous sulfate iron replacement.  4.  Rheumatoid arthritis patient takes prednisone prednisone was on hold due to GI bleed. Restart her prednisone, . 5.  Type 2 diabetes mellitus--- okay to restart metformin and Tradjenta  6.  Hypocalcemia --- continue calcium replacement,  calcium is 7.8 7.  Hypothyroidism - continue levothyroxine  8.  Hypertension: Stable, resume  beta-blocker since she is now able to take by mouth  9.  Physical therapy consult.  Patient more confused than her baseline with increased irritability.  However, has 24/7 caregivers.    10) electrolyte abnormalities--- hold discharge as magnesium is 1.2,  potassium is 2.4, replaced IV and orally, repeat electrolytes in a.m.   DVT prophylaxis: SCD Code Status:DNR  Family Communication: Unable to contact family--tried calling Mr [name] ([phone])---left message   Consultants:   Gastroenterologist, Dr. Lyndel Safe   Disposition/Need for in-Hospital Stay-patient has 24/7 care at home, patient unable to be discharged at this time due to significant hypomagnesemia and hypokalemia requiring IV and oral replacement. hold discharge as magnesium is 1.2,  potassium is 2.4, replaced IV and orally, repeat electrolytes in a.m.   Code Status : Full   Disposition Plan  : patient has 24/7 care at home,  Consults  :  Phy therapy/Gi   DVT Prophylaxis  :  SCDs *   Lab Results  Component Value Date   PLT 225 08/13/2018    Inpatient Medications  Scheduled Meds: . acetaminophen  650 mg Oral Once  . calcium carbonate  1 tablet Oral Daily  . Chlorhexidine Gluconate Cloth  6 each Topical Q0600  . collagenase   Topical Daily  . diclofenac sodium  4 g Topical QID  . furosemide  20 mg  Intravenous Once  . insulin aspart  0-5 Units Subcutaneous QHS  . insulin aspart  0-9 Units Subcutaneous TID WC  . leflunomide  20 mg Oral Daily  . levothyroxine  100 mcg Oral Q0600  . mouth rinse  15 mL Mouth Rinse BID  . mirabegron ER  25 mg Oral Daily  . mirtazapine  7.5 mg Oral QHS  . mupirocin ointment  1 application Nasal BID  . pantoprazole  40 mg Oral BID  . potassium chloride  40 mEq Oral Once  . pravastatin  20 mg Oral q1800  . predniSONE  5 mg Oral Q breakfast   Continuous Infusions: . sodium chloride 75 mL/hr at 08/13/18 1240  . magnesium sulfate 1 - 4 g bolus IVPB     PRN  Meds:.acetaminophen **OR** acetaminophen, benzonatate, hydrALAZINE, ondansetron **OR** ondansetron (ZOFRAN) IV    Anti-infectives (From admission, onward)   None        Objective:   Vitals:   08/12/18 2137 08/12/18 2303 08/13/18 0633 08/13/18 1605  BP: (!) 160/95  (!) 150/85 (!) 142/56  Pulse: 92  68 64  Resp: 16   16  Temp: 98 F (36.7 C) 98 F (36.7 C) (!) 97.5 F (36.4 C) 97.6 F (36.4 C)  TempSrc: Oral Oral Oral   SpO2: 98%  100% 100%  Weight:      Height:        Wt Readings from Last 3 Encounters:  08/08/18 43.5 kg  06/04/18 42.3 kg  04/25/18 44.1 kg     Intake/Output Summary (Last 24 hours) at 08/13/2018 1748 Last data filed at 08/13/2018 2353 Gross per 24 hour  Intake -  Output 700 ml  Net -700 ml     Physical Exam Patient is examined daily including today on 08/13/18 , exams remain the same as of yesterday except that has changed   Gen:-  lethargic HEENT:- Ong.AT, No sclera icterus Neck-Supple Neck,No JVD,.  Lungs-  CTAB , fair symmetrical air movement CV- S1, S2 normal, regular  Abd-  +ve B.Sounds, Abd Soft, No tenderness,    Extremity/Skin:- No  edema, pedal pulses present  Psych-affect is flat with cognitive deficits  Neuro-generalized weakness, no new focal deficits, no tremors   Data Review:   Micro Results Recent Results (from the past 240 hour(s))  MRSA PCR Screening     Status: Abnormal   Collection Time: 08/11/18  7:55 AM  Result Value Ref Range Status   MRSA by PCR POSITIVE (A) NEGATIVE Final    Comment:        The GeneXpert MRSA Assay (FDA approved for NASAL specimens only), is one component of a comprehensive MRSA colonization surveillance program. It is not intended to diagnose MRSA infection nor to guide or monitor treatment for MRSA infections. RESULT CALLED TO, READ BACK BY AND VERIFIED WITH: K. Duffy RN 11:35 08/11/18 ([email or web address]) Performed at [facility], [place] [address] [place], [place] [address]      Radiology Reports Dg Chest 2 View  Result Date: 08/08/2018 CLINICAL DATA:  Weakness. EXAM: CHEST - 2 VIEW COMPARISON:  Chest x-ray 06/03/2018. FINDINGS: Mediastinum hilar structures normal. Mild bibasilar subsegmental atelectasis/scarring. No acute infiltrate. No pleural effusion or pneumothorax. Cardiomegaly with normal pulmonary vascularity. No acute bony abnormality. IMPRESSION: Cardiomegaly with normal pulmonary vascularity. Mild bibasilar subsegmental atelectasis/scarring. No acute infiltrate. Electronically Signed   By: Marcello Moores  Register   On: 08/08/2018 12:51   Ct Head Wo Contrast  Result Date: 08/08/2018 CLINICAL DATA:  Altered mental status, weaker today, leading to the RIGHT, history atrial fibrillation, type II diabetes mellitus, hypertension EXAM: CT HEAD WITHOUT CONTRAST TECHNIQUE: Contiguous axial images were obtained from the base of the skull through the vertex without intravenous contrast. COMPARISON:  06/03/2018 FINDINGS: Brain: Generalized atrophy. Normal ventricular morphology. No midline shift or mass effect. Small vessel chronic ischemic changes of deep cerebral white matter. No intracranial hemorrhage, mass lesion, evidence of acute infarction, or extra-axial fluid collection. Vascular: Atherosclerotic calcifications of internal carotid and vertebral arteries at skull base Skull: Demineralized but intact Sinuses/Orbits: Clear Other: N/A IMPRESSION: Atrophy with small vessel chronic ischemic changes of deep cerebral white matter. No acute intracranial abnormalities. Electronically Signed   By: Lavonia Dana M.D.   On: 08/08/2018 14:19     CBC Recent Labs  Lab 08/08/18 1120 08/08/18 1528  08/09/18 0649  08/10/18 1031  08/10/18 2226 08/11/18 0549 08/11/18 1207 08/12/18 0433 08/13/18 0639  WBC 8.6 8.1  --  9.6  --  10.7*  --   --   --   --  7.9 8.5  HGB 10.5* 10.3*   < > 7.8*   < > 10.7*   < > 9.9* 10.5* 10.1* 11.4* 12.4  HCT 34.5* 34.9*   < > 26.5*   < > 35.4*   < >  32.7* 34.0* 31.5* 35.8* 39.7  PLT 291 306  --  296  --  234  --   --   --   --  238 225  MCV 87.1 89.0  --  87.7  --  86.8  --   --   --   --  84.2 83.8  MCH 26.5 26.3  --  25.8*  --  26.2  --   --   --   --  26.8 26.2  MCHC 30.4 29.5*  --  29.4*  --  30.2  --   --   --   --  31.8 31.2  RDW 15.6* 15.9*  --  15.8*  --  16.0*  --   --   --   --  16.9* 16.8*  LYMPHSABS 1.4  --   --   --   --  1.0  --   --   --   --  0.8  --   MONOABS 0.8  --   --   --   --  0.9  --   --   --   --  0.6  --   EOSABS 0.1  --   --   --   --  0.1  --   --   --   --  0.2  --   BASOSABS 0.0  --   --   --   --  0.1  --   --   --   --  0.0  --    < > = values in this interval not displayed.    Chemistries  Recent Labs  Lab 08/08/18 1120 08/09/18 0649 08/13/18 0639  NA 139 138 141  K 3.8 3.8 2.4*  CL 104 108 105  CO2 26 21* 23  GLUCOSE 173* 169* 158*  BUN 21 14 <5*  CREATININE 0.73 0.76 0.62  CALCIUM 8.8* 7.7* 7.6*  MG  --   --  1.2*  AST 24  --   --   ALT 14  --   --   ALKPHOS 61  --   --   BILITOT 0.7  --   --    ------------------------------------------------------------------------------------------------------------------  No results for input(s): CHOL, HDL, LDLCALC, TRIG, CHOLHDL, LDLDIRECT in the last 72 hours.  Lab Results  Component Value Date   HGBA1C 6.4 (H) 04/26/2018   ------------------------------------------------------------------------------------------------------------------ No results for input(s): TSH, T4TOTAL, T3FREE, THYROIDAB in the last 72 hours.  Invalid input(s): FREET3 ------------------------------------------------------------------------------------------------------------------ No results for input(s): VITAMINB12, FOLATE, FERRITIN, TIBC, IRON, RETICCTPCT in the last 72 hours.  Coagulation profile No results for input(s): INR, PROTIME in the last 168 hours.  No results for input(s): DDIMER in the last 72 hours.  Cardiac Enzymes No results for input(s): CKMB,  TROPONINI, MYOGLOBIN in the last 168 hours.  Invalid input(s): CK ------------------------------------------------------------------------------------------------------------------ No results found for: BNP   Roxan Hockey M.D on 08/13/2018 at 5:48 PM  Pager---[phone] Go to [email or web address] - password [email or web address] for contact info  [facility] - Office  [phone]

## 2018-08-13 NOTE — Progress Notes (Signed)
Critical lab value of k 2.4 reported to provider at 08:17.

## 2018-08-14 LAB — BASIC METABOLIC PANEL
Anion gap: 7 (ref 5–15)
BUN: 6 mg/dL — AB (ref 8–23)
CALCIUM: 7.3 mg/dL — AB (ref 8.9–10.3)
CO2: 21 mmol/L — ABNORMAL LOW (ref 22–32)
Chloride: 113 mmol/L — ABNORMAL HIGH (ref 98–111)
Creatinine, Ser: 0.64 mg/dL (ref 0.44–1.00)
GLUCOSE: 166 mg/dL — AB (ref 70–99)
POTASSIUM: 3.6 mmol/L (ref 3.5–5.1)
Sodium: 141 mmol/L (ref 135–145)

## 2018-08-14 LAB — GLUCOSE, CAPILLARY
GLUCOSE-CAPILLARY: 136 mg/dL — AB (ref 70–99)
GLUCOSE-CAPILLARY: 197 mg/dL — AB (ref 70–99)
Glucose-Capillary: 186 mg/dL — ABNORMAL HIGH (ref 70–99)

## 2018-08-14 MED ORDER — POTASSIUM CHLORIDE CRYS ER 20 MEQ PO TBCR
40.0000 meq | EXTENDED_RELEASE_TABLET | Freq: Once | ORAL | Status: AC
  Administered 2018-08-14: 40 meq via ORAL
  Filled 2018-08-14: qty 2

## 2018-08-14 MED ORDER — ONDANSETRON HCL 4 MG PO TABS: 4.0000 mg | ORAL_TABLET | Freq: Four times a day (QID) | ORAL | 0 refills | Status: AC | PRN

## 2018-08-14 MED ORDER — COLLAGENASE 250 UNIT/GM EX OINT: TOPICAL_OINTMENT | Freq: Every day | CUTANEOUS | 0 refills | Status: AC

## 2018-08-14 MED ORDER — PANTOPRAZOLE SODIUM 40 MG PO TBEC: 40.0000 mg | DELAYED_RELEASE_TABLET | Freq: Two times a day (BID) | ORAL | 2 refills | Status: DC

## 2018-08-14 MED ORDER — ACETAMINOPHEN 325 MG PO TABS: 650.0000 mg | ORAL_TABLET | Freq: Four times a day (QID) | ORAL | 0 refills | Status: DC | PRN

## 2018-08-14 MED ORDER — CALCIUM CARBONATE ANTACID 500 MG PO CHEW: 1.0000 | CHEWABLE_TABLET | Freq: Two times a day (BID) | ORAL | 2 refills | Status: DC

## 2018-08-14 NOTE — Discharge Summary (Signed)
[name], is a 84 y.o. female  DOB 1929/08/30  MRN [id number].  Admission date:  08/08/2018  Admitting Physician  Guilford Shi, MD  Discharge Date:  08/14/2018   Primary MD  Deland Pretty, MD  Recommendations for primary care physician for things to follow:  1)Patient has generalized weakness/Anemia/Rheumatoid and needs wound care 2)Apply Santyl to right 2nd toe wound Q day, then cover with moist 2X2 and foam dressing.  (Change foam dressing Q 3 days or PRN soiling.) 3)Avoid ibuprofen/Advil/Aleve/Motrin/Goody Powders/Naproxen/BC powders/Meloxicam/Diclofenac/Indomethacin and other Nonsteroidal anti-inflammatory medications as these will make you more likely to bleed and can cause stomach ulcers, can also cause Kidney problems.  4)Fall Precautions    Admission Diagnosis  Acute GI bleeding [K92.2]   Discharge Diagnosis  Acute GI bleeding [K92.2]    Active Problems:   GI bleed      Past Medical History:  Diagnosis Date  . Acute bronchitis   . Anemia, unspecified   . Atrial fibrillation ([place])    not on AC due to falls  . Depressive disorder, not elsewhere classified   . Diverticulosis of colon (without mention of hemorrhage)   . Esophageal reflux   . Family history of malignant neoplasm of gastrointestinal tract   . Functional diarrhea   . Hiatal hernia   . History of blood transfusion    "once; related to diverticulitis" (11/29/2017)  . Other and unspecified hyperlipidemia   . Other chest pain   . Other specified cardiac dysrhythmias(427.89)   . Personal history of colonic polyps 07/17/1995   adenomatous polyps  . PONV (postoperative nausea and vomiting)   . Recurrent UTI (urinary tract infection)    "2-3 times in the last 1 1/79yr" (11/29/2017)  . Rheumatoid arthritis ([place])   . Tachycardia, unspecified   . Type II diabetes mellitus ([place])   . Unspecified adverse effect of  unspecified drug, medicinal and biological substance   . Unspecified essential hypertension   . Unspecified hypothyroidism     Past Surgical History:  Procedure Laterality Date  . ABDOMINAL HYSTERECTOMY     "partial"  . CATARACT EXTRACTION Bilateral   . FOREARM FRACTURE SURGERY Right 1940s?  . FRACTURE SURGERY    . ORIF FEMUR FRACTURE  08/30/2012   Procedure: OPEN REDUCTION INTERNAL FIXATION (ORIF) DISTAL FEMUR FRACTURE;  Surgeon: Mauri Pole, MD;  Location: [facility];  Service: Orthopedics;  Laterality: Right;  proximal femur  . THYROIDECTOMY, PARTIAL         HPI  from the history and physical done on the day of admission:    Chief Complaint: Weak and tired  HPI: [name] is a 84 y.o. female with history h/o dementia, rheumatoid arthritis on prednisone, GERD, colon polyps, GI bleed in 2014 secondary to diverticulosis, atrial fibrillation off anticoagulation due to history of GI bleed/falls, diabetes mellitus brought in after caregiver this morning noticed that patient appeared weak and was leaning to her side in the bed.  Currently her nighttime caregiver bedside and states patient was in  her usual state of health yesterday when she took care of her.  In the ED patient was noted to have 2 episodes of bloody stools.  The second episode was melena while the first 1 showed dark blood mixed with stool.  Patient denies any abdominal pain or chest pain.  She is also demented and unreliable historian.  Work-up in the ED revealed stable hemoglobin around 10.  Patient not on any NSAIDs or aspirin.  Takes prednisone for history of rheumatoid arthritis.      Hospital Course:     Brief Narrative: [name] an 84 year old female with history of dementia rheumatoid arthritis on prednisone GERD colonic polyp GI bleed in 2014 found to be due to diverticulosis, atrial fibrillation not on any anticoagulation due to GI bleed,diabetes mellitus, who was brought to emergency  room for evaluation because patient appeared weak and leaning to one side. In the emergency department she was found to have 2 bloody stools, hemoglobin dropped to 6.6 on 08/10/2018 from a baseline of somewhere between 10 and 11   Assessment & Plan:  Active Problems: GI bleed   1. GI bleed GI consult appreciated. GI advised for conservative management due to her advanced dementia and multiple comorbid condition.  No endoluminal evaluation undertaken, --- transfused on 08/10/2018, hemoglobin remained stable above 11 posttransfusion . continue oral Protonix, avoid NSAIDs  2. Advanced dementia-- significant cognitive deficits, continue Remeron  3. Anemia stable.--- Hgb above 11.,   Gastroenterologist has signed off. Continue ferrous sulfate iron replacement.  4. Rheumatoid arthritis --- okay to continue prednisone, patient and family understand the risk of GI bleed with prednisone use   . 5. Type 2 diabetes mellitus--- discharged home on prior admission dose of metformin and Tradjenta  6. Hypocalcemia --- continue calcium replacement,  calcium is 7.8 7. Hypothyroidism - continue levothyroxine  8. Hypertension: Stable,resume preadmission medications  9.Generalized weakness/debility -physical therapyconsult appreciated,pient has private paycaregivers ( 24/7 caregivers)--- discharge home with home health PT and RN and social worker patient may need to be placed in facility if she continues to decline  10) electrolyte abnormalities---on 08/13/18  magnesium was 1.2,  potassium wass 2.4, replaced IV and orally, repeat potassium noted,  DVT prophylaxis:SCD  Family Communication:Unable to contact family--tried calling Mr [name] ([phone])---left message Was able to speak with son Mr. Clovis Cao  Consultants:  Gastroenterologist, Dr. Lyndel Safe   Code Status : Full  Disposition Plan  : patient has 24/7 care at home,  Consults  :  Phy  therapy/Gi   Discharge Condition: stable  Follow UP  [place] Follow up.   Specialty:  Home Health Services Why:  home health services arranged Contact information: [place] [address] [phone]           Diet and Activity recommendation:  As advised  Discharge Instructions     Discharge Instructions    Call MD for:  difficulty breathing, headache or visual disturbances   Complete by:  As directed    Call MD for:  persistant dizziness or light-headedness   Complete by:  As directed    Call MD for:  persistant nausea and vomiting   Complete by:  As directed    Call MD for:  severe uncontrolled pain   Complete by:  As directed    Call MD for:  temperature >100.4   Complete by:  As directed    Diet - low sodium heart healthy  Complete by:  As directed    Discharge instructions   Complete by:  As directed    1)Patient has generalized weakness/Anemia/Rheumatoid and needs wound care 2)Apply Santyl to right 2nd toe wound Q day, then cover with moist 2X2 and foam dressing.  (Change foam dressing Q 3 days or PRN soiling.) 3)Avoid ibuprofen/Advil/Aleve/Motrin/Goody Powders/Naproxen/BC powders/Meloxicam/Diclofenac/Indomethacin and other Nonsteroidal anti-inflammatory medications as these will make you more likely to bleed and can cause stomach ulcers, can also cause Kidney problems.  4)Fall Precautions   Walk with assistance   Complete by:  As directed    High fall risk        Discharge Medications     Allergies as of 08/14/2018      Reactions   Metformin And Related Diarrhea      Medication List    STOP taking these medications   omeprazole 40 MG capsule Commonly known as:  PRILOSEC     TAKE these medications   acetaminophen 325 MG tablet Commonly known as:  TYLENOL Take 2 tablets (650 mg total) by mouth every 6 (six) hours as needed for mild pain, fever or headache (or Fever >/= 101).     benzonatate 200 MG capsule Commonly known as:  TESSALON Take 200 mg by mouth 3 (three) times daily as needed for cough.   calcium carbonate 500 MG chewable tablet Commonly known as:  TUMS - dosed in mg elemental calcium Chew 1 tablet (200 mg of elemental calcium total) by mouth 2 (two) times daily.   CENTRUM SILVER 50+WOMEN Tabs Take 1 tablet by mouth daily.   collagenase ointment Commonly known as:  SANTYL Apply topically daily. Apply Santyl to right 2nd toe wound Q day, then cover with moist 2X2 and foam dressing.  (Change foam dressing Q 3 days or PRN soiling.) Start taking on:  08/15/2018   feeding supplement (ENSURE ENLIVE) Liqd Take 237 mLs by mouth 2 (two) times daily between meals.   ferrous sulfate 220 (44 Fe) MG/5ML solution Take 440 mg by mouth daily. Take with large glass of orange juice 30 mins before breakfast.   folic acid 1 MG tablet Commonly known as:  FOLVITE Take 1,000 mcg by mouth daily.   leflunomide 20 MG tablet Commonly known as:  ARAVA Take 20 mg by mouth daily.   levothyroxine 100 MCG tablet Commonly known as:  SYNTHROID, LEVOTHROID Take 1 tablet (100 mcg total) by mouth daily. What changed:  how much to take   LIVALO 2 MG Tabs Generic drug:  Pitavastatin Calcium Take 2 mg by mouth every Monday, Wednesday, and Friday.   magnesium oxide 400 (241.3 Mg) MG tablet Commonly known as:  MAG-OX Take 0.5 tablets (200 mg total) by mouth 2 (two) times daily.   Melatonin 2.5 MG Caps Take 1 capsule (2.5 mg total) by mouth at bedtime as needed (for sleep).   metFORMIN 500 MG 24 hr tablet Commonly known as:  GLUCOPHAGE-XR Take 500 mg by mouth See admin instructions. Take 1000 mg by mouth in the morning and 500 mg by mouth in the evening   mirtazapine 7.5 MG tablet Commonly known as:  REMERON Take 7.5 mg by mouth at bedtime.   MYRBETRIQ 25 MG Tb24 tablet Generic drug:  mirabegron ER Take 25 mg by mouth daily.   ondansetron 4 MG tablet Commonly  known as:  ZOFRAN Take 1 tablet (4 mg total) by mouth every 6 (six) hours as needed for nausea.   pantoprazole 40 MG tablet Commonly known as:  PROTONIX Take 1 tablet (40 mg total) by mouth 2 (two) times daily.   predniSONE 1 MG tablet Commonly known as:  DELTASONE Take 4 mg by mouth daily with breakfast.   TRADJENTA 5 MG Tabs tablet Generic drug:  linagliptin Take 5 mg by mouth daily.   VOLTAREN 1 % Gel Generic drug:  diclofenac sodium Apply 4 g topically 4 (four) times daily. Hands and knees       Major procedures and Radiology Reports - PLEASE review detailed and final reports for all details, in brief -   Dg Chest 2 View  Result Date: 08/08/2018 CLINICAL DATA:  Weakness. EXAM: CHEST - 2 VIEW COMPARISON:  Chest x-ray 06/03/2018. FINDINGS: Mediastinum hilar structures normal. Mild bibasilar subsegmental atelectasis/scarring. No acute infiltrate. No pleural effusion or pneumothorax. Cardiomegaly with normal pulmonary vascularity. No acute bony abnormality. IMPRESSION: Cardiomegaly with normal pulmonary vascularity. Mild bibasilar subsegmental atelectasis/scarring. No acute infiltrate. Electronically Signed   By: Marcello Moores  Register   On: 08/08/2018 12:51   Ct Head Wo Contrast  Result Date: 08/08/2018 CLINICAL DATA:  Altered mental status, weaker today, leading to the RIGHT, history atrial fibrillation, type II diabetes mellitus, hypertension EXAM: CT HEAD WITHOUT CONTRAST TECHNIQUE: Contiguous axial images were obtained from the base of the skull through the vertex without intravenous contrast. COMPARISON:  06/03/2018 FINDINGS: Brain: Generalized atrophy. Normal ventricular morphology. No midline shift or mass effect. Small vessel chronic ischemic changes of deep cerebral white matter. No intracranial hemorrhage, mass lesion, evidence of acute infarction, or extra-axial fluid collection. Vascular: Atherosclerotic calcifications of internal carotid and vertebral arteries at skull base  Skull: Demineralized but intact Sinuses/Orbits: Clear Other: N/A IMPRESSION: Atrophy with small vessel chronic ischemic changes of deep cerebral white matter. No acute intracranial abnormalities. Electronically Signed   By: Lavonia Dana M.D.   On: 08/08/2018 14:19    Micro Results    Recent Results (from the past 240 hour(s))  MRSA PCR Screening     Status: Abnormal   Collection Time: 08/11/18  7:55 AM  Result Value Ref Range Status   MRSA by PCR POSITIVE (A) NEGATIVE Final    Comment:        The GeneXpert MRSA Assay (FDA approved for NASAL specimens only), is one component of a comprehensive MRSA colonization surveillance program. It is not intended to diagnose MRSA infection nor to guide or monitor treatment for MRSA infections. RESULT CALLED TO, READ BACK BY AND VERIFIED WITH: K. Duffy RN 11:35 08/11/18 ([email or web address]) Performed at [facility], [place] [address], [place], [place] [address]        Today   Subjective    [name] today has no new complaints, a lot more awake, eating and drinking well          Patient has been seen and examined prior to discharge   Objective   Blood pressure (!) 172/97, pulse 84, temperature 98.6 F (37 C), resp. rate 16, height 5\' 2"  (1.575 m), weight 43.5 kg, SpO2 (!) 83 %.   Intake/Output Summary (Last 24 hours) at 08/14/2018 1610 Last data filed at 08/14/2018 0701 Gross per 24 hour  Intake 120 ml  Output 450 ml  Net -330 ml    Exam Patient is examined daily including today on 08/14/18 , exams remain the same as of yesterday except that has changed   Gen:-  awake, talking , in no apparent distress  HEENT:- [place].AT, No sclera icterus Neck-Supple Neck,No JVD,.  Lungs-  CTAB , fair symmetrical  air movement CV- S1, S2 normal, regular  Abd-  +ve B.Sounds, Abd Soft, No tenderness,    Extremity/Skin:- No  edema, pedal pulses present  Psych-affect is flat with cognitive deficits  Neuro-generalized weakness, no new focal  deficits, no tremors   Data Review   CBC w Diff:  Lab Results  Component Value Date   WBC 8.5 08/13/2018   HGB 12.4 08/13/2018   HGB 9.4 (L) 12/17/2017   HCT 39.7 08/13/2018   PLT 225 08/13/2018   PLT 432 (H) 12/17/2017   LYMPHOPCT 10 08/12/2018   MONOPCT 8 08/12/2018   EOSPCT 2 08/12/2018   BASOPCT 0 08/12/2018    CMP:  Lab Results  Component Value Date   NA 141 08/14/2018   K 3.6 08/14/2018   CL 113 (H) 08/14/2018   CO2 21 (L) 08/14/2018   BUN 6 (L) 08/14/2018   CREATININE 0.64 08/14/2018   CREATININE 0.68 12/17/2017   PROT 6.1 (L) 08/08/2018   ALBUMIN 3.1 (L) 08/08/2018   BILITOT 0.7 08/08/2018   BILITOT 0.3 12/17/2017   ALKPHOS 61 08/08/2018   AST 24 08/08/2018   AST 12 12/17/2017   ALT 14 08/08/2018   ALT 8 12/17/2017  .   Total Discharge time is about 33 minutes  Roxan Hockey M.D on 08/14/2018 at 4:10 PM  Pager---[phone]  Go to [email or web address] - password [email or web address] for contact info  [facility] - Office  [phone]

## 2018-08-14 NOTE — Progress Notes (Signed)
Pt & Caregiver given discharge instructions, prescriptions, and care notes. Caregiver verbalized understanding AEB no further questions or concerns at this time. IV was discontinued, no redness, pain, or swelling noted at this time. Telemetry discontinued and Centralized Telemetry was notified. Pt being taken home via PTAR. [name] son aware of DC.

## 2018-08-14 NOTE — Discharge Instructions (Signed)
1)Patient has generalized weakness/Anemia/Rheumatoid and needs wound care 2)Apply Santyl to right 2nd toe wound Q day, then cover with moist 2X2 and foam dressing.  (Change foam dressing Q 3 days or PRN soiling.) 3)Avoid ibuprofen/Advil/Aleve/Motrin/Goody Powders/Naproxen/BC powders/Meloxicam/Diclofenac/Indomethacin and other Nonsteroidal anti-inflammatory medications as these will make you more likely to bleed and can cause stomach ulcers, can also cause Kidney problems.  4)Fall Precautions

## 2018-08-15 DIAGNOSIS — K922 Gastrointestinal hemorrhage, unspecified: Secondary | ICD-10-CM | POA: Diagnosis not present

## 2018-08-15 DIAGNOSIS — M0600 Rheumatoid arthritis without rheumatoid factor, unspecified site: Secondary | ICD-10-CM | POA: Diagnosis not present

## 2018-08-15 DIAGNOSIS — E11621 Type 2 diabetes mellitus with foot ulcer: Secondary | ICD-10-CM | POA: Diagnosis not present

## 2018-08-15 DIAGNOSIS — L97513 Non-pressure chronic ulcer of other part of right foot with necrosis of muscle: Secondary | ICD-10-CM | POA: Diagnosis not present

## 2018-08-15 DIAGNOSIS — I10 Essential (primary) hypertension: Secondary | ICD-10-CM | POA: Diagnosis not present

## 2018-08-15 DIAGNOSIS — I482 Chronic atrial fibrillation, unspecified: Secondary | ICD-10-CM | POA: Diagnosis not present

## 2018-08-18 DIAGNOSIS — I10 Essential (primary) hypertension: Secondary | ICD-10-CM | POA: Diagnosis not present

## 2018-08-18 DIAGNOSIS — L97512 Non-pressure chronic ulcer of other part of right foot with fat layer exposed: Secondary | ICD-10-CM | POA: Diagnosis not present

## 2018-08-18 DIAGNOSIS — M069 Rheumatoid arthritis, unspecified: Secondary | ICD-10-CM | POA: Diagnosis not present

## 2018-08-18 DIAGNOSIS — E11621 Type 2 diabetes mellitus with foot ulcer: Secondary | ICD-10-CM | POA: Diagnosis not present

## 2018-08-18 DIAGNOSIS — E1142 Type 2 diabetes mellitus with diabetic polyneuropathy: Secondary | ICD-10-CM | POA: Diagnosis not present

## 2018-08-19 DIAGNOSIS — K922 Gastrointestinal hemorrhage, unspecified: Secondary | ICD-10-CM | POA: Diagnosis not present

## 2018-08-19 DIAGNOSIS — L97513 Non-pressure chronic ulcer of other part of right foot with necrosis of muscle: Secondary | ICD-10-CM | POA: Diagnosis not present

## 2018-08-19 DIAGNOSIS — I482 Chronic atrial fibrillation, unspecified: Secondary | ICD-10-CM | POA: Diagnosis not present

## 2018-08-19 DIAGNOSIS — I10 Essential (primary) hypertension: Secondary | ICD-10-CM | POA: Diagnosis not present

## 2018-08-19 DIAGNOSIS — M0600 Rheumatoid arthritis without rheumatoid factor, unspecified site: Secondary | ICD-10-CM | POA: Diagnosis not present

## 2018-08-19 DIAGNOSIS — E11621 Type 2 diabetes mellitus with foot ulcer: Secondary | ICD-10-CM | POA: Diagnosis not present

## 2018-08-20 DIAGNOSIS — I10 Essential (primary) hypertension: Secondary | ICD-10-CM | POA: Diagnosis not present

## 2018-08-20 DIAGNOSIS — K922 Gastrointestinal hemorrhage, unspecified: Secondary | ICD-10-CM | POA: Diagnosis not present

## 2018-08-20 DIAGNOSIS — I482 Chronic atrial fibrillation, unspecified: Secondary | ICD-10-CM | POA: Diagnosis not present

## 2018-08-20 DIAGNOSIS — E11621 Type 2 diabetes mellitus with foot ulcer: Secondary | ICD-10-CM | POA: Diagnosis not present

## 2018-08-20 DIAGNOSIS — L97513 Non-pressure chronic ulcer of other part of right foot with necrosis of muscle: Secondary | ICD-10-CM | POA: Diagnosis not present

## 2018-08-20 DIAGNOSIS — M0600 Rheumatoid arthritis without rheumatoid factor, unspecified site: Secondary | ICD-10-CM | POA: Diagnosis not present

## 2018-08-22 DIAGNOSIS — K922 Gastrointestinal hemorrhage, unspecified: Secondary | ICD-10-CM | POA: Diagnosis not present

## 2018-08-22 DIAGNOSIS — I482 Chronic atrial fibrillation, unspecified: Secondary | ICD-10-CM | POA: Diagnosis not present

## 2018-08-22 DIAGNOSIS — I10 Essential (primary) hypertension: Secondary | ICD-10-CM | POA: Diagnosis not present

## 2018-08-22 DIAGNOSIS — L97513 Non-pressure chronic ulcer of other part of right foot with necrosis of muscle: Secondary | ICD-10-CM | POA: Diagnosis not present

## 2018-08-22 DIAGNOSIS — E11621 Type 2 diabetes mellitus with foot ulcer: Secondary | ICD-10-CM | POA: Diagnosis not present

## 2018-08-22 DIAGNOSIS — M0600 Rheumatoid arthritis without rheumatoid factor, unspecified site: Secondary | ICD-10-CM | POA: Diagnosis not present

## 2018-08-27 DIAGNOSIS — L97513 Non-pressure chronic ulcer of other part of right foot with necrosis of muscle: Secondary | ICD-10-CM | POA: Diagnosis not present

## 2018-08-27 DIAGNOSIS — E11621 Type 2 diabetes mellitus with foot ulcer: Secondary | ICD-10-CM | POA: Diagnosis not present

## 2018-08-27 DIAGNOSIS — K922 Gastrointestinal hemorrhage, unspecified: Secondary | ICD-10-CM | POA: Diagnosis not present

## 2018-08-27 DIAGNOSIS — M0600 Rheumatoid arthritis without rheumatoid factor, unspecified site: Secondary | ICD-10-CM | POA: Diagnosis not present

## 2018-08-27 DIAGNOSIS — I10 Essential (primary) hypertension: Secondary | ICD-10-CM | POA: Diagnosis not present

## 2018-08-27 DIAGNOSIS — I482 Chronic atrial fibrillation, unspecified: Secondary | ICD-10-CM | POA: Diagnosis not present

## 2018-08-28 DIAGNOSIS — K922 Gastrointestinal hemorrhage, unspecified: Secondary | ICD-10-CM | POA: Diagnosis not present

## 2018-08-28 DIAGNOSIS — E11621 Type 2 diabetes mellitus with foot ulcer: Secondary | ICD-10-CM | POA: Diagnosis not present

## 2018-08-28 DIAGNOSIS — M0600 Rheumatoid arthritis without rheumatoid factor, unspecified site: Secondary | ICD-10-CM | POA: Diagnosis not present

## 2018-08-28 DIAGNOSIS — L97513 Non-pressure chronic ulcer of other part of right foot with necrosis of muscle: Secondary | ICD-10-CM | POA: Diagnosis not present

## 2018-08-28 DIAGNOSIS — I10 Essential (primary) hypertension: Secondary | ICD-10-CM | POA: Diagnosis not present

## 2018-08-28 DIAGNOSIS — I482 Chronic atrial fibrillation, unspecified: Secondary | ICD-10-CM | POA: Diagnosis not present

## 2018-08-29 ENCOUNTER — Encounter (HOSPITAL_BASED_OUTPATIENT_CLINIC_OR_DEPARTMENT_OTHER): Payer: Medicare Other | Attending: Internal Medicine

## 2018-08-29 DIAGNOSIS — Z7984 Long term (current) use of oral hypoglycemic drugs: Secondary | ICD-10-CM | POA: Insufficient documentation

## 2018-08-29 DIAGNOSIS — X58XXXA Exposure to other specified factors, initial encounter: Secondary | ICD-10-CM | POA: Diagnosis not present

## 2018-08-29 DIAGNOSIS — E1142 Type 2 diabetes mellitus with diabetic polyneuropathy: Secondary | ICD-10-CM | POA: Insufficient documentation

## 2018-08-29 DIAGNOSIS — S81812A Laceration without foreign body, left lower leg, initial encounter: Secondary | ICD-10-CM | POA: Insufficient documentation

## 2018-08-29 DIAGNOSIS — I10 Essential (primary) hypertension: Secondary | ICD-10-CM | POA: Insufficient documentation

## 2018-08-29 DIAGNOSIS — L97516 Non-pressure chronic ulcer of other part of right foot with bone involvement without evidence of necrosis: Secondary | ICD-10-CM | POA: Insufficient documentation

## 2018-08-29 DIAGNOSIS — L97812 Non-pressure chronic ulcer of other part of right lower leg with fat layer exposed: Secondary | ICD-10-CM | POA: Diagnosis not present

## 2018-08-29 DIAGNOSIS — S81811A Laceration without foreign body, right lower leg, initial encounter: Secondary | ICD-10-CM | POA: Diagnosis not present

## 2018-08-29 DIAGNOSIS — E11621 Type 2 diabetes mellitus with foot ulcer: Secondary | ICD-10-CM | POA: Insufficient documentation

## 2018-09-05 DIAGNOSIS — Z7189 Other specified counseling: Secondary | ICD-10-CM | POA: Diagnosis not present

## 2018-09-12 ENCOUNTER — Emergency Department (HOSPITAL_COMMUNITY): Payer: Medicare Other

## 2018-09-12 ENCOUNTER — Emergency Department (HOSPITAL_COMMUNITY)
Admission: EM | Admit: 2018-09-12 | Discharge: 2018-09-12 | Disposition: A | Discharge status: Home / Self Care | Payer: Medicare Other | Attending: Emergency Medicine | Admitting: Emergency Medicine

## 2018-09-12 ENCOUNTER — Encounter (HOSPITAL_COMMUNITY): Payer: Self-pay | Admitting: Emergency Medicine

## 2018-09-12 DIAGNOSIS — E119 Type 2 diabetes mellitus without complications: Secondary | ICD-10-CM | POA: Insufficient documentation

## 2018-09-12 DIAGNOSIS — R404 Transient alteration of awareness: Secondary | ICD-10-CM | POA: Diagnosis not present

## 2018-09-12 DIAGNOSIS — E039 Hypothyroidism, unspecified: Secondary | ICD-10-CM | POA: Diagnosis not present

## 2018-09-12 DIAGNOSIS — R410 Disorientation, unspecified: Secondary | ICD-10-CM | POA: Diagnosis not present

## 2018-09-12 DIAGNOSIS — Z7984 Long term (current) use of oral hypoglycemic drugs: Secondary | ICD-10-CM | POA: Diagnosis not present

## 2018-09-12 DIAGNOSIS — F0390 Unspecified dementia without behavioral disturbance: Secondary | ICD-10-CM | POA: Diagnosis not present

## 2018-09-12 DIAGNOSIS — I700 Atherosclerosis of aorta: Secondary | ICD-10-CM | POA: Diagnosis not present

## 2018-09-12 DIAGNOSIS — I10 Essential (primary) hypertension: Secondary | ICD-10-CM | POA: Insufficient documentation

## 2018-09-12 DIAGNOSIS — Z79899 Other long term (current) drug therapy: Secondary | ICD-10-CM | POA: Diagnosis not present

## 2018-09-12 DIAGNOSIS — I4891 Unspecified atrial fibrillation: Secondary | ICD-10-CM | POA: Diagnosis not present

## 2018-09-12 DIAGNOSIS — R4182 Altered mental status, unspecified: Secondary | ICD-10-CM | POA: Diagnosis present

## 2018-09-12 DIAGNOSIS — I499 Cardiac arrhythmia, unspecified: Secondary | ICD-10-CM | POA: Diagnosis not present

## 2018-09-12 DIAGNOSIS — Z7401 Bed confinement status: Secondary | ICD-10-CM | POA: Diagnosis not present

## 2018-09-12 DIAGNOSIS — M2550 Pain in unspecified joint: Secondary | ICD-10-CM | POA: Diagnosis not present

## 2018-09-12 LAB — URINALYSIS, ROUTINE W REFLEX MICROSCOPIC
BILIRUBIN URINE: NEGATIVE
GLUCOSE, UA: NEGATIVE mg/dL
Hgb urine dipstick: NEGATIVE
KETONES UR: NEGATIVE mg/dL
Leukocytes, UA: NEGATIVE
Nitrite: NEGATIVE
PH: 5 (ref 5.0–8.0)
Protein, ur: NEGATIVE mg/dL
Specific Gravity, Urine: 1.013 (ref 1.005–1.030)

## 2018-09-12 LAB — COMPREHENSIVE METABOLIC PANEL
ALBUMIN: 3.1 g/dL — AB (ref 3.5–5.0)
ALK PHOS: 49 U/L (ref 38–126)
ALT: 11 U/L (ref 0–44)
AST: 15 U/L (ref 15–41)
Anion gap: 10 (ref 5–15)
BUN: 15 mg/dL (ref 8–23)
CALCIUM: 8.2 mg/dL — AB (ref 8.9–10.3)
CO2: 25 mmol/L (ref 22–32)
CREATININE: 0.71 mg/dL (ref 0.44–1.00)
Chloride: 107 mmol/L (ref 98–111)
GFR calc Af Amer: >60 mL/min (ref >60)
GFR calc non Af Amer: >60 mL/min (ref >60)
GLUCOSE: 114 mg/dL — AB (ref 70–99)
Potassium: 3.7 mmol/L (ref 3.5–5.1)
SODIUM: 142 mmol/L (ref 135–145)
Total Bilirubin: 0.7 mg/dL (ref 0.3–1.2)
Total Protein: 6.2 g/dL — ABNORMAL LOW (ref 6.5–8.1)

## 2018-09-12 LAB — CBC WITH DIFFERENTIAL/PLATELET
ABS IMMATURE GRANULOCYTES: 0.07 10*3/uL (ref 0.00–0.07)
BASOS ABS: 0.1 10*3/uL (ref 0.0–0.1)
Basophils Relative: 1 %
Eosinophils Absolute: 0.2 10*3/uL (ref 0.0–0.5)
Eosinophils Relative: 3 %
HCT: 41.1 % (ref 36.0–46.0)
Hemoglobin: 12.6 g/dL (ref 12.0–15.0)
Immature Granulocytes: 1 %
Lymphocytes Relative: 25 %
Lymphs Abs: 1.8 10*3/uL (ref 0.7–4.0)
MCH: 27.8 pg (ref 26.0–34.0)
MCHC: 30.7 g/dL (ref 30.0–36.0)
MCV: 90.7 fL (ref 80.0–100.0)
Monocytes Absolute: 0.6 10*3/uL (ref 0.1–1.0)
Monocytes Relative: 8 %
NEUTROS ABS: 4.4 10*3/uL (ref 1.7–7.7)
NRBC: 0 % (ref 0.0–0.2)
Neutrophils Relative %: 62 %
Platelets: 275 10*3/uL (ref 150–400)
RBC: 4.53 MIL/uL (ref 3.87–5.11)
RDW: 17.2 % — ABNORMAL HIGH (ref 11.5–15.5)
WBC: 7.2 10*3/uL (ref 4.0–10.5)

## 2018-09-12 LAB — POC OCCULT BLOOD, ED: Fecal Occult Bld: NEGATIVE

## 2018-09-12 LAB — TROPONIN I
Troponin I: 0.03 ng/mL (ref <0.03)
Troponin I: 0.03 ng/mL (ref <0.03)

## 2018-09-12 MED ORDER — SODIUM CHLORIDE 0.9 % IV BOLUS
1000.0000 mL | Freq: Once | INTRAVENOUS | Status: AC
  Administered 2018-09-12: 1000 mL via INTRAVENOUS

## 2018-09-12 NOTE — ED Provider Notes (Signed)
[facility] Provider Note   CSN: [id number] Arrival date & time: 09/12/18  0920     History   Chief Complaint Chief Complaint  Patient presents with  . Altered Mental Status    HPI [name] is a 84 y.o. female hx of anemia, afib not on anticoagulation, here presenting with confusion, altered mental status.  Patient was brought in by EMS.  Patient actually has home health aide at home and the aide left yesterday around 5 PM.  Another aide arrived around 9 AM this morning and she appears to be confused.  She apparently was supposed to eat breakfast but was attempting to get out of bed and thought that she was in [place].  Patient had similar admissions for confusion and had multiple MRIs that did not show a stroke.  The history is provided by a caregiver.    Past Medical History:  Diagnosis Date  . Acute bronchitis   . Anemia, unspecified   . Atrial fibrillation ([place])    not on AC due to falls  . Depressive disorder, not elsewhere classified   . Diverticulosis of colon (without mention of hemorrhage)   . Esophageal reflux   . Family history of malignant neoplasm of gastrointestinal tract   . Functional diarrhea   . Hiatal hernia   . History of blood transfusion    "once; related to diverticulitis" (11/29/2017)  . Other and unspecified hyperlipidemia   . Other chest pain   . Other specified cardiac dysrhythmias(427.89)   . Personal history of colonic polyps 07/17/1995   adenomatous polyps  . PONV (postoperative nausea and vomiting)   . Recurrent UTI (urinary tract infection)    "2-3 times in the last 1 1/3yr" (11/29/2017)  . Rheumatoid arthritis ([place])   . Tachycardia, unspecified   . Type II diabetes mellitus ([place])   . Unspecified adverse effect of unspecified drug, medicinal and biological substance   . Unspecified essential hypertension   . Unspecified hypothyroidism     Patient Active Problem List   Diagnosis Date  Noted  . GI bleed 08/08/2018  . Altered mental status, unspecified 06/04/2018  . Acute encephalopathy 06/03/2018  . Atrial fibrillation, chronic 06/03/2018  . Pneumonia 04/26/2018  . TIA (transient ischemic attack) 04/25/2018  . Aphasia   . Pressure injury of skin 02/26/2018  . Hyperkalemia 02/25/2018  . Altered mental status 02/25/2018  . Acute lower UTI 02/25/2018  . Iron deficiency anemia due to chronic blood loss 12/17/2017  . Protein-calorie malnutrition, severe 11/30/2017  . Syncope 11/29/2017  . Diabetic ulcer of left foot associated with type 2 diabetes mellitus ([place]) 08/11/2015  . Closed right hip fracture ([place]) 08/28/2012  . History of gastroesophageal reflux (GERD) 06/26/2011  . Lower GI bleeding 06/26/2011  . DEPRESSION 03/31/2009  . DIARRHEA, FUNCTIONAL 03/31/2009  . DIABETIC HYPOGLYCEMIA, TYPE II 11/03/2008  . ACUTE BRONCHITIS 08/23/2008  . AV NODAL REENTRY TACHYCARDIA 10/29/2007  . TACHYCARDIA 10/29/2007  . CHEST PAIN, ATYPICAL 10/25/2007  . ADVEF, DRUG/MEDICINAL/BIOLOGICAL SUBST NOS 07/16/2007  . Nonspecific (abnormal) findings on radiological and other examination of body structure 04/03/2007  . CHEST XRAY, ABNORMAL 04/03/2007  . Hypothyroidism (acquired) 03/06/2007  . Type 2 diabetes mellitus with other specified complication ([place]) 35/45/6256  . HYPERLIPIDEMIA 03/06/2007  . Essential hypertension 03/06/2007  . DIVERTICULOSIS, COLON 03/06/2007  . Rheumatoid arthritis involving both hands with positive rheumatoid factor ([place]) 03/06/2007    Past Surgical History:  Procedure Laterality Date  . ABDOMINAL HYSTERECTOMY     "  partial"  . CATARACT EXTRACTION Bilateral   . FOREARM FRACTURE SURGERY Right 1940s?  . FRACTURE SURGERY    . ORIF FEMUR FRACTURE  08/30/2012   Procedure: OPEN REDUCTION INTERNAL FIXATION (ORIF) DISTAL FEMUR FRACTURE;  Surgeon: Mauri Pole, MD;  Location: [facility];  Service: Orthopedics;  Laterality: Right;  proximal femur  . THYROIDECTOMY,  PARTIAL       OB History   No obstetric history on file.      Home Medications    Prior to Admission medications   Medication Sig Start Date End Date Taking? Authorizing Provider  acetaminophen (TYLENOL) 325 MG tablet Take 2 tablets (650 mg total) by mouth every 6 (six) hours as needed for mild pain, fever or headache (or Fever >/= 101). 08/14/18  Yes Emokpae, Courage, MD  benzonatate (TESSALON) 200 MG capsule Take 200 mg by mouth 3 (three) times daily as needed for cough. 08/05/18  Yes [provider]  calcium carbonate (TUMS - DOSED IN MG ELEMENTAL CALCIUM) 500 MG chewable tablet Chew 1 tablet (200 mg of elemental calcium total) by mouth 2 (two) times daily. 08/14/18  Yes Roxan Hockey, MD  collagenase (SANTYL) ointment Apply topically daily. Apply Santyl to right 2nd toe wound Q day, then cover with moist 2X2 and foam dressing.  (Change foam dressing Q 3 days or PRN soiling.) 08/15/18  Yes Emokpae, Courage, MD  folic acid (FOLVITE) 1 MG tablet Take 1,000 mcg by mouth daily. 07/16/18  Yes [provider]  leflunomide (ARAVA) 20 MG tablet Take 20 mg by mouth daily. 08/14/17  Yes [provider]  levothyroxine (SYNTHROID) 100 MCG tablet Take 1 tablet (100 mcg total) by mouth daily. Patient taking differently: Take 150 mcg by mouth daily.  12/07/17 12/07/18 Yes Regalado, Belkys A, MD  metFORMIN (GLUCOPHAGE-XR) 500 MG 24 hr tablet Take 500 mg by mouth See admin instructions. Take 1000 mg by mouth in the morning and 500 mg by mouth in the evening 03/07/15  Yes [provider]  mirtazapine (REMERON) 7.5 MG tablet Take 7.5 mg by mouth at bedtime. 08/05/18  Yes [provider]  Multiple Vitamins-Minerals (CENTRUM SILVER 50+WOMEN) TABS Take 1 tablet by mouth daily.   Yes [provider]  MYRBETRIQ 25 MG TB24 tablet Take 25 mg by mouth daily. 06/25/18  Yes [provider]  ondansetron (ZOFRAN) 4 MG tablet Take 1 tablet (4 mg total) by  mouth every 6 (six) hours as needed for nausea. 08/14/18  Yes Emokpae, Courage, MD  pantoprazole (PROTONIX) 40 MG tablet Take 1 tablet (40 mg total) by mouth 2 (two) times daily. 08/14/18  Yes Roxan Hockey, MD  Pitavastatin Calcium (LIVALO) 2 MG TABS Take 2 mg by mouth every Monday, Wednesday, and Friday.   Yes [provider]  predniSONE (DELTASONE) 1 MG tablet Take 4 mg by mouth daily with breakfast.    Yes [provider]  TRADJENTA 5 MG TABS tablet Take 5 mg by mouth daily. 01/29/18  Yes [provider]  VOLTAREN 1 % GEL Apply 4 g topically 4 (four) times daily. Apply to Hands and knees 11/30/13  Yes [provider]  feeding supplement, ENSURE ENLIVE, (ENSURE ENLIVE) LIQD Take 237 mLs by mouth 2 (two) times daily between meals. Patient not taking: Reported on 06/03/2018 02/26/18   Lavina Hamman, MD  magnesium oxide (MAG-OX) 400 (241.3 Mg) MG tablet Take 0.5 tablets (200 mg total) by mouth 2 (two) times daily. Patient not taking: Reported on 08/08/2018 12/07/17  Regalado, Belkys A, MD  Melatonin 2.5 MG CAPS Take 1 capsule (2.5 mg total) by mouth at bedtime as needed (for sleep). Patient not taking: Reported on 08/08/2018 12/07/17   Elmarie Shiley, MD    Family History Family History  Problem Relation Age of Onset  . Colon cancer Maternal Grandmother     Social History Social History   Tobacco Use  . Smoking status: Never Smoker  . Smokeless tobacco: Never Used  Substance Use Topics  . Alcohol use: Not Currently    Alcohol/week: 1.0 standard drinks    Types: 1 Glasses of wine per week  . Drug use: No     Allergies   Metformin and related   Review of Systems Review of Systems  Unable to perform ROS: Dementia  All other systems reviewed and are negative.    Physical Exam Updated Vital Signs BP (!) 157/101 (BP Location: Right Arm)   Pulse (!) 41   Temp 97.8 F (36.6 C) (Rectal)   Resp (!) 24   SpO2 98%   Physical Exam Vitals  signs and nursing note reviewed.  Constitutional:      Comments: Demented, NAD   HENT:     Head: Normocephalic.     Nose: Nose normal.     Mouth/Throat:     Mouth: Mucous membranes are dry.  Eyes:     Pupils: Pupils are equal, round, and reactive to light.  Neck:     Musculoskeletal: Normal range of motion and neck supple.  Cardiovascular:     Rate and Rhythm: Normal rate.  Pulmonary:     Effort: Pulmonary effort is normal.     Breath sounds: Normal breath sounds.  Abdominal:     General: Abdomen is flat.     Palpations: Abdomen is soft.  Musculoskeletal: Normal range of motion.  Skin:    General: Skin is warm.     Capillary Refill: Capillary refill takes less than 2 seconds.  Neurological:     Mental Status: She is alert.     Comments: Confused, nonfocal neuro exam, moving all extremities   Psychiatric:     Comments: unable      ED Treatments / Results  Labs (all labs ordered are listed, but only abnormal results are displayed) Labs Reviewed  CBC WITH DIFFERENTIAL/PLATELET - Abnormal; Notable for the following components:      Result Value   RDW 17.2 (*)    All other components within normal limits  COMPREHENSIVE METABOLIC PANEL - Abnormal; Notable for the following components:   Glucose, Bld 114 (*)    Calcium 8.2 (*)    Total Protein 6.2 (*)    Albumin 3.1 (*)    All other components within normal limits  TROPONIN I - Abnormal; Notable for the following components:   Troponin I 0.03 (*)    All other components within normal limits  TROPONIN I - Abnormal; Notable for the following components:   Troponin I 0.03 (*)    All other components within normal limits  URINE CULTURE  URINALYSIS, ROUTINE W REFLEX MICROSCOPIC  POC OCCULT BLOOD, ED    EKG EKG Interpretation  Date/Time:  Friday September 12 2018 09:29:46 EST Ventricular Rate:  97 PR Interval:    QRS Duration: 83 QT Interval:  360 QTC Calculation: 458 R Axis:   51 Text Interpretation:  Sinus  rhythm Paired ventricular premature complexes Repol abnrm suggests ischemia, diffuse leads No significant change since last tracing Confirmed by Wandra Arthurs (  [id number]) on 09/12/2018 9:36:56 AM   Radiology Ct Head Wo Contrast  Result Date: 09/12/2018 CLINICAL DATA:  Increased confusion. History of dementia. EXAM: CT HEAD WITHOUT CONTRAST TECHNIQUE: Contiguous axial images were obtained from the base of the skull through the vertex without intravenous contrast. COMPARISON:  [facility] 152019 FINDINGS: Brain: There is no evidence of acute infarct, intracranial hemorrhage, mass, midline shift, or extra-axial fluid collection. Patchy to confluent cerebral white matter hypodensities are unchanged and nonspecific but compatible with moderate chronic small vessel ischemic disease. Mild cerebral atrophy for age is unchanged. Vascular: Calcified atherosclerosis at the skull base. No hyperdense vessel. Skull: No fracture or focal osseous lesion. Sinuses/Orbits: Visualized paranasal sinuses and mastoid air cells are clear. Bilateral cataract extraction is noted. Other: None. IMPRESSION: 1. No evidence of acute intracranial abnormality. 2. Moderate chronic small vessel ischemic disease. Electronically Signed   By: Logan Bores M.D.   On: 09/12/2018 11:32   Dg Chest Port 1 View  Result Date: 09/12/2018 CLINICAL DATA:  Altered mental status EXAM: PORTABLE CHEST 1 VIEW COMPARISON:  August 08, 2018 FINDINGS: There is no evident edema or consolidation. Heart is upper normal in size with pulmonary vascularity normal. There is aortic atherosclerosis. No adenopathy. Bones are osteoporotic. IMPRESSION: Aortic atherosclerosis. No edema or consolidation. Heart upper normal in size. Bones osteoporotic. Aortic Atherosclerosis (ICD10-I70.0). Electronically Signed   By: Lowella Grip III M.D.   On: 09/12/2018 09:46    Procedures Procedures (including critical care time)  Medications Ordered in ED Medications  sodium chloride  0.9 % bolus 1,000 mL (0 mLs Intravenous Stopped 09/12/18 1231)     Initial Impression / Assessment and Plan / ED Course  I have reviewed the triage vital signs and the nursing notes.  Pertinent labs & imaging results that were available during my care of the patient were reviewed by me and considered in my medical decision making (see chart for details).    NAKITA SANTERRE is a 84 y.o. female here with confusion. Patient demented at baseline. Patient's aide arrived and she called EMS. States that patient slightly more confused than usual but has history of dementia. She has no reported falls. Will get labs, CT head, CXR, UA to look for reversible causes. I think likely related to dementia   1:07 PM Initial troponin was 0.03. Repeat remains the same. No chest pain. UA nl. CT head unremarkable. I think likely dementia. Stable for discharge back home.   Final Clinical Impressions(s) / ED Diagnoses   Final diagnoses:  None    ED Discharge Orders    None       Drenda Freeze, MD 09/12/18 1308

## 2018-09-12 NOTE — Discharge Instructions (Signed)
Continue your current meds.   You likely have underlying dementia. You have no pneumonia or urinary tract infection currently   See your doctor  Return to ER if you have worse confusion, vomiting, lethargy.

## 2018-09-12 NOTE — ED Triage Notes (Signed)
Pt arrives by [facility] for confused- pt lives at home alone with baseline of dementia with caregivers, last care giver put pt in bed at 5pm and next caregiver arrived around 0900 and pt was attempting to get out of bed and told her she needed to get to the "baby" and thought she was in [place]. On arrival to eD pt is alert and oriented to person and place.

## 2018-09-12 NOTE — Care Management Note (Signed)
Case Management Note  [name] with [facility]) advised that they went out to see the pt today to start services and was unable to find the pt.  They advised they are following her for needs and are going to try to start care with her tomorrow if possible.  No further CM needs noted at this time.  Dalynn Jhaveri, Benjaman Lobe, RN 09/12/2018, 4:12 PM

## 2018-09-12 NOTE — ED Notes (Signed)
Called [facility] for pt transport  

## 2018-09-14 LAB — URINE CULTURE: Culture: >=100000 — AB

## 2018-09-15 ENCOUNTER — Telehealth: Payer: Self-pay | Admitting: *Deleted

## 2018-09-15 NOTE — Telephone Encounter (Signed)
Post ED Visit - Positive Culture Follow-up: Unsuccessful Patient Follow-up  Culture assessed and recommendations reviewed by:  []  Elenor Quinones, Pharm.D. []  Heide Guile, Pharm.D., BCPS AQ-ID []  Parks Neptune, Pharm.D., BCPS []  Alycia Rossetti, Pharm.D., BCPS []  [place], Pharm.D., BCPS, AAHIVP []  Legrand Como, Pharm.D., BCPS, AAHIVP []  Wynell Balloon, PharmD []  Vincenza Hews, PharmD, BCPS Shona Needles, PharmD  Positive urine culture  [x]  Patient discharged without antimicrobial prescription and treatment is now indicated []  Organism is resistant to prescribed ED discharge antimicrobial []  Patient with positive blood cultures Plan Amoxicillin 500mg  PO TID x 5 days, Alferd Apa, PA-C  Unable to contact patient after 3 attempts, letter will be sent to address on file  Ardeen Fillers 09/15/2018, 9:20 AM

## 2018-09-15 NOTE — Progress Notes (Signed)
ED Antimicrobial Stewardship Positive Culture Follow Up   [name] is an 84 y.o. female who presented to [facility] on 09/12/2018 with a chief complaint of  Chief Complaint  Patient presents with  . Altered Mental Status    Recent Results (from the past 720 hour(s))  Urine culture     Status: Abnormal   Collection Time: 09/12/18 12:22 PM  Result Value Ref Range Status   Specimen Description URINE, CLEAN CATCH  Final   Special Requests   Final    NONE Performed at [facility], [place] [address] [place], [place] [address]    Culture >=100,000 COLONIES/mL ENTEROCOCCUS FAECALIS (A)  Final   Report Status 09/14/2018 FINAL  Final   Organism ID, Bacteria ENTEROCOCCUS FAECALIS (A)  Final      Susceptibility   Enterococcus faecalis - MIC*    AMPICILLIN <=2 SENSITIVE Sensitive     LEVOFLOXACIN 2 SENSITIVE Sensitive     NITROFURANTOIN <=16 SENSITIVE Sensitive     VANCOMYCIN 1 SENSITIVE Sensitive     * >=100,000 COLONIES/mL ENTEROCOCCUS FAECALIS    []  Treated with N/A, organism resistant to prescribed antimicrobial [x]  Patient discharged originally without antimicrobial agent and treatment is now indicated  New antibiotic prescription: amoxicillin 500mg  PO TID x 5 days  ED Provider: Alferd Apa, PA   [name], [name] 09/15/2018, 8:58 AM Clinical Pharmacist Monday - Friday phone -  [phone] Saturday - Sunday phone - [phone]

## 2018-09-16 DIAGNOSIS — I482 Chronic atrial fibrillation, unspecified: Secondary | ICD-10-CM | POA: Diagnosis not present

## 2018-09-16 DIAGNOSIS — M48061 Spinal stenosis, lumbar region without neurogenic claudication: Secondary | ICD-10-CM | POA: Diagnosis not present

## 2018-09-16 DIAGNOSIS — E11621 Type 2 diabetes mellitus with foot ulcer: Secondary | ICD-10-CM | POA: Diagnosis not present

## 2018-09-16 DIAGNOSIS — L97514 Non-pressure chronic ulcer of other part of right foot with necrosis of bone: Secondary | ICD-10-CM | POA: Diagnosis not present

## 2018-09-16 DIAGNOSIS — M5136 Other intervertebral disc degeneration, lumbar region: Secondary | ICD-10-CM | POA: Diagnosis not present

## 2018-09-16 DIAGNOSIS — M05842 Other rheumatoid arthritis with rheumatoid factor of left hand: Secondary | ICD-10-CM | POA: Diagnosis not present

## 2018-09-16 DIAGNOSIS — E1140 Type 2 diabetes mellitus with diabetic neuropathy, unspecified: Secondary | ICD-10-CM | POA: Diagnosis not present

## 2018-09-16 DIAGNOSIS — M05841 Other rheumatoid arthritis with rheumatoid factor of right hand: Secondary | ICD-10-CM | POA: Diagnosis not present

## 2018-09-16 DIAGNOSIS — F0390 Unspecified dementia without behavioral disturbance: Secondary | ICD-10-CM | POA: Diagnosis not present

## 2018-09-16 DIAGNOSIS — E43 Unspecified severe protein-calorie malnutrition: Secondary | ICD-10-CM | POA: Diagnosis not present

## 2018-09-19 DIAGNOSIS — F0390 Unspecified dementia without behavioral disturbance: Secondary | ICD-10-CM | POA: Diagnosis not present

## 2018-09-19 DIAGNOSIS — Z7984 Long term (current) use of oral hypoglycemic drugs: Secondary | ICD-10-CM | POA: Diagnosis not present

## 2018-09-19 DIAGNOSIS — E11621 Type 2 diabetes mellitus with foot ulcer: Secondary | ICD-10-CM | POA: Diagnosis not present

## 2018-09-19 DIAGNOSIS — S81812A Laceration without foreign body, left lower leg, initial encounter: Secondary | ICD-10-CM | POA: Diagnosis not present

## 2018-09-19 DIAGNOSIS — L97514 Non-pressure chronic ulcer of other part of right foot with necrosis of bone: Secondary | ICD-10-CM | POA: Diagnosis not present

## 2018-09-19 DIAGNOSIS — M05841 Other rheumatoid arthritis with rheumatoid factor of right hand: Secondary | ICD-10-CM | POA: Diagnosis not present

## 2018-09-19 DIAGNOSIS — L97516 Non-pressure chronic ulcer of other part of right foot with bone involvement without evidence of necrosis: Secondary | ICD-10-CM | POA: Diagnosis not present

## 2018-09-19 DIAGNOSIS — I10 Essential (primary) hypertension: Secondary | ICD-10-CM | POA: Diagnosis not present

## 2018-09-19 DIAGNOSIS — M05842 Other rheumatoid arthritis with rheumatoid factor of left hand: Secondary | ICD-10-CM | POA: Diagnosis not present

## 2018-09-19 DIAGNOSIS — E43 Unspecified severe protein-calorie malnutrition: Secondary | ICD-10-CM | POA: Diagnosis not present

## 2018-09-19 DIAGNOSIS — S81811A Laceration without foreign body, right lower leg, initial encounter: Secondary | ICD-10-CM | POA: Diagnosis not present

## 2018-09-22 DIAGNOSIS — M05842 Other rheumatoid arthritis with rheumatoid factor of left hand: Secondary | ICD-10-CM | POA: Diagnosis not present

## 2018-09-22 DIAGNOSIS — E11621 Type 2 diabetes mellitus with foot ulcer: Secondary | ICD-10-CM | POA: Diagnosis not present

## 2018-09-22 DIAGNOSIS — L97514 Non-pressure chronic ulcer of other part of right foot with necrosis of bone: Secondary | ICD-10-CM | POA: Diagnosis not present

## 2018-09-22 DIAGNOSIS — M05841 Other rheumatoid arthritis with rheumatoid factor of right hand: Secondary | ICD-10-CM | POA: Diagnosis not present

## 2018-09-22 DIAGNOSIS — F0390 Unspecified dementia without behavioral disturbance: Secondary | ICD-10-CM | POA: Diagnosis not present

## 2018-09-22 DIAGNOSIS — E43 Unspecified severe protein-calorie malnutrition: Secondary | ICD-10-CM | POA: Diagnosis not present

## 2018-09-26 DIAGNOSIS — E43 Unspecified severe protein-calorie malnutrition: Secondary | ICD-10-CM | POA: Diagnosis not present

## 2018-09-26 DIAGNOSIS — M05842 Other rheumatoid arthritis with rheumatoid factor of left hand: Secondary | ICD-10-CM | POA: Diagnosis not present

## 2018-09-26 DIAGNOSIS — M05841 Other rheumatoid arthritis with rheumatoid factor of right hand: Secondary | ICD-10-CM | POA: Diagnosis not present

## 2018-09-26 DIAGNOSIS — F0390 Unspecified dementia without behavioral disturbance: Secondary | ICD-10-CM | POA: Diagnosis not present

## 2018-09-26 DIAGNOSIS — E11621 Type 2 diabetes mellitus with foot ulcer: Secondary | ICD-10-CM | POA: Diagnosis not present

## 2018-09-26 DIAGNOSIS — L97514 Non-pressure chronic ulcer of other part of right foot with necrosis of bone: Secondary | ICD-10-CM | POA: Diagnosis not present

## 2018-09-29 DIAGNOSIS — F0390 Unspecified dementia without behavioral disturbance: Secondary | ICD-10-CM | POA: Diagnosis not present

## 2018-09-29 DIAGNOSIS — E43 Unspecified severe protein-calorie malnutrition: Secondary | ICD-10-CM | POA: Diagnosis not present

## 2018-09-29 DIAGNOSIS — M05842 Other rheumatoid arthritis with rheumatoid factor of left hand: Secondary | ICD-10-CM | POA: Diagnosis not present

## 2018-09-29 DIAGNOSIS — M05841 Other rheumatoid arthritis with rheumatoid factor of right hand: Secondary | ICD-10-CM | POA: Diagnosis not present

## 2018-09-29 DIAGNOSIS — L97514 Non-pressure chronic ulcer of other part of right foot with necrosis of bone: Secondary | ICD-10-CM | POA: Diagnosis not present

## 2018-09-29 DIAGNOSIS — E11621 Type 2 diabetes mellitus with foot ulcer: Secondary | ICD-10-CM | POA: Diagnosis not present

## 2018-09-30 DIAGNOSIS — L97514 Non-pressure chronic ulcer of other part of right foot with necrosis of bone: Secondary | ICD-10-CM | POA: Diagnosis not present

## 2018-09-30 DIAGNOSIS — M05841 Other rheumatoid arthritis with rheumatoid factor of right hand: Secondary | ICD-10-CM | POA: Diagnosis not present

## 2018-09-30 DIAGNOSIS — E43 Unspecified severe protein-calorie malnutrition: Secondary | ICD-10-CM | POA: Diagnosis not present

## 2018-09-30 DIAGNOSIS — M05842 Other rheumatoid arthritis with rheumatoid factor of left hand: Secondary | ICD-10-CM | POA: Diagnosis not present

## 2018-09-30 DIAGNOSIS — F0390 Unspecified dementia without behavioral disturbance: Secondary | ICD-10-CM | POA: Diagnosis not present

## 2018-09-30 DIAGNOSIS — E11621 Type 2 diabetes mellitus with foot ulcer: Secondary | ICD-10-CM | POA: Diagnosis not present

## 2018-10-01 DIAGNOSIS — E43 Unspecified severe protein-calorie malnutrition: Secondary | ICD-10-CM | POA: Diagnosis not present

## 2018-10-01 DIAGNOSIS — M05841 Other rheumatoid arthritis with rheumatoid factor of right hand: Secondary | ICD-10-CM | POA: Diagnosis not present

## 2018-10-01 DIAGNOSIS — L97514 Non-pressure chronic ulcer of other part of right foot with necrosis of bone: Secondary | ICD-10-CM | POA: Diagnosis not present

## 2018-10-01 DIAGNOSIS — M05842 Other rheumatoid arthritis with rheumatoid factor of left hand: Secondary | ICD-10-CM | POA: Diagnosis not present

## 2018-10-01 DIAGNOSIS — F0390 Unspecified dementia without behavioral disturbance: Secondary | ICD-10-CM | POA: Diagnosis not present

## 2018-10-01 DIAGNOSIS — E11621 Type 2 diabetes mellitus with foot ulcer: Secondary | ICD-10-CM | POA: Diagnosis not present

## 2018-10-02 DIAGNOSIS — L97514 Non-pressure chronic ulcer of other part of right foot with necrosis of bone: Secondary | ICD-10-CM | POA: Diagnosis not present

## 2018-10-02 DIAGNOSIS — M05841 Other rheumatoid arthritis with rheumatoid factor of right hand: Secondary | ICD-10-CM | POA: Diagnosis not present

## 2018-10-02 DIAGNOSIS — E43 Unspecified severe protein-calorie malnutrition: Secondary | ICD-10-CM | POA: Diagnosis not present

## 2018-10-02 DIAGNOSIS — F0390 Unspecified dementia without behavioral disturbance: Secondary | ICD-10-CM | POA: Diagnosis not present

## 2018-10-02 DIAGNOSIS — M05842 Other rheumatoid arthritis with rheumatoid factor of left hand: Secondary | ICD-10-CM | POA: Diagnosis not present

## 2018-10-02 DIAGNOSIS — E11621 Type 2 diabetes mellitus with foot ulcer: Secondary | ICD-10-CM | POA: Diagnosis not present

## 2018-10-03 ENCOUNTER — Encounter (HOSPITAL_BASED_OUTPATIENT_CLINIC_OR_DEPARTMENT_OTHER): Payer: Medicare Other | Attending: Internal Medicine

## 2018-10-03 DIAGNOSIS — M05841 Other rheumatoid arthritis with rheumatoid factor of right hand: Secondary | ICD-10-CM | POA: Diagnosis not present

## 2018-10-03 DIAGNOSIS — L97822 Non-pressure chronic ulcer of other part of left lower leg with fat layer exposed: Secondary | ICD-10-CM | POA: Diagnosis not present

## 2018-10-03 DIAGNOSIS — L97514 Non-pressure chronic ulcer of other part of right foot with necrosis of bone: Secondary | ICD-10-CM | POA: Diagnosis not present

## 2018-10-03 DIAGNOSIS — E11621 Type 2 diabetes mellitus with foot ulcer: Secondary | ICD-10-CM | POA: Diagnosis not present

## 2018-10-03 DIAGNOSIS — E43 Unspecified severe protein-calorie malnutrition: Secondary | ICD-10-CM | POA: Diagnosis not present

## 2018-10-03 DIAGNOSIS — L97212 Non-pressure chronic ulcer of right calf with fat layer exposed: Secondary | ICD-10-CM | POA: Diagnosis not present

## 2018-10-03 DIAGNOSIS — M05842 Other rheumatoid arthritis with rheumatoid factor of left hand: Secondary | ICD-10-CM | POA: Diagnosis not present

## 2018-10-03 DIAGNOSIS — S81811A Laceration without foreign body, right lower leg, initial encounter: Secondary | ICD-10-CM | POA: Diagnosis not present

## 2018-10-03 DIAGNOSIS — E11622 Type 2 diabetes mellitus with other skin ulcer: Secondary | ICD-10-CM | POA: Insufficient documentation

## 2018-10-03 DIAGNOSIS — L97516 Non-pressure chronic ulcer of other part of right foot with bone involvement without evidence of necrosis: Secondary | ICD-10-CM | POA: Insufficient documentation

## 2018-10-03 DIAGNOSIS — I10 Essential (primary) hypertension: Secondary | ICD-10-CM | POA: Diagnosis not present

## 2018-10-03 DIAGNOSIS — S81812A Laceration without foreign body, left lower leg, initial encounter: Secondary | ICD-10-CM | POA: Diagnosis not present

## 2018-10-03 DIAGNOSIS — S91104A Unspecified open wound of right lesser toe(s) without damage to nail, initial encounter: Secondary | ICD-10-CM | POA: Diagnosis not present

## 2018-10-03 DIAGNOSIS — F0390 Unspecified dementia without behavioral disturbance: Secondary | ICD-10-CM | POA: Diagnosis not present

## 2018-10-03 DIAGNOSIS — E1142 Type 2 diabetes mellitus with diabetic polyneuropathy: Secondary | ICD-10-CM | POA: Insufficient documentation

## 2018-10-07 DIAGNOSIS — L97514 Non-pressure chronic ulcer of other part of right foot with necrosis of bone: Secondary | ICD-10-CM | POA: Diagnosis not present

## 2018-10-07 DIAGNOSIS — E11621 Type 2 diabetes mellitus with foot ulcer: Secondary | ICD-10-CM | POA: Diagnosis not present

## 2018-10-07 DIAGNOSIS — E43 Unspecified severe protein-calorie malnutrition: Secondary | ICD-10-CM | POA: Diagnosis not present

## 2018-10-07 DIAGNOSIS — M05841 Other rheumatoid arthritis with rheumatoid factor of right hand: Secondary | ICD-10-CM | POA: Diagnosis not present

## 2018-10-07 DIAGNOSIS — M05842 Other rheumatoid arthritis with rheumatoid factor of left hand: Secondary | ICD-10-CM | POA: Diagnosis not present

## 2018-10-07 DIAGNOSIS — F0390 Unspecified dementia without behavioral disturbance: Secondary | ICD-10-CM | POA: Diagnosis not present

## 2018-10-10 DIAGNOSIS — L97514 Non-pressure chronic ulcer of other part of right foot with necrosis of bone: Secondary | ICD-10-CM | POA: Diagnosis not present

## 2018-10-10 DIAGNOSIS — E43 Unspecified severe protein-calorie malnutrition: Secondary | ICD-10-CM | POA: Diagnosis not present

## 2018-10-10 DIAGNOSIS — M05842 Other rheumatoid arthritis with rheumatoid factor of left hand: Secondary | ICD-10-CM | POA: Diagnosis not present

## 2018-10-10 DIAGNOSIS — F0390 Unspecified dementia without behavioral disturbance: Secondary | ICD-10-CM | POA: Diagnosis not present

## 2018-10-10 DIAGNOSIS — E11621 Type 2 diabetes mellitus with foot ulcer: Secondary | ICD-10-CM | POA: Diagnosis not present

## 2018-10-10 DIAGNOSIS — M05841 Other rheumatoid arthritis with rheumatoid factor of right hand: Secondary | ICD-10-CM | POA: Diagnosis not present

## 2018-10-13 DIAGNOSIS — M05841 Other rheumatoid arthritis with rheumatoid factor of right hand: Secondary | ICD-10-CM | POA: Diagnosis not present

## 2018-10-13 DIAGNOSIS — M05842 Other rheumatoid arthritis with rheumatoid factor of left hand: Secondary | ICD-10-CM | POA: Diagnosis not present

## 2018-10-13 DIAGNOSIS — E11621 Type 2 diabetes mellitus with foot ulcer: Secondary | ICD-10-CM | POA: Diagnosis not present

## 2018-10-13 DIAGNOSIS — L97514 Non-pressure chronic ulcer of other part of right foot with necrosis of bone: Secondary | ICD-10-CM | POA: Diagnosis not present

## 2018-10-13 DIAGNOSIS — F0390 Unspecified dementia without behavioral disturbance: Secondary | ICD-10-CM | POA: Diagnosis not present

## 2018-10-13 DIAGNOSIS — E43 Unspecified severe protein-calorie malnutrition: Secondary | ICD-10-CM | POA: Diagnosis not present

## 2018-10-15 DIAGNOSIS — E43 Unspecified severe protein-calorie malnutrition: Secondary | ICD-10-CM | POA: Diagnosis not present

## 2018-10-15 DIAGNOSIS — M05841 Other rheumatoid arthritis with rheumatoid factor of right hand: Secondary | ICD-10-CM | POA: Diagnosis not present

## 2018-10-15 DIAGNOSIS — M05842 Other rheumatoid arthritis with rheumatoid factor of left hand: Secondary | ICD-10-CM | POA: Diagnosis not present

## 2018-10-15 DIAGNOSIS — F0390 Unspecified dementia without behavioral disturbance: Secondary | ICD-10-CM | POA: Diagnosis not present

## 2018-10-15 DIAGNOSIS — E11621 Type 2 diabetes mellitus with foot ulcer: Secondary | ICD-10-CM | POA: Diagnosis not present

## 2018-10-15 DIAGNOSIS — L97514 Non-pressure chronic ulcer of other part of right foot with necrosis of bone: Secondary | ICD-10-CM | POA: Diagnosis not present

## 2018-10-16 DIAGNOSIS — M05841 Other rheumatoid arthritis with rheumatoid factor of right hand: Secondary | ICD-10-CM | POA: Diagnosis not present

## 2018-10-16 DIAGNOSIS — L97514 Non-pressure chronic ulcer of other part of right foot with necrosis of bone: Secondary | ICD-10-CM | POA: Diagnosis not present

## 2018-10-16 DIAGNOSIS — E11621 Type 2 diabetes mellitus with foot ulcer: Secondary | ICD-10-CM | POA: Diagnosis not present

## 2018-10-16 DIAGNOSIS — E43 Unspecified severe protein-calorie malnutrition: Secondary | ICD-10-CM | POA: Diagnosis not present

## 2018-10-16 DIAGNOSIS — F0390 Unspecified dementia without behavioral disturbance: Secondary | ICD-10-CM | POA: Diagnosis not present

## 2018-10-16 DIAGNOSIS — M05842 Other rheumatoid arthritis with rheumatoid factor of left hand: Secondary | ICD-10-CM | POA: Diagnosis not present

## 2018-10-17 DIAGNOSIS — K922 Gastrointestinal hemorrhage, unspecified: Secondary | ICD-10-CM | POA: Diagnosis not present

## 2018-10-17 DIAGNOSIS — M05842 Other rheumatoid arthritis with rheumatoid factor of left hand: Secondary | ICD-10-CM | POA: Diagnosis not present

## 2018-10-17 DIAGNOSIS — L97513 Non-pressure chronic ulcer of other part of right foot with necrosis of muscle: Secondary | ICD-10-CM | POA: Diagnosis not present

## 2018-10-17 DIAGNOSIS — E11621 Type 2 diabetes mellitus with foot ulcer: Secondary | ICD-10-CM | POA: Diagnosis not present

## 2018-10-17 DIAGNOSIS — I482 Chronic atrial fibrillation, unspecified: Secondary | ICD-10-CM | POA: Diagnosis not present

## 2018-10-17 DIAGNOSIS — E43 Unspecified severe protein-calorie malnutrition: Secondary | ICD-10-CM | POA: Diagnosis not present

## 2018-10-17 DIAGNOSIS — M05841 Other rheumatoid arthritis with rheumatoid factor of right hand: Secondary | ICD-10-CM | POA: Diagnosis not present

## 2018-10-17 DIAGNOSIS — L97514 Non-pressure chronic ulcer of other part of right foot with necrosis of bone: Secondary | ICD-10-CM | POA: Diagnosis not present

## 2018-10-17 DIAGNOSIS — F0390 Unspecified dementia without behavioral disturbance: Secondary | ICD-10-CM | POA: Diagnosis not present

## 2018-10-21 DIAGNOSIS — H353132 Nonexudative age-related macular degeneration, bilateral, intermediate dry stage: Secondary | ICD-10-CM | POA: Diagnosis not present

## 2018-10-21 DIAGNOSIS — Z79899 Other long term (current) drug therapy: Secondary | ICD-10-CM | POA: Diagnosis not present

## 2018-10-21 DIAGNOSIS — H52203 Unspecified astigmatism, bilateral: Secondary | ICD-10-CM | POA: Diagnosis not present

## 2018-10-21 DIAGNOSIS — E119 Type 2 diabetes mellitus without complications: Secondary | ICD-10-CM | POA: Diagnosis not present

## 2018-10-22 DIAGNOSIS — L97514 Non-pressure chronic ulcer of other part of right foot with necrosis of bone: Secondary | ICD-10-CM | POA: Diagnosis not present

## 2018-10-22 DIAGNOSIS — F0390 Unspecified dementia without behavioral disturbance: Secondary | ICD-10-CM | POA: Diagnosis not present

## 2018-10-22 DIAGNOSIS — E11621 Type 2 diabetes mellitus with foot ulcer: Secondary | ICD-10-CM | POA: Diagnosis not present

## 2018-10-22 DIAGNOSIS — M05842 Other rheumatoid arthritis with rheumatoid factor of left hand: Secondary | ICD-10-CM | POA: Diagnosis not present

## 2018-10-22 DIAGNOSIS — M05841 Other rheumatoid arthritis with rheumatoid factor of right hand: Secondary | ICD-10-CM | POA: Diagnosis not present

## 2018-10-22 DIAGNOSIS — E43 Unspecified severe protein-calorie malnutrition: Secondary | ICD-10-CM | POA: Diagnosis not present

## 2018-10-23 DIAGNOSIS — E11622 Type 2 diabetes mellitus with other skin ulcer: Secondary | ICD-10-CM | POA: Diagnosis not present

## 2018-10-23 DIAGNOSIS — S91104A Unspecified open wound of right lesser toe(s) without damage to nail, initial encounter: Secondary | ICD-10-CM | POA: Diagnosis not present

## 2018-10-23 DIAGNOSIS — E11621 Type 2 diabetes mellitus with foot ulcer: Secondary | ICD-10-CM | POA: Diagnosis not present

## 2018-10-23 DIAGNOSIS — S81812A Laceration without foreign body, left lower leg, initial encounter: Secondary | ICD-10-CM | POA: Diagnosis not present

## 2018-10-23 DIAGNOSIS — E1142 Type 2 diabetes mellitus with diabetic polyneuropathy: Secondary | ICD-10-CM | POA: Diagnosis not present

## 2018-10-23 DIAGNOSIS — L97516 Non-pressure chronic ulcer of other part of right foot with bone involvement without evidence of necrosis: Secondary | ICD-10-CM | POA: Diagnosis not present

## 2018-10-23 DIAGNOSIS — S81811A Laceration without foreign body, right lower leg, initial encounter: Secondary | ICD-10-CM | POA: Diagnosis not present

## 2018-10-23 DIAGNOSIS — I10 Essential (primary) hypertension: Secondary | ICD-10-CM | POA: Diagnosis not present

## 2018-10-23 DIAGNOSIS — L97822 Non-pressure chronic ulcer of other part of left lower leg with fat layer exposed: Secondary | ICD-10-CM | POA: Diagnosis not present

## 2018-10-24 DIAGNOSIS — L97514 Non-pressure chronic ulcer of other part of right foot with necrosis of bone: Secondary | ICD-10-CM | POA: Diagnosis not present

## 2018-10-24 DIAGNOSIS — E43 Unspecified severe protein-calorie malnutrition: Secondary | ICD-10-CM | POA: Diagnosis not present

## 2018-10-24 DIAGNOSIS — E11621 Type 2 diabetes mellitus with foot ulcer: Secondary | ICD-10-CM | POA: Diagnosis not present

## 2018-10-24 DIAGNOSIS — M05842 Other rheumatoid arthritis with rheumatoid factor of left hand: Secondary | ICD-10-CM | POA: Diagnosis not present

## 2018-10-24 DIAGNOSIS — M05841 Other rheumatoid arthritis with rheumatoid factor of right hand: Secondary | ICD-10-CM | POA: Diagnosis not present

## 2018-10-24 DIAGNOSIS — F0390 Unspecified dementia without behavioral disturbance: Secondary | ICD-10-CM | POA: Diagnosis not present

## 2018-10-28 DIAGNOSIS — M79643 Pain in unspecified hand: Secondary | ICD-10-CM | POA: Diagnosis not present

## 2018-10-28 DIAGNOSIS — E43 Unspecified severe protein-calorie malnutrition: Secondary | ICD-10-CM | POA: Diagnosis not present

## 2018-10-28 DIAGNOSIS — M05842 Other rheumatoid arthritis with rheumatoid factor of left hand: Secondary | ICD-10-CM | POA: Diagnosis not present

## 2018-10-28 DIAGNOSIS — D649 Anemia, unspecified: Secondary | ICD-10-CM | POA: Diagnosis not present

## 2018-10-28 DIAGNOSIS — M810 Age-related osteoporosis without current pathological fracture: Secondary | ICD-10-CM | POA: Diagnosis not present

## 2018-10-28 DIAGNOSIS — F0390 Unspecified dementia without behavioral disturbance: Secondary | ICD-10-CM | POA: Diagnosis not present

## 2018-10-28 DIAGNOSIS — M05841 Other rheumatoid arthritis with rheumatoid factor of right hand: Secondary | ICD-10-CM | POA: Diagnosis not present

## 2018-10-28 DIAGNOSIS — L97514 Non-pressure chronic ulcer of other part of right foot with necrosis of bone: Secondary | ICD-10-CM | POA: Diagnosis not present

## 2018-10-28 DIAGNOSIS — E11621 Type 2 diabetes mellitus with foot ulcer: Secondary | ICD-10-CM | POA: Diagnosis not present

## 2018-10-28 DIAGNOSIS — Z79899 Other long term (current) drug therapy: Secondary | ICD-10-CM | POA: Diagnosis not present

## 2018-10-28 DIAGNOSIS — M0579 Rheumatoid arthritis with rheumatoid factor of multiple sites without organ or systems involvement: Secondary | ICD-10-CM | POA: Diagnosis not present

## 2018-10-29 DIAGNOSIS — M05842 Other rheumatoid arthritis with rheumatoid factor of left hand: Secondary | ICD-10-CM | POA: Diagnosis not present

## 2018-10-29 DIAGNOSIS — E11621 Type 2 diabetes mellitus with foot ulcer: Secondary | ICD-10-CM | POA: Diagnosis not present

## 2018-10-29 DIAGNOSIS — E43 Unspecified severe protein-calorie malnutrition: Secondary | ICD-10-CM | POA: Diagnosis not present

## 2018-10-29 DIAGNOSIS — F0390 Unspecified dementia without behavioral disturbance: Secondary | ICD-10-CM | POA: Diagnosis not present

## 2018-10-29 DIAGNOSIS — M05841 Other rheumatoid arthritis with rheumatoid factor of right hand: Secondary | ICD-10-CM | POA: Diagnosis not present

## 2018-10-29 DIAGNOSIS — L97514 Non-pressure chronic ulcer of other part of right foot with necrosis of bone: Secondary | ICD-10-CM | POA: Diagnosis not present

## 2018-10-30 ENCOUNTER — Encounter (HOSPITAL_BASED_OUTPATIENT_CLINIC_OR_DEPARTMENT_OTHER): Payer: Medicare Other | Attending: Internal Medicine

## 2018-10-30 DIAGNOSIS — L97211 Non-pressure chronic ulcer of right calf limited to breakdown of skin: Secondary | ICD-10-CM | POA: Insufficient documentation

## 2018-10-30 DIAGNOSIS — E11622 Type 2 diabetes mellitus with other skin ulcer: Secondary | ICD-10-CM | POA: Insufficient documentation

## 2018-10-30 DIAGNOSIS — M2041 Other hammer toe(s) (acquired), right foot: Secondary | ICD-10-CM | POA: Insufficient documentation

## 2018-10-30 DIAGNOSIS — M069 Rheumatoid arthritis, unspecified: Secondary | ICD-10-CM | POA: Insufficient documentation

## 2018-10-30 DIAGNOSIS — L97516 Non-pressure chronic ulcer of other part of right foot with bone involvement without evidence of necrosis: Secondary | ICD-10-CM | POA: Insufficient documentation

## 2018-10-30 DIAGNOSIS — I10 Essential (primary) hypertension: Secondary | ICD-10-CM | POA: Diagnosis not present

## 2018-10-30 DIAGNOSIS — E1142 Type 2 diabetes mellitus with diabetic polyneuropathy: Secondary | ICD-10-CM | POA: Diagnosis not present

## 2018-10-30 DIAGNOSIS — S81811A Laceration without foreign body, right lower leg, initial encounter: Secondary | ICD-10-CM | POA: Diagnosis not present

## 2018-10-30 DIAGNOSIS — E11621 Type 2 diabetes mellitus with foot ulcer: Secondary | ICD-10-CM | POA: Diagnosis not present

## 2018-11-03 DIAGNOSIS — L97514 Non-pressure chronic ulcer of other part of right foot with necrosis of bone: Secondary | ICD-10-CM | POA: Diagnosis not present

## 2018-11-03 DIAGNOSIS — F0390 Unspecified dementia without behavioral disturbance: Secondary | ICD-10-CM | POA: Diagnosis not present

## 2018-11-03 DIAGNOSIS — E11621 Type 2 diabetes mellitus with foot ulcer: Secondary | ICD-10-CM | POA: Diagnosis not present

## 2018-11-03 DIAGNOSIS — M05842 Other rheumatoid arthritis with rheumatoid factor of left hand: Secondary | ICD-10-CM | POA: Diagnosis not present

## 2018-11-03 DIAGNOSIS — M05841 Other rheumatoid arthritis with rheumatoid factor of right hand: Secondary | ICD-10-CM | POA: Diagnosis not present

## 2018-11-03 DIAGNOSIS — E43 Unspecified severe protein-calorie malnutrition: Secondary | ICD-10-CM | POA: Diagnosis not present

## 2018-11-06 DIAGNOSIS — M05841 Other rheumatoid arthritis with rheumatoid factor of right hand: Secondary | ICD-10-CM | POA: Diagnosis not present

## 2018-11-06 DIAGNOSIS — E1142 Type 2 diabetes mellitus with diabetic polyneuropathy: Secondary | ICD-10-CM | POA: Diagnosis not present

## 2018-11-06 DIAGNOSIS — L97514 Non-pressure chronic ulcer of other part of right foot with necrosis of bone: Secondary | ICD-10-CM | POA: Diagnosis not present

## 2018-11-06 DIAGNOSIS — E43 Unspecified severe protein-calorie malnutrition: Secondary | ICD-10-CM | POA: Diagnosis not present

## 2018-11-06 DIAGNOSIS — L97211 Non-pressure chronic ulcer of right calf limited to breakdown of skin: Secondary | ICD-10-CM | POA: Diagnosis not present

## 2018-11-06 DIAGNOSIS — I10 Essential (primary) hypertension: Secondary | ICD-10-CM | POA: Diagnosis not present

## 2018-11-06 DIAGNOSIS — F0390 Unspecified dementia without behavioral disturbance: Secondary | ICD-10-CM | POA: Diagnosis not present

## 2018-11-06 DIAGNOSIS — M05842 Other rheumatoid arthritis with rheumatoid factor of left hand: Secondary | ICD-10-CM | POA: Diagnosis not present

## 2018-11-06 DIAGNOSIS — E11622 Type 2 diabetes mellitus with other skin ulcer: Secondary | ICD-10-CM | POA: Diagnosis not present

## 2018-11-06 DIAGNOSIS — E11621 Type 2 diabetes mellitus with foot ulcer: Secondary | ICD-10-CM | POA: Diagnosis not present

## 2018-11-06 DIAGNOSIS — S81811A Laceration without foreign body, right lower leg, initial encounter: Secondary | ICD-10-CM | POA: Diagnosis not present

## 2018-11-06 DIAGNOSIS — L97516 Non-pressure chronic ulcer of other part of right foot with bone involvement without evidence of necrosis: Secondary | ICD-10-CM | POA: Diagnosis not present

## 2018-11-07 DIAGNOSIS — E43 Unspecified severe protein-calorie malnutrition: Secondary | ICD-10-CM | POA: Diagnosis not present

## 2018-11-07 DIAGNOSIS — F0390 Unspecified dementia without behavioral disturbance: Secondary | ICD-10-CM | POA: Diagnosis not present

## 2018-11-07 DIAGNOSIS — L97514 Non-pressure chronic ulcer of other part of right foot with necrosis of bone: Secondary | ICD-10-CM | POA: Diagnosis not present

## 2018-11-07 DIAGNOSIS — M05842 Other rheumatoid arthritis with rheumatoid factor of left hand: Secondary | ICD-10-CM | POA: Diagnosis not present

## 2018-11-07 DIAGNOSIS — M05841 Other rheumatoid arthritis with rheumatoid factor of right hand: Secondary | ICD-10-CM | POA: Diagnosis not present

## 2018-11-07 DIAGNOSIS — E11621 Type 2 diabetes mellitus with foot ulcer: Secondary | ICD-10-CM | POA: Diagnosis not present

## 2018-11-10 DIAGNOSIS — F0390 Unspecified dementia without behavioral disturbance: Secondary | ICD-10-CM | POA: Diagnosis not present

## 2018-11-10 DIAGNOSIS — M05841 Other rheumatoid arthritis with rheumatoid factor of right hand: Secondary | ICD-10-CM | POA: Diagnosis not present

## 2018-11-10 DIAGNOSIS — E43 Unspecified severe protein-calorie malnutrition: Secondary | ICD-10-CM | POA: Diagnosis not present

## 2018-11-10 DIAGNOSIS — E11621 Type 2 diabetes mellitus with foot ulcer: Secondary | ICD-10-CM | POA: Diagnosis not present

## 2018-11-10 DIAGNOSIS — L97514 Non-pressure chronic ulcer of other part of right foot with necrosis of bone: Secondary | ICD-10-CM | POA: Diagnosis not present

## 2018-11-10 DIAGNOSIS — M05842 Other rheumatoid arthritis with rheumatoid factor of left hand: Secondary | ICD-10-CM | POA: Diagnosis not present

## 2018-11-11 ENCOUNTER — Other Ambulatory Visit: Payer: Self-pay

## 2018-11-11 ENCOUNTER — Emergency Department (HOSPITAL_COMMUNITY): Payer: Medicare Other

## 2018-11-11 ENCOUNTER — Encounter (HOSPITAL_COMMUNITY): Payer: Self-pay

## 2018-11-11 ENCOUNTER — Inpatient Hospital Stay (HOSPITAL_COMMUNITY)
Admission: EM | Admit: 2018-11-11 | Discharge: 2018-11-14 | DRG: 070 | Disposition: A | Discharge status: Home Health | Payer: Medicare Other | Attending: Internal Medicine | Admitting: Internal Medicine

## 2018-11-11 DIAGNOSIS — I10 Essential (primary) hypertension: Secondary | ICD-10-CM | POA: Diagnosis present

## 2018-11-11 DIAGNOSIS — E039 Hypothyroidism, unspecified: Secondary | ICD-10-CM | POA: Diagnosis present

## 2018-11-11 DIAGNOSIS — Z681 Body mass index (BMI) 19 or less, adult: Secondary | ICD-10-CM

## 2018-11-11 DIAGNOSIS — M05841 Other rheumatoid arthritis with rheumatoid factor of right hand: Secondary | ICD-10-CM | POA: Diagnosis not present

## 2018-11-11 DIAGNOSIS — G9341 Metabolic encephalopathy: Secondary | ICD-10-CM | POA: Diagnosis not present

## 2018-11-11 DIAGNOSIS — M069 Rheumatoid arthritis, unspecified: Secondary | ICD-10-CM | POA: Diagnosis present

## 2018-11-11 DIAGNOSIS — D649 Anemia, unspecified: Secondary | ICD-10-CM

## 2018-11-11 DIAGNOSIS — E11621 Type 2 diabetes mellitus with foot ulcer: Secondary | ICD-10-CM | POA: Diagnosis not present

## 2018-11-11 DIAGNOSIS — G9340 Encephalopathy, unspecified: Secondary | ICD-10-CM | POA: Diagnosis present

## 2018-11-11 DIAGNOSIS — N390 Urinary tract infection, site not specified: Secondary | ICD-10-CM | POA: Diagnosis not present

## 2018-11-11 DIAGNOSIS — I499 Cardiac arrhythmia, unspecified: Secondary | ICD-10-CM | POA: Diagnosis not present

## 2018-11-11 DIAGNOSIS — Z7984 Long term (current) use of oral hypoglycemic drugs: Secondary | ICD-10-CM

## 2018-11-11 DIAGNOSIS — Z7952 Long term (current) use of systemic steroids: Secondary | ICD-10-CM

## 2018-11-11 DIAGNOSIS — E43 Unspecified severe protein-calorie malnutrition: Secondary | ICD-10-CM | POA: Diagnosis not present

## 2018-11-11 DIAGNOSIS — L97514 Non-pressure chronic ulcer of other part of right foot with necrosis of bone: Secondary | ICD-10-CM | POA: Diagnosis not present

## 2018-11-11 DIAGNOSIS — I959 Hypotension, unspecified: Secondary | ICD-10-CM | POA: Diagnosis not present

## 2018-11-11 DIAGNOSIS — M05842 Other rheumatoid arthritis with rheumatoid factor of left hand: Secondary | ICD-10-CM | POA: Diagnosis not present

## 2018-11-11 DIAGNOSIS — R4020 Unspecified coma: Secondary | ICD-10-CM | POA: Diagnosis not present

## 2018-11-11 DIAGNOSIS — I4891 Unspecified atrial fibrillation: Secondary | ICD-10-CM | POA: Diagnosis present

## 2018-11-11 DIAGNOSIS — R4182 Altered mental status, unspecified: Secondary | ICD-10-CM | POA: Diagnosis not present

## 2018-11-11 DIAGNOSIS — D509 Iron deficiency anemia, unspecified: Secondary | ICD-10-CM | POA: Diagnosis present

## 2018-11-11 DIAGNOSIS — E1169 Type 2 diabetes mellitus with other specified complication: Secondary | ICD-10-CM | POA: Diagnosis present

## 2018-11-11 DIAGNOSIS — K449 Diaphragmatic hernia without obstruction or gangrene: Secondary | ICD-10-CM | POA: Diagnosis present

## 2018-11-11 DIAGNOSIS — Z79890 Hormone replacement therapy (postmenopausal): Secondary | ICD-10-CM

## 2018-11-11 DIAGNOSIS — F0390 Unspecified dementia without behavioral disturbance: Secondary | ICD-10-CM | POA: Diagnosis present

## 2018-11-11 DIAGNOSIS — M05741 Rheumatoid arthritis with rheumatoid factor of right hand without organ or systems involvement: Secondary | ICD-10-CM | POA: Diagnosis present

## 2018-11-11 DIAGNOSIS — R64 Cachexia: Secondary | ICD-10-CM | POA: Diagnosis present

## 2018-11-11 DIAGNOSIS — M05742 Rheumatoid arthritis with rheumatoid factor of left hand without organ or systems involvement: Secondary | ICD-10-CM

## 2018-11-11 DIAGNOSIS — K219 Gastro-esophageal reflux disease without esophagitis: Secondary | ICD-10-CM | POA: Diagnosis present

## 2018-11-11 DIAGNOSIS — B952 Enterococcus as the cause of diseases classified elsewhere: Secondary | ICD-10-CM | POA: Diagnosis present

## 2018-11-11 DIAGNOSIS — Z800 Family history of malignant neoplasm of digestive organs: Secondary | ICD-10-CM

## 2018-11-11 DIAGNOSIS — Z888 Allergy status to other drugs, medicaments and biological substances status: Secondary | ICD-10-CM

## 2018-11-11 DIAGNOSIS — B9620 Unspecified Escherichia coli [E. coli] as the cause of diseases classified elsewhere: Secondary | ICD-10-CM | POA: Diagnosis present

## 2018-11-11 DIAGNOSIS — K5730 Diverticulosis of large intestine without perforation or abscess without bleeding: Secondary | ICD-10-CM | POA: Diagnosis present

## 2018-11-11 DIAGNOSIS — Z993 Dependence on wheelchair: Secondary | ICD-10-CM

## 2018-11-11 DIAGNOSIS — E785 Hyperlipidemia, unspecified: Secondary | ICD-10-CM | POA: Diagnosis present

## 2018-11-11 DIAGNOSIS — Z8719 Personal history of other diseases of the digestive system: Secondary | ICD-10-CM

## 2018-11-11 DIAGNOSIS — R000 Tachycardia, unspecified: Secondary | ICD-10-CM | POA: Diagnosis not present

## 2018-11-11 DIAGNOSIS — R410 Disorientation, unspecified: Secondary | ICD-10-CM | POA: Diagnosis not present

## 2018-11-11 DIAGNOSIS — R0902 Hypoxemia: Secondary | ICD-10-CM | POA: Diagnosis not present

## 2018-11-11 LAB — CBC WITH DIFFERENTIAL/PLATELET
Abs Immature Granulocytes: 0.07 10*3/uL (ref 0.00–0.07)
Basophils Absolute: 0.1 10*3/uL (ref 0.0–0.1)
Basophils Relative: 1 %
Eosinophils Absolute: 0.2 10*3/uL (ref 0.0–0.5)
Eosinophils Relative: 2 %
HCT: 30.3 % — ABNORMAL LOW (ref 36.0–46.0)
Hemoglobin: 9.2 g/dL — ABNORMAL LOW (ref 12.0–15.0)
IMMATURE GRANULOCYTES: 1 %
LYMPHS PCT: 11 %
Lymphs Abs: 0.9 10*3/uL (ref 0.7–4.0)
MCH: 27.6 pg (ref 26.0–34.0)
MCHC: 30.4 g/dL (ref 30.0–36.0)
MCV: 91 fL (ref 80.0–100.0)
Monocytes Absolute: 0.7 10*3/uL (ref 0.1–1.0)
Monocytes Relative: 8 %
NEUTROS ABS: 6.6 10*3/uL (ref 1.7–7.7)
Neutrophils Relative %: 77 %
Platelets: 263 10*3/uL (ref 150–400)
RBC: 3.33 MIL/uL — ABNORMAL LOW (ref 3.87–5.11)
RDW: 17.4 % — ABNORMAL HIGH (ref 11.5–15.5)
WBC: 8.6 10*3/uL (ref 4.0–10.5)
nRBC: 0 % (ref 0.0–0.2)

## 2018-11-11 LAB — COMPREHENSIVE METABOLIC PANEL
ALT: 10 U/L (ref 0–44)
AST: 18 U/L (ref 15–41)
Albumin: 2.8 g/dL — ABNORMAL LOW (ref 3.5–5.0)
Alkaline Phosphatase: 82 U/L (ref 38–126)
Anion gap: 14 (ref 5–15)
BUN: 12 mg/dL (ref 8–23)
CHLORIDE: 99 mmol/L (ref 98–111)
CO2: 23 mmol/L (ref 22–32)
Calcium: 7.7 mg/dL — ABNORMAL LOW (ref 8.9–10.3)
Creatinine, Ser: 0.66 mg/dL (ref 0.44–1.00)
GFR calc Af Amer: >60 mL/min (ref >60)
GFR calc non Af Amer: >60 mL/min (ref >60)
Glucose, Bld: 126 mg/dL — ABNORMAL HIGH (ref 70–99)
POTASSIUM: 3.5 mmol/L (ref 3.5–5.1)
Sodium: 136 mmol/L (ref 135–145)
Total Bilirubin: 0.9 mg/dL (ref 0.3–1.2)
Total Protein: 6.5 g/dL (ref 6.5–8.1)

## 2018-11-11 LAB — URINALYSIS, ROUTINE W REFLEX MICROSCOPIC
Bilirubin Urine: NEGATIVE
Glucose, UA: NEGATIVE mg/dL
Ketones, ur: NEGATIVE mg/dL
Nitrite: NEGATIVE
PH: 5 (ref 5.0–8.0)
Protein, ur: NEGATIVE mg/dL
Specific Gravity, Urine: 1.013 (ref 1.005–1.030)
WBC, UA: >50 WBC/hpf — ABNORMAL HIGH (ref 0–5)

## 2018-11-11 LAB — LACTIC ACID, PLASMA: Lactic Acid, Venous: 1.6 mmol/L (ref 0.5–1.9)

## 2018-11-11 LAB — GLUCOSE, CAPILLARY: Glucose-Capillary: 157 mg/dL — ABNORMAL HIGH (ref 70–99)

## 2018-11-11 LAB — POC OCCULT BLOOD, ED: Fecal Occult Bld: NEGATIVE

## 2018-11-11 LAB — INFLUENZA PANEL BY PCR (TYPE A & B)
Influenza A By PCR: NEGATIVE
Influenza B By PCR: NEGATIVE

## 2018-11-11 MED ORDER — MIRTAZAPINE 15 MG PO TABS
7.5000 mg | ORAL_TABLET | Freq: Every day | ORAL | Status: DC
  Administered 2018-11-11 – 2018-11-13 (×3): 7.5 mg via ORAL
  Filled 2018-11-11 (×3): qty 1

## 2018-11-11 MED ORDER — FLUCONAZOLE 100 MG PO TABS
200.0000 mg | ORAL_TABLET | Freq: Every day | ORAL | Status: DC
  Administered 2018-11-11 – 2018-11-13 (×3): 200 mg via ORAL
  Filled 2018-11-11 (×3): qty 2

## 2018-11-11 MED ORDER — ENOXAPARIN SODIUM 30 MG/0.3ML ~~LOC~~ SOLN
30.0000 mg | SUBCUTANEOUS | Status: DC
  Administered 2018-11-11 – 2018-11-12 (×2): 30 mg via SUBCUTANEOUS
  Filled 2018-11-11 (×3): qty 0.3

## 2018-11-11 MED ORDER — INSULIN ASPART 100 UNIT/ML ~~LOC~~ SOLN
0.0000 [IU] | Freq: Every day | SUBCUTANEOUS | Status: DC
  Administered 2018-11-12: 2 [IU] via SUBCUTANEOUS

## 2018-11-11 MED ORDER — SODIUM CHLORIDE 0.9 % IV SOLN
1000.0000 mL | INTRAVENOUS | Status: DC
  Administered 2018-11-11: 1000 mL via INTRAVENOUS

## 2018-11-11 MED ORDER — ACETAMINOPHEN 325 MG PO TABS: 650.0000 mg | ORAL_TABLET | Freq: Four times a day (QID) | ORAL | Status: DC | PRN

## 2018-11-11 MED ORDER — PREDNISONE 1 MG PO TABS
4.0000 mg | ORAL_TABLET | Freq: Every day | ORAL | Status: DC
  Administered 2018-11-12 – 2018-11-13 (×2): 4 mg via ORAL
  Filled 2018-11-11 (×3): qty 4

## 2018-11-11 MED ORDER — ACETAMINOPHEN 650 MG RE SUPP: 650.0000 mg | Freq: Four times a day (QID) | RECTAL | Status: DC | PRN

## 2018-11-11 MED ORDER — MIRABEGRON ER 25 MG PO TB24
25.0000 mg | ORAL_TABLET | Freq: Every day | ORAL | Status: DC
  Administered 2018-11-11 – 2018-11-13 (×3): 25 mg via ORAL
  Filled 2018-11-11 (×4): qty 1

## 2018-11-11 MED ORDER — FOLIC ACID 1 MG PO TABS
1.0000 mg | ORAL_TABLET | Freq: Every day | ORAL | Status: DC
  Administered 2018-11-11 – 2018-11-13 (×3): 1 mg via ORAL
  Filled 2018-11-11 (×3): qty 1

## 2018-11-11 MED ORDER — SODIUM CHLORIDE 0.9 % IV SOLN
1.0000 g | INTRAVENOUS | Status: DC
  Filled 2018-11-11: qty 10

## 2018-11-11 MED ORDER — INSULIN ASPART 100 UNIT/ML ~~LOC~~ SOLN
0.0000 [IU] | Freq: Three times a day (TID) | SUBCUTANEOUS | Status: DC
  Administered 2018-11-12 (×2): 2 [IU] via SUBCUTANEOUS
  Administered 2018-11-13: 1 [IU] via SUBCUTANEOUS
  Administered 2018-11-14: 2 [IU] via SUBCUTANEOUS

## 2018-11-11 MED ORDER — LEFLUNOMIDE 20 MG PO TABS
20.0000 mg | ORAL_TABLET | Freq: Every day | ORAL | Status: DC
  Administered 2018-11-12 – 2018-11-13 (×2): 20 mg via ORAL
  Filled 2018-11-11 (×3): qty 1

## 2018-11-11 MED ORDER — MELATONIN 3 MG PO TABS
3.0000 mg | ORAL_TABLET | Freq: Every day | ORAL | Status: DC
  Administered 2018-11-11 – 2018-11-13 (×2): 3 mg via ORAL
  Filled 2018-11-11 (×3): qty 1

## 2018-11-11 MED ORDER — SODIUM CHLORIDE 0.9 % IV SOLN: 1.0000 g | Freq: Once | INTRAVENOUS | Status: DC

## 2018-11-11 MED ORDER — PANTOPRAZOLE SODIUM 40 MG PO TBEC
40.0000 mg | DELAYED_RELEASE_TABLET | Freq: Two times a day (BID) | ORAL | Status: DC
  Administered 2018-11-11 – 2018-11-13 (×5): 40 mg via ORAL
  Filled 2018-11-11 (×5): qty 1

## 2018-11-11 MED ORDER — LEVOTHYROXINE SODIUM 100 MCG PO TABS
100.0000 ug | ORAL_TABLET | Freq: Every day | ORAL | Status: DC
  Administered 2018-11-13: 100 ug via ORAL
  Filled 2018-11-11: qty 1

## 2018-11-11 NOTE — ED Triage Notes (Signed)
Per EMS: Pt from home, cared for by homehealth.  Pt has been altered since going to bed at 10 pm last night.  Pt hx RA.  Pt has had a loss of appetite since last night.  Pt has a sore to right knee and R toe.

## 2018-11-11 NOTE — Progress Notes (Signed)
Pharmacy Antibiotic Note  [name] is a 85 y.o. female admitted on 11/11/2018 with UTI.  Pharmacy has been consulted for fluconazole dosing. Normalized CrCl 67 ml/min.   Plan: Fluconazole 200 mg po daily Pharmacy to signoff  Height: 5\' 6"  (167.6 cm) Weight: 89 lb 8.1 oz (40.6 kg) IBW/kg (Calculated) : 59.3  Temp (24hrs), Avg:99.8 F (37.7 C), Min:98.9 F (37.2 C), Max:100.3 F (37.9 C)  Recent Labs  Lab 11/11/18 1224 11/11/18 1255  WBC 8.6  --   CREATININE 0.66  --   LATICACIDVEN  --  1.6    Estimated Creatinine Clearance: 31.2 mL/min (by C-G formula based on SCr of 0.66 mg/dL).    Allergies  Allergen Reactions  . Metformin And Related Diarrhea   Thank you for allowing pharmacy to be a part of this patient's care.  Eudelia Bunch, Pharm.D [phone] 11/11/2018 7:36 PM

## 2018-11-11 NOTE — ED Provider Notes (Signed)
[facility] DEPT Provider Note   CSN: [id number] Arrival date & time: 11/11/18  1145    History   Chief Complaint Chief Complaint  Patient presents with  . Altered Mental Status    HPI [name] is a 85 y.o. female presenting for evaluation of altered mental status.  Level 5 caveat, patient altered and with history of dementia.  History provided by caregiver.  Per caregiver, patient has been acutely altered since last night.  Patient is normally "with it," able to state who she is, where she is, understand appropriately.  Today, she has been very confused.  Additionally, she is very tired, and did not eat anything which is abnormal for her.  Caregiver denies recent fall, trauma, or injury.  Patient is wheelchair-bound at baseline.  Caregiver reports fever of 100.7 at home this morning, no fever yesterday.  Caregiver denies cough, complaints of chest pain, nausea, vomiting, abdominal pain, or normal bowel movements.  Patient has urinary incontinence at baseline, no hematuria noted.  Additional history obtained from chart review.  Patient with a history of A. fib, GI bleed, diabetes, hypothyroidism, GERD, diverticulosis, hypertension, RA.  She is on immunosuppression.  She does not take blood thinners.     HPI  Past Medical History:  Diagnosis Date  . Acute bronchitis   . Anemia, unspecified   . Atrial fibrillation ([place])    not on AC due to falls  . Depressive disorder, not elsewhere classified   . Diverticulosis of colon (without mention of hemorrhage)   . Esophageal reflux   . Family history of malignant neoplasm of gastrointestinal tract   . Functional diarrhea   . Hiatal hernia   . History of blood transfusion    "once; related to diverticulitis" (11/29/2017)  . Other and unspecified hyperlipidemia   . Other chest pain   . Other specified cardiac dysrhythmias(427.89)   . Personal history of colonic polyps 07/17/1995   adenomatous  polyps  . PONV (postoperative nausea and vomiting)   . Recurrent UTI (urinary tract infection)    "2-3 times in the last 1 1/69yr" (11/29/2017)  . Rheumatoid arthritis ([place])   . Tachycardia, unspecified   . Type II diabetes mellitus ([place])   . Unspecified adverse effect of unspecified drug, medicinal and biological substance   . Unspecified essential hypertension   . Unspecified hypothyroidism     Patient Active Problem List   Diagnosis Date Noted  . GI bleed 08/08/2018  . Altered mental status, unspecified 06/04/2018  . Acute encephalopathy 06/03/2018  . Atrial fibrillation, chronic 06/03/2018  . Pneumonia 04/26/2018  . TIA (transient ischemic attack) 04/25/2018  . Aphasia   . Pressure injury of skin 02/26/2018  . Hyperkalemia 02/25/2018  . Altered mental status 02/25/2018  . Acute lower UTI 02/25/2018  . Iron deficiency anemia due to chronic blood loss 12/17/2017  . Protein-calorie malnutrition, severe 11/30/2017  . Syncope 11/29/2017  . Diabetic ulcer of left foot associated with type 2 diabetes mellitus ([place]) 08/11/2015  . Closed right hip fracture ([place]) 08/28/2012  . History of gastroesophageal reflux (GERD) 06/26/2011  . Lower GI bleeding 06/26/2011  . DEPRESSION 03/31/2009  . DIARRHEA, FUNCTIONAL 03/31/2009  . DIABETIC HYPOGLYCEMIA, TYPE II 11/03/2008  . ACUTE BRONCHITIS 08/23/2008  . AV NODAL REENTRY TACHYCARDIA 10/29/2007  . TACHYCARDIA 10/29/2007  . CHEST PAIN, ATYPICAL 10/25/2007  . ADVEF, DRUG/MEDICINAL/BIOLOGICAL SUBST NOS 07/16/2007  . Nonspecific (abnormal) findings on radiological and other examination of body structure 04/03/2007  . CHEST  XRAY, ABNORMAL 04/03/2007  . Hypothyroidism (acquired) 03/06/2007  . Type 2 diabetes mellitus with other specified complication ([place]) 63/87/5643  . HYPERLIPIDEMIA 03/06/2007  . Essential hypertension 03/06/2007  . DIVERTICULOSIS, COLON 03/06/2007  . Rheumatoid arthritis involving both hands with positive rheumatoid factor  ([place]) 03/06/2007    Past Surgical History:  Procedure Laterality Date  . ABDOMINAL HYSTERECTOMY     "partial"  . CATARACT EXTRACTION Bilateral   . FOREARM FRACTURE SURGERY Right 1940s?  . FRACTURE SURGERY    . ORIF FEMUR FRACTURE  08/30/2012   Procedure: OPEN REDUCTION INTERNAL FIXATION (ORIF) DISTAL FEMUR FRACTURE;  Surgeon: Mauri Pole, MD;  Location: [facility] ORS;  Service: Orthopedics;  Laterality: Right;  proximal femur  . THYROIDECTOMY, PARTIAL       OB History   No obstetric history on file.      Home Medications    Prior to Admission medications   Medication Sig Start Date End Date Taking? Authorizing Provider  acetaminophen (TYLENOL) 325 MG tablet Take 2 tablets (650 mg total) by mouth every 6 (six) hours as needed for mild pain, fever or headache (or Fever >/= 101). 08/14/18  Yes Emokpae, Courage, MD  benzonatate (TESSALON) 200 MG capsule Take 200 mg by mouth 3 (three) times daily as needed for cough. 08/05/18  Yes [provider]  calcium carbonate (TUMS - DOSED IN MG ELEMENTAL CALCIUM) 500 MG chewable tablet Chew 1 tablet (200 mg of elemental calcium total) by mouth 2 (two) times daily. 08/14/18  Yes Roxan Hockey, MD  collagenase (SANTYL) ointment Apply topically daily. Apply Santyl to right 2nd toe wound Q day, then cover with moist 2X2 and foam dressing.  (Change foam dressing Q 3 days or PRN soiling.) Patient taking differently: Apply 1 application topically as directed. Apply Santyl to right 2nd toe on right foot wound 3xweek, then cover with moist 2X2 and foam dressing.  (Change foam dressing Q 3 days or PRN soiling.) 08/15/18  Yes Emokpae, Courage, MD  feeding supplement, ENSURE ENLIVE, (ENSURE ENLIVE) LIQD Take 237 mLs by mouth 2 (two) times daily between meals. 02/26/18  Yes Lavina Hamman, MD  folic acid (FOLVITE) 1 MG tablet Take 1 mg by mouth daily.  07/16/18  Yes [provider]  leflunomide (ARAVA) 20 MG tablet Take 20 mg by mouth daily.  08/14/17  Yes [provider]  levothyroxine (SYNTHROID) 100 MCG tablet Take 1 tablet (100 mcg total) by mouth daily. 12/07/17 12/07/18 Yes Regalado, Belkys A, MD  magnesium oxide (MAG-OX) 400 (241.3 Mg) MG tablet Take 0.5 tablets (200 mg total) by mouth 2 (two) times daily. 12/07/17  Yes Regalado, Belkys A, MD  Melatonin 2.5 MG CAPS Take 1 capsule (2.5 mg total) by mouth at bedtime as needed (for sleep). Patient taking differently: Take 2.5 mg by mouth at bedtime.  12/07/17  Yes Regalado, Belkys A, MD  metFORMIN (GLUCOPHAGE-XR) 500 MG 24 hr tablet Take 500-1,000 mg by mouth See admin instructions. Take 1000 mg by mouth in the morning and 500 mg by mouth in the evening 03/07/15  Yes [provider]  mirtazapine (REMERON) 7.5 MG tablet Take 7.5 mg by mouth at bedtime. 08/05/18  Yes [provider]  Multiple Vitamins-Minerals (CENTRUM SILVER 50+WOMEN) TABS Take 1 tablet by mouth daily.   Yes [provider]  MYRBETRIQ 25 MG TB24 tablet Take 25 mg by mouth daily. 06/25/18  Yes [provider]  ondansetron (ZOFRAN) 4 MG tablet Take 1 tablet (4 mg total) by  mouth every 6 (six) hours as needed for nausea. 08/14/18  Yes Emokpae, Courage, MD  pantoprazole (PROTONIX) 40 MG tablet Take 1 tablet (40 mg total) by mouth 2 (two) times daily. 08/14/18  Yes Roxan Hockey, MD  Pitavastatin Calcium (LIVALO) 2 MG TABS Take 2 mg by mouth every Monday, Wednesday, and Friday.   Yes [provider]  predniSONE (DELTASONE) 1 MG tablet Take 4 mg by mouth daily with breakfast.    Yes [provider]  TRADJENTA 5 MG TABS tablet Take 5 mg by mouth daily. 01/29/18  Yes [provider]  VOLTAREN 1 % GEL Apply 4 g topically 4 (four) times daily. Apply to Hands and knees 11/30/13  Yes [provider]    Family History Family History  Problem Relation Age of Onset  . Colon cancer Maternal Grandmother     Social History Social History   Tobacco Use    . Smoking status: Never Smoker  . Smokeless tobacco: Never Used  Substance Use Topics  . Alcohol use: Not Currently    Alcohol/week: 1.0 standard drinks    Types: 1 Glasses of wine per week  . Drug use: No     Allergies   Metformin and related   Review of Systems Review of Systems  Unable to perform ROS: Mental status change  Constitutional: Positive for fever.  Neurological: Positive for weakness.     Physical Exam Updated Vital Signs BP 134/79   Pulse 79   Temp 100.1 F (37.8 C) (Oral)   Resp 20   Ht 5\' 6"  (1.676 m)   Wt 40.8 kg   SpO2 97%   BMI 14.53 kg/m   Physical Exam Vitals signs and nursing note reviewed.  Constitutional:      General: She is not in acute distress.    Appearance: She is well-developed.     Comments: Elderly, cachectic female, feels warm to the touch but in no acute distress  HENT:     Head: Normocephalic and atraumatic.     Mouth/Throat:     Mouth: Mucous membranes are dry.  Eyes:     Extraocular Movements: Extraocular movements intact.     Conjunctiva/sclera: Conjunctivae normal.     Pupils: Pupils are equal, round, and reactive to light.  Neck:     Musculoskeletal: Normal range of motion and neck supple.  Cardiovascular:     Rate and Rhythm: Normal rate and regular rhythm.     Pulses: Normal pulses.  Pulmonary:     Effort: Pulmonary effort is normal. No respiratory distress.     Breath sounds: Normal breath sounds. No wheezing.     Comments: Clear lung sounds in all fields Abdominal:     General: There is no distension.     Palpations: Abdomen is soft. There is no mass.     Tenderness: There is no abdominal tenderness. There is no guarding or rebound.  Musculoskeletal: Normal range of motion.  Skin:    General: Skin is warm and dry.     Capillary Refill: Capillary refill takes less than 2 seconds.  Neurological:     Mental Status: She is alert.     Comments: Alert only to person. Occasionally answering questions  appropriately, and occasionally has long, tangential, and rambling answers.      ED Treatments / Results  Labs (all labs ordered are listed, but only abnormal results are displayed) Labs Reviewed  COMPREHENSIVE METABOLIC PANEL - Abnormal; Notable for the following components:  Result Value   Glucose, Bld 126 (*)    Calcium 7.7 (*)    Albumin 2.8 (*)    All other components within normal limits  CBC WITH DIFFERENTIAL/PLATELET - Abnormal; Notable for the following components:   RBC 3.33 (*)    Hemoglobin 9.2 (*)    HCT 30.3 (*)    RDW 17.4 (*)    All other components within normal limits  CULTURE, BLOOD (ROUTINE X 2)  CULTURE, BLOOD (ROUTINE X 2)  URINE CULTURE  LACTIC ACID, PLASMA  LACTIC ACID, PLASMA  URINALYSIS, ROUTINE W REFLEX MICROSCOPIC  INFLUENZA PANEL BY PCR (TYPE A & B)  POC OCCULT BLOOD, ED    EKG EKG Interpretation  Date/Time:  Tuesday November 11 2018 12:10:25 EST Ventricular Rate:  88 PR Interval:    QRS Duration: 88 QT Interval:  335 QTC Calculation: 406 R Axis:   71 Text Interpretation:  Atrial fibrillation LVH with secondary repolarization abnormality Interpretation limited secondary to artifact otherwise appears similar to Dec 2019 Confirmed by Sherwood Gambler [phone]) on 11/11/2018 1:57:28 PM   Radiology Dg Chest 2 View  Result Date: 11/11/2018 CLINICAL DATA:  Fever and confusion EXAM: CHEST - 2 VIEW COMPARISON:  09/12/2018 FINDINGS: Heart size mildly enlarged. Negative for heart failure. Atherosclerotic aortic arch. Lungs are clear without infiltrate or effusion. IMPRESSION: No active cardiopulmonary disease. Electronically Signed   By: Franchot Gallo M.D.   On: 11/11/2018 13:17   Ct Head Wo Contrast  Result Date: 11/11/2018 CLINICAL DATA:  85 year old female with altered level consciousness since last night. Loss of appetite. Initial encounter. EXAM: CT HEAD WITHOUT CONTRAST TECHNIQUE: Contiguous axial images were obtained from the base of the  skull through the vertex without intravenous contrast. COMPARISON:  09/12/2018. FINDINGS: Brain: No intracranial hemorrhage or CT evidence of large acute infarct. Prominent chronic microvascular changes. Global atrophy. No intracranial mass lesion noted on this unenhanced exam. Vascular: Prominent vascular calcifications. No acute hyperdense vessel. Skull: No acute abnormality. Sinuses/Orbits: Post lens replacement. No acute orbital abnormality. Visualized paranasal sinuses clear. Other: Mastoid air cells and middle ear cavities clear. IMPRESSION: 1. No intracranial hemorrhage or CT evidence of large acute infarct. 2. Prominent chronic microvascular changes. 3. Global atrophy. Electronically Signed   By: Genia Del M.D.   On: 11/11/2018 13:33    Procedures Procedures (including critical care time)  Medications Ordered in ED Medications  0.9 %  sodium chloride infusion (1,000 mLs Intravenous New Bag/Given 11/11/18 1549)     Initial Impression / Assessment and Plan / ED Course  I have reviewed the triage vital signs and the nursing notes.  Pertinent labs & imaging results that were available during my care of the patient were reviewed by me and considered in my medical decision making (see chart for details).        Patient presenting for evaluation of confusion and altered mental status.  Physical exam shows elderly cachectic female who is in no acute distress.  Patient feels warm to the touch, oral temperature high normal at 100.1, I suspect her true rectal temperature to be elevated.  Concern for possible infection, will obtain blood work, lactic, chest x-ray, urine, CT head for further evaluation.  Labs without leukocytosis.  Hemoglobin low at 9.2.  Most recently was at 19.  Will obtain Hemoccult, no obvious source of bleeding at this time.  Otherwise labs reassuring.  Hemoccult negative, no obvious cause for drop in hgb.  Lactic reassuring at 1.6.  Chest x-ray viewed interpreted  by me,  no pneumonia, pneumothorax, effusion, cardiomegaly.  CT head without acute findings.  Urine pending. Case discussed with attending, Dr. Regenia Skeeter evaluated the pt.   Urine grossly positive for UTI.  Antibiotics started.  Will call for admission.  Discussed with Dr[facility] Florene Glen [facility], patient to be admitted.   Final Clinical Impressions(s) / ED Diagnoses   Final diagnoses:  Urinary tract infection without hematuria, site unspecified  Altered mental status, unspecified altered mental status type    ED Discharge Orders    None       Franchot Heidelberg, PA-C 11/11/18 1632    Sherwood Gambler, MD 11/14/18 [phone]

## 2018-11-11 NOTE — Progress Notes (Signed)
Called & rec'd report

## 2018-11-11 NOTE — H&P (Signed)
History and Physical    [name] FKC:[id number] DOB: [birth year] DOA: 11/11/2018  PCP: Rhonda Pretty, MD  Patient coming from: home  I have personally briefly reviewed patient's old medical records in [place]  Chief Complaint: confusion  HPI: [name] is a 85 y.o. female with medical history significant of atrial fibrillation, type 2 diabetes, rheumatoid arthritis, and multiple other medical problems presenting with confusion.   History is limited as the patient is unable to give a history with her mental status.  The current caregiver assist with the history.  [name] notes that she helps take care of the patient from Tuesday to Friday.  She states that she is typically a little bit confused on Tuesday, but this improves over the next day or so.  She thinks that this episode is similar to this.  She is normally alert and oriented x3.  She is weak globally.  She is wheelchair-bound.  Any chest pain, shortness of breath, nausea or vomiting.  She does feel confused.  ED Course: labs, imaging, UA, ekg.  Admit for AMS with UTI.  Review of Systems: Unable to obtain due to patients mental status  Past Medical History:  Diagnosis Date  . Acute bronchitis   . Anemia, unspecified   . Atrial fibrillation ([place])    not on AC due to falls  . Depressive disorder, not elsewhere classified   . Diverticulosis of colon (without mention of hemorrhage)   . Esophageal reflux   . Family history of malignant neoplasm of gastrointestinal tract   . Functional diarrhea   . Hiatal hernia   . History of blood transfusion    "once; related to diverticulitis" (11/29/2017)  . Other and unspecified hyperlipidemia   . Other chest pain   . Other specified cardiac dysrhythmias(427.89)   . Personal history of colonic polyps 07/17/1995   adenomatous polyps  . PONV (postoperative nausea and vomiting)   . Recurrent UTI (urinary tract infection)    "2-3 times in the last 1 1/64yr" (11/29/2017)    . Rheumatoid arthritis ([place])   . Tachycardia, unspecified   . Type II diabetes mellitus ([place])   . Unspecified adverse effect of unspecified drug, medicinal and biological substance   . Unspecified essential hypertension   . Unspecified hypothyroidism     Past Surgical History:  Procedure Laterality Date  . ABDOMINAL HYSTERECTOMY     "partial"  . CATARACT EXTRACTION Bilateral   . FOREARM FRACTURE SURGERY Right 1940s?  . FRACTURE SURGERY    . ORIF FEMUR FRACTURE  08/30/2012   Procedure: OPEN REDUCTION INTERNAL FIXATION (ORIF) DISTAL FEMUR FRACTURE;  Surgeon: Mauri Pole, MD;  Location: [facility];  Service: Orthopedics;  Laterality: Right;  proximal femur  . THYROIDECTOMY, PARTIAL       reports that she has never smoked. She has never used smokeless tobacco. She reports previous alcohol use of about 1.0 standard drinks of alcohol per week. She reports that she does not use drugs.  Allergies  Allergen Reactions  . Metformin And Related Diarrhea    Family History  Problem Relation Age of Onset  . Colon cancer Maternal Grandmother     Prior to Admission medications   Medication Sig Start Date End Date Taking? Authorizing Provider  acetaminophen (TYLENOL) 325 MG tablet Take 2 tablets (650 mg total) by mouth every 6 (six) hours as needed for mild pain, fever or headache (or Fever >/= 101). 08/14/18  Yes Roxan Hockey, MD  benzonatate (TESSALON) 200  MG capsule Take 200 mg by mouth 3 (three) times daily as needed for cough. 08/05/18  Yes [provider]  calcium carbonate (TUMS - DOSED IN MG ELEMENTAL CALCIUM) 500 MG chewable tablet Chew 1 tablet (200 mg of elemental calcium total) by mouth 2 (two) times daily. 08/14/18  Yes Roxan Hockey, MD  collagenase (SANTYL) ointment Apply topically daily. Apply Santyl to right 2nd toe wound Q day, then cover with moist 2X2 and foam dressing.  (Change foam dressing Q 3 days or PRN soiling.) Patient taking differently: Apply 1  application topically as directed. Apply Santyl to right 2nd toe on right foot wound 3xweek, then cover with moist 2X2 and foam dressing.  (Change foam dressing Q 3 days or PRN soiling.) 08/15/18  Yes Emokpae, Courage, MD  feeding supplement, ENSURE ENLIVE, (ENSURE ENLIVE) LIQD Take 237 mLs by mouth 2 (two) times daily between meals. 02/26/18  Yes Lavina Hamman, MD  folic acid (FOLVITE) 1 MG tablet Take 1 mg by mouth daily.  07/16/18  Yes [provider]  leflunomide (ARAVA) 20 MG tablet Take 20 mg by mouth daily. 08/14/17  Yes [provider]  levothyroxine (SYNTHROID) 100 MCG tablet Take 1 tablet (100 mcg total) by mouth daily. 12/07/17 12/07/18 Yes Regalado, Belkys A, MD  magnesium oxide (MAG-OX) 400 (241.3 Mg) MG tablet Take 0.5 tablets (200 mg total) by mouth 2 (two) times daily. 12/07/17  Yes Regalado, Belkys A, MD  Melatonin 2.5 MG CAPS Take 1 capsule (2.5 mg total) by mouth at bedtime as needed (for sleep). Patient taking differently: Take 2.5 mg by mouth at bedtime.  12/07/17  Yes Regalado, Belkys A, MD  metFORMIN (GLUCOPHAGE-XR) 500 MG 24 hr tablet Take 500-1,000 mg by mouth See admin instructions. Take 1000 mg by mouth in the morning and 500 mg by mouth in the evening 03/07/15  Yes [provider]  mirtazapine (REMERON) 7.5 MG tablet Take 7.5 mg by mouth at bedtime. 08/05/18  Yes [provider]  Multiple Vitamins-Minerals (CENTRUM SILVER 50+WOMEN) TABS Take 1 tablet by mouth daily.   Yes [provider]  MYRBETRIQ 25 MG TB24 tablet Take 25 mg by mouth daily. 06/25/18  Yes [provider]  ondansetron (ZOFRAN) 4 MG tablet Take 1 tablet (4 mg total) by mouth every 6 (six) hours as needed for nausea. 08/14/18  Yes Emokpae, Courage, MD  pantoprazole (PROTONIX) 40 MG tablet Take 1 tablet (40 mg total) by mouth 2 (two) times daily. 08/14/18  Yes Roxan Hockey, MD  Pitavastatin Calcium (LIVALO) 2 MG TABS Take 2 mg by mouth every Monday, Wednesday,  and Friday.   Yes [provider]  predniSONE (DELTASONE) 1 MG tablet Take 4 mg by mouth daily with breakfast.    Yes [provider]  TRADJENTA 5 MG TABS tablet Take 5 mg by mouth daily. 01/29/18  Yes [provider]  VOLTAREN 1 % GEL Apply 4 g topically 4 (four) times daily. Apply to Hands and knees 11/30/13  Yes [provider]    Physical Exam: Vitals:   11/11/18 1630 11/11/18 1645 11/11/18 1700 11/11/18 1808  BP: 95/62  (!) 141/72 121/62  Pulse: (!) 40 82 80 79  Resp: 18 20 (!) 22 18  Temp:    98.9 F (37.2 C)  TempSrc:    Oral  SpO2: 98% 97% 93% 93%  Weight:    40.6 kg  Height:    5\' 6"  (1.676 m)    Constitutional: NAD, calm, comfortable  Vitals:   11/11/18 1630 11/11/18 1645 11/11/18 1700 11/11/18 1808  BP: 95/62  (!) 141/72 121/62  Pulse: (!) 40 82 80 79  Resp: 18 20 (!) 22 18  Temp:    98.9 F (37.2 C)  TempSrc:    Oral  SpO2: 98% 97% 93% 93%  Weight:    40.6 kg  Height:    5\' 6"  (1.676 m)   Eyes: PERRL, lids and conjunctivae normal ENMT: Mucous membranes are moist. Posterior pharynx clear of any exudate or lesions.Normal dentition.  Neck: normal, supple, no masses, no thyromegaly Respiratory: clear to auscultation bilaterally, no wheezing, no crackles.   Cardiovascular: irregularly irregular  Abdomen: no tenderness, no masses palpated Musculoskeletal: no clubbing / cyanosis. No joint deformity upper and lower extremities. Good ROM, no contractures. Normal muscle tone.  Skin: RLE dressed Neurologic: CN 2-12 grossly intact. Sensation intact.  Confused, but a&Ox3.  Psychiatric: Normal judgment and insight. Alert and oriented x 3. Normal mood.   Labs on Admission: I have personally reviewed following labs and imaging studies  CBC: Recent Labs  Lab 11/11/18 1224  WBC 8.6  NEUTROABS 6.6  HGB 9.2*  HCT 30.3*  MCV 91.0  PLT 366   Basic Metabolic Panel: Recent Labs  Lab 11/11/18 1224  NA 136  K 3.5  CL 99  CO2 23    GLUCOSE 126*  BUN 12  CREATININE 0.66  CALCIUM 7.7*   GFR: Estimated Creatinine Clearance: 31.2 mL/min (by C-G formula based on SCr of 0.66 mg/dL). Liver Function Tests: Recent Labs  Lab 11/11/18 1224  AST 18  ALT 10  ALKPHOS 82  BILITOT 0.9  PROT 6.5  ALBUMIN 2.8*   No results for input(s): LIPASE, AMYLASE in the last 168 hours. No results for input(s): AMMONIA in the last 168 hours. Coagulation Profile: No results for input(s): INR, PROTIME in the last 168 hours. Cardiac Enzymes: No results for input(s): CKTOTAL, CKMB, CKMBINDEX, TROPONINI in the last 168 hours. BNP (last 3 results) No results for input(s): PROBNP in the last 8760 hours. HbA1C: No results for input(s): HGBA1C in the last 72 hours. CBG: No results for input(s): GLUCAP in the last 168 hours. Lipid Profile: No results for input(s): CHOL, HDL, LDLCALC, TRIG, CHOLHDL, LDLDIRECT in the last 72 hours. Thyroid Function Tests: No results for input(s): TSH, T4TOTAL, FREET4, T3FREE, THYROIDAB in the last 72 hours. Anemia Panel: No results for input(s): VITAMINB12, FOLATE, FERRITIN, TIBC, IRON, RETICCTPCT in the last 72 hours. Urine analysis:    Component Value Date/Time   COLORURINE YELLOW 11/11/2018 1542   APPEARANCEUR HAZY (A) 11/11/2018 1542   LABSPEC 1.013 11/11/2018 1542   PHURINE 5.0 11/11/2018 1542   GLUCOSEU NEGATIVE 11/11/2018 1542   HGBUR SMALL (A) 11/11/2018 1542   HGBUR negative 03/31/2009 1047   BILIRUBINUR NEGATIVE 11/11/2018 1542   KETONESUR NEGATIVE 11/11/2018 1542   PROTEINUR NEGATIVE 11/11/2018 1542   UROBILINOGEN 0.2 08/28/2012 0543   NITRITE NEGATIVE 11/11/2018 1542   LEUKOCYTESUR LARGE (A) 11/11/2018 1542    Radiological Exams on Admission: Dg Chest 2 View  Result Date: 11/11/2018 CLINICAL DATA:  Fever and confusion EXAM: CHEST - 2 VIEW COMPARISON:  09/12/2018 FINDINGS: Heart size mildly enlarged. Negative for heart failure. Atherosclerotic aortic arch. Lungs are clear without  infiltrate or effusion. IMPRESSION: No active cardiopulmonary disease. Electronically Signed   By: Franchot Gallo M.D.   On: 11/11/2018 13:17   Ct Head Wo Contrast  Result Date: 11/11/2018 CLINICAL DATA:  85 year old female with altered  level consciousness since last night. Loss of appetite. Initial encounter. EXAM: CT HEAD WITHOUT CONTRAST TECHNIQUE: Contiguous axial images were obtained from the base of the skull through the vertex without intravenous contrast. COMPARISON:  09/12/2018. FINDINGS: Brain: No intracranial hemorrhage or CT evidence of large acute infarct. Prominent chronic microvascular changes. Global atrophy. No intracranial mass lesion noted on this unenhanced exam. Vascular: Prominent vascular calcifications. No acute hyperdense vessel. Skull: No acute abnormality. Sinuses/Orbits: Post lens replacement. No acute orbital abnormality. Visualized paranasal sinuses clear. Other: Mastoid air cells and middle ear cavities clear. IMPRESSION: 1. No intracranial hemorrhage or CT evidence of large acute infarct. 2. Prominent chronic microvascular changes. 3. Global atrophy. Electronically Signed   By: Genia Del M.D.   On: 11/11/2018 13:33    EKG: Independently reviewed. afib appears similar to prior  Assessment/Plan Active Problems:   Acute encephalopathy  Acute Metabolic Encephalopathy  Dementia: suspect 2/2 UTI. Continue to monitor with treatment of UTI Follow urine cx, blood cx Delirium precautions Head CT without acute findings  Pyuria  Likely Urinary Tract Infection:  Elevated temperature.  UA concerning for UTI with many bacteria, WBC's.  Yeast as well Ceftriaxone Fluconazole as well Follow culture  Lower Extremity Wound: RLE wound, currently with dressing intact.  Wound c/s.   Anemia: lower than in December.  No si/sx bleeding.  Follow anemia labs in AM.  Rheumatoid Arthritis: continue arava, prednisone, folic acid  Hypotension: continue synthroid  GERD: continue  PPI BID  T2DM: continue SSI, hold tradjenta  HLD: holding statin  Atrial Fibrillation: not on anticoagulation due to falls  GOC: unable to reach family to discuss code status.  Continue to discuss.  Currently full code.  Follow up completed med rec.  Care giver didn't have medication list.    DVT prophylaxis: lovenox  Code Status: full Family Communication: unable to reach family, care giver at bedside  Disposition Plan: pending  Consults called: none  Admission status: observation    Fayrene Helper MD [facility] Pager [email or web address]  If 7PM-7AM, please contact night-coverage [email or web address] Password [email or web address]  11/11/2018, 7:10 PM

## 2018-11-11 NOTE — ED Notes (Signed)
Bed: WA08 Expected date:  Expected time:  Means of arrival:  Comments: 85yo AMS, fever, UTI

## 2018-11-11 NOTE — ED Notes (Signed)
Transport called to take pt upstairs 

## 2018-11-11 NOTE — ED Notes (Signed)
ED TO INPATIENT HANDOFF REPORT  Name/Age/Gender [name] 85 y.o. female  Code Status Code Status History    Date Active Date Inactive Code Status Order ID Comments User Context   08/08/2018 1933 08/14/2018 2110 Full Code [id number]  Guilford Shi, MD Inpatient   08/08/2018 1756 08/08/2018 1933 DNR [id number]  Gu[place]ford Shi, MD ED   06/03/2018 1546 06/04/2018 2208 DNR [id number]  Karmen Bongo, MD ED   04/25/2018 2015 04/29/2018 2036 Full Code [id number]  Sid Falcon, MD Inpatient   02/25/2018 2023 02/26/2018 2116 Full Code [id number]  Jani Gravel, MD ED   12/05/2017 1344 12/07/2017 1547 Full Code [id number]  Reyne Dumas, MD ED   12/05/2017 1318 12/05/2017 1344 Full Code [id number]  Reyne Dumas, MD ED   11/29/2017 1137 11/30/2017 1859 Partial Code [id number]  Radene Gunning, NP ED   11/29/2017 1111 11/29/2017 1136 Full Code [id number]  Radene Gunning, NP ED   09/10/2017 2124 09/13/2017 1636 Full Code [id number]  Domenic Polite, MD ED   10/19/2012 2251 10/22/2012 1759 Full Code [id number]  Etta Quill., DO ED   08/30/2012 1032 09/01/2012 1833 Full Code [id number]  Delrae Sawyers, RN Inpatient   08/28/2012 0706 08/30/2012 1032 Full Code [id number]  Herb Grays, RN Inpatient      Home/SNF/Other Home  Chief Complaint altered   Level of Care/Admitting Diagnosis ED Disposition    ED Disposition Condition [facility]: [facility] [100102]  Level of Care: Med-Surg [16]  Diagnosis: Acute encephalopathy [423536]  Admitting Physician: Elodia Florence [phone]  Attending Physician: Cephus Slater, A CALDWELL [phone]  PT Class (Do Not Modify): Observation [104]  PT Acc Code (Do Not Modify): Observation [10022]       Medical History Past Medical History:  Diagnosis Date  . Acute bronchitis   . Anemia, unspecified   . Atrial fibrillation ([place])    not on AC due to falls  . Depressive disorder, not elsewhere classified   .  Diverticulosis of colon (without mention of hemorrhage)   . Esophageal reflux   . Family history of malignant neoplasm of gastrointestinal tract   . Functional diarrhea   . Hiatal hernia   . History of blood transfusion    "once; related to diverticulitis" (11/29/2017)  . Other and unspecified hyperlipidemia   . Other chest pain   . Other specified cardiac dysrhythmias(427.89)   . Personal history of colonic polyps 07/17/1995   adenomatous polyps  . PONV (postoperative nausea and vomiting)   . Recurrent UTI (urinary tract infection)    "2-3 times in the last 1 1/58yr" (11/29/2017)  . Rheumatoid arthritis ([place])   . Tachycardia, unspecified   . Type II diabetes mellitus ([place])   . Unspecified adverse effect of unspecified drug, medicinal and biological substance   . Unspecified essential hypertension   . Unspecified hypothyroidism     Allergies Allergies  Allergen Reactions  . Metformin And Related Diarrhea    IV Location/Drains/Wounds Patient Lines/Drains/Airways Status   Active Line/Drains/Airways    Name:   Placement date:   Placement time:   Site:   Days:   Peripheral IV 09/12/18 Left Antecubital   09/12/18    1018    Antecubital   60   Peripheral IV 11/11/18 Right Forearm   11/11/18    1420    Forearm   less than 1   Tunneled CVC Triple Lumen (Radiology)   -    -  External Urinary Catheter   11/11/18    1238    -   less than 1   Pressure Injury 11/29/17 Stage II -  Partial thickness loss of dermis presenting as a shallow open ulcer with a red, pink wound bed without slough. small blisterlike with no fluid    11/29/17    1219     347   Pressure Injury 11/29/17 Stage I -  Intact skin with non-blanchable redness of a localized area usually over a bony prominence.   11/29/17    1219     347   Pressure Injury 02/25/18 Stage I -  Intact skin with non-blanchable redness of a localized area usually over a bony prominence.   02/25/18    2133     259   Pressure Injury 02/25/18 Stage  IV - Full thickness tissue loss with exposed bone, tendon or muscle. full thickness wound: this is NOT a pressure injury; unusual location and appearance   02/25/18    2133     259   Wound / Incision (Open or Dehisced) 11/29/17 Diabetic ulcer Leg Left   11/29/17    1219    Leg   347   Wound / Incision (Open or Dehisced) 02/25/18 Non-pressure wound Foot Left   02/25/18    2133    Foot   259   Wound / Incision (Open or Dehisced) 08/10/18 Toe (Comment  which one) Right   08/10/18    1157    Toe (Comment  which one)   93          Labs/Imaging Results for orders placed or performed during the hospital encounter of 11/11/18 (from the past 48 hour(s))  Comprehensive metabolic panel     Status: Abnormal   Collection Time: 11/11/18 12:24 PM  Result Value Ref Range   Sodium 136 135 - 145 mmol/L   Potassium 3.5 3.5 - 5.1 mmol/L   Chloride 99 98 - 111 mmol/L   CO2 23 22 - 32 mmol/L   Glucose, Bld 126 (H) 70 - 99 mg/dL   BUN 12 8 - 23 mg/dL   Creatinine, Ser 0.66 0.44 - 1.00 mg/dL   Calcium 7.7 (L) 8.9 - 10.3 mg/dL   Total Protein 6.5 6.5 - 8.1 g/dL   Albumin 2.8 (L) 3.5 - 5.0 g/dL   AST 18 15 - 41 U/L   ALT 10 0 - 44 U/L   Alkaline Phosphatase 82 38 - 126 U/L   Total Bilirubin 0.9 0.3 - 1.2 mg/dL   GFR calc non Af Amer >60 >60 mL/min   GFR calc Af Amer >60 >60 mL/min   Anion gap 14 5 - 15    Comment: Performed at [facility], [place] [address] [place], [place] [address]  CBC WITH DIFFERENTIAL     Status: Abnormal   Collection Time: 11/11/18 12:24 PM  Result Value Ref Range   WBC 8.6 4.0 - 10.5 K/uL   RBC 3.33 (L) 3.87 - 5.11 MIL/uL   Hemoglobin 9.2 (L) 12.0 - 15.0 g/dL   HCT 30.3 (L) 36.0 - 46.0 %   MCV 91.0 80.0 - 100.0 fL   MCH 27.6 26.0 - 34.0 pg   MCHC 30.4 30.0 - 36.0 g/dL   RDW 17.4 (H) 11.5 - 15.5 %   Platelets 263 150 - 400 K/uL   nRBC 0.0 0.0 - 0.2 %   Neutrophils Relative % 77 %   Neutro Abs 6.6 1.7 - 7.7  K/uL   Lymphocytes Relative 11 %   Lymphs Abs  0.9 0.7 - 4.0 K/uL   Monocytes Relative 8 %   Monocytes Absolute 0.7 0.1 - 1.0 K/uL   Eosinophils Relative 2 %   Eosinophils Absolute 0.2 0.0 - 0.5 K/uL   Basophils Relative 1 %   Basophils Absolute 0.1 0.0 - 0.1 K/uL   Immature Granulocytes 1 %   Abs Immature Granulocytes 0.07 0.00 - 0.07 K/uL    Comment: Performed at [facility], [place] [address] [place], [place] [address]  Lactic acid, plasma     Status: None   Collection Time: 11/11/18 12:55 PM  Result Value Ref Range   Lactic Acid, Venous 1.6 0.5 - 1.9 mmol/L    Comment: Performed at [facility], [place] [address], [place], [place] [address]  POC occult blood, ED Provider will collect     Status: None   Collection Time: 11/11/18  2:48 PM  Result Value Ref Range   Fecal Occult Bld NEGATIVE NEGATIVE  Urinalysis, Routine w reflex microscopic     Status: Abnormal   Collection Time: 11/11/18  3:42 PM  Result Value Ref Range   Color, Urine YELLOW YELLOW   APPearance HAZY (A) CLEAR   Specific Gravity, Urine 1.013 1.005 - 1.030   pH 5.0 5.0 - 8.0   Glucose, UA NEGATIVE NEGATIVE mg/dL   Hgb urine dipstick SMALL (A) NEGATIVE   Bilirubin Urine NEGATIVE NEGATIVE   Ketones, ur NEGATIVE NEGATIVE mg/dL   Protein, ur NEGATIVE NEGATIVE mg/dL   Nitrite NEGATIVE NEGATIVE   Leukocytes,Ua LARGE (A) NEGATIVE   RBC / HPF 21-50 0 - 5 RBC/hpf   WBC, UA >50 (H) 0 - 5 WBC/hpf   Bacteria, UA MANY (A) NONE SEEN   Squamous Epithelial / LPF 0-5 0 - 5   Budding Yeast PRESENT    Non Squamous Epithelial 0-5 (A) NONE SEEN    Comment: Performed at [facility], [place] [address], [place], [place] [address]  Influenza panel by PCR (type A & B)     Status: None   Collection Time: 11/11/18  3:42 PM  Result Value Ref Range   Influenza A By PCR NEGATIVE NEGATIVE   Influenza B By PCR NEGATIVE NEGATIVE    Comment: (NOTE) The Xpert Xpress Flu assay is intended as an aid in the diagnosis of  influenza and  should not be used as a sole basis for treatment.  This  assay is FDA approved for nasopharyngeal swab specimens only. Nasal  washings and aspirates are unacceptable for Xpert Xpress Flu testing. Performed at [facility], [place] [address], [place], [place] [address]    Dg Chest 2 View  Result Date: 11/11/2018 CLINICAL DATA:  Fever and confusion EXAM: CHEST - 2 VIEW COMPARISON:  09/12/2018 FINDINGS: Heart size mildly enlarged. Negative for heart failure. Atherosclerotic aortic arch. Lungs are clear without infiltrate or effusion. IMPRESSION: No active cardiopulmonary disease. Electronically Signed   By: Franchot Gallo M.D.   On: 11/11/2018 13:17   Ct Head Wo Contrast  Result Date: 11/11/2018 CLINICAL DATA:  85 year old female with altered level consciousness since last night. Loss of appetite. Initial encounter. EXAM: CT HEAD WITHOUT CONTRAST TECHNIQUE: Contiguous axial images were obtained from the base of the skull through the vertex without intravenous contrast. COMPARISON:  09/12/2018. FINDINGS: Brain: No intracranial hemorrhage or CT evidence of large acute infarct. Prominent chronic microvascular changes. Global atrophy. No intracranial mass lesion noted  on this unenhanced exam. Vascular: Prominent vascular calcifications. No acute hyperdense vessel. Skull: No acute abnormality. Sinuses/Orbits: Post lens replacement. No acute orbital abnormality. Visualized paranasal sinuses clear. Other: Mastoid air cells and middle ear cavities clear. IMPRESSION: 1. No intracranial hemorrhage or CT evidence of large acute infarct. 2. Prominent chronic microvascular changes. 3. Global atrophy. Electronically Signed   By: Genia Del M.D.   On: 11/11/2018 13:33    Pending Labs Unresulted Labs (From admission, onward)    Start     Ordered   11/11/18 1224  Lactic acid, plasma  Now then every 2 hours,   STAT     11/11/18 1224   11/11/18 1224  Blood Culture (routine x 2)  BLOOD CULTURE X 2,    STAT     11/11/18 1224   11/11/18 1224  Urine culture  ONCE - STAT,   STAT     11/11/18 1224   Signed and Held  CBC  (enoxaparin (LOVENOX)    CrCl < 30 ml/min)  Once,   R    Comments:  Baseline for enoxaparin therapy IF NOT ALREADY DRAWN.  Notify MD if PLT < 100 K.    Signed and Held   Signed and Held  Creatinine, serum  (enoxaparin (LOVENOX)    CrCl < 30 ml/min)  Once,   R    Comments:  Baseline for enoxaparin therapy IF NOT ALREADY DRAWN.    Signed and Held   Signed and Held  Creatinine, serum  (enoxaparin (LOVENOX)    CrCl < 30 ml/min)  Weekly,   R    Comments:  while on enoxaparin therapy.    Signed and Held   Signed and Held  Comprehensive metabolic panel  Tomorrow morning,   R     Signed and Held   Signed and Held  CBC  Tomorrow morning,   R     Signed and Held          Vitals/Pain Today's Vitals   11/11/18 1205 11/11/18 1207 11/11/18 1334 11/11/18 1550  BP: 140/66  134/79   Pulse: 69  79   Resp: (!) 22  20   Temp: 100.1 F (37.8 C)   100.3 F (37.9 C)  TempSrc: Oral   Rectal  SpO2: 96%  97%   Weight:  40.8 kg    Height:  5\' 6"  (1.676 m)      Isolation Precautions No active isolations  Medications Medications  0.9 %  sodium chloride infusion (1,000 mLs Intravenous New Bag/Given 11/11/18 1549)  cefTRIAXone (ROCEPHIN) 1 g in sodium chloride 0.9 % 100 mL IVPB (has no administration in time range)  cefTRIAXone (ROCEPHIN) 1 g in sodium chloride 0.9 % 100 mL IVPB (has no administration in time range)    Mobility non-ambulatory

## 2018-11-11 NOTE — ED Notes (Signed)
Pure wick has been placed. Pt has been educated as well as family members. Suction set to 85mmHg

## 2018-11-12 ENCOUNTER — Other Ambulatory Visit: Payer: Self-pay

## 2018-11-12 DIAGNOSIS — E1169 Type 2 diabetes mellitus with other specified complication: Secondary | ICD-10-CM | POA: Diagnosis not present

## 2018-11-12 DIAGNOSIS — K5730 Diverticulosis of large intestine without perforation or abscess without bleeding: Secondary | ICD-10-CM | POA: Diagnosis present

## 2018-11-12 DIAGNOSIS — F0390 Unspecified dementia without behavioral disturbance: Secondary | ICD-10-CM

## 2018-11-12 DIAGNOSIS — E43 Unspecified severe protein-calorie malnutrition: Secondary | ICD-10-CM | POA: Diagnosis not present

## 2018-11-12 DIAGNOSIS — K449 Diaphragmatic hernia without obstruction or gangrene: Secondary | ICD-10-CM | POA: Diagnosis present

## 2018-11-12 DIAGNOSIS — D649 Anemia, unspecified: Secondary | ICD-10-CM | POA: Diagnosis not present

## 2018-11-12 DIAGNOSIS — M05741 Rheumatoid arthritis with rheumatoid factor of right hand without organ or systems involvement: Secondary | ICD-10-CM

## 2018-11-12 DIAGNOSIS — I10 Essential (primary) hypertension: Secondary | ICD-10-CM | POA: Diagnosis present

## 2018-11-12 DIAGNOSIS — B3749 Other urogenital candidiasis: Secondary | ICD-10-CM | POA: Diagnosis not present

## 2018-11-12 DIAGNOSIS — I4891 Unspecified atrial fibrillation: Secondary | ICD-10-CM | POA: Diagnosis present

## 2018-11-12 DIAGNOSIS — G9341 Metabolic encephalopathy: Secondary | ICD-10-CM | POA: Diagnosis present

## 2018-11-12 DIAGNOSIS — G9340 Encephalopathy, unspecified: Secondary | ICD-10-CM

## 2018-11-12 DIAGNOSIS — Z7952 Long term (current) use of systemic steroids: Secondary | ICD-10-CM | POA: Diagnosis not present

## 2018-11-12 DIAGNOSIS — M069 Rheumatoid arthritis, unspecified: Secondary | ICD-10-CM | POA: Diagnosis present

## 2018-11-12 DIAGNOSIS — E785 Hyperlipidemia, unspecified: Secondary | ICD-10-CM | POA: Diagnosis present

## 2018-11-12 DIAGNOSIS — M05742 Rheumatoid arthritis with rheumatoid factor of left hand without organ or systems involvement: Secondary | ICD-10-CM | POA: Diagnosis not present

## 2018-11-12 DIAGNOSIS — R64 Cachexia: Secondary | ICD-10-CM | POA: Diagnosis present

## 2018-11-12 DIAGNOSIS — Z800 Family history of malignant neoplasm of digestive organs: Secondary | ICD-10-CM | POA: Diagnosis not present

## 2018-11-12 DIAGNOSIS — B952 Enterococcus as the cause of diseases classified elsewhere: Secondary | ICD-10-CM | POA: Diagnosis present

## 2018-11-12 DIAGNOSIS — Z681 Body mass index (BMI) 19 or less, adult: Secondary | ICD-10-CM | POA: Diagnosis not present

## 2018-11-12 DIAGNOSIS — E039 Hypothyroidism, unspecified: Secondary | ICD-10-CM | POA: Diagnosis present

## 2018-11-12 DIAGNOSIS — K219 Gastro-esophageal reflux disease without esophagitis: Secondary | ICD-10-CM | POA: Diagnosis present

## 2018-11-12 DIAGNOSIS — B9620 Unspecified Escherichia coli [E. coli] as the cause of diseases classified elsewhere: Secondary | ICD-10-CM | POA: Diagnosis present

## 2018-11-12 DIAGNOSIS — Z79890 Hormone replacement therapy (postmenopausal): Secondary | ICD-10-CM | POA: Diagnosis not present

## 2018-11-12 DIAGNOSIS — Z993 Dependence on wheelchair: Secondary | ICD-10-CM | POA: Diagnosis not present

## 2018-11-12 DIAGNOSIS — Z8719 Personal history of other diseases of the digestive system: Secondary | ICD-10-CM

## 2018-11-12 DIAGNOSIS — Z7984 Long term (current) use of oral hypoglycemic drugs: Secondary | ICD-10-CM | POA: Diagnosis not present

## 2018-11-12 DIAGNOSIS — N390 Urinary tract infection, site not specified: Secondary | ICD-10-CM | POA: Diagnosis present

## 2018-11-12 DIAGNOSIS — Z888 Allergy status to other drugs, medicaments and biological substances status: Secondary | ICD-10-CM | POA: Diagnosis not present

## 2018-11-12 LAB — COMPREHENSIVE METABOLIC PANEL
ALT: 7 U/L (ref 0–44)
AST: 15 U/L (ref 15–41)
Albumin: 2.4 g/dL — ABNORMAL LOW (ref 3.5–5.0)
Alkaline Phosphatase: 67 U/L (ref 38–126)
Anion gap: 9 (ref 5–15)
BUN: 12 mg/dL (ref 8–23)
CO2: 23 mmol/L (ref 22–32)
Calcium: 7.2 mg/dL — ABNORMAL LOW (ref 8.9–10.3)
Chloride: 105 mmol/L (ref 98–111)
Creatinine, Ser: 0.64 mg/dL (ref 0.44–1.00)
GFR calc non Af Amer: >60 mL/min (ref >60)
Glucose, Bld: 95 mg/dL (ref 70–99)
Potassium: 3.4 mmol/L — ABNORMAL LOW (ref 3.5–5.1)
Sodium: 137 mmol/L (ref 135–145)
Total Bilirubin: 0.4 mg/dL (ref 0.3–1.2)
Total Protein: 5.7 g/dL — ABNORMAL LOW (ref 6.5–8.1)

## 2018-11-12 LAB — CBC
HCT: 29 % — ABNORMAL LOW (ref 36.0–46.0)
Hemoglobin: 8.6 g/dL — ABNORMAL LOW (ref 12.0–15.0)
MCH: 26.9 pg (ref 26.0–34.0)
MCHC: 29.7 g/dL — ABNORMAL LOW (ref 30.0–36.0)
MCV: 90.6 fL (ref 80.0–100.0)
Platelets: 253 10*3/uL (ref 150–400)
RBC: 3.2 MIL/uL — ABNORMAL LOW (ref 3.87–5.11)
RDW: 17.5 % — AB (ref 11.5–15.5)
WBC: 5.3 10*3/uL (ref 4.0–10.5)
nRBC: 0 % (ref 0.0–0.2)

## 2018-11-12 LAB — GLUCOSE, CAPILLARY
Glucose-Capillary: 86 mg/dL (ref 70–99)
Glucose-Capillary: 156 mg/dL — ABNORMAL HIGH (ref 70–99)
Glucose-Capillary: 186 mg/dL — ABNORMAL HIGH (ref 70–99)
Glucose-Capillary: 228 mg/dL — ABNORMAL HIGH (ref 70–99)

## 2018-11-12 LAB — IRON AND TIBC
Iron: 14 ug/dL — ABNORMAL LOW (ref 28–170)
Saturation Ratios: 8 % — ABNORMAL LOW (ref 10.4–31.8)
TIBC: 181 ug/dL — ABNORMAL LOW (ref 250–450)
UIBC: 167 ug/dL

## 2018-11-12 LAB — FOLATE: Folate: 56.5 ng/mL (ref >5.9)

## 2018-11-12 LAB — FERRITIN: Ferritin: 201 ng/mL (ref 11–307)

## 2018-11-12 LAB — VITAMIN B12: Vitamin B-12: 215 pg/mL (ref 180–914)

## 2018-11-12 LAB — MRSA PCR SCREENING: MRSA by PCR: NEGATIVE

## 2018-11-12 MED ORDER — SODIUM CHLORIDE 0.9 % IV SOLN
1.0000 g | INTRAVENOUS | Status: DC
  Administered 2018-11-12: 1 g via INTRAVENOUS
  Filled 2018-11-12 (×2): qty 10

## 2018-11-12 MED ORDER — POTASSIUM CHLORIDE CRYS ER 20 MEQ PO TBCR
40.0000 meq | EXTENDED_RELEASE_TABLET | Freq: Once | ORAL | Status: AC
  Administered 2018-11-12: 40 meq via ORAL
  Filled 2018-11-12: qty 2

## 2018-11-12 MED ORDER — DICLOFENAC SODIUM 1 % TD GEL
2.0000 g | Freq: Four times a day (QID) | TRANSDERMAL | Status: DC | PRN
  Filled 2018-11-12: qty 100

## 2018-11-12 MED ORDER — ENSURE ENLIVE PO LIQD
237.0000 mL | Freq: Two times a day (BID) | ORAL | Status: DC
  Administered 2018-11-12 – 2018-11-13 (×3): 237 mL via ORAL

## 2018-11-12 MED ORDER — MAGNESIUM OXIDE 400 (241.3 MG) MG PO TABS
400.0000 mg | ORAL_TABLET | Freq: Every day | ORAL | Status: DC
  Administered 2018-11-12 – 2018-11-13 (×2): 400 mg via ORAL
  Filled 2018-11-12 (×2): qty 1

## 2018-11-12 NOTE — Evaluation (Addendum)
Physical Therapy Evaluation Patient Details Name: JACQUES WILLINGHAM MRN: [id number] DOB: 04-25-1929 Today's Date: 11/12/2018   History of Present Illness  85 y.o. female admittedo to ED with acute encephalopathy, UTI, RLE wound. Head CT - for acute findings. PMH includes dementia, afib, GIB, DM, hypothyroidism, GERD, diverticulosis, HTN, RA, anemia, depression, UTIs, TIA, syncope, R hip fracture, ORIF distal femur 2013.   Clinical Impression   Pt presents with LE weakness, bilateral LE soreness R>L, total assist for all mobility, and limited activity tolerance. Per personal care aide, pt is total assist for all mobility and requires assist with all ADLs. Pt does not currently have 24/7 assist at home. PT recommending SNF level of care for increased supervision and medical care, not for rehabilitative services. PT signing off, given pt is at baseline. Please re-consult if needed.     Follow Up Recommendations SNF;Supervision/Assistance - 24 hour    Equipment Recommendations  None recommended by PT    Recommendations for Other Services       Precautions / Restrictions Precautions Precautions: Fall Restrictions Weight Bearing Restrictions: No      Mobility  Bed Mobility Overal bed mobility: Needs Assistance Bed Mobility: Sit to Supine     Supine to sit: Max assist Sit to supine: Total assist;HOB elevated   General bed mobility comments: Pt up in chair upon arrival to room. Pt total assist to move from sitting to supine, and required +2 assist to scoot up in bed.   Transfers Overall transfer level: Needs assistance Equipment used: None Transfers: Squat Pivot Transfers     Squat pivot transfers: Total assist     General transfer comment: Attempted to perform sit to stand with pt with total assist +2, but pt unable to perform hip and knee extension to come to standing. Per pt's aide, pt does not stand at home. PT performed squat pivot with total assist with drop arm  recliner to bring pt back to bed.   Ambulation/Gait Ambulation/Gait assistance: (NT- pt does not walk at baseline )              Financial trader Rankin (Stroke Patients Only)       Balance Overall balance assessment: Needs assistance Sitting-balance support: Feet supported;Bilateral upper extremity supported Sitting balance-Leahy Scale: Poor Sitting balance - Comments: Pt able to sit EOB without PT support, but relies on UEs and LEs to balance    Standing balance support: No upper extremity supported Standing balance-Leahy Scale: Zero Standing balance comment: PT total assist for transfer from bed to chair, unable to come to standing.                              Pertinent Vitals/Pain Pain Assessment: Faces Pain Score: 4  Faces Pain Scale: Hurts even more Pain Location: bilateral LEs, R>L Pain Descriptors / Indicators: Sore Pain Intervention(s): Limited activity within patient's tolerance;Repositioned;Monitored during session    Home Living Family/patient expects to be discharged to:: Private residence Living Arrangements: Alone Available Help at Discharge: Personal care attendant([name] primary aide is with her during the day (8-5), then another person comes in from 5pm-11pm to assist her. Pt is alone 11p-8a) Type of Home: House Home Access: Stairs to enter   [employer] of Steps: 2 Home Layout: One level;Able to live on main level with bedroom/bathroom Home Equipment: Walker - 4 wheels;Wheelchair -  manual;Shower seat;Bedside commode Additional Comments: has part time help only    Prior Function Level of Independence: Needs assistance   Gait / Transfers Assistance Needed: Pt is total assist at baseline to transfer to and from wheelchair. Per pt's aide at bedside, pt is at wheelchair level of mobility. Per aide, pt receives PT but does not work on standing. [name] goals at pt's home are supine LE exercises  and pt propelling self in wheelchair.   ADL's / Homemaking Assistance Needed: Pt requires assist with all ADLs, including dressing, bathing, preparing food, toileting.         Hand Dominance   Dominant Hand: Right    Extremity/Trunk Assessment   Upper Extremity Assessment Upper Extremity Assessment: Defer to OT evaluation RUE Deficits / Details: decreased shoulder flexion as well as hand use due to arthritis    Lower Extremity Assessment Lower Extremity Assessment: Generalized weakness    Cervical / Trunk Assessment Cervical / Trunk Assessment: Normal  Communication   Communication: HOH  Cognition Arousal/Alertness: Awake/alert Behavior During Therapy: WFL for tasks assessed/performed Overall Cognitive Status: Impaired/Different from baseline Area of Impairment: Memory;Awareness;Problem solving;Safety/judgement;Orientation                 Orientation Level: Disoriented to;Place       Safety/Judgement: Decreased awareness of safety;Decreased awareness of deficits   Problem Solving: Requires verbal cues;Difficulty sequencing;Requires tactile cues General Comments: Pt aware of self, birthday, and location, but states "I am at [employer] in [place]". Pt with difficulty telling me what she does on a daily basis at home. Per pt's aide, pt loves to shop daily at [facility] and [employer] but pt unable to recall [employer]. When PT was asking pt questions, pt states "you're getting me all confused"      General Comments General comments (skin integrity, edema, etc.): PT applied prevalon boot to RLE for positioning and protecting skin integrity     Exercises     Assessment/Plan    PT Assessment Patent does not need any further PT services  PT Problem List         PT Treatment Interventions DME instruction;Therapeutic activities;Gait training;Therapeutic exercise;Patient/family education;Balance training;Functional mobility training    PT Goals (Current goals can be found  in the Care Plan section)  Acute Rehab PT Goals Patient Stated Goal: home with caregivers PT Goal Formulation: With patient Time For Goal Achievement: 11/12/18 Potential to Achieve Goals: Fair    Frequency Min 2X/week   Barriers to discharge Decreased caregiver support;Other (comment)(lack of 24/7 care )      Co-evaluation               AM-PAC PT "6 Clicks" Mobility  Outcome Measure Help needed turning from your back to your side while in a flat bed without using bedrails?: Total Help needed moving from lying on your back to sitting on the side of a flat bed without using bedrails?: Total Help needed moving to and from a bed to a chair (including a wheelchair)?: Total Help needed standing up from a chair using your arms (e.g., wheelchair or bedside chair)?: Total Help needed to walk in hospital room?: Total Help needed climbing 3-5 steps with a railing? : Total 6 Click Score: 6    End of Session   Activity Tolerance: Patient limited by pain;Patient limited by fatigue Patient left: with call bell/phone within reach;with family/visitor present;with nursing/sitter in room;with bed alarm set;in bed Nurse Communication: Mobility status PT Visit Diagnosis: Muscle weakness (generalized) (M62.81);Other abnormalities  of gait and mobility (R26.89)    Time: 1247-1310 PT Time Calculation (min) (ACUTE ONLY): 23 min   Charges:   PT Evaluation $PT Eval Low Complexity: 1 Low PT Treatments $Therapeutic Activity: 8-22 mins        Karisha Marlin Conception Chancy, PT Acute Rehabilitation Services Pager [phone]  Office [phone]  Sukari Grist D Doren Kaspar 11/12/2018, 2:05 PM

## 2018-11-12 NOTE — Evaluation (Signed)
Occupational Therapy Evaluation Patient Details Name: [name] MRN: [id number] DOB: 04/24/1929 Today's Date: 11/12/2018    History of Present Illness  CHANELLE HODSDON is a 85 y.o. female with medical history significant of atrial fibrillation, type 2 diabetes, rheumatoid arthritis, and multiple other medical problems presenting with confusion.   Clinical Impression   Pt admitted with the above. Pt currently with functional limitations due to the deficits listed below (see OT Problem List).  Pt will benefit from skilled OT to increase their safety and independence with ADL and functional mobility for ADL to facilitate discharge to venue listed below.     Follow Up Recommendations  Comments: Home health OT;SNF;Supervision/Assistance - 24 hour   THIS Patient is NOT safe to be Alone 12 hours a day.  Nurse from pts home care agency in room and stated she was taking pts earrings and necklace home and locking them in a safe.  This home care nurse was frustrated by the ER throwing pts thousand dollar necklace in with her clothes.  She said there were protocols for jewelry .  OT inquired if necklace was lost and nurse stated no.  Reported to Surveyor, quantity   Equipment Recommendations  None recommended by OT    Recommendations for Other Services       Precautions / Restrictions Precautions Precautions: Fall      Mobility Bed Mobility Overal bed mobility: Needs Assistance Bed Mobility: Supine to Sit     Supine to sit: Max assist        Transfers Overall transfer level: Needs assistance Equipment used: None Transfers: Squat Pivot Transfers     Squat pivot transfers: Total assist;+2 physical assistance     General transfer comment: pt not able to weight bear.  Pts caregiver demonstrated picking pt up and putting her in chair.  Pt is TOTAL A.  For toileting safely pt would be 2 person A    Balance Overall balance assessment: Needs assistance Sitting-balance  support: Feet supported;Bilateral upper extremity supported Sitting balance-Leahy Scale: Poor     Standing balance support: Bilateral upper extremity supported Standing balance-Leahy Scale: Zero                             ADL either performed or assessed with clinical judgement   ADL Overall ADL's : Needs assistance/impaired Eating/Feeding: Maximal assistance;Sitting   Grooming: Maximal assistance;Sitting   Upper Body Bathing: Maximal assistance;Sitting   Lower Body Bathing: +2 for physical assistance;+2 for safety/equipment;Total assistance;Sit to/from stand;Cueing for sequencing;Cueing for safety;Sitting/lateral leans   Upper Body Dressing : Sitting;Maximal assistance       Toilet Transfer: +2 for physical assistance;+2 for safety/equipment;Cueing for safety;Cueing for sequencing;Squat-pivot;BSC   Toileting- Clothing Manipulation and Hygiene: +2 for physical assistance;+2 for safety/equipment;Total assistance;Sitting/lateral lean;Cueing for safety;Cueing for sequencing               Vision Patient Visual Report: No change from baseline              Pertinent Vitals/Pain Pain Assessment: Faces Pain Score: 4  Pain Location: general with movement Pain Descriptors / Indicators: Sore Pain Intervention(s): Limited activity within patient's tolerance;Repositioned;Monitored during session     Hand Dominance     Extremity/Trunk Assessment Upper Extremity Assessment Upper Extremity Assessment: LUE deficits/detail;RUE deficits/detail RUE Deficits / Details: decreased shoulder flexion as well as hand use due to arthritis           Communication Communication Communication:  HOH   Cognition Arousal/Alertness: Awake/alert Behavior During Therapy: WFL for tasks assessed/performed Overall Cognitive Status: Impaired/Different from baseline Area of Impairment: Memory;Awareness;Problem solving;Safety/judgement                          Safety/Judgement: Decreased awareness of safety;Decreased awareness of deficits   Problem Solving: Requires verbal cues;Difficulty sequencing;Requires tactile cues     General Comments               Home Living Family/patient expects to be discharged to:: Private residence Living Arrangements: Alone Available Help at Discharge: Personal care attendant(only 12 hours a day) Type of Home: House Home Access: Stairs to enter     Home Layout: One level;Able to live on main level with bedroom/bathroom     Bathroom Shower/Tub: Tub/shower unit   Bathroom Toilet: Standard Bathroom Accessibility: Yes   Home Equipment: Walker - 4 wheels;Wheelchair - manual;Shower seat;Bedside commode   Additional Comments: has part time help only               OT Problem List: Decreased strength;Decreased activity tolerance;Impaired balance (sitting and/or standing);Decreased safety awareness;Decreased knowledge of use of DME or AE;Decreased cognition;Decreased range of motion      OT Treatment/Interventions: Self-care/ADL training;Patient/family education;Therapeutic activities;DME and/or AE instruction    OT Goals(Current goals can be found in the care plan section) Acute Rehab OT Goals Patient Stated Goal: home with caregivers OT Goal Formulation: With patient Time For Goal Achievement: 11/19/18  OT Frequency: Min 2X/week   Barriers to D/C: Decreased caregiver support  pt only has 12 hours of A a day.  Pt needs 24/7 A       Co-evaluation              AM-PAC OT "6 Clicks" Daily Activity     Outcome Measure Help from another person eating meals?: A Lot Help from another person taking care of personal grooming?: A Lot Help from another person toileting, which includes using toliet, bedpan, or urinal?: Total Help from another person bathing (including washing, rinsing, drying)?: Total Help from another person to put on and taking off regular upper body clothing?: A Lot Help  from another person to put on and taking off regular lower body clothing?: Total 6 Click Score: 9   End of Session Nurse Communication: Mobility status  Activity Tolerance: Patient tolerated treatment well Patient left: in chair;with call bell/phone within reach;with family/visitor present  OT Visit Diagnosis: Other abnormalities of gait and mobility (R26.89);Repeated falls (R29.6);History of falling (Z91.81);Muscle weakness (generalized) (M62.81);Unsteadiness on feet (R26.81)                Time: 3220-2542 OT Time Calculation (min): 26 min Charges:  OT Evaluation $OT Eval Moderate Complexity: 1 Mod OT Treatments $Self Care/Home Management : 8-22 mins  Kari Baars, OT Acute Rehabilitation Services [phone] Office- [phone]     Onetha Gaffey, Edwena Felty D 11/12/2018, 1:35 PM

## 2018-11-12 NOTE — Care Management Note (Signed)
Case Management Note  Patient Details  Name: [name] MRN: [id number] Date of Birth: 09-May-1929   Per [facility] liaison, pt is active with HHRN/PT services. WIll need resumption orders at dc.   Expected Discharge Date:  (unknown)               Expected Discharge Plan:     In-House Referral:     Discharge planning Services     Post Acute Care Choice:    Choice offered to:     DME Arranged:    DME Agency:     HH Arranged:   RN PT HH Agency:   [facility]  Status of Service:     If discussed at [place] of Stay Meetings, dates discussed:    Additional CommentsLynnell Catalan, RN 11/12/2018, 11:16 AM [phone]

## 2018-11-12 NOTE — Progress Notes (Addendum)
PROGRESS NOTE    [name]  HAL:[id number] DOB: [birth year] DOA: 11/11/2018 PCP: Deland Pretty, MD    Brief Narrative:  Patient is a pleasant 85 year old female history of A. fib, type 2 diabetes,, rheumatoid arthritis, multiple other medical problems who presented to the ED with worsening confusion.  Patient admitted for acute encephalopathy felt secondary to a UTI.   Assessment & Plan:   Principal Problem:   Acute encephalopathy Active Problems:   Type 2 diabetes mellitus with other specified complication (HCC)   Essential hypertension   Rheumatoid arthritis involving both hands with positive rheumatoid factor (HCC)   History of gastroesophageal reflux (GERD)   Protein-calorie malnutrition, severe   Urinary tract infection without hematuria   Dementia without behavioral disturbance (HCC)  1 acute encephalopathy likely secondary to UTI in the setting of underlying dementia Patient presenting with worsening confusion.  Head CT done on admission was negative for any acute intracranial abnormality, prominent chronic microvascular changes, global atrophy.  Chest x-ray done was negative for any acute infiltrate.  Done with many bacteria, nitrite negative, greater than 50 WBCs.  She was ordered and are pending.  Urine cultures pending.  Patient slowly improving however not at baseline.  Patient was to receive IV Rocephin on admission however never given.  We will give first dose of IV Rocephin now.  IV fluids.  Supportive care.  2.  Pyuria/likely UTI Urinalysis done on admission was concerning for UTI with many bacteria WBCs as well as yeast.  Urine cultures pending.  Patient was to get a dose of IV Rocephin yesterday however never got the dose of IV Rocephin.  Start IV Rocephin now.  Continue fluconazole.  3.  Lower extremity wounds Patient with right lower extremity wound with dressing intact.  Wound nurse consulted.  4.  Rheumatoid arthritis Stable.  Continue Arava,  prednisone, folic acid.  Outpatient follow-up.  5.  Hypothyroidism Continue home dose Synthroid.  6.  Gastroesophageal reflux disease PPI twice daily.  7.  Diabetes mellitus type 2 Hemoglobin A1c was 6.4 on 04/26/2018.  CBG of 86 this morning.  Continue to hold Tradjenta and metformin.  Continue sliding scale insulin.  8.  Dementia Stable.  Outpatient follow-up.  9.  Atrial fibrillation Rate controlled.  Not on anticoagulation secondary to history of falls.  Outpatient follow-up.  10 protein calorie malnutrition Continue nutritional supplementation.    DVT prophylaxis: Lovenox Code Status: Full Family Communication: Updated caregiver at bedside.  Updated son Clovis Cao via telephone [phone]) Disposition Plan: Likely back home if continued improvement in the next 24 to 48 hours.   Consultants:   None  Procedures:   CT head 11/11/2018  Chest x-ray 11/11/2018    Antimicrobials:   IV Rocephin 11/12/2018     Subjective: Sitting up in chair.  Alert to self and place.  Thinks it is 1917.  Denies any chest pain no shortness of breath.  No abdominal pain.  Denied any dysuria this morning.  Confusion improving however not at baseline.  Objective: Vitals:   11/11/18 1808 11/11/18 2124 11/12/18 0637 11/12/18 1419  BP: 121/62 138/72 (!) 149/73 (!) 150/76  Pulse: 79 93 68 76  Resp: 18 17 17 16   Temp: 98.9 F (37.2 C) 98.4 F (36.9 C) 98.4 F (36.9 C) 99.6 F (37.6 C)  TempSrc: Oral Oral Oral Oral  SpO2: 93% 94% 94% 99%  Weight: 40.6 kg     Height: 5\' 6"  (1.676 m)  Intake/Output Summary (Last 24 hours) at 11/12/2018 1433 Last data filed at 11/12/2018 0930 Gross per 24 hour  Intake 1030.14 ml  Output -  Net 1030.14 ml   Filed Weights   11/11/18 1207 11/11/18 1808  Weight: 40.8 kg 40.6 kg    Examination:  General exam: Appears calm and comfortable  Respiratory system: Clear to auscultation. Respiratory effort normal. Cardiovascular system: S1 &  S2 heard, RRR. No JVD, murmurs, rubs, gallops or clicks. No pedal edema. Gastrointestinal system: Abdomen is nondistended, soft and nontender. No organomegaly or masses felt. Normal bowel sounds heard. Central nervous system: Alert and oriented to self and place.. No focal neurological deficits. Extremities: Symmetric 5 x 5 power.  Bilateral hands with chronic changes of rheumatoid arthritis. Skin: No rashes, lesions or ulcers Psychiatry: Judgement and insight appear normal. Mood & affect appropriate.     Data Reviewed: I have personally reviewed following labs and imaging studies  CBC: Recent Labs  Lab 11/11/18 1224 11/12/18 0341  WBC 8.6 5.3  NEUTROABS 6.6  --   HGB 9.2* 8.6*  HCT 30.3* 29.0*  MCV 91.0 90.6  PLT 263 854   Basic Metabolic Panel: Recent Labs  Lab 11/11/18 1224 11/12/18 0341  NA 136 137  K 3.5 3.4*  CL 99 105  CO2 23 23  GLUCOSE 126* 95  BUN 12 12  CREATININE 0.66 0.64  CALCIUM 7.7* 7.2*   GFR: Estimated Creatinine Clearance: 31.2 mL/min (by C-G formula based on SCr of 0.64 mg/dL). Liver Function Tests: Recent Labs  Lab 11/11/18 1224 11/12/18 0341  AST 18 15  ALT 10 7  ALKPHOS 82 67  BILITOT 0.9 0.4  PROT 6.5 5.7*  ALBUMIN 2.8* 2.4*   No results for input(s): LIPASE, AMYLASE in the last 168 hours. No results for input(s): AMMONIA in the last 168 hours. Coagulation Profile: No results for input(s): INR, PROTIME in the last 168 hours. Cardiac Enzymes: No results for input(s): CKTOTAL, CKMB, CKMBINDEX, TROPONINI in the last 168 hours. BNP (last 3 results) No results for input(s): PROBNP in the last 8760 hours. HbA1C: No results for input(s): HGBA1C in the last 72 hours. CBG: Recent Labs  Lab 11/11/18 2121 11/12/18 0714 11/12/18 1155  GLUCAP 157* 86 156*   Lipid Profile: No results for input(s): CHOL, HDL, LDLCALC, TRIG, CHOLHDL, LDLDIRECT in the last 72 hours. Thyroid Function Tests: No results for input(s): TSH, T4TOTAL, FREET4,  T3FREE, THYROIDAB in the last 72 hours. Anemia Panel: Recent Labs    11/12/18 0341  VITAMINB12 215  FOLATE 56.5  FERRITIN 201  TIBC 181*  IRON 14*   Sepsis Labs: Recent Labs  Lab 11/11/18 1255  LATICACIDVEN 1.6    Recent Results (from the past 240 hour(s))  Blood Culture (routine x 2)     Status: None (Preliminary result)   Collection Time: 11/11/18 12:24 PM  Result Value Ref Range Status   Specimen Description   Final    BLOOD LEFT FOREARM Performed at [facility] [address], [place], [place] [address]    Special Requests   Final    BOTTLES DRAWN AEROBIC AND ANAEROBIC Blood Culture adequate volume Performed at [facility] [address] [place], [place] [address]    Culture   Final    NO GROWTH < 24 HOURS Performed at [facility] [address] [place], [place] [address]    Report Status PENDING  Incomplete  Blood Culture (routine x 2)  Status: None (Preliminary result)   Collection Time: 11/11/18 12:29 PM  Result Value Ref Range Status   Specimen Description   Final    BLOOD RIGHT FOREARM Performed at [facility] [address], [place], [place] [address]    Special Requests   Final    BOTTLES DRAWN AEROBIC AND ANAEROBIC Blood Culture adequate volume Performed at [facility] [address] [place], [place] [address]    Culture   Final    NO GROWTH < 24 HOURS Performed at [facility] [address] [place], [place] [address]    Report Status PENDING  Incomplete         Radiology Studies: Dg Chest 2 View  Result Date: 11/11/2018 CLINICAL DATA:  Fever and confusion EXAM: CHEST - 2 VIEW COMPARISON:  09/12/2018 FINDINGS: Heart size mildly enlarged. Negative for heart failure. Atherosclerotic aortic arch. Lungs are clear without infiltrate or effusion. IMPRESSION: No active cardiopulmonary disease. Electronically Signed   By: Franchot Gallo M.D.   On:  11/11/2018 13:17   Ct Head Wo Contrast  Result Date: 11/11/2018 CLINICAL DATA:  85 year old female with altered level consciousness since last night. Loss of appetite. Initial encounter. EXAM: CT HEAD WITHOUT CONTRAST TECHNIQUE: Contiguous axial images were obtained from the base of the skull through the vertex without intravenous contrast. COMPARISON:  09/12/2018. FINDINGS: Brain: No intracranial hemorrhage or CT evidence of large acute infarct. Prominent chronic microvascular changes. Global atrophy. No intracranial mass lesion noted on this unenhanced exam. Vascular: Prominent vascular calcifications. No acute hyperdense vessel. Skull: No acute abnormality. Sinuses/Orbits: Post lens replacement. No acute orbital abnormality. Visualized paranasal sinuses clear. Other: Mastoid air cells and middle ear cavities clear. IMPRESSION: 1. No intracranial hemorrhage or CT evidence of large acute infarct. 2. Prominent chronic microvascular changes. 3. Global atrophy. Electronically Signed   By: Genia Del M.D.   On: 11/11/2018 13:33        Scheduled Meds: . enoxaparin (LOVENOX) injection  30 mg Subcutaneous Q24H  . feeding supplement (ENSURE ENLIVE)  237 mL Oral BID BM  . fluconazole  200 mg Oral QHS  . folic acid  1 mg Oral Daily  . insulin aspart  0-5 Units Subcutaneous QHS  . insulin aspart  0-9 Units Subcutaneous TID WC  . leflunomide  20 mg Oral Daily  . levothyroxine  100 mcg Oral QAC breakfast  . magnesium oxide  400 mg Oral Daily  . Melatonin  3 mg Oral QHS  . mirabegron ER  25 mg Oral Daily  . mirtazapine  7.5 mg Oral QHS  . pantoprazole  40 mg Oral BID  . predniSONE  4 mg Oral Q breakfast   Continuous Infusions: . sodium chloride 75 mL/hr at 11/12/18 0930  . cefTRIAXone (ROCEPHIN)  IV    . cefTRIAXone (ROCEPHIN)  IV       LOS: 0 days    Time spent: 35 minutes    Irine Seal, MD [facility]  If 7PM-7AM, please contact night-coverage [email or web address] 11/12/2018,  2:33 PM

## 2018-11-12 NOTE — Consult Note (Signed)
[place] Nurse wound consult note Reason for Consult:Weekly Unna boots changed on Thursdays.  Skin tear is present to right posterior lower leg. Chronic nonhealing wound to right second toe  Scabbed and dry.   Wound type:Trauma, chronic nonhealing. Pressure Injury POA: NA Measurement: Toe:  0.3 cm dry scabbed lesion will continue silver hydrofiber dressing.  Right leg will be assessed Thursday when dressing is changed. Wound CKI:CHTVGVS Drainage (amount, consistency, odor) none Periwound:dry skin Dressing procedure/placement/frequency:Cleanse right toe wound with NS.  Apply Aquacel Ag to wound bed. Cover with dry dressing.  Change on Monday and Thursday with right leg Unna boot.  Will not follow at this time.  Please re-consult if needed.  Domenic Moras MSN, RN, FNP-BC CWON Wound, Ostomy, Continence Nurse Pager [phone]

## 2018-11-12 NOTE — Progress Notes (Addendum)
Initial Nutrition Assessment  DOCUMENTATION CODES:   Severe malnutrition in context of chronic illness, Underweight  INTERVENTION:   -Continue Ensure Enlive po BID, each supplement provides 350 kcal and 20 grams of protein -Provide Magic cup TID with meals, each supplement provides 290 kcal and 9 grams of protein  NUTRITION DIAGNOSIS:   Severe Malnutrition related to chronic illness, wound healing(dementia) as evidenced by energy intake < or equal to 75% for > or equal to 1 month, severe muscle depletion, moderate fat depletion.  GOAL:   Patient will meet greater than or equal to 90% of their needs  MONITOR:   PO intake, Supplement acceptance, Labs, Weight trends, I & O's, Skin  REASON FOR ASSESSMENT:   Malnutrition Screening Tool    ASSESSMENT:   85 y.o. female with medical history significant of atrial fibrillation, type 2 diabetes, rheumatoid arthritis, and multiple other medical problems presenting with confusion. Admitted for acute encephalopathy.   Patient in room with caregiver. Pt was awake and talkative but HOH and could not provide a lot of nutrition history. Pt with history of dementia. Per caregiver, she sees patient everyday. States pt typically eats a good breakfast of toast, an egg, bacon and sometimes grits and then a smaller dinner. [name] appetite fluctuates and on a bad day may eat 1/2 of her breakfast and very little dinner. She does drink Ensure but not consistently.  Currently pt is drinking a Ensure and ate 25% of her breakfast of toast, yogurt and coffee. Will order Magic Cups on meal trays. Denies issues swallowing or chewing.  Per caregiver, UBW is 87-88 lb. Weight remains near this weight. Pt did weigh 96 lb in November 2019, however weight loss is insignificant for time frame. Pt is underweight and severe malnutrition continues.  Medications: folic acid tablet daily, MAG-OX tablet daily, Remeron tablet daily, K-DUR tablet once  Labs reviewed: CBGs:  86-156 Low K   NUTRITION - FOCUSED PHYSICAL EXAM:    Most Recent Value  Orbital Region  Mild depletion  Upper Arm Region  Severe depletion  Thoracic and Lumbar Region  Unable to assess  Buccal Region  Moderate depletion  Temple Region  Severe depletion  Clavicle Bone Region  Severe depletion  Clavicle and Acromion Bone Region  Severe depletion  Scapular Bone Region  Severe depletion  Dorsal Hand  Severe depletion  Patellar Region  Severe depletion  Anterior Thigh Region  Severe depletion  Posterior Calf Region  Severe depletion  Edema (RD Assessment)  None       Diet Order:   Diet Order            Diet Heart Room service appropriate? Yes; Fluid consistency: Thin  Diet effective now              EDUCATION NEEDS:   Not appropriate for education at this time  Skin:  Skin Assessment: Per WOC note:  Chronic nonhealing wound to right second toe    Last BM:  PTA  Height:   Ht Readings from Last 1 Encounters:  11/11/18 5\' 6"  (1.676 m)    Weight:   Wt Readings from Last 1 Encounters:  11/11/18 40.6 kg    Ideal Body Weight:  59.1 kg  BMI:  Body mass index is 14.45 kg/m.  Estimated Nutritional Needs:   Kcal:  1610-9604  Protein:  65-75g  Fluid:  1.7L/day   Clayton Bibles, MS, RD, LDN [place] Dietitian Pager: [phone] After Hours Pager: [phone]

## 2018-11-13 DIAGNOSIS — D649 Anemia, unspecified: Secondary | ICD-10-CM

## 2018-11-13 DIAGNOSIS — I10 Essential (primary) hypertension: Secondary | ICD-10-CM

## 2018-11-13 LAB — BASIC METABOLIC PANEL
Anion gap: 9 (ref 5–15)
BUN: 12 mg/dL (ref 8–23)
CHLORIDE: 110 mmol/L (ref 98–111)
CO2: 20 mmol/L — AB (ref 22–32)
Calcium: 7.2 mg/dL — ABNORMAL LOW (ref 8.9–10.3)
Creatinine, Ser: 0.51 mg/dL (ref 0.44–1.00)
GFR calc Af Amer: >60 mL/min (ref >60)
GFR calc non Af Amer: >60 mL/min (ref >60)
Glucose, Bld: 130 mg/dL — ABNORMAL HIGH (ref 70–99)
POTASSIUM: 3.8 mmol/L (ref 3.5–5.1)
Sodium: 139 mmol/L (ref 135–145)

## 2018-11-13 LAB — CBC
HCT: 26 % — ABNORMAL LOW (ref 36.0–46.0)
Hemoglobin: 7.7 g/dL — ABNORMAL LOW (ref 12.0–15.0)
MCH: 27.5 pg (ref 26.0–34.0)
MCHC: 29.6 g/dL — ABNORMAL LOW (ref 30.0–36.0)
MCV: 92.9 fL (ref 80.0–100.0)
Platelets: 267 10*3/uL (ref 150–400)
RBC: 2.8 MIL/uL — AB (ref 3.87–5.11)
RDW: 17.5 % — ABNORMAL HIGH (ref 11.5–15.5)
WBC: 5.6 10*3/uL (ref 4.0–10.5)
nRBC: 0 % (ref 0.0–0.2)

## 2018-11-13 LAB — HEMOGLOBIN AND HEMATOCRIT, BLOOD
HCT: 28.8 % — ABNORMAL LOW (ref 36.0–46.0)
Hemoglobin: 8.4 g/dL — ABNORMAL LOW (ref 12.0–15.0)

## 2018-11-13 LAB — GLUCOSE, CAPILLARY
Glucose-Capillary: 112 mg/dL — ABNORMAL HIGH (ref 70–99)
Glucose-Capillary: 149 mg/dL — ABNORMAL HIGH (ref 70–99)
Glucose-Capillary: 254 mg/dL — ABNORMAL HIGH (ref 70–99)
Glucose-Capillary: 203 mg/dL — ABNORMAL HIGH (ref 70–99)

## 2018-11-13 MED ORDER — AMLODIPINE BESYLATE 5 MG PO TABS
2.5000 mg | ORAL_TABLET | Freq: Every day | ORAL | Status: DC
  Administered 2018-11-13: 2.5 mg via ORAL
  Filled 2018-11-13: qty 1

## 2018-11-13 MED ORDER — SODIUM CHLORIDE 0.9 % IV SOLN
510.0000 mg | Freq: Once | INTRAVENOUS | Status: AC
  Administered 2018-11-13: 510 mg via INTRAVENOUS
  Filled 2018-11-13: qty 17

## 2018-11-13 MED ORDER — FOSFOMYCIN TROMETHAMINE 3 G PO PACK
3.0000 g | PACK | Freq: Once | ORAL | Status: AC
  Administered 2018-11-13: 3 g via ORAL
  Filled 2018-11-13: qty 3

## 2018-11-13 MED ORDER — POLYSACCHARIDE IRON COMPLEX 150 MG PO CAPS: 150.0000 mg | ORAL_CAPSULE | Freq: Every day | ORAL | Status: DC

## 2018-11-13 MED ORDER — SODIUM CHLORIDE 0.9 % IV SOLN: 1000.0000 mL | INTRAVENOUS | Status: DC

## 2018-11-13 NOTE — Progress Notes (Addendum)
PROGRESS NOTE    [name]  HRC:[id number] DOB: 1929-01-09 DOA: 11/11/2018 PCP: Deland Pretty, MD    Brief Narrative:  Patient is a pleasant 85 year old female history of A. fib, type 2 diabetes,, rheumatoid arthritis, multiple other medical problems who presented to the ED with worsening confusion.  Patient admitted for acute encephalopathy felt secondary to a UTI.   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  Head CT done on admission was negative for any acute intracranial abnormality, prominent chronic microvascular changes, global atrophy.  Chest x-ray done was negative for any acute infiltrate.  Urinalysis with many bacteria, nitrite negative, greater than 50 WBCs.  Urine cultures with greater than 100,000 colonies of Enterococcus faecalis.  Patient was started on IV Rocephin which we will discontinue.  Place on oral fosfomycin.  Decrease IV fluids to 50 cc/h.  Supportive care.    2.  Enterococcus faecalis UTI  Urinalysis done on admission was concerning for UTI with many bacteria WBCs as well as yeast.  Urine cultures with greater than 100,000 colonies of Enterococcus faecalis.  Sensitivities pending.  Patient received a dose of IV Rocephin on 11/12/2018.  Discontinue IV Rocephin and placed on oral fosfomycin.  Continue fluconazole.   3.  Lower extremity wounds Patient with right lower extremity wound with dressing intact.  Wound nurse  consulted.  Continue current wound care.  4.  Rheumatoid arthritis Stable.  Continue Arava, prednisone, folic acid.  Outpatient follow-up.  5.  Hypothyroidism Continue home dose Synthroid.  6.  Gastroesophageal reflux disease Continue PPI twice daily.  7.  Diabetes mellitus type 2 Hemoglobin A1c was 6.4 on 04/26/2018.  CBG of 112 this morning.  Continue to hold Tradjenta and metformin.  Continue sliding scale insulin.  8.  Dementia Stable.  Outpatient follow-up.  9.  Atrial fibrillation Rate controlled.  Not on anticoagulation secondary to history of falls.  Outpatient follow-up.  10 protein calorie malnutrition Continue nutritional supplementation.  11.  Iron deficiency anemia Likely dilutional in the setting of iron deficiency anemia.  Patient with no overt bleeding.  Hemoglobin at 7.7 this morning from 8.6 on 11/12/2018.  We will give a dose of IV Feraheme.  Decrease IV fluids to 50 cc/h.  Repeat H&H this afternoon.  Start oral iron supplementation tomorrow...    DVT prophylaxis: Lovenox Code Status: Full Family Communication: Updated caregiver at bedside.  Updated son Clovis Cao via telephone [phone]) Disposition Plan: Likely back home if continued improvement in the next 24 to 48 hours.   Consultants:   None  Procedures:   CT head 11/11/2018  Chest x-ray 11/11/2018    Antimicrobials:   IV Rocephin 11/12/2018>>>> 11/13/2018  Fosfomycin x1 dose 11/13/2018   Subjective: Sitting up in chair.  Alert to self and place.  Thinks it is 1919.  Denies any chest pain no shortness of breath.  No abdominal pain.  Denied any dysuria this morning.  Confusion improving however not at baseline.  Objective: Vitals:   11/12/18 0637 11/12/18 1419 11/12/18 2048 11/13/18  0644  BP: (!) 149/73 (!) 150/76 (!) 153/78 (!) 171/93  Pulse: 68 76 68 71  Resp: 17 16 17 19   Temp: 98.4 F (36.9 C) 99.6 F (37.6 C) 97.9 F (36.6 C) 97.7 F (36.5 C)  TempSrc: Oral Oral Oral Oral    SpO2: 94% 99% 97% 100%  Weight:      Height:        Intake/Output Summary (Last 24 hours) at 11/13/2018 1225 Last data filed at 11/13/2018 1001 Gross per 24 hour  Intake 664.56 ml  Output 150 ml  Net 514.56 ml   Filed Weights   11/11/18 1207 11/11/18 1808  Weight: 40.8 kg 40.6 kg    Examination:  General exam: Appears calm and comfortable  Respiratory system: Clear to auscultation. Respiratory effort normal. Cardiovascular system: S1 & S2 heard, RRR. No JVD, murmurs, rubs, gallops or clicks. No pedal edema. Gastrointestinal system: Abdomen is nondistended, soft and nontender. No organomegaly or masses felt. Normal bowel sounds heard. Central nervous system: Alert and oriented to self and place.. No focal neurological deficits. Extremities: Symmetric 5 x 5 power.  Bilateral hands with chronic changes of rheumatoid arthritis. Skin: No rashes, lesions or ulcers Psychiatry: Judgement and insight appear normal. Mood & affect appropriate.     Data Reviewed: I have personally reviewed following labs and imaging studies  CBC: Recent Labs  Lab 11/11/18 1224 11/12/18 0341 11/13/18 0328  WBC 8.6 5.3 5.6  NEUTROABS 6.6  --   --   HGB 9.2* 8.6* 7.7*  HCT 30.3* 29.0* 26.0*  MCV 91.0 90.6 92.9  PLT 263 253 211   Basic Metabolic Panel: Recent Labs  Lab 11/11/18 1224 11/12/18 0341 11/13/18 0328  NA 136 137 139  K 3.5 3.4* 3.8  CL 99 105 110  CO2 23 23 20*  GLUCOSE 126* 95 130*  BUN 12 12 12   CREATININE 0.66 0.64 0.51  CALCIUM 7.7* 7.2* 7.2*   GFR: Estimated Creatinine Clearance: 31.2 mL/min (by C-G formula based on SCr of 0.51 mg/dL). Liver Function Tests: Recent Labs  Lab 11/11/18 1224 11/12/18 0341  AST 18 15  ALT 10 7  ALKPHOS 82 67  BILITOT 0.9 0.4  PROT 6.5 5.7*  ALBUMIN 2.8* 2.4*   No results for input(s): LIPASE, AMYLASE in the last 168 hours. No results for input(s): AMMONIA in the last 168 hours. Coagulation Profile: No results for input(s): INR,  PROTIME in the last 168 hours. Cardiac Enzymes: No results for input(s): CKTOTAL, CKMB, CKMBINDEX, TROPONINI in the last 168 hours. BNP (last 3 results) No results for input(s): PROBNP in the last 8760 hours. HbA1C: No results for input(s): HGBA1C in the last 72 hours. CBG: Recent Labs  Lab 11/12/18 1155 11/12/18 1802 11/12/18 2051 11/13/18 0812 11/13/18 1144  GLUCAP 156* 186* 228* 112* 149*   Lipid Profile: No results for input(s): CHOL, HDL, LDLCALC, TRIG, CHOLHDL, LDLDIRECT in the last 72 hours. Thyroid Function Tests: No results for input(s): TSH, T4TOTAL, FREET4, T3FREE, THYROIDAB in the last 72 hours. Anemia Panel: Recent Labs    11/12/18 0341  VITAMINB12 215  FOLATE 56.5  FERRITIN 201  TIBC 181*  IRON 14*   Sepsis Labs: Recent Labs  Lab 11/11/18 1255  LATICACIDVEN 1.6    Recent Results (from the past 240 hour(s))  Blood Culture (routine x 2)     Status: None (Preliminary result)   Collection Time: 11/11/18 12:24 PM  Result Value Ref Range Status   Specimen Description   Final  BLOOD LEFT FOREARM Performed at [facility] [address], [place], [place] [address]    Special Requests   Final    BOTTLES DRAWN AEROBIC AND ANAEROBIC Blood Culture adequate volume Performed at [facility] [address], [place], [place] [address]    Culture   Final    NO GROWTH 2 DAYS Performed at [facility] [address], [place], [place] [address]    Report Status PENDING  Incomplete  Blood Culture (routine x 2)     Status: None (Preliminary result)   Collection Time: 11/11/18 12:29 PM  Result Value Ref Range Status   Specimen Description   Final    BLOOD RIGHT FOREARM Performed at [facility] [address], [place], [place] [address]    Special Requests   Final    BOTTLES DRAWN AEROBIC AND ANAEROBIC Blood Culture adequate volume Performed at [facility] [address], [place], [place] [address]    Culture   Final    NO GROWTH 2 DAYS Performed at [facility] [address] [place], [place] [address]    Report Status PENDING  Incomplete  Urine culture     Status: Abnormal (Preliminary result)   Collection Time: 11/11/18  3:42 PM  Result Value Ref Range Status   Specimen Description   Final    URINE, CLEAN CATCH Performed at [facility], [place] [address], [place], [place] [address]    Special Requests   Final    NONE Performed at [facility], [place] [address] [place], [place] [address]    Culture >=100,000 COLONIES/mL ENTEROCOCCUS FAECALIS (A)  Final   Report Status PENDING  Incomplete  MRSA PCR Screening     Status: None   Collection Time: 11/12/18  3:23 PM  Result Value Ref Range Status   MRSA by PCR NEGATIVE NEGATIVE Final    Comment:        The GeneXpert MRSA Assay (FDA approved for NASAL specimens only), is one component of a comprehensive MRSA colonization surveillance program. It is not intended to diagnose MRSA infection nor to guide or monitor treatment for MRSA infections. Performed at [facility], [place] [address], [place],  [address]          Radiology Studies: Dg Chest 2 View  Result Date: 11/11/2018 CLINICAL DATA:  Fever and confusion EXAM: CHEST - 2 VIEW COMPARISON:  09/12/2018 FINDINGS: Heart size mildly enlarged. Negative for heart failure. Atherosclerotic aortic arch. Lungs are clear without infiltrate or effusion. IMPRESSION: No active cardiopulmonary disease. Electronically Signed   By: Franchot Gallo M.D.   On: 11/11/2018 13:17   Ct Head Wo Contrast  Result Date: 11/11/2018 CLINICAL DATA:  85 year old female with altered level consciousness since last night. Loss of appetite. Initial encounter. EXAM: CT HEAD WITHOUT CONTRAST TECHNIQUE: Contiguous axial images were obtained from the base of the skull through the vertex without intravenous  contrast. COMPARISON:  09/12/2018. FINDINGS: Brain: No intracranial hemorrhage or CT evidence of large acute infarct. Prominent chronic microvascular changes. Global atrophy. No intracranial mass lesion noted on this unenhanced exam. Vascular: Prominent vascular calcifications. No acute hyperdense vessel. Skull: No acute abnormality. Sinuses/Orbits: Post lens replacement. No acute orbital abnormality. Visualized paranasal sinuses clear. Other: Mastoid air cells and middle ear cavities clear. IMPRESSION: 1. No intracranial hemorrhage or CT evidence of large acute infarct. 2. Prominent chronic microvascular changes. 3. Global atrophy. Electronically Signed  By: Genia Del M.D.   On: 11/11/2018 13:33        Scheduled Meds: . amLODipine  2.5 mg Oral Daily  . enoxaparin (LOVENOX) injection  30 mg Subcutaneous Q24H  . feeding supplement (ENSURE ENLIVE)  237 mL Oral BID BM  . fluconazole  200 mg Oral QHS  . folic acid  1 mg Oral Daily  . fosfomycin  3 g Oral Once  . insulin aspart  0-5 Units Subcutaneous QHS  . insulin aspart  0-9 Units Subcutaneous TID WC  . [START ON 11/14/2018] iron polysaccharides  150 mg Oral Daily  . leflunomide  20 mg Oral Daily  . levothyroxine  100 mcg Oral QAC breakfast  . magnesium oxide  400 mg Oral Daily  . Melatonin  3 mg Oral QHS  . mirabegron ER  25 mg Oral Daily  . mirtazapine  7.5 mg Oral QHS  . pantoprazole  40 mg Oral BID  . predniSONE  4 mg Oral Q breakfast   Continuous Infusions: . sodium chloride 50 mL/hr at 11/13/18 0823  . cefTRIAXone (ROCEPHIN)  IV       LOS: 1 day    Time spent: 35 minutes    Irine Seal, MD [facility]  If 7PM-7AM, please contact night-coverage [email or web address] 11/13/2018, 12:25 PM

## 2018-11-13 NOTE — Progress Notes (Signed)
Occupational Therapy Treatment Patient Details Name: [name] MRN: [id number] DOB: Nov 07, 1928 Today's Date: 11/13/2018    History of present illness 85 y.o. female admittedo to ED with acute encephalopathy, UTI, RLE wound. Head CT - for acute findings. PMH includes dementia, afib, GIB, DM, hypothyroidism, GERD, diverticulosis, HTN, RA, anemia, depression, UTIs, TIA, syncope, R hip fracture, ORIF distal femur 2013.    OT comments  Pt confused during OT session.   Do not feel pt could call 911 or push her life alert button.  Per yesterday pts home nurse said could call 911.  Do not feel pt should be home alone.  Pt does not have 24/7 A.      Follow Up Recommendations  Home health OT;SNF;Supervision/Assistance - 24 hour    Equipment Recommendations  None recommended by OT    Recommendations for Other Services      Precautions / Restrictions Precautions Precautions: Fall       Mobility Bed Mobility Overal bed mobility: Needs Assistance Bed Mobility: Supine to Sit              Transfers Overall transfer level: Needs assistance Equipment used: None Transfers: Squat Pivot Transfers     Squat pivot transfers: Total assist     General transfer comment: squat pivot transfer.  Aide reports picking pt up and moving her to location     Balance Overall balance assessment: Needs assistance Sitting-balance support: Feet supported;Bilateral upper extremity supported Sitting balance-Leahy Scale: Poor Sitting balance - Comments: Pt able to sit EOB without OT support, but relies on UEs and LEs to balance    Standing balance support: No upper extremity supported Standing balance-Leahy Scale: Zero                             ADL either performed or assessed with clinical judgement   ADL Overall ADL's : Needs assistance/impaired Eating/Feeding: Maximal assistance;Sitting Eating/Feeding Details (indicate cue type and reason): OT provided pt cup with lid  and handles in which pt able to hold and be more I with drinking due to RA in hands.   Grooming: Wash/dry face;Sitting;Moderate assistance                   Toilet Transfer: +2 for physical assistance;+2 for safety/equipment;Cueing for safety;Cueing for sequencing;Squat-pivot Toilet Transfer Details (indicate cue type and reason): bed to chair Toileting- Clothing Manipulation and Hygiene: +2 for physical assistance;+2 for safety/equipment;Total assistance;Sitting/lateral lean;Cueing for safety;Cueing for sequencing               Vision Patient Visual Report: No change from baseline            Cognition Arousal/Alertness: Awake/alert Behavior During Therapy: WFL for tasks assessed/performed Overall Cognitive Status: Impaired/Different from baseline Area of Impairment: Memory;Awareness;Problem solving;Safety/judgement;Orientation                 Orientation Level: Disoriented to;Place       Safety/Judgement: Decreased awareness of safety;Decreased awareness of deficits   Problem Solving: Requires verbal cues;Difficulty sequencing;Requires tactile cues General Comments: Pt aware of self, birthday, and location, but states "I am at [employer] in [place]". Pt with difficulty telling me what she does on a daily basis at home. Per pt's aide, pt loves to shop daily at [facility] and [employer] but pt unable to recall [employer]. When PT was asking pt questions, pt states "you're getting me all confused"  Pertinent Vitals/ Pain       Pain Assessment: No/denies pain     Prior Functioning/Environment              Frequency  Min 2X/week        Progress Toward Goals  OT Goals(current goals can now be found in the care plan section)  Progress towards OT goals: Progressing toward goals     Plan Discharge plan remains appropriate       AM-PAC OT "6 Clicks" Daily Activity     Outcome Measure   Help from another person eating meals?: A  Lot Help from another person taking care of personal grooming?: A Lot Help from another person toileting, which includes using toliet, bedpan, or urinal?: Total Help from another person bathing (including washing, rinsing, drying)?: Total Help from another person to put on and taking off regular upper body clothing?: A Lot Help from another person to put on and taking off regular lower body clothing?: Total 6 Click Score: 9    End of Session    OT Visit Diagnosis: Other abnormalities of gait and mobility (R26.89);Repeated falls (R29.6);History of falling (Z91.81);Muscle weakness (generalized) (M62.81);Unsteadiness on feet (R26.81)   Activity Tolerance Patient tolerated treatment well   Patient Left in chair;with call bell/phone within reach;with family/visitor present;with chair alarm set   Nurse Communication Mobility status        Time: 1018-1040 OT Time Calculation (min): 22 min  Charges: OT General Charges $OT Visit: 1 Visit OT Treatments $Self Care/Home Management : 8-22 mins  Kari Baars, [place] [phone] Office- [phone]      Steven Basso, Edwena Felty D 11/13/2018, 1:26 PM

## 2018-11-14 DIAGNOSIS — B3749 Other urogenital candidiasis: Secondary | ICD-10-CM

## 2018-11-14 LAB — BASIC METABOLIC PANEL
Anion gap: 12 (ref 5–15)
BUN: 8 mg/dL (ref 8–23)
CO2: 22 mmol/L (ref 22–32)
Calcium: 7.8 mg/dL — ABNORMAL LOW (ref 8.9–10.3)
Chloride: 105 mmol/L (ref 98–111)
Creatinine, Ser: 0.48 mg/dL (ref 0.44–1.00)
GFR calc Af Amer: >60 mL/min (ref >60)
GFR calc non Af Amer: >60 mL/min (ref >60)
Glucose, Bld: 149 mg/dL — ABNORMAL HIGH (ref 70–99)
Potassium: 3.8 mmol/L (ref 3.5–5.1)
Sodium: 139 mmol/L (ref 135–145)

## 2018-11-14 LAB — URINE CULTURE: Culture: >=100000 — AB

## 2018-11-14 LAB — CBC
HCT: 34.7 % — ABNORMAL LOW (ref 36.0–46.0)
HEMOGLOBIN: 10 g/dL — AB (ref 12.0–15.0)
MCH: 26.8 pg (ref 26.0–34.0)
MCHC: 28.8 g/dL — ABNORMAL LOW (ref 30.0–36.0)
MCV: 93 fL (ref 80.0–100.0)
Platelets: 404 10*3/uL — ABNORMAL HIGH (ref 150–400)
RBC: 3.73 MIL/uL — ABNORMAL LOW (ref 3.87–5.11)
RDW: 17.5 % — ABNORMAL HIGH (ref 11.5–15.5)
WBC: 7.4 10*3/uL (ref 4.0–10.5)
nRBC: 0 % (ref 0.0–0.2)

## 2018-11-14 LAB — GLUCOSE, CAPILLARY
Glucose-Capillary: 155 mg/dL — ABNORMAL HIGH (ref 70–99)
Glucose-Capillary: 181 mg/dL — ABNORMAL HIGH (ref 70–99)
Glucose-Capillary: 81 mg/dL (ref 70–99)

## 2018-11-14 MED ORDER — AMLODIPINE BESYLATE 2.5 MG PO TABS: 2.5000 mg | ORAL_TABLET | Freq: Every day | ORAL | 0 refills | Status: DC

## 2018-11-14 MED ORDER — SENNA 8.6 MG PO TABS: 1.0000 | ORAL_TABLET | Freq: Every day | ORAL | 0 refills | Status: DC

## 2018-11-14 MED ORDER — FLUCONAZOLE 100 MG PO TABS: 100.0000 mg | ORAL_TABLET | Freq: Every day | ORAL | 0 refills | Status: AC

## 2018-11-14 MED ORDER — POLYSACCHARIDE IRON COMPLEX 150 MG PO CAPS: 150.0000 mg | ORAL_CAPSULE | Freq: Every day | ORAL | 0 refills | Status: DC

## 2018-11-14 NOTE — Care Management Important Message (Signed)
Important Message  Patient Details  Name: [name] MRN: [id number] Date of Birth: [birth year]   Medicare Important Message Given:  Yes    Kerin Salen 11/14/2018, 11:28 [place] Message  Patient Details  Name: [name] MRN: [id number] Date of Birth: [birth year]   Medicare Important Message Given:  Yes    Kerin Salen 11/14/2018, 11:28 AM

## 2018-11-14 NOTE — Care Management (Signed)
CM consult to speak with son about 24hr care. This CM called son Dominica Severin and he states that they plan on increasing her care at home until she is back to baseline. He states that "they have been doing this a long time with her and know what she needs at home". Marney Doctor RN,BSN [phone]

## 2018-11-14 NOTE — Discharge Summary (Signed)
Physician Discharge Summary  OKLA QAZI SHF:[id number] DOB: 12-31-1928 DOA: 11/11/2018  PCP: Deland Pretty, MD  Admit date: 11/11/2018 Discharge date: 11/14/2018  Time spent: 55 minutes  Recommendations for Outpatient Follow-up:  1. Follow-up with Deland Pretty, MD in 2 to 3 weeks.   Discharge Diagnoses:  Principal Problem:   Acute encephalopathy Active Problems:   Type 2 diabetes mellitus with other specified complication (HCC)   Essential hypertension   Rheumatoid arthritis involving both hands with positive rheumatoid factor (HCC)   History of gastroesophageal reflux (GERD)   Protein-calorie malnutrition, severe   Urinary tract infection without hematuria   Dementia without behavioral disturbance ([place])   Discharge Condition: Stable and improved  Diet recommendation: Carb modified  Filed Weights   11/11/18 1207 11/11/18 1808  Weight: 40.8 kg 40.6 kg    History of present illness:  Per Dr. Antony Blackbird is a 85 y.o. female with medical history significant of atrial fibrillation, type 2 diabetes, rheumatoid arthritis, and multiple other medical problems presented with confusion.   History was limited as the patient was unable to give a history with her mental status.  The current caregiver assist with the history.  Opal Sidles noted that she helps take care of the patient from Tuesday to Friday.  She stated that she was typically a little bit confused on Tuesday, but this improves over the next day or so.  She thinks that this episode is similar to this.  She is normally alert and oriented x3.  She is weak globally.  She is wheelchair-bound.  Any chest pain, shortness of breath, nausea or vomiting.  She does feel confused.  ED Course: labs, imaging, UA, ekg.  Admit for AMS with UTI.  Hospital Course:  1 acute encephalopathy likely secondary to Enterococcus faecalis UTI in the setting of underlying dementia Patient presented with worsening confusion.  Head CT  done on admission was negative for any acute intracranial abnormality, prominent chronic microvascular changes, global atrophy.  Chest x-ray done was negative for any acute infiltrate.  Urinalysis with many bacteria, nitrite negative, greater than 50 WBCs.  Urine cultures with greater than 100,000 colonies of Enterococcus faecalis.  Patient was started on IV Rocephin which was subsequently discontinued and patient placed on oral fosfomycin x1 dose.  Patient was hydrated with IV fluids gently which was discontinued.  Patient improved clinically and was close to baseline by day of discharge.  No further antibiotics needed.   2.  Enterococcus faecalis UTI/candiduria Urinalysis done on admission was concerning for UTI with many bacteria WBCs as well as yeast.  Urine cultures with greater than 100,000 colonies of Enterococcus faecalis.  Patient received a dose of IV Rocephin on 11/12/2018.    IV Rocephin was subsequently discontinued and patient given a dose of oral fosfomycin.  Patient was also maintained on fluconazole.  No further antibiotics needed on discharge.  Patient will be discharged on 4 more days of oral fluconazole to complete a course of antifungal. Outpatient follow-up.   3.  Lower extremity wounds Patient with right lower extremity wound with dressing intact.  Wound nurse consulted.   Patient was maintained on dressing changes and wound care during the hospitalization.   4.  Rheumatoid arthritis Patient was continued on home regimen of Arava, prednisone, folic acid.  Outpatient follow-up.  5.  Hypothyroidism Patient maintained on home regimen Synthroid.   6.  Gastroesophageal reflux disease Patient maintained on a PPI twice daily during the hospitalization.    7.  Diabetes mellitus type 2 Hemoglobin A1c was 6.4 on 04/26/2018.  Patient is Tradjenta and metformin were held during the hospitalization and patient maintained on sliding scale insulin.  Outpatient follow-up.  8.   Dementia Remained stable during the hospitalization.    9.  Atrial fibrillation Rate controlled.  Not on anticoagulation secondary to history of falls.  Outpatient follow-up.  10 protein calorie malnutrition Patient maintained on nutritional supplementation.   11.  Iron deficiency anemia Likely dilutional in the setting of iron deficiency anemia.  Patient with no overt bleeding.  Hemoglobin trickling down to as low as 7.7.  Patient given a dose of IV Feraheme.  IV fluids saline lock.  Hemoglobin improved and was up to 10.0 by day of discharge.  Patient was discharged on oral iron supplementation.  Outpatient follow-up with PCP.    Procedures:  CT head 11/11/2018  Chest x-ray 11/11/2018    Consultations:  None  Discharge Exam: Vitals:   11/14/18 0656 11/14/18 1303  BP: 114/85 (!) 137/94  Pulse: 80 (!) 121  Resp: 20 16  Temp: (!) 96.5 F (35.8 C) 97.8 F (36.6 C)  SpO2: 93% 90%    General: NAD Cardiovascular: RRR Respiratory: CTAB  Discharge Instructions   Discharge Instructions    Diet Carb Modified   Complete by:  As directed    Increase activity slowly   Complete by:  As directed      Allergies as of 11/14/2018      Reactions   Metformin And Related Diarrhea      Medication List    TAKE these medications   acetaminophen 325 MG tablet Commonly known as:  TYLENOL Take 2 tablets (650 mg total) by mouth every 6 (six) hours as needed for mild pain, fever or headache (or Fever >/= 101).   amLODipine 2.5 MG tablet Commonly known as:  NORVASC Take 1 tablet (2.5 mg total) by mouth daily.   benzonatate 200 MG capsule Commonly known as:  TESSALON Take 200 mg by mouth 3 (three) times daily as needed for cough.   calcium carbonate 500 MG chewable tablet Commonly known as:  TUMS - dosed in mg elemental calcium Chew 1 tablet (200 mg of elemental calcium total) by mouth 2 (two) times daily.   CENTRUM SILVER 50+WOMEN Tabs Take 1 tablet by mouth daily.    collagenase ointment Commonly known as:  SANTYL Apply topically daily. Apply Santyl to right 2nd toe wound Q day, then cover with moist 2X2 and foam dressing.  (Change foam dressing Q 3 days or PRN soiling.) What changed:    how much to take  when to take this  additional instructions   feeding supplement (ENSURE ENLIVE) Liqd Take 237 mLs by mouth 2 (two) times daily between meals.   fluconazole 100 MG tablet Commonly known as:  DIFLUCAN Take 1 tablet (100 mg total) by mouth at bedtime for 4 days. Start taking on:  November 15, 1910   folic acid 1 MG tablet Commonly known as:  FOLVITE Take 1 mg by mouth daily.   iron polysaccharides 150 MG capsule Commonly known as:  NIFEREX Take 1 capsule (150 mg total) by mouth daily.   leflunomide 20 MG tablet Commonly known as:  ARAVA Take 20 mg by mouth daily.   levothyroxine 100 MCG tablet Commonly known as:  SYNTHROID Take 1 tablet (100 mcg total) by mouth daily.   LIVALO 2 MG Tabs Generic drug:  Pitavastatin Calcium Take 2 mg by mouth every Monday, Wednesday,  and Friday.   magnesium oxide 400 (241.3 Mg) MG tablet Commonly known as:  MAG-OX Take 0.5 tablets (200 mg total) by mouth 2 (two) times daily. What changed:    how much to take  when to take this   Melatonin 2.5 MG Caps Take 1 capsule (2.5 mg total) by mouth at bedtime as needed (for sleep). What changed:  reasons to take this   metFORMIN 500 MG 24 hr tablet Commonly known as:  GLUCOPHAGE-XR Take 500 mg by mouth 2 (two) times daily.   mirtazapine 7.5 MG tablet Commonly known as:  REMERON Take 7.5 mg by mouth at bedtime.   MYRBETRIQ 25 MG Tb24 tablet Generic drug:  mirabegron ER Take 25 mg by mouth daily.   ondansetron 4 MG tablet Commonly known as:  ZOFRAN Take 1 tablet (4 mg total) by mouth every 6 (six) hours as needed for nausea.   pantoprazole 40 MG tablet Commonly known as:  PROTONIX Take 1 tablet (40 mg total) by mouth 2 (two) times daily.    predniSONE 1 MG tablet Commonly known as:  DELTASONE Take 4 mg by mouth daily with breakfast.   senna 8.6 MG Tabs tablet Commonly known as:  SENOKOT Take 1 tablet (8.6 mg total) by mouth at bedtime.   TRADJENTA 5 MG Tabs tablet Generic drug:  linagliptin Take 5 mg by mouth daily.   VOLTAREN 1 % Gel Generic drug:  diclofenac sodium Apply 4 g topically 4 (four) times daily as needed (pain). Apply to hands and knees      Allergies  Allergen Reactions  . Metformin And Related Diarrhea   Follow-up Information    Deland Pretty, MD. Schedule an appointment as soon as possible for a visit in 2 week(s).   Specialty:  Internal Medicine Why:  F/U IN 2-3 WEEKS. Contact information: [address] [place] [address] [phone]            The results of significant diagnostics from this hospitalization (including imaging, microbiology, ancillary and laboratory) are listed below for reference.    Significant Diagnostic Studies: Dg Chest 2 View  Result Date: 11/11/2018 CLINICAL DATA:  Fever and confusion EXAM: CHEST - 2 VIEW COMPARISON:  09/12/2018 FINDINGS: Heart size mildly enlarged. Negative for heart failure. Atherosclerotic aortic arch. Lungs are clear without infiltrate or effusion. IMPRESSION: No active cardiopulmonary disease. Electronically Signed   By: Franchot Gallo M.D.   On: 11/11/2018 13:17   Ct Head Wo Contrast  Result Date: 11/11/2018 CLINICAL DATA:  85 year old female with altered level consciousness since last night. Loss of appetite. Initial encounter. EXAM: CT HEAD WITHOUT CONTRAST TECHNIQUE: Contiguous axial images were obtained from the base of the skull through the vertex without intravenous contrast. COMPARISON:  09/12/2018. FINDINGS: Brain: No intracranial hemorrhage or CT evidence of large acute infarct. Prominent chronic microvascular changes. Global atrophy. No intracranial mass lesion noted on this unenhanced exam. Vascular: Prominent  vascular calcifications. No acute hyperdense vessel. Skull: No acute abnormality. Sinuses/Orbits: Post lens replacement. No acute orbital abnormality. Visualized paranasal sinuses clear. Other: Mastoid air cells and middle ear cavities clear. IMPRESSION: 1. No intracranial hemorrhage or CT evidence of large acute infarct. 2. Prominent chronic microvascular changes. 3. Global atrophy. Electronically Signed   By: Genia Del M.D.   On: 11/11/2018 13:33    Microbiology: Recent Results (from the past 240 hour(s))  Blood Culture (routine x 2)     Status: None (Preliminary result)   Collection Time: 11/11/18 12:24 PM  Result Value Ref Range Status   Specimen Description BLOOD LEFT FOREARM  Final   Special Requests   Final    BOTTLES DRAWN AEROBIC AND ANAEROBIC Blood Culture adequate volume Performed at [facility] [address] [place], [place] [address]    Culture NO GROWTH 3 DAYS  Final   Report Status PENDING  Incomplete  Blood Culture (routine x 2)     Status: None (Preliminary result)   Collection Time: 11/11/18 12:29 PM  Result Value Ref Range Status   Specimen Description BLOOD RIGHT FOREARM  Final   Special Requests   Final    BOTTLES DRAWN AEROBIC AND ANAEROBIC Blood Culture adequate volume Performed at [facility] [address], [place], [place] [address]    Culture NO GROWTH 3 DAYS  Final   Report Status PENDING  Incomplete  Urine culture     Status: Abnormal   Collection Time: 11/11/18  3:42 PM  Result Value Ref Range Status   Specimen Description   Final    URINE, CLEAN CATCH Performed at [facility], [place] [address], [place], [place] [address]    Special Requests   Final    NONE Performed at [facility], [place] [address], [place], [place] [address]    Culture >=100,000 COLONIES/mL ENTEROCOCCUS FAECALIS (A)  Final   Report Status 11/14/2018 FINAL  Final   Organism ID, Bacteria ENTEROCOCCUS  FAECALIS (A)  Final      Susceptibility   Enterococcus faecalis - MIC*    AMPICILLIN <=2 SENSITIVE Sensitive     LEVOFLOXACIN 1 SENSITIVE Sensitive     NITROFURANTOIN <=16 SENSITIVE Sensitive     VANCOMYCIN 1 SENSITIVE Sensitive     * >=100,000 COLONIES/mL ENTEROCOCCUS FAECALIS  MRSA PCR Screening     Status: None   Collection Time: 11/12/18  3:23 PM  Result Value Ref Range Status   MRSA by PCR NEGATIVE NEGATIVE Final    Comment:        The GeneXpert MRSA Assay (FDA approved for NASAL specimens only), is one component of a comprehensive MRSA colonization surveillance program. It is not intended to diagnose MRSA infection nor to guide or monitor treatment for MRSA infections. Performed at [facility], [place] [address] [place], [place] [address]      Labs: Basic Metabolic Panel: Recent Labs  Lab 11/11/18 1224 11/12/18 0341 11/13/18 0328 11/14/18 0329  NA 136 137 139 139  K 3.5 3.4* 3.8 3.8  CL 99 105 110 105  CO2 23 23 20* 22  GLUCOSE 126* 95 130* 149*  BUN 12 12 12 8   CREATININE 0.66 0.64 0.51 0.48  CALCIUM 7.7* 7.2* 7.2* 7.8*   Liver Function Tests: Recent Labs  Lab 11/11/18 1224 11/12/18 0341  AST 18 15  ALT 10 7  ALKPHOS 82 67  BILITOT 0.9 0.4  PROT 6.5 5.7*  ALBUMIN 2.8* 2.4*   No results for input(s): LIPASE, AMYLASE in the last 168 hours. No results for input(s): AMMONIA in the last 168 hours. CBC: Recent Labs  Lab 11/11/18 1224 11/12/18 0341 11/13/18 0328 11/13/18 1354 11/14/18 0329  WBC 8.6 5.3 5.6  --  7.4  NEUTROABS 6.6  --   --   --   --   HGB 9.2* 8.6* 7.7* 8.4* 10.0*  HCT 30.3* 29.0* 26.0* 28.8* 34.7*  MCV 91.0 90.6 92.9  --  93.0  PLT 263 253 267  --  404*   Cardiac  Enzymes: No results for input(s): CKTOTAL, CKMB, CKMBINDEX, TROPONINI in the last 168 hours. BNP: BNP (last 3 results) No results for input(s): BNP in the last 8760 hours.  ProBNP (last 3 results) No results for input(s): PROBNP in the last 8760  hours.  CBG: Recent Labs  Lab 11/13/18 1705 11/13/18 2115 11/14/18 0013 11/14/18 0726 11/14/18 1118  GLUCAP 254* 203* 181* 155* 81       Signed:  Irine Seal MD.  [facility] 11/14/2018, 2:03 PM

## 2018-11-14 NOTE — Progress Notes (Signed)
Discharge instructions were given to the patient's caregiver,  Rolan Lipa, at the bedside. There were no immediate questions or concerns. The patient will be taken downstairs via wheelchair. The patient will be transported home by her caregiver mentioned above.

## 2018-11-16 LAB — CULTURE, BLOOD (ROUTINE X 2)
Culture: NO GROWTH
Culture: NO GROWTH
SPECIAL REQUESTS: ADEQUATE
Special Requests: ADEQUATE

## 2018-11-18 DIAGNOSIS — L97516 Non-pressure chronic ulcer of other part of right foot with bone involvement without evidence of necrosis: Secondary | ICD-10-CM | POA: Diagnosis not present

## 2018-11-18 DIAGNOSIS — M069 Rheumatoid arthritis, unspecified: Secondary | ICD-10-CM | POA: Diagnosis not present

## 2018-11-18 DIAGNOSIS — I10 Essential (primary) hypertension: Secondary | ICD-10-CM | POA: Diagnosis not present

## 2018-11-18 DIAGNOSIS — E1142 Type 2 diabetes mellitus with diabetic polyneuropathy: Secondary | ICD-10-CM | POA: Diagnosis not present

## 2018-11-18 DIAGNOSIS — E11621 Type 2 diabetes mellitus with foot ulcer: Secondary | ICD-10-CM | POA: Diagnosis not present

## 2018-11-18 DIAGNOSIS — E11622 Type 2 diabetes mellitus with other skin ulcer: Secondary | ICD-10-CM | POA: Diagnosis not present

## 2018-11-18 DIAGNOSIS — L97211 Non-pressure chronic ulcer of right calf limited to breakdown of skin: Secondary | ICD-10-CM | POA: Diagnosis not present

## 2018-11-18 DIAGNOSIS — M2041 Other hammer toe(s) (acquired), right foot: Secondary | ICD-10-CM | POA: Diagnosis not present

## 2018-11-18 DIAGNOSIS — S81811A Laceration without foreign body, right lower leg, initial encounter: Secondary | ICD-10-CM | POA: Diagnosis not present

## 2018-11-19 DIAGNOSIS — Z993 Dependence on wheelchair: Secondary | ICD-10-CM | POA: Diagnosis not present

## 2018-11-19 DIAGNOSIS — F0390 Unspecified dementia without behavioral disturbance: Secondary | ICD-10-CM | POA: Diagnosis not present

## 2018-11-19 DIAGNOSIS — N390 Urinary tract infection, site not specified: Secondary | ICD-10-CM | POA: Diagnosis not present

## 2018-11-19 DIAGNOSIS — Z681 Body mass index (BMI) 19 or less, adult: Secondary | ICD-10-CM | POA: Diagnosis not present

## 2018-11-19 DIAGNOSIS — M05841 Other rheumatoid arthritis with rheumatoid factor of right hand: Secondary | ICD-10-CM | POA: Diagnosis not present

## 2018-11-19 DIAGNOSIS — B9620 Unspecified Escherichia coli [E. coli] as the cause of diseases classified elsewhere: Secondary | ICD-10-CM | POA: Diagnosis not present

## 2018-11-19 DIAGNOSIS — D509 Iron deficiency anemia, unspecified: Secondary | ICD-10-CM | POA: Diagnosis not present

## 2018-11-19 DIAGNOSIS — S81811D Laceration without foreign body, right lower leg, subsequent encounter: Secondary | ICD-10-CM | POA: Diagnosis not present

## 2018-11-19 DIAGNOSIS — E039 Hypothyroidism, unspecified: Secondary | ICD-10-CM | POA: Diagnosis not present

## 2018-11-19 DIAGNOSIS — E43 Unspecified severe protein-calorie malnutrition: Secondary | ICD-10-CM | POA: Diagnosis not present

## 2018-11-19 DIAGNOSIS — L97519 Non-pressure chronic ulcer of other part of right foot with unspecified severity: Secondary | ICD-10-CM | POA: Diagnosis not present

## 2018-11-19 DIAGNOSIS — E11621 Type 2 diabetes mellitus with foot ulcer: Secondary | ICD-10-CM | POA: Diagnosis not present

## 2018-11-19 DIAGNOSIS — M05842 Other rheumatoid arthritis with rheumatoid factor of left hand: Secondary | ICD-10-CM | POA: Diagnosis not present

## 2018-11-19 DIAGNOSIS — Z4800 Encounter for change or removal of nonsurgical wound dressing: Secondary | ICD-10-CM | POA: Diagnosis not present

## 2018-11-19 DIAGNOSIS — G9341 Metabolic encephalopathy: Secondary | ICD-10-CM | POA: Diagnosis not present

## 2018-11-19 DIAGNOSIS — I482 Chronic atrial fibrillation, unspecified: Secondary | ICD-10-CM | POA: Diagnosis not present

## 2018-11-19 DIAGNOSIS — Z7984 Long term (current) use of oral hypoglycemic drugs: Secondary | ICD-10-CM | POA: Diagnosis not present

## 2018-11-25 DIAGNOSIS — G9341 Metabolic encephalopathy: Secondary | ICD-10-CM | POA: Diagnosis not present

## 2018-11-25 DIAGNOSIS — B9620 Unspecified Escherichia coli [E. coli] as the cause of diseases classified elsewhere: Secondary | ICD-10-CM | POA: Diagnosis not present

## 2018-11-25 DIAGNOSIS — S81811D Laceration without foreign body, right lower leg, subsequent encounter: Secondary | ICD-10-CM | POA: Diagnosis not present

## 2018-11-25 DIAGNOSIS — N390 Urinary tract infection, site not specified: Secondary | ICD-10-CM | POA: Diagnosis not present

## 2018-11-25 DIAGNOSIS — E11621 Type 2 diabetes mellitus with foot ulcer: Secondary | ICD-10-CM | POA: Diagnosis not present

## 2018-11-25 DIAGNOSIS — L97519 Non-pressure chronic ulcer of other part of right foot with unspecified severity: Secondary | ICD-10-CM | POA: Diagnosis not present

## 2018-11-27 DIAGNOSIS — S81811D Laceration without foreign body, right lower leg, subsequent encounter: Secondary | ICD-10-CM | POA: Diagnosis not present

## 2018-11-27 DIAGNOSIS — N390 Urinary tract infection, site not specified: Secondary | ICD-10-CM | POA: Diagnosis not present

## 2018-11-27 DIAGNOSIS — L97519 Non-pressure chronic ulcer of other part of right foot with unspecified severity: Secondary | ICD-10-CM | POA: Diagnosis not present

## 2018-11-27 DIAGNOSIS — B9620 Unspecified Escherichia coli [E. coli] as the cause of diseases classified elsewhere: Secondary | ICD-10-CM | POA: Diagnosis not present

## 2018-11-27 DIAGNOSIS — E11621 Type 2 diabetes mellitus with foot ulcer: Secondary | ICD-10-CM | POA: Diagnosis not present

## 2018-11-27 DIAGNOSIS — G9341 Metabolic encephalopathy: Secondary | ICD-10-CM | POA: Diagnosis not present

## 2018-11-28 DIAGNOSIS — B9620 Unspecified Escherichia coli [E. coli] as the cause of diseases classified elsewhere: Secondary | ICD-10-CM | POA: Diagnosis not present

## 2018-11-28 DIAGNOSIS — L97519 Non-pressure chronic ulcer of other part of right foot with unspecified severity: Secondary | ICD-10-CM | POA: Diagnosis not present

## 2018-11-28 DIAGNOSIS — E11621 Type 2 diabetes mellitus with foot ulcer: Secondary | ICD-10-CM | POA: Diagnosis not present

## 2018-11-28 DIAGNOSIS — N390 Urinary tract infection, site not specified: Secondary | ICD-10-CM | POA: Diagnosis not present

## 2018-11-28 DIAGNOSIS — G9341 Metabolic encephalopathy: Secondary | ICD-10-CM | POA: Diagnosis not present

## 2018-11-28 DIAGNOSIS — S81811D Laceration without foreign body, right lower leg, subsequent encounter: Secondary | ICD-10-CM | POA: Diagnosis not present

## 2018-12-02 ENCOUNTER — Encounter (HOSPITAL_BASED_OUTPATIENT_CLINIC_OR_DEPARTMENT_OTHER): Payer: Medicare Other | Attending: Internal Medicine

## 2018-12-02 DIAGNOSIS — S81811D Laceration without foreign body, right lower leg, subsequent encounter: Secondary | ICD-10-CM | POA: Diagnosis not present

## 2018-12-02 DIAGNOSIS — N390 Urinary tract infection, site not specified: Secondary | ICD-10-CM | POA: Diagnosis not present

## 2018-12-02 DIAGNOSIS — S81801A Unspecified open wound, right lower leg, initial encounter: Secondary | ICD-10-CM | POA: Diagnosis not present

## 2018-12-02 DIAGNOSIS — M21961 Unspecified acquired deformity of right lower leg: Secondary | ICD-10-CM | POA: Diagnosis not present

## 2018-12-02 DIAGNOSIS — E1142 Type 2 diabetes mellitus with diabetic polyneuropathy: Secondary | ICD-10-CM | POA: Diagnosis not present

## 2018-12-02 DIAGNOSIS — I10 Essential (primary) hypertension: Secondary | ICD-10-CM | POA: Insufficient documentation

## 2018-12-02 DIAGNOSIS — L97516 Non-pressure chronic ulcer of other part of right foot with bone involvement without evidence of necrosis: Secondary | ICD-10-CM | POA: Insufficient documentation

## 2018-12-02 DIAGNOSIS — S91104A Unspecified open wound of right lesser toe(s) without damage to nail, initial encounter: Secondary | ICD-10-CM | POA: Diagnosis not present

## 2018-12-02 DIAGNOSIS — E11621 Type 2 diabetes mellitus with foot ulcer: Secondary | ICD-10-CM | POA: Insufficient documentation

## 2018-12-02 DIAGNOSIS — L97519 Non-pressure chronic ulcer of other part of right foot with unspecified severity: Secondary | ICD-10-CM | POA: Diagnosis not present

## 2018-12-02 DIAGNOSIS — B9620 Unspecified Escherichia coli [E. coli] as the cause of diseases classified elsewhere: Secondary | ICD-10-CM | POA: Diagnosis not present

## 2018-12-02 DIAGNOSIS — G9341 Metabolic encephalopathy: Secondary | ICD-10-CM | POA: Diagnosis not present

## 2018-12-03 DIAGNOSIS — I10 Essential (primary) hypertension: Secondary | ICD-10-CM | POA: Diagnosis not present

## 2018-12-03 DIAGNOSIS — D509 Iron deficiency anemia, unspecified: Secondary | ICD-10-CM | POA: Diagnosis not present

## 2018-12-03 DIAGNOSIS — E7800 Pure hypercholesterolemia, unspecified: Secondary | ICD-10-CM | POA: Diagnosis not present

## 2018-12-03 DIAGNOSIS — E46 Unspecified protein-calorie malnutrition: Secondary | ICD-10-CM | POA: Diagnosis not present

## 2018-12-03 DIAGNOSIS — E1149 Type 2 diabetes mellitus with other diabetic neurological complication: Secondary | ICD-10-CM | POA: Diagnosis not present

## 2018-12-03 DIAGNOSIS — E039 Hypothyroidism, unspecified: Secondary | ICD-10-CM | POA: Diagnosis not present

## 2018-12-04 DIAGNOSIS — S81811D Laceration without foreign body, right lower leg, subsequent encounter: Secondary | ICD-10-CM | POA: Diagnosis not present

## 2018-12-04 DIAGNOSIS — E11621 Type 2 diabetes mellitus with foot ulcer: Secondary | ICD-10-CM | POA: Diagnosis not present

## 2018-12-04 DIAGNOSIS — B9620 Unspecified Escherichia coli [E. coli] as the cause of diseases classified elsewhere: Secondary | ICD-10-CM | POA: Diagnosis not present

## 2018-12-04 DIAGNOSIS — N390 Urinary tract infection, site not specified: Secondary | ICD-10-CM | POA: Diagnosis not present

## 2018-12-04 DIAGNOSIS — L97519 Non-pressure chronic ulcer of other part of right foot with unspecified severity: Secondary | ICD-10-CM | POA: Diagnosis not present

## 2018-12-04 DIAGNOSIS — G9341 Metabolic encephalopathy: Secondary | ICD-10-CM | POA: Diagnosis not present

## 2018-12-09 DIAGNOSIS — E11621 Type 2 diabetes mellitus with foot ulcer: Secondary | ICD-10-CM | POA: Diagnosis not present

## 2018-12-09 DIAGNOSIS — S81811D Laceration without foreign body, right lower leg, subsequent encounter: Secondary | ICD-10-CM | POA: Diagnosis not present

## 2018-12-09 DIAGNOSIS — G9341 Metabolic encephalopathy: Secondary | ICD-10-CM | POA: Diagnosis not present

## 2018-12-09 DIAGNOSIS — L97519 Non-pressure chronic ulcer of other part of right foot with unspecified severity: Secondary | ICD-10-CM | POA: Diagnosis not present

## 2018-12-09 DIAGNOSIS — N390 Urinary tract infection, site not specified: Secondary | ICD-10-CM | POA: Diagnosis not present

## 2018-12-09 DIAGNOSIS — B9620 Unspecified Escherichia coli [E. coli] as the cause of diseases classified elsewhere: Secondary | ICD-10-CM | POA: Diagnosis not present

## 2018-12-11 DIAGNOSIS — L97519 Non-pressure chronic ulcer of other part of right foot with unspecified severity: Secondary | ICD-10-CM | POA: Diagnosis not present

## 2018-12-11 DIAGNOSIS — S81811D Laceration without foreign body, right lower leg, subsequent encounter: Secondary | ICD-10-CM | POA: Diagnosis not present

## 2018-12-11 DIAGNOSIS — E11621 Type 2 diabetes mellitus with foot ulcer: Secondary | ICD-10-CM | POA: Diagnosis not present

## 2018-12-11 DIAGNOSIS — N390 Urinary tract infection, site not specified: Secondary | ICD-10-CM | POA: Diagnosis not present

## 2018-12-11 DIAGNOSIS — G9341 Metabolic encephalopathy: Secondary | ICD-10-CM | POA: Diagnosis not present

## 2018-12-11 DIAGNOSIS — B9620 Unspecified Escherichia coli [E. coli] as the cause of diseases classified elsewhere: Secondary | ICD-10-CM | POA: Diagnosis not present

## 2018-12-15 ENCOUNTER — Other Ambulatory Visit: Payer: Self-pay

## 2018-12-16 ENCOUNTER — Other Ambulatory Visit (HOSPITAL_COMMUNITY)
Admission: RE | Admit: 2018-12-16 | Discharge: 2018-12-16 | Disposition: A | Discharge status: Home / Self Care | Payer: Medicare Other | Source: Other Acute Inpatient Hospital | Attending: Internal Medicine | Admitting: Internal Medicine

## 2018-12-16 ENCOUNTER — Other Ambulatory Visit (HOSPITAL_BASED_OUTPATIENT_CLINIC_OR_DEPARTMENT_OTHER): Payer: Self-pay | Admitting: Internal Medicine

## 2018-12-16 DIAGNOSIS — L97514 Non-pressure chronic ulcer of other part of right foot with necrosis of bone: Secondary | ICD-10-CM | POA: Diagnosis not present

## 2018-12-16 DIAGNOSIS — E1142 Type 2 diabetes mellitus with diabetic polyneuropathy: Secondary | ICD-10-CM | POA: Diagnosis not present

## 2018-12-16 DIAGNOSIS — M868X7 Other osteomyelitis, ankle and foot: Secondary | ICD-10-CM | POA: Diagnosis not present

## 2018-12-16 DIAGNOSIS — I10 Essential (primary) hypertension: Secondary | ICD-10-CM | POA: Diagnosis not present

## 2018-12-16 DIAGNOSIS — E11621 Type 2 diabetes mellitus with foot ulcer: Secondary | ICD-10-CM | POA: Diagnosis not present

## 2018-12-16 DIAGNOSIS — L97516 Non-pressure chronic ulcer of other part of right foot with bone involvement without evidence of necrosis: Secondary | ICD-10-CM | POA: Diagnosis not present

## 2018-12-18 LAB — AEROBIC CULTURE W GRAM STAIN (SUPERFICIAL SPECIMEN)

## 2018-12-18 LAB — AEROBIC CULTURE  (SUPERFICIAL SPECIMEN)

## 2018-12-19 DIAGNOSIS — B9620 Unspecified Escherichia coli [E. coli] as the cause of diseases classified elsewhere: Secondary | ICD-10-CM | POA: Diagnosis not present

## 2018-12-19 DIAGNOSIS — M05842 Other rheumatoid arthritis with rheumatoid factor of left hand: Secondary | ICD-10-CM | POA: Diagnosis not present

## 2018-12-19 DIAGNOSIS — Z993 Dependence on wheelchair: Secondary | ICD-10-CM | POA: Diagnosis not present

## 2018-12-19 DIAGNOSIS — E11621 Type 2 diabetes mellitus with foot ulcer: Secondary | ICD-10-CM | POA: Diagnosis not present

## 2018-12-19 DIAGNOSIS — D509 Iron deficiency anemia, unspecified: Secondary | ICD-10-CM | POA: Diagnosis not present

## 2018-12-19 DIAGNOSIS — E039 Hypothyroidism, unspecified: Secondary | ICD-10-CM | POA: Diagnosis not present

## 2018-12-19 DIAGNOSIS — N390 Urinary tract infection, site not specified: Secondary | ICD-10-CM | POA: Diagnosis not present

## 2018-12-19 DIAGNOSIS — F0390 Unspecified dementia without behavioral disturbance: Secondary | ICD-10-CM | POA: Diagnosis not present

## 2018-12-19 DIAGNOSIS — Z4800 Encounter for change or removal of nonsurgical wound dressing: Secondary | ICD-10-CM | POA: Diagnosis not present

## 2018-12-19 DIAGNOSIS — S81811D Laceration without foreign body, right lower leg, subsequent encounter: Secondary | ICD-10-CM | POA: Diagnosis not present

## 2018-12-19 DIAGNOSIS — Z681 Body mass index (BMI) 19 or less, adult: Secondary | ICD-10-CM | POA: Diagnosis not present

## 2018-12-19 DIAGNOSIS — L97519 Non-pressure chronic ulcer of other part of right foot with unspecified severity: Secondary | ICD-10-CM | POA: Diagnosis not present

## 2018-12-19 DIAGNOSIS — I482 Chronic atrial fibrillation, unspecified: Secondary | ICD-10-CM | POA: Diagnosis not present

## 2018-12-19 DIAGNOSIS — G9341 Metabolic encephalopathy: Secondary | ICD-10-CM | POA: Diagnosis not present

## 2018-12-19 DIAGNOSIS — Z7984 Long term (current) use of oral hypoglycemic drugs: Secondary | ICD-10-CM | POA: Diagnosis not present

## 2018-12-19 DIAGNOSIS — M05841 Other rheumatoid arthritis with rheumatoid factor of right hand: Secondary | ICD-10-CM | POA: Diagnosis not present

## 2018-12-19 DIAGNOSIS — E43 Unspecified severe protein-calorie malnutrition: Secondary | ICD-10-CM | POA: Diagnosis not present

## 2018-12-22 ENCOUNTER — Emergency Department (HOSPITAL_COMMUNITY): Payer: Medicare Other

## 2018-12-22 ENCOUNTER — Other Ambulatory Visit: Payer: Self-pay

## 2018-12-22 ENCOUNTER — Emergency Department (HOSPITAL_COMMUNITY)
Admission: EM | Admit: 2018-12-22 | Discharge: 2018-12-22 | Disposition: A | Discharge status: Home / Self Care | Payer: Medicare Other | Attending: Emergency Medicine | Admitting: Emergency Medicine

## 2018-12-22 ENCOUNTER — Encounter (HOSPITAL_COMMUNITY): Payer: Self-pay

## 2018-12-22 DIAGNOSIS — Z8673 Personal history of transient ischemic attack (TIA), and cerebral infarction without residual deficits: Secondary | ICD-10-CM | POA: Insufficient documentation

## 2018-12-22 DIAGNOSIS — E039 Hypothyroidism, unspecified: Secondary | ICD-10-CM | POA: Diagnosis not present

## 2018-12-22 DIAGNOSIS — N390 Urinary tract infection, site not specified: Secondary | ICD-10-CM | POA: Diagnosis not present

## 2018-12-22 DIAGNOSIS — Z79899 Other long term (current) drug therapy: Secondary | ICD-10-CM | POA: Insufficient documentation

## 2018-12-22 DIAGNOSIS — I10 Essential (primary) hypertension: Secondary | ICD-10-CM | POA: Insufficient documentation

## 2018-12-22 DIAGNOSIS — E119 Type 2 diabetes mellitus without complications: Secondary | ICD-10-CM | POA: Insufficient documentation

## 2018-12-22 DIAGNOSIS — B9620 Unspecified Escherichia coli [E. coli] as the cause of diseases classified elsewhere: Secondary | ICD-10-CM | POA: Diagnosis not present

## 2018-12-22 DIAGNOSIS — E11621 Type 2 diabetes mellitus with foot ulcer: Secondary | ICD-10-CM | POA: Diagnosis not present

## 2018-12-22 DIAGNOSIS — R0789 Other chest pain: Secondary | ICD-10-CM | POA: Diagnosis not present

## 2018-12-22 DIAGNOSIS — R0781 Pleurodynia: Secondary | ICD-10-CM | POA: Diagnosis not present

## 2018-12-22 DIAGNOSIS — E1165 Type 2 diabetes mellitus with hyperglycemia: Secondary | ICD-10-CM | POA: Diagnosis not present

## 2018-12-22 DIAGNOSIS — F0390 Unspecified dementia without behavioral disturbance: Secondary | ICD-10-CM | POA: Diagnosis not present

## 2018-12-22 DIAGNOSIS — T17920A Food in respiratory tract, part unspecified causing asphyxiation, initial encounter: Secondary | ICD-10-CM | POA: Diagnosis not present

## 2018-12-22 DIAGNOSIS — Z7984 Long term (current) use of oral hypoglycemic drugs: Secondary | ICD-10-CM | POA: Diagnosis not present

## 2018-12-22 DIAGNOSIS — R52 Pain, unspecified: Secondary | ICD-10-CM | POA: Diagnosis not present

## 2018-12-22 DIAGNOSIS — S81811D Laceration without foreign body, right lower leg, subsequent encounter: Secondary | ICD-10-CM | POA: Diagnosis not present

## 2018-12-22 DIAGNOSIS — R079 Chest pain, unspecified: Secondary | ICD-10-CM | POA: Diagnosis not present

## 2018-12-22 DIAGNOSIS — G9341 Metabolic encephalopathy: Secondary | ICD-10-CM | POA: Diagnosis not present

## 2018-12-22 DIAGNOSIS — T17998A Other foreign object in respiratory tract, part unspecified causing other injury, initial encounter: Secondary | ICD-10-CM | POA: Diagnosis not present

## 2018-12-22 DIAGNOSIS — L97519 Non-pressure chronic ulcer of other part of right foot with unspecified severity: Secondary | ICD-10-CM | POA: Diagnosis not present

## 2018-12-22 MED ORDER — ACETAMINOPHEN 325 MG PO TABS
650.0000 mg | ORAL_TABLET | Freq: Once | ORAL | Status: AC
  Administered 2018-12-22: 650 mg via ORAL
  Filled 2018-12-22: qty 2

## 2018-12-22 NOTE — ED Notes (Signed)
Bed: OI71 Expected date:  Expected time:  Means of arrival:  Comments: 85 yo F/abd pain from food bolus

## 2018-12-22 NOTE — ED Notes (Signed)
Patient verbalized permission for RN to sign for discharge. Discharge instructions reviewed with patient and caregiver.

## 2018-12-22 NOTE — Discharge Instructions (Signed)
You can take Tylenol every 4-6 hours as needed for pain. There is a possibility of a missed rib fracture that we could not see on your chest xray, therefore use the incentive spirometer to keep your lungs healthy while you heal. Follow-up with your primary care provider. Report to the emergency department if you develop shortness of breath/difficulty breathing, or new or worsening symptoms.

## 2018-12-22 NOTE — ED Notes (Signed)
Caregiver [phone] Herma Carson.

## 2018-12-22 NOTE — ED Triage Notes (Signed)
Patient coming from home with complaints of rib pain after receiving the heimlich from fire because she was choking on a piece of bread. Fire was successful with getting the bread up, but report that they heard something crack when performing chest thrusts. Pain is present with movement. Patient does have history of dementia, but caregiver reports that patient is at baseline currently.

## 2018-12-22 NOTE — ED Provider Notes (Signed)
[facility] DEPT Provider Note   CSN: [id number] Arrival date & time: 12/22/18  2018    History   Chief Complaint Chief Complaint  Patient presents with  . Rib Pain    HPI [name] is a 85 y.o. female with past medical history of dementia, type 2 diabetes, atrial fibrillation, TIA, brought to the ED with rib pain after receiving the Heimlich maneuver.  Patient states she was eating her dinner quickly, and choked on a piece of bread.  Fire was able to successfully remove the bread via the Heimlich maneuver, however it is reported they heard "something crack."  Patient points to her right lower anterior ribs states they are painful.  She denies shortness of breath.  No interventions tried prior to arrival.  Per caregiver, patient is currently at her mental baseline.     The history is provided by the patient and the EMS personnel. The history is limited by the condition of the patient.    Past Medical History:  Diagnosis Date  . Acute bronchitis   . Anemia, unspecified   . Atrial fibrillation ([place])    not on AC due to falls  . Depressive disorder, not elsewhere classified   . Diverticulosis of colon (without mention of hemorrhage)   . Esophageal reflux   . Family history of malignant neoplasm of gastrointestinal tract   . Functional diarrhea   . Hiatal hernia   . History of blood transfusion    "once; related to diverticulitis" (11/29/2017)  . Other and unspecified hyperlipidemia   . Other chest pain   . Other specified cardiac dysrhythmias(427.89)   . Personal history of colonic polyps 07/17/1995   adenomatous polyps  . PONV (postoperative nausea and vomiting)   . Recurrent UTI (urinary tract infection)    "2-3 times in the last 1 1/110yr" (11/29/2017)  . Rheumatoid arthritis ([place])   . Tachycardia, unspecified   . Type II diabetes mellitus ([place])   . Unspecified adverse effect of unspecified drug, medicinal and biological substance   .  Unspecified essential hypertension   . Unspecified hypothyroidism     Patient Active Problem List   Diagnosis Date Noted  . Dementia without behavioral disturbance ([place]) 11/12/2018  . Urinary tract infection without hematuria   . GI bleed 08/08/2018  . Altered mental status, unspecified 06/04/2018  . Acute encephalopathy 06/03/2018  . Atrial fibrillation, chronic 06/03/2018  . Pneumonia 04/26/2018  . TIA (transient ischemic attack) 04/25/2018  . Aphasia   . Pressure injury of skin 02/26/2018  . Hyperkalemia 02/25/2018  . Altered mental status 02/25/2018  . Acute lower UTI 02/25/2018  . Iron deficiency anemia due to chronic blood loss 12/17/2017  . Protein-calorie malnutrition, severe 11/30/2017  . Syncope 11/29/2017  . Diabetic ulcer of left foot associated with type 2 diabetes mellitus ([place]) 08/11/2015  . Closed right hip fracture ([place]) 08/28/2012  . History of gastroesophageal reflux (GERD) 06/26/2011  . Lower GI bleeding 06/26/2011  . DEPRESSION 03/31/2009  . DIARRHEA, FUNCTIONAL 03/31/2009  . DIABETIC HYPOGLYCEMIA, TYPE II 11/03/2008  . ACUTE BRONCHITIS 08/23/2008  . AV NODAL REENTRY TACHYCARDIA 10/29/2007  . TACHYCARDIA 10/29/2007  . CHEST PAIN, ATYPICAL 10/25/2007  . ADVEF, DRUG/MEDICINAL/BIOLOGICAL SUBST NOS 07/16/2007  . Nonspecific (abnormal) findings on radiological and other examination of body structure 04/03/2007  . CHEST XRAY, ABNORMAL 04/03/2007  . Hypothyroidism (acquired) 03/06/2007  . Type 2 diabetes mellitus with other specified complication ([place]) 29/92/4268  . HYPERLIPIDEMIA 03/06/2007  . Essential hypertension  03/06/2007  . DIVERTICULOSIS, COLON 03/06/2007  . Rheumatoid arthritis involving both hands with positive rheumatoid factor ([place]) 03/06/2007    Past Surgical History:  Procedure Laterality Date  . ABDOMINAL HYSTERECTOMY     "partial"  . CATARACT EXTRACTION Bilateral   . FOREARM FRACTURE SURGERY Right 1940s?  . FRACTURE SURGERY    . ORIF  FEMUR FRACTURE  08/30/2012   Procedure: OPEN REDUCTION INTERNAL FIXATION (ORIF) DISTAL FEMUR FRACTURE;  Surgeon: Mauri Pole, MD;  Location: [facility] ORS;  Service: Orthopedics;  Laterality: Right;  proximal femur  . THYROIDECTOMY, PARTIAL       OB History   No obstetric history on file.      Home Medications    Prior to Admission medications   Medication Sig Start Date End Date Taking? Authorizing Provider  amLODipine (NORVASC) 2.5 MG tablet Take 1 tablet (2.5 mg total) by mouth daily. 11/14/18  Yes Eugenie Filler, MD  benzonatate (TESSALON) 200 MG capsule Take 200 mg by mouth 3 (three) times daily as needed for cough. 08/05/18  Yes [provider]  doxycycline (VIBRA-TABS) 100 MG tablet Take 100 mg by mouth 2 (two) times daily. x7 days 12/19/18  Yes [provider]  folic acid (FOLVITE) 1 MG tablet Take 1 mg by mouth daily.  07/16/18  Yes [provider]  iron polysaccharides (NIFEREX) 150 MG capsule Take 1 capsule (150 mg total) by mouth daily. 11/14/18  Yes Eugenie Filler, MD  leflunomide (ARAVA) 20 MG tablet Take 20 mg by mouth daily. 08/14/17  Yes [provider]  levothyroxine (SYNTHROID) 100 MCG tablet Take 1 tablet (100 mcg total) by mouth daily. 12/07/17 12/22/18 Yes Regalado, Belkys A, MD  magnesium oxide (MAG-OX) 400 (241.3 Mg) MG tablet Take 0.5 tablets (200 mg total) by mouth 2 (two) times daily. Patient taking differently: Take 400 mg by mouth daily.  12/07/17  Yes Regalado, Belkys A, MD  Melatonin 2.5 MG CAPS Take 1 capsule (2.5 mg total) by mouth at bedtime as needed (for sleep). Patient taking differently: Take 2.5 mg by mouth at bedtime.  12/07/17  Yes Regalado, Belkys A, MD  metFORMIN (GLUCOPHAGE) 500 MG tablet Take 500-1,000 mg by mouth 2 (two) times daily with a meal. 1000 mg in the am. 500 mg in evening.   Yes [provider]  mirtazapine (REMERON) 7.5 MG tablet Take 7.5 mg by mouth at bedtime. 08/05/18  Yes [provider]  Multiple Vitamins-Minerals (CENTRUM SILVER 50+WOMEN) TABS Take 1 tablet by mouth daily.   Yes [provider]  MYRBETRIQ 25 MG TB24 tablet Take 25 mg by mouth daily. 06/25/18  Yes [provider]  ondansetron (ZOFRAN) 4 MG tablet Take 1 tablet (4 mg total) by mouth every 6 (six) hours as needed for nausea. 08/14/18  Yes Emokpae, Courage, MD  pantoprazole (PROTONIX) 40 MG tablet Take 1 tablet (40 mg total) by mouth 2 (two) times daily. 08/14/18  Yes Roxan Hockey, MD  Pitavastatin Calcium (LIVALO) 2 MG TABS Take 2 mg by mouth every Monday, Wednesday, and Friday.   Yes [provider]  predniSONE (DELTASONE) 1 MG tablet Take 4 mg by mouth daily with breakfast.    Yes [provider]  TRADJENTA 5 MG TABS tablet Take 5 mg by mouth daily. 01/29/18  Yes [provider]  acetaminophen (TYLENOL) 325 MG tablet Take 2 tablets (650 mg total) by mouth every 6 (six) hours as needed for mild pain, fever or headache (or Fever >/=  101). Patient not taking: Reported on 12/22/2018 08/14/18   Roxan Hockey, MD  calcium carbonate (TUMS - DOSED IN MG ELEMENTAL CALCIUM) 500 MG chewable tablet Chew 1 tablet (200 mg of elemental calcium total) by mouth 2 (two) times daily. Patient not taking: Reported on 12/22/2018 08/14/18   Roxan Hockey, MD  collagenase (SANTYL) ointment Apply topically daily. Apply Santyl to right 2nd toe wound Q day, then cover with moist 2X2 and foam dressing.  (Change foam dressing Q 3 days or PRN soiling.) Patient not taking: Reported on 12/22/2018 08/15/18   Roxan Hockey, MD  feeding supplement, ENSURE ENLIVE, (ENSURE ENLIVE) LIQD Take 237 mLs by mouth 2 (two) times daily between meals. Patient not taking: Reported on 12/22/2018 02/26/18   Lavina Hamman, MD  senna (SENOKOT) 8.6 MG TABS tablet Take 1 tablet (8.6 mg total) by mouth at bedtime. Patient not taking: Reported on 12/22/2018 11/14/18   Eugenie Filler, MD    Family  History Family History  Problem Relation Age of Onset  . Colon cancer Maternal Grandmother     Social History Social History   Tobacco Use  . Smoking status: Never Smoker  . Smokeless tobacco: Never Used  Substance Use Topics  . Alcohol use: Not Currently    Alcohol/week: 1.0 standard drinks    Types: 1 Glasses of wine per week  . Drug use: No     Allergies   Metformin and related   Review of Systems Review of Systems  Unable to perform ROS: Dementia  Respiratory: Negative for shortness of breath.   Gastrointestinal: Negative for abdominal pain.  Musculoskeletal:       Chest wall pain     Physical Exam Updated Vital Signs BP 109/60 (BP Location: Left Arm)   Pulse 62   Temp 97.9 F (36.6 C) (Oral)   Resp 18   SpO2 93%   Physical Exam Vitals signs and nursing note reviewed.  Constitutional:      Appearance: She is well-developed.     Comments: Very thin appearing, no distress.  Appears to be resting comfortably  HENT:     Head: Normocephalic and atraumatic.  Eyes:     Conjunctiva/sclera: Conjunctivae normal.  Cardiovascular:     Rate and Rhythm: Normal rate and regular rhythm.  Pulmonary:     Effort: Pulmonary effort is normal. No respiratory distress.     Breath sounds: Normal breath sounds.  Chest:     Chest wall: Tenderness (There is tenderness to the right lower anterior ribs, no crepitus or obvious deformity.  Some tenderness to the left lower anterior ribs as well.) present.  Abdominal:     General: Bowel sounds are normal. There is no distension.     Palpations: Abdomen is soft.     Tenderness: There is no abdominal tenderness. There is no guarding or rebound.     Comments: No ecchymosis  Skin:    General: Skin is warm.  Neurological:     Mental Status: She is alert.  Psychiatric:        Mood and Affect: Mood normal.        Behavior: Behavior normal.      ED Treatments / Results  Labs (all labs ordered are listed, but only abnormal  results are displayed) Labs Reviewed - No data to display  EKG None  Radiology Dg Chest 2 View  Result Date: 12/22/2018 CLINICAL DATA:  Chest pain after Heimlich. EXAM: CHEST - 2 VIEW COMPARISON:  Chest radiograph November 11, 2018 FINDINGS: Cardiac silhouette is mildly enlarged and unchanged. Calcified aortic arch. Mediastinal silhouette is not suspicious. Mild bibasilar chronic interstitial changes out pleural effusion or focal consolidation. No pneumothorax. Old LEFT rib fractures. Surgical clips project LEFT neck. Thoracolumbar levoscoliosis. Osteopenia. IMPRESSION: 1. Mild cardiomegaly.  No acute pulmonary process. 2.  Aortic Atherosclerosis (ICD10-I70.0). Electronically Signed   By: Elon Alas M.D.   On: 12/22/2018 21:41    Procedures Procedures (including critical care time)  Medications Ordered in ED Medications  acetaminophen (TYLENOL) tablet 650 mg (650 mg Oral Given 12/22/18 2254)     Initial Impression / Assessment and Plan / ED Course  I have reviewed the triage vital signs and the nursing notes.  Pertinent labs & imaging results that were available during my care of the patient were reviewed by me and considered in my medical decision making (see chart for details).        Patient presenting with rib pain after receiving the Heimlich maneuver from choking on a piece of bread prior to arrival.  Patient denies shortness of breath.  She is at her mental baseline, per caregiver.  No respiratory distress.  Vital signs are stable.  Lungs are clear bilaterally.  Chest x-ray is negative.  Will treat for possible occult fracture with incentive spirometer and strict return precautions.  Safe for discharge at this time.  Patient discussed with and evaluated by Dr. Sherry Ruffing, who agrees with care plan.   Final Clinical Impressions(s) / ED Diagnoses   Final diagnoses:  Chest wall pain    ED Discharge Orders    None       Tazia Illescas, [place] N, PA-C 12/22/18 2301     Tegeler, Gwenyth Allegra, MD 12/23/18 0003

## 2018-12-23 DIAGNOSIS — E11621 Type 2 diabetes mellitus with foot ulcer: Secondary | ICD-10-CM | POA: Diagnosis not present

## 2018-12-23 DIAGNOSIS — B9620 Unspecified Escherichia coli [E. coli] as the cause of diseases classified elsewhere: Secondary | ICD-10-CM | POA: Diagnosis not present

## 2018-12-23 DIAGNOSIS — S81811D Laceration without foreign body, right lower leg, subsequent encounter: Secondary | ICD-10-CM | POA: Diagnosis not present

## 2018-12-23 DIAGNOSIS — G9341 Metabolic encephalopathy: Secondary | ICD-10-CM | POA: Diagnosis not present

## 2018-12-23 DIAGNOSIS — N390 Urinary tract infection, site not specified: Secondary | ICD-10-CM | POA: Diagnosis not present

## 2018-12-23 DIAGNOSIS — L97519 Non-pressure chronic ulcer of other part of right foot with unspecified severity: Secondary | ICD-10-CM | POA: Diagnosis not present

## 2018-12-24 ENCOUNTER — Encounter (HOSPITAL_BASED_OUTPATIENT_CLINIC_OR_DEPARTMENT_OTHER): Payer: Medicare Other | Attending: Physician Assistant

## 2018-12-24 ENCOUNTER — Other Ambulatory Visit: Payer: Self-pay

## 2018-12-24 DIAGNOSIS — E1142 Type 2 diabetes mellitus with diabetic polyneuropathy: Secondary | ICD-10-CM | POA: Diagnosis not present

## 2018-12-24 DIAGNOSIS — Z7984 Long term (current) use of oral hypoglycemic drugs: Secondary | ICD-10-CM | POA: Insufficient documentation

## 2018-12-24 DIAGNOSIS — S81811D Laceration without foreign body, right lower leg, subsequent encounter: Secondary | ICD-10-CM | POA: Diagnosis not present

## 2018-12-24 DIAGNOSIS — E11621 Type 2 diabetes mellitus with foot ulcer: Secondary | ICD-10-CM | POA: Insufficient documentation

## 2018-12-24 DIAGNOSIS — M869 Osteomyelitis, unspecified: Secondary | ICD-10-CM | POA: Diagnosis not present

## 2018-12-24 DIAGNOSIS — B9562 Methicillin resistant Staphylococcus aureus infection as the cause of diseases classified elsewhere: Secondary | ICD-10-CM | POA: Insufficient documentation

## 2018-12-24 DIAGNOSIS — E1169 Type 2 diabetes mellitus with other specified complication: Secondary | ICD-10-CM | POA: Insufficient documentation

## 2018-12-24 DIAGNOSIS — G9341 Metabolic encephalopathy: Secondary | ICD-10-CM | POA: Diagnosis not present

## 2018-12-24 DIAGNOSIS — I10 Essential (primary) hypertension: Secondary | ICD-10-CM | POA: Diagnosis not present

## 2018-12-24 DIAGNOSIS — L97516 Non-pressure chronic ulcer of other part of right foot with bone involvement without evidence of necrosis: Secondary | ICD-10-CM | POA: Diagnosis not present

## 2018-12-24 DIAGNOSIS — Z8500 Personal history of malignant neoplasm of unspecified digestive organ: Secondary | ICD-10-CM | POA: Insufficient documentation

## 2018-12-24 DIAGNOSIS — B9620 Unspecified Escherichia coli [E. coli] as the cause of diseases classified elsewhere: Secondary | ICD-10-CM | POA: Diagnosis not present

## 2018-12-24 DIAGNOSIS — L97519 Non-pressure chronic ulcer of other part of right foot with unspecified severity: Secondary | ICD-10-CM | POA: Diagnosis not present

## 2018-12-24 DIAGNOSIS — N390 Urinary tract infection, site not specified: Secondary | ICD-10-CM | POA: Diagnosis not present

## 2018-12-25 DIAGNOSIS — G9341 Metabolic encephalopathy: Secondary | ICD-10-CM | POA: Diagnosis not present

## 2018-12-25 DIAGNOSIS — B9620 Unspecified Escherichia coli [E. coli] as the cause of diseases classified elsewhere: Secondary | ICD-10-CM | POA: Diagnosis not present

## 2018-12-25 DIAGNOSIS — L97519 Non-pressure chronic ulcer of other part of right foot with unspecified severity: Secondary | ICD-10-CM | POA: Diagnosis not present

## 2018-12-25 DIAGNOSIS — N390 Urinary tract infection, site not specified: Secondary | ICD-10-CM | POA: Diagnosis not present

## 2018-12-25 DIAGNOSIS — S81811D Laceration without foreign body, right lower leg, subsequent encounter: Secondary | ICD-10-CM | POA: Diagnosis not present

## 2018-12-25 DIAGNOSIS — E11621 Type 2 diabetes mellitus with foot ulcer: Secondary | ICD-10-CM | POA: Diagnosis not present

## 2018-12-29 DIAGNOSIS — L97519 Non-pressure chronic ulcer of other part of right foot with unspecified severity: Secondary | ICD-10-CM | POA: Diagnosis not present

## 2018-12-29 DIAGNOSIS — B9620 Unspecified Escherichia coli [E. coli] as the cause of diseases classified elsewhere: Secondary | ICD-10-CM | POA: Diagnosis not present

## 2018-12-29 DIAGNOSIS — N390 Urinary tract infection, site not specified: Secondary | ICD-10-CM | POA: Diagnosis not present

## 2018-12-29 DIAGNOSIS — E11621 Type 2 diabetes mellitus with foot ulcer: Secondary | ICD-10-CM | POA: Diagnosis not present

## 2018-12-29 DIAGNOSIS — S81811D Laceration without foreign body, right lower leg, subsequent encounter: Secondary | ICD-10-CM | POA: Diagnosis not present

## 2018-12-29 DIAGNOSIS — G9341 Metabolic encephalopathy: Secondary | ICD-10-CM | POA: Diagnosis not present

## 2018-12-30 DIAGNOSIS — B9620 Unspecified Escherichia coli [E. coli] as the cause of diseases classified elsewhere: Secondary | ICD-10-CM | POA: Diagnosis not present

## 2018-12-30 DIAGNOSIS — G9341 Metabolic encephalopathy: Secondary | ICD-10-CM | POA: Diagnosis not present

## 2018-12-30 DIAGNOSIS — S81811D Laceration without foreign body, right lower leg, subsequent encounter: Secondary | ICD-10-CM | POA: Diagnosis not present

## 2018-12-30 DIAGNOSIS — L97519 Non-pressure chronic ulcer of other part of right foot with unspecified severity: Secondary | ICD-10-CM | POA: Diagnosis not present

## 2018-12-30 DIAGNOSIS — E11621 Type 2 diabetes mellitus with foot ulcer: Secondary | ICD-10-CM | POA: Diagnosis not present

## 2018-12-30 DIAGNOSIS — N390 Urinary tract infection, site not specified: Secondary | ICD-10-CM | POA: Diagnosis not present

## 2019-01-01 DIAGNOSIS — B9620 Unspecified Escherichia coli [E. coli] as the cause of diseases classified elsewhere: Secondary | ICD-10-CM | POA: Diagnosis not present

## 2019-01-01 DIAGNOSIS — N390 Urinary tract infection, site not specified: Secondary | ICD-10-CM | POA: Diagnosis not present

## 2019-01-01 DIAGNOSIS — L97519 Non-pressure chronic ulcer of other part of right foot with unspecified severity: Secondary | ICD-10-CM | POA: Diagnosis not present

## 2019-01-01 DIAGNOSIS — E11621 Type 2 diabetes mellitus with foot ulcer: Secondary | ICD-10-CM | POA: Diagnosis not present

## 2019-01-01 DIAGNOSIS — G9341 Metabolic encephalopathy: Secondary | ICD-10-CM | POA: Diagnosis not present

## 2019-01-01 DIAGNOSIS — S81811D Laceration without foreign body, right lower leg, subsequent encounter: Secondary | ICD-10-CM | POA: Diagnosis not present

## 2019-01-02 DIAGNOSIS — L97516 Non-pressure chronic ulcer of other part of right foot with bone involvement without evidence of necrosis: Secondary | ICD-10-CM | POA: Diagnosis not present

## 2019-01-02 DIAGNOSIS — B9562 Methicillin resistant Staphylococcus aureus infection as the cause of diseases classified elsewhere: Secondary | ICD-10-CM | POA: Diagnosis not present

## 2019-01-02 DIAGNOSIS — M869 Osteomyelitis, unspecified: Secondary | ICD-10-CM | POA: Diagnosis not present

## 2019-01-02 DIAGNOSIS — E11621 Type 2 diabetes mellitus with foot ulcer: Secondary | ICD-10-CM | POA: Diagnosis not present

## 2019-01-02 DIAGNOSIS — E1142 Type 2 diabetes mellitus with diabetic polyneuropathy: Secondary | ICD-10-CM | POA: Diagnosis not present

## 2019-01-02 DIAGNOSIS — E1169 Type 2 diabetes mellitus with other specified complication: Secondary | ICD-10-CM | POA: Diagnosis not present

## 2019-01-06 DIAGNOSIS — S81811D Laceration without foreign body, right lower leg, subsequent encounter: Secondary | ICD-10-CM | POA: Diagnosis not present

## 2019-01-06 DIAGNOSIS — G9341 Metabolic encephalopathy: Secondary | ICD-10-CM | POA: Diagnosis not present

## 2019-01-06 DIAGNOSIS — E11621 Type 2 diabetes mellitus with foot ulcer: Secondary | ICD-10-CM | POA: Diagnosis not present

## 2019-01-06 DIAGNOSIS — L97519 Non-pressure chronic ulcer of other part of right foot with unspecified severity: Secondary | ICD-10-CM | POA: Diagnosis not present

## 2019-01-06 DIAGNOSIS — N390 Urinary tract infection, site not specified: Secondary | ICD-10-CM | POA: Diagnosis not present

## 2019-01-06 DIAGNOSIS — B9620 Unspecified Escherichia coli [E. coli] as the cause of diseases classified elsewhere: Secondary | ICD-10-CM | POA: Diagnosis not present

## 2019-01-08 DIAGNOSIS — B9620 Unspecified Escherichia coli [E. coli] as the cause of diseases classified elsewhere: Secondary | ICD-10-CM | POA: Diagnosis not present

## 2019-01-08 DIAGNOSIS — S81811D Laceration without foreign body, right lower leg, subsequent encounter: Secondary | ICD-10-CM | POA: Diagnosis not present

## 2019-01-08 DIAGNOSIS — G9341 Metabolic encephalopathy: Secondary | ICD-10-CM | POA: Diagnosis not present

## 2019-01-08 DIAGNOSIS — L97519 Non-pressure chronic ulcer of other part of right foot with unspecified severity: Secondary | ICD-10-CM | POA: Diagnosis not present

## 2019-01-08 DIAGNOSIS — N390 Urinary tract infection, site not specified: Secondary | ICD-10-CM | POA: Diagnosis not present

## 2019-01-08 DIAGNOSIS — E11621 Type 2 diabetes mellitus with foot ulcer: Secondary | ICD-10-CM | POA: Diagnosis not present

## 2019-01-13 DIAGNOSIS — B9620 Unspecified Escherichia coli [E. coli] as the cause of diseases classified elsewhere: Secondary | ICD-10-CM | POA: Diagnosis not present

## 2019-01-13 DIAGNOSIS — E11621 Type 2 diabetes mellitus with foot ulcer: Secondary | ICD-10-CM | POA: Diagnosis not present

## 2019-01-13 DIAGNOSIS — N390 Urinary tract infection, site not specified: Secondary | ICD-10-CM | POA: Diagnosis not present

## 2019-01-13 DIAGNOSIS — S81811D Laceration without foreign body, right lower leg, subsequent encounter: Secondary | ICD-10-CM | POA: Diagnosis not present

## 2019-01-13 DIAGNOSIS — L97519 Non-pressure chronic ulcer of other part of right foot with unspecified severity: Secondary | ICD-10-CM | POA: Diagnosis not present

## 2019-01-13 DIAGNOSIS — G9341 Metabolic encephalopathy: Secondary | ICD-10-CM | POA: Diagnosis not present

## 2019-01-15 DIAGNOSIS — S81811D Laceration without foreign body, right lower leg, subsequent encounter: Secondary | ICD-10-CM | POA: Diagnosis not present

## 2019-01-15 DIAGNOSIS — E11621 Type 2 diabetes mellitus with foot ulcer: Secondary | ICD-10-CM | POA: Diagnosis not present

## 2019-01-15 DIAGNOSIS — G9341 Metabolic encephalopathy: Secondary | ICD-10-CM | POA: Diagnosis not present

## 2019-01-15 DIAGNOSIS — B9620 Unspecified Escherichia coli [E. coli] as the cause of diseases classified elsewhere: Secondary | ICD-10-CM | POA: Diagnosis not present

## 2019-01-15 DIAGNOSIS — L97519 Non-pressure chronic ulcer of other part of right foot with unspecified severity: Secondary | ICD-10-CM | POA: Diagnosis not present

## 2019-01-15 DIAGNOSIS — N390 Urinary tract infection, site not specified: Secondary | ICD-10-CM | POA: Diagnosis not present

## 2019-01-16 DIAGNOSIS — B9562 Methicillin resistant Staphylococcus aureus infection as the cause of diseases classified elsewhere: Secondary | ICD-10-CM | POA: Diagnosis not present

## 2019-01-16 DIAGNOSIS — E1169 Type 2 diabetes mellitus with other specified complication: Secondary | ICD-10-CM | POA: Diagnosis not present

## 2019-01-16 DIAGNOSIS — M869 Osteomyelitis, unspecified: Secondary | ICD-10-CM | POA: Diagnosis not present

## 2019-01-16 DIAGNOSIS — E1142 Type 2 diabetes mellitus with diabetic polyneuropathy: Secondary | ICD-10-CM | POA: Diagnosis not present

## 2019-01-16 DIAGNOSIS — E11621 Type 2 diabetes mellitus with foot ulcer: Secondary | ICD-10-CM | POA: Diagnosis not present

## 2019-01-16 DIAGNOSIS — S91104A Unspecified open wound of right lesser toe(s) without damage to nail, initial encounter: Secondary | ICD-10-CM | POA: Diagnosis not present

## 2019-01-16 DIAGNOSIS — M86671 Other chronic osteomyelitis, right ankle and foot: Secondary | ICD-10-CM | POA: Diagnosis not present

## 2019-01-16 DIAGNOSIS — S81801A Unspecified open wound, right lower leg, initial encounter: Secondary | ICD-10-CM | POA: Diagnosis not present

## 2019-01-16 DIAGNOSIS — L97516 Non-pressure chronic ulcer of other part of right foot with bone involvement without evidence of necrosis: Secondary | ICD-10-CM | POA: Diagnosis not present

## 2019-01-19 ENCOUNTER — Inpatient Hospital Stay (HOSPITAL_COMMUNITY)
Admission: EM | Admit: 2019-01-19 | Discharge: 2019-01-23 | DRG: 071 | Disposition: A | Discharge status: Hospice | Payer: Medicare Other | Attending: Family Medicine | Admitting: Family Medicine

## 2019-01-19 ENCOUNTER — Emergency Department (HOSPITAL_COMMUNITY): Payer: Medicare Other

## 2019-01-19 ENCOUNTER — Encounter (HOSPITAL_COMMUNITY): Payer: Self-pay | Admitting: Emergency Medicine

## 2019-01-19 ENCOUNTER — Other Ambulatory Visit: Payer: Self-pay

## 2019-01-19 DIAGNOSIS — E860 Dehydration: Secondary | ICD-10-CM | POA: Diagnosis present

## 2019-01-19 DIAGNOSIS — R443 Hallucinations, unspecified: Secondary | ICD-10-CM | POA: Diagnosis not present

## 2019-01-19 DIAGNOSIS — E119 Type 2 diabetes mellitus without complications: Secondary | ICD-10-CM | POA: Diagnosis present

## 2019-01-19 DIAGNOSIS — Z1159 Encounter for screening for other viral diseases: Secondary | ICD-10-CM | POA: Diagnosis not present

## 2019-01-19 DIAGNOSIS — M05741 Rheumatoid arthritis with rheumatoid factor of right hand without organ or systems involvement: Secondary | ICD-10-CM | POA: Diagnosis present

## 2019-01-19 DIAGNOSIS — E1169 Type 2 diabetes mellitus with other specified complication: Secondary | ICD-10-CM | POA: Diagnosis present

## 2019-01-19 DIAGNOSIS — M05742 Rheumatoid arthritis with rheumatoid factor of left hand without organ or systems involvement: Secondary | ICD-10-CM

## 2019-01-19 DIAGNOSIS — Z8719 Personal history of other diseases of the digestive system: Secondary | ICD-10-CM | POA: Diagnosis not present

## 2019-01-19 DIAGNOSIS — E890 Postprocedural hypothyroidism: Secondary | ICD-10-CM | POA: Diagnosis present

## 2019-01-19 DIAGNOSIS — N390 Urinary tract infection, site not specified: Secondary | ICD-10-CM

## 2019-01-19 DIAGNOSIS — L8990 Pressure ulcer of unspecified site, unspecified stage: Secondary | ICD-10-CM | POA: Diagnosis present

## 2019-01-19 DIAGNOSIS — D500 Iron deficiency anemia secondary to blood loss (chronic): Secondary | ICD-10-CM | POA: Diagnosis present

## 2019-01-19 DIAGNOSIS — F0390 Unspecified dementia without behavioral disturbance: Secondary | ICD-10-CM

## 2019-01-19 DIAGNOSIS — R64 Cachexia: Secondary | ICD-10-CM | POA: Diagnosis present

## 2019-01-19 DIAGNOSIS — Z66 Do not resuscitate: Secondary | ICD-10-CM | POA: Diagnosis present

## 2019-01-19 DIAGNOSIS — Z79899 Other long term (current) drug therapy: Secondary | ICD-10-CM

## 2019-01-19 DIAGNOSIS — R29810 Facial weakness: Secondary | ICD-10-CM | POA: Diagnosis present

## 2019-01-19 DIAGNOSIS — Z681 Body mass index (BMI) 19 or less, adult: Secondary | ICD-10-CM

## 2019-01-19 DIAGNOSIS — Z79890 Hormone replacement therapy (postmenopausal): Secondary | ICD-10-CM

## 2019-01-19 DIAGNOSIS — R296 Repeated falls: Secondary | ICD-10-CM | POA: Diagnosis present

## 2019-01-19 DIAGNOSIS — E039 Hypothyroidism, unspecified: Secondary | ICD-10-CM

## 2019-01-19 DIAGNOSIS — Z9049 Acquired absence of other specified parts of digestive tract: Secondary | ICD-10-CM

## 2019-01-19 DIAGNOSIS — G9341 Metabolic encephalopathy: Principal | ICD-10-CM | POA: Diagnosis present

## 2019-01-19 DIAGNOSIS — Z7952 Long term (current) use of systemic steroids: Secondary | ICD-10-CM

## 2019-01-19 DIAGNOSIS — D509 Iron deficiency anemia, unspecified: Secondary | ICD-10-CM | POA: Diagnosis present

## 2019-01-19 DIAGNOSIS — E785 Hyperlipidemia, unspecified: Secondary | ICD-10-CM | POA: Diagnosis present

## 2019-01-19 DIAGNOSIS — G9340 Encephalopathy, unspecified: Secondary | ICD-10-CM

## 2019-01-19 DIAGNOSIS — I482 Chronic atrial fibrillation, unspecified: Secondary | ICD-10-CM | POA: Diagnosis not present

## 2019-01-19 DIAGNOSIS — I10 Essential (primary) hypertension: Secondary | ICD-10-CM | POA: Diagnosis not present

## 2019-01-19 DIAGNOSIS — Z20828 Contact with and (suspected) exposure to other viral communicable diseases: Secondary | ICD-10-CM

## 2019-01-19 DIAGNOSIS — R69 Illness, unspecified: Secondary | ICD-10-CM

## 2019-01-19 DIAGNOSIS — M069 Rheumatoid arthritis, unspecified: Secondary | ICD-10-CM | POA: Diagnosis present

## 2019-01-19 DIAGNOSIS — Z515 Encounter for palliative care: Secondary | ICD-10-CM

## 2019-01-19 DIAGNOSIS — R4182 Altered mental status, unspecified: Secondary | ICD-10-CM | POA: Diagnosis not present

## 2019-01-19 DIAGNOSIS — Z7189 Other specified counseling: Secondary | ICD-10-CM

## 2019-01-19 DIAGNOSIS — K219 Gastro-esophageal reflux disease without esophagitis: Secondary | ICD-10-CM | POA: Diagnosis present

## 2019-01-19 DIAGNOSIS — Z87440 Personal history of urinary (tract) infections: Secondary | ICD-10-CM

## 2019-01-19 DIAGNOSIS — Z888 Allergy status to other drugs, medicaments and biological substances status: Secondary | ICD-10-CM

## 2019-01-19 DIAGNOSIS — Z7984 Long term (current) use of oral hypoglycemic drugs: Secondary | ICD-10-CM

## 2019-01-19 DIAGNOSIS — Z8673 Personal history of transient ischemic attack (TIA), and cerebral infarction without residual deficits: Secondary | ICD-10-CM

## 2019-01-19 DIAGNOSIS — Z993 Dependence on wheelchair: Secondary | ICD-10-CM

## 2019-01-19 LAB — RAPID URINE DRUG SCREEN, HOSP PERFORMED
Amphetamines: NOT DETECTED
Barbiturates: NOT DETECTED
Benzodiazepines: NOT DETECTED
Cocaine: NOT DETECTED
Opiates: NOT DETECTED
Tetrahydrocannabinol: NOT DETECTED

## 2019-01-19 LAB — COMPREHENSIVE METABOLIC PANEL
ALT: 16 U/L (ref 0–44)
AST: 22 U/L (ref 15–41)
Albumin: 3.5 g/dL (ref 3.5–5.0)
Alkaline Phosphatase: 92 U/L (ref 38–126)
Anion gap: 13 (ref 5–15)
BUN: 19 mg/dL (ref 8–23)
CO2: 23 mmol/L (ref 22–32)
Calcium: 8.4 mg/dL — ABNORMAL LOW (ref 8.9–10.3)
Chloride: 106 mmol/L (ref 98–111)
Creatinine, Ser: 0.66 mg/dL (ref 0.44–1.00)
GFR calc Af Amer: >60 mL/min (ref >60)
GFR calc non Af Amer: >60 mL/min (ref >60)
Glucose, Bld: 137 mg/dL — ABNORMAL HIGH (ref 70–99)
Potassium: 3.9 mmol/L (ref 3.5–5.1)
Sodium: 142 mmol/L (ref 135–145)
Total Bilirubin: 0.8 mg/dL (ref 0.3–1.2)
Total Protein: 6.8 g/dL (ref 6.5–8.1)

## 2019-01-19 LAB — PROTIME-INR
INR: 1 (ref 0.8–1.2)
Prothrombin Time: 13.3 seconds (ref 11.4–15.2)

## 2019-01-19 LAB — SARS CORONAVIRUS 2 BY RT PCR (HOSPITAL ORDER, PERFORMED IN ~~LOC~~ HOSPITAL LAB): SARS Coronavirus 2: NEGATIVE

## 2019-01-19 LAB — URINALYSIS, ROUTINE W REFLEX MICROSCOPIC
Bacteria, UA: NONE SEEN
Bilirubin Urine: NEGATIVE
Glucose, UA: NEGATIVE mg/dL
Ketones, ur: 20 mg/dL — AB
Nitrite: NEGATIVE
Protein, ur: 100 mg/dL — AB
Specific Gravity, Urine: 1.017 (ref 1.005–1.030)
pH: 5 (ref 5.0–8.0)

## 2019-01-19 LAB — CBC WITH DIFFERENTIAL/PLATELET
Abs Immature Granulocytes: 0.03 10*3/uL (ref 0.00–0.07)
Basophils Absolute: 0.1 10*3/uL (ref 0.0–0.1)
Basophils Relative: 1 %
Eosinophils Absolute: 0.1 10*3/uL (ref 0.0–0.5)
Eosinophils Relative: 1 %
HCT: 45.6 % (ref 36.0–46.0)
Hemoglobin: 13.9 g/dL (ref 12.0–15.0)
Immature Granulocytes: 0 %
Lymphocytes Relative: 17 %
Lymphs Abs: 1.6 10*3/uL (ref 0.7–4.0)
MCH: 29.8 pg (ref 26.0–34.0)
MCHC: 30.5 g/dL (ref 30.0–36.0)
MCV: 97.6 fL (ref 80.0–100.0)
Monocytes Absolute: 0.8 10*3/uL (ref 0.1–1.0)
Monocytes Relative: 8 %
Neutro Abs: 6.8 10*3/uL (ref 1.7–7.7)
Neutrophils Relative %: 73 %
Platelets: 249 10*3/uL (ref 150–400)
RBC: 4.67 MIL/uL (ref 3.87–5.11)
RDW: 16.1 % — ABNORMAL HIGH (ref 11.5–15.5)
WBC: 9.5 10*3/uL (ref 4.0–10.5)
nRBC: 0 % (ref 0.0–0.2)

## 2019-01-19 LAB — LACTIC ACID, PLASMA
Lactic Acid, Venous: 1.5 mmol/L (ref 0.5–1.9)
Lactic Acid, Venous: 1.3 mmol/L (ref 0.5–1.9)

## 2019-01-19 LAB — GLUCOSE, CAPILLARY: Glucose-Capillary: 121 mg/dL — ABNORMAL HIGH (ref 70–99)

## 2019-01-19 LAB — CBG MONITORING, ED: Glucose-Capillary: 120 mg/dL — ABNORMAL HIGH (ref 70–99)

## 2019-01-19 LAB — APTT: aPTT: 25 seconds (ref 24–36)

## 2019-01-19 LAB — TROPONIN I: Troponin I: 0.04 ng/mL (ref <0.03)

## 2019-01-19 MED ORDER — SENNOSIDES-DOCUSATE SODIUM 8.6-50 MG PO TABS: 1.0000 | ORAL_TABLET | Freq: Every evening | ORAL | Status: DC | PRN

## 2019-01-19 MED ORDER — MIRABEGRON ER 25 MG PO TB24
25.0000 mg | ORAL_TABLET | Freq: Every day | ORAL | Status: DC
  Administered 2019-01-20 – 2019-01-23 (×4): 25 mg via ORAL
  Filled 2019-01-19 (×4): qty 1

## 2019-01-19 MED ORDER — ONDANSETRON HCL 4 MG/2ML IJ SOLN: 4.0000 mg | Freq: Four times a day (QID) | INTRAMUSCULAR | Status: DC | PRN

## 2019-01-19 MED ORDER — SODIUM CHLORIDE 0.9 % IV SOLN
INTRAVENOUS | Status: AC
  Administered 2019-01-19 – 2019-01-20 (×2): via INTRAVENOUS

## 2019-01-19 MED ORDER — SODIUM CHLORIDE 0.9 % IV SOLN
1.0000 g | Freq: Once | INTRAVENOUS | Status: AC
  Administered 2019-01-19: 23:00:00 1 g via INTRAVENOUS
  Filled 2019-01-19: qty 10

## 2019-01-19 MED ORDER — PREDNISONE 1 MG PO TABS
4.0000 mg | ORAL_TABLET | Freq: Every day | ORAL | Status: DC
  Administered 2019-01-20 – 2019-01-23 (×4): 4 mg via ORAL
  Filled 2019-01-19 (×5): qty 4

## 2019-01-19 MED ORDER — ACETAMINOPHEN 325 MG PO TABS
650.0000 mg | ORAL_TABLET | Freq: Four times a day (QID) | ORAL | Status: DC | PRN
  Administered 2019-01-19: 650 mg via ORAL
  Filled 2019-01-19: qty 2

## 2019-01-19 MED ORDER — INSULIN ASPART 100 UNIT/ML ~~LOC~~ SOLN
0.0000 [IU] | SUBCUTANEOUS | Status: DC
  Administered 2019-01-19 – 2019-01-23 (×3): 1 [IU] via SUBCUTANEOUS

## 2019-01-19 MED ORDER — DOCUSATE SODIUM 100 MG PO CAPS
100.0000 mg | ORAL_CAPSULE | Freq: Two times a day (BID) | ORAL | Status: DC
  Administered 2019-01-20 – 2019-01-22 (×4): 100 mg via ORAL
  Filled 2019-01-19 (×5): qty 1

## 2019-01-19 MED ORDER — AMLODIPINE BESYLATE 5 MG PO TABS
2.5000 mg | ORAL_TABLET | Freq: Every day | ORAL | Status: DC
  Administered 2019-01-19 – 2019-01-23 (×5): 2.5 mg via ORAL
  Filled 2019-01-19 (×5): qty 1

## 2019-01-19 MED ORDER — ACETAMINOPHEN 650 MG RE SUPP: 650.0000 mg | Freq: Four times a day (QID) | RECTAL | Status: DC | PRN

## 2019-01-19 MED ORDER — VANCOMYCIN HCL IN DEXTROSE 1-5 GM/200ML-% IV SOLN: 1000.0000 mg | INTRAVENOUS | Status: DC

## 2019-01-19 MED ORDER — MIRTAZAPINE 7.5 MG PO TABS
7.5000 mg | ORAL_TABLET | Freq: Every day | ORAL | Status: DC
  Administered 2019-01-19: 7.5 mg via ORAL
  Filled 2019-01-19: qty 1

## 2019-01-19 MED ORDER — ONDANSETRON HCL 4 MG PO TABS: 4.0000 mg | ORAL_TABLET | Freq: Four times a day (QID) | ORAL | Status: DC | PRN

## 2019-01-19 MED ORDER — SODIUM CHLORIDE 0.9 % IV SOLN
1.0000 g | INTRAVENOUS | Status: DC
  Administered 2019-01-20: 21:00:00 1 g via INTRAVENOUS
  Filled 2019-01-19: qty 1
  Filled 2019-01-19: qty 10

## 2019-01-19 MED ORDER — VANCOMYCIN HCL IN DEXTROSE 1-5 GM/200ML-% IV SOLN
1000.0000 mg | Freq: Once | INTRAVENOUS | Status: AC
  Administered 2019-01-19: 1000 mg via INTRAVENOUS
  Filled 2019-01-19: qty 200

## 2019-01-19 MED ORDER — SODIUM CHLORIDE 0.9 % IV SOLN
INTRAVENOUS | Status: DC
  Administered 2019-01-19: 18:00:00 via INTRAVENOUS

## 2019-01-19 MED ORDER — PRAVASTATIN SODIUM 20 MG PO TABS: 20.0000 mg | ORAL_TABLET | Freq: Every day | ORAL | Status: DC

## 2019-01-19 MED ORDER — LEVOTHYROXINE SODIUM 100 MCG PO TABS
100.0000 ug | ORAL_TABLET | Freq: Every day | ORAL | Status: DC
  Administered 2019-01-20 – 2019-01-23 (×3): 100 ug via ORAL
  Filled 2019-01-19 (×3): qty 1

## 2019-01-19 NOTE — ED Notes (Signed)
ED TO INPATIENT HANDOFF REPORT  ED Nurse Name and Phone #:  Tomi Likens, RN  S Name/Age/Gender [name] 85 y.o. female Room/Bed: WA22/WA22  Code Status   Code Status: Prior  Home/SNF/Other Home Patient oriented to: self Is this baseline? Yes   Triage Complete: Triage complete  Chief Complaint Possible Stroke   Triage Note Pt comes in with Home aid for facial droop, confusion, AMS, not eating, hallucinating  since Friday. Caregiver adds that she was admitted month or so ago for UTI.  Pt had half her AM medications this morning per aid.    Allergies Allergies  Allergen Reactions  . Metformin And Related Diarrhea    Level of Care/Admitting Diagnosis ED Disposition    ED Disposition Condition [facility] [place] [[id number]]  Level of Care: Telemetry [5]  Admit to tele based on following criteria: Other see comments  Comments: abnormal ecg  Covid Evaluation: N/A  Diagnosis: Acute encephalopathy [836629]  Admitting Physician: Toy Baker [[id number]]  Attending Physician: Toy Baker [[id number]]  PT Class (Do Not Modify): Observation [104]  PT Acc Code (Do Not Modify): Observation [10022]       B Medical/Surgery History Past Medical History:  Diagnosis Date  . Acute bronchitis   . Anemia, unspecified   . Atrial fibrillation ([place])    not on AC due to falls  . Depressive disorder, not elsewhere classified   . Diverticulosis of colon (without mention of hemorrhage)   . Esophageal reflux   . Family history of malignant neoplasm of gastrointestinal tract   . Functional diarrhea   . Hiatal hernia   . History of blood transfusion    "once; related to diverticulitis" (11/29/2017)  . Other and unspecified hyperlipidemia   . Other chest pain   . Other specified cardiac dysrhythmias(427.89)   . Personal history of colonic polyps 07/17/1995   adenomatous polyps  . PONV (postoperative nausea and vomiting)   .  Recurrent UTI (urinary tract infection)    "2-3 times in the last 1 1/76yr" (11/29/2017)  . Rheumatoid arthritis ([place])   . Tachycardia, unspecified   . Type II diabetes mellitus ([place])   . Unspecified adverse effect of unspecified drug, medicinal and biological substance   . Unspecified essential hypertension   . Unspecified hypothyroidism    Past Surgical History:  Procedure Laterality Date  . ABDOMINAL HYSTERECTOMY     "partial"  . CATARACT EXTRACTION Bilateral   . FOREARM FRACTURE SURGERY Right 1940s?  . FRACTURE SURGERY    . ORIF FEMUR FRACTURE  08/30/2012   Procedure: OPEN REDUCTION INTERNAL FIXATION (ORIF) DISTAL FEMUR FRACTURE;  Surgeon: Mauri Pole, MD;  Location: [facility];  Service: Orthopedics;  Laterality: Right;  proximal femur  . THYROIDECTOMY, PARTIAL       A IV Location/Drains/Wounds Patient Lines/Drains/Airways Status   Active Line/Drains/Airways    Name:   Placement date:   Placement time:   Site:   Days:   Pressure Injury 11/29/17 Stage II -  Partial thickness loss of dermis presenting as a shallow open ulcer with a red, pink wound bed without slough. small blisterlike with no fluid    11/29/17    1219     416   Pressure Injury 11/29/17 Stage I -  Intact skin with non-blanchable redness of a localized area usually over a bony prominence.   11/29/17    1219     416   Pressure Injury 02/25/18 Stage I -  Intact skin with non-blanchable redness of a localized area usually over a bony prominence.   02/25/18    2133     328   Pressure Injury 02/25/18 Stage IV - Full thickness tissue loss with exposed bone, tendon or muscle. full thickness wound: this is NOT a pressure injury; unusual location and appearance   02/25/18    2133     328   Pressure Injury 11/14/18  REDNESS BUT BLANCHABLE   11/14/18    0400     66   Wound / Incision (Open or Dehisced) 11/29/17 Diabetic ulcer Leg Right   11/29/17    1219    Leg   416   Wound / Incision (Open or Dehisced) 02/25/18 Non-pressure wound  Foot Left   02/25/18    2133    Foot   328   Wound / Incision (Open or Dehisced) 08/10/18 Toe (Comment  which one) Right   08/10/18    1157    Toe (Comment  which one)   162          Intake/Output Last 24 hours No intake or output data in the 24 hours ending 01/19/19 2155  Labs/Imaging Results for orders placed or performed during the hospital encounter of 01/19/19 (from the past 48 hour(s))  CBG monitoring, ED     Status: Abnormal   Collection Time: 01/19/19  2:41 PM  Result Value Ref Range   Glucose-Capillary 120 (H) 70 - 99 mg/dL  Urine rapid drug screen (hosp performed)not at [facility]     Status: None   Collection Time: 01/19/19  3:04 PM  Result Value Ref Range   Opiates NONE DETECTED NONE DETECTED   Cocaine NONE DETECTED NONE DETECTED   Benzodiazepines NONE DETECTED NONE DETECTED   Amphetamines NONE DETECTED NONE DETECTED   Tetrahydrocannabinol NONE DETECTED NONE DETECTED   Barbiturates NONE DETECTED NONE DETECTED    Comment: (NOTE) DRUG SCREEN FOR MEDICAL PURPOSES ONLY.  IF CONFIRMATION IS NEEDED FOR ANY PURPOSE, NOTIFY LAB WITHIN 5 DAYS. LOWEST DETECTABLE LIMITS FOR URINE DRUG SCREEN Drug Class                     Cutoff (ng/mL) Amphetamine and metabolites    1000 Barbiturate and metabolites    200 Benzodiazepine                 382 Tricyclics and metabolites     300 Opiates and metabolites        300 Cocaine and metabolites        300 THC                            50 Performed at [facility], [place] [address] [place], [place] [address]   Urinalysis, Routine w reflex microscopic (not at [facility])     Status: Abnormal   Collection Time: 01/19/19  3:04 PM  Result Value Ref Range   Color, Urine YELLOW YELLOW   APPearance TURBID (A) CLEAR   Specific Gravity, Urine 1.017 1.005 - 1.030   pH 5.0 5.0 - 8.0   Glucose, UA NEGATIVE NEGATIVE mg/dL   Hgb urine dipstick SMALL (A) NEGATIVE   Bilirubin Urine NEGATIVE NEGATIVE   Ketones, ur 20 (A) NEGATIVE mg/dL    Protein, ur 100 (A) NEGATIVE mg/dL   Nitrite NEGATIVE NEGATIVE   Leukocytes,Ua MODERATE (A) NEGATIVE   RBC / HPF 6-10 0 - 5 RBC/hpf  WBC, UA 21-50 0 - 5 WBC/hpf   Bacteria, UA NONE SEEN NONE SEEN   WBC Clumps PRESENT    Mucus PRESENT    Budding Yeast PRESENT     Comment: Performed at [facility], [place] [address] [place], [place] [address]  CBC with Differential/Platelet     Status: Abnormal   Collection Time: 01/19/19  3:18 PM  Result Value Ref Range   WBC 9.5 4.0 - 10.5 K/uL   RBC 4.67 3.87 - 5.11 MIL/uL   Hemoglobin 13.9 12.0 - 15.0 g/dL   HCT 45.6 36.0 - 46.0 %   MCV 97.6 80.0 - 100.0 fL   MCH 29.8 26.0 - 34.0 pg   MCHC 30.5 30.0 - 36.0 g/dL   RDW 16.1 (H) 11.5 - 15.5 %   Platelets 249 150 - 400 K/uL   nRBC 0.0 0.0 - 0.2 %   Neutrophils Relative % 73 %   Neutro Abs 6.8 1.7 - 7.7 K/uL   Lymphocytes Relative 17 %   Lymphs Abs 1.6 0.7 - 4.0 K/uL   Monocytes Relative 8 %   Monocytes Absolute 0.8 0.1 - 1.0 K/uL   Eosinophils Relative 1 %   Eosinophils Absolute 0.1 0.0 - 0.5 K/uL   Basophils Relative 1 %   Basophils Absolute 0.1 0.0 - 0.1 K/uL   Immature Granulocytes 0 %   Abs Immature Granulocytes 0.03 0.00 - 0.07 K/uL    Comment: Performed at [facility], [place] [address], [place], [place] [address]  APTT     Status: None   Collection Time: 01/19/19  4:12 PM  Result Value Ref Range   aPTT 25 24 - 36 seconds    Comment: Performed at [facility], [place] [address], [place], [place] [address]  Protime-INR     Status: None   Collection Time: 01/19/19  4:12 PM  Result Value Ref Range   Prothrombin Time 13.3 11.4 - 15.2 seconds   INR 1.0 0.8 - 1.2    Comment: (NOTE) INR goal varies based on device and disease states. Performed at [facility], [place] [address], [place], [place] [address]   Comprehensive metabolic panel     Status: Abnormal   Collection Time: 01/19/19  4:50 PM  Result Value Ref  Range   Sodium 142 135 - 145 mmol/L   Potassium 3.9 3.5 - 5.1 mmol/L   Chloride 106 98 - 111 mmol/L   CO2 23 22 - 32 mmol/L   Glucose, Bld 137 (H) 70 - 99 mg/dL   BUN 19 8 - 23 mg/dL   Creatinine, Ser 0.66 0.44 - 1.00 mg/dL   Calcium 8.4 (L) 8.9 - 10.3 mg/dL   Total Protein 6.8 6.5 - 8.1 g/dL   Albumin 3.5 3.5 - 5.0 g/dL   AST 22 15 - 41 U/L   ALT 16 0 - 44 U/L   Alkaline Phosphatase 92 38 - 126 U/L   Total Bilirubin 0.8 0.3 - 1.2 mg/dL   GFR calc non Af Amer >60 >60 mL/min   GFR calc Af Amer >60 >60 mL/min   Anion gap 13 5 - 15    Comment: Performed at [facility], [place] [address], [place], [place] [address]  Lactic acid, plasma     Status: None   Collection Time: 01/19/19  6:06 PM  Result Value Ref Range   Lactic Acid, Venous 1.5 0.5 - 1.9 mmol/L    Comment: Performed at [facility]  [place] [address] [place], [place] [address]  SARS Coronavirus 2 [facility] order, Performed in [facility] hospital lab)     Status: None   Collection Time: 01/19/19  6:56 PM  Result Value Ref Range   SARS Coronavirus 2 NEGATIVE NEGATIVE    Comment: (NOTE) If result is NEGATIVE SARS-CoV-2 target nucleic acids are NOT DETECTED. The SARS-CoV-2 RNA is generally detectable in upper and lower  respiratory specimens during the acute phase of infection. The lowest  concentration of SARS-CoV-2 viral copies this assay can detect is 250  copies / mL. A negative result does not preclude SARS-CoV-2 infection  and should not be used as the sole basis for treatment or other  patient management decisions.  A negative result may occur with  improper specimen collection / handling, submission of specimen other  than nasopharyngeal swab, presence of viral mutation(s) within the  areas targeted by this assay, and inadequate number of viral copies  (<250 copies / mL). A negative result must be combined with clinical  observations, patient history, and epidemiological  information. If result is POSITIVE SARS-CoV-2 target nucleic acids are DETECTED. The SARS-CoV-2 RNA is generally detectable in upper and lower  respiratory specimens dur ing the acute phase of infection.  Positive  results are indicative of active infection with SARS-CoV-2.  Clinical  correlation with patient history and other diagnostic information is  necessary to determine patient infection status.  Positive results do  not rule out bacterial infection or co-infection with other viruses. If result is PRESUMPTIVE POSTIVE SARS-CoV-2 nucleic acids MAY BE PRESENT.   A presumptive positive result was obtained on the submitted specimen  and confirmed on repeat testing.  While 2019 novel coronavirus  (SARS-CoV-2) nucleic acids may be present in the submitted sample  additional confirmatory testing may be necessary for epidemiological  and / or clinical management purposes  to differentiate between  SARS-CoV-2 and other Sarbecovirus currently known to infect humans.  If clinically indicated additional testing with an alternate test  methodology [id number]) is advised. The SARS-CoV-2 RNA is generally  detectable in upper and lower respiratory sp ecimens during the acute  phase of infection. The expected result is Negative. Fact Sheet for Patients:  StrictlyIdeas.no Fact Sheet for Healthcare Providers: BankingDealers.co.za This test is not yet approved or cleared by the [place] FDA and has been authorized for detection and/or diagnosis of SARS-CoV-2 by FDA under an Emergency Use Authorization (EUA).  This EUA will remain in effect (meaning this test can be used) for the duration of the COVID-19 declaration under Section 564(b)(1) of the Act, 21 U.S.C. section 360bbb-3(b)(1), unless the authorization is terminated or revoked sooner. Performed at [facility], [place] [address], [place], [place] [address]    Ct Head Wo  Contrast  Result Date: 01/19/2019 CLINICAL DATA:  Facial droop, confusion, loosen a shin EXAM: CT HEAD WITHOUT CONTRAST TECHNIQUE: Contiguous axial images were obtained from the base of the skull through the vertex without intravenous contrast. COMPARISON:  11/11/2018 FINDINGS: Brain: No evidence of acute infarction, hemorrhage, hydrocephalus, extra-axial collection or mass lesion/mass effect. Extensive periventricular and deep white matter hypodensity. Vascular: No hyperdense vessel or unexpected calcification. Skull: Normal. Negative for fracture or focal lesion. Sinuses/Orbits: No acute finding. Other: None. IMPRESSION: No acute intracranial pathology. Extensive small-vessel white matter disease in keeping with advanced patient age. Electronically Signed   By: Eddie Candle M.D.   On: 01/19/2019 16:01   Dg Chest Portable 1 View  Result Date: 01/19/2019 CLINICAL DATA:  Altered mental status, facial droop. EXAM: PORTABLE CHEST 1 VIEW COMPARISON:  Radiographs of December 22, 2018. FINDINGS: Stable cardiomediastinal silhouette. Both lungs are clear. No pneumothorax or pleural effusion is noted. The visualized skeletal structures are unremarkable. IMPRESSION: No active disease. Electronically Signed   By: Marijo Conception M.D.   On: 01/19/2019 16:15    Pending Labs Unresulted Labs (From admission, onward)    Start     Ordered   01/19/19 2005  Troponin I - Now Then Q6H  Now then every 6 hours,   R    Question:  Specimen collection method  Answer:  IV Team=IV Team collect   01/19/19 2004   01/19/19 1807  Culture, blood (routine x 2)  BLOOD CULTURE X 2,   STAT     01/19/19 1811   01/19/19 1806  Lactic acid, plasma  Now then every 2 hours,   STAT    Question:  Specimen collection method  Answer:  IV Team=IV Team collect   01/19/19 1811   01/19/19 1754  Urine culture  Add-on,   STAT     01/19/19 1754   Signed and Held  Hemoglobin A1c  Tomorrow morning,   R    Comments:  To assess prior glycemic control    Question:  Specimen collection method  Answer:  IV Team=IV Team collect   Signed and Held   Signed and Held  Magnesium  Tomorrow morning,   R    Comments:  Call MD if <1.5   Question:  Specimen collection method  Answer:  IV Team=IV Team collect   Signed and Held   Signed and Held  Phosphorus  Tomorrow morning,   R    Question:  Specimen collection method  Answer:  IV Team=IV Team collect   Signed and Held   Signed and Held  TSH  Once,   R    Comments:  Cancel if already done within 1 month and notify MD   Question:  Specimen collection method  Answer:  IV Team=IV Team collect   Signed and Held   Signed and Held  Comprehensive metabolic panel  Once,   R    Comments:  Cal MD for K<3.5 or >5.0   Question:  Specimen collection method  Answer:  IV Team=IV Team collect   Signed and Held   Signed and Held  CBC  Once,   R    Comments:  Call for hg <8.0   Question:  Specimen collection method  Answer:  IV Team=IV Team collect   Signed and Held          Vitals/Pain Today's Vitals   01/19/19 1737 01/19/19 1800 01/19/19 1830 01/19/19 2130  BP: (!) 153/104 (!) 146/98 (!) 154/90 128/85  Pulse: 78 90 90 78  Resp: 14 (!) 22 20 20   Temp:    99.8 F (37.7 C)  TempSrc:    Oral  SpO2: 100% 96% 95% 97%  PainSc:        Isolation Precautions No active isolations  Medications Medications  0.9 %  sodium chloride infusion ( Intravenous New Bag/Given 01/19/19 1748)  cefTRIAXone (ROCEPHIN) 1 g in sodium chloride 0.9 % 100 mL IVPB (has no administration in time range)  amLODipine (NORVASC) tablet 2.5 mg (has no administration in time range)  levothyroxine (SYNTHROID) tablet 100 mcg (has no administration in time range)  mirtazapine (REMERON) tablet 7.5 mg (has no administration in time range)  mirabegron ER (  MYRBETRIQ) tablet 25 mg (has no administration in time range)  pravastatin (PRAVACHOL) tablet 20 mg (has no administration in time range)  predniSONE (DELTASONE) tablet 4 mg (has no  administration in time range)  vancomycin (VANCOCIN) IVPB 1000 mg/200 mL premix (0 mg Intravenous Stopped 01/19/19 2146)    Mobility non-ambulatory High fall risk   Focused Assessments    R Recommendations: See Admitting Provider Note  Report given to:   Additional Notes:

## 2019-01-19 NOTE — ED Notes (Signed)
x4 unsuccessful IV attempts by 2 RNs. IV team consulted. MD made aware.

## 2019-01-19 NOTE — ED Notes (Signed)
IV team bedside. 

## 2019-01-19 NOTE — Progress Notes (Signed)
Spoke with patient's nurse and notified that this nurse recommends to have MD f/u regarding vascular access. Midline was not attempted d/t vasculature branching. Fran [place], RN VAST

## 2019-01-19 NOTE — ED Notes (Signed)
ED Provider at bedside. 

## 2019-01-19 NOTE — ED Provider Notes (Addendum)
Patient accepted from Dr. Tomi Bamberger at shift change.  Patient has become very confused over the past several days per her caregiver.  Typically, the patient is very cheerful and kind.  She reports over several days, she has become combative and very confused.  Her caregiver from yesterday reports that she tried to take her out in the car and she was trying to pluck things out of the air.  They can today no longer get her to eat or drink.  Patient's caregiver who is with her currently reports that when the patient is well, she is very active and requesting that her hair and make-up be done and she is not asked about that for days. Physical Exam  BP (!) 146/98   Pulse 90   Temp 99.9 F (37.7 C) (Rectal)   Resp (!) 22   SpO2 96%   Physical Exam Patient is a very frail, elderly appearing female.  She seems quite confused.  Speech is rambling.  She is easily irritated and becomes resistant.  She does seem to make some occasional random picking motions with her hands.  No respiratory distress.  Skin warm and dry.  No peripheral edema.  Severe changes of rheumatoid arthritis in extremities. ED Course/Procedures   Clinical Course as of Jan 19 1811  Mon Jan 19, 2019  1712 nl  CBC with Differential/Platelet(!) [JK]  1712 CT and CXR negative   [JK]    Clinical Course User Index [JK] Dorie Rank, MD    Procedures Angiocath insertion Performed by: Charlesetta Shanks  Consent: Verbal consent obtained. Risks and benefits: risks, benefits and alternatives were discussed Time out: Immediately prior to procedure a "time out" was called to verify the correct patient, procedure, equipment, support staff and site/side marked as required.  Preparation: Patient was prepped and draped in the usual sterile fashion.  Vein Location: right basilic  Yes Ultrasound Guided  Gauge: 20G  Normal blood return and flush without difficulty Patient tolerance: Patient tolerated the procedure well with no immediate  complications.   MDM  Care initiated by Dr. Tomi Bamberger.  Dominant finding is severe confusion.  Nursing staff reports they have been unable to get patient to take anything orally.  Today's presentation is consistent with prior admissions for confusion and encephalopathy with associated UTI.  He has had recurrent enterococcus faecalis UTI.  At this time, she is not a candidate for oral therapy.  Prior cultures show vancomycin sensitivity.  1 dose administered.  Patient will need IV hydration.  Plan for admission.      Charlesetta Shanks, MD 01/19/19 Charlesetta Ivory    Charlesetta Shanks, MD 01/19/19 2043

## 2019-01-19 NOTE — Progress Notes (Signed)
CRITICAL VALUE STICKER  CRITICAL VALUE: troponin 0.04  DATE & TIME NOTIFIED: 01/19/19  @2330   MD NOTIFIED:  Roel Cluck  TIME OF NOTIFICATION:  2339  RESPONSE: Awaiting

## 2019-01-19 NOTE — Progress Notes (Signed)
Pharmacy Antibiotic Note  [name] is a 85 y.o. female admitted on 01/19/2019 with UTI.  Pharmacy has been consulted for vancomycin dosing.  Plan: Rocephin 1 Gm IV q24h (MD) Vancomycin 1 Gm IV q36h for est AUC = 540 Goal AUC =400-550 F/u scr/cultures/levels     Temp (24hrs), Avg:99.9 F (37.7 C), Min:99.9 F (37.7 C), Max:99.9 F (37.7 C)  Recent Labs  Lab 01/19/19 1518 01/19/19 1650 01/19/19 1806  WBC 9.5  --   --   CREATININE  --  0.66  --   LATICACIDVEN  --   --  1.5    CrCl cannot be calculated (Unknown ideal weight.).    Allergies  Allergen Reactions  . Metformin And Related Diarrhea    Antimicrobials this admission: 4/27 rocephin >>  4/27 vancomycin >>   Dose adjustments this admission:   Microbiology results:  BCx:   UCx:    Sputum:    MRSA PCR:   Thank you for allowing pharmacy to be a part of this patient's care.  Dorrene German 01/19/2019 9:38 PM

## 2019-01-19 NOTE — ED Provider Notes (Signed)
[facility] DEPT Provider Note   CSN: [id number] Arrival date & time: 01/19/19  1414   Level 5 caveat: Altered mental status, caregiver provides the history History   Chief Complaint Chief Complaint  Patient presents with   Altered Mental Status   Facial Droop    HPI [name] is a 85 y.o. female.     HPI Patient has a history of multiple medical problems including dementia, A. fib prior TIA.  Patient lives at home and has caregivers.  Patient's caregiver noticed her symptoms Friday.  The patient has been confused and hallucinating.  They felt that she might of had a facial droop.  This morning when the caregiver arrived to check on her exam the patient was very confused.  She was agitated asking "to help her get out of here" when the patient was in her own home.  She did not recognize her caregiver and this was atypical.  No known fevers.  No vomiting or diarrhea. Past Medical History:  Diagnosis Date   Acute bronchitis    Anemia, unspecified    Atrial fibrillation (HCC)    not on AC due to falls   Depressive disorder, not elsewhere classified    Diverticulosis of colon (without mention of hemorrhage)    Esophageal reflux    Family history of malignant neoplasm of gastrointestinal tract    Functional diarrhea    Hiatal hernia    History of blood transfusion    "once; related to diverticulitis" (11/29/2017)   Other and unspecified hyperlipidemia    Other chest pain    Other specified cardiac dysrhythmias(427.89)    Personal history of colonic polyps 07/17/1995   adenomatous polyps   PONV (postoperative nausea and vomiting)    Recurrent UTI (urinary tract infection)    "2-3 times in the last 1 1/80yr" (11/29/2017)   Rheumatoid arthritis ([place])    Tachycardia, unspecified    Type II diabetes mellitus ([place])    Unspecified adverse effect of unspecified drug, medicinal and biological substance    Unspecified  essential hypertension    Unspecified hypothyroidism     Patient Active Problem List   Diagnosis Date Noted   Dementia without behavioral disturbance ([place]) 11/12/2018   Urinary tract infection without hematuria    GI bleed 08/08/2018   Altered mental status, unspecified 06/04/2018   Acute encephalopathy 06/03/2018   Atrial fibrillation, chronic 06/03/2018   Pneumonia 04/26/2018   TIA (transient ischemic attack) 04/25/2018   Aphasia    Pressure injury of skin 02/26/2018   Hyperkalemia 02/25/2018   Altered mental status 02/25/2018   Acute lower UTI 02/25/2018   Iron deficiency anemia due to chronic blood loss 12/17/2017   Protein-calorie malnutrition, severe 11/30/2017   Syncope 11/29/2017   Diabetic ulcer of left foot associated with type 2 diabetes mellitus ([place]) 08/11/2015   Closed right hip fracture ([place]) 08/28/2012   History of gastroesophageal reflux (GERD) 06/26/2011   Lower GI bleeding 06/26/2011   DEPRESSION 03/31/2009   DIARRHEA, FUNCTIONAL 03/31/2009   DIABETIC HYPOGLYCEMIA, TYPE II 11/03/2008   ACUTE BRONCHITIS 08/23/2008   AV NODAL REENTRY TACHYCARDIA 10/29/2007   TACHYCARDIA 10/29/2007   CHEST PAIN, ATYPICAL 10/25/2007   ADVEF, DRUG/MEDICINAL/BIOLOGICAL SUBST NOS 07/16/2007   Nonspecific (abnormal) findings on radiological and other examination of body structure 04/03/2007   CHEST XRAY, ABNORMAL 04/03/2007   Hypothyroidism (acquired) 03/06/2007   Type 2 diabetes mellitus with other specified complication ([place]) 00/92/3300   HYPERLIPIDEMIA 03/06/2007   Essential hypertension 03/06/2007  DIVERTICULOSIS, COLON 03/06/2007   Rheumatoid arthritis involving both hands with positive rheumatoid factor ([place]) 03/06/2007    Past Surgical History:  Procedure Laterality Date   ABDOMINAL HYSTERECTOMY     "partial"   CATARACT EXTRACTION Bilateral    FOREARM FRACTURE SURGERY Right 1940s?   FRACTURE SURGERY     ORIF FEMUR FRACTURE   08/30/2012   Procedure: OPEN REDUCTION INTERNAL FIXATION (ORIF) DISTAL FEMUR FRACTURE;  Surgeon: Mauri Pole, MD;  Location: [facility];  Service: Orthopedics;  Laterality: Right;  proximal femur   THYROIDECTOMY, PARTIAL       OB History   No obstetric history on file.      Home Medications    Prior to Admission medications   Medication Sig Start Date End Date Taking? Authorizing Provider  acetaminophen (TYLENOL) 325 MG tablet Take 2 tablets (650 mg total) by mouth every 6 (six) hours as needed for mild pain, fever or headache (or Fever >/= 101). Patient not taking: Reported on 12/22/2018 08/14/18   Roxan Hockey, MD  amLODipine (NORVASC) 2.5 MG tablet Take 1 tablet (2.5 mg total) by mouth daily. 11/14/18   Eugenie Filler, MD  benzonatate (TESSALON) 200 MG capsule Take 200 mg by mouth 3 (three) times daily as needed for cough. 08/05/18   [provider]  calcium carbonate (TUMS - DOSED IN MG ELEMENTAL CALCIUM) 500 MG chewable tablet Chew 1 tablet (200 mg of elemental calcium total) by mouth 2 (two) times daily. Patient not taking: Reported on 12/22/2018 08/14/18   Roxan Hockey, MD  collagenase (SANTYL) ointment Apply topically daily. Apply Santyl to right 2nd toe wound Q day, then cover with moist 2X2 and foam dressing.  (Change foam dressing Q 3 days or PRN soiling.) Patient not taking: Reported on 12/22/2018 08/15/18   Roxan Hockey, MD  doxycycline (VIBRA-TABS) 100 MG tablet Take 100 mg by mouth 2 (two) times daily. x7 days 12/19/18   [provider]  feeding supplement, ENSURE ENLIVE, (ENSURE ENLIVE) LIQD Take 237 mLs by mouth 2 (two) times daily between meals. Patient not taking: Reported on 12/22/2018 02/26/18   Lavina Hamman, MD  folic acid (FOLVITE) 1 MG tablet Take 1 mg by mouth daily.  07/16/18   [provider]  iron polysaccharides (NIFEREX) 150 MG capsule Take 1 capsule (150 mg total) by mouth daily. 11/14/18   Eugenie Filler, MD    leflunomide (ARAVA) 20 MG tablet Take 20 mg by mouth daily. 08/14/17   [provider]  levothyroxine (SYNTHROID) 100 MCG tablet Take 1 tablet (100 mcg total) by mouth daily. 12/07/17 12/22/18  Regalado, Belkys A, MD  magnesium oxide (MAG-OX) 400 (241.3 Mg) MG tablet Take 0.5 tablets (200 mg total) by mouth 2 (two) times daily. Patient taking differently: Take 400 mg by mouth daily.  12/07/17   Regalado, Belkys A, MD  Melatonin 2.5 MG CAPS Take 1 capsule (2.5 mg total) by mouth at bedtime as needed (for sleep). Patient taking differently: Take 2.5 mg by mouth at bedtime.  12/07/17   Regalado, Belkys A, MD  metFORMIN (GLUCOPHAGE) 500 MG tablet Take 500-1,000 mg by mouth 2 (two) times daily with a meal. 1000 mg in the am. 500 mg in evening.    [provider]  mirtazapine (REMERON) 7.5 MG tablet Take 7.5 mg by mouth at bedtime. 08/05/18   [provider]  Multiple Vitamins-Minerals (CENTRUM SILVER 50+WOMEN) TABS Take 1 tablet by mouth daily.    [provider]  [facility]  25 MG TB24 tablet Take 25 mg by mouth daily. 06/25/18   [provider]  ondansetron (ZOFRAN) 4 MG tablet Take 1 tablet (4 mg total) by mouth every 6 (six) hours as needed for nausea. 08/14/18   Roxan Hockey, MD  pantoprazole (PROTONIX) 40 MG tablet Take 1 tablet (40 mg total) by mouth 2 (two) times daily. 08/14/18   Roxan Hockey, MD  Pitavastatin Calcium (LIVALO) 2 MG TABS Take 2 mg by mouth every Monday, Wednesday, and Friday.    [provider]  predniSONE (DELTASONE) 1 MG tablet Take 4 mg by mouth daily with breakfast.     [provider]  senna (SENOKOT) 8.6 MG TABS tablet Take 1 tablet (8.6 mg total) by mouth at bedtime. Patient not taking: Reported on 12/22/2018 11/14/18   Eugenie Filler, MD  TRADJENTA 5 MG TABS tablet Take 5 mg by mouth daily. 01/29/18   [provider]    Family History Family History  Problem Relation Age of Onset   Colon  cancer Maternal Grandmother     Social History Social History   Tobacco Use   Smoking status: Never Smoker   Smokeless tobacco: Never Used  Substance Use Topics   Alcohol use: Not Currently    Alcohol/week: 1.0 standard drinks    Types: 1 Glasses of wine per week   Drug use: No     Allergies   Metformin and related   Review of Systems Review of Systems  All other systems reviewed and are negative.    Physical Exam Updated Vital Signs BP (!) 164/98    Pulse 98    Temp 99.9 F (37.7 C) (Rectal)    Resp 19    SpO2 95%   Physical Exam Vitals signs and nursing note reviewed.  Constitutional:      General: She is not in acute distress.    Appearance: She is well-developed.     Comments: Elderly, frail  HENT:     Head: Normocephalic and atraumatic.     Right Ear: External ear normal.     Left Ear: External ear normal.  Eyes:     General: No scleral icterus.       Right eye: No discharge.        Left eye: No discharge.     Conjunctiva/sclera: Conjunctivae normal.  Neck:     Musculoskeletal: Neck supple.     Trachea: No tracheal deviation.  Cardiovascular:     Rate and Rhythm: Normal rate and regular rhythm.  Pulmonary:     Effort: Pulmonary effort is normal. No respiratory distress.     Breath sounds: Normal breath sounds. No stridor. No wheezing or rales.  Abdominal:     General: Bowel sounds are normal. There is no distension.     Palpations: Abdomen is soft.     Tenderness: There is no abdominal tenderness. There is no guarding or rebound.  Musculoskeletal:        General: No tenderness.     Comments: Rheumatoid contractures noted hands  Skin:    General: Skin is warm and dry.     Findings: No rash.  Neurological:     GCS: GCS eye subscore is 4. GCS verbal subscore is 4. GCS motor subscore is 6.     Cranial Nerves: No cranial nerve deficit (no facial droop, extraocular movements intact, no slurred speech).     Sensory: No sensory deficit.     Motor:  No abnormal muscle tone or seizure  activity.     Coordination: Coordination normal.     Comments: Patient is confused speech is not slurred, no previous facial droop.  Patient has difficulty complying with exam is able to wiggle her toes and move both upper extremities, questionable weakness in the left side      ED Treatments / Results  Labs (all labs ordered are listed, but only abnormal results are displayed) Labs Reviewed  CBC WITH DIFFERENTIAL/PLATELET - Abnormal; Notable for the following components:      Result Value   RDW 16.1 (*)    All other components within normal limits  CBG MONITORING, ED - Abnormal; Notable for the following components:   Glucose-Capillary 120 (*)    All other components within normal limits  RAPID URINE DRUG SCREEN, HOSP PERFORMED  URINALYSIS, ROUTINE W REFLEX MICROSCOPIC  PROTIME-INR  APTT  COMPREHENSIVE METABOLIC PANEL  APTT  PROTIME-INR  COMPREHENSIVE METABOLIC PANEL    EKG EKG Interpretation  Date/Time:  Monday January 19 2019 14:39:31 EDT Ventricular Rate:  96 PR Interval:    QRS Duration: 94 QT Interval:  361 QTC Calculation: 427 R Axis:   88 Text Interpretation:  undetermined rhythm Supraventricular bigeminy Short PR interval LVH with secondary repolarization abnormality Probable anterior infarct, age indeterminate No significant change since last tracing Confirmed by Dorie Rank [phone]) on 01/19/2019 2:43:06 PM   Radiology Ct Head Wo Contrast  Result Date: 01/19/2019 CLINICAL DATA:  Facial droop, confusion, loosen a shin EXAM: CT HEAD WITHOUT CONTRAST TECHNIQUE: Contiguous axial images were obtained from the base of the skull through the vertex without intravenous contrast. COMPARISON:  11/11/2018 FINDINGS: Brain: No evidence of acute infarction, hemorrhage, hydrocephalus, extra-axial collection or mass lesion/mass effect. Extensive periventricular and deep white matter hypodensity. Vascular: No hyperdense vessel or unexpected  calcification. Skull: Normal. Negative for fracture or focal lesion. Sinuses/Orbits: No acute finding. Other: None. IMPRESSION: No acute intracranial pathology. Extensive small-vessel white matter disease in keeping with advanced patient age. Electronically Signed   By: Eddie Candle M.D.   On: 01/19/2019 16:01   Dg Chest Portable 1 View  Result Date: 01/19/2019 CLINICAL DATA:  Altered mental status, facial droop. EXAM: PORTABLE CHEST 1 VIEW COMPARISON:  Radiographs of December 22, 2018. FINDINGS: Stable cardiomediastinal silhouette. Both lungs are clear. No pneumothorax or pleural effusion is noted. The visualized skeletal structures are unremarkable. IMPRESSION: No active disease. Electronically Signed   By: Marijo Conception M.D.   On: 01/19/2019 16:15    Procedures Procedures (including critical care time)  Medications Ordered in ED Medications  0.9 %  sodium chloride infusion (has no administration in time range)     Initial Impression / Assessment and Plan / ED Course  I have reviewed the triage vital signs and the nursing notes.  Pertinent labs & imaging results that were available during my care of the patient were reviewed by me and considered in my medical decision making (see chart for details).  Clinical Course as of Jan 19 1711  Mon Jan 19, 2019  1712 nl  CBC with Differential/Platelet(!) [JK]  1712 CT and CXR negative   [JK]    Clinical Course User Index [JK] Dorie Rank, MD  Pt presents with altered mental status.  Questionable facial droop.   No definite finding on my exam.  Infection, electrolyte abnormality  is also a concern.  UTI is pending.  Dr Vallery Ridge will follow up on labs.  Anticipate admission for AMS.  Final Clinical Impressions(s) / ED Diagnoses  pending   Dorie Rank, MD 01/19/19 [phone]

## 2019-01-19 NOTE — ED Triage Notes (Signed)
Pt comes in with Home aid for facial droop, confusion, AMS, not eating, hallucinating  since Friday. Caregiver adds that she was admitted month or so ago for UTI.  Pt had half her AM medications this morning per aid.

## 2019-01-19 NOTE — H&P (Signed)
[name] PPI:[id number] DOB: 1928/10/29 DOA: 01/19/2019     PCP: Deland Pretty, MD   Outpatient Specialists:     Oncology   Dr. Lebron Conners    Podiatry [place]  Patient arrived to ER on 01/19/19 at 1414  Patient coming from: home Lives  With care givers    Chief Complaint:  Chief Complaint  Patient presents with  . Altered Mental Status  . Facial Droop    HPI: [name] is a 85 y.o. female with medical history significant of fibrillation, type 2 diabetes, rheumatoid arthritis, frequent UTI, prior TIA wheelchair-bound dementia, iron deficiency anemia    Presented with  Worsening confusion similar to prior admission.  Has been hallucinating and not eating as well.  There is been going on since Friday.  They felt that she may have had a facial droop she has not had any fevers no nausea vomiting no diarrhea Patient has not been able to take anything orally.  Last time admitted from 2/18 to 11/14/2018 for AMS due to Enterococcus faecalis UTI (sensitive to ampicillin levofloxacin nitrofurantoin and vancomycin)   Patient was started on IV Rocephin which was subsequently discontinued and patient placed on oral fosfomycin x1 dose. During hospitalization Hemoglobin trickling down to as low as 7.7.  Patient given a dose of IV Feraheme  Infectious risk factors: confusion in elderly        Regarding pertinent Chronic problems:    right lower extremity wound requiring wound care RA: on Arava, prednisone, folic acid DM 2 on Tradjenta and metformin  History of atrial fibrillation not on anticoagulation secondary to repeated falls. While in ER: On physical exam no facial droop was noted, No focal deficits.  No slurred speech but patient is confused was felt to be more acute encephalopathy around 9 neurological change. The following Work up has been ordered so far:  Orders Placed This Encounter  Procedures  . Urine culture  . Culture, blood (routine x 2)  . SARS  Coronavirus 2 [facility] order, Performed in [facility] hospital lab)  . CT HEAD WO CONTRAST  . DG Chest Portable 1 View  . Urine rapid drug screen (hosp performed)not at [facility]  . Urinalysis, Routine w reflex microscopic (not at [facility])  . CBC with Differential/Platelet  . APTT  . Protime-INR  . Comprehensive metabolic panel  . Lactic acid, plasma  . Diet NPO time specified  . Cardiac monitoring  . Saline Lock IV, Maintain IV access  . Neuro checks q 2 hours x12 hours  . Safety precautions 1 to 1 observation  . Vital signs  . Cardiac Monitoring  . Modified Stroke Scale (mNIHSS) Document mNIHSS assessment every 2 hours for a total of 12 hours  . Stroke swallow screen  . Initiate Carrier Fluid Protocol  . If O2 sat If O2 Sat < 94%, administer O2 at 2 liters/minute via nasal cannula.  . Consult to hospitalist  . Droplet and Contact precautions  . Pulse oximetry, continuous  . CBG monitoring, ED  . EKG 12-Lead  . ED EKG  . Saline lock IV     Following Medications were ordered in ER: Medications  0.9 %  sodium chloride infusion ( Intravenous New Bag/Given 01/19/19 1748)  vancomycin (VANCOCIN) IVPB 1000 mg/200 mL premix (1,000 mg Intravenous New Bag/Given 01/19/19 1847)  cefTRIAXone (ROCEPHIN) 1 g in sodium chloride 0.9 % 100 mL IVPB (has no administration in time range)        Consult Orders  (  From admission, onward)         Start     Ordered   01/19/19 1811  Consult to hospitalist  Once    Provider:  Toy Baker, MD  Question Answer Comment  Place call to: [facility]   Reason for Consult Admit      01/19/19 1811          Significant initial  Findings: Abnormal Labs Reviewed  URINALYSIS, ROUTINE W REFLEX MICROSCOPIC - Abnormal; Notable for the following components:      Result Value   APPearance TURBID (*)    Hgb urine dipstick SMALL (*)    Ketones, ur 20 (*)    Protein, ur 100 (*)    Leukocytes,Ua MODERATE (*)    All other components within  normal limits  CBC WITH DIFFERENTIAL/PLATELET - Abnormal; Notable for the following components:   RDW 16.1 (*)    All other components within normal limits  COMPREHENSIVE METABOLIC PANEL - Abnormal; Notable for the following components:   Glucose, Bld 137 (*)    Calcium 8.4 (*)    All other components within normal limits  CBG MONITORING, ED - Abnormal; Notable for the following components:   Glucose-Capillary 120 (*)    All other components within normal limits    Otherwise labs showing:    Recent Labs  Lab 01/19/19 1650  NA 142  K 3.9  CO2 23  GLUCOSE 137*  BUN 19  CREATININE 0.66  CALCIUM 8.4*    Cr    stable,   Lab Results  Component Value Date   CREATININE 0.66 01/19/2019   CREATININE 0.48 11/14/2018   CREATININE 0.51 11/13/2018    Recent Labs  Lab 01/19/19 1650  AST 22  ALT 16  ALKPHOS 92  BILITOT 0.8  PROT 6.8  ALBUMIN 3.5      WBC       Component Value Date/Time   WBC 9.5 01/19/2019 1518    Lactic Acid, Venous    Component Value Date/Time   LATICACIDVEN 1.6 11/11/2018 1255        HG/HCT    Up from baseline see below    Component Value Date/Time   HGB 13.9 01/19/2019 1518   HGB 9.4 (L) 12/17/2017 0939   HCT 45.6 01/19/2019 1518       DM  labs:  HbA1C: No results for input(s): HGBA1C in the last 72 hours. CBG: Recent Labs  Lab 01/19/19 1441  GLUCAP 120*      UA   evidence of UTI   Urine analysis:    Component Value Date/Time   COLORURINE YELLOW 01/19/2019 1504   APPEARANCEUR TURBID (A) 01/19/2019 1504   LABSPEC 1.017 01/19/2019 1504   PHURINE 5.0 01/19/2019 1504   GLUCOSEU NEGATIVE 01/19/2019 1504   HGBUR SMALL (A) 01/19/2019 1504   HGBUR negative 03/31/2009 1047   BILIRUBINUR NEGATIVE 01/19/2019 1504   KETONESUR 20 (A) 01/19/2019 1504   PROTEINUR 100 (A) 01/19/2019 1504   UROBILINOGEN 0.2 08/28/2012 0543   NITRITE NEGATIVE 01/19/2019 1504   LEUKOCYTESUR MODERATE (A) 01/19/2019 1504      CT HEAD  NON acute   CXR -  NON acute    ECG:  Personally reviewed by me showing: HR : 96 Rhythm:Supraventricular bigeminy Short PR interval LVH with secondary repolarization abnormality  nonspecific changes  [id number]      ED Triage Vitals  Enc Vitals Group     BP 01/19/19 1443 (!) 160/95     Pulse Rate  01/19/19 1443 94     Resp 01/19/19 1443 19     Temp 01/19/19 1443 99.9 F (37.7 C)     Temp Source 01/19/19 1443 Rectal     SpO2 01/19/19 1443 93 %     Weight --      Height --      Head Circumference --      Peak Flow --      Pain Score 01/19/19 1424 0     Pain Loc --      Pain Edu? --      Excl. in [place]? --   TMAX(24)@       Latest  Blood pressure (!) 154/90, pulse 90, temperature 99.9 F (37.7 C), temperature source Rectal, resp. rate 20, SpO2 95 %.     Hospitalist was called for admission for Acute encephalopathy in the setting of UTI   Review of Systems:    Pertinent positives include:  Fatigue, confusion and hallucinations  Constitutional:  No weight loss, night sweats, Fevers, chills, weight loss  HEENT:  No headaches, Difficulty swallowing,Tooth/dental problems,Sore throat,  No sneezing, itching, ear ache, nasal congestion, post nasal drip,  Cardio-vascular:  No chest pain, Orthopnea, PND, anasarca, dizziness, palpitations.no Bilateral lower extremity swelling  GI:  No heartburn, indigestion, abdominal pain, nausea, vomiting, diarrhea, change in bowel habits, loss of appetite, melena, blood in stool, hematemesis Resp:  no shortness of breath at rest. No dyspnea on exertion, No excess mucus, no productive cough, No non-productive cough, No coughing up of blood.No change in color of mucus.No wheezing. Skin:  no rash or lesions. No jaundice GU:  no dysuria, change in color of urine, no urgency or frequency. No straining to urinate.  No flank pain.  Musculoskeletal:  No joint pain or no joint swelling. No decreased range of motion. No back pain.  Psych:  No change in mood or  affect. No depression or anxiety. No memory loss.  Neuro: no localizing neurological complaints, no tingling, no weakness, no double vision, no gait abnormality, no slurred speech   All systems reviewed and apart from [place] all are negative  Past Medical History:   Past Medical History:  Diagnosis Date  . Acute bronchitis   . Anemia, unspecified   . Atrial fibrillation ([place])    not on AC due to falls  . Depressive disorder, not elsewhere classified   . Diverticulosis of colon (without mention of hemorrhage)   . Esophageal reflux   . Family history of malignant neoplasm of gastrointestinal tract   . Functional diarrhea   . Hiatal hernia   . History of blood transfusion    "once; related to diverticulitis" (11/29/2017)  . Other and unspecified hyperlipidemia   . Other chest pain   . Other specified cardiac dysrhythmias(427.89)   . Personal history of colonic polyps 07/17/1995   adenomatous polyps  . PONV (postoperative nausea and vomiting)   . Recurrent UTI (urinary tract infection)    "2-3 times in the last 1 1/83yr" (11/29/2017)  . Rheumatoid arthritis ([place])   . Tachycardia, unspecified   . Type II diabetes mellitus ([place])   . Unspecified adverse effect of unspecified drug, medicinal and biological substance   . Unspecified essential hypertension   . Unspecified hypothyroidism       Past Surgical History:  Procedure Laterality Date  . ABDOMINAL HYSTERECTOMY     "partial"  . CATARACT EXTRACTION Bilateral   . FOREARM FRACTURE SURGERY Right 1940s?  . FRACTURE SURGERY    .  ORIF FEMUR FRACTURE  08/30/2012   Procedure: OPEN REDUCTION INTERNAL FIXATION (ORIF) DISTAL FEMUR FRACTURE;  Surgeon: Mauri Pole, MD;  Location: [facility] ORS;  Service: Orthopedics;  Laterality: Right;  proximal femur  . THYROIDECTOMY, PARTIAL      Social History:  Ambulatory  wheelchair bound,      reports that she has never smoked. She has never used smokeless tobacco. She reports previous alcohol use of  about 1.0 standard drinks of alcohol per week. She reports that she does not use drugs.     Family History:   Family History  Problem Relation Age of Onset  . Colon cancer Maternal Grandmother     Allergies: Allergies  Allergen Reactions  . Metformin And Related Diarrhea     Prior to Admission medications   Medication Sig Start Date End Date Taking? Authorizing Provider  acetaminophen (TYLENOL) 325 MG tablet Take 2 tablets (650 mg total) by mouth every 6 (six) hours as needed for mild pain, fever or headache (or Fever >/= 101). 08/14/18  Yes Emokpae, Courage, MD  amLODipine (NORVASC) 2.5 MG tablet Take 1 tablet (2.5 mg total) by mouth daily. 11/14/18  Yes Eugenie Filler, MD  benzonatate (TESSALON) 200 MG capsule Take 200 mg by mouth 3 (three) times daily as needed for cough. 08/05/18  Yes [provider]  calcium carbonate (TUMS - DOSED IN MG ELEMENTAL CALCIUM) 500 MG chewable tablet Chew 1 tablet (200 mg of elemental calcium total) by mouth 2 (two) times daily. 08/14/18  Yes Roxan Hockey, MD  collagenase (SANTYL) ointment Apply topically daily. Apply Santyl to right 2nd toe wound Q day, then cover with moist 2X2 and foam dressing.  (Change foam dressing Q 3 days or PRN soiling.) 08/15/18  Yes Emokpae, Courage, MD  diclofenac sodium (VOLTAREN) 1 % GEL Apply 2 g topically 4 (four) times daily.   Yes [provider]  feeding supplement, ENSURE ENLIVE, (ENSURE ENLIVE) LIQD Take 237 mLs by mouth 2 (two) times daily between meals. 02/26/18  Yes Lavina Hamman, MD  folic acid (FOLVITE) 1 MG tablet Take 1 mg by mouth daily.  07/16/18  Yes [provider]  iron polysaccharides (NIFEREX) 150 MG capsule Take 1 capsule (150 mg total) by mouth daily. 11/14/18  Yes Eugenie Filler, MD  leflunomide (ARAVA) 20 MG tablet Take 20 mg by mouth daily. 08/14/17  Yes [provider]  levothyroxine (SYNTHROID) 100 MCG tablet Take 1 tablet (100 mcg total) by mouth  daily. 12/07/17 01/19/19 Yes Regalado, Belkys A, MD  magnesium oxide (MAG-OX) 400 (241.3 Mg) MG tablet Take 0.5 tablets (200 mg total) by mouth 2 (two) times daily. Patient taking differently: Take 400 mg by mouth daily.  12/07/17  Yes Regalado, Belkys A, MD  Melatonin 2.5 MG CAPS Take 1 capsule (2.5 mg total) by mouth at bedtime as needed (for sleep). Patient taking differently: Take 2.5 mg by mouth at bedtime.  12/07/17  Yes Regalado, Belkys A, MD  metFORMIN (GLUCOPHAGE) 500 MG tablet Take 500 mg by mouth daily with breakfast.    Yes [provider]  mirtazapine (REMERON) 7.5 MG tablet Take 7.5 mg by mouth at bedtime. 08/05/18  Yes [provider]  Multiple Vitamins-Minerals (CENTRUM SILVER 50+WOMEN) TABS Take 1 tablet by mouth daily.   Yes [provider]  MYRBETRIQ 25 MG TB24 tablet Take 25 mg by mouth daily. 06/25/18  Yes [provider]  ondansetron (ZOFRAN) 4 MG tablet Take 1 tablet (4 mg total)  by mouth every 6 (six) hours as needed for nausea. 08/14/18  Yes Emokpae, Courage, MD  pantoprazole (PROTONIX) 40 MG tablet Take 1 tablet (40 mg total) by mouth 2 (two) times daily. 08/14/18  Yes Roxan Hockey, MD  Pitavastatin Calcium (LIVALO) 2 MG TABS Take 2 mg by mouth every Monday, Wednesday, and Friday.   Yes [provider]  predniSONE (DELTASONE) 1 MG tablet Take 4 mg by mouth daily with breakfast.    Yes [provider]  TRADJENTA 5 MG TABS tablet Take 5 mg by mouth daily. 01/29/18  Yes [provider]  senna (SENOKOT) 8.6 MG TABS tablet Take 1 tablet (8.6 mg total) by mouth at bedtime. Patient not taking: Reported on 12/22/2018 11/14/18   Eugenie Filler, MD   Physical Exam: Blood pressure (!) 154/90, pulse 90, temperature 99.9 F (37.7 C), temperature source Rectal, resp. rate 20, SpO2 95 %. 1. General:  in No Acute distress    Chronically ill, very thin- appearing 2. Psychological: Alert but not Oriented 3. Head/ENT:     Dry  Mucous Membranes                          Head Non traumatic, neck supple                            Poor Dentition 4. SKIN:   decreased Skin turgor,  Skin clean Dry evidence of denuded area on the right shin per caregiver she got injured evidence of purulent discharge , swelling over left upper extremity at  a site of IV extravasation 5. Heart: Regular rate and rhythm no   Murmur, no Rub or gallop 6. Lungs:  Clear to auscultation bilaterally, no wheezes or crackles   7. Abdomen: Soft,   non-tender, Non distended  bowel sounds present 8. Lower extremities: no clubbing, cyanosis, no  edema 9. Neurologically Grossly intact, moving all 4 extremities equally not fully  cooperative with exam 10. MSK: Normal range of motion   All other LABS:     Recent Labs  Lab 01/19/19 1518  WBC 9.5  NEUTROABS 6.8  HGB 13.9  HCT 45.6  MCV 97.6  PLT 249     Recent Labs  Lab 01/19/19 1650  NA 142  K 3.9  CL 106  CO2 23  GLUCOSE 137*  BUN 19  CREATININE 0.66  CALCIUM 8.4*     Recent Labs  Lab 01/19/19 1650  AST 22  ALT 16  ALKPHOS 92  BILITOT 0.8  PROT 6.8  ALBUMIN 3.5       Cultures:    Component Value Date/Time   SDES  12/16/2018 1145    TOE RIGHT SECOND Performed at [facility] [address], [place], [place] [address]    SPECREQUEST  12/16/2018 1145    NONE Performed at [facility], [place] [address] [place], [place] [address]    CULT FEW METHICILLIN RESISTANT STAPHYLOCOCCUS AUREUS 12/16/2018 1145   REPTSTATUS 12/18/2018 FINAL 12/16/2018 1145     Radiological Exams on Admission: Ct Head Wo Contrast  Result Date: 01/19/2019 CLINICAL DATA:  Facial droop, confusion, loosen a shin EXAM: CT HEAD WITHOUT CONTRAST TECHNIQUE: Contiguous axial images were obtained from the base of the skull through the vertex without intravenous contrast. COMPARISON:  11/11/2018 FINDINGS: Brain: No evidence of acute infarction, hemorrhage, hydrocephalus,  extra-axial collection or mass lesion/mass effect. Extensive periventricular  and deep white matter hypodensity. Vascular: No hyperdense vessel or unexpected calcification. Skull: Normal. Negative for fracture or focal lesion. Sinuses/Orbits: No acute finding. Other: None. IMPRESSION: No acute intracranial pathology. Extensive small-vessel white matter disease in keeping with advanced patient age. Electronically Signed   By: Eddie Candle M.D.   On: 01/19/2019 16:01   Dg Chest Portable 1 View  Result Date: 01/19/2019 CLINICAL DATA:  Altered mental status, facial droop. EXAM: PORTABLE CHEST 1 VIEW COMPARISON:  Radiographs of December 22, 2018. FINDINGS: Stable cardiomediastinal silhouette. Both lungs are clear. No pneumothorax or pleural effusion is noted. The visualized skeletal structures are unremarkable. IMPRESSION: No active disease. Electronically Signed   By: Marijo Conception M.D.   On: 01/19/2019 16:15    Chart has been reviewed    Assessment/Plan   85 y.o. female with medical history significant of fibrillation, type 2 diabetes, rheumatoid arthritis, frequent UTI, prior TIA wheelchair-bound dementia, iron deficiency anemia Admitted for Acute encephalopathy in the setting of UTI   Present on Admission: . Acute encephalopathy -   - most likely multifactorial secondary to combination of  infection   mild dehydration secondary to decreased by mouth intake,    - Will rehydrate  - treat underlining infection    -Symptoms been present for the past 3 to 4 days with negative initial CT scan, discussed with family at this point patient was unlikely to benefit from further aggressive neurological work-up  - neurological exam appears to be nonfocal but patient unable to cooperate fully    . Atrial fibrillation, chronic -         - CHA2DS2 vas score  6 :  Not on anticoagulation secondary to Risk of Falls,   recurrent Gi bleeding         -  Rate control:not on rate controlling Medication at baseline  will continue to monitor rate and rehydrate, currently appears in bigemini will monitor on telemetry      . Hypothyroidism (acquired) - - Check TSH continue home medications at current dose  . Type 2 diabetes mellitus with other specified complication (HCC) -  - Order Sensitive  SSI   -  check TSH and HgA1C  - Hold by mouth medications    . Essential hypertension -continue Norvasc . Rheumatoid arthritis involving both hands with positive rheumatoid factor ([place]) -for now continue prednisone to avoid hypoadrenal state restart Arava and discharge . Iron deficiency anemia due to chronic blood loss currently much improved we will continue to follow    . Pressure injury of skin/right lower extremity abrasion order wound care consult patient has been followed up with wound care for a while . Acute lower UTI -  - treat with Rocephin  given hx of enterococcus in the past for now we will add vancomycin as she has been sensitive to this in the past        await results of urine culture and adjust antibiotic coverage as needed  . Dementia without behavioral disturbance ([place]) expect some degree of sundowning while hospitalized . Dehydration we will rehydrate and follow IV fluid/possible vancomycin extravasation supportive management Other plan as per orders.  DVT prophylaxis:  SCD   Code Status:  DNR/DNI   as per family avoid over aggressive interventions I had personally discussed CODE STATUS with  Family   Family Communication:   Family not at  Bedside  plan of care was discussed on the phone with  Son,  Disposition Plan:    To  home once workup is complete and patient is stable    Would benefit from PT/OT eval prior to DC  Ordered                   Swallow eval - SLP ordered                      Nutrition    consulted                  Wound care  consulted                    Consults called: none   Admission status:  ED Disposition    ED Disposition Condition Comment   Admit  The patient  appears reasonably stabilized for admission considering the current resources, flow, and capabilities available in the ED at this time, and I doubt any other [facility] requiring further screening and/or treatment in the ED prior to admission is  present.       Obs    Level of care    tele  For 12H   Precautions:   Droplet and contact until Covid 19 testing results are back given low pre test probability would dc precautions if negative Droplet and Contact precautions  PPE: Used by the provider:   P100  eye Goggles,  Gloves     Tanishi Nault 01/19/2019, 8:39 PM    [facility]     after 2 AM please page floor coverage PA If 7AM-7PM, please contact the day team taking care of the patient using [email or web address]

## 2019-01-20 DIAGNOSIS — R29810 Facial weakness: Secondary | ICD-10-CM | POA: Diagnosis not present

## 2019-01-20 DIAGNOSIS — E890 Postprocedural hypothyroidism: Secondary | ICD-10-CM | POA: Diagnosis not present

## 2019-01-20 DIAGNOSIS — Z681 Body mass index (BMI) 19 or less, adult: Secondary | ICD-10-CM | POA: Diagnosis not present

## 2019-01-20 DIAGNOSIS — N390 Urinary tract infection, site not specified: Secondary | ICD-10-CM | POA: Diagnosis not present

## 2019-01-20 DIAGNOSIS — R443 Hallucinations, unspecified: Secondary | ICD-10-CM | POA: Diagnosis not present

## 2019-01-20 DIAGNOSIS — G9340 Encephalopathy, unspecified: Secondary | ICD-10-CM | POA: Diagnosis not present

## 2019-01-20 DIAGNOSIS — F0390 Unspecified dementia without behavioral disturbance: Secondary | ICD-10-CM | POA: Diagnosis not present

## 2019-01-20 DIAGNOSIS — K219 Gastro-esophageal reflux disease without esophagitis: Secondary | ICD-10-CM | POA: Diagnosis not present

## 2019-01-20 DIAGNOSIS — Z1159 Encounter for screening for other viral diseases: Secondary | ICD-10-CM | POA: Diagnosis not present

## 2019-01-20 DIAGNOSIS — I482 Chronic atrial fibrillation, unspecified: Secondary | ICD-10-CM | POA: Diagnosis not present

## 2019-01-20 DIAGNOSIS — G9341 Metabolic encephalopathy: Secondary | ICD-10-CM | POA: Diagnosis not present

## 2019-01-20 DIAGNOSIS — Z66 Do not resuscitate: Secondary | ICD-10-CM | POA: Diagnosis not present

## 2019-01-20 DIAGNOSIS — R64 Cachexia: Secondary | ICD-10-CM | POA: Diagnosis not present

## 2019-01-20 LAB — COMPREHENSIVE METABOLIC PANEL
ALT: 14 U/L (ref 0–44)
AST: 17 U/L (ref 15–41)
Albumin: 3 g/dL — ABNORMAL LOW (ref 3.5–5.0)
Alkaline Phosphatase: 79 U/L (ref 38–126)
Anion gap: 14 (ref 5–15)
BUN: 21 mg/dL (ref 8–23)
CO2: 17 mmol/L — ABNORMAL LOW (ref 22–32)
Calcium: 7.6 mg/dL — ABNORMAL LOW (ref 8.9–10.3)
Chloride: 112 mmol/L — ABNORMAL HIGH (ref 98–111)
Creatinine, Ser: 0.69 mg/dL (ref 0.44–1.00)
GFR calc Af Amer: >60 mL/min (ref >60)
GFR calc non Af Amer: >60 mL/min (ref >60)
Glucose, Bld: 91 mg/dL (ref 70–99)
Potassium: 3.5 mmol/L (ref 3.5–5.1)
Sodium: 143 mmol/L (ref 135–145)
Total Bilirubin: 1 mg/dL (ref 0.3–1.2)
Total Protein: 5.7 g/dL — ABNORMAL LOW (ref 6.5–8.1)

## 2019-01-20 LAB — CBC
HCT: 37.5 % (ref 36.0–46.0)
Hemoglobin: 11.5 g/dL — ABNORMAL LOW (ref 12.0–15.0)
MCH: 29.7 pg (ref 26.0–34.0)
MCHC: 30.7 g/dL (ref 30.0–36.0)
MCV: 96.9 fL (ref 80.0–100.0)
Platelets: 207 10*3/uL (ref 150–400)
RBC: 3.87 MIL/uL (ref 3.87–5.11)
RDW: 16 % — ABNORMAL HIGH (ref 11.5–15.5)
WBC: 9 10*3/uL (ref 4.0–10.5)
nRBC: 0 % (ref 0.0–0.2)

## 2019-01-20 LAB — TROPONIN I
Troponin I: 0.05 ng/mL (ref <0.03)
Troponin I: 0.04 ng/mL (ref <0.03)

## 2019-01-20 LAB — URINE CULTURE: Culture: 40000 — AB

## 2019-01-20 LAB — HEMOGLOBIN A1C
Hgb A1c MFr Bld: 5.7 % — ABNORMAL HIGH (ref 4.8–5.6)
Mean Plasma Glucose: 116.89 mg/dL

## 2019-01-20 LAB — GLUCOSE, CAPILLARY
Glucose-Capillary: 111 mg/dL — ABNORMAL HIGH (ref 70–99)
Glucose-Capillary: 95 mg/dL (ref 70–99)
Glucose-Capillary: 108 mg/dL — ABNORMAL HIGH (ref 70–99)
Glucose-Capillary: 107 mg/dL — ABNORMAL HIGH (ref 70–99)

## 2019-01-20 LAB — MAGNESIUM: Magnesium: 1.1 mg/dL — ABNORMAL LOW (ref 1.7–2.4)

## 2019-01-20 LAB — T4, FREE: Free T4: 1.5 ng/dL (ref 0.82–1.77)

## 2019-01-20 LAB — PHOSPHORUS: Phosphorus: 3.9 mg/dL (ref 2.5–4.6)

## 2019-01-20 LAB — TSH: TSH: 0.218 u[IU]/mL — ABNORMAL LOW (ref 0.350–4.500)

## 2019-01-20 MED ORDER — BOOST / RESOURCE BREEZE PO LIQD CUSTOM: 1.0000 | Freq: Three times a day (TID) | ORAL | Status: DC

## 2019-01-20 MED ORDER — ENSURE ENLIVE PO LIQD
237.0000 mL | Freq: Two times a day (BID) | ORAL | Status: DC
  Administered 2019-01-23: 237 mL via ORAL

## 2019-01-20 MED ORDER — MAGNESIUM SULFATE 4 GM/100ML IV SOLN
4.0000 g | Freq: Once | INTRAVENOUS | Status: AC
  Administered 2019-01-20: 15:00:00 4 g via INTRAVENOUS
  Filled 2019-01-20: qty 100

## 2019-01-20 MED ORDER — ADULT MULTIVITAMIN W/MINERALS CH
1.0000 | ORAL_TABLET | Freq: Every day | ORAL | Status: DC
  Administered 2019-01-21 – 2019-01-23 (×2): 1 via ORAL
  Filled 2019-01-20 (×3): qty 1

## 2019-01-20 NOTE — Progress Notes (Addendum)
PROGRESS NOTE    [name]  IZT:[id number] DOB: [birth year] DOA: 01/19/2019 PCP: Deland Pretty, MD   Brief Narrative: He is a 85 year old female with history of dementia, wheelchair-bound, atrial fibrillation, type 2 diabetes, humeral arthritis, recurrent urinary tract infection who was brought to the emergency department from home with worsening confusion, hallucinating and poor oral intake.  She was admitted in February of this year with altered mental status and was found to have Enterococcus faecalis urinary tract infection.  She was suspected to have urinary tract infection again and was admitted for IV antibiotics.  Assessment & Plan:   Active Problems:   Hypothyroidism (acquired)   Type 2 diabetes mellitus with other specified complication (HCC)   Essential hypertension   Rheumatoid arthritis involving both hands with positive rheumatoid factor (HCC)   History of gastroesophageal reflux (GERD)   Iron deficiency anemia due to chronic blood loss   Altered mental status   Acute lower UTI   Pressure injury of skin   Acute encephalopathy   Atrial fibrillation, chronic   Dementia without behavioral disturbance (HCC)   Dehydration  Acute encephalopathy: Multifactorial secondary to possible urinary tract infection, underlying dementia, decreased oral intake, dehydration. Mental status might have improved today.  This morning she was more alert, communicative.But I cud hardly understand her speech.  She is not oriented. Continue to monitor mental status.  CT head done in the emergency department was negative for any acute intracranial abnormalities. Speech therapy evaluated her and recommended full liquid diet for now.  Suspected urinary tract infection: Urine culture, blood culture has been sent.  When she was admitted last time, she had enterococcus faecalis in urine. Urinary tract infection suspected on admission.  On checking her urinalysis, it is not  highly suggestive of  UTI.  She was a started on vancomycin and ceftriaxone.  Vancomycin was started by admitted to cover Enterococcus faecalis.  I will hold vancomycin for now and continue on the ceftriaxone and wait for final urine culture report. She is hemodynamically stable.  Severe hypomagnesemia: Supplemented.  Will check levels tomorrow  Atrial fibrillation, chronic:CHA2DS2 vas score  6.  Not on anticoagulation due to risks of falls, recurrent GI bleed.  Rate is currently controlled.  Not on rate controlling medicine.  Hypothyroidism: Continue Synthyroid.  Diabetes type 2: On oral hypoglycemics at home(metformin and Tradjenta).  Continue sliding scale insulin here.Hb A1c of 5.7  Essential hypertension: Currently  stable.  Continue Norvasc  Pressure injuries: Has skin tear on her right lower leg and nonhealing wound on the left. Wound care following  Rheumatoid arthritis: Has deformities on her bilateral hands.  On prednisone,Arava  at home  Dementia: Quite advanced.  Wheelchair-bound.  Continue supportive care.  Debility/deconditioning: Seen by PT/OT and signed off saying she needs total assistance for all ADLs.  Anticipate discharge to home with home health.She lives by herself and has 24 hours caregivers. Sons check on her.      Nutrition Problem: Inadequate oral intake Etiology: (dementia, acute encephalopathy)      DVT prophylaxis:SCD Code Status: DNR Family Communication: talked to son on phone Disposition Plan: Home after improvement in the mental status, urine culture report   Consultants: None  Procedures: None  Antimicrobials:  Anti-infectives (From admission, onward)   Start     Dose/Rate Route Frequency Ordered Stop   01/20/19 2000  cefTRIAXone (ROCEPHIN) 1 g in sodium chloride 0.9 % 100 mL IVPB     1 g 200 mL/hr over 30 Minutes  Intravenous Every 24 hours 01/19/19 2301     01/20/19 1800  vancomycin (VANCOCIN) IVPB 1000 mg/200 mL premix     1,000 mg 200 mL/hr over 60  Minutes Intravenous Every 36 hours 01/19/19 2336     01/19/19 1900  cefTRIAXone (ROCEPHIN) 1 g in sodium chloride 0.9 % 100 mL IVPB     1 g 200 mL/hr over 30 Minutes Intravenous  Once 01/19/19 1855 01/19/19 2319   01/19/19 1815  vancomycin (VANCOCIN) IVPB 1000 mg/200 mL premix     1,000 mg 200 mL/hr over 60 Minutes Intravenous  Once 01/19/19 1811 01/19/19 2146      Subjective: Patient seen and examined at bedside this morning.  Hemodynamically stable.  Very debilitated, extremely frail elderly female.  Mental status might have slightly improved. She tried  to communicate but her speech was incomprehensible, not oriented.  Objective: Vitals:   01/19/19 2303 01/20/19 0211 01/20/19 0431 01/20/19 1329  BP: 132/82  137/72 (!) 141/86  Pulse: 74  85 81  Resp: 18  16 14   Temp: 98.3 F (36.8 C)   97.6 F (36.4 C)  TempSrc:    Oral  SpO2: 100%  100%   Weight:  38.5 kg    Height:  5' (1.524 m)      Intake/Output Summary (Last 24 hours) at 01/20/2019 1433 Last data filed at 01/20/2019 7858 Gross per 24 hour  Intake 387.26 ml  Output -  Net 387.26 ml   Filed Weights   01/20/19 0211  Weight: 38.5 kg    Examination:  General exam: Very debilitated, frail, elderly female ,cachetic HEENT:PERRL,Oral mucosa moist, Ear/Nose normal on gross exam Respiratory system: Bilateral equal air entry, normal vesicular breath sounds, no wheezes or crackles  Cardiovascular system: Afib,  No JVD, murmurs, rubs, gallops or clicks. No pedal edema. Gastrointestinal system: Abdomen is nondistended, soft and nontender. No organomegaly or masses felt. Normal bowel sounds heard. Central nervous system: Alert but not oriented.  Neurological examination could not be conducted due to patient's mental status  extremities: No edema, no clubbing ,no cyanosis, distal peripheral pulses palpable. Skin: Has skin tear on her right lower leg and nonhealing wound on the left.  MSK: Wasted muscles   Data Reviewed: I have  personally reviewed following labs and imaging studies  CBC: Recent Labs  Lab 01/19/19 1518 01/20/19 0623  WBC 9.5 9.0  NEUTROABS 6.8  --   HGB 13.9 11.5*  HCT 45.6 37.5  MCV 97.6 96.9  PLT 249 850   Basic Metabolic Panel: Recent Labs  Lab 01/19/19 1650 01/20/19 0623  NA 142 143  K 3.9 3.5  CL 106 112*  CO2 23 17*  GLUCOSE 137* 91  BUN 19 21  CREATININE 0.66 0.69  CALCIUM 8.4* 7.6*  MG  --  1.1*  PHOS  --  3.9   GFR: Estimated Creatinine Clearance: 29.5 mL/min (by C-G formula based on SCr of 0.69 mg/dL). Liver Function Tests: Recent Labs  Lab 01/19/19 1650 01/20/19 0623  AST 22 17  ALT 16 14  ALKPHOS 92 79  BILITOT 0.8 1.0  PROT 6.8 5.7*  ALBUMIN 3.5 3.0*   No results for input(s): LIPASE, AMYLASE in the last 168 hours. No results for input(s): AMMONIA in the last 168 hours. Coagulation Profile: Recent Labs  Lab 01/19/19 1612  INR 1.0   Cardiac Enzymes: Recent Labs  Lab 01/19/19 2005 01/20/19 0623 01/20/19 1300  TROPONINI 0.04* 0.05* 0.04*   BNP (last 3 results) No results  for input(s): PROBNP in the last 8760 hours. HbA1C: Recent Labs    01/20/19 0623  HGBA1C 5.7*   CBG: Recent Labs  Lab 01/19/19 1441 01/19/19 2319 01/20/19 0428 01/20/19 1124  GLUCAP 120* 121* 111* 95   Lipid Profile: No results for input(s): CHOL, HDL, LDLCALC, TRIG, CHOLHDL, LDLDIRECT in the last 72 hours. Thyroid Function Tests: Recent Labs    01/20/19 0623  TSH 0.218*  FREET4 1.50   Anemia Panel: No results for input(s): VITAMINB12, FOLATE, FERRITIN, TIBC, IRON, RETICCTPCT in the last 72 hours. Sepsis Labs: Recent Labs  Lab 01/19/19 1806 01/19/19 2006  LATICACIDVEN 1.5 1.3    Recent Results (from the past 240 hour(s))  Culture, blood (routine x 2)     Status: None (Preliminary result)   Collection Time: 01/19/19  6:07 PM  Result Value Ref Range Status   Specimen Description   Final    RIGHT ANTECUBITAL Performed at [facility] [address] [place], [place] [address]    Special Requests   Final    BOTTLES DRAWN AEROBIC ONLY Blood Culture results may not be optimal due to an inadequate volume of blood received in culture bottles Performed at [facility] [address], [place], [place] [address]    Culture   Final    NO GROWTH < 12 HOURS Performed at [facility] [address] [place], [place] [address]    Report Status PENDING  Incomplete  Culture, blood (routine x 2)     Status: None (Preliminary result)   Collection Time: 01/19/19  6:12 PM  Result Value Ref Range Status   Specimen Description   Final    BLOOD RIGHT ANTECUBITAL Performed at [facility] [address], [place], [place] [address]    Special Requests   Final    BOTTLES DRAWN AEROBIC AND ANAEROBIC Blood Culture adequate volume Performed at [facility] [address] [place], [place] [address]    Culture   Final    NO GROWTH < 12 HOURS Performed at [facility] [address] [place], [place] [address]    Report Status PENDING  Incomplete  SARS Coronavirus 2 [facility] Xxx order, Performed in [place] hospital lab)     Status: None   Collection Time: 01/19/19  6:56 PM  Result Value Ref Range Status   SARS Coronavirus 2 NEGATIVE NEGATIVE Final    Comment: (NOTE) If result is NEGATIVE SARS-CoV-2 target nucleic acids are NOT DETECTED. The SARS-CoV-2 RNA is generally detectable in upper and lower  respiratory specimens during the acute phase of infection. The lowest  concentration of SARS-CoV-2 viral copies this assay can detect is 250  copies / mL. A negative result does not preclude SARS-CoV-2 infection  and should not be used as the sole basis for treatment or other  patient management decisions.  A negative result may occur with  improper specimen collection / handling, submission of specimen other  than nasopharyngeal swab, presence of viral  mutation(s) within the  areas targeted by this assay, and inadequate number of viral copies  (<250 copies / mL). A negative result must be combined with clinical  observations, patient history, and epidemiological information. If result is POSITIVE SARS-CoV-2 target nucleic acids are DETECTED. The SARS-CoV-2 RNA is generally detectable in upper and lower  respiratory specimens dur ing the acute phase of infection.  Positive  results are indicative of active infection with SARS-CoV-2.  Clinical  correlation with patient history and other diagnostic information is  necessary to determine patient infection status.  Positive results do  not rule out bacterial infection or co-infection with other viruses. If result is PRESUMPTIVE POSTIVE SARS-CoV-2 nucleic acids MAY BE PRESENT.   A presumptive positive result was obtained on the submitted specimen  and confirmed on repeat testing.  While 2019 novel coronavirus  (SARS-CoV-2) nucleic acids may be present in the submitted sample  additional confirmatory testing may be necessary for epidemiological  and / or clinical management purposes  to differentiate between  SARS-CoV-2 and other Sarbecovirus currently known to infect humans.  If clinically indicated additional testing with an alternate test  methodology [phone]) is advised. The SARS-CoV-2 RNA is generally  detectable in upper and lower respiratory sp ecimens during the acute  phase of infection. The expected result is Negative. Fact Sheet for Patients:  StrictlyIdeas.no Fact Sheet for Healthcare Providers: BankingDealers.co.za This test is not yet approved or cleared by the [place] FDA and has been authorized for detection and/or diagnosis of SARS-CoV-2 by FDA under an Emergency Use Authorization (EUA).  This EUA will remain in effect (meaning this test can be used) for the duration of the COVID-19 declaration under Section 564(b)(1)  of the Act, 21 U.S.C. section 360bbb-3(b)(1), unless the authorization is terminated or revoked sooner. Performed at [facility], [place] [address] [place], [place] [address]          Radiology Studies: Ct Head Wo Contrast  Result Date: 01/19/2019 CLINICAL DATA:  Facial droop, confusion, loosen a shin EXAM: CT HEAD WITHOUT CONTRAST TECHNIQUE: Contiguous axial images were obtained from the base of the skull through the vertex without intravenous contrast. COMPARISON:  11/11/2018 FINDINGS: Brain: No evidence of acute infarction, hemorrhage, hydrocephalus, extra-axial collection or mass lesion/mass effect. Extensive periventricular and deep white matter hypodensity. Vascular: No hyperdense vessel or unexpected calcification. Skull: Normal. Negative for fracture or focal lesion. Sinuses/Orbits: No acute finding. Other: None. IMPRESSION: No acute intracranial pathology. Extensive small-vessel white matter disease in keeping with advanced patient age. Electronically Signed   By: Eddie Candle M.D.   On: 01/19/2019 16:01   Dg Chest Portable 1 View  Result Date: 01/19/2019 CLINICAL DATA:  Altered mental status, facial droop. EXAM: PORTABLE CHEST 1 VIEW COMPARISON:  Radiographs of December 22, 2018. FINDINGS: Stable cardiomediastinal silhouette. Both lungs are clear. No pneumothorax or pleural effusion is noted. The visualized skeletal structures are unremarkable. IMPRESSION: No active disease. Electronically Signed   By: Marijo Conception M.D.   On: 01/19/2019 16:15        Scheduled Meds: . amLODipine  2.5 mg Oral Daily  . docusate sodium  100 mg Oral BID  . feeding supplement (ENSURE ENLIVE)  237 mL Oral BID BM  . insulin aspart  0-9 Units Subcutaneous Q4H  . levothyroxine  100 mcg Oral Q0600  . mirabegron ER  25 mg Oral Daily  . mirtazapine  7.5 mg Oral QHS  . multivitamin with minerals  1 tablet Oral Daily  . pravastatin  20 mg Oral q1800  . predniSONE  4 mg Oral Q  breakfast   Continuous Infusions: . sodium chloride 125 mL/hr at 01/19/19 1748  . cefTRIAXone (ROCEPHIN)  IV    . magnesium sulfate 1 - 4 g bolus IVPB    . vancomycin       LOS: 0 days    Time spent: 35 mins.More than 50% of that time was spent in counseling and/or  coordination of care.      Shelly Coss, MD [facility] Pager [phone]  If 7PM-7AM, please contact night-coverage [email or web address] Password [facility] 01/20/2019, 2:33 PM

## 2019-01-20 NOTE — Progress Notes (Signed)
Writer @ bedside for PIV placement per consult. Patient with significant RUA infiltration, left extremity frail with significant bruising throughout. PIV placed in left hand x 2 attempts. Patient may be a candidate for PICC placement due limited vascular access. Primary care RN unclear on estimated LOS and will discuss with care team in AM.

## 2019-01-20 NOTE — Progress Notes (Signed)
PT Cancellation Note /Screen  Patient Details Name: KIMBERLIE CSASZAR MRN: [id number] DOB: [birth year]   Cancelled Treatment:    Reason Eval/Treat Not Completed: PT screened, no needs identified, will sign off Per admission in Feb 2020. "Per personal care aide, pt is total assist for all mobility and requires assist with all ADLs"  Pt does not appear appropriate for skilled acute PT needs at this time.  PT to sign off.   Jaleyah Longhi,KATHrine E 01/20/2019, 12:00 PM Carmelia Bake, PT, DPT Acute Rehabilitation Services Office: [phone] Pager: [phone]

## 2019-01-20 NOTE — Evaluation (Signed)
Clinical/Bedside Swallow Evaluation Patient Details  Name: [name] MRN: [id number] Date of Birth: 17-Jan-1929  Today's Date: 01/20/2019 Time: SLP Start Time (ACUTE ONLY): 23 SLP Stop Time (ACUTE ONLY): 1050 SLP Time Calculation (min) (ACUTE ONLY): 14 min  Past Medical History:  Past Medical History:  Diagnosis Date  . Acute bronchitis   . Anemia, unspecified   . Atrial fibrillation ([place])    not on AC due to falls  . Depressive disorder, not elsewhere classified   . Diverticulosis of colon (without mention of hemorrhage)   . Esophageal reflux   . Family history of malignant neoplasm of gastrointestinal tract   . Functional diarrhea   . Hiatal hernia   . History of blood transfusion    "once; related to diverticulitis" (11/29/2017)  . Other and unspecified hyperlipidemia   . Other chest pain   . Other specified cardiac dysrhythmias(427.89)   . Personal history of colonic polyps 07/17/1995   adenomatous polyps  . PONV (postoperative nausea and vomiting)   . Recurrent UTI (urinary tract infection)    "2-3 times in the last 1 1/78yr" (11/29/2017)  . Rheumatoid arthritis ([place])   . Tachycardia, unspecified   . Type II diabetes mellitus ([place])   . Unspecified adverse effect of unspecified drug, medicinal and biological substance   . Unspecified essential hypertension   . Unspecified hypothyroidism    Past Surgical History:  Past Surgical History:  Procedure Laterality Date  . ABDOMINAL HYSTERECTOMY     "partial"  . CATARACT EXTRACTION Bilateral   . FOREARM FRACTURE SURGERY Right 1940s?  . FRACTURE SURGERY    . ORIF FEMUR FRACTURE  08/30/2012   Procedure: OPEN REDUCTION INTERNAL FIXATION (ORIF) DISTAL FEMUR FRACTURE;  Surgeon: Mauri Pole, MD;  Location: [facility] ORS;  Service: Orthopedics;  Laterality: Right;  proximal femur  . THYROIDECTOMY, PARTIAL     HPI:  85 y.o. female with medical history significant for atrial fibrillation, type 2 diabetes, rheumatoid arthritis,  frequent UTI, prior TIA wheelchair-bound dementia, iron deficiency anemia presented to [facility] ED with worsening confusion, hallucinations, poor PO intake. Dx UTI, encephalopathy. Pt has had several swallow/speech evals in the past, the last in Nov 2019 with normal findings and recs for a regular diet.    Assessment / Plan / Recommendation Clinical Impression  Pt participated in clinical swallow evaluation; confused, intermittently followed commands.  Consumed limited POs due to dislike of pudding, applesauce, crackers.  Oral cavity cleaned to remove mucous coating tongue.  Pt agreeable to drinking liquids - there were no overt concerns for aspiration.  She attended to approaching spoon, removed purees, swallowed without difficulty, but refused further POs.  Oral mechanism exam normal.  Primary barrier to intake is mental status.  Pt is currently on clear liquids; allow full liquids as long as there are no medical reasons to limit other POs. RN may advance diet as MS improves.  Crush meds in puree.  No acute SLP needs are identified - pt will need assistance with self-feeding.  Our service will sign off. SLP Visit Diagnosis: Dysphagia, unspecified (R13.10)    Aspiration Risk       Diet Recommendation   full liquids - advance per RN/MD as MS improves to baseline  Medication Administration: Crushed with puree    Other  Recommendations Oral Care Recommendations: Oral care BID   Follow up Recommendations Skilled Nursing facility      Frequency and Duration            Prognosis  Swallow Study   General Date of Onset: 01/19/19 HPI: 85 y.o. female with medical history significant for atrial fibrillation, type 2 diabetes, rheumatoid arthritis, frequent UTI, prior TIA wheelchair-bound dementia, iron deficiency anemia presented to [facility] ED with worsening confusion, hallucinations, poor PO intake. Dx UTI, encephalopathy. Pt has had several swallow/speech evals in the past, the last in Nov 2019 with  normal findings and recs for a regular diet.  Type of Study: Bedside Swallow Evaluation Previous Swallow Assessment: see HPI Diet Prior to this Study: Thin liquids Temperature Spikes Noted: No Respiratory Status: Nasal cannula History of Recent Intubation: No Behavior/Cognition: Alert;Confused Oral Cavity Assessment: Dried secretions Oral Care Completed by SLP: Yes Oral Cavity - Dentition: Dentures, top;Dentures, bottom Vision: Functional for self-feeding Self-Feeding Abilities: Needs assist Patient Positioning: Upright in bed Baseline Vocal Quality: Normal Volitional Cough: Cognitively unable to elicit Volitional Swallow: Unable to elicit    Oral/Motor/Sensory Function Overall Oral Motor/Sensory Function: Within functional limits   Ice Chips Ice chips: Within functional limits   Thin Liquid Thin Liquid: Within functional limits    Nectar Thick Nectar Thick Liquid: Not tested   Honey Thick Honey Thick Liquid: Not tested   Puree Puree: Within functional limits   Solid     Solid: (pt declined)      Naziah Portee Laurice 01/20/2019,10:57 AM   Estill Bamberg L. Tivis Ringer, [place] Office number [phone] Pager [phone]

## 2019-01-20 NOTE — Consult Note (Signed)
[facility] Nurse wound consult note Remote consult with bedside RN completed.  Patient with skin tear to right lower leg and nonhealing wound to left foot.  No Unna boots in place.  Will assess wounds Wednesday when back onsite.  Foam nonadherent dressing in place at this time.   Domenic Moras MSN, RN, FNP-BC [facility] Wound, Ostomy, Continence Nurse Pager [phone]

## 2019-01-20 NOTE — Progress Notes (Signed)
OT Cancellation Note  Patient Details Name: [name] MRN: [id number] DOB: [birth year]   Cancelled Treatment:    Reason Eval/Treat Not Completed: OT screened, no needs identified, will sign off.  Noted pt intermittently followed commands with SLP.  Per last OT notes in 2/20, pt needed max to total A for all adls and mobility and lives with caregivers.  Ajla Mcgeachy 01/20/2019, 11:49 AM  Lesle Chris, OTR/L Acute Rehabilitation Services [phone] [facility] pager [phone] office 01/20/2019

## 2019-01-20 NOTE — Progress Notes (Signed)
Initial Nutrition Assessment  DOCUMENTATION CODES:   Underweight  INTERVENTION:   -Provide Ensure Enlive po BID, each supplement provides 350 kcal and 20 grams of protein -Provide Multivitamin with minerals daily  NUTRITION DIAGNOSIS:   Inadequate oral intake related to (dementia, acute encephalopathy) as evidenced by (chart review).  GOAL:   Patient will meet greater than or equal to 90% of their needs  MONITOR:   PO intake, Supplement acceptance, Labs, Weight trends, I & O's  REASON FOR ASSESSMENT:   Consult Assessment of nutrition requirement/status  ASSESSMENT:   85 y.o. female with medical history significant of fibrillation, type 2 diabetes, rheumatoid arthritis, frequent UTI, prior TIA wheelchair-bound dementia, iron deficiency anemia. Admitted for Acute encephalopathy in the setting of UTI  **RD working remotely**  Patient familiar to RD from previous admission in February 2020. Pt with history of severe malnutrition. Pt has dementia, is HOH and AMS. Pt has not been eating well since 4/24 given confusion and hallucinations. Pt now on full liquids, RD has ordered Ensure supplements and daily MVI.  SLP evaluated today 4/28: no s/s of aspiration. Main barrier to intake is mental status. Recommended diet advancement once mental status improves.  UBW is 87-88 lb. Per weight records, pt has lost 11 lb since November 2019 (11% wt loss x 6 months, significant for time frame).   Medications: Remeron tablet daily Labs reviewed: CBGs: 95-111 Low Mg  NUTRITION - FOCUSED PHYSICAL EXAM:  Unable to perform per department requirements to work remotely.  Diet Order:   Diet Order            Diet full liquid Room service appropriate? Yes; Fluid consistency: Thin  Diet effective now              EDUCATION NEEDS:   Not appropriate for education at this time  Skin:  Skin Assessment: Reviewed RN Assessment  Last BM:  PTA  Height:   Ht Readings from Last 1  Encounters:  01/20/19 5' (1.524 m)    Weight:   Wt Readings from Last 1 Encounters:  01/20/19 38.5 kg    Ideal Body Weight:  45.4 kg  BMI:  Body mass index is 16.56 kg/m.  Estimated Nutritional Needs:   Kcal:  1350-1550  Protein:  60-70g  Fluid:  1.5L/day  Clayton Bibles, MS, RD, LDN [place] Dietitian Pager: [phone] After Hours Pager: [phone]

## 2019-01-21 DIAGNOSIS — M2550 Pain in unspecified joint: Secondary | ICD-10-CM | POA: Diagnosis not present

## 2019-01-21 DIAGNOSIS — I10 Essential (primary) hypertension: Secondary | ICD-10-CM | POA: Diagnosis not present

## 2019-01-21 DIAGNOSIS — R443 Hallucinations, unspecified: Secondary | ICD-10-CM | POA: Diagnosis present

## 2019-01-21 DIAGNOSIS — Z9049 Acquired absence of other specified parts of digestive tract: Secondary | ICD-10-CM | POA: Diagnosis not present

## 2019-01-21 DIAGNOSIS — D509 Iron deficiency anemia, unspecified: Secondary | ICD-10-CM | POA: Diagnosis present

## 2019-01-21 DIAGNOSIS — R29810 Facial weakness: Secondary | ICD-10-CM | POA: Diagnosis present

## 2019-01-21 DIAGNOSIS — K219 Gastro-esophageal reflux disease without esophagitis: Secondary | ICD-10-CM | POA: Diagnosis present

## 2019-01-21 DIAGNOSIS — E11621 Type 2 diabetes mellitus with foot ulcer: Secondary | ICD-10-CM | POA: Diagnosis not present

## 2019-01-21 DIAGNOSIS — G9340 Encephalopathy, unspecified: Secondary | ICD-10-CM | POA: Diagnosis not present

## 2019-01-21 DIAGNOSIS — E890 Postprocedural hypothyroidism: Secondary | ICD-10-CM | POA: Diagnosis present

## 2019-01-21 DIAGNOSIS — D500 Iron deficiency anemia secondary to blood loss (chronic): Secondary | ICD-10-CM | POA: Diagnosis present

## 2019-01-21 DIAGNOSIS — G311 Senile degeneration of brain, not elsewhere classified: Secondary | ICD-10-CM | POA: Diagnosis not present

## 2019-01-21 DIAGNOSIS — R296 Repeated falls: Secondary | ICD-10-CM | POA: Diagnosis present

## 2019-01-21 DIAGNOSIS — N390 Urinary tract infection, site not specified: Secondary | ICD-10-CM | POA: Diagnosis present

## 2019-01-21 DIAGNOSIS — R64 Cachexia: Secondary | ICD-10-CM | POA: Diagnosis present

## 2019-01-21 DIAGNOSIS — E039 Hypothyroidism, unspecified: Secondary | ICD-10-CM | POA: Diagnosis not present

## 2019-01-21 DIAGNOSIS — I482 Chronic atrial fibrillation, unspecified: Secondary | ICD-10-CM | POA: Diagnosis present

## 2019-01-21 DIAGNOSIS — I4891 Unspecified atrial fibrillation: Secondary | ICD-10-CM | POA: Diagnosis not present

## 2019-01-21 DIAGNOSIS — F0280 Dementia in other diseases classified elsewhere without behavioral disturbance: Secondary | ICD-10-CM | POA: Diagnosis not present

## 2019-01-21 DIAGNOSIS — Z7401 Bed confinement status: Secondary | ICD-10-CM | POA: Diagnosis not present

## 2019-01-21 DIAGNOSIS — Z66 Do not resuscitate: Secondary | ICD-10-CM | POA: Diagnosis present

## 2019-01-21 DIAGNOSIS — Z1159 Encounter for screening for other viral diseases: Secondary | ICD-10-CM | POA: Diagnosis not present

## 2019-01-21 DIAGNOSIS — Z8673 Personal history of transient ischemic attack (TIA), and cerebral infarction without residual deficits: Secondary | ICD-10-CM | POA: Diagnosis not present

## 2019-01-21 DIAGNOSIS — E860 Dehydration: Secondary | ICD-10-CM | POA: Diagnosis not present

## 2019-01-21 DIAGNOSIS — F0390 Unspecified dementia without behavioral disturbance: Secondary | ICD-10-CM | POA: Diagnosis present

## 2019-01-21 DIAGNOSIS — M069 Rheumatoid arthritis, unspecified: Secondary | ICD-10-CM | POA: Diagnosis present

## 2019-01-21 DIAGNOSIS — Z87440 Personal history of urinary (tract) infections: Secondary | ICD-10-CM | POA: Diagnosis not present

## 2019-01-21 DIAGNOSIS — K5790 Diverticulosis of intestine, part unspecified, without perforation or abscess without bleeding: Secondary | ICD-10-CM | POA: Diagnosis not present

## 2019-01-21 DIAGNOSIS — G9341 Metabolic encephalopathy: Secondary | ICD-10-CM | POA: Diagnosis present

## 2019-01-21 DIAGNOSIS — Z515 Encounter for palliative care: Secondary | ICD-10-CM | POA: Diagnosis not present

## 2019-01-21 DIAGNOSIS — Z7189 Other specified counseling: Secondary | ICD-10-CM | POA: Diagnosis not present

## 2019-01-21 DIAGNOSIS — R4182 Altered mental status, unspecified: Secondary | ICD-10-CM | POA: Diagnosis not present

## 2019-01-21 DIAGNOSIS — E119 Type 2 diabetes mellitus without complications: Secondary | ICD-10-CM | POA: Diagnosis present

## 2019-01-21 DIAGNOSIS — L97518 Non-pressure chronic ulcer of other part of right foot with other specified severity: Secondary | ICD-10-CM | POA: Diagnosis not present

## 2019-01-21 DIAGNOSIS — R5381 Other malaise: Secondary | ICD-10-CM | POA: Diagnosis not present

## 2019-01-21 DIAGNOSIS — R52 Pain, unspecified: Secondary | ICD-10-CM | POA: Diagnosis not present

## 2019-01-21 DIAGNOSIS — Z681 Body mass index (BMI) 19 or less, adult: Secondary | ICD-10-CM | POA: Diagnosis not present

## 2019-01-21 DIAGNOSIS — R410 Disorientation, unspecified: Secondary | ICD-10-CM | POA: Diagnosis not present

## 2019-01-21 DIAGNOSIS — F329 Major depressive disorder, single episode, unspecified: Secondary | ICD-10-CM | POA: Diagnosis not present

## 2019-01-21 DIAGNOSIS — E785 Hyperlipidemia, unspecified: Secondary | ICD-10-CM | POA: Diagnosis present

## 2019-01-21 LAB — BASIC METABOLIC PANEL
Anion gap: 16 — ABNORMAL HIGH (ref 5–15)
BUN: 13 mg/dL (ref 8–23)
CO2: 16 mmol/L — ABNORMAL LOW (ref 22–32)
Calcium: 8.1 mg/dL — ABNORMAL LOW (ref 8.9–10.3)
Chloride: 110 mmol/L (ref 98–111)
Creatinine, Ser: 0.65 mg/dL (ref 0.44–1.00)
GFR calc Af Amer: >60 mL/min (ref >60)
GFR calc non Af Amer: >60 mL/min (ref >60)
Glucose, Bld: 143 mg/dL — ABNORMAL HIGH (ref 70–99)
Potassium: 3.8 mmol/L (ref 3.5–5.1)
Sodium: 142 mmol/L (ref 135–145)

## 2019-01-21 LAB — GLUCOSE, CAPILLARY
Glucose-Capillary: 105 mg/dL — ABNORMAL HIGH (ref 70–99)
Glucose-Capillary: 114 mg/dL — ABNORMAL HIGH (ref 70–99)
Glucose-Capillary: 121 mg/dL — ABNORMAL HIGH (ref 70–99)
Glucose-Capillary: 125 mg/dL — ABNORMAL HIGH (ref 70–99)
Glucose-Capillary: 83 mg/dL (ref 70–99)
Glucose-Capillary: 93 mg/dL (ref 70–99)
Glucose-Capillary: 95 mg/dL (ref 70–99)

## 2019-01-21 LAB — T3, FREE: T3, Free: 1.4 pg/mL — ABNORMAL LOW (ref 2.0–4.4)

## 2019-01-21 LAB — MAGNESIUM: Magnesium: 1.6 mg/dL — ABNORMAL LOW (ref 1.7–2.4)

## 2019-01-21 MED ORDER — COLLAGENASE 250 UNIT/GM EX OINT
TOPICAL_OINTMENT | Freq: Every day | CUTANEOUS | Status: DC
  Administered 2019-01-21 – 2019-01-23 (×3): via TOPICAL
  Filled 2019-01-21: qty 90

## 2019-01-21 NOTE — Progress Notes (Signed)
RN received a phone call that requested MD to update about patient's information. Notified MD as he made round. MD will call her son as primary contact on computer.

## 2019-01-21 NOTE — Consult Note (Signed)
[facility] [facility] Nurse wound consult note Reason for Consult:Nonhealing wound to right anterior lower leg.  Right third toe with scabbed wound to dorsal aspect.  Wound type: full thickness scabbed lesions, etiology unknown.  Has worn Unna boots in the past. No edema and significant confusion.  Will not reapply compression at this time.  Pressure Injury POA: NA Measurement: Right leg:  1 cm x 0.5 cm slough to wound bed Right toe:  0.5 cm scabbed lesion Wound VWA:[id number] tissue Drainage (amount, consistency, odor) scant serosanguinous  No odor Periwound:intact  No edema to bilateral legs noted today.  Dressing procedure/placement/frequency:Cleanse wounds to right leg and toe with NS and pat dry. Apply Santyl to wound bed. Cover with NS moist gauze and tape.  Change daily.  Will not follow at this time.  Please re-consult if needed.  Domenic Moras MSN, RN, FNP-BC CWON Wound, Ostomy, Continence Nurse Pager [phone]

## 2019-01-21 NOTE — Progress Notes (Signed)
PROGRESS NOTE    CHIANA WAMSER  OVF:[id number] DOB: 1929-03-05 DOA: 01/19/2019 PCP: Deland Pretty, MD   Brief Narrative: [name] is a 85 y.o. female with a history of dementia, atrial fibrillation, type 2 diabetes, arthritis, recurrent urinary tract infections.  Patient presented secondary to worsening confusions with hallucinations and poor oral intake.   Assessment & Plan:   Active Problems:   Hypothyroidism (acquired)   Type 2 diabetes mellitus with other specified complication (HCC)   Essential hypertension   Rheumatoid arthritis involving both hands with positive rheumatoid factor (HCC)   History of gastroesophageal reflux (GERD)   Iron deficiency anemia due to chronic blood loss   Altered mental status   Acute lower UTI   Pressure injury of skin   Acute encephalopathy   Atrial fibrillation, chronic   Dementia without behavioral disturbance (HCC)   Dehydration   Acute metabolic encephalopathy Thought secondary to infection as patient has had history of UTIs in the past. Also with underlying dementia which, from chart review, is severe. Evidence of chronic poor nutrition. Unsure of how acute this confusion is or if it is maybe continued decline of her dementia. Urine culture with yeast and blood culture with no growth to date. CT head negative for acute process -Discontinue ceftriaxone -Palliative care consult  Possible UTI Unlikely with negative urine culture. Empirically treated with cefti  Hypomagnesemia -Replete  Atrial fibrillation Chronic. CHA2DS2-VASc Score is 6. Not on anticoagulation secondary to fall risk. Currently rate controlled.  Hypothyroidism -Continue Synthroid  Diabetes mellitus, type 2 -Continue SSI  Essential hypertension -Continue amlodipine  Hyperlipidemia -Continue pravastatin  Dementia Patient receives 24 hour caregiver support at home. PT consulted but no recommendations secondary to patient's chronic debility.   Rheumatoid arthritis -Continue prednisone   DVT prophylaxis: SCDs Code Status:   Code Status: DNR Family Communication: Attempted to call son with no response. Disposition Plan: Discharge home when mental status near baseline and oral intake improved vs goals of care discussions   Consultants:   None  Procedures:   None  Antimicrobials:  Vancomycin  Ceftriaxone    Subjective: Patient unable to provide history secondary to mental confusion  Objective: Vitals:   01/20/19 2105 01/21/19 0424 01/21/19 0819 01/21/19 1343  BP: (!) 144/85 (!) 160/93  (!) 133/118  Pulse: 75 85  (!) 153  Resp: 18 20 20    Temp: 97.7 F (36.5 C)   98.3 F (36.8 C)  TempSrc:    Oral  SpO2: 99% 97% 93%   Weight:      Height:        Intake/Output Summary (Last 24 hours) at 01/21/2019 1521 Last data filed at 01/21/2019 1100 Gross per 24 hour  Intake 100 ml  Output -  Net 100 ml   Filed Weights   01/20/19 0211  Weight: 38.5 kg    Examination:  General exam: Appears calm and comfortable. Significant temporal wasting. Respiratory system: Clear to auscultation. Respiratory effort normal. Cardiovascular system: S1 & S2 heard, RRR. No murmurs, rubs, gallops or clicks. Gastrointestinal system: Abdomen is nondistended, soft and nontender. No organomegaly or masses felt. Normal bowel sounds heard. Central nervous system: Alert. Speaks unintelligibly Extremities: No edema. No calf tenderness Skin: No cyanosis. No rashes Psychiatry: Judgement and insight appear normal.    Data Reviewed: I have personally reviewed following labs and imaging studies  CBC: Recent Labs  Lab 01/19/19 1518 01/20/19 0623  WBC 9.5 9.0  NEUTROABS 6.8  --   HGB 13.9 11.5*  HCT 45.6 37.5  MCV 97.6 96.9  PLT 249 923   Basic Metabolic Panel: Recent Labs  Lab 01/19/19 1650 01/20/19 0623 01/21/19 0839 01/21/19 1322  NA 142 143  --  142  K 3.9 3.5  --  3.8  CL 106 112*  --  110  CO2 23 17*  --  16*   GLUCOSE 137* 91  --  143*  BUN 19 21  --  13  CREATININE 0.66 0.69  --  0.65  CALCIUM 8.4* 7.6*  --  8.1*  MG  --  1.1* 1.6*  --   PHOS  --  3.9  --   --    GFR: Estimated Creatinine Clearance: 29.5 mL/min (by C-G formula based on SCr of 0.65 mg/dL). Liver Function Tests: Recent Labs  Lab 01/19/19 1650 01/20/19 0623  AST 22 17  ALT 16 14  ALKPHOS 92 79  BILITOT 0.8 1.0  PROT 6.8 5.7*  ALBUMIN 3.5 3.0*   No results for input(s): LIPASE, AMYLASE in the last 168 hours. No results for input(s): AMMONIA in the last 168 hours. Coagulation Profile: Recent Labs  Lab 01/19/19 1612  INR 1.0   Cardiac Enzymes: Recent Labs  Lab 01/19/19 2005 01/20/19 0623 01/20/19 1300  TROPONINI 0.04* 0.05* 0.04*   BNP (last 3 results) No results for input(s): PROBNP in the last 8760 hours. HbA1C: Recent Labs    01/20/19 0623  HGBA1C 5.7*   CBG: Recent Labs  Lab 01/20/19 1620 01/20/19 2103 01/21/19 0035 01/21/19 0435 01/21/19 0737  GLUCAP 108* 107* 95 83 93   Lipid Profile: No results for input(s): CHOL, HDL, LDLCALC, TRIG, CHOLHDL, LDLDIRECT in the last 72 hours. Thyroid Function Tests: Recent Labs    01/20/19 0623 01/20/19 1300  TSH 0.218*  --   FREET4 1.50  --   T3FREE  --  1.4*   Anemia Panel: No results for input(s): VITAMINB12, FOLATE, FERRITIN, TIBC, IRON, RETICCTPCT in the last 72 hours. Sepsis Labs: Recent Labs  Lab 01/19/19 1806 01/19/19 2006  LATICACIDVEN 1.5 1.3    Recent Results (from the past 240 hour(s))  Urine culture     Status: Abnormal   Collection Time: 01/19/19  5:54 PM  Result Value Ref Range Status   Specimen Description   Final    URINE, CATHETERIZED Performed at [facility] [address] [place], [place] [address]    Special Requests   Final    NONE Performed at [facility], [place] [address], [place], [place] [address]    Culture 40,000 COLONIES/mL YEAST (A)  Final   Report Status  01/20/2019 FINAL  Final  Culture, blood (routine x 2)     Status: None (Preliminary result)   Collection Time: 01/19/19  6:07 PM  Result Value Ref Range Status   Specimen Description   Final    RIGHT ANTECUBITAL Performed at [facility] [address], [place], [place] [address]    Special Requests   Final    BOTTLES DRAWN AEROBIC ONLY Blood Culture results may not be optimal due to an inadequate volume of blood received in culture bottles Performed at [facility] [address] [place], [place] [address]    Culture   Final    NO GROWTH 2 DAYS Performed at [facility] [address] [place], [place] [address]    Report Status PENDING  Incomplete  Culture, blood (routine x 2)     Status:  None (Preliminary result)   Collection Time: 01/19/19  6:12 PM  Result Value Ref Range Status   Specimen Description   Final    BLOOD RIGHT ANTECUBITAL Performed at [facility] [address] [place], [place] [address]    Special Requests   Final    BOTTLES DRAWN AEROBIC AND ANAEROBIC Blood Culture adequate volume Performed at [facility] [address] [place], [place] [address]    Culture   Final    NO GROWTH 2 DAYS Performed at [facility] [address], [place], [place] [address]    Report Status PENDING  Incomplete  SARS Coronavirus 2 [facility] order, Performed in [place] hospital lab)     Status: None   Collection Time: 01/19/19  6:56 PM  Result Value Ref Range Status   SARS Coronavirus 2 NEGATIVE NEGATIVE Final    Comment: (NOTE) If result is NEGATIVE SARS-CoV-2 target nucleic acids are NOT DETECTED. The SARS-CoV-2 RNA is generally detectable in upper and lower  respiratory specimens during the acute phase of infection. The lowest  concentration of SARS-CoV-2 viral copies this assay can detect is 250  copies / mL. A negative result does not preclude SARS-CoV-2 infection  and  should not be used as the sole basis for treatment or other  patient management decisions.  A negative result may occur with  improper specimen collection / handling, submission of specimen other  than nasopharyngeal swab, presence of viral mutation(s) within the  areas targeted by this assay, and inadequate number of viral copies  (<250 copies / mL). A negative result must be combined with clinical  observations, patient history, and epidemiological information. If result is POSITIVE SARS-CoV-2 target nucleic acids are DETECTED. The SARS-CoV-2 RNA is generally detectable in upper and lower  respiratory specimens dur ing the acute phase of infection.  Positive  results are indicative of active infection with SARS-CoV-2.  Clinical  correlation with patient history and other diagnostic information is  necessary to determine patient infection status.  Positive results do  not rule out bacterial infection or co-infection with other viruses. If result is PRESUMPTIVE POSTIVE SARS-CoV-2 nucleic acids MAY BE PRESENT.   A presumptive positive result was obtained on the submitted specimen  and confirmed on repeat testing.  While 2019 novel coronavirus  (SARS-CoV-2) nucleic acids may be present in the submitted sample  additional confirmatory testing may be necessary for epidemiological  and / or clinical management purposes  to differentiate between  SARS-CoV-2 and other Sarbecovirus currently known to infect humans.  If clinically indicated additional testing with an alternate test  methodology [phone]) is advised. The SARS-CoV-2 RNA is generally  detectable in upper and lower respiratory sp ecimens during the acute  phase of infection. The expected result is Negative. Fact Sheet for Patients:  StrictlyIdeas.no Fact Sheet for Healthcare Providers: BankingDealers.co.za This test is not yet approved or cleared by the [place] FDA and has been  authorized for detection and/or diagnosis of SARS-CoV-2 by FDA under an Emergency Use Authorization (EUA).  This EUA will remain in effect (meaning this test can be used) for the duration of the COVID-19 declaration under Section 564(b)(1) of the Act, 21 U.S.C. section 360bbb-3(b)(1), unless the authorization is terminated or revoked sooner. Performed at [facility], [place] [address] [place], [place] [address]          Radiology Studies: Ct Head Wo Contrast  Result Date: 01/19/2019 CLINICAL DATA:  Facial droop,  confusion, loosen a shin EXAM: CT HEAD WITHOUT CONTRAST TECHNIQUE: Contiguous axial images were obtained from the base of the skull through the vertex without intravenous contrast. COMPARISON:  11/11/2018 FINDINGS: Brain: No evidence of acute infarction, hemorrhage, hydrocephalus, extra-axial collection or mass lesion/mass effect. Extensive periventricular and deep white matter hypodensity. Vascular: No hyperdense vessel or unexpected calcification. Skull: Normal. Negative for fracture or focal lesion. Sinuses/Orbits: No acute finding. Other: None. IMPRESSION: No acute intracranial pathology. Extensive small-vessel white matter disease in keeping with advanced patient age. Electronically Signed   By: Eddie Candle M.D.   On: 01/19/2019 16:01   Dg Chest Portable 1 View  Result Date: 01/19/2019 CLINICAL DATA:  Altered mental status, facial droop. EXAM: PORTABLE CHEST 1 VIEW COMPARISON:  Radiographs of December 22, 2018. FINDINGS: Stable cardiomediastinal silhouette. Both lungs are clear. No pneumothorax or pleural effusion is noted. The visualized skeletal structures are unremarkable. IMPRESSION: No active disease. Electronically Signed   By: Marijo Conception M.D.   On: 01/19/2019 16:15        Scheduled Meds: . amLODipine  2.5 mg Oral Daily  . collagenase   Topical Daily  . docusate sodium  100 mg Oral BID  . feeding supplement (ENSURE ENLIVE)  237 mL Oral BID BM   . insulin aspart  0-9 Units Subcutaneous Q4H  . levothyroxine  100 mcg Oral Q0600  . mirabegron ER  25 mg Oral Daily  . multivitamin with minerals  1 tablet Oral Daily  . pravastatin  20 mg Oral q1800  . predniSONE  4 mg Oral Q breakfast   Continuous Infusions: . sodium chloride Stopped (01/21/19 0453)  . cefTRIAXone (ROCEPHIN)  IV Stopped (01/20/19 2128)     LOS: 0 days     Cordelia Poche, MD [facility] 01/21/2019, 3:21 PM  If 7PM-7AM, please contact night-coverage [email or web address]

## 2019-01-21 NOTE — Progress Notes (Signed)
Pt removed IV after multiple attempts by IV team nurse. Per IV team nurse, the pt might be a candidate for PICC placement. No IV access at this time, RN and NT will encourage PO fluids. Will notify day shift.

## 2019-01-21 NOTE — Progress Notes (Signed)
Dressing change and wound care was performed today at 1146 as ordered.

## 2019-01-21 NOTE — TOC Transition Note (Addendum)
Transition of Care [facility]) - CM/SW Discharge Note   Patient Details  Name: [name] MRN: [id number] Date of Birth: June 04, 1929  [facility]) CM/SW Contact:  Leeroy Cha, RN Phone Number: 01/21/2019, 8:23 AM   Clinical Narrative:    hhc-RN,PT,Aide and speech therapy through [facility]. Has personaL CARE GIVERS [facility]OUGH [facility]  Final next level of care: Home w Home Health Services Barriers to Discharge: No Barriers Identified   Patient Goals and CMS Choice Patient states their goals for this hospitalization and ongoing recovery are:: just to go back to my house CMS Medicare.gov Compare Post Acute Care list provided to:: Patient Choice offered to / list presented to : Patient  Discharge Placement                       Discharge Plan and Services   Discharge Planning Services: CM Consult Post Acute Care Choice: Home Health                    HH Arranged: RN, PT, Nurse's Aide, Speech Therapy HH Agency: [facility] Date [facility] Agency Contacted: 01/21/19 Time [place]: 0820 Representative spoke with at [place]: [place] (SDOH) Interventions     Readmission Risk Interventions No flowsheet data found.

## 2019-01-22 DIAGNOSIS — Z7189 Other specified counseling: Secondary | ICD-10-CM

## 2019-01-22 DIAGNOSIS — Z515 Encounter for palliative care: Secondary | ICD-10-CM

## 2019-01-22 LAB — GLUCOSE, CAPILLARY
Glucose-Capillary: 97 mg/dL (ref 70–99)
Glucose-Capillary: 86 mg/dL (ref 70–99)
Glucose-Capillary: 108 mg/dL — ABNORMAL HIGH (ref 70–99)
Glucose-Capillary: 118 mg/dL — ABNORMAL HIGH (ref 70–99)

## 2019-01-22 MED ORDER — MORPHINE SULFATE 20 MG/5ML PO SOLN: 5.0000 mg | ORAL | 0 refills | Status: AC | PRN

## 2019-01-22 NOTE — Progress Notes (Signed)
Central monitoring called, pt had 1 min run of vtach.  Rn went in room to assess pt, pt cont pulse ox read hr-178, O2 sat - 95 on 3L [place]. Pt was alert, but resting, appeared to be in no distress.  Provider notified.

## 2019-01-22 NOTE — Consult Note (Signed)
Consultation Note Date: 01/22/2019   Patient Name: [name]  DOB: 1928/12/23  MRN: [id number]  Age / Sex: 85 y.o., female  PCP: Rhonda Pretty, MD Referring Physician: Mariel Aloe, MD  Reason for Consultation: Establishing goals of care  HPI/Patient Profile: 85 y.o. female  with past medical history of advanced dementia, a fib, T2DM, arthritis, recurrent UTIs, htn, RA, GERD and hypothyroidism admitted on 01/19/2019 with AMS, hallucinations, and poor PO intake. PMT consulted for [place].  Clinical Assessment and Goals of Care: I have reviewed medical records including EPIC notes, labs and imaging, received report from RN, and then spoke with patient's son, [name],  to discuss diagnosis prognosis, GOC, EOL wishes, disposition and options.  I introduced Palliative Medicine as specialized medical care for people living with serious illness. It focuses on providing relief from the symptoms and stress of a serious illness. The goal is to improve quality of life for both the patient and the family.  As far as functional and nutritional status, [name] shares that patient has been dependent in all ADLs. Tells he she has good days and bad days with eating. Tells me she has been this way for about 2 years. She has a caregiver with her 24/7.   We discussed her current illness and what it means in the larger context of her on-going co-morbidities.  Natural disease trajectory and expectations at EOL were discussed. We specifically discussed the progressive nature of dementia and it appeared she was in very late stages of dementia - referenced loss of function, loss of desire to eat, minimal verbalizations.  I attempted to elicit values and goals of care important to the patient.  He tells me her biggest goal is to be at home - she would never want to be placed anywhere. She wants to die at home.   The difference between aggressive medical intervention and  comfort care was considered in light of the patient's goals of care. We discussed how the patient has been receiving aggressive medical care with frequent hospitalizations. We discussed the alternative of focusing on her comfort and quality of life - and specific to her goal - keeping her at home. [name] agrees he would like to focus on these things.   Advance directives, concepts specific to code status, artifical feeding and hydration, and rehospitalization were considered and discussed. Patient  Is a documented DNR - this was referred to in conversation about limits set in her care.   Hospice and Palliative Care services outpatient were explained and offered. Discussed that patient is eligible for hospice care at home  - discussed philosophy of hospice - [name] tells me he would agree to this.   Questions and concerns were addressed. The family was encouraged to call with questions or concerns.   Primary Decision Maker NEXT OF KIN - son - [name]   SUMMARY OF RECOMMENDATIONS   - home with hospice support - order placed for case manager and message sent to Dr. Lonny Prude - continue DNR  Code Status/Advance Care Planning:  DNR   Symptom Management:   Per RN, no symptoms, has been resting quietly all day  Palliative Prophylaxis:   Aspiration, Delirium Protocol, Frequent Pain Assessment and Turn Reposition  Additional Recommendations (Limitations, Scope, Preferences):  Avoid Hospitalization  Psycho-social/Spiritual:   Desire for further Chaplaincy support:no  Additional Recommendations: Education on Hospice  Prognosis:   < 6 months  Discharge Planning: Home with Hospice      Primary Diagnoses: Present on Admission: .  Acute encephalopathy . Atrial fibrillation, chronic . Hypothyroidism (acquired) . Type 2 diabetes mellitus with other specified complication ([place]) . Essential hypertension . Rheumatoid arthritis involving both hands with positive rheumatoid factor ([place])  . Iron deficiency anemia due to chronic blood loss . Altered mental status . Pressure injury of skin . Acute lower UTI . Dementia without behavioral disturbance ([place]) . Dehydration   I have reviewed the medical record, interviewed the patient and family, and examined the patient. The following aspects are pertinent.  Past Medical History:  Diagnosis Date  . Acute bronchitis   . Anemia, unspecified   . Atrial fibrillation ([place])    not on AC due to falls  . Depressive disorder, not elsewhere classified   . Diverticulosis of colon (without mention of hemorrhage)   . Esophageal reflux   . Family history of malignant neoplasm of gastrointestinal tract   . Functional diarrhea   . Hiatal hernia   . History of blood transfusion    "once; related to diverticulitis" (11/29/2017)  . Other and unspecified hyperlipidemia   . Other chest pain   . Other specified cardiac dysrhythmias(427.89)   . Personal history of colonic polyps 07/17/1995   adenomatous polyps  . PONV (postoperative nausea and vomiting)   . Recurrent UTI (urinary tract infection)    "2-3 times in the last 1 1/66yr" (11/29/2017)  . Rheumatoid arthritis ([place])   . Tachycardia, unspecified   . Type II diabetes mellitus ([place])   . Unspecified adverse effect of unspecified drug, medicinal and biological substance   . Unspecified essential hypertension   . Unspecified hypothyroidism    Social History   Socioeconomic History  . Marital status: Widowed    Spouse name: Not on file  . Number of children: 3  . Years of education: Not on file  . Highest education level: Not on file  Occupational History    Employer: RETIRED  Social Needs  . Financial resource strain: Not on file  . Food insecurity:    Worry: Not on file    Inability: Not on file  . Transportation needs:    Medical: Not on file    Non-medical: Not on file  Tobacco Use  . Smoking status: Never Smoker  . Smokeless tobacco: Never Used  Substance and Sexual  Activity  . Alcohol use: Not Currently    Alcohol/week: 1.0 standard drinks    Types: 1 Glasses of wine per week  . Drug use: No  . Sexual activity: Never  Lifestyle  . Physical activity:    Days per week: Not on file    Minutes per session: Not on file  . Stress: Not on file  Relationships  . Social connections:    Talks on phone: Not on file    Gets together: Not on file    Attends religious service: Not on file    Active member of club or organization: Not on file    Attends meetings of clubs or organizations: Not on file    Relationship status: Not on file  Other Topics Concern  . Not on file  Social History Narrative  . Not on file   Family History  Problem Relation Age of Onset  . Colon cancer Maternal Grandmother    Scheduled Meds: . amLODipine  2.5 mg Oral Daily  . collagenase   Topical Daily  . docusate sodium  100 mg Oral BID  . feeding supplement (ENSURE ENLIVE)  237 mL Oral BID BM  . insulin aspart  0-9 Units Subcutaneous Q4H  . levothyroxine  100 mcg Oral Q0600  . mirabegron ER  25 mg Oral Daily  . multivitamin with minerals  1 tablet Oral Daily  . pravastatin  20 mg Oral q1800  . predniSONE  4 mg Oral Q breakfast   Continuous Infusions: . sodium chloride Stopped (01/21/19 0453)   PRN Meds:.acetaminophen **OR** acetaminophen, ondansetron **OR** ondansetron (ZOFRAN) IV, senna-docusate Allergies  Allergen Reactions  . Metformin And Related Diarrhea   Vital Signs: BP (!) 145/67 (BP Location: Right Arm)   Pulse 82   Temp 98 F (36.7 C) (Axillary)   Resp 19   Ht 5' (1.524 m)   Wt 38.5 kg   SpO2 94%   BMI 16.56 kg/m  Pain Scale: 0-10   Pain Score: 0-No pain   SpO2: SpO2: 94 % O2 Device:SpO2: 94 % O2 Flow Rate: .O2 Flow Rate (L/min): 3 L/min  IO: Intake/output summary: No intake or output data in the 24 hours ending 01/22/19 1134  LBM: Last BM Date: 01/21/19 Baseline Weight: Weight: 38.5 kg Most recent weight: Weight: 38.5 kg      Palliative Assessment/Data: PPS 20%    The above conversation was completed via telephone due to the visitor restrictions during the COVID-19 pandemic. Thorough chart review and discussion with necessary members of the care team was completed as part of assessment. All issues were discussed and addressed but no physical exam was performed.  Time Total: 70 minutes Greater than 50%  of this time was spent counseling and coordinating care related to the above assessment and plan.  Juel Burrow, DNP, AGNP-C Palliative Medicine Team [phone] Pager: [phone]

## 2019-01-22 NOTE — TOC Progression Note (Signed)
Transition of Care [facility]) - Progression Note    Patient Details  Name: [name] MRN: [id number] Date of Birth: 02-09-1929  [facility]) CM/SW Contact  Leeroy Cha, RN Phone Number: 01/22/2019, 12:54 PM  Clinical Narrative:    Fa ily wants patient to go home with hospice care and caregivers.  TCT-Jennifer Woody with [facility] hospice per the sons request.  Anderson Malta will see patient and contact son [name].   Expected Discharge Plan: Home w Hospice Care Barriers to Discharge: No Barriers Identified  Expected Discharge Plan and Services Expected Discharge Plan: [place]   Discharge Planning Services: CM Consult Post Acute Care Choice: [place] arrangements for the past 2 months: Single Family Home                           HH Arranged: RN, PT, Nurse's Aide, Speech Therapy HH Agency: [facility] Date [place]: 01/22/19 Time [place]: 1254 Representative spoke with at [place]: [place] (SDOH) Interventions    Readmission Risk Interventions No flowsheet data found.

## 2019-01-22 NOTE — Discharge Summary (Addendum)
Physician Discharge Summary  CASSY SPROWL TZG:[id number] DOB: 1929-08-03 DOA: 01/19/2019  PCP: Deland Pretty, MD  Admit date: 01/19/2019 Discharge date: 01/23/2019  Admitted From: Home Disposition: [place]: None Equipment/Devices: None  Discharge Condition: Hospice CODE STATUS: DNR Diet recommendation: Comfort   Brief/Interim Summary:  Admission HPI written by Toy Baker, MD   HPI: [name] is a 85 y.o. female with medical history significant of fibrillation, type 2 diabetes, rheumatoid arthritis, frequent UTI, prior TIA wheelchair-bound dementia, iron deficiency anemia    Presented with  Worsening confusion similar to prior admission.  Has been hallucinating and not eating as well.  There is been going on since Friday.  They felt that she may have had a facial droop she has not had any fevers no nausea vomiting no diarrhea Patient has not been able to take anything orally.  Last time admitted from 2/18 to 11/14/2018 for AMS due to Enterococcus faecalis UTI (sensitive to ampicillin levofloxacin nitrofurantoin and vancomycin)   Patient was started on IV Rocephin whichwas subsequently discontinued and patient placed on oral fosfomycin x1 dose. During hospitalization Hemoglobintrickling down to as low as 7.7. Patient given a dose of IV [facility] course:  Acute metabolic encephalopathy Thought secondary to infection as patient has had history of UTIs in the past. Also with underlying dementia which, from chart review, is severe. Evidence of chronic poor nutrition. Unsure of how acute this confusion is or if it is maybe continued decline of her dementia. Urine culture with yeast and blood culture with no growth to date. CT head negative for acute process. Palliative care consulted and discussed with son. Patient's wishes were to pass at home. Concern that there are no good reversible therapies at this time and likely worsening  mental status is secondary to worsening dementia. Decision made for home with hospice. Mental status improved prior to discharge but will likely continue to worsen as patient's nutrition intake continues to decline.  Possible UTI Unlikely with negative urine culture. Empirically treated with ceftriaxone which was discontinued.  Hypomagnesemia Repleted.  Atrial fibrillation Chronic. CHA2DS2-VASc Scoreis 6. Not on anticoagulation secondary to fall risk. Currently rate controlled.  Hypothyroidism Continue Synthroid  Diabetes mellitus, type 2 Continue SSI  Essential hypertension Continue amlodipine  Hyperlipidemia Continue pravastatin  Dementia Patient receives 24 hour caregiver support at home. PT consulted but no recommendations secondary to patient's chronic debility.  Rheumatoid arthritis -Continue prednisone  Discharge Diagnoses:  Active Problems:   Hypothyroidism (acquired)   Type 2 diabetes mellitus with other specified complication (HCC)   Essential hypertension   Rheumatoid arthritis involving both hands with positive rheumatoid factor (HCC)   History of gastroesophageal reflux (GERD)   Iron deficiency anemia due to chronic blood loss   Altered mental status   Acute lower UTI   Pressure injury of skin   Acute encephalopathy   Atrial fibrillation, chronic   Dementia without behavioral disturbance (HCC)   Dehydration   Goals of care, counseling/discussion   Palliative care by specialist    Discharge Instructions   Allergies as of 01/22/2019      Reactions   Metformin And Related Diarrhea      Medication List    STOP taking these medications   acetaminophen 325 MG tablet Commonly known as:  TYLENOL   amLODipine 2.5 MG tablet Commonly known as:  NORVASC   benzonatate 200 MG capsule Commonly known as:  TESSALON   calcium carbonate 500 MG chewable tablet  Commonly known as:  TUMS - dosed in mg elemental calcium   Centrum Silver 58+NIDPO  Tabs   folic acid 1 MG tablet Commonly known as:  FOLVITE   iron polysaccharides 150 MG capsule Commonly known as:  NIFEREX   magnesium oxide 400 (241.3 Mg) MG tablet Commonly known as:  MAG-OX   metFORMIN 500 MG tablet Commonly known as:  GLUCOPHAGE   pantoprazole 40 MG tablet Commonly known as:  PROTONIX   senna 8.6 MG Tabs tablet Commonly known as:  SENOKOT   Tradjenta 5 MG Tabs tablet Generic drug:  linagliptin     TAKE these medications   collagenase ointment Commonly known as:  SANTYL Apply topically daily. Apply Santyl to right 2nd toe wound Q day, then cover with moist 2X2 and foam dressing.  (Change foam dressing Q 3 days or PRN soiling.)   diclofenac sodium 1 % Gel Commonly known as:  VOLTAREN Apply 2 g topically 4 (four) times daily.   feeding supplement (ENSURE ENLIVE) Liqd Take 237 mLs by mouth 2 (two) times daily between meals.   leflunomide 20 MG tablet Commonly known as:  ARAVA Take 20 mg by mouth daily.   levothyroxine 100 MCG tablet Commonly known as:  Synthroid Take 1 tablet (100 mcg total) by mouth daily.   Livalo 2 MG Tabs Generic drug:  Pitavastatin Calcium Take 2 mg by mouth every Monday, Wednesday, and Friday.   Melatonin 2.5 MG Caps Take 1 capsule (2.5 mg total) by mouth at bedtime as needed (for sleep). What changed:  when to take this   mirtazapine 7.5 MG tablet Commonly known as:  REMERON Take 7.5 mg by mouth at bedtime.   morphine 20 MG/5ML solution Take 1.3 mLs (5.2 mg total) by mouth every 2 (two) hours as needed for pain.   Myrbetriq 25 MG Tb24 tablet Generic drug:  mirabegron ER Take 25 mg by mouth daily.   ondansetron 4 MG tablet Commonly known as:  ZOFRAN Take 1 tablet (4 mg total) by mouth every 6 (six) hours as needed for nausea.   predniSONE 1 MG tablet Commonly known as:  DELTASONE Take 4 mg by mouth daily with breakfast.       Allergies  Allergen Reactions  . Metformin And Related Diarrhea     Consultations:  Palliative care   Procedures/Studies: Ct Head Wo Contrast  Result Date: 01/19/2019 CLINICAL DATA:  Facial droop, confusion, loosen a shin EXAM: CT HEAD WITHOUT CONTRAST TECHNIQUE: Contiguous axial images were obtained from the base of the skull through the vertex without intravenous contrast. COMPARISON:  11/11/2018 FINDINGS: Brain: No evidence of acute infarction, hemorrhage, hydrocephalus, extra-axial collection or mass lesion/mass effect. Extensive periventricular and deep white matter hypodensity. Vascular: No hyperdense vessel or unexpected calcification. Skull: Normal. Negative for fracture or focal lesion. Sinuses/Orbits: No acute finding. Other: None. IMPRESSION: No acute intracranial pathology. Extensive small-vessel white matter disease in keeping with advanced patient age. Electronically Signed   By: Eddie Candle M.D.   On: 01/19/2019 16:01   Dg Chest Portable 1 View  Result Date: 01/19/2019 CLINICAL DATA:  Altered mental status, facial droop. EXAM: PORTABLE CHEST 1 VIEW COMPARISON:  Radiographs of December 22, 2018. FINDINGS: Stable cardiomediastinal silhouette. Both lungs are clear. No pneumothorax or pleural effusion is noted. The visualized skeletal structures are unremarkable. IMPRESSION: No active disease. Electronically Signed   By: Marijo Conception M.D.   On: 01/19/2019 16:15       Subjective: No issues overnight.  Discharge  Exam: Vitals:   01/22/19 1416 01/22/19 1418  BP: (!) 187/144 (!) 160/95  Pulse: 96 95  Resp:  16  Temp:  98.2 F (36.8 C)  SpO2:  97%   Vitals:   01/21/19 1957 01/22/19 0356 01/22/19 1416 01/22/19 1418  BP: 139/72 (!) 145/67 (!) 187/144 (!) 160/95  Pulse: 99 82 96 95  Resp: 18 19  16   Temp: 98.2 F (36.8 C) 98 F (36.7 C)  98.2 F (36.8 C)  TempSrc: Oral Axillary  Oral  SpO2: 98% 94%  97%  Weight:      Height:        General: Pt is alert, awake, not in acute distress Cardiovascular: RRR, S1/S2 +, no rubs, no gallops  Respiratory: CTA bilaterally, no wheezing, no rhonchi Abdominal: Soft, NT, ND, bowel sounds + Extremities: no edema, no cyanosis    The results of significant diagnostics from this hospitalization (including imaging, microbiology, ancillary and laboratory) are listed below for reference.     Microbiology: Recent Results (from the past 240 hour(s))  Urine culture     Status: Abnormal   Collection Time: 01/19/19  5:54 PM  Result Value Ref Range Status   Specimen Description   Final    URINE, CATHETERIZED Performed at [facility] [address] [place], [place] [address]    Special Requests   Final    NONE Performed at [facility], [place] [address], [place], [place] [address]    Culture 40,000 COLONIES/mL YEAST (A)  Final   Report Status 01/20/2019 FINAL  Final  Culture, blood (routine x 2)     Status: None (Preliminary result)   Collection Time: 01/19/19  6:07 PM  Result Value Ref Range Status   Specimen Description   Final    RIGHT ANTECUBITAL Performed at [facility] [address], [place], [place] [address]    Special Requests   Final    BOTTLES DRAWN AEROBIC ONLY Blood Culture results may not be optimal due to an inadequate volume of blood received in culture bottles Performed at [facility] [address] [place], [place] [address]    Culture   Final    NO GROWTH 3 DAYS Performed at [facility], [place] [address], [place], [place] [address]    Report Status PENDING  Incomplete  Culture, blood (routine x 2)     Status: None (Preliminary result)   Collection Time: 01/19/19  6:12 PM  Result Value Ref Range Status   Specimen Description   Final    BLOOD RIGHT ANTECUBITAL Performed at [facility] [address], [place], [place] [address]    Special Requests   Final    BOTTLES DRAWN AEROBIC AND ANAEROBIC Blood Culture adequate volume Performed at [facility] [address], [place], [place] [address]    Culture   Final    NO GROWTH 3 DAYS Performed at [facility], [place] [address] [place],  [address]    Report Status PENDING  Incomplete  SARS Coronavirus 2 [facility] order, Performed in [place] hospital lab)     Status: None   Collection Time: 01/19/19  6:56 PM  Result Value Ref Range Status   SARS Coronavirus 2 NEGATIVE NEGATIVE Final    Comment: (NOTE) If result is NEGATIVE SARS-CoV-2 target nucleic acids are NOT DETECTED. The SARS-CoV-2 RNA is generally detectable in upper and lower  respiratory specimens during the acute  phase of infection. The lowest  concentration of SARS-CoV-2 viral copies this assay can detect is 250  copies / mL. A negative result does not preclude SARS-CoV-2 infection  and should not be used as the sole basis for treatment or other  patient management decisions.  A negative result may occur with  improper specimen collection / handling, submission of specimen other  than nasopharyngeal swab, presence of viral mutation(s) within the  areas targeted by this assay, and inadequate number of viral copies  (<250 copies / mL). A negative result must be combined with clinical  observations, patient history, and epidemiological information. If result is POSITIVE SARS-CoV-2 target nucleic acids are DETECTED. The SARS-CoV-2 RNA is generally detectable in upper and lower  respiratory specimens dur ing the acute phase of infection.  Positive  results are indicative of active infection with SARS-CoV-2.  Clinical  correlation with patient history and other diagnostic information is  necessary to determine patient infection status.  Positive results do  not rule out bacterial infection or co-infection with other viruses. If result is PRESUMPTIVE POSTIVE SARS-CoV-2 nucleic acids MAY BE PRESENT.   A presumptive positive result was obtained on the submitted specimen  and confirmed on  repeat testing.  While 2019 novel coronavirus  (SARS-CoV-2) nucleic acids may be present in the submitted sample  additional confirmatory testing may be necessary for epidemiological  and / or clinical management purposes  to differentiate between  SARS-CoV-2 and other Sarbecovirus currently known to infect humans.  If clinically indicated additional testing with an alternate test  methodology [phone]) is advised. The SARS-CoV-2 RNA is generally  detectable in upper and lower respiratory sp ecimens during the acute  phase of infection. The expected result is Negative. Fact Sheet for Patients:  StrictlyIdeas.no Fact Sheet for Healthcare Providers: BankingDealers.co.za This test is not yet approved or cleared by the [place] FDA and has been authorized for detection and/or diagnosis of SARS-CoV-2 by FDA under an Emergency Use Authorization (EUA).  This EUA will remain in effect (meaning this test can be used) for the duration of the COVID-19 declaration under Section 564(b)(1) of the Act, 21 U.S.C. section 360bbb-3(b)(1), unless the authorization is terminated or revoked sooner. Performed at [facility], [place] [address], [place], [place] [address]      Labs: BNP (last 3 results) No results for input(s): BNP in the last 8760 hours. Basic Metabolic Panel: Recent Labs  Lab 01/19/19 1650 01/20/19 0623 01/21/19 0839 01/21/19 1322  NA 142 143  --  142  K 3.9 3.5  --  3.8  CL 106 112*  --  110  CO2 23 17*  --  16*  GLUCOSE 137* 91  --  143*  BUN 19 21  --  13  CREATININE 0.66 0.69  --  0.65  CALCIUM 8.4* 7.6*  --  8.1*  MG  --  1.1* 1.6*  --   PHOS  --  3.9  --   --    Liver Function Tests: Recent Labs  Lab 01/19/19 1650 01/20/19 0623  AST 22 17  ALT 16 14  ALKPHOS 92 79  BILITOT 0.8 1.0  PROT 6.8 5.7*  ALBUMIN 3.5 3.0*   No results for input(s): LIPASE, AMYLASE in the last 168 hours. No results  for input(s): AMMONIA in the last 168 hours. CBC: Recent Labs  Lab 01/19/19 1518 01/20/19 0623  WBC 9.5 9.0  NEUTROABS 6.8  --   HGB 13.9 11.5*  HCT 45.6  37.5  MCV 97.6 96.9  PLT 249 207   Cardiac Enzymes: Recent Labs  Lab 01/19/19 2005 01/20/19 0623 01/20/19 1300  TROPONINI 0.04* 0.05* 0.04*   BNP: Invalid input(s): POCBNP CBG: Recent Labs  Lab 01/21/19 1955 01/21/19 2357 01/22/19 0400 01/22/19 0728 01/22/19 1121  GLUCAP 121* 105* 97 86 108*   D-Dimer No results for input(s): DDIMER in the last 72 hours. Hgb A1c Recent Labs    01/20/19 0623  HGBA1C 5.7*   Lipid Profile No results for input(s): CHOL, HDL, LDLCALC, TRIG, CHOLHDL, LDLDIRECT in the last 72 hours. Thyroid function studies Recent Labs    01/20/19 0623 01/20/19 1300  TSH 0.218*  --   T3FREE  --  1.4*   Anemia work up No results for input(s): VITAMINB12, FOLATE, FERRITIN, TIBC, IRON, RETICCTPCT in the last 72 hours. Urinalysis    Component Value Date/Time   COLORURINE YELLOW 01/19/2019 1504   APPEARANCEUR TURBID (A) 01/19/2019 1504   LABSPEC 1.017 01/19/2019 1504   PHURINE 5.0 01/19/2019 1504   GLUCOSEU NEGATIVE 01/19/2019 1504   HGBUR SMALL (A) 01/19/2019 1504   HGBUR negative 03/31/2009 1047   BILIRUBINUR NEGATIVE 01/19/2019 1504   KETONESUR 20 (A) 01/19/2019 1504   PROTEINUR 100 (A) 01/19/2019 1504   UROBILINOGEN 0.2 08/28/2012 0543   NITRITE NEGATIVE 01/19/2019 1504   LEUKOCYTESUR MODERATE (A) 01/19/2019 1504   Sepsis Labs Invalid input(s): PROCALCITONIN,  WBC,  LACTICIDVEN Microbiology Recent Results (from the past 240 hour(s))  Urine culture     Status: Abnormal   Collection Time: 01/19/19  5:54 PM  Result Value Ref Range Status   Specimen Description   Final    URINE, CATHETERIZED Performed at [facility], [place] [address] [place], [place] [address]    Special Requests   Final    NONE Performed at [facility], [place] [address] [place], [place] [address]    Culture 40,000 COLONIES/mL YEAST (A)  Final   Report Status 01/20/2019 FINAL  Final  Culture, blood (routine x 2)     Status: None (Preliminary result)   Collection Time: 01/19/19  6:07 PM  Result Value Ref Range Status   Specimen Description   Final    RIGHT ANTECUBITAL Performed at [facility] [address], [place], [place] [address]    Special Requests   Final    BOTTLES DRAWN AEROBIC ONLY Blood Culture results may not be optimal due to an inadequate volume of blood received in culture bottles Performed at [facility] [address], [place], [place] [address]    Culture   Final    NO GROWTH 3 DAYS Performed at [facility], [place] [address] [place], [place] [address]    Report Status PENDING  Incomplete  Culture, blood (routine x 2)     Status: None (Preliminary result)   Collection Time: 01/19/19  6:12 PM  Result Value Ref Range Status   Specimen Description   Final    BLOOD RIGHT ANTECUBITAL Performed at [facility] [address], [place], [place] [address]    Special Requests   Final    BOTTLES DRAWN AEROBIC AND ANAEROBIC Blood Culture adequate volume Performed at [facility] [address], [place], [place] [address]    Culture   Final    NO GROWTH 3 DAYS Performed at [facility], [place] [address] [place], [place] [address]    Report Status PENDING  Incomplete  SARS Coronavirus 2 [facility] order, Performed in [facility] hospital lab)     Status: None   Collection Time: 01/19/19  6:56 PM  Result Value Ref Range Status   SARS Coronavirus 2 NEGATIVE NEGATIVE Final    Comment: (NOTE) If result is NEGATIVE SARS-CoV-2 target nucleic acids are NOT DETECTED. The SARS-CoV-2 RNA is generally detectable in upper and lower  respiratory specimens during the acute phase of infection. The lowest  concentration of SARS-CoV-2 viral copies this assay can  detect is 250  copies / mL. A negative result does not preclude SARS-CoV-2 infection  and should not be used as the sole basis for treatment or other  patient management decisions.  A negative result may occur with  improper specimen collection / handling, submission of specimen other  than nasopharyngeal swab, presence of viral mutation(s) within the  areas targeted by this assay, and inadequate number of viral copies  (<250 copies / mL). A negative result must be combined with clinical  observations, patient history, and epidemiological information. If result is POSITIVE SARS-CoV-2 target nucleic acids are DETECTED. The SARS-CoV-2 RNA is generally detectable in upper and lower  respiratory specimens dur ing the acute phase of infection.  Positive  results are indicative of active infection with SARS-CoV-2.  Clinical  correlation with patient history and other diagnostic information is  necessary to determine patient infection status.  Positive results do  not rule out bacterial infection or co-infection with other viruses. If result is PRESUMPTIVE POSTIVE SARS-CoV-2 nucleic acids MAY BE PRESENT.   A presumptive positive result was obtained on the submitted specimen  and confirmed on repeat testing.  While 2019 novel coronavirus  (SARS-CoV-2) nucleic acids may be present in the submitted sample  additional confirmatory testing may be necessary for epidemiological  and / or clinical management purposes  to differentiate between  SARS-CoV-2 and other Sarbecovirus currently known to infect humans.  If clinically indicated additional testing with an alternate test  methodology [phone]) is advised. The SARS-CoV-2 RNA is generally  detectable in upper and lower respiratory sp ecimens during the acute  phase of infection. The expected result is Negative. Fact Sheet for Patients:  StrictlyIdeas.no Fact Sheet for Healthcare Providers:  BankingDealers.co.za This test is not yet approved or cleared by the [place] FDA and has been authorized for detection and/or diagnosis of SARS-CoV-2 by FDA under an Emergency Use Authorization (EUA).  This EUA will remain in effect (meaning this test can be used) for the duration of the COVID-19 declaration under Section 564(b)(1) of the Act, 21 U.S.C. section 360bbb-3(b)(1), unless the authorization is terminated or revoked sooner. Performed at [facility], [place] [address], [place], [place] [address]     SIGNED:   Cordelia Poche, MD [facility] 01/22/2019, 4:45 PM

## 2019-01-22 NOTE — Progress Notes (Addendum)
Manufacturing engineer [name]) Hospice  Received referral from RN Case Manager [name], for hospice services at home once discharged.  Patient and chart under review by [facility] physician at this time and eligibility is pending.  Spoke with son [name], he confirmed interest in hospice services.  He is not local and deferred a lot of information to her caregiver [name] [phone]).  Pt will need ambulance transport home.  [name] asks that the hospital coordinate this with [name], as she will be the one at the home with the pt.  No DME needs at this time.  Please send completed DNR home with the pt, as well as any comfort medications that might be needed prior to hospice services starting.  [facility] referral center aware of the above. Completed discharge summary should be faxed to [phone].  Above information shared with Rhonda Marker RN Case Manager.  Thank you for this referral, Venia Carbon RN, BSN, [facility] Liaison (in [place] under [facility]) [phone] (main #)  **Update, [facility] will send a RN out at 230 tomorrow.  Spoke with [name] (caregiver) who confirmed they will have a caregiver to receive the pt around 130-2.  **Update, Pt is active with [facility] who also offers hospice home care.  They will be continuing her care at home with their hospice services.  Thank you!

## 2019-01-23 LAB — GLUCOSE, CAPILLARY
Glucose-Capillary: 103 mg/dL — ABNORMAL HIGH (ref 70–99)
Glucose-Capillary: 122 mg/dL — ABNORMAL HIGH (ref 70–99)
Glucose-Capillary: 99 mg/dL (ref 70–99)

## 2019-01-23 NOTE — Progress Notes (Signed)
Patient seen and examined today. No changes to recommendations for discharge. Mental status improved today. Still not eating much. Stable for discharge home. Discharge summary discharge date amended.  Cordelia Poche, MD [facility] 01/23/2019, 9:23 AM

## 2019-01-23 NOTE — Consult Note (Addendum)
   [facility] CM Inpatient Consult   01/23/2019  [name] 22-Sep-1929 [id number]   Patient chart reviewed under [place] registry for high readmission risk score, 28%, and multiple hospitalizations. Chart review reveals disposition is home with home hospice care. No [facility] CM needs.  Netta Cedars, MSN, [facility] Liaison Nurse Mobile Phone [phone]  Toll free office [phone]

## 2019-01-23 NOTE — TOC Progression Note (Signed)
Transition of Care [facility]) - Progression Note    Patient Details  Name: [name] MRN: [id number] Date of Birth: 1929/01/01  [facility] CM/SW Contact  Leeroy Cha, RN Phone Number: 01/23/2019, 9:25 AM  Clinical Narrative:    [facility] called for pickup and transport today at 1 pm to be at home 1;;30-2:00. [facility] will do the home hospice care giver will be at the house today around 1:30   Expected Discharge Plan: Home w Hospice Care Barriers to Discharge: No Barriers Identified  Expected Discharge Plan and Services Expected Discharge Plan: [place]   Discharge Planning Services: CM Consult Post Acute Care Choice: [place] arrangements for the past 2 months: Single Family Home Expected Discharge Date: 01/22/19                         HH Arranged: RN, PT, Nurse's Aide, Speech Therapy HH Agency: Hospice and [place] Date [place]: 01/22/19 Time [place]: 1254 Representative spoke with at [place]: [place] (SDOH) Interventions    Readmission Risk Interventions No flowsheet data found.

## 2019-01-24 LAB — CULTURE, BLOOD (ROUTINE X 2)
Culture: NO GROWTH
Culture: NO GROWTH
Special Requests: ADEQUATE

## 2019-02-23 DEATH — deceased

## 2020-02-15 IMAGING — CT CT HEAD W/O CM
4 series · 17 of 47 positions shown, 19 images · non-contrast
Comparison: 06/03/2018

CLINICAL DATA: Altered mental status, weaker today, leading to the
RIGHT, history atrial fibrillation, type II diabetes mellitus,
hypertension

EXAM:
CT HEAD WITHOUT CONTRAST
TECHNIQUE: Contiguous axial images were obtained from the base of the skull
through the vertex without intravenous contrast.

[Series 3: head without · axial · non-contrast · 0.39mm/px · z∈[-51,+64]mm · 7 of 31 slices shown, 9 images]
[im 4/31  brain]
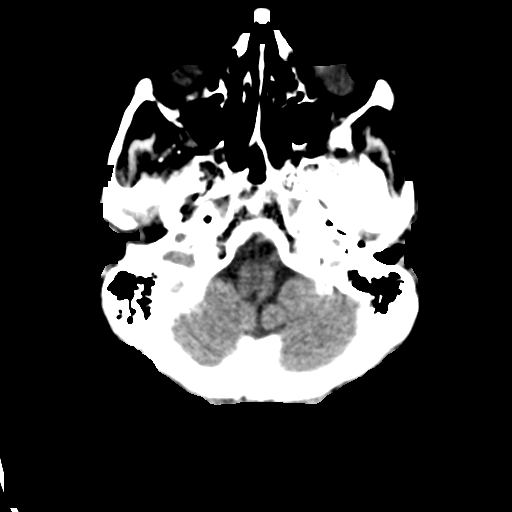
[im 4/31  bone]
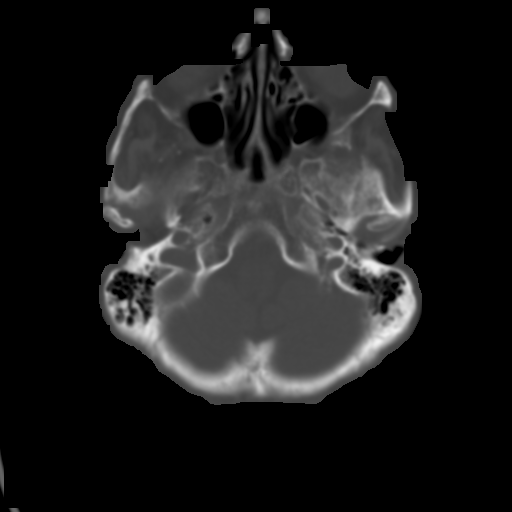
[im 8/31  brain]
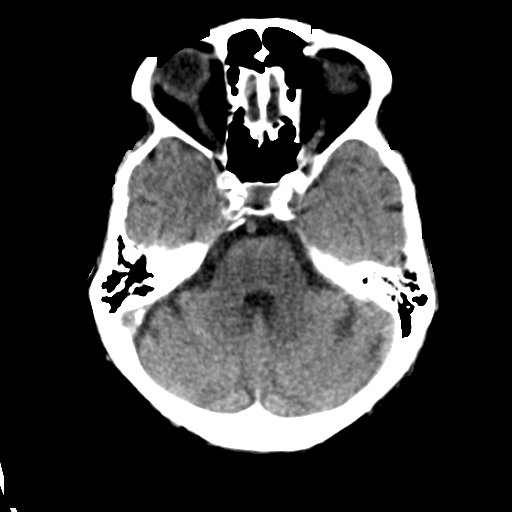
[im 12/31  brain]
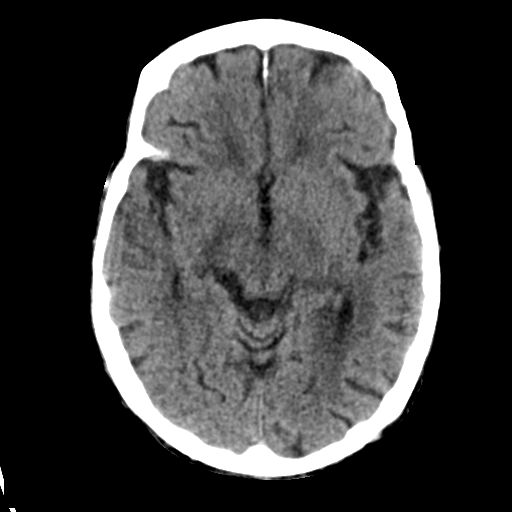
[im 16/31  brain]
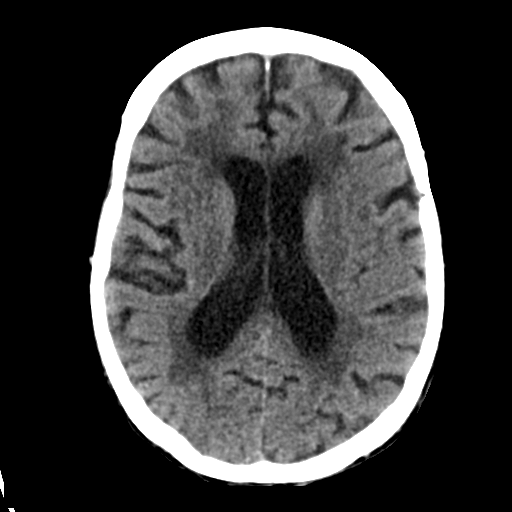
[im 19/31  brain]
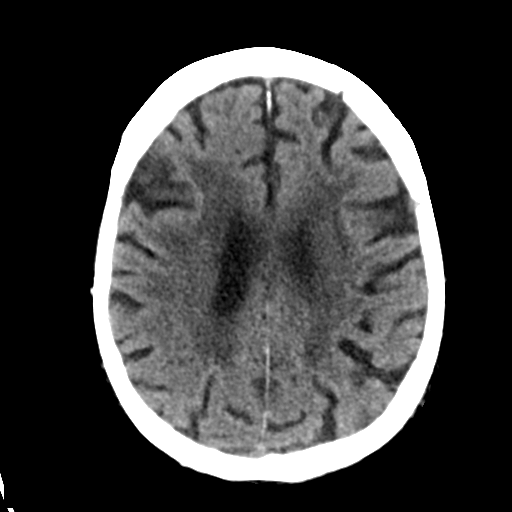
[im 19/31  bone]
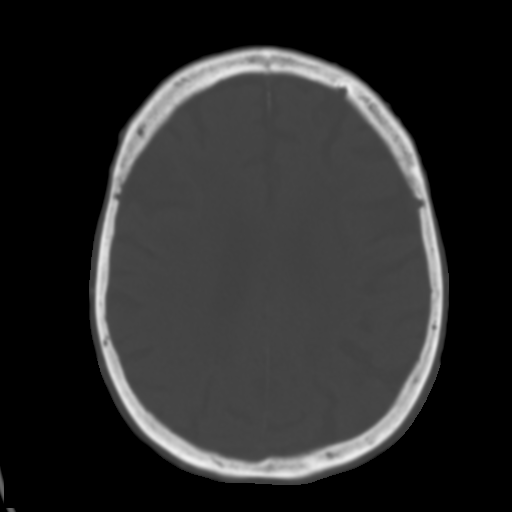
[im 23/31  brain]
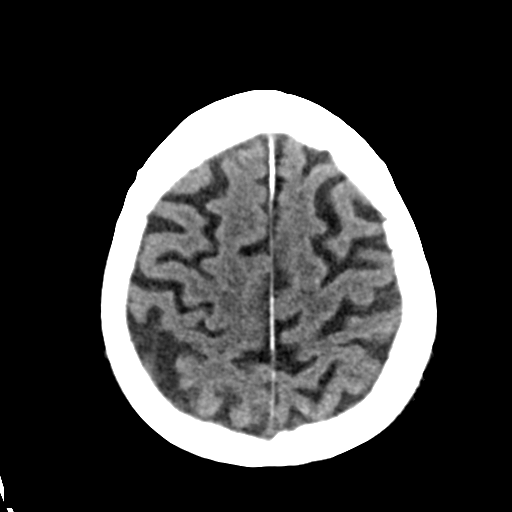
[im 27/31  brain]
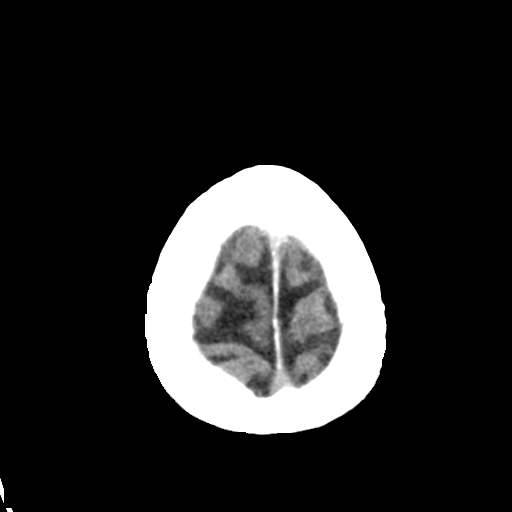

[Series 4: head bone · axial · 0.39mm/px · z∈[-52,+2]mm · 4 of 77 slices shown]
[im 8/77  bone]
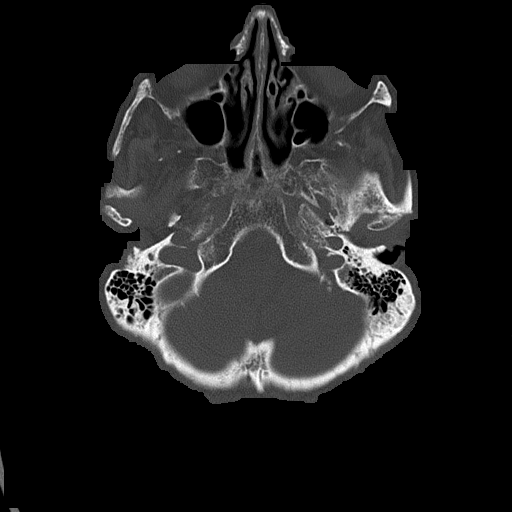
[im 16/77  bone]
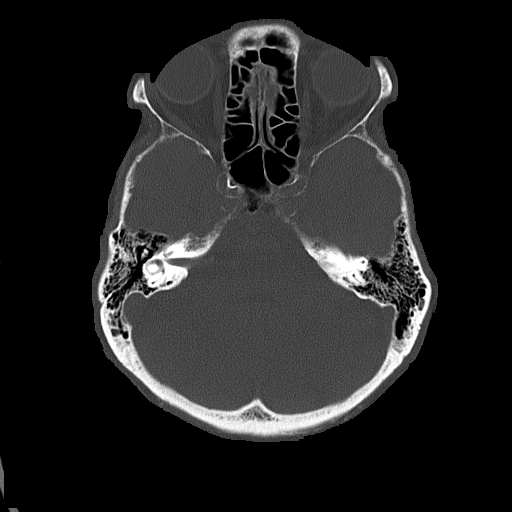
[im 23/77  bone]
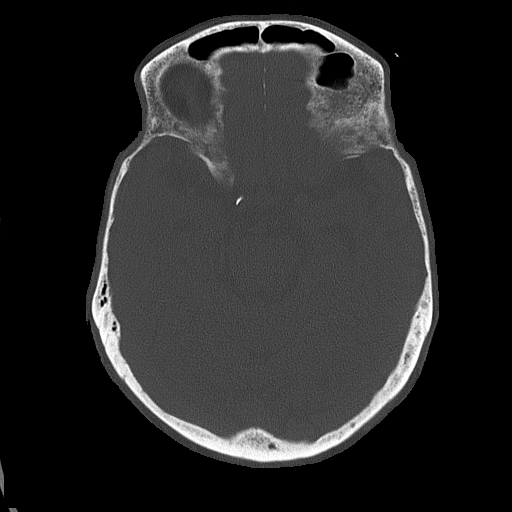
[im 35/77  bone]
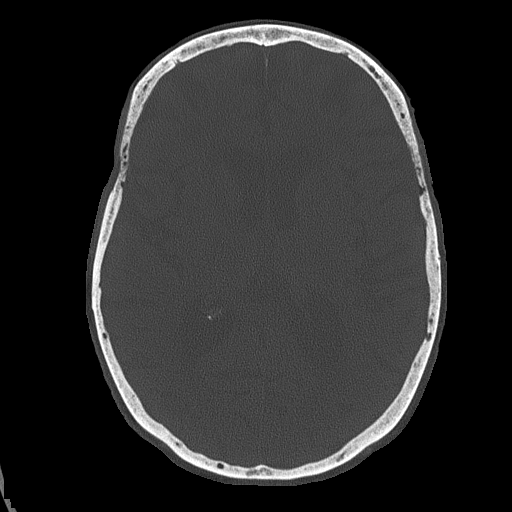

[Series 5: head without cor · coronal · non-contrast · 0.29mm/px · 3 of 65 slices shown]
[im 22/65  brain]
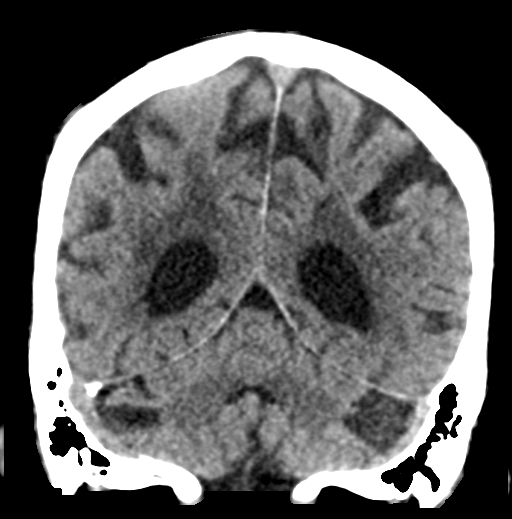
[im 29/65  brain]
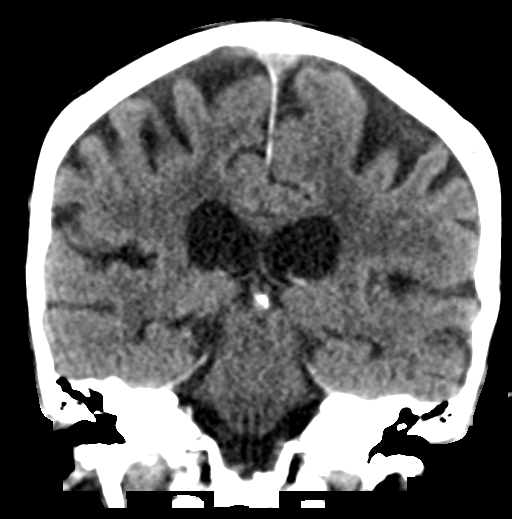
[im 36/65  brain]
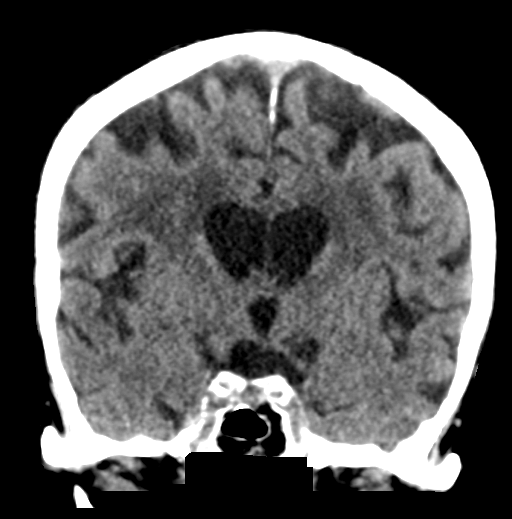

[Series 6: head without sag · sagittal · non-contrast · 0.30mm/px · 3 of 54 slices shown]
[im 18/54  brain]
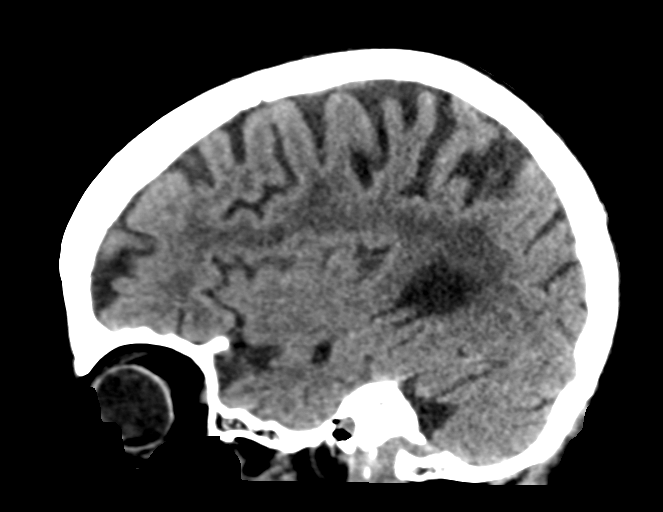
[im 27/54  brain]
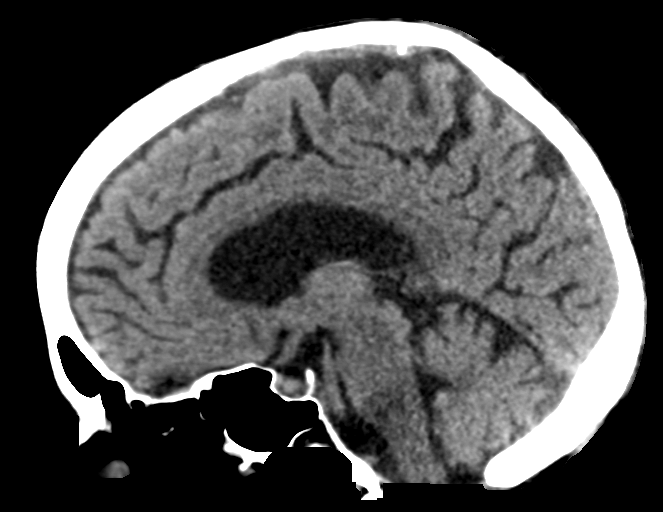
[im 36/54  brain]
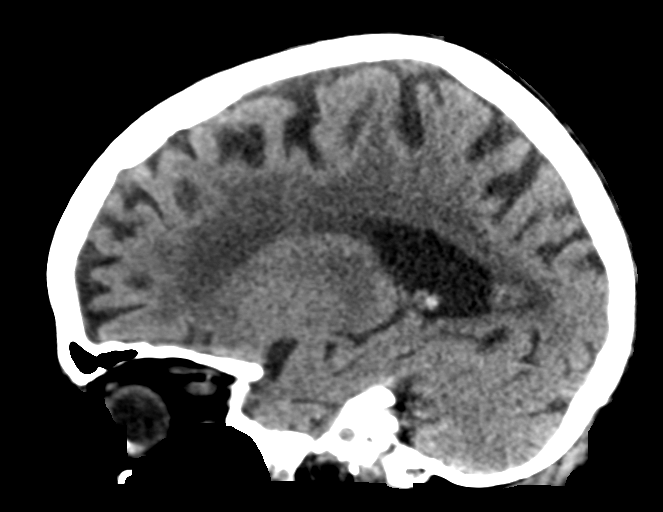

[17 of 47 positions shown; findings below may reference images not displayed]

FINDINGS: Brain: Generalized atrophy. Normal ventricular morphology. No
midline shift or mass effect. Small vessel chronic ischemic changes
of deep cerebral white matter. No intracranial hemorrhage, mass
lesion, evidence of acute infarction, or extra-axial fluid
collection.

Vascular: Atherosclerotic calcifications of internal carotid and
vertebral arteries at skull base

Skull: Demineralized but intact

Sinuses/Orbits: Clear

Other: N/A
IMPRESSION: Atrophy with small vessel chronic ischemic changes of deep cerebral
white matter.

No acute intracranial abnormalities.
